# Patient Record
Sex: Female | Born: 1955
Health system: Southern US, Community
[De-identification: ages and names within clinical notes are randomized; demographics above are authoritative.]

## PROBLEM LIST (undated history)

## (undated) DIAGNOSIS — T7840XA Allergy, unspecified, initial encounter: Secondary | ICD-10-CM

## (undated) DIAGNOSIS — Z923 Personal history of irradiation: Secondary | ICD-10-CM

## (undated) DIAGNOSIS — K219 Gastro-esophageal reflux disease without esophagitis: Secondary | ICD-10-CM

## (undated) DIAGNOSIS — M1990 Unspecified osteoarthritis, unspecified site: Secondary | ICD-10-CM

## (undated) DIAGNOSIS — C50919 Malignant neoplasm of unspecified site of unspecified female breast: Secondary | ICD-10-CM

## (undated) DIAGNOSIS — C50911 Malignant neoplasm of unspecified site of right female breast: Secondary | ICD-10-CM

## (undated) DIAGNOSIS — Z8489 Family history of other specified conditions: Secondary | ICD-10-CM

## (undated) DIAGNOSIS — D649 Anemia, unspecified: Secondary | ICD-10-CM

## (undated) DIAGNOSIS — R21 Rash and other nonspecific skin eruption: Secondary | ICD-10-CM

## (undated) DIAGNOSIS — Z9221 Personal history of antineoplastic chemotherapy: Secondary | ICD-10-CM

## (undated) DIAGNOSIS — Z853 Personal history of malignant neoplasm of breast: Secondary | ICD-10-CM

## (undated) DIAGNOSIS — I429 Cardiomyopathy, unspecified: Secondary | ICD-10-CM

## (undated) DIAGNOSIS — R0989 Other specified symptoms and signs involving the circulatory and respiratory systems: Secondary | ICD-10-CM

## (undated) DIAGNOSIS — Z98811 Dental restoration status: Secondary | ICD-10-CM

## (undated) DIAGNOSIS — F419 Anxiety disorder, unspecified: Secondary | ICD-10-CM

## (undated) DIAGNOSIS — R011 Cardiac murmur, unspecified: Secondary | ICD-10-CM

## (undated) HISTORY — DX: Allergy, unspecified, initial encounter: T78.40XA

## (undated) HISTORY — PX: AUGMENTATION MAMMAPLASTY: SUR837

## (undated) HISTORY — PX: MASTECTOMY: SHX3

## (undated) HISTORY — PX: COLONOSCOPY: SHX174

## (undated) HISTORY — DX: Unspecified osteoarthritis, unspecified site: M19.90

## (undated) HISTORY — PX: REDUCTION MAMMAPLASTY: SUR839

---

## 1998-10-08 ENCOUNTER — Other Ambulatory Visit: Admission: RE | Admit: 1998-10-08 | Discharge: 1998-10-08 | Payer: Self-pay | Admitting: Obstetrics & Gynecology

## 2000-06-28 ENCOUNTER — Other Ambulatory Visit: Admission: RE | Admit: 2000-06-28 | Discharge: 2000-06-28 | Payer: Self-pay | Admitting: Obstetrics & Gynecology

## 2001-08-10 ENCOUNTER — Other Ambulatory Visit: Admission: RE | Admit: 2001-08-10 | Discharge: 2001-08-10 | Payer: Self-pay | Admitting: Obstetrics & Gynecology

## 2003-01-11 ENCOUNTER — Other Ambulatory Visit: Admission: RE | Admit: 2003-01-11 | Discharge: 2003-01-11 | Payer: Self-pay | Admitting: Obstetrics and Gynecology

## 2003-04-21 HISTORY — PX: BREAST ENHANCEMENT SURGERY: SHX7

## 2004-01-17 ENCOUNTER — Other Ambulatory Visit: Admission: RE | Admit: 2004-01-17 | Discharge: 2004-01-17 | Payer: Self-pay | Admitting: Obstetrics and Gynecology

## 2005-03-24 ENCOUNTER — Other Ambulatory Visit: Admission: RE | Admit: 2005-03-24 | Discharge: 2005-03-24 | Payer: Self-pay | Admitting: Obstetrics and Gynecology

## 2008-07-18 ENCOUNTER — Encounter: Admission: RE | Admit: 2008-07-18 | Discharge: 2008-07-18 | Payer: Self-pay | Admitting: Obstetrics and Gynecology

## 2010-05-11 ENCOUNTER — Encounter: Payer: Self-pay | Admitting: Obstetrics and Gynecology

## 2011-01-20 ENCOUNTER — Telehealth: Payer: Self-pay | Admitting: Internal Medicine

## 2011-01-20 NOTE — Telephone Encounter (Signed)
Spoke with patient and she states she has has bright, red blood with and without stools from rectum x 3 days. Denies constipation, diarrhea, pain, bloating, change in bowel habits. Offered patient an OV with Willette Cluster, NP tomorrow but patient cannot come. Scheduled on 01/22/11 at 8:30 AM with Willette Cluster, NP.Last colon- 08/24/03- diverticulosis, hemorrhoids.

## 2011-01-20 NOTE — Telephone Encounter (Signed)
I don't see anything in terms of procedure or [facility] records. OK to see paula.

## 2011-01-21 ENCOUNTER — Encounter: Payer: Self-pay | Admitting: Internal Medicine

## 2011-01-22 ENCOUNTER — Encounter: Payer: Self-pay | Admitting: Nurse Practitioner

## 2011-01-22 ENCOUNTER — Ambulatory Visit (INDEPENDENT_AMBULATORY_CARE_PROVIDER_SITE_OTHER): Payer: BC Managed Care – PPO | Admitting: Nurse Practitioner

## 2011-01-22 VITALS — BP 88/70 | HR 88 | Ht 63.0 in | Wt 132.0 lb

## 2011-01-22 DIAGNOSIS — K648 Other hemorrhoids: Secondary | ICD-10-CM

## 2011-01-22 DIAGNOSIS — R195 Other fecal abnormalities: Secondary | ICD-10-CM

## 2011-01-22 DIAGNOSIS — K625 Hemorrhage of anus and rectum: Secondary | ICD-10-CM

## 2011-01-22 MED ORDER — HYDROCORTISONE ACETATE 25 MG RE SUPP: 25.0000 mg | Freq: Every day | RECTAL | Status: AC

## 2011-01-22 NOTE — Progress Notes (Signed)
01/22/2011 Patricia Chandler [id number] Jan 02, 1956   HISTORY OF PRESENT ILLNESS: Patient is a 55 year old female who has been a colonoscopy 2005 with findings of diverticulosis and internal hemorrhoids only. Patient started Valtrex last week, subsequently developed loose stool followed by some rectal bleeding. No abdominal pain or rectal pain. Since she made this appointment patient actually hasn't had any further bleeding and her bowel movements seem to be normalizing.   Past Medical History  Diagnosis Date  . Genital herpes   . Diverticulosis   . Internal hemorrhoid    Past Surgical History  Procedure Date  . Breast lumpectomy     right    reports that she has never smoked. She has never used smokeless tobacco. She reports that she drinks about 7 ounces of alcohol per week. She reports that she does not use illicit drugs. family history includes Breast cancer in her maternal aunt and paternal grandmother; Cancer in her paternal grandmother; Hypertension in her father; and Stroke in her father. No Known Allergies    Outpatient Encounter Prescriptions as of 01/22/2011  Medication Sig Dispense Refill  . valACYclovir (VALTREX) 500 MG tablet Take 500 mg by mouth 2 (two) times daily.           REVIEW OF SYSTEMS  : All other systems reviewed and negative except where noted in the History of Present Illness.   PHYSICAL EXAM: General: Well developed white female in no acute distress Head: Normocephalic and atraumatic Eyes:  sclerae anicteric,conjunctive pink. Ears: Normal auditory acuity Mouth: No deformity or lesions Neck: Supple, no masses.  Lungs: Clear throughout to auscultation Heart: Regular rate and rhythm; no murmurs heard Abdomen: Soft, non distended, nontender. No masses or hepatomegaly noted. Normal Bowel sounds Rectal: No external lesions. Inflamed internal hemorrhoid of anoscopy Musculoskeletal: Symmetrical with no gross deformities  Skin: No lesions on visible  extremities Extremities: No edema or deformities noted Neurological: Alert oriented x 4, grossly nonfocal Cervical Nodes:  No significant cervical adenopathy Psychological:  Alert and cooperative. Normal mood and affect  ASSESSMENT AND PLAN;

## 2011-01-22 NOTE — Patient Instructions (Signed)
Your prescription for Anusol [facility] suppositories has been given to you. Take Imodium 1-2 tablets by mouth once daily as needed.  Call us back if you have any further bleeding or new abdominal symptoms.

## 2011-01-26 ENCOUNTER — Encounter: Payer: Self-pay | Admitting: Nurse Practitioner

## 2011-01-26 DIAGNOSIS — R195 Other fecal abnormalities: Secondary | ICD-10-CM | POA: Insufficient documentation

## 2011-01-26 DIAGNOSIS — K648 Other hemorrhoids: Secondary | ICD-10-CM | POA: Insufficient documentation

## 2011-01-26 NOTE — Assessment & Plan Note (Addendum)
Loose stools after beginning Valtrex. No recent antibiotics. Her stools seem to be normalizing now. If she has recurrent loose stools we will need to obtain stool studies.

## 2011-01-26 NOTE — Assessment & Plan Note (Addendum)
Suspect the loose stools (improving) aggravated internal hemorrhoids. Begin Anusol HC suppositories. She will let us know if there is a recurrence of loose stools.

## 2011-01-26 NOTE — Progress Notes (Signed)
Reviewed and agree with management. Seann Genther D. Karis Rilling, M.D., FACG  

## 2011-01-26 NOTE — Assessment & Plan Note (Addendum)
Most likely secondary to internal hemorrhoids seen on anoscopy.  Begin Anusol suppositories.  Will call patient next week to get an update.

## 2012-01-31 ENCOUNTER — Encounter (HOSPITAL_COMMUNITY): Payer: Self-pay | Admitting: Emergency Medicine

## 2012-01-31 ENCOUNTER — Emergency Department (HOSPITAL_COMMUNITY)
Admission: EM | Admit: 2012-01-31 | Discharge: 2012-01-31 | Disposition: A | Discharge status: Home / Self Care | Payer: Self-pay | Source: Home / Self Care

## 2012-01-31 DIAGNOSIS — J40 Bronchitis, not specified as acute or chronic: Secondary | ICD-10-CM

## 2012-01-31 DIAGNOSIS — R0982 Postnasal drip: Secondary | ICD-10-CM

## 2012-01-31 DIAGNOSIS — J45909 Unspecified asthma, uncomplicated: Secondary | ICD-10-CM

## 2012-01-31 DIAGNOSIS — J040 Acute laryngitis: Secondary | ICD-10-CM

## 2012-01-31 MED ORDER — IPRATROPIUM BROMIDE 0.02 % IN SOLN
0.5000 mg | Freq: Once | RESPIRATORY_TRACT | Status: AC
  Administered 2012-01-31: 0.5 mg via RESPIRATORY_TRACT

## 2012-01-31 MED ORDER — ALBUTEROL SULFATE (5 MG/ML) 0.5% IN NEBU
2.5000 mg | INHALATION_SOLUTION | Freq: Once | RESPIRATORY_TRACT | Status: AC
  Administered 2012-01-31: 2.5 mg via RESPIRATORY_TRACT

## 2012-01-31 MED ORDER — ALBUTEROL SULFATE (5 MG/ML) 0.5% IN NEBU
INHALATION_SOLUTION | RESPIRATORY_TRACT | Status: AC
  Filled 2012-01-31: qty 0.5

## 2012-01-31 MED ORDER — ALBUTEROL SULFATE HFA 108 (90 BASE) MCG/ACT IN AERS: 2.0000 | INHALATION_SPRAY | RESPIRATORY_TRACT | Status: DC | PRN

## 2012-01-31 MED ORDER — TRIAMCINOLONE ACETONIDE 40 MG/ML IJ SUSP
40.0000 mg | Freq: Once | INTRAMUSCULAR | Status: AC
  Administered 2012-01-31: 40 mg via INTRAMUSCULAR

## 2012-01-31 MED ORDER — METHYLPREDNISOLONE 4 MG PO KIT: PACK | ORAL | Status: DC

## 2012-01-31 MED ORDER — TRIAMCINOLONE ACETONIDE 40 MG/ML IJ SUSP
INTRAMUSCULAR | Status: AC
  Filled 2012-01-31: qty 5

## 2012-01-31 NOTE — ED Notes (Signed)
Pt c/o productive cough, fatigue, and wheezing x 1 wk. Pt did have diarrhea x 3 days but has cleared. Pt denies any other symptoms  Has tried mucinex with little relief. Pt states that she has a history of pneumonia.

## 2012-01-31 NOTE — ED Provider Notes (Signed)
History     CSN: [id number]  Arrival date & time 01/31/12  1730   None     Chief Complaint  Patient presents with  . URI    (Consider location/radiation/quality/duration/timing/severity/associated sxs/prior treatment) HPI Comments: Pleasant 56 year old female who presents with URI symptoms. She's had a cough and chest congestion for one week. She's lost her voice and she has some mild shortness of breath. She denies earache or sore throat. She states that she's had a low-grade fever but only because she's felt hot and cold. She has not actually measured her temperature. She does not smoke nor does she have a history of asthma or COPD.  Patient is a 56 y.o. female presenting with URI.  URI The primary symptoms include fever, cough and wheezing. Primary symptoms do not include fatigue, sore throat or rash.  Symptoms associated with the illness include chills, congestion and rhinorrhea. The illness is not associated with sinus pressure.    Past Medical History  Diagnosis Date  . Genital herpes   . Diverticulosis   . Internal hemorrhoid     Past Surgical History  Procedure Date  . Breast lumpectomy     right    Family History  Problem Relation Age of Onset  . Breast cancer Paternal Grandmother   . Breast cancer Maternal Aunt   . Cancer Paternal Grandmother     abdominal  . Stroke Father   . Hypertension Father     History  Substance Use Topics  . Smoking status: Never Smoker   . Smokeless tobacco: Never Used  . Alcohol Use: 7.0 oz/week    14 drink(s) per week     2 drinks per day    OB History    Grav Para Term Preterm Abortions TAB SAB Ect Mult Living                  Review of Systems  Constitutional: Positive for fever and chills. Negative for activity change, appetite change and fatigue.  HENT: Positive for congestion, rhinorrhea, voice change and postnasal drip. Negative for sore throat, facial swelling, neck pain, neck stiffness, sinus pressure and  ear discharge.   Eyes: Negative.   Respiratory: Positive for cough, shortness of breath and wheezing.   Cardiovascular: Negative.   Gastrointestinal: Negative.   Skin: Negative for pallor and rash.  Neurological: Negative.     Allergies  Review of patient's allergies indicates no known allergies.  Home Medications   Current Outpatient Rx  Name Route Sig Dispense Refill  . ALBUTEROL SULFATE HFA 108 (90 BASE) MCG/ACT IN AERS Inhalation Inhale 2 puffs into the lungs every 4 (four) hours as needed for wheezing. 1 Inhaler 0  . METHYLPREDNISOLONE 4 MG PO KIT  As directed 21 tablet 0  . VALACYCLOVIR HCL 500 MG PO TABS Oral Take 500 mg by mouth 2 (two) times daily.        BP 119/78  Pulse 82  Temp 98.1 F (36.7 C) (Oral)  Resp 20  SpO2 98%  Physical Exam  Constitutional: She is oriented to person, place, and time. She appears well-developed and well-nourished. No distress.  HENT:  Head: Normocephalic and atraumatic.  Right Ear: External ear normal.  Left Ear: External ear normal.  Mouth/Throat: No oropharyngeal exudate.       Oropharynx with patchy erythema and cobblestoning. No exudates.  Eyes: EOM are normal. Pupils are equal, round, and reactive to light. Right eye exhibits no discharge. Left eye exhibits no discharge.  Neck:  Normal range of motion. Neck supple.  Cardiovascular: Normal rate, regular rhythm and normal heart sounds.   Pulmonary/Chest: Effort normal. No respiratory distress. She has wheezes. She has no rales.       Bilateral wheezes with prolonged expiratory phase.  Musculoskeletal: Normal range of motion. She exhibits no edema.  Lymphadenopathy:    She has no cervical adenopathy.  Neurological: She is alert and oriented to person, place, and time.  Skin: Skin is warm and dry. No rash noted.  Psychiatric: She has a normal mood and affect.    ED Course  Procedures (including critical care time)  Labs Reviewed - No data to display No results found.   1.  Post-nasal drip   2. Laryngitis   3. Bronchitis   4. RAD (reactive airway disease)       MDM  Duo neb: Showed modest results from the Internet. Still with wheezing bilaterally. She states critical homes we'll discharge her with medications as instructed. He is to return for any worsening development of fever or trouble breathing or other symptoms. Medrol Dosepak Albuterol HFA 2 puffs every 4 hours when necessary cough and wheeze Mucinex DM each bedtime when necessary Out of work for the next 3 days. Kenalog 40mg  IM now Chlor-Trimeton/chlorpheniramine 2-4 mg every 4-6 hours when necessary drainage.        Hayden Rasmussen, NP 01/31/12 1930  Hayden Rasmussen, NP 01/31/12 1943

## 2012-02-01 NOTE — ED Provider Notes (Signed)
Medical screening examination/treatment/procedure(s) were performed by resident physician or non-physician practitioner and as supervising physician I was immediately available for consultation/collaboration.   Barkley Bruns MD.    Linna Hoff, MD 02/01/12 [phone]

## 2012-02-04 ENCOUNTER — Encounter (HOSPITAL_COMMUNITY): Payer: Self-pay | Admitting: Emergency Medicine

## 2012-02-04 ENCOUNTER — Emergency Department (HOSPITAL_COMMUNITY)
Admission: EM | Admit: 2012-02-04 | Discharge: 2012-02-04 | Disposition: A | Discharge status: Home / Self Care | Payer: PRIVATE HEALTH INSURANCE | Source: Home / Self Care | Attending: Emergency Medicine | Admitting: Emergency Medicine

## 2012-02-04 ENCOUNTER — Emergency Department (INDEPENDENT_AMBULATORY_CARE_PROVIDER_SITE_OTHER): Payer: PRIVATE HEALTH INSURANCE

## 2012-02-04 DIAGNOSIS — J189 Pneumonia, unspecified organism: Secondary | ICD-10-CM

## 2012-02-04 LAB — POCT I-STAT, CHEM 8
BUN: 20 mg/dL (ref 6–23)
Chloride: 104 mEq/L (ref 96–112)
Creatinine, Ser: 0.9 mg/dL (ref 0.50–1.10)
Sodium: 138 mEq/L (ref 135–145)

## 2012-02-04 MED ORDER — CEFTRIAXONE SODIUM 1 G IJ SOLR
INTRAMUSCULAR | Status: AC
  Filled 2012-02-04: qty 10

## 2012-02-04 MED ORDER — LIDOCAINE HCL (PF) 1 % IJ SOLN
INTRAMUSCULAR | Status: AC
  Filled 2012-02-04: qty 5

## 2012-02-04 MED ORDER — AZITHROMYCIN 250 MG PO TABS: ORAL_TABLET | ORAL | Status: DC

## 2012-02-04 MED ORDER — CEFTRIAXONE SODIUM 1 G IJ SOLR
1.0000 g | Freq: Once | INTRAMUSCULAR | Status: AC
  Administered 2012-02-04: 1 g via INTRAMUSCULAR

## 2012-02-04 NOTE — ED Notes (Signed)
Patient reported she was seen 10/13 and diagnosed with bronchitis.  Patient reports she does not feel she is getting any better.

## 2012-02-05 NOTE — ED Provider Notes (Signed)
Chief Complaint  Patient presents with  . Bronchitis    History of Present Illness:   Patricia Chandler is a 56 year old female who has had an 11 day history beginning with fatigue, anorexia, cough productive of clear sputum, wheezing, chest tightness, and chest congestion. She's had a low-grade fever, post nasal drip, hoarseness, headache, diarrhea, abdominal pain. She was seen here this past Sunday, 5 days ago and diagnosed as having bronchitis. She was placed in an albuterol inhaler and a prednisone taper. She feels like she's not getting any better. Her biggest problem has been the fatigue and tiredness. 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  Review of Systems:  Other than noted above, the patient denies any of the following symptoms. Systemic:  No fever, chills, sweats, fatigue, myalgias, headache, or anorexia. Eye:  No redness, pain or drainage. ENT:  No earache, ear congestion, nasal congestion, sneezing, rhinorrhea, sinus pressure, sinus pain, post nasal drip, or sore throat. Lungs:  No cough, sputum production, wheezing, shortness of breath, or chest pain. GI:  No abdominal pain, nausea, vomiting, or diarrhea.  PMFSH:  Past medical history, family history, social history, meds, and allergies were reviewed.  Physical Exam:   Vital signs:  BP 124/89  Pulse 104  Temp 98.5 F (36.9 C) (Oral)  Resp 16  SpO2 100% General:  Alert, in no distress. Eye:  No conjunctival injection or drainage. Lids were normal. ENT:  TMs and canals were normal, without erythema or inflammation.  Nasal mucosa was clear and uncongested, without drainage.  Mucous membranes were moist.  Pharynx was clear, without exudate or drainage.  There were no oral ulcerations or lesions. Neck:  Supple, no adenopathy, tenderness or mass. Lungs:  No respiratory distress.  She has scattered wheezes, rales, and rhonchi throughout her entire left lung  both anteriorly and posteriorly.  Heart:  Regular rhythm, without gallops, murmers or rubs. Skin:  Clear, warm, and dry, without rash or lesions.  Labs:   Results for orders placed during the hospital encounter of 02/04/12  POCT I-STAT, CHEM 8      Component Value Range   Sodium 138  135 - 145 mEq/L   Potassium 4.0  3.5 - 5.1 mEq/L   Chloride 104  96 - 112 mEq/L   BUN 20  6 - 23 mg/dL   Creatinine, Ser 1.61  0.50 - 1.10 mg/dL   Glucose, Bld 096 (*) 70 - 99 mg/dL   Calcium, Ion 0.45  4.09 - 1.23 mmol/L   TCO2 26  0 - 100 mmol/L   Hemoglobin 16.7 (*) 12.0 - 15.0 g/dL   HCT 81.1 (*) 91.4 - 78.2 %    Radiology:  Dg Chest 2 View  02/04/2012  *RADIOLOGY REPORT*  Clinical Data: Cough and fatigue.  CHEST - 2 VIEW  Comparison: None.  Findings: Infiltrate is seen in the medial retrocardiac left lower lobe, suspicious for bronchopneumonia.  The right lung is clear. No evidence of pleural effusion.  Heart size is normal.  No mass or lymphadenopathy identified.  IMPRESSION: Mild retrocardiac left lower lobe infiltrate, suspicious for bronchopneumonia.   Original Report Authenticated By: Danae Orleans, M.D.      I reviewed the images independently and personally and concur with the radiologist's findings.  Course in Urgent Care Center:   She was given Rocephin 1 g IM and tolerated this well without any immediate side  effects.  Assessment:  The encounter diagnosis was Community acquired pneumonia.  Plan:   1.  The following meds were prescribed:   New Prescriptions   AZITHROMYCIN (ZITHROMAX) 250 MG TABLET    Take 2 tabs on day 1, then 1 daily for 9 days.   2.  The patient was instructed in symptomatic care and handouts were given. 3.  The patient was told to return if becoming worse in any way, if no better in 3 or 4 days, and given some red flag symptoms that would indicate earlier return.  Follow up:  The patient was told to follow up here in 48 hours for recheck, I also suggested that she  get a repeat chest x-ray in one month.    Reuben Likes, MD 02/05/12 [phone]

## 2012-03-09 ENCOUNTER — Other Ambulatory Visit: Payer: Self-pay | Admitting: Obstetrics and Gynecology

## 2012-03-09 DIAGNOSIS — R928 Other abnormal and inconclusive findings on diagnostic imaging of breast: Secondary | ICD-10-CM

## 2012-03-16 ENCOUNTER — Other Ambulatory Visit: Payer: PRIVATE HEALTH INSURANCE

## 2012-03-22 ENCOUNTER — Other Ambulatory Visit: Payer: PRIVATE HEALTH INSURANCE

## 2012-03-30 ENCOUNTER — Ambulatory Visit
Admission: RE | Admit: 2012-03-30 | Discharge: 2012-03-30 | Disposition: A | Discharge status: Home / Self Care | Payer: PRIVATE HEALTH INSURANCE | Source: Ambulatory Visit | Attending: Obstetrics and Gynecology | Admitting: Obstetrics and Gynecology

## 2012-03-30 DIAGNOSIS — R928 Other abnormal and inconclusive findings on diagnostic imaging of breast: Secondary | ICD-10-CM

## 2012-06-04 ENCOUNTER — Other Ambulatory Visit: Payer: Self-pay

## 2012-08-17 ENCOUNTER — Other Ambulatory Visit: Payer: Self-pay | Admitting: Family Medicine

## 2012-08-17 DIAGNOSIS — R220 Localized swelling, mass and lump, head: Secondary | ICD-10-CM

## 2012-08-19 ENCOUNTER — Ambulatory Visit
Admission: RE | Admit: 2012-08-19 | Discharge: 2012-08-19 | Disposition: A | Discharge status: Home / Self Care | Payer: BC Managed Care – PPO | Source: Ambulatory Visit | Attending: Family Medicine | Admitting: Family Medicine

## 2012-08-19 DIAGNOSIS — R220 Localized swelling, mass and lump, head: Secondary | ICD-10-CM

## 2013-02-23 ENCOUNTER — Other Ambulatory Visit: Payer: Self-pay

## 2013-05-05 ENCOUNTER — Other Ambulatory Visit: Payer: Self-pay | Admitting: Obstetrics and Gynecology

## 2013-05-05 DIAGNOSIS — R928 Other abnormal and inconclusive findings on diagnostic imaging of breast: Secondary | ICD-10-CM

## 2013-05-17 ENCOUNTER — Other Ambulatory Visit: Payer: Self-pay | Admitting: Obstetrics and Gynecology

## 2013-05-17 ENCOUNTER — Ambulatory Visit
Admission: RE | Admit: 2013-05-17 | Discharge: 2013-05-17 | Disposition: A | Discharge status: Home / Self Care | Payer: BC Managed Care – PPO | Source: Ambulatory Visit | Attending: Obstetrics and Gynecology | Admitting: Obstetrics and Gynecology

## 2013-05-17 DIAGNOSIS — R921 Mammographic calcification found on diagnostic imaging of breast: Secondary | ICD-10-CM

## 2013-05-17 DIAGNOSIS — R928 Other abnormal and inconclusive findings on diagnostic imaging of breast: Secondary | ICD-10-CM

## 2013-05-25 ENCOUNTER — Ambulatory Visit
Admission: RE | Admit: 2013-05-25 | Discharge: 2013-05-25 | Disposition: A | Discharge status: Home / Self Care | Payer: BC Managed Care – PPO | Source: Ambulatory Visit | Attending: Obstetrics and Gynecology | Admitting: Obstetrics and Gynecology

## 2013-05-25 DIAGNOSIS — R921 Mammographic calcification found on diagnostic imaging of breast: Secondary | ICD-10-CM

## 2013-07-06 ENCOUNTER — Encounter: Payer: Self-pay | Admitting: Internal Medicine

## 2014-07-27 ENCOUNTER — Encounter: Payer: Self-pay | Admitting: Internal Medicine

## 2014-09-11 ENCOUNTER — Ambulatory Visit (AMBULATORY_SURGERY_CENTER): Payer: Self-pay

## 2014-09-11 VITALS — Ht 63.0 in | Wt 160.6 lb

## 2014-09-11 DIAGNOSIS — Z1211 Encounter for screening for malignant neoplasm of colon: Secondary | ICD-10-CM

## 2014-09-11 NOTE — Progress Notes (Signed)
Per pt, no allergies to soy or egg products.Pt not taking any weight loss meds or using  O2 at home. 

## 2014-09-12 ENCOUNTER — Encounter: Payer: Self-pay | Admitting: Internal Medicine

## 2014-10-02 ENCOUNTER — Encounter: Payer: Self-pay | Admitting: Internal Medicine

## 2014-10-02 ENCOUNTER — Ambulatory Visit (AMBULATORY_SURGERY_CENTER): Payer: BLUE CROSS/BLUE SHIELD | Admitting: Internal Medicine

## 2014-10-02 VITALS — BP 106/70 | HR 77 | Temp 97.3°F | Resp 16 | Ht 63.0 in | Wt 160.0 lb

## 2014-10-02 DIAGNOSIS — Z1211 Encounter for screening for malignant neoplasm of colon: Secondary | ICD-10-CM

## 2014-10-02 HISTORY — PX: COLONOSCOPY WITH PROPOFOL: SHX5780

## 2014-10-02 MED ORDER — SODIUM CHLORIDE 0.9 % IV SOLN: 500.0000 mL | INTRAVENOUS | Status: DC

## 2014-10-02 NOTE — Patient Instructions (Signed)
Diverticulosis seen today. Handouts given on diverticulosis and high fiber diet.  Repeat colonoscopy in 10 years. Call us with any questions or concerns. Thank you.   YOU HAD AN ENDOSCOPIC PROCEDURE TODAY AT [facility]:   Refer to the procedure report that was given to you for any specific questions about what was found during the examination.  If the procedure report does not answer your questions, please call your gastroenterologist to clarify.  If you requested that your care partner not be given the details of your procedure findings, then the procedure report has been included in a sealed envelope for you to review at your convenience later.  YOU SHOULD EXPECT: Some feelings of bloating in the abdomen. Passage of more gas than usual.  Walking can help get rid of the air that was put into your GI tract during the procedure and reduce the bloating. If you had a lower endoscopy (such as a colonoscopy or flexible sigmoidoscopy) you may notice spotting of blood in your stool or on the toilet paper. If you underwent a bowel prep for your procedure, you may not have a normal bowel movement for a few days.  Please Note:  You might notice some irritation and congestion in your nose or some drainage.  This is from the oxygen used during your procedure.  There is no need for concern and it should clear up in a day or so.  SYMPTOMS TO REPORT IMMEDIATELY:   Following lower endoscopy (colonoscopy or flexible sigmoidoscopy):  Excessive amounts of blood in the stool  Significant tenderness or worsening of abdominal pains  Swelling of the abdomen that is new, acute  Fever of 100F or higher   For urgent or emergent issues, a gastroenterologist can be reached at any hour by calling [phone].   DIET: Your first meal following the procedure should be a small meal and then it is ok to progress to your normal diet. Heavy or fried foods are harder to digest and may make you feel nauseous  or bloated.  Likewise, meals heavy in dairy and vegetables can increase bloating.  Drink plenty of fluids but you should avoid alcoholic beverages for 24 hours.  ACTIVITY:  You should plan to take it easy for the rest of today and you should NOT DRIVE or use heavy machinery until tomorrow (because of the sedation medicines used during the test).    FOLLOW UP: Our staff will call the number listed on your records the next business day following your procedure to check on you and address any questions or concerns that you may have regarding the information given to you following your procedure. If we do not reach you, we will leave a message.  However, if you are feeling well and you are not experiencing any problems, there is no need to return our call.  We will assume that you have returned to your regular daily activities without incident.  If any biopsies were taken you will be contacted by phone or by letter within the next 1-3 weeks.  Please call us at [phone] if you have not heard about the biopsies in 3 weeks.    SIGNATURES/CONFIDENTIALITY: You and/or your care partner have signed paperwork which will be entered into your electronic medical record.  These signatures attest to the fact that that the information above on your After Visit Summary has been reviewed and is understood.  Full responsibility of the confidentiality of this discharge information lies with you  and/or your care-partner. 

## 2014-10-02 NOTE — Progress Notes (Signed)
Report to PACU, RN, vss, BBS= Clear.  

## 2014-10-02 NOTE — Op Note (Signed)
[facility]  [address] [place], [id number]   COLONOSCOPY PROCEDURE REPORT  PATIENT: Patricia Chandler, Patricia Chandler  MR#: [id number] BIRTHDATE: 1955/09/07 , 59  yrs. old GENDER: female ENDOSCOPIST: Lafayette Dragon, MD REFERRED YI:[id number] Laurann Montana, M.D. PROCEDURE DATE:  10/02/2014 PROCEDURE:   Colonoscopy, screening First Screening Colonoscopy - Avg.  risk and is 50 yrs.  old or older - No.  Prior Negative Screening - Now for repeat screening. 10 or more years since last screening  History of Adenoma - Now for follow-up colonoscopy & has been > or = to 3 yrs.  N/A  Polyps removed today? No Recommend repeat exam, <10 yrs? No ASA CLASS:   Class II INDICATIONS:Screening for colonic neoplasia, Colorectal Neoplasm Risk Assessment for this procedure is average risk, and prior colonoscopy in May 2005.  Positive family history of colon polyps.  MEDICATIONS: Monitored anesthesia care and Propofol 300 mg IV  DESCRIPTION OF PROCEDURE:   After the risks benefits and alternatives of the procedure were thoroughly explained, informed consent was obtained.  The digital rectal exam revealed no abnormalities of the rectum.   The LB [id number]  endoscope was introduced through the anus and advanced to the cecum, which was identified by both the appendix and ileocecal valve. No adverse events experienced.   The quality of the prep was -------- Required for [facility]-------- excellent.  (MoviPrep was used)  The instrument was then slowly withdrawn as the colon was fully examined. Estimated blood loss is zero unless otherwise noted in this procedure report.      COLON FINDINGS: There was mild diverticulosis noted in the descending colon with associated angulation.  Retroflexed views revealed no abnormalities. The time to cecum = 6.11 Withdrawal time = 7.03   The scope was withdrawn and the procedure completed. COMPLICATIONS: There were no immediate complications.  ENDOSCOPIC  IMPRESSION: There was mild diverticulosis noted in the descending colon  RECOMMENDATIONS: High fiber diet Recall colonoscopy in 10 years  eSigned:  Lafayette Dragon, MD 10/02/2014 8:34 AM   cc:

## 2014-10-03 ENCOUNTER — Telehealth: Payer: Self-pay | Admitting: *Deleted

## 2014-10-03 NOTE — Telephone Encounter (Signed)
No answer. Left message to call if questions or concerns. 

## 2014-10-12 LAB — HM COLONOSCOPY

## 2015-04-21 DIAGNOSIS — Z853 Personal history of malignant neoplasm of breast: Secondary | ICD-10-CM

## 2015-04-21 HISTORY — DX: Personal history of malignant neoplasm of breast: Z85.3

## 2015-05-31 ENCOUNTER — Other Ambulatory Visit: Payer: Self-pay | Admitting: Obstetrics and Gynecology

## 2015-05-31 DIAGNOSIS — R928 Other abnormal and inconclusive findings on diagnostic imaging of breast: Secondary | ICD-10-CM

## 2015-06-06 ENCOUNTER — Ambulatory Visit
Admission: RE | Admit: 2015-06-06 | Discharge: 2015-06-06 | Disposition: A | Discharge status: Home / Self Care | Payer: BLUE CROSS/BLUE SHIELD | Source: Ambulatory Visit | Attending: Obstetrics and Gynecology | Admitting: Obstetrics and Gynecology

## 2015-06-06 DIAGNOSIS — R928 Other abnormal and inconclusive findings on diagnostic imaging of breast: Secondary | ICD-10-CM

## 2015-08-07 ENCOUNTER — Other Ambulatory Visit: Payer: Self-pay | Admitting: Obstetrics

## 2015-08-09 LAB — CYTOLOGY - PAP

## 2015-11-26 ENCOUNTER — Other Ambulatory Visit: Payer: Self-pay | Admitting: Obstetrics and Gynecology

## 2015-11-26 DIAGNOSIS — N6489 Other specified disorders of breast: Secondary | ICD-10-CM

## 2015-12-05 ENCOUNTER — Ambulatory Visit
Admission: RE | Admit: 2015-12-05 | Discharge: 2015-12-05 | Disposition: A | Discharge status: Home / Self Care | Payer: BLUE CROSS/BLUE SHIELD | Source: Ambulatory Visit | Attending: Obstetrics and Gynecology | Admitting: Obstetrics and Gynecology

## 2015-12-05 ENCOUNTER — Other Ambulatory Visit: Payer: Self-pay | Admitting: Obstetrics and Gynecology

## 2015-12-05 DIAGNOSIS — N6489 Other specified disorders of breast: Secondary | ICD-10-CM

## 2015-12-06 ENCOUNTER — Ambulatory Visit
Admission: RE | Admit: 2015-12-06 | Discharge: 2015-12-06 | Disposition: A | Discharge status: Home / Self Care | Payer: BLUE CROSS/BLUE SHIELD | Source: Ambulatory Visit | Attending: Obstetrics and Gynecology | Admitting: Obstetrics and Gynecology

## 2015-12-06 DIAGNOSIS — N6489 Other specified disorders of breast: Secondary | ICD-10-CM

## 2015-12-16 ENCOUNTER — Other Ambulatory Visit: Payer: Self-pay | Admitting: General Surgery

## 2015-12-16 DIAGNOSIS — D0591 Unspecified type of carcinoma in situ of right breast: Secondary | ICD-10-CM

## 2015-12-18 ENCOUNTER — Ambulatory Visit
Admission: RE | Admit: 2015-12-18 | Discharge: 2015-12-18 | Disposition: A | Discharge status: Home / Self Care | Payer: BLUE CROSS/BLUE SHIELD | Source: Ambulatory Visit | Attending: General Surgery | Admitting: General Surgery

## 2015-12-18 ENCOUNTER — Encounter: Payer: Self-pay | Admitting: Genetic Counselor

## 2015-12-18 ENCOUNTER — Ambulatory Visit (HOSPITAL_BASED_OUTPATIENT_CLINIC_OR_DEPARTMENT_OTHER): Payer: BLUE CROSS/BLUE SHIELD | Admitting: Genetic Counselor

## 2015-12-18 ENCOUNTER — Other Ambulatory Visit: Payer: BLUE CROSS/BLUE SHIELD

## 2015-12-18 DIAGNOSIS — C50919 Malignant neoplasm of unspecified site of unspecified female breast: Secondary | ICD-10-CM | POA: Diagnosis not present

## 2015-12-18 DIAGNOSIS — Z803 Family history of malignant neoplasm of breast: Secondary | ICD-10-CM | POA: Diagnosis not present

## 2015-12-18 DIAGNOSIS — Z315 Encounter for genetic counseling: Secondary | ICD-10-CM

## 2015-12-18 DIAGNOSIS — Z809 Family history of malignant neoplasm, unspecified: Secondary | ICD-10-CM | POA: Diagnosis not present

## 2015-12-18 DIAGNOSIS — D0591 Unspecified type of carcinoma in situ of right breast: Secondary | ICD-10-CM

## 2015-12-18 DIAGNOSIS — C50211 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 female breast: Secondary | ICD-10-CM | POA: Insufficient documentation

## 2015-12-18 MED ORDER — GADOBENATE DIMEGLUMINE 529 MG/ML IV SOLN
15.0000 mL | Freq: Once | INTRAVENOUS | Status: AC | PRN
Start: 2015-12-18 — End: 2015-12-18
  Administered 2015-12-18: 15 mL via INTRAVENOUS

## 2015-12-18 NOTE — Progress Notes (Signed)
Location of Breast Cancer: upper inner quadrant of right breast  Histology per Pathology Report:   12/06/15 Diagnosis 1. Breast, right, needle core biopsy, 2 o'clock 6 cm from nipple - INVASIVE MAMMARY CARCINOMA. - SEE COMMENT. 2. Breast, right, needle core biopsy, 2 o'clock 4 cm from nipple - INVASIVE DUCTAL CARCINOMA. - DUCTAL CARCINOMA IN SITU. - SEE COMMENT.  Receptor Status: sample 1: ER(90%), PR (2%), Her2-neu (positive), Ki-(80%), sample 2: ER(95%), PR (10%), Her2-neu (negative), Ki-(90%)  Did patient present with symptoms (if so, please note symptoms) or was this found on screening mammography?: screening mammogram - Patient reports calcifations have been monitored for about 2 years in her right breast.  Past/Anticipated interventions by surgeon, if any: biopsy on 12/06/15 - will meet with Dr. [name] next Thursday.  Past/Anticipated interventions by medical oncology, if any: apt on 12/20/15 with Dr. Lindi Adie  Lymphedema issues, if any:  no    Pain issues, if any:  no   OB Gyn history: 12 years with first menses, She has 2 children, she was 42 with birth of first child, she used bcp for 26 years, she had not used hormone replacement although she used an estrogen cream for a short time.  SAFETY ISSUES:  Prior radiation? no  Pacemaker/ICD? no  Possible current pregnancy?no  Is the patient on methotrexate? no  Current Complaints / other details:  Patient is here with her husband.  She has bilateral breast implants and also reports having osteopenia.  BP 126/82 (BP Location: Left Arm, Patient Position: Sitting)   Pulse 91   Temp 98.3 F (36.8 C) (Oral)   Ht _0  (1.6 m)   Wt 162 lb 6.4 oz (73.7 kg)   SpO2 100%   BMI 28.77 kg/m     Jacqulyn Liner, RN 12/18/2015,5:40 PM

## 2015-12-18 NOTE — Progress Notes (Signed)
REFERRING PROVIDER: Kelton Pillar, MD [address] [place], [place] [address]   Rolm Bookbinder, MD  Nicholas Lose, MD  PRIMARY PROVIDER:  Osborne Casco, MD  PRIMARY REASON FOR VISIT:  1. Family history of breast cancer   2. Malignant neoplasm of female breast, unspecified laterality, unspecified site of breast ([place])      HISTORY OF PRESENT ILLNESS:   Ms. Arcilla, a 60 y.o. female, was seen for a [place] cancer genetics consultation at the request of Dr. Laurann Montana due to a personal and family history of cancer.  Ms. Steidle presents to clinic today to discuss the possibility of a hereditary predisposition to cancer, genetic testing, and to further clarify her future cancer risks, as well as potential cancer risks for family members.   In August 2017, at the age of 37, Ms. Goar was diagnosed with cancer of the breast.  The tumor is triple positive. She was referred for genetic counseling to learn whether genetic testing is appropriate.     CANCER HISTORY:   No history exists.     HORMONAL RISK FACTORS:  Menarche was at age 73.  First live birth at age 72.  OCP use for approximately 26-28 years.  Ovaries intact: yes.  Hysterectomy: no.  Menopausal status: postmenopausal.  HRT use: 0 years. Colonoscopy: yes; normal. Mammogram within the last year: yes. Number of breast biopsies: 2. Up to date with pelvic exams:  yes. Any excessive radiation exposure in the past:  no  Past Medical History:  Diagnosis Date  . Diverticulosis   . Family history of breast cancer   . Genital herpes   . Internal hemorrhoid     Past Surgical History:  Procedure Laterality Date  . BREAST ENHANCEMENT SURGERY    . BREAST LUMPECTOMY     right/BENIGN    Social History   Social History  . Marital status: Married    Spouse name: N/A  . Number of children: 2  . Years of education: N/A   Occupational History  . medical receptionist    Social History Main  Topics  . Smoking status: Never Smoker  . Smokeless tobacco: Never Used  . Alcohol use 1.8 oz/week    3 Glasses of wine per week     Comment: 2 drinks per day  . Drug use: No  . Sexual activity: Not Asked   Other Topics Concern  . None   Social History Narrative  . None     FAMILY HISTORY:  We obtained a detailed, 4-generation family history.  Significant diagnoses are listed below: Family History  Problem Relation Age of Onset  . Breast cancer Maternal Aunt     dx in her 25s  . Stroke Father   . Hypertension Father   . Pulmonary fibrosis Mother   . Breast cancer Paternal Aunt 78  . Cancer Paternal Grandmother     abdominal; cervical vs uterine dx in her 31s  . Parkinson's disease Maternal Grandfather     The patient has two daughters and a granddaughter who are all cancer free.  She has two brothers, one who died in an accident at 68.  The other is cancer free. Her mother is alive at age 33, and her father died at 51 from a stroke.  Her mother had one sister who had breast cancer at age 73 and died at 26.  There is no other reported cancer on the maternal side of the family.  The patient's father had one  sister and one brother.  The sister was diagnosed with breast cancer at 78.  Her paternal grandmother was diagnosed with either cervical or uterine cancer in her 75's.  She had thought that she had breast cancer but her aunt confirmed that she did not have breast cancer but instead uterine or cervical.  Patient's maternal ancestors are of Caucasian descent, and paternal ancestors are of [place] and [place] descent. There is no reported Ashkenazi Jewish ancestry. There is no known consanguinity.  GENETIC COUNSELING ASSESSMENT: MAYOLA MCBAIN is a 60 y.o. female with a personal and family history of breast cancer which is somewhat suggestive of a familial pattern, but not hereditary pattern of cancer. We, therefore, discussed and recommended the following at today's visit.    DISCUSSION: We discussed that about 5-10% of breast cancer is hereditary with most cases due to BRCA mutations.  Looking at her family history I suspect that the risk for a BRCA mutation is low since all cancer is older ages, and there are relatively few individuals with cancer.  We discussed that if there is a hereditary mutation, it is more likely a moderate risk gene.  We discussed how management would be changed if she tested positive for either a BRCA mutation vs a moderate risk gene such as ATM, CHEK2 or PALB2.  Currently, Ms. Sprecher does not meet medical criteria for testing.  We did discuss out of pocket cost testing through [facility] and [facility].  [facility] will charge $250 and [facility] charges $259.95, for a multiple gene test.  Ms. Corbit indicated that she was willing to pay that for piece of mind for herself and her daughters.  She will go home and discuss this with her family and call if she decides to proceed with testing.  If she decides that she wants to undergo genetic testing we can set up a blood draw in the next couple days.  We reviewed the characteristics, features and inheritance patterns of hereditary cancer syndromes. We also discussed genetic testing, including the appropriate family members to test, the process of testing, insurance coverage and turn-around-time for results. We discussed the implications of a negative, positive and/or variant of uncertain significant result.   PLAN: Ms.Stephen's will call and let us know if she wants to pursue genetic testing.  We encouraged Ms. Cape to remain in contact with cancer genetics annually so that we can continuously update the family history and inform her of any changes in cancer genetics and testing that may be of benefit for this family.   Ms.  Salamone questions were answered to her satisfaction today. Our contact information was provided should additional questions or concerns arise. Thank you for the referral  and allowing [place] to share in the care of your patient.   Karen P. Florene Glen, [place], [facility] Certified Genetic Counselor [email or web address] phone: [phone]  The patient was seen for a total of 65 minutes in face-to-face genetic counseling.  This patient was discussed with Drs. Magrinat, Lindi Adie and/or Burr Medico who agrees with the above.    _______________________________________________________________________ For Office Staff:  Number of people involved in session: 1 Was an Intern/ student involved with case: no

## 2015-12-19 ENCOUNTER — Telehealth: Payer: Self-pay | Admitting: *Deleted

## 2015-12-19 ENCOUNTER — Encounter: Payer: Self-pay | Admitting: Radiation Oncology

## 2015-12-19 ENCOUNTER — Ambulatory Visit
Admission: RE | Admit: 2015-12-19 | Discharge: 2015-12-19 | Disposition: A | Discharge status: Home / Self Care | Payer: BLUE CROSS/BLUE SHIELD | Source: Ambulatory Visit | Attending: Radiation Oncology | Admitting: Radiation Oncology

## 2015-12-19 VITALS — BP 126/82 | HR 91 | Temp 98.3°F | Ht 63.0 in | Wt 162.4 lb

## 2015-12-19 DIAGNOSIS — C50211 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 female breast: Secondary | ICD-10-CM

## 2015-12-19 DIAGNOSIS — C50911 Malignant neoplasm of unspecified site of right female breast: Secondary | ICD-10-CM | POA: Insufficient documentation

## 2015-12-19 DIAGNOSIS — Z8249 Family history of ischemic heart disease and other diseases of the circulatory system: Secondary | ICD-10-CM | POA: Insufficient documentation

## 2015-12-19 DIAGNOSIS — Z803 Family history of malignant neoplasm of breast: Secondary | ICD-10-CM | POA: Insufficient documentation

## 2015-12-19 DIAGNOSIS — Z9889 Other specified postprocedural states: Secondary | ICD-10-CM | POA: Insufficient documentation

## 2015-12-19 DIAGNOSIS — Z79899 Other long term (current) drug therapy: Secondary | ICD-10-CM | POA: Insufficient documentation

## 2015-12-19 DIAGNOSIS — C50919 Malignant neoplasm of unspecified site of unspecified female breast: Secondary | ICD-10-CM

## 2015-12-19 DIAGNOSIS — Z5189 Encounter for other specified aftercare: Secondary | ICD-10-CM | POA: Diagnosis not present

## 2015-12-19 HISTORY — DX: Malignant neoplasm of unspecified site of unspecified female breast: C50.919

## 2015-12-19 NOTE — Progress Notes (Signed)
Radiation Oncology         [phone] ________________________________  Initial Outpatient Consultation  Name: Patricia Chandler MRN: [id number]  Date: 12/19/2015  DOB: 1955/09/24  OY:DXAJOIN,OMVEHM Theda Sers, MD  Rolm Bookbinder, MD   REFERRING PHYSICIAN: Rolm Bookbinder, MD  DIAGNOSIS: Clinical T1c,N0,M0 multifocal invasive ductal carcinoma of the right breast  HISTORY OF PRESENT ILLNESS::Patricia Chandler is a 60 y.o. female who had a screening mammogram on 05/28/15 showing a possible right breast mass. Diagnostic mammogram on 06/06/15 showed that this is probably benign post biopsy change/seroma within the right inner breast, at posterior depth, with probable associated fibrocystic change per the pathology report (stereotactic biopsy on 05/25/13). She was recommended to follow up with another diagnostic mammogram in 6 months.  Diagnostic mammogram and ultrasound of the right breast were performed on 12/05/15. Mammogram showed a 1.5 cm mass in the right breast 2:00 position approximately 4 cm from the nipple with a clip present, a 1.5 cm mass located posterior and slightly superior to this mass, and a stable intramammary lymph node located in the UOQ of the right breast. On physical exam, a palpable mass was noted in the right breast 2:00 position 4 cm from the nipple. Ultrasound noted a 1.7 X 1.5 x 1.3 cm hypoechoic mass in the right breast with increased vascularity in the 2:00 position 4 cm from the nipple, an adjacent satellite nodule measuring 0.7 cm located 0.7 cm superior to this mass, and a third hypoechoic mass in the right breast measuring 1.3 x 1.2 x 1.0 cm in the right breast 2:00 position 6 cm from the nipple. Ultrasound of the right axilla was negative for adenopathy and there was no evidence of internal mammary adenopathy.  The patient had two biopsies of the right breast on 12/06/15. Biopsy of the right breast (2 o'clock 6 cm from nipple) revealed grade 2-3 invasive ductal  carcinoma (ER 90% positive,PR 2% positive, HER2 positive, Ki67 80%). Biopsy of the right breast (2 o'clock 4 cm from nipple) revealed grade 3 invasive ductal carcinoma and DCIS (ER 95% positive, PR 10% positive, HER2 negative, Ki67 90%).  MRI of the bilateral breasts yesterday showed 3 adjacent enhancing masses in the right breast measuring 1.7 x 1.5 x 1.4 cm, 1.0 x 0.7 x 0.6 cm, and 1.4 x 1.3 x 1.3 cm. The masses span an area of 3.7 x 1.4 x 1.4 cm. The MRI also showed a 0.8 cm enhancing mass in the UOQ of the right breast corresponding to a stable intramammary lymph node. No masses were noted in the left breast.  The patient presented to genetic counseling yesterday due to a history of personal and family history of cancer. The counselor's assessment was that there is a somewhat suggestive familial pattern of cancer, but not a hereditary pattern of cancer. She currently does not meet the medical criteria for testing. However, she is interested in out of pocket cost testing and planned to discuss this with her family.  The patient presents today to discuss the role of radiation in the management of her disease. The patient has a history of bilateral breast implants and osteopenia. She reports the implants are subpectoral and have been in place for approximately 12 years.  PREVIOUS RADIATION THERAPY: No  PAST MEDICAL HISTORY:  has a past medical history of Breast cancer ([place]); Diverticulosis; Family history of breast cancer; Genital herpes; and Internal hemorrhoid.    PAST SURGICAL HISTORY: Past Surgical History:  Procedure Laterality Date  . BREAST ENHANCEMENT  SURGERY    . BREAST LUMPECTOMY     right/BENIGN    FAMILY HISTORY: family history includes Breast cancer in her maternal aunt; Breast cancer (age of onset: 37) in her paternal aunt; Cancer in her paternal grandmother; Hypertension in her father; Parkinson's disease in her maternal grandfather; Pulmonary fibrosis in her mother; Stroke in her  father.  SOCIAL HISTORY:  reports that she has never smoked. She has never used smokeless tobacco. She reports that she drinks about 1.8 oz of alcohol per week . She reports that she does not use drugs.  ALLERGIES: Review of patient's allergies indicates no known allergies.  MEDICATIONS:  Current Outpatient Prescriptions  Medication Sig Dispense Refill  . calcium carbonate (OS-CAL) 600 MG TABS tablet Take 600 mg by mouth daily.    . cholecalciferol (VITAMIN D) 1000 UNITS tablet Take 1,000 Units by mouth daily.    . magnesium 30 MG tablet Take 30 mg by mouth 2 (two) times daily.    . Turmeric POWD by Does not apply route.    . bisacodyl (DULCOLAX) 5 MG EC tablet Take 5 mg by mouth. Dulcolax 5 mg bowel prep #4-Take as directed    . polyethylene glycol powder (GLYCOLAX/MIRALAX) powder Take 1 Container by mouth once. Miralax bowel prep 238 gm-Take as directed     No current facility-administered medications for this encounter.     REVIEW OF SYSTEMS:  A 15 point review of systems is documented in the electronic medical record. This was obtained by the nursing staff. However, I reviewed this with the patient to discuss relevant findings and make appropriate changes.  Pertinent items are noted in HPI. No breast pain nipple discharge or bleeding prior to biopsy  The patient denies headaches or dizziness. Denies swelling of the right arm. Reports good range of motion of the right upper extremity.   PHYSICAL EXAM:  height is '5\' 3"'  (1.6 m) and weight is 162 lb 6.4 oz (73.7 kg). Her oral temperature is 98.3 F (36.8 C). Her blood pressure is 126/82 and her pulse is 91. Her oxygen saturation is 100%.   General: Alert and oriented, in no acute distress HEENT: Head is normocephalic. Extraocular movements are intact. Oropharynx is clear. Neck: Neck is supple, no palpable cervical or supraclavicular lymphadenopathy. Heart: Regular in rate and rhythm with no murmurs, rubs, or gallops. Chest: Clear to  auscultation bilaterally, with no rhonchi, wheezes, or rales. Abdomen: Soft, nontender, nondistended, with no rigidity or guarding. Extremities: No cyanosis or edema. Lymphatics: see Neck Exam Skin: No concerning lesions. Musculoskeletal: symmetric strength and muscle tone throughout. Neurologic: Cranial nerves II through XII are grossly intact. No obvious focalities. Speech is fluent. Coordination is intact. Psychiatric: Judgment and insight are intact. Affect is appropriate. Breast: Left breast no palpable mass or nipple discharge. Right breast bruising noted in the upper inner quadrant. Thickening noted in this area suspicious for nodules, but difficult to determine in light of the patient's bruising.  ECOG = 0  LABORATORY DATA:  Lab Results  Component Value Date   HGB 16.7 (H) 02/04/2012   HCT 49.0 (H) 02/04/2012   Lab Results  Component Value Date   NA 138 02/04/2012   K 4.0 02/04/2012   CL 104 02/04/2012   GLUCOSE 113 (H) 02/04/2012   CREATININE 0.90 02/04/2012      RADIOGRAPHY: Mr Breast Bilateral W Wo Contrast  Result Date: 12/18/2015 CLINICAL DATA:  Biopsy proven invasive mammary carcinoma in the right breast at 2 o'clock 6 cm  from the nipple and in the right breast at 2 o'clock 4 cm from the nipple. Diagnostic evaluation showed a third smaller mass located in between the the 2 biopsied masses. Patient had a stereotactic biopsy of the right breast on 05/25/2013 which showed fibrocystic changes with microcalcifications. LABS:  Creatinine was obtained on site at [facility] at [address] Results: Creatinine 0.7 mg/dL. EXAM: BILATERAL BREAST MRI WITH AND WITHOUT CONTRAST TECHNIQUE: Multiplanar, multisequence MR images of both breasts were obtained prior to and following the intravenous administration of 15 ml of MultiHance. THREE-DIMENSIONAL MR IMAGE RENDERING ON INDEPENDENT WORKSTATION: Three-dimensional MR images were rendered by post-processing of the original  MR data on an independent workstation. The three-dimensional MR images were interpreted, and findings are reported in the following complete MRI report for this study. Three dimensional images were evaluated at the independent DynaCad workstation COMPARISON:  Previous exam(s). Last bilateral diagnostic mammogram was performed 05/28/2015. FINDINGS: Breast composition: c. Heterogeneous fibroglandular tissue. Background parenchymal enhancement: Moderate. Right breast: In the upper-inner quadrant of the right breast are 3 adjacent enhancing masses measuring 1.7 x 1.5 x 1.4 cm, 1.0 x 0.7 x 0.6 cm and 1.4 x 1.3 x 1.3 cm. The masses span an area of 3.7 x 1.4 x 1.4 cm in anterior posterior, transverse and craniocaudal dimensions. Three signal void artifacts are seen in this area. There is a well-circumscribed 8 mm enhancing mass in the upper-outer quadrant of the right breast corresponding with an intramammary lymph node that has been stable since 2013. Left breast: No mass or abnormal enhancement. Lymph nodes: No abnormal appearing lymph nodes. Ancillary findings:  None. IMPRESSION: Three enhancing masses in the upper-inner quadrant of the right breast spanning an area of 3.7 cm corresponding with the biopsy proven invasive mammary carcinomas. RECOMMENDATION: Treatment planning of the right breast is recommended. Patient's left breast has not been imaged since February of 2017. I recommend diagnostic left mammogram prior to treatment planning. BI-RADS CATEGORY  6: Known biopsy-proven malignancy. Electronically Signed   By: Lillia Mountain M.D.   On: 12/18/2015 11:01   Mm Digital Diagnostic Unilat R  Result Date: 12/06/2015 CLINICAL DATA:  Post ultrasound-guided biopsy of 2 suspicious masses in the right breast at the 2 o'clock position. EXAM: DIAGNOSTIC RIGHT MAMMOGRAM POST ULTRASOUND BIOPSY COMPARISON:  Previous exam(s). FINDINGS: Mammographic images were obtained following ultrasound guided biopsy of 2 masses in the  upper inner right breast at the 2 o'clock position. A ribbon shaped biopsy marking clip is present in the mass at 2 o'clock 6 cm from the nipple in the upper inner right breast far posterior. A heart shaped biopsy marking clip is present in the mass at 2 o'clock 4 cm from nipple and adjacent to a coil shaped biopsy marking clip. IMPRESSION: 1. Ribbon shaped biopsy marking clip appropriately positioned in the mass at 2 o'clock 6 cm from nipple far posterior. 2. Heart shaped biopsy marking clip appropriately positioned in the mass at 2 o'clock 4 cm from nipple, adjacent to a coil shaped biopsy marking clip. Final Assessment: Post Procedure Mammograms for Marker Placement Electronically Signed   By: Everlean Alstrom M.D.   On: 12/06/2015 08:58   US Breast Ltd Uni Right Inc Axilla  Result Date: 12/05/2015 CLINICAL DATA:  Six month re-evaluation of right breast. The patient has a history of a benign right breast stereotactic biopsy on 05/25/2013. EXAM: 2D DIGITAL DIAGNOSTIC RIGHT MAMMOGRAM WITH CAD AND ADJUNCT TOMO ULTRASOUND RIGHT BREAST COMPARISON:  06/06/2015, 05/28/2015, 05/25/2013,  05/17/2013, 03/30/2012. ACR Breast Density Category c: The breast tissue is heterogeneously dense, which may obscure small masses. FINDINGS: There is an irregularly marginated 1.5 cm mass located within the right breast at the 2 o'clock position approximately 4 cm from the nipple with a central coil shaped clip present. In addition, there is a second more posteriorly located irregular mass 1.5 cm posterior and slightly superior to this mass. There is an intramammary lymph node located within the upper-outer quadrant of the right breast which appears stable when compared with the a right MLO view dated 03/30/2012. Mammographic images were processed with CAD. On physical exam, there is a small firm palpable mass located within the right breast at the 2 o'clock position 4 cm from the nipple. There is no palpable right axillary  adenopathy. Targeted ultrasound is performed, showing an irregularly marginated hypoechoic mass with increased vascularity located within the right breast at the 2 o'clock position 4 cm from the nipple measuring 1.7 x 1.5 x 1.3 cm in size. There is an adjacent smaller satellite nodule measuring 7 mm in size located 7 mm superior to this mass. In addition, there is a third irregularly marginated hypoechoic mass located within the right breast at the 2 o'clock position 6 cm from the nipple measuring 1.3 x 1.2 x 1.0 cm in size. This is located 1.2 cm superior to the larger mass. When measuring from the outer diameter of these 2 masses, the distance measures 3.9 cm. Tissue sampling via ultrasound-guided core biopsy of the 1.7 cm in size mass and the 1.3 cm mass is recommended. This is scheduled for 12/05/2015. Ultrasound of the right axilla demonstrates normal axillary contents and no evidence for adenopathy. Also, there is no ultrasound evidence for internal mammary adenopathy. IMPRESSION: 1. 1.7 cm suspicious mass located within the right breast at 2 o'clock position 4 cm from the nipple. Ultrasound-guided core biopsy is recommended. 2. 1.3 cm suspicious mass located within the right breast at 2 o'clock position 6 cm from the nipple. Ultrasound-guided core biopsy of this is also recommended. 3. 7 mm satellite nodule located between these 2 masses (7 mm superior to the larger mass). RECOMMENDATION: Right breast ultrasound-guided core biopsies as discussed above. I have discussed the findings and recommendations with the patient. Results were also provided in writing at the conclusion of the visit. If applicable, a reminder letter will be sent to the patient regarding the next appointment. BI-RADS CATEGORY  5: Highly suggestive of malignancy. Electronically Signed   By: Altamese Cabal M.D.   On: 12/05/2015 12:48   Mm Diag Breast W/implant Tomo Uni R  Result Date: 12/05/2015 CLINICAL DATA:  Six month re-evaluation  of right breast. The patient has a history of a benign right breast stereotactic biopsy on 05/25/2013. EXAM: 2D DIGITAL DIAGNOSTIC RIGHT MAMMOGRAM WITH CAD AND ADJUNCT TOMO ULTRASOUND RIGHT BREAST COMPARISON:  06/06/2015, 05/28/2015, 05/25/2013, 05/17/2013, 03/30/2012. ACR Breast Density Category c: The breast tissue is heterogeneously dense, which may obscure small masses. FINDINGS: There is an irregularly marginated 1.5 cm mass located within the right breast at the 2 o'clock position approximately 4 cm from the nipple with a central coil shaped clip present. In addition, there is a second more posteriorly located irregular mass 1.5 cm posterior and slightly superior to this mass. There is an intramammary lymph node located within the upper-outer quadrant of the right breast which appears stable when compared with the a right MLO view dated 03/30/2012. Mammographic images were processed with CAD. On physical  exam, there is a small firm palpable mass located within the right breast at the 2 o'clock position 4 cm from the nipple. There is no palpable right axillary adenopathy. Targeted ultrasound is performed, showing an irregularly marginated hypoechoic mass with increased vascularity located within the right breast at the 2 o'clock position 4 cm from the nipple measuring 1.7 x 1.5 x 1.3 cm in size. There is an adjacent smaller satellite nodule measuring 7 mm in size located 7 mm superior to this mass. In addition, there is a third irregularly marginated hypoechoic mass located within the right breast at the 2 o'clock position 6 cm from the nipple measuring 1.3 x 1.2 x 1.0 cm in size. This is located 1.2 cm superior to the larger mass. When measuring from the outer diameter of these 2 masses, the distance measures 3.9 cm. Tissue sampling via ultrasound-guided core biopsy of the 1.7 cm in size mass and the 1.3 cm mass is recommended. This is scheduled for 12/05/2015. Ultrasound of the right axilla demonstrates  normal axillary contents and no evidence for adenopathy. Also, there is no ultrasound evidence for internal mammary adenopathy. IMPRESSION: 1. 1.7 cm suspicious mass located within the right breast at 2 o'clock position 4 cm from the nipple. Ultrasound-guided core biopsy is recommended. 2. 1.3 cm suspicious mass located within the right breast at 2 o'clock position 6 cm from the nipple. Ultrasound-guided core biopsy of this is also recommended. 3. 7 mm satellite nodule located between these 2 masses (7 mm superior to the larger mass). RECOMMENDATION: Right breast ultrasound-guided core biopsies as discussed above. I have discussed the findings and recommendations with the patient. Results were also provided in writing at the conclusion of the visit. If applicable, a reminder letter will be sent to the patient regarding the next appointment. BI-RADS CATEGORY  5: Highly suggestive of malignancy. Electronically Signed   By: Altamese Cabal M.D.   On: 12/05/2015 12:48   [place] Rt Breast Bx W Loc Dev 1st Lesion Img Bx Spec US Guide  Addendum Date: 12/17/2015   ADDENDUM REPORT: 12/10/2015 07:13 ADDENDUM: Pathology revealed grade II to III invasive mammary carcinoma in the right breast at 2:00, 6 cm from the nipple and grade III invasive ductal carcinoma and ductal carcinoma in situ in the right breast at 2:00, 4 cm from the nipple. This was found to be concordant by Dr. Everlean Alstrom. Pathology results were discussed with the patient by telephone. The patient reported doing well after the biopsies with tenderness and bruising at the sites. Post biopsy instructions and care were reviewed and questions were answered. The patient was encouraged to call [facility] for any additional concerns. Surgical consultation has been arranged with Dr. Rolm Bookbinder at [facility] on December 16, 2015. The patient was encouraged to come to [facility]  for educational materials. Pathology results reported by Susa Raring RN, BSN on 12/10/2015. Electronically Signed   By: Everlean Alstrom M.D.   On: 12/10/2015 07:13   Result Date: 12/17/2015 CLINICAL DATA:  60 year old female with a suspicious mass in the right breast at 2 o'clock 6 cm from the nipple as well as a suspicious mass in the right breast at 2 o'clock 4 cm from the nipple. EXAM: ULTRASOUND GUIDED RIGHT BREAST CORE NEEDLE BIOPSY COMPARISON:  Previous exam(s). FINDINGS: I met with the patient and we discussed the procedure of ultrasound-guided biopsy, including benefits and alternatives. We discussed the high likelihood  of a successful procedure. We discussed the risks of the procedure, including infection, bleeding, tissue injury, clip migration, and inadequate sampling. Informed written consent was given. The usual time-out protocol was performed immediately prior to the procedure. SITE 1: RIGHT BREAST 2 O'CLOCK 6 CM FROM THE NIPPLE: Using sterile technique and 1% Lidocaine as local anesthetic, under direct ultrasound visualization, a 12 gauge spring-loaded device was used to perform biopsy of the mass in the right breast at 2 o'clock 6 cm from the nipple using a lateral to medial approach. At the conclusion of the procedure a ribbon shaped tissue marker clip was deployed into the biopsy cavity. SITE 2: RIGHT BREAST 2 O'CLOCK 4 CM FROM THE NIPPLE: Using sterile technique and 1% Lidocaine as local anesthetic, under direct ultrasound visualization, a 12 gauge spring-loaded device was used to perform biopsy of the mass in the right breast at 2 o'clock 4 cm from nipple using a lateral to medial approach. At the conclusion of the procedure a heart shaped tissue marker clip was deployed into the biopsy cavity. Follow up 2 view mammogram was performed and dictated separately. IMPRESSION: 1. Ultrasound-guided biopsy of the right breast mass at 2 o'clock 6 cm from nipple, at site of ribbon shaped biopsy marking  clip. 2. Ultrasound-guided biopsy of the right breast mass at 2 o'clock 4 cm from nipple, at site of heart shaped biopsy marking clip. Electronically Signed: By: Everlean Alstrom M.D. On: 12/06/2015 08:25   [place] Rt Breast Bx W Loc Dev Ea Add Lesion Img Bx Spec US Guide  Addendum Date: 12/17/2015   ADDENDUM REPORT: 12/10/2015 07:13 ADDENDUM: Pathology revealed grade II to III invasive mammary carcinoma in the right breast at 2:00, 6 cm from the nipple and grade III invasive ductal carcinoma and ductal carcinoma in situ in the right breast at 2:00, 4 cm from the nipple. This was found to be concordant by Dr. Everlean Alstrom. Pathology results were discussed with the patient by telephone. The patient reported doing well after the biopsies with tenderness and bruising at the sites. Post biopsy instructions and care were reviewed and questions were answered. The patient was encouraged to call [facility] for any additional concerns. Surgical consultation has been arranged with Dr. Rolm Bookbinder at [facility] on December 16, 2015. The patient was encouraged to come to [facility] for educational materials. Pathology results reported by Susa Raring RN, BSN on 12/10/2015. Electronically Signed   By: Everlean Alstrom M.D.   On: 12/10/2015 07:13   Result Date: 12/17/2015 CLINICAL DATA:  60 year old female with a suspicious mass in the right breast at 2 o'clock 6 cm from the nipple as well as a suspicious mass in the right breast at 2 o'clock 4 cm from the nipple. EXAM: ULTRASOUND GUIDED RIGHT BREAST CORE NEEDLE BIOPSY COMPARISON:  Previous exam(s). FINDINGS: I met with the patient and we discussed the procedure of ultrasound-guided biopsy, including benefits and alternatives. We discussed the high likelihood of a successful procedure. We discussed the risks of the procedure, including infection, bleeding, tissue injury, clip migration, and  inadequate sampling. Informed written consent was given. The usual time-out protocol was performed immediately prior to the procedure. SITE 1: RIGHT BREAST 2 O'CLOCK 6 CM FROM THE NIPPLE: Using sterile technique and 1% Lidocaine as local anesthetic, under direct ultrasound visualization, a 12 gauge spring-loaded device was used to perform biopsy of the mass in the right breast at 2 o'clock  6 cm from the nipple using a lateral to medial approach. At the conclusion of the procedure a ribbon shaped tissue marker clip was deployed into the biopsy cavity. SITE 2: RIGHT BREAST 2 O'CLOCK 4 CM FROM THE NIPPLE: Using sterile technique and 1% Lidocaine as local anesthetic, under direct ultrasound visualization, a 12 gauge spring-loaded device was used to perform biopsy of the mass in the right breast at 2 o'clock 4 cm from nipple using a lateral to medial approach. At the conclusion of the procedure a heart shaped tissue marker clip was deployed into the biopsy cavity. Follow up 2 view mammogram was performed and dictated separately. IMPRESSION: 1. Ultrasound-guided biopsy of the right breast mass at 2 o'clock 6 cm from nipple, at site of ribbon shaped biopsy marking clip. 2. Ultrasound-guided biopsy of the right breast mass at 2 o'clock 4 cm from nipple, at site of heart shaped biopsy marking clip. Electronically Signed: By: Everlean Alstrom M.D. On: 12/06/2015 08:25      IMPRESSION: Clinical T1c,N0,M0 multifocal invasive ductal carcinoma of the right breast  The patient has 3 discreet lesion in the UIQ of the right breast. Unsure at this point whether she would have a good cosmetic outcome if she proceeded with surgery at this time. I would agree with Dr. Cristal Generous recommendations for neoadjuvant chemotherapy to potentially shrink these lesions and potentially allow breast conservation therapy. The patient seems receptive to the approach if she is able to obtain a good cosmetic outcome. If the patient does undergo  mastectomy I'm unsure whether she will benefit from postmastectomy irradiation until we determine the final surgical margins as well as nodal status.  PLAN: I spoke to the patient today regarding her diagnosis and options for treatment. We discussed the equivalence in terms of survival and local failure between mastectomy and breast conservation. We discussed the role of radiation in decreasing local failures in patients who undergo lumpectomy. We discussed the process of CT simulation and the placement tattoos. We discussed 6 weeks of treatment as an outpatient. We discussed the possibility of asymptomatic lung damage. We discussed the low likelihood of secondary malignancies. We discussed the possible side effects including but not limited to skin redness, fatigue, permanent skin darkening, and breast swelling.  The patient is scheduled to consult with Dr. Lindi Adie tomorrow for input concerning neoadjuvant chemotherapy. The patient will also meet with Dr. Iran Planas next week for plastic surgery input. She met with the genetic counselor yesterday and the patient has decided to proceed with genetic testing and will pay with this out of pocket.     ------------------------------------------------  Blair Promise, PhD, MD  This document serves as a record of services personally performed by Gery Pray, MD. It was created on his behalf by Darcus Austin, a trained medical scribe. The creation of this record is based on the scribe's personal observations and the provider's statements to them. This document has been checked and approved by the attending provider.

## 2015-12-19 NOTE — Telephone Encounter (Signed)
Received appt date/time from Andrea.  Placed a note for an intake form to be given to the pt at time of check in. 

## 2015-12-20 ENCOUNTER — Ambulatory Visit (HOSPITAL_BASED_OUTPATIENT_CLINIC_OR_DEPARTMENT_OTHER): Payer: BLUE CROSS/BLUE SHIELD | Admitting: Hematology and Oncology

## 2015-12-20 ENCOUNTER — Encounter: Payer: Self-pay | Admitting: Hematology and Oncology

## 2015-12-20 ENCOUNTER — Encounter: Payer: Self-pay | Admitting: *Deleted

## 2015-12-20 ENCOUNTER — Other Ambulatory Visit: Payer: BLUE CROSS/BLUE SHIELD

## 2015-12-20 VITALS — BP 127/80 | HR 93 | Temp 98.7°F | Resp 18 | Ht 63.0 in | Wt 162.2 lb

## 2015-12-20 DIAGNOSIS — C50211 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 female breast: Secondary | ICD-10-CM

## 2015-12-20 DIAGNOSIS — Z170 Estrogen receptor positive status [ER+]: Secondary | ICD-10-CM | POA: Diagnosis not present

## 2015-12-20 MED ORDER — ONDANSETRON HCL 8 MG PO TABS: 8.0000 mg | ORAL_TABLET | Freq: Two times a day (BID) | ORAL | 1 refills | Status: DC | PRN

## 2015-12-20 MED ORDER — LIDOCAINE-PRILOCAINE 2.5-2.5 % EX CREA: TOPICAL_CREAM | CUTANEOUS | 3 refills | Status: DC

## 2015-12-20 MED ORDER — LORAZEPAM 0.5 MG PO TABS: 0.5000 mg | ORAL_TABLET | Freq: Four times a day (QID) | ORAL | 0 refills | Status: DC | PRN

## 2015-12-20 MED ORDER — DEXAMETHASONE 4 MG PO TABS: 4.0000 mg | ORAL_TABLET | Freq: Every day | ORAL | 0 refills | Status: DC

## 2015-12-20 MED ORDER — PROCHLORPERAZINE MALEATE 10 MG PO TABS: 10.0000 mg | ORAL_TABLET | Freq: Four times a day (QID) | ORAL | 1 refills | Status: DC | PRN

## 2015-12-20 NOTE — Progress Notes (Signed)
Patricia Chandler CONSULT NOTE  Patient Care Team: Kelton Pillar, MD as PCP - General (Family Medicine)  CHIEF COMPLAINTS/PURPOSE OF CONSULTATION:  Newly diagnosed breast cancer  HISTORY OF PRESENTING ILLNESS:  Patricia Chandler 60 y.o. female is here because of recent diagnosis of right breast cancer. She had a routine screening mammogram that detected abnormality in the right breast. There were 2 lesions identified. Both of these were biopsied. The tumor that is 4 cm from the nipple at 2:00 position was a grade 2-3 invasive ductal carcinoma that was ER 90% PR 2% positive and HER-2 is positive. The second tumor which measured 1.3 cm which is at 6 and enters from the nipple is ER/PR positive HER-2 negative with a Ki-67 of 80%. She was presented at the multidisciplinary tumor board and she is here today to discuss treatment plan. She underwent a breast MRI recently. She saw Dr. [name] with general surgery. She also underwent genetic testing.  I reviewed her records extensively and collaborated the history with the patient.  SUMMARY OF ONCOLOGIC HISTORY:   Breast cancer of upper-inner quadrant of right female breast ([place])   12/05/2015 Mammogram    Right breast mass 2:00 4 cm from nipple: 1.7 cm, T1c N0 stage IA; right breast 2:00 6 cm from nipple 1.3 cm mass, 7 mm satellite nodule between the 2 masses, T1 cN0 stage IA      12/06/2015 Initial Diagnosis    Right breast biopsy 2:00 6 cm from nipple: Grade 2-3 invasive ductal cancer, ER 90%, PR 2%, Ki-67 80%, HER-2 positive ratio 2.07; right biopsy 2:00 4 cm from nipple: IDC with DCIS, ER 95%, PR 10%, Ki-67 90%, HER-2 negative ratio 1.27      12/18/2015 Breast MRI    3 enhancing masses in the upper inner quadrant right breast spanning an area 3.7 cm (1.7 cm, 1 cm, 1.4 cm) no lymph node enlargement      MEDICAL HISTORY:  Past Medical History:  Diagnosis Date  . Breast cancer ([place])   . Diverticulosis   . Family history of breast  cancer   . Genital herpes   . Internal hemorrhoid     SURGICAL HISTORY: Past Surgical History:  Procedure Laterality Date  . BREAST ENHANCEMENT SURGERY    . BREAST LUMPECTOMY     right/BENIGN    SOCIAL HISTORY: Social History   Social History  . Marital status: Married    Spouse name: N/A  . Number of children: 2  . Years of education: N/A   Occupational History  . medical receptionist    Social History Main Topics  . Smoking status: Never Smoker  . Smokeless tobacco: Never Used  . Alcohol use 1.8 oz/week    3 Glasses of wine per week     Comment: 2 drinks per day  . Drug use: No  . Sexual activity: Not on file   Other Topics Concern  . Not on file   Social History Narrative  . No narrative on file    FAMILY HISTORY: Family History  Problem Relation Age of Onset  . Breast cancer Maternal Aunt     dx in her 32s  . Stroke Father   . Hypertension Father   . Pulmonary fibrosis Mother   . Breast cancer Paternal Aunt 78  . Cancer Paternal Grandmother     abdominal; cervical vs uterine dx in her 1s  . Parkinson's disease Maternal Grandfather     ALLERGIES:  has No Known Allergies.  MEDICATIONS:  Current Outpatient Prescriptions  Medication Sig Dispense Refill  . bisacodyl (DULCOLAX) 5 MG EC tablet Take 5 mg by mouth. Dulcolax 5 mg bowel prep #4-Take as directed    . calcium carbonate (OS-CAL) 600 MG TABS tablet Take 600 mg by mouth daily.    . cholecalciferol (VITAMIN D) 1000 UNITS tablet Take 1,000 Units by mouth daily.    . magnesium 30 MG tablet Take 30 mg by mouth 2 (two) times daily.    . polyethylene glycol powder (GLYCOLAX/MIRALAX) powder Take 1 Container by mouth once. Miralax bowel prep 238 gm-Take as directed    . Turmeric POWD by Does not apply route.     No current facility-administered medications for this visit.     REVIEW OF SYSTEMS:   Constitutional: Denies fevers, chills or abnormal night sweats Eyes: Denies blurriness of vision,  double vision or watery eyes Ears, nose, mouth, throat, and face: Denies mucositis or sore throat Respiratory: Denies cough, dyspnea or wheezes Cardiovascular: Denies palpitation, chest discomfort or lower extremity swelling Gastrointestinal:  Denies nausea, heartburn or change in bowel habits Skin: Denies abnormal skin rashes Lymphatics: Denies new lymphadenopathy or easy bruising Neurological:Denies numbness, tingling or new weaknesses Behavioral/Psych: Mood is stable, no new changes  Breast:  Denies any palpable lumps or discharge All other systems were reviewed with the patient and are negative.  PHYSICAL EXAMINATION: ECOG PERFORMANCE STATUS: 0 - Asymptomatic  Vitals:   12/20/15 0848  BP: 127/80  Pulse: 93  Resp: 18  Temp: 98.7 F (37.1 C)   Filed Weights   12/20/15 0848  Weight: 162 lb 3.2 oz (73.6 kg)    GENERAL:alert, no distress and comfortable SKIN: skin color, texture, turgor are normal, no rashes or significant lesions EYES: normal, conjunctiva are pink and non-injected, sclera clear OROPHARYNX:no exudate, no erythema and lips, buccal mucosa, and tongue normal  NECK: supple, thyroid normal size, non-tender, without nodularity LYMPH:  no palpable lymphadenopathy in the cervical, axillary or inguinal LUNGS: clear to auscultation and percussion with normal breathing effort HEART: regular rate & rhythm and no murmurs and no lower extremity edema ABDOMEN:abdomen soft, non-tender and normal bowel sounds Musculoskeletal:no cyanosis of digits and no clubbing  PSYCH: alert & oriented x 3 with fluent speech NEURO: no focal motor/sensory deficits BREAST: No palpable nodules in breast. No palpable axillary or supraclavicular lymphadenopathy (exam performed in the presence of a chaperone)   LABORATORY DATA:  I have reviewed the data as listed Lab Results  Component Value Date   HGB 16.7 (H) 02/04/2012   HCT 49.0 (H) 02/04/2012   Lab Results  Component Value Date   NA  138 02/04/2012   K 4.0 02/04/2012   CL 104 02/04/2012    RADIOGRAPHIC STUDIES: I have personally reviewed the radiological reports and agreed with the findings in the report.  ASSESSMENT AND PLAN:  Breast cancer of upper-inner quadrant of right female breast ([place]) Right breast biopsy 2:00 6 cm from nipple: Grade 2-3 invasive ductal cancer, ER 90%, PR 2%, Ki-67 80%, HER-2 positive ratio 2.07; right biopsy 2:00 4 cm from nipple: IDC with DCIS, ER 95%, PR 10%, Ki-67 90%, HER-2 negative ratio 1.27 Breast MRI 12/18/2015: 3 enhancing masses in the upper inner quadrant right breast spanning an area 3.7 cm (1.7 cm, 1 cm, 1.4 cm) no lymph node enlargement Clinical stage: T2N0 (stage 2A)  Pathology and radiology review: Discussed with the patient, the details of pathology including the type of breast cancer,the clinical staging, the significance  of ER, PR and HER-2/neu receptors and the implications for treatment. After reviewing the pathology in detail, we proceeded to discuss the different treatment options between surgery, radiation, chemotherapy, antiestrogen therapies.  Recommendation: 1. Genetic counseling 2. Neoadjuvant chemotherapy. ([place] Perjeta 6 cycles followed by Herceptin maintenance for one year) 3. Breast conserving surgery followed by 4. Adjuvant radiation therapy followed by 5. Adjuvant antiestrogen therapy  Chemotherapy Counseling: I discussed the risks and benefits of chemotherapy including the risks of nausea/ vomiting, risk of infection from low WBC count, fatigue due to chemo or anemia, bruising or bleeding due to low platelets, mouth sores, loss/ change in taste and decreased appetite. Liver and kidney function will be monitored through out chemotherapy as abnormalities in liver and kidney function may be a side effect of treatment. Cardiac dysfunction due to Herceptin and Perjeta were discussed in detail. Risk of permanent bone marrow dysfunction and leukemia due to chemo were  also discussed.  Plan: 1. Port placement during surgery 2. echocardiogram and cardiology consult 3. Chemotherapy class   Start chemotherapy on 12/31/2015. All questions were answered. The patient knows to call the clinic with any problems, questions or concerns.    Rulon Eisenmenger, MD 12/20/15

## 2015-12-20 NOTE — Addendum Note (Signed)
Encounter addended by: Jacqulyn Liner, RN on: 12/20/2015  8:33 AM<BR>    Actions taken: Visit diagnoses modified, Charge Capture section accepted

## 2015-12-20 NOTE — Assessment & Plan Note (Signed)
Right breast biopsy 2:00 6 cm from nipple: Grade 2-3 invasive ductal cancer, ER 90%, PR 2%, Ki-67 80%, HER-2 positive ratio 2.07; right biopsy 2:00 4 cm from nipple: IDC with DCIS, ER 95%, PR 10%, Ki-67 90%, HER-2 negative ratio 1.27 Breast MRI 12/18/2015: 3 enhancing masses in the upper inner quadrant right breast spanning an area 3.7 cm (1.7 cm, 1 cm, 1.4 cm) no lymph node enlargement Clinical stage: T1cN0 stage IA vs T2N0 (stage 2A)  Pathology and radiology review: Discussed with the patient, the details of pathology including the type of breast cancer,the clinical staging, the significance of ER, PR and HER-2/neu receptors and the implications for treatment. After reviewing the pathology in detail, we proceeded to discuss the different treatment options between surgery, radiation, chemotherapy, antiestrogen therapies.  Recommendation: 1. Genetic counseling 2. patient will need either neoadjuvant or adjuvant chemotherapy. ([place] Perjeta 6 cycles followed by Herceptin maintenance for one year) 3. Breast conserving surgery followed by 4. Adjuvant radiation therapy followed by 5. Adjuvant antiestrogen therapy  Chemotherapy Counseling: I discussed the risks and benefits of chemotherapy including the risks of nausea/ vomiting, risk of infection from low WBC count, fatigue due to chemo or anemia, bruising or bleeding due to low platelets, mouth sores, loss/ change in taste and decreased appetite. Liver and kidney function will be monitored through out chemotherapy as abnormalities in liver and kidney function may be a side effect of treatment. Cardiac dysfunction due to Herceptin and Perjeta were discussed in detail. Risk of permanent bone marrow dysfunction and leukemia due to chemo were also discussed.  Plan: 1. Start antiestrogen therapy with anastrozole 1 mg by mouth daily since it is going to take a few weeks for her to get the surgery 2. port placement during surgery 3. Adjuvant chemotherapy  following surgery  Return to clinic one week after surgery to finalize adjuvant treatment plan.

## 2015-12-24 ENCOUNTER — Encounter (HOSPITAL_BASED_OUTPATIENT_CLINIC_OR_DEPARTMENT_OTHER): Payer: Self-pay | Admitting: *Deleted

## 2015-12-24 ENCOUNTER — Other Ambulatory Visit: Payer: Self-pay | Admitting: *Deleted

## 2015-12-24 ENCOUNTER — Other Ambulatory Visit: Payer: Self-pay | Admitting: Hematology and Oncology

## 2015-12-24 DIAGNOSIS — C50211 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 female breast: Secondary | ICD-10-CM

## 2015-12-25 ENCOUNTER — Telehealth (HOSPITAL_COMMUNITY): Payer: Self-pay | Admitting: Vascular Surgery

## 2015-12-25 ENCOUNTER — Telehealth: Payer: Self-pay | Admitting: *Deleted

## 2015-12-25 NOTE — Telephone Encounter (Signed)
Spoke with patient and confirmed echo appt. For 9am on 9/7 at [facility] and then she will have chemo class at 10am.  Encouraged her to call with any questions or concerns.

## 2015-12-25 NOTE — Telephone Encounter (Signed)
Left pt message to make NP brst appt w/ [facility] 12/27/15 @ 10:30

## 2015-12-26 ENCOUNTER — Encounter: Payer: Self-pay | Admitting: *Deleted

## 2015-12-26 ENCOUNTER — Ambulatory Visit (HOSPITAL_BASED_OUTPATIENT_CLINIC_OR_DEPARTMENT_OTHER)
Admission: RE | Admit: 2015-12-26 | Discharge: 2015-12-26 | Disposition: A | Discharge status: Home / Self Care | Payer: BLUE CROSS/BLUE SHIELD | Source: Ambulatory Visit | Attending: Internal Medicine | Admitting: Internal Medicine

## 2015-12-26 ENCOUNTER — Other Ambulatory Visit: Payer: BLUE CROSS/BLUE SHIELD

## 2015-12-26 ENCOUNTER — Ambulatory Visit (HOSPITAL_COMMUNITY)
Admission: RE | Admit: 2015-12-26 | Discharge: 2015-12-26 | Disposition: A | Discharge status: Home / Self Care | Payer: BLUE CROSS/BLUE SHIELD | Source: Ambulatory Visit | Attending: Hematology and Oncology | Admitting: Hematology and Oncology

## 2015-12-26 VITALS — BP 114/74 | HR 73 | Wt 161.2 lb

## 2015-12-26 DIAGNOSIS — C50211 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 female breast: Secondary | ICD-10-CM

## 2015-12-26 DIAGNOSIS — Z09 Encounter for follow-up examination after completed treatment for conditions other than malignant neoplasm: Secondary | ICD-10-CM | POA: Diagnosis present

## 2015-12-26 NOTE — Progress Notes (Signed)
CARDIO-ONCOLOGY CONSULT NOTE  Patient Care Team: Kelton Pillar, MD as PCP - General (Family Medicine)  Referring oncologist: Lindi Adie   HISTORY OF PRESENTING ILLNESS:  Patricia Chandler is a 60 y/o woman with no cardiac history undergoing treatment for right breast cancer. Referred by Dr. Lindi Adie for enrollment into the [facility].   SUMMARY OF ONCOLOGIC HISTORY:   Breast cancer of upper-inner quadrant of right female breast ([place])   12/05/2015 Mammogram    Right breast mass 2:00 4 cm from nipple: 1.7 cm, T1c N0 stage IA; right breast 2:00 6 cm from nipple 1.3 cm mass, 7 mm satellite nodule between the 2 masses, T1 cN0 stage IA      12/06/2015 Initial Diagnosis    Right breast biopsy 2:00 6 cm from nipple: Grade 2-3 invasive ductal cancer, ER 90%, PR 2%, Ki-67 80%, HER-2 positive ratio 2.07; right biopsy 2:00 4 cm from nipple: IDC with DCIS, ER 95%, PR 10%, Ki-67 90%, HER-2 negative ratio 1.27      12/18/2015 Breast MRI    3 enhancing masses in the upper inner quadrant right breast spanning an area 3.7 cm (1.7 cm, 1 cm, 1.4 cm) no lymph node enlargement      HPI:   Feels well. Under a lot of stress with recent diagnosis. Denies any cardiac symptoms. Active at baseline.   Echo (12/26/15) EF 60-65% GLS -22.5% Lateral s' 11.4 cm/s. (reviewed personally)  MEDICAL HISTORY:  Past Medical History:  Diagnosis Date  . Breast cancer ([place])   . Diverticulosis   . Family history of breast cancer   . Genital herpes   . Heart murmur    had ECHO 12 yrs ago was neg  . Internal hemorrhoid     SURGICAL HISTORY: Past Surgical History:  Procedure Laterality Date  . BREAST ENHANCEMENT SURGERY    . BREAST LUMPECTOMY     right/BENIGN    SOCIAL HISTORY: Social History   Social History  . Marital status: Married    Spouse name: N/A  . Number of children: 2  . Years of education: N/A   Occupational History  . medical receptionist    Social History Main Topics  .  Smoking status: Never Smoker  . Smokeless tobacco: Never Used  . Alcohol use 1.8 oz/week    3 Glasses of wine per week     Comment: 2 drinks per day  . Drug use: No  . Sexual activity: Not on file   Other Topics Concern  . Not on file   Social History Narrative  . No narrative on file    FAMILY HISTORY: Family History  Problem Relation Age of Onset  . Breast cancer Maternal Aunt     dx in her 57s  . Stroke Father   . Hypertension Father   . Pulmonary fibrosis Mother   . Breast cancer Paternal Aunt 78  . Cancer Paternal Grandmother     abdominal; cervical vs uterine dx in her 55s  . Parkinson's disease Maternal Grandfather     ALLERGIES:  has No Known Allergies.  MEDICATIONS:  Current Outpatient Prescriptions  Medication Sig Dispense Refill  . calcium carbonate (OS-CAL) 600 MG TABS tablet Take 600 mg by mouth daily.    . cholecalciferol (VITAMIN D) 1000 UNITS tablet Take 1,000 Units by mouth daily.    Marland Kitchen dexamethasone (DECADRON) 4 MG tablet Take 1 tablet (4 mg total) by mouth daily. 1 tablet daily before chemotherapy and 1 tablet day after chemotherapy 15  tablet 0  . lidocaine-prilocaine (EMLA) cream Apply to affected area once 30 g 3  . LORazepam (ATIVAN) 0.5 MG tablet Take 1 tablet (0.5 mg total) by mouth every 6 (six) hours as needed (Nausea or vomiting). 30 tablet 0  . magnesium 30 MG tablet Take 30 mg by mouth 2 (two) times daily.    . ondansetron (ZOFRAN) 8 MG tablet Take 1 tablet (8 mg total) by mouth 2 (two) times daily as needed for refractory nausea / vomiting. Start on day 3 after chemo. 30 tablet 1  . prochlorperazine (COMPAZINE) 10 MG tablet Take 1 tablet (10 mg total) by mouth every 6 (six) hours as needed (Nausea or vomiting). 30 tablet 1   No current facility-administered medications for this encounter.     REVIEW OF SYSTEMS:   Constitutional: Denies fevers, chills or abnormal night sweats Eyes: Denies blurriness of vision, double vision or watery  eyes Ears, nose, mouth, throat, and face: Denies mucositis or sore throat Respiratory: Denies cough, dyspnea or wheezes Cardiovascular: Denies palpitation, chest discomfort or lower extremity swelling Gastrointestinal:  Denies nausea, heartburn or change in bowel habits Skin: Denies abnormal skin rashes Lymphatics: Denies new lymphadenopathy or easy bruising Neurological:Denies numbness, tingling or new weaknesses Behavioral/Psych: Mood is stable, no new changes  Breast:  Denies any palpable lumps or discharge All other systems were reviewed with the patient and are negative.    Vitals:   12/26/15 1238  BP: 114/74  Pulse: 73   Filed Weights   12/26/15 1238  Weight: 161 lb 3.2 oz (73.1 kg)    General:  Well appearing. No resp difficulty HEENT: normal Neck: supple. no JVD. Carotids 2+ bilat; no bruits. No lymphadenopathy or thryomegaly appreciated. Cor: PMI nondisplaced. Regular rate & rhythm. No rubs, gallops or murmurs. Lungs: clear Abdomen: soft, nontender, nondistended. No hepatosplenomegaly. No bruits or masses. Good bowel sounds. Extremities: no cyanosis, clubbing, rash, edema Neuro: alert & orientedx3, cranial nerves grossly intact. moves all 4 extremities w/o difficulty. Affect pleasant   LABORATORY DATA:  I have reviewed the data as listed Lab Results  Component Value Date   HGB 16.7 (H) 02/04/2012   HCT 49.0 (H) 02/04/2012   Lab Results  Component Value Date   NA 138 02/04/2012   K 4.0 02/04/2012   CL 104 02/04/2012     ASSESSMENT AND PLAN:   1. Breast cancer of upper-inner quadrant of right female breast ([place]) -- ER 90%, PR 2%, Ki-67 80%, HER-2 positive ratio 2.07;  -- Clinical stage: T2N0 (stage 2A) -- Plan for neoadjuvant chemotherapy. ([place] Perjeta 6 cycles followed by Herceptin maintenance for one year) -- Followed by breast conserving surgery followed by adjuvant XRT followed by adjuvant antiestrogen therapy -- Explained incidence of Herceptin  cardiotoxicity and role of Cardio-oncology clinic at length. Echo images reviewed personally. All parameters stable. Reviewed signs and symptoms of HF to look for. OK to start Herceptin. Follow-up with echo in 3 months.  Tolbert Matheson,MD 12:35 AM      Glori Bickers, MD 12/20/15

## 2015-12-26 NOTE — Patient Instructions (Signed)
We will contact you in 3 months to schedule your next appointment and echocardiogram  

## 2015-12-26 NOTE — Progress Notes (Signed)
  Echocardiogram 2D Echocardiogram has been performed.  Darlina Sicilian M 12/26/2015, 9:55 AM

## 2015-12-27 ENCOUNTER — Telehealth: Payer: Self-pay | Admitting: Hematology and Oncology

## 2015-12-27 ENCOUNTER — Encounter: Payer: Self-pay | Admitting: *Deleted

## 2015-12-27 ENCOUNTER — Ambulatory Visit (HOSPITAL_COMMUNITY): Payer: BLUE CROSS/BLUE SHIELD

## 2015-12-27 ENCOUNTER — Encounter: Payer: Self-pay | Admitting: Hematology and Oncology

## 2015-12-27 NOTE — Progress Notes (Signed)
Called pt on 12/26/15 to introduce myself as her FA and to talk about copay assistance.  We discussed the [employer], PAF and the [place] First Step program.  Pt gave me consent to apply in her behalf. She is approved for $25,000 for Herceptin &$25,000 for Perjeta from 12/27/15 to 12/25/16.  I also applied to the [employer] [place] which pt is approved for $5000 to cover drugs associated w/ her Dx for 12 months from 12/27/15 w/ a 6 month look back period. [id number] is guaranteed, $3350 is accessible on a first-come first-served basis as long as funding is available. I emailed copies of the approval letters &POE to [place] in billing & gave a copy to HIM to scan in pt's chart.  Informed pt of the [place] &went over what it will cover. She will bring her proof of income on her next visit.  Pt will also be enrolled in the [place] First Step program on her next visit.

## 2015-12-27 NOTE — Progress Notes (Signed)
[place] Psychosocial Distress Screening Clinical Social Work  Clinical Social Work was referred by distress screening protocol.  The patient scored a 5 on the Psychosocial Distress Thermometer which indicates moderate distress. Clinical Social Worker phoned pt to assess for distress and other psychosocial needs. CSW spoke to pt to introduce self, explain role of CSW/Support Team and assess needs. Pt shared she would really like to connect with Lorrin Jackson for additional counseling. She reports she met with her at chemo class and feels very comfortable talking with her. Pt was in a store shopping when CSW phoned and could not talk as confidential as she would like. Pt reports she has several questions re her chemo plan as well. She reports Dr Lindi Adie was going to make some changes and she was wondering if that had occurred. CSW explained CSW would not be aware of that, but would see if her RN Navigator could attempt to answer her questions. Pt agreed to referrals to chaplain and RN navigator. Pt appreciated call and referrals. She agrees to [place] team checking in on her as well.   ONCBCN DISTRESS SCREENING 12/19/2015  Screening Type Initial Screening  Distress experienced in past week (1-10) 5  Practical problem type Food  Emotional problem type Nervousness/Anxiety;Adjusting to illness  Information Concerns Type Lack of info about treatment    Clinical Social Worker follow up needed: Yes.    If yes, follow up plan:  See above Loren Racer, [place] Worker [place]  [facility] Phone: [phone] Fax: [phone]

## 2015-12-27 NOTE — Telephone Encounter (Signed)
FAXED PT RECORDS TO [facility] [id number]

## 2015-12-30 ENCOUNTER — Encounter (HOSPITAL_BASED_OUTPATIENT_CLINIC_OR_DEPARTMENT_OTHER)
Admission: RE | Disposition: A | Discharge status: Home / Self Care | Payer: Self-pay | Source: Ambulatory Visit | Attending: General Surgery

## 2015-12-30 ENCOUNTER — Ambulatory Visit (HOSPITAL_BASED_OUTPATIENT_CLINIC_OR_DEPARTMENT_OTHER): Payer: BLUE CROSS/BLUE SHIELD | Admitting: Anesthesiology

## 2015-12-30 ENCOUNTER — Ambulatory Visit (HOSPITAL_COMMUNITY): Payer: BLUE CROSS/BLUE SHIELD

## 2015-12-30 ENCOUNTER — Encounter (HOSPITAL_BASED_OUTPATIENT_CLINIC_OR_DEPARTMENT_OTHER): Payer: Self-pay | Admitting: Anesthesiology

## 2015-12-30 ENCOUNTER — Ambulatory Visit (HOSPITAL_BASED_OUTPATIENT_CLINIC_OR_DEPARTMENT_OTHER)
Admission: RE | Admit: 2015-12-30 | Discharge: 2015-12-30 | Disposition: A | Discharge status: Home / Self Care | Payer: BLUE CROSS/BLUE SHIELD | Source: Ambulatory Visit | Attending: General Surgery | Admitting: General Surgery

## 2015-12-30 ENCOUNTER — Other Ambulatory Visit: Payer: Self-pay | Admitting: General Surgery

## 2015-12-30 ENCOUNTER — Encounter: Payer: Self-pay | Admitting: General Practice

## 2015-12-30 DIAGNOSIS — Z803 Family history of malignant neoplasm of breast: Secondary | ICD-10-CM | POA: Diagnosis not present

## 2015-12-30 DIAGNOSIS — C50211 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 female breast: Secondary | ICD-10-CM | POA: Insufficient documentation

## 2015-12-30 DIAGNOSIS — Z452 Encounter for adjustment and management of vascular access device: Secondary | ICD-10-CM | POA: Diagnosis not present

## 2015-12-30 DIAGNOSIS — K219 Gastro-esophageal reflux disease without esophagitis: Secondary | ICD-10-CM | POA: Diagnosis not present

## 2015-12-30 DIAGNOSIS — Z95828 Presence of other vascular implants and grafts: Secondary | ICD-10-CM

## 2015-12-30 DIAGNOSIS — Z419 Encounter for procedure for purposes other than remedying health state, unspecified: Secondary | ICD-10-CM

## 2015-12-30 HISTORY — DX: Cardiac murmur, unspecified: R01.1

## 2015-12-30 HISTORY — PX: PORTACATH PLACEMENT: SHX2246

## 2015-12-30 SURGERY — INSERTION, TUNNELED CENTRAL VENOUS DEVICE, WITH PORT
Anesthesia: General | Site: Chest

## 2015-12-30 MED ORDER — LIDOCAINE 2% (20 MG/ML) 5 ML SYRINGE
INTRAMUSCULAR | Status: DC | PRN
  Administered 2015-12-30: 60 mg via INTRAVENOUS

## 2015-12-30 MED ORDER — PROPOFOL 10 MG/ML IV BOLUS
INTRAVENOUS | Status: DC | PRN
  Administered 2015-12-30: 150 mg via INTRAVENOUS
  Administered 2015-12-30: 50 mg via INTRAVENOUS

## 2015-12-30 MED ORDER — EPHEDRINE 5 MG/ML INJ
INTRAVENOUS | Status: AC
  Filled 2015-12-30: qty 10

## 2015-12-30 MED ORDER — FENTANYL CITRATE (PF) 100 MCG/2ML IJ SOLN
INTRAMUSCULAR | Status: AC
  Filled 2015-12-30: qty 2

## 2015-12-30 MED ORDER — ONDANSETRON HCL 4 MG/2ML IJ SOLN
INTRAMUSCULAR | Status: AC
  Filled 2015-12-30: qty 2

## 2015-12-30 MED ORDER — DEXAMETHASONE SODIUM PHOSPHATE 4 MG/ML IJ SOLN
INTRAMUSCULAR | Status: DC | PRN
  Administered 2015-12-30: 10 mg via INTRAVENOUS

## 2015-12-30 MED ORDER — ONDANSETRON HCL 4 MG/2ML IJ SOLN
INTRAMUSCULAR | Status: DC | PRN
Start: 2015-12-30 — End: 2015-12-30

## 2015-12-30 MED ORDER — BUPIVACAINE HCL (PF) 0.25 % IJ SOLN
INTRAMUSCULAR | Status: DC | PRN
  Administered 2015-12-30: 6 mL

## 2015-12-30 MED ORDER — CEFAZOLIN SODIUM-DEXTROSE 2-4 GM/100ML-% IV SOLN
INTRAVENOUS | Status: AC
  Filled 2015-12-30: qty 100

## 2015-12-30 MED ORDER — GLYCOPYRROLATE 0.2 MG/ML IJ SOLN: 0.2000 mg | Freq: Once | INTRAMUSCULAR | Status: DC | PRN

## 2015-12-30 MED ORDER — ONDANSETRON HCL 4 MG/2ML IJ SOLN: 4.0000 mg | Freq: Once | INTRAMUSCULAR | Status: DC | PRN

## 2015-12-30 MED ORDER — FENTANYL CITRATE (PF) 100 MCG/2ML IJ SOLN
25.0000 ug | INTRAMUSCULAR | Status: DC | PRN
  Administered 2015-12-30: 50 ug via INTRAVENOUS
  Administered 2015-12-30: 25 ug via INTRAVENOUS

## 2015-12-30 MED ORDER — HYDROCODONE-ACETAMINOPHEN 10-325 MG PO TABS: 1.0000 | ORAL_TABLET | Freq: Four times a day (QID) | ORAL | 0 refills | Status: DC | PRN

## 2015-12-30 MED ORDER — SUCCINYLCHOLINE CHLORIDE 20 MG/ML IJ SOLN
INTRAMUSCULAR | Status: DC | PRN
  Administered 2015-12-30: 80 mg via INTRAVENOUS

## 2015-12-30 MED ORDER — LACTATED RINGERS IV SOLN
INTRAVENOUS | Status: DC
  Administered 2015-12-30: 14:00:00 via INTRAVENOUS

## 2015-12-30 MED ORDER — MIDAZOLAM HCL 2 MG/2ML IJ SOLN
INTRAMUSCULAR | Status: AC
  Filled 2015-12-30: qty 2

## 2015-12-30 MED ORDER — CEFAZOLIN SODIUM-DEXTROSE 2-4 GM/100ML-% IV SOLN
2.0000 g | INTRAVENOUS | Status: AC
  Administered 2015-12-30: 2 g via INTRAVENOUS

## 2015-12-30 MED ORDER — FENTANYL CITRATE (PF) 100 MCG/2ML IJ SOLN
50.0000 ug | INTRAMUSCULAR | Status: DC | PRN
  Administered 2015-12-30: 100 ug via INTRAVENOUS

## 2015-12-30 MED ORDER — ONDANSETRON HCL 4 MG/2ML IJ SOLN
INTRAMUSCULAR | Status: DC | PRN
  Administered 2015-12-30: 4 mg via INTRAVENOUS

## 2015-12-30 MED ORDER — DEXAMETHASONE SODIUM PHOSPHATE 10 MG/ML IJ SOLN
INTRAMUSCULAR | Status: AC
  Filled 2015-12-30: qty 1

## 2015-12-30 MED ORDER — MIDAZOLAM HCL 2 MG/2ML IJ SOLN
1.0000 mg | INTRAMUSCULAR | Status: DC | PRN
  Administered 2015-12-30: 2 mg via INTRAVENOUS

## 2015-12-30 MED ORDER — PROPOFOL 10 MG/ML IV BOLUS
INTRAVENOUS | Status: AC
  Filled 2015-12-30: qty 20

## 2015-12-30 MED ORDER — SCOPOLAMINE 1 MG/3DAYS TD PT72: 1.0000 | MEDICATED_PATCH | Freq: Once | TRANSDERMAL | Status: DC | PRN

## 2015-12-30 MED ORDER — SUCCINYLCHOLINE CHLORIDE 200 MG/10ML IV SOSY
PREFILLED_SYRINGE | INTRAVENOUS | Status: DC | PRN
  Administered 2015-12-30: 80 mg via INTRAVENOUS

## 2015-12-30 SURGICAL SUPPLY — 52 items
APL SKNCLS STERI-STRIP NONHPOA (GAUZE/BANDAGES/DRESSINGS) ×1
BAG DECANTER FOR FLEXI CONT (MISCELLANEOUS) ×2 IMPLANT
BENZOIN TINCTURE PRP APPL 2/3 (GAUZE/BANDAGES/DRESSINGS) ×2 IMPLANT
BLADE SURG 11 STRL SS (BLADE) ×2 IMPLANT
BLADE SURG 15 STRL LF DISP TIS (BLADE) ×1 IMPLANT
BLADE SURG 15 STRL SS (BLADE) ×2
CANISTER SUCT 1200ML W/VALVE (MISCELLANEOUS) IMPLANT
CHLORAPREP W/TINT 26ML (MISCELLANEOUS) ×2 IMPLANT
COVER BACK TABLE 60X90IN (DRAPES) ×2 IMPLANT
COVER MAYO STAND STRL (DRAPES) ×2 IMPLANT
COVER PROBE 5X48 (MISCELLANEOUS) ×2
DECANTER SPIKE VIAL GLASS SM (MISCELLANEOUS) IMPLANT
DRAPE C-ARM 42X72 X-RAY (DRAPES) ×2 IMPLANT
DRAPE LAPAROSCOPIC ABDOMINAL (DRAPES) ×2 IMPLANT
DRAPE UTILITY XL STRL (DRAPES) ×2 IMPLANT
DRSG TEGADERM 4X4.75 (GAUZE/BANDAGES/DRESSINGS) ×2 IMPLANT
ELECT COATED BLADE 2.86 ST (ELECTRODE) ×2 IMPLANT
ELECT REM PT RETURN 9FT ADLT (ELECTROSURGICAL) ×2
ELECTRODE REM PT RTRN 9FT ADLT (ELECTROSURGICAL) ×1 IMPLANT
GLOVE BIO SURGEON STRL SZ7 (GLOVE) ×2 IMPLANT
GLOVE BIOGEL PI IND STRL 7.0 (GLOVE) IMPLANT
GLOVE BIOGEL PI IND STRL 7.5 (GLOVE) ×1 IMPLANT
GLOVE BIOGEL PI INDICATOR 7.0 (GLOVE) ×1
GLOVE BIOGEL PI INDICATOR 7.5 (GLOVE) ×1
GLOVE ECLIPSE 6.5 STRL STRAW (GLOVE) ×2 IMPLANT
GOWN STRL REUS W/ TWL LRG LVL3 (GOWN DISPOSABLE) ×2 IMPLANT
GOWN STRL REUS W/TWL LRG LVL3 (GOWN DISPOSABLE) ×4
IV KIT MINILOC 20X1 SAFETY (NEEDLE) IMPLANT
KIT CVR 48X5XPRB PLUP LF (MISCELLANEOUS) IMPLANT
KIT PORT POWER 8FR ISP CVUE (Catheter) ×1 IMPLANT
LIQUID BAND (GAUZE/BANDAGES/DRESSINGS) ×2 IMPLANT
NDL HYPO 25X1 1.5 SAFETY (NEEDLE) ×1 IMPLANT
NDL SAFETY ECLIPSE 18X1.5 (NEEDLE) IMPLANT
NEEDLE HYPO 18GX1.5 SHARP (NEEDLE)
NEEDLE HYPO 25X1 1.5 SAFETY (NEEDLE) ×2 IMPLANT
PACK BASIN DAY SURGERY FS (CUSTOM PROCEDURE TRAY) ×2 IMPLANT
PENCIL BUTTON HOLSTER BLD 10FT (ELECTRODE) ×2 IMPLANT
SLEEVE SCD COMPRESS KNEE MED (MISCELLANEOUS) ×2 IMPLANT
SPONGE GAUZE 4X4 12PLY STER LF (GAUZE/BANDAGES/DRESSINGS) ×2 IMPLANT
STRIP CLOSURE SKIN 1/2X4 (GAUZE/BANDAGES/DRESSINGS) ×2 IMPLANT
SUT MON AB 4-0 PC3 18 (SUTURE) ×2 IMPLANT
SUT PROLENE 2 0 SH DA (SUTURE) ×2 IMPLANT
SUT SILK 2 0 TIES 17X18 (SUTURE)
SUT SILK 2-0 18XBRD TIE BLK (SUTURE) IMPLANT
SUT VIC AB 3-0 SH 27 (SUTURE) ×2
SUT VIC AB 3-0 SH 27X BRD (SUTURE) ×1 IMPLANT
SYR 5ML LUER SLIP (SYRINGE) ×2 IMPLANT
SYR CONTROL 10ML LL (SYRINGE) ×2 IMPLANT
TOWEL OR 17X24 6PK STRL BLUE (TOWEL DISPOSABLE) ×2 IMPLANT
TOWEL OR NON WOVEN STRL DISP B (DISPOSABLE) ×2 IMPLANT
TUBE CONNECTING 20X1/4 (TUBING) IMPLANT
YANKAUER SUCT BULB TIP NO VENT (SUCTIONS) IMPLANT

## 2015-12-30 NOTE — Anesthesia Postprocedure Evaluation (Signed)
Anesthesia Post Note  Patient: EMSLEE COLLENS  Procedure(s) Performed: Procedure(s) (LRB): INSERTION PORT-A-CATH WITH [place] (N/A)  Patient location during evaluation: PACU Anesthesia Type: General Level of consciousness: awake and alert Pain management: pain level controlled Vital Signs Assessment: post-procedure vital signs reviewed and stable Respiratory status: spontaneous breathing, nonlabored ventilation, respiratory function stable and patient connected to nasal cannula oxygen Cardiovascular status: blood pressure returned to baseline and stable Postop Assessment: no signs of nausea or vomiting Anesthetic complications: no Comments: CXR looks perfectly clear post op with no opacification, infiltration or pneumothorax.. She does have a sore throat but has stable vital signs, eating a popsickle, conversing and satting 98-100% on Room Air with no shortness of breath.. Stable for discharge home.. Instructions given to return to ED if worsening shortness of breath, light headedness, syncope or worsening chest pain.. Ms. Handley has no further questions or concerns.     Last Vitals:  Vitals:   12/30/15 1630 12/30/15 1645  BP: (!) 128/93 139/84  Pulse: 94 99  Resp: 14 14  Temp:      Last Pain:  Vitals:   12/30/15 1640  TempSrc:   PainSc: 6                  Zenaida Deed

## 2015-12-30 NOTE — H&P (Signed)
. 60 yof with family history in two relatives with breast cancer and one with uterine cancer presents after undergoing abnl mm. she is seen in consult from Dr Everlean Alstrom she has no mass or dc present. she underwent a follow up mm that showed c density breasts. there is irregular 1.5 cm mass in right breast 2 oclock 4 cm from nac and second more posteriorly located irregular mass 1.5 cm posterior and slightly superior to the mass. there is im node in uoq that is present and stable since 2013. on [place] there is a 1.7x1.5x1.3 cm mass at 2 oclock position and there is adjacent smaller satellite nodule measuring 7 mm in size located 7 mm superior to the mass. there is third mass 6 cm from nipple that measures 1.3x1.2x1 cm mass that is 1.2 cm away from larger mass. the outer portion of these masses is 3.9 cm away. she underwent core biopsy of the two larger lesions. 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 she has seen med onc and rad onc as well as genetics. she has undergone an mri that shows three separate masses measuring 1.7x1.5x1.4 cm, 1x0.7x0.6 cm and 1.4x1.3x1.3 cm the masses span an area of 3.7x1.4x1.4 cm. the left breast and the nodes are negative. she is also her 2 positive with one lesion.  Other Problems Illene Regulus, CMA; 12/16/2015 10:17 AM) Breast Cancer Depression Diverticulosis Gastroesophageal Reflux Disease Heart murmur Hemorrhoids Lump In Breast  Past Surgical History Lars Mage Spillers, CMA; 12/16/2015 10:17 AM) Breast Augmentation Bilateral. Breast Biopsy Right.  Diagnostic Studies History Illene Regulus, CMA; 12/16/2015 10:17 AM) Colonoscopy within last year Mammogram within last year Pap Smear 1-5 years ago  Allergies Illene Regulus, CMA; 12/16/2015 10:18 AM) No Known Drug Allergies08/28/2017  Medication History (Alisha Spillers, CMA; 12/16/2015  10:18 AM) No Current Medications Medications Reconciled  Social History Illene Regulus, CMA; 12/16/2015 10:17 AM) Alcohol use Moderate alcohol use. Caffeine use Coffee, Tea. Illicit drug use Remotely quit drug use. Tobacco use Never smoker.  Family History Illene Regulus, [place]; 12/16/2015 10:17 AM) Breast Cancer Family Members In General. Cerebrovascular Accident Father. Colon Polyps Brother. Depression Daughter, Mother. Hypertension Father. Ovarian Cancer Family Members In General.  Pregnancy / Birth History Illene Regulus, [place]; 12/16/2015 10:17 AM) Age at menarche 85 years. Age of menopause 51-55 Contraceptive History Oral contraceptives. Gravida 2 Length (months) of breastfeeding 12-24 Maternal age 62-25 Para 2    Review of Systems Lars Mage Spillers CMA; 12/16/2015 10:17 AM) General Not Present- Appetite Loss, Chills, Fatigue, Fever, Night Sweats, Weight Gain and Weight Loss. Skin Present- Change in Wart/Mole. Not Present- Dryness, Hives, Jaundice, New Lesions, Non-Healing Wounds, Rash and Ulcer. HEENT Present- Wears glasses/contact lenses. Not Present- Earache, Hearing Loss, Hoarseness, Nose Bleed, Oral Ulcers, Ringing in the Ears, Seasonal Allergies, Sinus Pain, Sore Throat, Visual Disturbances and Yellow Eyes. Respiratory Present- Snoring. Not Present- Bloody sputum, Chronic Cough, Difficulty Breathing and Wheezing. Breast Present- Breast Mass. Not Present- Breast Pain, Nipple Discharge and Skin Changes. Cardiovascular Not Present- Chest Pain, Difficulty Breathing Lying Down, Leg Cramps, Palpitations, Rapid Heart Rate, Shortness of Breath and Swelling of Extremities. Gastrointestinal Not Present- Abdominal Pain, Bloating, Bloody Stool, Change in Bowel Habits, Chronic diarrhea, Constipation, Difficulty Swallowing, Excessive gas, Gets full quickly at meals, Hemorrhoids, Indigestion, Nausea, Rectal Pain and Vomiting. Female Genitourinary Not Present-  Frequency, Nocturia, Painful Urination, Pelvic Pain and Urgency. Musculoskeletal  Not Present- Back Pain, Joint Pain, Joint Stiffness, Muscle Pain, Muscle Weakness and Swelling of Extremities. Neurological Not Present- Decreased Memory, Fainting, Headaches, Numbness, Seizures, Tingling, Tremor, Trouble walking and Weakness. Psychiatric Not Present- Anxiety, Bipolar, Change in Sleep Pattern, Depression, Fearful and Frequent crying. Endocrine Not Present- Cold Intolerance, Excessive Hunger, Hair Changes, Heat Intolerance, Hot flashes and New Diabetes. Hematology Not Present- Blood Thinners, Easy Bruising, Excessive bleeding, Gland problems, HIV and Persistent Infections.  Vitals (Alisha Spillers CMA; 12/16/2015 10:18 AM) 12/16/2015 10:17 AM Weight: 162 lb Height: 63in Body Surface Area: 1.77 m Body Mass Index: 28.7 kg/m  Pulse: 104 (Regular)  BP: 118/74 (Sitting, Left Arm, Standard)       Physical Exam Rolm Bookbinder MD; 12/16/2015 1:45 PM) General Mental Status-Alert. Orientation-Oriented X3.  Eye Sclera/Conjunctiva - Bilateral-No scleral icterus.  Chest and Lung Exam Chest and lung exam reveals -on auscultation, normal breath sounds, no adventitious sounds and normal vocal resonance.  Breast Nipples-No Discharge.  Assessment & Plan Rolm Bookbinder MD; 12/27/2015 2:14 PM) BREAST CANCER OF UPPER-INNER QUADRANT OF RIGHT FEMALE BREAST (C50.211) Story: we discussed mri and pathology final report. I think she could undergo surgery now but she is indicated to receive antiher2 therpay it would also be reasonable and might increase options for surgery to do primary systemic therapy. I recommended this today. we discussed port placement with risks/recovery and will plan on that next week. I told her that I would then see her with an mri at end of chemo to decide final surgical plan

## 2015-12-30 NOTE — Anesthesia Procedure Notes (Signed)
Procedure Name: LMA Insertion Date/Time: 12/30/2015 3:11 PM Performed by: Lyndee Leo Pre-anesthesia Checklist: Patient identified, Emergency Drugs available, Suction available and Patient being monitored Patient Re-evaluated:Patient Re-evaluated prior to inductionOxygen Delivery Method: Circle system utilized Preoxygenation: Pre-oxygenation with 100% oxygen Intubation Type: IV induction Ventilation: Mask ventilation without difficulty LMA: LMA inserted LMA Size: 4.0 Number of attempts: 1 Airway Equipment and Method: Bite block Placement Confirmation: positive ETCO2 Tube secured with: Tape Dental Injury: Teeth and Oropharynx as per pre-operative assessment  Comments: Regurgitation of clear liquid through lma p 2 breaths

## 2015-12-30 NOTE — Discharge Instructions (Signed)

## 2015-12-30 NOTE — Interval H&P Note (Signed)
History and Physical Interval Note:  12/30/2015 2:46 PM  Patricia Chandler  has presented today for surgery, with the diagnosis of BREAST CANCER  The various methods of treatment have been discussed with the patient and family. After consideration of risks, benefits and other options for treatment, the patient has consented to  Procedure(s): INSERTION PORT-A-CATH WITH [place] (N/A) as a surgical intervention .  The patient's history has been reviewed, patient examined, no change in status, stable for surgery.  I have reviewed the patient's chart and labs.  Questions were answered to the patient's satisfaction.     Eliam Snapp

## 2015-12-30 NOTE — Op Note (Signed)
Preoperative diagnosis: breast cancer need for venous access Postoperative diagnosis: same as above Procedure: right ij US guided powerport insertion Surgeon: Dr Serita Grammes EBL: minimal Anes: general  Specimens none Complications none Drains none Sponge count correct Dispo to pacu stable  Indications: This is a 44 yof due to begin primary systemic therapy for breast cancer. We discussed port placement.   Procedure: After informed consent was obtained the patient was taken to the operating room. She was given antibiotics. Sequential compression devices were on her legs. She was then placed under general anesthesia with an ETT due to some clear fluid that was almost aspirated. Then she was prepped and draped in the standard sterile surgical fashion. Surgical timeout was then performed.  I used the ultrasound to identify the right internal jugular vein. I then accessed the vein using the ultrasound. This aspirated blood. I then placed the wire. This was confirmed by fluoroscopy and ultrasound to be in the correct position. I created a pocket on the right chest. I tunneled the line between the 2 sites. I then dilated the tract and placed the dilator assembly with the sheath. This was done under fluoroscopy. I then removed the sheath and dilator. The wire was also removed. The line was then pulled back to be in the venacava. I hooked this up to the port. I sutured this into place with 2-0 Prolene in 2 places. This aspirated blood and flushed easily. .This was confirmed with a final fluoroscopy. I then accessed the port.  I then closed this with 2-0 Vicryl and 4-0 Monocryl. Dermabond was placed on both the incisions.A dressing was placed. She tolerated this well and was transferred to the recovery room in stable condition.

## 2015-12-30 NOTE — Anesthesia Procedure Notes (Addendum)
Procedure Name: Intubation Date/Time: 12/30/2015 3:15 PM Performed by: Lyndee Leo Pre-anesthesia Checklist: Patient identified, Emergency Drugs available, Suction available and Patient being monitored Patient Re-evaluated:Patient Re-evaluated prior to inductionOxygen Delivery Method: Circle system utilized Preoxygenation: Pre-oxygenation with 100% oxygen Intubation Type: IV induction Ventilation: Mask ventilation without difficulty Laryngoscope Size: Mac and 3 Grade View: Grade II Tube type: Oral Tube size: 7.0 mm Number of attempts: 1 Airway Equipment and Method: Stylet and Oral airway Placement Confirmation: ETT inserted through vocal cords under direct vision,  positive ETCO2 and breath sounds checked- equal and bilateral Secured at: 21 cm Tube secured with: Tape Dental Injury: Teeth and Oropharynx as per pre-operative assessment

## 2015-12-30 NOTE — Anesthesia Preprocedure Evaluation (Signed)
Anesthesia Evaluation  Patient identified by MRN, date of birth, ID band Patient awake    Reviewed: Allergy & Precautions, H&P , NPO status , Patient's Chart, lab work & pertinent test results  History of Anesthesia Complications Negative for: history of anesthetic complications  Airway Mallampati: II  TM Distance: >3 FB Neck ROM: full    Dental no notable dental hx.    Pulmonary neg pulmonary ROS,    Pulmonary exam normal breath sounds clear to auscultation       Cardiovascular negative cardio ROS Normal cardiovascular exam Rhythm:regular Rate:Normal     Neuro/Psych negative neurological ROS     GI/Hepatic negative GI ROS, Neg liver ROS,   Endo/Other  negative endocrine ROS  Renal/GU negative Renal ROS     Musculoskeletal   Abdominal   Peds  Hematology negative hematology ROS (+)   Anesthesia Other Findings   Reproductive/Obstetrics negative OB ROS                             Anesthesia Physical Anesthesia Plan  ASA: II  Anesthesia Plan: General   Post-op Pain Management:    Induction: Intravenous  Airway Management Planned: LMA  Additional Equipment:   Intra-op Plan:   Post-operative Plan: Extubation in OR  Informed Consent: I have reviewed the patients History and Physical, chart, labs and discussed the procedure including the risks, benefits and alternatives for the proposed anesthesia with the patient or authorized representative who has indicated his/her understanding and acceptance.   Dental Advisory Given  Plan Discussed with: Anesthesiologist, CRNA and Surgeon  Anesthesia Plan Comments:         Anesthesia Quick Evaluation

## 2015-12-30 NOTE — Transfer of Care (Signed)
Immediate Anesthesia Transfer of Care Note  Patient: Patricia Chandler  Procedure(s) Performed: Procedure(s): INSERTION PORT-A-CATH WITH [place] (N/A)  Patient Location: PACU  Anesthesia Type:General  Level of Consciousness: awake, oriented and patient cooperative  Airway & Oxygen Therapy: Patient Spontanous Breathing and Patient connected to face mask oxygen  Post-op Assessment: Report given to RN and Post -op Vital signs reviewed and stable  Post vital signs: Reviewed and stable  Last Vitals:  Vitals:   12/30/15 1324 12/30/15 1558  BP: 134/78 (P) 132/90  Pulse: 78 (!) 104  Resp: 18 17  Temp: 36.8 C (P) 36.6 C    Last Pain:  Vitals:   12/30/15 1324  TempSrc: Oral         Complications: No apparent anesthesia complications

## 2015-12-30 NOTE — Progress Notes (Signed)
Patricia Chandler Spiritual Care Note  Received ms [name]' request for chaplain support from [employer].  Per [name], pt met me in chemo education class and would value opportunity for pastoral support.  Left VM of support, encouraging pt to use my direct dial number to set up a meeting time.  Also plan to visit pt in infusion tomorrow for care and support, but please page if needs arise/circumstances change.  Thank you.   [place], [place], [facility] Pager [phone] Voicemail [phone]

## 2015-12-31 ENCOUNTER — Other Ambulatory Visit (HOSPITAL_BASED_OUTPATIENT_CLINIC_OR_DEPARTMENT_OTHER): Payer: BLUE CROSS/BLUE SHIELD

## 2015-12-31 ENCOUNTER — Encounter: Payer: Self-pay | Admitting: *Deleted

## 2015-12-31 ENCOUNTER — Ambulatory Visit (HOSPITAL_BASED_OUTPATIENT_CLINIC_OR_DEPARTMENT_OTHER): Payer: BLUE CROSS/BLUE SHIELD | Admitting: Hematology and Oncology

## 2015-12-31 ENCOUNTER — Encounter: Payer: Self-pay | Admitting: General Practice

## 2015-12-31 ENCOUNTER — Encounter (HOSPITAL_BASED_OUTPATIENT_CLINIC_OR_DEPARTMENT_OTHER): Payer: Self-pay | Admitting: General Surgery

## 2015-12-31 ENCOUNTER — Encounter: Payer: Self-pay | Admitting: Hematology and Oncology

## 2015-12-31 ENCOUNTER — Other Ambulatory Visit: Payer: Self-pay | Admitting: Hematology and Oncology

## 2015-12-31 ENCOUNTER — Ambulatory Visit (HOSPITAL_BASED_OUTPATIENT_CLINIC_OR_DEPARTMENT_OTHER): Payer: BLUE CROSS/BLUE SHIELD

## 2015-12-31 VITALS — BP 114/76 | HR 84 | Temp 98.2°F | Resp 18

## 2015-12-31 DIAGNOSIS — C50211 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 female breast: Secondary | ICD-10-CM

## 2015-12-31 DIAGNOSIS — Z170 Estrogen receptor positive status [ER+]: Secondary | ICD-10-CM | POA: Diagnosis not present

## 2015-12-31 DIAGNOSIS — Z5112 Encounter for antineoplastic immunotherapy: Secondary | ICD-10-CM | POA: Diagnosis not present

## 2015-12-31 LAB — COMPREHENSIVE METABOLIC PANEL
ALT: 16 U/L (ref 0–55)
ANION GAP: 11 meq/L (ref 3–11)
AST: 16 U/L (ref 5–34)
Albumin: 3.8 g/dL (ref 3.5–5.0)
Alkaline Phosphatase: 61 U/L (ref 40–150)
BILIRUBIN TOTAL: 0.56 mg/dL (ref 0.20–1.20)
BUN: 15.8 mg/dL (ref 7.0–26.0)
CALCIUM: 9.2 mg/dL (ref 8.4–10.4)
CO2: 24 meq/L (ref 22–29)
CREATININE: 0.8 mg/dL (ref 0.6–1.1)
Chloride: 104 mEq/L (ref 98–109)
EGFR: 81 mL/min/{1.73_m2} — ABNORMAL LOW (ref >90)
Glucose: 95 mg/dl (ref 70–140)
Potassium: 3.4 mEq/L — ABNORMAL LOW (ref 3.5–5.1)
Sodium: 139 mEq/L (ref 136–145)
TOTAL PROTEIN: 6.7 g/dL (ref 6.4–8.3)

## 2015-12-31 LAB — CBC WITH DIFFERENTIAL/PLATELET
BASO%: 0.4 % (ref 0.0–2.0)
Basophils Absolute: 0.1 10*3/uL (ref 0.0–0.1)
EOS%: 0 % (ref 0.0–7.0)
Eosinophils Absolute: 0 10*3/uL (ref 0.0–0.5)
HEMATOCRIT: 40.1 % (ref 34.8–46.6)
HGB: 13.3 g/dL (ref 11.6–15.9)
LYMPH#: 2.2 10*3/uL (ref 0.9–3.3)
LYMPH%: 11.4 % — ABNORMAL LOW (ref 14.0–49.7)
MCH: 28.7 pg (ref 25.1–34.0)
MCHC: 33.2 g/dL (ref 31.5–36.0)
MCV: 86.5 fL (ref 79.5–101.0)
MONO#: 1 10*3/uL — AB (ref 0.1–0.9)
MONO%: 5.2 % (ref 0.0–14.0)
NEUT%: 83 % — ABNORMAL HIGH (ref 38.4–76.8)
NEUTROS ABS: 16.2 10*3/uL — AB (ref 1.5–6.5)
PLATELETS: 291 10*3/uL (ref 145–400)
RBC: 4.64 10*6/uL (ref 3.70–5.45)
RDW: 13.7 % (ref 11.2–14.5)
WBC: 19.5 10*3/uL — AB (ref 3.9–10.3)

## 2015-12-31 MED ORDER — SODIUM CHLORIDE 0.9% FLUSH
10.0000 mL | INTRAVENOUS | Status: DC | PRN
  Administered 2015-12-31: 10 mL
  Filled 2015-12-31: qty 10

## 2015-12-31 MED ORDER — SODIUM CHLORIDE 0.9 % IV SOLN
Freq: Once | INTRAVENOUS | Status: AC
  Administered 2015-12-31: 11:00:00 via INTRAVENOUS

## 2015-12-31 MED ORDER — DIPHENHYDRAMINE HCL 25 MG PO CAPS
ORAL_CAPSULE | ORAL | Status: AC
  Filled 2015-12-31: qty 2

## 2015-12-31 MED ORDER — DIPHENHYDRAMINE HCL 25 MG PO CAPS
50.0000 mg | ORAL_CAPSULE | Freq: Once | ORAL | Status: AC
  Administered 2015-12-31: 50 mg via ORAL

## 2015-12-31 MED ORDER — HEPARIN SOD (PORK) LOCK FLUSH 100 UNIT/ML IV SOLN
500.0000 [IU] | Freq: Once | INTRAVENOUS | Status: AC | PRN
  Administered 2015-12-31: 500 [IU]
  Filled 2015-12-31: qty 5

## 2015-12-31 MED ORDER — PALONOSETRON HCL INJECTION 0.25 MG/5ML
INTRAVENOUS | Status: AC
  Filled 2015-12-31: qty 5

## 2015-12-31 MED ORDER — TRASTUZUMAB CHEMO 150 MG IV SOLR
8.0000 mg/kg | Freq: Once | INTRAVENOUS | Status: AC
  Administered 2015-12-31: 588 mg via INTRAVENOUS
  Filled 2015-12-31: qty 28

## 2015-12-31 MED ORDER — ACETAMINOPHEN 325 MG PO TABS
650.0000 mg | ORAL_TABLET | Freq: Once | ORAL | Status: AC
  Administered 2015-12-31: 650 mg via ORAL

## 2015-12-31 MED ORDER — PERTUZUMAB CHEMO INJECTION 420 MG/14ML
840.0000 mg | Freq: Once | INTRAVENOUS | Status: AC
  Administered 2015-12-31: 840 mg via INTRAVENOUS
  Filled 2015-12-31: qty 28

## 2015-12-31 MED ORDER — ACETAMINOPHEN 325 MG PO TABS
ORAL_TABLET | ORAL | Status: AC
  Filled 2015-12-31: qty 2

## 2015-12-31 MED ORDER — DEXAMETHASONE SODIUM PHOSPHATE 100 MG/10ML IJ SOLN: 10.0000 mg | Freq: Once | INTRAMUSCULAR | Status: DC

## 2015-12-31 MED ORDER — SODIUM CHLORIDE 0.9 % IV SOLN
548.5000 mg | Freq: Once | INTRAVENOUS | Status: AC
  Administered 2015-12-31: 550 mg via INTRAVENOUS
  Filled 2015-12-31: qty 55

## 2015-12-31 MED ORDER — PALONOSETRON HCL INJECTION 0.25 MG/5ML
0.2500 mg | Freq: Once | INTRAVENOUS | Status: AC
  Administered 2015-12-31: 0.25 mg via INTRAVENOUS

## 2015-12-31 MED ORDER — SODIUM CHLORIDE 0.9 % IV SOLN
Freq: Once | INTRAVENOUS | Status: AC
  Administered 2015-12-31: 11:00:00 via INTRAVENOUS
  Filled 2015-12-31: qty 5

## 2015-12-31 MED ORDER — PEGFILGRASTIM 6 MG/0.6ML ~~LOC~~ PSKT
6.0000 mg | PREFILLED_SYRINGE | Freq: Once | SUBCUTANEOUS | Status: DC
  Filled 2015-12-31: qty 0.6

## 2015-12-31 MED ORDER — SODIUM CHLORIDE 0.9 % IV SOLN: Freq: Once | INTRAVENOUS | Status: DC

## 2015-12-31 MED ORDER — SODIUM CHLORIDE 0.9 % IV SOLN
75.0000 mg/m2 | Freq: Once | INTRAVENOUS | Status: AC
  Administered 2015-12-31: 140 mg via INTRAVENOUS
  Filled 2015-12-31: qty 14

## 2015-12-31 NOTE — Patient Instructions (Addendum)
[facility] Discharge Instructions for Patients Receiving Chemotherapy  Today you received the following chemotherapy agents: Herceptin, Perjeta, Taxotere, Carboplatin.  To help prevent nausea and vomiting after your treatment, we encourage you to take your nausea medication as prescribed.   If you develop nausea and vomiting that is not controlled by your nausea medication, call the clinic.   BELOW ARE SYMPTOMS THAT SHOULD BE REPORTED IMMEDIATELY:  *FEVER GREATER THAN 100.5 F  *CHILLS WITH OR WITHOUT FEVER  NAUSEA AND VOMITING THAT IS NOT CONTROLLED WITH YOUR NAUSEA MEDICATION  *UNUSUAL SHORTNESS OF BREATH  *UNUSUAL BRUISING OR BLEEDING  TENDERNESS IN MOUTH AND THROAT WITH OR WITHOUT PRESENCE OF ULCERS  *URINARY PROBLEMS  *BOWEL PROBLEMS  UNUSUAL RASH Items with * indicate a potential emergency and should be followed up as soon as possible.  Feel free to call the clinic you have any questions or concerns. The clinic phone number is [phone].  Please show the [facility] at check-in to the Emergency Department and triage nurse.  (Herceptin) Trastuzumab injection for infusion What is this medicine? TRASTUZUMAB (tras TOO zoo mab) is a monoclonal antibody. It is used to treat breast cancer and stomach cancer. This medicine may be used for other purposes; ask your health care provider or pharmacist if you have questions. What should I tell my health care provider before I take this medicine? They need to know if you have any of these conditions: -heart disease -heart failure -infection (especially a virus infection such as chickenpox, cold sores, or herpes) -lung or breathing disease, like asthma -recent or ongoing radiation therapy -an unusual or allergic reaction to trastuzumab, benzyl alcohol, or other medications, foods, dyes, or preservatives -pregnant or trying to get pregnant -breast-feeding How should I use this medicine? This drug is  given as an infusion into a vein. It is administered in a hospital or clinic by a specially trained health care professional. Talk to your pediatrician regarding the use of this medicine in children. This medicine is not approved for use in children. Overdosage: If you think you have taken too much of this medicine contact a poison control center or emergency room at once. NOTE: This medicine is only for you. Do not share this medicine with others. What if I miss a dose? It is important not to miss a dose. Call your doctor or health care professional if you are unable to keep an appointment. What may interact with this medicine? -doxorubicin -warfarin This list may not describe all possible interactions. Give your health care provider a list of all the medicines, herbs, non-prescription drugs, or dietary supplements you use. Also tell them if you smoke, drink alcohol, or use illegal drugs. Some items may interact with your medicine. What should I watch for while using this medicine? Visit your doctor for checks on your progress. Report any side effects. Continue your course of treatment even though you feel ill unless your doctor tells you to stop. Call your doctor or health care professional for advice if you get a fever, chills or sore throat, or other symptoms of a cold or flu. Do not treat yourself. Try to avoid being around people who are sick. You may experience fever, chills and shaking during your first infusion. These effects are usually mild and can be treated with other medicines. Report any side effects during the infusion to your health care professional. Fever and chills usually do not happen with later infusions. Do not become pregnant while taking  this medicine or for 7 months after stopping it. Women should inform their doctor if they wish to become pregnant or think they might be pregnant. Women of child-bearing potential will need to have a negative pregnancy test before starting  this medicine. There is a potential for serious side effects to an unborn child. Talk to your health care professional or pharmacist for more information. Do not breast-feed an infant while taking this medicine or for 7 months after stopping it. Women must use effective birth control with this medicine. What side effects may I notice from receiving this medicine? Side effects that you should report to your doctor or other health care professional as soon as possible: -breathing difficulties -chest pain or palpitations -cough -dizziness or fainting -fever or chills, sore throat -skin rash, itching or hives -swelling of the legs or ankles -unusually weak or tired Side effects that usually do not require medical attention (report to your doctor or other health care professional if they continue or are bothersome): -loss of appetite -headache -muscle aches -nausea This list may not describe all possible side effects. Call your doctor for medical advice about side effects. You may report side effects to FDA at 1-800-FDA-1088. Where should I keep my medicine? This drug is given in a hospital or clinic and will not be stored at home. NOTE: This sheet is a summary. It may not cover all possible information. If you have questions about this medicine, talk to your doctor, pharmacist, or health care provider.    2016, [employer] Standard. (2014-07-13 11:49:32)  (Perjeta) Pertuzumab injection What is this medicine? PERTUZUMAB (per TOOZ ue mab) is a monoclonal antibody. It is used to treat breast cancer. This medicine may be used for other purposes; ask your health care provider or pharmacist if you have questions. What should I tell my health care provider before I take this medicine? They need to know if you have any of these conditions: -heart disease -heart failure -high blood pressure -history of irregular heart beat -recent or ongoing radiation therapy -an unusual or allergic reaction  to pertuzumab, other medicines, foods, dyes, or preservatives -pregnant or trying to get pregnant -breast-feeding How should I use this medicine? This medicine is for infusion into a vein. It is given by a health care professional in a hospital or clinic setting. Talk to your pediatrician regarding the use of this medicine in children. Special care may be needed. Overdosage: If you think you have taken too much of this medicine contact a poison control center or emergency room at once. NOTE: This medicine is only for you. Do not share this medicine with others. What if I miss a dose? It is important not to miss your dose. Call your doctor or health care professional if you are unable to keep an appointment. What may interact with this medicine? Interactions are not expected. Give your health care provider a list of all the medicines, herbs, non-prescription drugs, or dietary supplements you use. Also tell them if you smoke, drink alcohol, or use illegal drugs. Some items may interact with your medicine. This list may not describe all possible interactions. Give your health care provider a list of all the medicines, herbs, non-prescription drugs, or dietary supplements you use. Also tell them if you smoke, drink alcohol, or use illegal drugs. Some items may interact with your medicine. What should I watch for while using this medicine? Your condition will be monitored carefully while you are receiving this medicine. Report any side effects.  Continue your course of treatment even though you feel ill unless your doctor tells you to stop. Do not become pregnant while taking this medicine or for 7 months after stopping it. Women should inform their doctor if they wish to become pregnant or think they might be pregnant. Women of child-bearing potential will need to have a negative pregnancy test before starting this medicine. There is a potential for serious side effects to an unborn child. Talk to your  health care professional or pharmacist for more information. Do not breast-feed an infant while taking this medicine or for 7 months after stopping it. Women must use effective birth control with this medicine. Call your doctor or health care professional for advice if you get a fever, chills or sore throat, or other symptoms of a cold or flu. Do not treat yourself. Try to avoid being around people who are sick. You may experience fever, chills, and headache during the infusion. Report any side effects during the infusion to your health care professional. What side effects may I notice from receiving this medicine? Side effects that you should report to your doctor or health care professional as soon as possible: -breathing problems -chest pain or palpitations -dizziness -feeling faint or lightheaded -fever or chills -skin rash, itching or hives -sore throat -swelling of the face, lips, or tongue -swelling of the legs or ankles -unusually weak or tired Side effects that usually do not require medical attention (Report these to your doctor or health care professional if they continue or are bothersome.): -diarrhea -hair loss -nausea, vomiting -tiredness This list may not describe all possible side effects. Call your doctor for medical advice about side effects. You may report side effects to FDA at 1-800-FDA-1088. Where should I keep my medicine? This drug is given in a hospital or clinic and will not be stored at home. NOTE: This sheet is a summary. It may not cover all possible information. If you have questions about this medicine, talk to your doctor, pharmacist, or health care provider.    2016, [employer] Standard. (2014-07-13 16:07:57)  (Taxotere) Docetaxel injection What is this medicine? DOCETAXEL (doe se TAX el) is a chemotherapy drug. It targets fast dividing cells, like cancer cells, and causes these cells to die. This medicine is used to treat many types of cancers like  breast cancer, certain stomach cancers, head and neck cancer, lung cancer, and prostate cancer. This medicine may be used for other purposes; ask your health care provider or pharmacist if you have questions. What should I tell my health care provider before I take this medicine? They need to know if you have any of these conditions: -infection (especially a virus infection such as chickenpox, cold sores, or herpes) -liver disease -low blood counts, like low white cell, platelet, or red cell counts -an unusual or allergic reaction to docetaxel, polysorbate 80, other chemotherapy agents, other medicines, foods, dyes, or preservatives -pregnant or trying to get pregnant -breast-feeding How should I use this medicine? This drug is given as an infusion into a vein. It is administered in a hospital or clinic by a specially trained health care professional. Talk to your pediatrician regarding the use of this medicine in children. Special care may be needed. Overdosage: If you think you have taken too much of this medicine contact a poison control center or emergency room at once. NOTE: This medicine is only for you. Do not share this medicine with others. What if I miss a dose? It is important  not to miss your dose. Call your doctor or health care professional if you are unable to keep an appointment. What may interact with this medicine? -cyclosporine -erythromycin -ketoconazole -medicines to increase blood counts like filgrastim, pegfilgrastim, sargramostim -vaccines Talk to your doctor or health care professional before taking any of these medicines: -acetaminophen -aspirin -ibuprofen -ketoprofen -naproxen This list may not describe all possible interactions. Give your health care provider a list of all the medicines, herbs, non-prescription drugs, or dietary supplements you use. Also tell them if you smoke, drink alcohol, or use illegal drugs. Some items may interact with your  medicine. What should I watch for while using this medicine? Your condition will be monitored carefully while you are receiving this medicine. You will need important blood work done while you are taking this medicine. This drug may make you feel generally unwell. This is not uncommon, as chemotherapy can affect healthy cells as well as cancer cells. Report any side effects. Continue your course of treatment even though you feel ill unless your doctor tells you to stop. In some cases, you may be given additional medicines to help with side effects. Follow all directions for their use. Call your doctor or health care professional for advice if you get a fever, chills or sore throat, or other symptoms of a cold or flu. Do not treat yourself. This drug decreases your body's ability to fight infections. Try to avoid being around people who are sick. This medicine may increase your risk to bruise or bleed. Call your doctor or health care professional if you notice any unusual bleeding. This medicine may contain alcohol in the product. You may get drowsy or dizzy. Do not drive, use machinery, or do anything that needs mental alertness until you know how this medicine affects you. Do not stand or sit up quickly, especially if you are an older patient. This reduces the risk of dizzy or fainting spells. Avoid alcoholic drinks. Do not become pregnant while taking this medicine. Women should inform their doctor if they wish to become pregnant or think they might be pregnant. There is a potential for serious side effects to an unborn child. Talk to your health care professional or pharmacist for more information. Do not breast-feed an infant while taking this medicine. What side effects may I notice from receiving this medicine? Side effects that you should report to your doctor or health care professional as soon as possible: -allergic reactions like skin rash, itching or hives, swelling of the face, lips, or  tongue -low blood counts - This drug may decrease the number of white blood cells, red blood cells and platelets. You may be at increased risk for infections and bleeding. -signs of infection - fever or chills, cough, sore throat, pain or difficulty passing urine -signs of decreased platelets or bleeding - bruising, pinpoint red spots on the skin, black, tarry stools, nosebleeds -signs of decreased red blood cells - unusually weak or tired, fainting spells, lightheadedness -breathing problems -fast or irregular heartbeat -low blood pressure -mouth sores -nausea and vomiting -pain, swelling, redness or irritation at the injection site -pain, tingling, numbness in the hands or feet -swelling of the ankle, feet, hands -weight gain Side effects that usually do not require medical attention (report to your prescriber or health care professional if they continue or are bothersome): -bone pain -complete hair loss including hair on your head, underarms, pubic hair, eyebrows, and eyelashes -diarrhea -excessive tearing -changes in the color of fingernails -loosening of the  fingernails -nausea -muscle pain -red flush to skin -sweating -weak or tired This list may not describe all possible side effects. Call your doctor for medical advice about side effects. You may report side effects to FDA at 1-800-FDA-1088. Where should I keep my medicine? This drug is given in a hospital or clinic and will not be stored at home. NOTE: This sheet is a summary. It may not cover all possible information. If you have questions about this medicine, talk to your doctor, pharmacist, or health care provider.    2016, [employer] Standard. (2014-04-23 16:04:57)  Carboplatin injection What is this medicine? CARBOPLATIN (KAR boe pla tin) is a chemotherapy drug. It targets fast dividing cells, like cancer cells, and causes these cells to die. This medicine is used to treat ovarian cancer and many other  cancers. This medicine may be used for other purposes; ask your health care provider or pharmacist if you have questions. What should I tell my health care provider before I take this medicine? They need to know if you have any of these conditions: -blood disorders -hearing problems -kidney disease -recent or ongoing radiation therapy -an unusual or allergic reaction to carboplatin, cisplatin, other chemotherapy, other medicines, foods, dyes, or preservatives -pregnant or trying to get pregnant -breast-feeding How should I use this medicine? This drug is usually given as an infusion into a vein. It is administered in a hospital or clinic by a specially trained health care professional. Talk to your pediatrician regarding the use of this medicine in children. Special care may be needed. Overdosage: If you think you have taken too much of this medicine contact a poison control center or emergency room at once. NOTE: This medicine is only for you. Do not share this medicine with others. What if I miss a dose? It is important not to miss a dose. Call your doctor or health care professional if you are unable to keep an appointment. What may interact with this medicine? -medicines for seizures -medicines to increase blood counts like filgrastim, pegfilgrastim, sargramostim -some antibiotics like amikacin, gentamicin, neomycin, streptomycin, tobramycin -vaccines Talk to your doctor or health care professional before taking any of these medicines: -acetaminophen -aspirin -ibuprofen -ketoprofen -naproxen This list may not describe all possible interactions. Give your health care provider a list of all the medicines, herbs, non-prescription drugs, or dietary supplements you use. Also tell them if you smoke, drink alcohol, or use illegal drugs. Some items may interact with your medicine. What should I watch for while using this medicine? Your condition will be monitored carefully while you are  receiving this medicine. You will need important blood work done while you are taking this medicine. This drug may make you feel generally unwell. This is not uncommon, as chemotherapy can affect healthy cells as well as cancer cells. Report any side effects. Continue your course of treatment even though you feel ill unless your doctor tells you to stop. In some cases, you may be given additional medicines to help with side effects. Follow all directions for their use. Call your doctor or health care professional for advice if you get a fever, chills or sore throat, or other symptoms of a cold or flu. Do not treat yourself. This drug decreases your body's ability to fight infections. Try to avoid being around people who are sick. This medicine may increase your risk to bruise or bleed. Call your doctor or health care professional if you notice any unusual bleeding. Be careful brushing and flossing your  teeth or using a toothpick because you may get an infection or bleed more easily. If you have any dental work done, tell your dentist you are receiving this medicine. Avoid taking products that contain aspirin, acetaminophen, ibuprofen, naproxen, or ketoprofen unless instructed by your doctor. These medicines may hide a fever. Do not become pregnant while taking this medicine. Women should inform their doctor if they wish to become pregnant or think they might be pregnant. There is a potential for serious side effects to an unborn child. Talk to your health care professional or pharmacist for more information. Do not breast-feed an infant while taking this medicine. What side effects may I notice from receiving this medicine? Side effects that you should report to your doctor or health care professional as soon as possible: -allergic reactions like skin rash, itching or hives, swelling of the face, lips, or tongue -signs of infection - fever or chills, cough, sore throat, pain or difficulty passing  urine -signs of decreased platelets or bleeding - bruising, pinpoint red spots on the skin, black, tarry stools, nosebleeds -signs of decreased red blood cells - unusually weak or tired, fainting spells, lightheadedness -breathing problems -changes in hearing -changes in vision -chest pain -high blood pressure -low blood counts - This drug may decrease the number of white blood cells, red blood cells and platelets. You may be at increased risk for infections and bleeding. -nausea and vomiting -pain, swelling, redness or irritation at the injection site -pain, tingling, numbness in the hands or feet -problems with balance, talking, walking -trouble passing urine or change in the amount of urine Side effects that usually do not require medical attention (report to your doctor or health care professional if they continue or are bothersome): -hair loss -loss of appetite -metallic taste in the mouth or changes in taste This list may not describe all possible side effects. Call your doctor for medical advice about side effects. You may report side effects to FDA at 1-800-FDA-1088. Where should I keep my medicine? This drug is given in a hospital or clinic and will not be stored at home. NOTE: This sheet is a summary. It may not cover all possible information. If you have questions about this medicine, talk to your doctor, pharmacist, or health care provider.    2016, [employer] Standard. (2007-07-12 14:38:05)

## 2015-12-31 NOTE — Progress Notes (Addendum)
Patient Care Team: Kelton Pillar, MD as PCP - General (Family Medicine)  DIAGNOSIS: No matching staging information was found for the patient.  SUMMARY OF ONCOLOGIC HISTORY:   Breast cancer of upper-inner quadrant of right female breast ([place])   12/05/2015 Mammogram    Right breast mass 2:00 4 cm from nipple: 1.7 cm, T1c N0 stage IA; right breast 2:00 6 cm from nipple 1.3 cm mass, 7 mm satellite nodule between the 2 masses, T1 cN0 stage IA      12/06/2015 Initial Diagnosis    Right breast biopsy 2:00 6 cm from nipple: Grade 2-3 invasive ductal cancer, ER 90%, PR 2%, Ki-67 80%, HER-2 positive ratio 2.07; right biopsy 2:00 4 cm from nipple: IDC with DCIS, ER 95%, PR 10%, Ki-67 90%, HER-2 negative ratio 1.27      12/18/2015 Breast MRI    3 enhancing masses in the upper inner quadrant right breast spanning an area 3.7 cm (1.7 cm, 1 cm, 1.4 cm) no lymph node enlargement      12/31/2015 -  Neo-Adjuvant Chemotherapy    TCH Perjeta 6 cycles followed by Herceptin maintenance for 1 year       CHIEF COMPLIANT: Cycle 1 TCH Perjeta  INTERVAL HISTORY: Patricia Chandler is a 60 year old with above-mentioned history of right breast masses currently starting neoadjuvant chemotherapy with [place] Perjeta. Port has been placed and it has been left accessed. She is quite uncomfortable on that side. She also complains of a discharge in the back of the throat and mildly greenish sputum. Most likely she aspirated during this procedure.  REVIEW OF SYSTEMS:   Constitutional: Denies fevers, chills or abnormal weight loss Eyes: Denies blurriness of vision Ears, nose, mouth, throat, and face: Denies mucositis or sore throat Respiratory: Cough with expectoration probably from aspiration denies any fevers Cardiovascular: Denies palpitation, chest discomfort Gastrointestinal:  Denies nausea, heartburn or change in bowel habits Skin: Denies abnormal skin rashes Lymphatics: Denies new lymphadenopathy or easy  bruising Neurological:Denies numbness, tingling or new weaknesses Behavioral/Psych: Anxious difficulty with sleeping Extremities: No lower extremity edema  All other systems were reviewed with the patient and are negative.  I have reviewed the past medical history, past surgical history, social history and family history with the patient and they are unchanged from previous note.  ALLERGIES:  has No Known Allergies.  MEDICATIONS:  Current Outpatient Prescriptions  Medication Sig Dispense Refill  . Ascorbic Acid (VITAMIN C PO) Take by mouth.    . calcium carbonate (OS-CAL) 600 MG TABS tablet Take 600 mg by mouth daily.    . Cholecalciferol (VITAMIN D3) 1000 units CAPS Take 4,000 Units by mouth daily.    Mar[place] Kitchen dexamethasone (DECADRON) 4 MG tablet Take 1 tablet (4 mg total) by mouth daily. 1 tablet daily before chemotherapy and 1 tablet day after chemotherapy 15 tablet 0  . HYDROcodone-acetaminophen (NORCO) 10-325 MG tablet Take 1 tablet by mouth every 6 (six) hours as needed. 10 tablet 0  . lidocaine-prilocaine (EMLA) cream Apply to affected area once 30 g 3  . LORazepam (ATIVAN) 0.5 MG tablet Take 1 tablet (0.5 mg total) by mouth every 6 (six) hours as needed (Nausea or vomiting). 30 tablet 0  . magnesium 30 MG tablet Take 30 mg by mouth 2 (two) times daily.    . Omega-3 Fatty Acids (FISH OIL PO) Take by mouth.    . ondansetron (ZOFRAN) 8 MG tablet Take 1 tablet (8 mg total) by mouth 2 (two) times daily as needed  for refractory nausea / vomiting. Start on day 3 after chemo. 30 tablet 1  . Probiotic Product (PROBIOTIC DAILY PO) Take by mouth.    . prochlorperazine (COMPAZINE) 10 MG tablet Take 1 tablet (10 mg total) by mouth every 6 (six) hours as needed (Nausea or vomiting). 30 tablet 1   No current facility-administered medications for this visit.     PHYSICAL EXAMINATION: ECOG PERFORMANCE STATUS: 1 - Symptomatic but completely ambulatory  Vitals:   12/31/15 0902  BP: 109/76  Pulse:  (!) 102  Resp: 18  Temp: 98.4 F (36.9 C)   Filed Weights   12/31/15 0902  Weight: 162 lb (73.5 kg)    GENERAL:alert, no distress and comfortable SKIN: skin color, texture, turgor are normal, no rashes or significant lesions EYES: normal, Conjunctiva are pink and non-injected, sclera clear OROPHARYNX:no exudate, no erythema and lips, buccal mucosa, and tongue normal  NECK: supple, thyroid normal size, non-tender, without nodularity LYMPH:  no palpable lymphadenopathy in the cervical, axillary or inguinal LUNGS: clear to auscultation and percussion with normal breathing effort HEART: regular rate & rhythm and no murmurs and no lower extremity edema ABDOMEN:abdomen soft, non-tender and normal bowel sounds MUSCULOSKELETAL:no cyanosis of digits and no clubbing  NEURO: alert & oriented x 3 with fluent speech, no focal motor/sensory deficits EXTREMITIES: No lower extremity edema  LABORATORY DATA:  I have reviewed the data as listed   Chemistry      Component Value Date/Time   NA 139 12/31/2015 0847   K 3.4 (L) 12/31/2015 0847   CL 104 02/04/2012 1648   CO2 24 12/31/2015 0847   BUN 15.8 12/31/2015 0847   CREATININE 0.8 12/31/2015 0847      Component Value Date/Time   CALCIUM 9.2 12/31/2015 0847   ALKPHOS 61 12/31/2015 0847   AST 16 12/31/2015 0847   ALT 16 12/31/2015 0847   BILITOT 0.56 12/31/2015 0847       Lab Results  Component Value Date   WBC 19.5 (H) 12/31/2015   HGB 13.3 12/31/2015   HCT 40.1 12/31/2015   MCV 86.5 12/31/2015   PLT 291 12/31/2015   NEUTROABS 16.2 (H) 12/31/2015     ASSESSMENT & PLAN:  Breast cancer of upper-inner quadrant of right female breast ([facility]) Right breast biopsy 2:00 6 cm from nipple: Grade 2-3 invasive ductal cancer, ER 90%, PR 2%, Ki-67 80%, HER-2 positive ratio 2.07; right biopsy 2:00 4 cm from nipple: IDC with DCIS, ER 95%, PR 10%, Ki-67 90%, HER-2 negative ratio 1.27 Breast MRI 12/18/2015: 3 enhancing masses in the upper inner  quadrant right breast spanning an area 3.7 cm (1.7 cm, 1 cm, 1.4 cm) no lymph node enlargement Clinical stage: T2N0 (stage 2A)  Treatment plan: 1. Genetic counseling 2. Neoadjuvant chemotherapy. ([place] Perjeta 6 cycles followed by Herceptin maintenance for one year) 3. Breast conserving surgery followed by 4. Adjuvant radiation therapy followed by 5. Adjuvant antiestrogen therapy -------------------------------------------------------------------------------------------------------------------------------------------------- Current treatment: Cycle 1 day 1 TCH Perjeta Echocardiogram 12/26/2015: EF 60-65% Antiemetics were reviewed Labs were reviewed Monitoring closely for chemotherapy toxicities  Return to clinic in 1 week for toxicity check and follow-up    No orders of the defined types were placed in this encounter.  The patient has a good understanding of the overall plan. she agrees with it. she will call with any problems that may develop before the next visit here.   Rulon Eisenmenger, MD 12/31/15   Addendum: Patient's insurance company [employer] refused to cover  Neulasta. I called and discussed with their oncologist Dr.Kumari, who returned out a list of comorbidities. Since our patient did not have any of those comorbidities, she coated that unless the patient comes down with neutropenic fever they will not be covering for Neulasta. It is frustrating that in spite of explaining the rationale, she was not willing to reconsider. It Is also strange that she would not give me her full name. We will inform the patient and will remove Neulasta from our treatment plan.

## 2015-12-31 NOTE — Progress Notes (Signed)
Enrolled pt in the Neulasta First Step program.  Faxed signed form and activated card today.  °

## 2015-12-31 NOTE — Progress Notes (Signed)
[facility] Spiritual Care Note  Met with Patricia Chandler and her husband Patricia Chandler in infusion.  They identified many emotions that that they are working to process, both individually and together as a couple.  Patricia Chandler would like to explore additional coping tools to help them make make meaning and enjoy life as much as possible in the midst of dx/tx.    Referring for [place] and to counseling intern Rosana Fret per pt request.  Patricia Chandler plans to phone me to set up [place] appt after she acclimates to first chemo tx.   [place], [place], [facility] Pager [phone] Voicemail [phone]

## 2015-12-31 NOTE — Assessment & Plan Note (Signed)
Right breast biopsy 2:00 6 cm from nipple: Grade 2-3 invasive ductal cancer, ER 90%, PR 2%, Ki-67 80%, HER-2 positive ratio 2.07; right biopsy 2:00 4 cm from nipple: IDC with DCIS, ER 95%, PR 10%, Ki-67 90%, HER-2 negative ratio 1.27 Breast MRI 12/18/2015: 3 enhancing masses in the upper inner quadrant right breast spanning an area 3.7 cm (1.7 cm, 1 cm, 1.4 cm) no lymph node enlargement Clinical stage: T2N0 (stage 2A)  Treatment plan: 1. Genetic counseling 2. Neoadjuvant chemotherapy. (TCH Perjeta 6 cycles followed by Herceptin maintenance for one year) 3. Breast conserving surgery followed by 4. Adjuvant radiation therapy followed by 5. Adjuvant antiestrogen therapy -------------------------------------------------------------------------------------------------------------------------------------------------- Current treatment: Cycle 1 day 1 TCH Perjeta Echocardiogram 12/26/2015: EF 60-65% Antiemetics were reviewed Labs were reviewed Monitoring closely for chemotherapy toxicities  Return to clinic in 1 week for toxicity check and follow-up  

## 2016-01-02 ENCOUNTER — Telehealth: Payer: Self-pay | Admitting: *Deleted

## 2016-01-02 ENCOUNTER — Encounter: Payer: Self-pay | Admitting: Hematology and Oncology

## 2016-01-02 NOTE — Progress Notes (Signed)
Pt is approved for the $1000 [employer] grant.  

## 2016-01-02 NOTE — Telephone Encounter (Signed)
"  I need to know what you all wouldl like me to do about my cough.  Immediately I started coughing after the port-a-cath was placed.  They did not seem worried as the CXR was normal.  Today is day four.  I'm hoarse.  I cough and initially green was it's color and now it's thick white.  I do not think I have a fever and have not tried anything OTC without the doctor's permission."  Offered to wait for her to check temperature but says she will call back if she has a temp.  Treating the symptom of cough advised by this nurse and I will message her doctor.  Has previously tried Mucinex and robitussin and will try these.  Asked that she call to keep [place] posted of any changes.  Scheduled F/U 01-07-2016.

## 2016-01-06 NOTE — Assessment & Plan Note (Signed)
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 Genetic counseling 2. Neoadjuvant chemotherapy. ([place] Perjeta 6 cycles followed by Herceptin maintenance for one year) 3. Breast conserving surgery followed by 4. Adjuvant radiation therapy followed by 5. Adjuvant antiestrogen therapy -------------------------------------------------------------------------------------------------------------------------------------------------- Current treatment: Cycle 1 day 8 [place] Perjeta Echocardiogram 12/26/2015: EF 60-65% Chemotherapy toxicities:  Monitoring closely for chemotherapy toxicities  Return to clinic in 2 week for cycle 2

## 2016-01-07 ENCOUNTER — Encounter: Payer: Self-pay | Admitting: Hematology and Oncology

## 2016-01-07 ENCOUNTER — Other Ambulatory Visit (HOSPITAL_BASED_OUTPATIENT_CLINIC_OR_DEPARTMENT_OTHER): Payer: BLUE CROSS/BLUE SHIELD

## 2016-01-07 ENCOUNTER — Encounter: Payer: Self-pay | Admitting: *Deleted

## 2016-01-07 ENCOUNTER — Ambulatory Visit (HOSPITAL_BASED_OUTPATIENT_CLINIC_OR_DEPARTMENT_OTHER): Payer: BLUE CROSS/BLUE SHIELD | Admitting: Hematology and Oncology

## 2016-01-07 ENCOUNTER — Encounter: Payer: Self-pay | Admitting: General Practice

## 2016-01-07 DIAGNOSIS — R509 Fever, unspecified: Secondary | ICD-10-CM | POA: Diagnosis not present

## 2016-01-07 DIAGNOSIS — C50211 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 female breast: Secondary | ICD-10-CM

## 2016-01-07 DIAGNOSIS — D709 Neutropenia, unspecified: Secondary | ICD-10-CM | POA: Diagnosis not present

## 2016-01-07 DIAGNOSIS — E860 Dehydration: Secondary | ICD-10-CM | POA: Diagnosis not present

## 2016-01-07 LAB — COMPREHENSIVE METABOLIC PANEL
ALBUMIN: 3.7 g/dL (ref 3.5–5.0)
ALK PHOS: 66 U/L (ref 40–150)
ALT: 52 U/L (ref 0–55)
AST: 35 U/L — AB (ref 5–34)
Anion Gap: 10 mEq/L (ref 3–11)
BILIRUBIN TOTAL: 0.76 mg/dL (ref 0.20–1.20)
BUN: 14.2 mg/dL (ref 7.0–26.0)
CALCIUM: 9.5 mg/dL (ref 8.4–10.4)
CHLORIDE: 101 meq/L (ref 98–109)
CO2: 25 mEq/L (ref 22–29)
CREATININE: 0.8 mg/dL (ref 0.6–1.1)
EGFR: 77 mL/min/{1.73_m2} — ABNORMAL LOW (ref >90)
Glucose: 94 mg/dl (ref 70–140)
Potassium: 4 mEq/L (ref 3.5–5.1)
Sodium: 136 mEq/L (ref 136–145)
TOTAL PROTEIN: 6.7 g/dL (ref 6.4–8.3)

## 2016-01-07 LAB — CBC WITH DIFFERENTIAL/PLATELET
BASO%: 3 % — AB (ref 0.0–2.0)
Basophils Absolute: 0 10*3/uL (ref 0.0–0.1)
EOS%: 1.6 % (ref 0.0–7.0)
Eosinophils Absolute: 0 10*3/uL (ref 0.0–0.5)
HEMATOCRIT: 38.4 % (ref 34.8–46.6)
HEMOGLOBIN: 13 g/dL (ref 11.6–15.9)
LYMPH#: 0.7 10*3/uL — AB (ref 0.9–3.3)
LYMPH%: 59.9 % — ABNORMAL HIGH (ref 14.0–49.7)
MCH: 29.2 pg (ref 25.1–34.0)
MCHC: 34 g/dL (ref 31.5–36.0)
MCV: 85.9 fL (ref 79.5–101.0)
MONO#: 0 10*3/uL — AB (ref 0.1–0.9)
MONO%: 3.9 % (ref 0.0–14.0)
NEUT#: 0.4 10*3/uL — CL (ref 1.5–6.5)
NEUT%: 31.6 % — ABNORMAL LOW (ref 38.4–76.8)
Platelets: 189 10*3/uL (ref 145–400)
RBC: 4.47 10*6/uL (ref 3.70–5.45)
RDW: 13.4 % (ref 11.2–14.5)
WBC: 1.2 10*3/uL — ABNORMAL LOW (ref 3.9–10.3)

## 2016-01-07 MED ORDER — ALPRAZOLAM 0.5 MG PO TABS: 0.5000 mg | ORAL_TABLET | Freq: Every evening | ORAL | 0 refills | Status: DC | PRN

## 2016-01-07 MED FILL — ALPRAZolam 0.5 MG TABS: 0.5 | 60 days supply | Qty: 60 | Fill #0

## 2016-01-07 NOTE — Progress Notes (Signed)
Patient Care Team: Kelton Pillar, MD as PCP - General (Family Medicine)  SUMMARY OF ONCOLOGIC HISTORY:   Breast cancer of upper-inner quadrant of right female breast ([place])   12/05/2015 Mammogram    Right breast mass 2:00 4 cm from nipple: 1.7 cm, T1c N0 stage IA; right breast 2:00 6 cm from nipple 1.3 cm mass, 7 mm satellite nodule between the 2 masses, T1 cN0 stage IA      12/06/2015 Initial Diagnosis    Right breast biopsy 2:00 6 cm from nipple: Grade 2-3 invasive ductal cancer, ER 90%, PR 2%, Ki-67 80%, HER-2 positive ratio 2.07; right biopsy 2:00 4 cm from nipple: IDC with DCIS, ER 95%, PR 10%, Ki-67 90%, HER-2 negative ratio 1.27      12/18/2015 Breast MRI    3 enhancing masses in the upper inner quadrant right breast spanning an area 3.7 cm (1.7 cm, 1 cm, 1.4 cm) no lymph node enlargement      12/31/2015 -  Neo-Adjuvant Chemotherapy    TCH Perjeta 6 cycles followed by Herceptin maintenance for 1 year       CHIEF COMPLIANT: Cycle 1 day 8 TCH Perjeta   INTERVAL HISTORY: Patricia Chandler is a 60 year old with above-mentioned history of right breast cancer currently on neoadjuvant chemotherapy with [place] Perjeta. Today is cycle 1 day 8. Patient continues to have vocal cord dysfunction and had seen ENT. She was noted to have acid reflux and was prescribed Protonix. She is extremely frustrated about lack of voice. She did quite well from chemotherapy standpoint with mild nausea but no vomiting. Fatigue that lasted 2-3 days. Intermittent diarrhea alternating with constipation especially when she has to take MiraLAX for the constipation she stands to get diarrhea. She has taken Imodium 1 or 2 days during the past week.  REVIEW OF SYSTEMS:   Constitutional: Denies fevers, chills or abnormal weight loss Eyes: Denies blurriness of vision Ears, nose, mouth, throat, and face: Sensitive mouth but no ulcers, mild vocal cord dysfunction Respiratory: Denies cough, dyspnea or  wheezes Cardiovascular: Denies palpitation, chest discomfort Gastrointestinal: Mild nausea, constipation alternating with diarrhea Skin: Denies abnormal skin rashes Lymphatics: Denies new lymphadenopathy or easy bruising Neurological:Denies numbness, tingling or new weaknesses Behavioral/Psych: Mood is stable, no new changes  Extremities: No lower extremity edema  All other systems were reviewed with the patient and are negative.  I have reviewed the past medical history, past surgical history, social history and family history with the patient and they are unchanged from previous note.  ALLERGIES:  has No Known Allergies.  MEDICATIONS:  Current Outpatient Prescriptions  Medication Sig Dispense Refill  . Ascorbic Acid (VITAMIN C PO) Take by mouth.    . calcium carbonate (OS-CAL) 600 MG TABS tablet Take 600 mg by mouth daily.    . Cholecalciferol (VITAMIN D3) 1000 units CAPS Take 4,000 Units by mouth daily.    Marland Kitchen dexamethasone (DECADRON) 4 MG tablet Take 1 tablet (4 mg total) by mouth daily. 1 tablet daily before chemotherapy and 1 tablet day after chemotherapy 15 tablet 0  . HYDROcodone-acetaminophen (NORCO) 10-325 MG tablet Take 1 tablet by mouth every 6 (six) hours as needed. 10 tablet 0  . lidocaine-prilocaine (EMLA) cream Apply to affected area once 30 g 3  . LORazepam (ATIVAN) 0.5 MG tablet Take 1 tablet (0.5 mg total) by mouth every 6 (six) hours as needed (Nausea or vomiting). 30 tablet 0  . magnesium 30 MG tablet Take 30 mg by mouth 2 (two)  times daily.    . Omega-3 Fatty Acids (FISH OIL PO) Take by mouth.    . ondansetron (ZOFRAN) 8 MG tablet Take 1 tablet (8 mg total) by mouth 2 (two) times daily as needed for refractory nausea / vomiting. Start on day 3 after chemo. 30 tablet 1  . Probiotic Product (PROBIOTIC DAILY PO) Take by mouth.    . prochlorperazine (COMPAZINE) 10 MG tablet Take 1 tablet (10 mg total) by mouth every 6 (six) hours as needed (Nausea or vomiting). 30 tablet  1   No current facility-administered medications for this visit.     PHYSICAL EXAMINATION: ECOG PERFORMANCE STATUS: 1 - Symptomatic but completely ambulatory  Vitals:   01/07/16 0847  BP: 111/77  Pulse: 91  Resp: 18  Temp: 98.3 F (36.8 C)   Filed Weights   01/07/16 0847  Weight: 157 lb (71.2 kg)    GENERAL:alert, no distress and comfortable SKIN: skin color, texture, turgor are normal, no rashes or significant lesions EYES: normal, Conjunctiva are pink and non-injected, sclera clear OROPHARYNX:no exudate, no erythema and lips, buccal mucosa, and tongue normal  NECK: supple, thyroid normal size, non-tender, without nodularity LYMPH:  no palpable lymphadenopathy in the cervical, axillary or inguinal LUNGS: clear to auscultation and percussion with normal breathing effort HEART: regular rate & rhythm and no murmurs and no lower extremity edema ABDOMEN:abdomen soft, non-tender and normal bowel sounds MUSCULOSKELETAL:no cyanosis of digits and no clubbing  NEURO: alert & oriented x 3 with fluent speech, no focal motor/sensory deficits EXTREMITIES: No lower extremity edema  LABORATORY DATA:  I have reviewed the data as listed   Chemistry      Component Value Date/Time   NA 139 12/31/2015 0847   K 3.4 (L) 12/31/2015 0847   CL 104 02/04/2012 1648   CO2 24 12/31/2015 0847   BUN 15.8 12/31/2015 0847   CREATININE 0.8 12/31/2015 0847      Component Value Date/Time   CALCIUM 9.2 12/31/2015 0847   ALKPHOS 61 12/31/2015 0847   AST 16 12/31/2015 0847   ALT 16 12/31/2015 0847   BILITOT 0.56 12/31/2015 0847       Lab Results  Component Value Date   WBC 1.2 (L) 01/07/2016   HGB 13.0 01/07/2016   HCT 38.4 01/07/2016   MCV 85.9 01/07/2016   PLT 189 01/07/2016   NEUTROABS 0.4 (LL) 01/07/2016     ASSESSMENT & PLAN:  Breast cancer of upper-inner quadrant of right female breast ([place]) Right breast biopsy 2:00 6 cm from nipple: Grade 2-3 invasive ductal cancer, ER 90%, PR  2%, Ki-67 80%, HER-2 positive ratio 2.07; right biopsy 2:00 4 cm from nipple: IDC with DCIS, ER 95%, PR 10%, Ki-67 90%, HER-2 negative ratio 1.27 Breast MRI08/30/2017: 3 enhancing masses in the upper inner quadrant right breast spanning an area 3.7 cm (1.7 cm, 1 cm, 1.4 cm) no lymph node enlargement Clinical stage: T2N0 (stage 2A)  Treatment plan: 1. Genetic counseling 2. Neoadjuvant chemotherapy. ([place] Perjeta 6 cycles followed by Herceptin maintenance for one year) 3. Breast conserving surgery followed by 4. Adjuvant radiation therapy followed by 5. Adjuvant antiestrogen therapy -------------------------------------------------------------------------------------------------------------------------------------------------- Current treatment: Cycle 1 day 8 [place] Perjeta Echocardiogram 12/26/2015: EF 60-65% Chemotherapy toxicities: 1. Nausea grade 1 2. severe acid reflux: I will discontinue oral dexamethasone with the chemotherapy. 3. Insomnia: I will change her from Ativan to Xanax. 4. Neutropenia: Given neutropenic precautions. Her insurance did not authorize Neulasta. If her blood counts do not adequate before the next  treatment we may have to give Neulasta with subsequent treatments.    Monitoring closely for chemotherapy toxicities  Return to clinic in 2 week for cycle 2   No orders of the defined types were placed in this encounter.  The patient has a good understanding of the overall plan. she agrees with it. she will call with any problems that may develop before the next visit here.   Rulon Eisenmenger, MD 01/07/16

## 2016-01-07 NOTE — Progress Notes (Signed)
[place] Spiritual Care Note  Learned of Patricia Chandler's extreme laryngitis and accompanying frustration/isolation from nurse navigator [facility]  Also consulted with Abigail Elmore/LCSW and Esmond Camper Johnson/counseling intern regarding ways to support pt during this time.  Used email as a means for spiritual care, sending Patricia Chandler a note of encouragement, including photos from the [facility] (source of beauty, cheer, and centering tool) and a writing worksheet entitled "Hope is the thing with feathers" to invite reflection on which of her hopes may be vibrant right now and which, like a fallen bird feather, may be lost in her current circumstances.  The activity emphasizes that a bird can fly even after losing feathers, which is part of life, thus normalizing and supporting grief while refocusing on the possibilities that remain in the new present situation.  If Patricia Chandler is interested, my POC is to continue to collaborate with the Support Team and to offer similar spiritual reflection tools by email until Patricia Chandler's voice has recovered and in-person meetings might be more fruitful.  Please also page if needs arise/circumstances change.  Thank you.   [place], [place], [facility] Pager [phone] Voicemail [phone]

## 2016-01-08 ENCOUNTER — Encounter: Payer: Self-pay | Admitting: General Practice

## 2016-01-08 ENCOUNTER — Other Ambulatory Visit: Payer: Self-pay

## 2016-01-08 ENCOUNTER — Ambulatory Visit: Payer: BLUE CROSS/BLUE SHIELD

## 2016-01-08 ENCOUNTER — Telehealth: Payer: Self-pay | Admitting: *Deleted

## 2016-01-08 ENCOUNTER — Other Ambulatory Visit: Payer: Self-pay | Admitting: Nurse Practitioner

## 2016-01-08 ENCOUNTER — Other Ambulatory Visit: Payer: Self-pay | Admitting: Hematology and Oncology

## 2016-01-08 ENCOUNTER — Encounter: Payer: Self-pay | Admitting: Nurse Practitioner

## 2016-01-08 ENCOUNTER — Ambulatory Visit (HOSPITAL_BASED_OUTPATIENT_CLINIC_OR_DEPARTMENT_OTHER): Payer: BLUE CROSS/BLUE SHIELD

## 2016-01-08 ENCOUNTER — Ambulatory Visit (HOSPITAL_BASED_OUTPATIENT_CLINIC_OR_DEPARTMENT_OTHER): Payer: BLUE CROSS/BLUE SHIELD | Admitting: Nurse Practitioner

## 2016-01-08 ENCOUNTER — Inpatient Hospital Stay (HOSPITAL_COMMUNITY)
Admission: AD | Admit: 2016-01-08 | Discharge: 2016-01-12 | DRG: 871 | Disposition: A | Discharge status: Home / Self Care | Payer: BLUE CROSS/BLUE SHIELD | Source: Ambulatory Visit | Attending: Internal Medicine | Admitting: Internal Medicine

## 2016-01-08 ENCOUNTER — Ambulatory Visit (HOSPITAL_COMMUNITY)
Admission: RE | Admit: 2016-01-08 | Discharge: 2016-01-08 | Disposition: A | Discharge status: Home / Self Care | Payer: BLUE CROSS/BLUE SHIELD | Source: Ambulatory Visit | Attending: Nurse Practitioner | Admitting: Nurse Practitioner

## 2016-01-08 VITALS — BP 112/53 | HR 112 | Temp 101.4°F | Resp 18 | Ht 63.0 in | Wt 156.7 lb

## 2016-01-08 DIAGNOSIS — R5081 Fever presenting with conditions classified elsewhere: Secondary | ICD-10-CM | POA: Diagnosis not present

## 2016-01-08 DIAGNOSIS — R197 Diarrhea, unspecified: Secondary | ICD-10-CM | POA: Diagnosis present

## 2016-01-08 DIAGNOSIS — Z8249 Family history of ischemic heart disease and other diseases of the circulatory system: Secondary | ICD-10-CM

## 2016-01-08 DIAGNOSIS — T451X5A Adverse effect of antineoplastic and immunosuppressive drugs, initial encounter: Secondary | ICD-10-CM

## 2016-01-08 DIAGNOSIS — C50211 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 female breast: Secondary | ICD-10-CM

## 2016-01-08 DIAGNOSIS — Z7952 Long term (current) use of systemic steroids: Secondary | ICD-10-CM

## 2016-01-08 DIAGNOSIS — Z820 Family history of epilepsy and other diseases of the nervous system: Secondary | ICD-10-CM

## 2016-01-08 DIAGNOSIS — E860 Dehydration: Secondary | ICD-10-CM | POA: Diagnosis not present

## 2016-01-08 DIAGNOSIS — D709 Neutropenia, unspecified: Secondary | ICD-10-CM

## 2016-01-08 DIAGNOSIS — Z853 Personal history of malignant neoplasm of breast: Secondary | ICD-10-CM

## 2016-01-08 DIAGNOSIS — J029 Acute pharyngitis, unspecified: Secondary | ICD-10-CM | POA: Diagnosis present

## 2016-01-08 DIAGNOSIS — R509 Fever, unspecified: Secondary | ICD-10-CM

## 2016-01-08 DIAGNOSIS — Z95828 Presence of other vascular implants and grafts: Secondary | ICD-10-CM

## 2016-01-08 DIAGNOSIS — Z803 Family history of malignant neoplasm of breast: Secondary | ICD-10-CM

## 2016-01-08 DIAGNOSIS — E43 Unspecified severe protein-calorie malnutrition: Secondary | ICD-10-CM | POA: Diagnosis present

## 2016-01-08 DIAGNOSIS — Z79899 Other long term (current) drug therapy: Secondary | ICD-10-CM

## 2016-01-08 DIAGNOSIS — Z823 Family history of stroke: Secondary | ICD-10-CM

## 2016-01-08 DIAGNOSIS — D61810 Antineoplastic chemotherapy induced pancytopenia: Secondary | ICD-10-CM | POA: Diagnosis present

## 2016-01-08 DIAGNOSIS — E876 Hypokalemia: Secondary | ICD-10-CM | POA: Diagnosis present

## 2016-01-08 DIAGNOSIS — Z8619 Personal history of other infectious and parasitic diseases: Secondary | ICD-10-CM | POA: Diagnosis present

## 2016-01-08 DIAGNOSIS — D701 Agranulocytosis secondary to cancer chemotherapy: Secondary | ICD-10-CM

## 2016-01-08 DIAGNOSIS — F419 Anxiety disorder, unspecified: Secondary | ICD-10-CM | POA: Diagnosis present

## 2016-01-08 DIAGNOSIS — K5790 Diverticulosis of intestine, part unspecified, without perforation or abscess without bleeding: Secondary | ICD-10-CM | POA: Diagnosis present

## 2016-01-08 DIAGNOSIS — K219 Gastro-esophageal reflux disease without esophagitis: Secondary | ICD-10-CM | POA: Diagnosis present

## 2016-01-08 DIAGNOSIS — K5900 Constipation, unspecified: Secondary | ICD-10-CM | POA: Diagnosis present

## 2016-01-08 DIAGNOSIS — R059 Cough, unspecified: Secondary | ICD-10-CM

## 2016-01-08 DIAGNOSIS — K1230 Oral mucositis (ulcerative), unspecified: Secondary | ICD-10-CM | POA: Diagnosis present

## 2016-01-08 DIAGNOSIS — A419 Sepsis, unspecified organism: Principal | ICD-10-CM | POA: Diagnosis present

## 2016-01-08 DIAGNOSIS — R05 Cough: Secondary | ICD-10-CM

## 2016-01-08 HISTORY — DX: Presence of other vascular implants and grafts: Z95.828

## 2016-01-08 LAB — PROTIME-INR
INR: 1.06
Prothrombin Time: 13.8 seconds (ref 11.4–15.2)

## 2016-01-08 LAB — APTT: APTT: 28 s (ref 24–36)

## 2016-01-08 LAB — CBC WITH DIFFERENTIAL/PLATELET
BASO%: 3.1 % — ABNORMAL HIGH (ref 0.0–2.0)
BASOS ABS: 0 10*3/uL (ref 0.0–0.1)
EOS ABS: 0 10*3/uL (ref 0.0–0.5)
EOS%: 0 % (ref 0.0–7.0)
HEMATOCRIT: 36.2 % (ref 34.8–46.6)
HEMOGLOBIN: 12.6 g/dL (ref 11.6–15.9)
LYMPH#: 0.4 10*3/uL — AB (ref 0.9–3.3)
LYMPH%: 60 % — ABNORMAL HIGH (ref 14.0–49.7)
MCH: 29.2 pg (ref 25.1–34.0)
MCHC: 34.8 g/dL (ref 31.5–36.0)
MCV: 83.8 fL (ref 79.5–101.0)
MONO#: 0.2 10*3/uL (ref 0.1–0.9)
MONO%: 32.3 % — ABNORMAL HIGH (ref 0.0–14.0)
NEUT#: 0 10*3/uL — CL (ref 1.5–6.5)
NEUT%: 4.6 % — AB (ref 38.4–76.8)
NRBC: 0 % (ref 0–0)
PLATELETS: 154 10*3/uL (ref 145–400)
RBC: 4.32 10*6/uL (ref 3.70–5.45)
RDW: 12.7 % (ref 11.2–14.5)
WBC: 0.7 10*3/uL — CL (ref 3.9–10.3)

## 2016-01-08 LAB — COMPREHENSIVE METABOLIC PANEL
ALBUMIN: 3.8 g/dL (ref 3.5–5.0)
ALK PHOS: 79 U/L (ref 40–150)
ALT: 58 U/L — ABNORMAL HIGH (ref 0–55)
ANION GAP: 11 meq/L (ref 3–11)
AST: 29 U/L (ref 5–34)
BILIRUBIN TOTAL: 0.81 mg/dL (ref 0.20–1.20)
BUN: 9.5 mg/dL (ref 7.0–26.0)
CALCIUM: 9.2 mg/dL (ref 8.4–10.4)
CO2: 25 mEq/L (ref 22–29)
Chloride: 99 mEq/L (ref 98–109)
Creatinine: 0.7 mg/dL (ref 0.6–1.1)
EGFR: 88 mL/min/{1.73_m2} — AB (ref >90)
GLUCOSE: 98 mg/dL (ref 70–140)
POTASSIUM: 3.7 meq/L (ref 3.5–5.1)
Sodium: 134 mEq/L — ABNORMAL LOW (ref 136–145)
TOTAL PROTEIN: 7.1 g/dL (ref 6.4–8.3)

## 2016-01-08 LAB — URINALYSIS, MICROSCOPIC - CHCC
BILIRUBIN (URINE): NEGATIVE
BLOOD: NEGATIVE
GLUCOSE UR CHCC: NEGATIVE mg/dL
Ketones: 15 mg/dL
LEUKOCYTE ESTERASE: NEGATIVE
NITRITE: NEGATIVE
Protein: NEGATIVE mg/dL
Specific Gravity, Urine: 1.01 (ref 1.003–1.035)
Urobilinogen, UR: 0.2 mg/dL (ref 0.2–1)
pH: 6.5 (ref 4.6–8.0)

## 2016-01-08 LAB — PROCALCITONIN: Procalcitonin: <0.1 ng/mL

## 2016-01-08 LAB — LACTIC ACID, PLASMA
Lactic Acid, Venous: 2 mmol/L (ref 0.5–1.9)
Lactic Acid, Venous: 1 mmol/L (ref 0.5–1.9)

## 2016-01-08 MED ORDER — HEPARIN SOD (PORK) LOCK FLUSH 100 UNIT/ML IV SOLN
500.0000 [IU] | Freq: Once | INTRAVENOUS | Status: AC | PRN
  Administered 2016-01-08: 500 [IU] via INTRAVENOUS
  Filled 2016-01-08: qty 5

## 2016-01-08 MED ORDER — DEXTROSE 5 % IV SOLN
2.0000 g | Freq: Three times a day (TID) | INTRAVENOUS | Status: DC
  Administered 2016-01-09 – 2016-01-12 (×10): 2 g via INTRAVENOUS
  Filled 2016-01-08 (×12): qty 2

## 2016-01-08 MED ORDER — SODIUM CHLORIDE 0.9% FLUSH
10.0000 mL | Freq: Once | INTRAVENOUS | Status: AC
  Administered 2016-01-08: 10 mL via INTRAVENOUS
  Filled 2016-01-08: qty 10

## 2016-01-08 MED ORDER — VITAMIN D3 25 MCG (1000 UNIT) PO TABS
4000.0000 [IU] | ORAL_TABLET | Freq: Every day | ORAL | Status: DC
  Administered 2016-01-09 – 2016-01-12 (×4): 4000 [IU] via ORAL
  Filled 2016-01-08 (×4): qty 4

## 2016-01-08 MED ORDER — LEVOFLOXACIN 500 MG PO TABS: 500.0000 mg | ORAL_TABLET | Freq: Every day | ORAL | 0 refills | Status: DC

## 2016-01-08 MED ORDER — LIDOCAINE-PRILOCAINE 2.5-2.5 % EX CREA
TOPICAL_CREAM | Freq: Every day | CUTANEOUS | Status: DC | PRN
  Filled 2016-01-08: qty 5

## 2016-01-08 MED ORDER — CALCIUM CARBONATE 1250 (500 CA) MG PO TABS
1.0000 | ORAL_TABLET | Freq: Every day | ORAL | Status: DC
  Administered 2016-01-09: 500 mg via ORAL
  Filled 2016-01-08 (×2): qty 1

## 2016-01-08 MED ORDER — FILGRASTIM 480 MCG/0.8ML IJ SOSY: 480.0000 ug | PREFILLED_SYRINGE | Freq: Once | INTRAMUSCULAR | Status: DC

## 2016-01-08 MED ORDER — CEFEPIME HCL 2 G IJ SOLR
2.0000 g | INTRAMUSCULAR | Status: AC
  Administered 2016-01-08: 2 g via INTRAVENOUS
  Filled 2016-01-08: qty 2

## 2016-01-08 MED ORDER — SODIUM CHLORIDE 0.9 % IJ SOLN
10.0000 mL | INTRAMUSCULAR | Status: DC | PRN
  Administered 2016-01-08: 10 mL via INTRAVENOUS
  Filled 2016-01-08: qty 10

## 2016-01-08 MED ORDER — HEPARIN SOD (PORK) LOCK FLUSH 100 UNIT/ML IV SOLN
500.0000 [IU] | Freq: Once | INTRAVENOUS | Status: AC
  Administered 2016-01-08: 500 [IU] via INTRAVENOUS
  Filled 2016-01-08: qty 5

## 2016-01-08 MED ORDER — IBUPROFEN 200 MG PO TABS
400.0000 mg | ORAL_TABLET | Freq: Four times a day (QID) | ORAL | Status: DC | PRN
  Administered 2016-01-08 – 2016-01-10 (×5): 400 mg via ORAL
  Administered 2016-01-10 (×2): 600 mg via ORAL
  Administered 2016-01-11 – 2016-01-12 (×2): 400 mg via ORAL
  Filled 2016-01-08: qty 3
  Filled 2016-01-08 (×6): qty 2
  Filled 2016-01-08 (×2): qty 3

## 2016-01-08 MED ORDER — VANCOMYCIN HCL 10 G IV SOLR
1500.0000 mg | INTRAVENOUS | Status: AC
  Administered 2016-01-08: 1500 mg via INTRAVENOUS
  Filled 2016-01-08: qty 1500

## 2016-01-08 MED ORDER — ALPRAZOLAM 0.5 MG PO TABS
0.5000 mg | ORAL_TABLET | Freq: Every evening | ORAL | Status: DC | PRN
  Administered 2016-01-08 – 2016-01-11 (×4): 0.5 mg via ORAL
  Filled 2016-01-08 (×4): qty 1

## 2016-01-08 MED ORDER — ACETAMINOPHEN 325 MG PO TABS
650.0000 mg | ORAL_TABLET | Freq: Once | ORAL | Status: AC
  Administered 2016-01-08: 650 mg via ORAL

## 2016-01-08 MED ORDER — PANTOPRAZOLE SODIUM 40 MG PO TBEC
40.0000 mg | DELAYED_RELEASE_TABLET | Freq: Two times a day (BID) | ORAL | Status: DC
  Administered 2016-01-08 – 2016-01-12 (×8): 40 mg via ORAL
  Filled 2016-01-08 (×8): qty 1

## 2016-01-08 MED ORDER — ONDANSETRON HCL 4 MG PO TABS
4.0000 mg | ORAL_TABLET | Freq: Four times a day (QID) | ORAL | Status: DC | PRN
  Administered 2016-01-11: 4 mg via ORAL
  Filled 2016-01-08 (×2): qty 1

## 2016-01-08 MED ORDER — ONDANSETRON HCL 4 MG/2ML IJ SOLN
4.0000 mg | Freq: Four times a day (QID) | INTRAMUSCULAR | Status: DC | PRN
  Administered 2016-01-11: 4 mg via INTRAVENOUS
  Filled 2016-01-08: qty 2

## 2016-01-08 MED ORDER — VITAMIN D3 25 MCG (1000 UT) PO CAPS: 4000.0000 [IU] | ORAL_CAPSULE | Freq: Every day | ORAL | Status: DC

## 2016-01-08 MED ORDER — SODIUM CHLORIDE 0.9 % IV BOLUS (SEPSIS)
2500.0000 mL | Freq: Once | INTRAVENOUS | Status: AC
  Administered 2016-01-08: 2500 mL via INTRAVENOUS

## 2016-01-08 MED ORDER — ACETAMINOPHEN 325 MG PO TABS
650.0000 mg | ORAL_TABLET | Freq: Four times a day (QID) | ORAL | Status: DC | PRN
  Administered 2016-01-08: 650 mg via ORAL
  Filled 2016-01-08: qty 2

## 2016-01-08 MED ORDER — ACETAMINOPHEN 650 MG RE SUPP: 650.0000 mg | Freq: Four times a day (QID) | RECTAL | Status: DC | PRN

## 2016-01-08 MED ORDER — LOPERAMIDE HCL 2 MG PO CAPS
2.0000 mg | ORAL_CAPSULE | ORAL | Status: DC | PRN
  Administered 2016-01-09: 2 mg via ORAL
  Filled 2016-01-08: qty 1

## 2016-01-08 MED ORDER — RISAQUAD PO CAPS
1.0000 | ORAL_CAPSULE | Freq: Every day | ORAL | Status: DC
  Administered 2016-01-09 – 2016-01-12 (×4): 1 via ORAL
  Filled 2016-01-08 (×4): qty 1

## 2016-01-08 MED ORDER — PROBIOTIC DAILY PO CAPS: 1.0000 | ORAL_CAPSULE | Freq: Every day | ORAL | Status: DC

## 2016-01-08 MED ORDER — DM-GUAIFENESIN ER 30-600 MG PO TB12
1.0000 | ORAL_TABLET | Freq: Two times a day (BID) | ORAL | Status: DC | PRN
Start: 2016-01-08 — End: 2016-01-12

## 2016-01-08 MED ORDER — SODIUM CHLORIDE 0.9 % IV SOLN
Freq: Once | INTRAVENOUS | Status: AC
  Administered 2016-01-08: 15:00:00 via INTRAVENOUS

## 2016-01-08 MED ORDER — TBO-FILGRASTIM 480 MCG/0.8ML ~~LOC~~ SOSY
480.0000 ug | PREFILLED_SYRINGE | Freq: Once | SUBCUTANEOUS | Status: AC
  Administered 2016-01-08: 480 ug via SUBCUTANEOUS
  Filled 2016-01-08: qty 0.8

## 2016-01-08 MED ORDER — ENOXAPARIN SODIUM 40 MG/0.4ML ~~LOC~~ SOLN
40.0000 mg | SUBCUTANEOUS | Status: DC
  Administered 2016-01-08 – 2016-01-11 (×4): 40 mg via SUBCUTANEOUS
  Filled 2016-01-08 (×4): qty 0.4

## 2016-01-08 MED ORDER — OMEGA-3-ACID ETHYL ESTERS 1 G PO CAPS
1.0000 g | ORAL_CAPSULE | Freq: Every day | ORAL | Status: DC
  Administered 2016-01-08 – 2016-01-12 (×5): 1 g via ORAL
  Filled 2016-01-08 (×5): qty 1

## 2016-01-08 MED ORDER — CALCIUM CARBONATE 600 MG PO TABS: 600.0000 mg | ORAL_TABLET | Freq: Every day | ORAL | Status: DC

## 2016-01-08 MED ORDER — MAGNESIUM 30 MG PO TABS: 30.0000 mg | ORAL_TABLET | Freq: Every day | ORAL | Status: DC

## 2016-01-08 MED ORDER — SODIUM CHLORIDE 0.9 % IV SOLN
INTRAVENOUS | Status: DC
  Administered 2016-01-08 – 2016-01-12 (×7): via INTRAVENOUS

## 2016-01-08 MED ORDER — MAGNESIUM OXIDE 400 (241.3 MG) MG PO TABS
200.0000 mg | ORAL_TABLET | Freq: Every day | ORAL | Status: DC
  Administered 2016-01-09 – 2016-01-12 (×4): 200 mg via ORAL
  Filled 2016-01-08 (×4): qty 1

## 2016-01-08 MED ORDER — HYDROCODONE-ACETAMINOPHEN 10-325 MG PO TABS: 1.0000 | ORAL_TABLET | Freq: Four times a day (QID) | ORAL | Status: DC | PRN

## 2016-01-08 MED ORDER — SODIUM CHLORIDE 0.9% FLUSH
3.0000 mL | Freq: Two times a day (BID) | INTRAVENOUS | Status: DC
  Administered 2016-01-09 (×2): 3 mL via INTRAVENOUS

## 2016-01-08 MED ORDER — ACETAMINOPHEN 325 MG PO TABS
ORAL_TABLET | ORAL | Status: AC
  Filled 2016-01-08: qty 2

## 2016-01-08 MED ORDER — VANCOMYCIN HCL IN DEXTROSE 750-5 MG/150ML-% IV SOLN
750.0000 mg | Freq: Two times a day (BID) | INTRAVENOUS | Status: DC
  Filled 2016-01-08: qty 150

## 2016-01-08 MED FILL — levoFLOXacin 500 MG TABS: 500 | 10 days supply | Qty: 10 | Fill #0

## 2016-01-08 NOTE — Telephone Encounter (Signed)
Received call from pt's husband stating that pt does not feel well today & temp is 100.5.  She is drinking plenty of fluids & he picked up some Pedialyte for her.  She is also having some diarrhea & she has taken something for this.  Denies any other symptoms.  He mentioned that her voice is gone post port placement but states that Dr Lindi Adie knows about this.   Message routed to Dr Rolla Plate RN & notified Eulas Post by phone.

## 2016-01-08 NOTE — Progress Notes (Signed)
Patient has neutropenic fever We are going to treat her with an outpatient regimen of daily Neupogen injections Blood cultures urine cultures chest x-ray been obtained Start antibiotic with levofloxacin 750 mg daily. She will need Neulasta injection with the next cycle of chemotherapy. We'll inform [place] to get prior authorization.

## 2016-01-08 NOTE — Assessment & Plan Note (Signed)
Patient received her first cycle of carboplatin/Taxotere/Herceptin/Perjeta chemotherapy on 12/31/2015.  She was unable to receive a Neulasta injection for growth for support due to insurance denial.  Patient presented to the [facility] today with complaint of less than a 24-hour history of temperature to maximum 100.4.  She states that she felt extremely tired; but had no other complaints.  She denied any URI symptoms or UTI.  Of note-patient states that she remains hoarse after intubation 2 for Port-A-Cath insertion.  On exam today.  Patient appears tired and very weak.  Patient's lung sounds were clear bilaterally with no cough or wheeze.  Labs obtained today revealed WBC 0.7, ANC 0.0, hemoglobin 12.6, and platelet count 154.  Also, obtained urine and urine culture; as well as blood cultures 2.  Urinalysis was essentially normal; but pending urine culture results.  Chest x-ray was obtained as well and was  normal.  Patient received 1 L IV fluid rehydration while at the [facility] today; as well as Tylenol.  Patient also receives Neupogen 480 g subcutaneously today.  The plan was for the patient did receive Neupogen 480 g for a total of 4 days.  Also, Dr. Lindi Adie advised that he would plan on giving patient Neulasta with the next cycle of chemotherapy.  She  was preparing to be discharged from the [facility]; and was noted to have a temperature that had increased to 101.4 despite Tylenol recently.  Reviewed all findings with Dr. Lindi Adie; and he recommended that patient be direct admitted per the hospitalist.  Patient will be direct admit per Dr. Doyle Askew hospitalist.  Brief history and report were given to Dr. Bjorn Loser; and this provider also, called report to the floor nurse as well.  CODE STATUS: Patient should be considered a full code; since there are no advanced directives in the patient's chart.

## 2016-01-08 NOTE — H&P (Addendum)
History and Physical    Patricia Chandler [id number] DOB: May 07, 1955 DOA: 01/08/2016  Referring MD/NP/PA:   PCP: Osborne Casco, MD   Patient coming from:  The patient is coming from home.  At baseline, pt is independent for most of ADL.      Chief Complaint: Generalized weakness, fever  HPI: Patricia Chandler is a 60 y.o. female with medical history significant of recently diagnosed right breast cancer ( received first dose chemotherapy on 12/31/15), diverticulosis, GERD, anxiety, who presents with generalized weakness and fever.  Patient states that she received her first dose of chemotherapy on 12/31/15 for breast cancer. She developed generalized weakness. She was seen by her oncologist, Dr. Lindi Adie yesterday, and was told that her weakness is likely related to chemotherapy. Today she developed fever of 100.5. She was started with Levaquin by Dr. Lindi Adie today. She had negative urinalysis and a chest x-ray. Pt states that after she had intubation for Port-A line placement, she developed sore throat and hoarseness. She was seen by ENT. She was noted to have acid reflux and was prescribed Protonix. She has mild cough with while mucus production. No chest pain or SOB. Patient was constipated, and started with MiraLAX, now has diarrhea. She has 1 or 2 bowel movements with loose stool each day. No nausea, vomiting or abdominal pain pain. Denies symptoms of UTI or unilateral weakness.  ED Course: pt was admitted directly from cancer center.  She was found to have WBC 6.7, temperature 101.4, tachycardia, no tachypnea, oxygen saturation 99% on room air, electrolytes and renal function okay. Soft blood pressure 99/62. Patient is placed on tele bed for observation.  Review of Systems:   General: has fevers, chills, no changes in body weight, has fatigue, diarrhea HEENT: no blurry vision, hearing changes. Has sore throat Respiratory: no dyspnea, has coughing, no wheezing CV: no chest  pain, no palpitations GI: no nausea, vomiting, abdominal pain, has diarrhea, no constipation GU: no dysuria, burning on urination, increased urinary frequency, hematuria  Ext: no leg edema Neuro: no unilateral weakness, numbness, or tingling, no vision change or hearing loss Skin: no rash, no skin tear. MSK: No muscle spasm, no deformity, no limitation of range of movement in spin Heme: No easy bruising.  Travel history: No recent long distant travel.  Allergy: No Known Allergies  Past Medical History:  Diagnosis Date  . Breast cancer ([place])   . Diverticulosis   . Family history of breast cancer   . Genital herpes   . Heart murmur    had ECHO 12 yrs ago was neg  . Internal hemorrhoid     Past Surgical History:  Procedure Laterality Date  . BREAST ENHANCEMENT SURGERY    . BREAST LUMPECTOMY     right/BENIGN  . PORTACATH PLACEMENT N/A 12/30/2015   Procedure: INSERTION PORT-A-CATH WITH [place];  Surgeon: Rolm Bookbinder, MD;  Location: [facility];  Service: General;  Laterality: N/A;    Social History:  reports that she has never smoked. She has never used smokeless tobacco. She reports that she drinks about 1.8 oz of alcohol per week . She reports that she does not use drugs.  Family History:  Family History  Problem Relation Age of Onset  . Breast cancer Maternal Aunt     dx in her 19s  . Stroke Father   . Hypertension Father   . Pulmonary fibrosis Mother   . Breast cancer Paternal Aunt 78  . Cancer Paternal Grandmother  abdominal; cervical vs uterine dx in her 33s  . Parkinson's disease Maternal Grandfather      Prior to Admission medications   Medication Sig Start Date End Date Taking? Authorizing Provider  ALPRAZolam Duanne Moron) 0.5 MG tablet Take 1 tablet (0.5 mg total) by mouth at bedtime as needed for anxiety. 01/07/16  Yes Nicholas Lose, MD  Ascorbic Acid (VITAMIN C PO) Take 1 tablet by mouth daily.    Yes Historical Provider, MD  calcium carbonate  (OS-CAL) 600 MG TABS tablet Take 600 mg by mouth daily.   Yes Historical Provider, MD  Cholecalciferol (VITAMIN D3) 1000 units CAPS Take 4,000 Units by mouth daily.   Yes Historical Provider, MD  dexamethasone (DECADRON) 4 MG tablet Take 1 tablet (4 mg total) by mouth daily. 1 tablet daily before chemotherapy and 1 tablet day after chemotherapy 12/20/15  Yes Nicholas Lose, MD  HYDROcodone-acetaminophen (NORCO) 10-325 MG tablet Take 1 tablet by mouth every 6 (six) hours as needed. 12/30/15  Yes Rolm Bookbinder, MD  ibuprofen (ADVIL,MOTRIN) 200 MG tablet Take 400-600 mg by mouth every 6 (six) hours as needed for moderate pain.   Yes Historical Provider, MD  lidocaine-prilocaine (EMLA) cream Apply to affected area once 12/20/15  Yes Nicholas Lose, MD  loperamide (IMODIUM) 2 MG capsule Take 2 mg by mouth as needed for diarrhea or loose stools.   Yes Historical Provider, MD  magnesium 30 MG tablet Take 30 mg by mouth daily.    Yes Historical Provider, MD  Omega-3 Fatty Acids (FISH OIL PO) Take 1 capsule by mouth daily.    Yes Historical Provider, MD  ondansetron (ZOFRAN) 8 MG tablet Take 1 tablet (8 mg total) by mouth 2 (two) times daily as needed for refractory nausea / vomiting. Start on day 3 after chemo. 12/20/15  Yes Nicholas Lose, MD  pantoprazole (PROTONIX) 40 MG tablet Take 40 mg by mouth 2 (two) times daily.  01/06/16  Yes Historical Provider, MD  polyethylene glycol (MIRALAX / GLYCOLAX) packet Take 8.5 g by mouth daily as needed for mild constipation.   Yes Historical Provider, MD  Probiotic Product (PROBIOTIC DAILY PO) Take 1 capsule by mouth daily.    Yes Historical Provider, MD  prochlorperazine (COMPAZINE) 10 MG tablet Take 1 tablet (10 mg total) by mouth every 6 (six) hours as needed (Nausea or vomiting). 12/20/15  Yes Nicholas Lose, MD  levofloxacin (LEVAQUIN) 500 MG tablet Take 1 tablet (500 mg total) by mouth daily. 01/08/16   Susanne Borders, NP    Physical Exam: Vitals:   01/08/16 1835  BP:  99/62  Pulse: (!) 124  Resp: 19  Temp: 99.3 F (37.4 C)  TempSrc: Oral  SpO2: 99%  Weight: 72.3 kg (159 lb 6.4 oz)  Height: 5\' 3"  (1.6 m)   General: Not in acute distress HEENT:       Eyes: PERRL, EOMI, no scleral icterus.       ENT: No discharge from the ears and nose, no tonsillar enlargement.        Neck: No JVD, no bruit, no mass felt. Heme: No neck lymph node enlargement. Cardiac: [id number], RRR, 1/6 systolic murmurs (not new), No gallops or rubs. Has port-A line over right upper chest with clearing surrounding. Respiratory:  No rales, wheezing, rhonchi or rubs. GI: Soft, nondistended, nontender, no rebound pain, no organomegaly, BS present. GU: No hematuria Ext: No pitting leg edema bilaterally. 2+DP/PT pulse bilaterally. Musculoskeletal: No joint deformities, No joint redness or warmth, no limitation of  ROM in spin. Skin: No rashes.  Neuro: Alert, oriented X3, cranial nerves II-XII grossly intact, moves all extremities normally.  Psych: Patient is not psychotic, no suicidal or hemocidal ideation.  Labs on Admission: I have personally reviewed following labs and imaging studies  CBC:  Recent Labs Lab 01/07/16 0830 01/08/16 1405  WBC 1.2* 0.7*  NEUTROABS 0.4* 0.0*  HGB 13.0 12.6  HCT 38.4 36.2  MCV 85.9 83.8  PLT 189 [id number]   Basic Metabolic Panel:  Recent Labs Lab 01/07/16 0830 01/08/16 1405  NA 136 134*  K 4.0 3.7  CO2 25 25  GLUCOSE 94 98  BUN 14.2 9.5  CREATININE 0.8 0.7  CALCIUM 9.5 9.2   GFR: Estimated Creatinine Clearance: 69.5 mL/min (by C-G formula based on SCr of 0.7 mg/dL). Liver Function Tests:  Recent Labs Lab 01/07/16 0830 01/08/16 1405  AST 35* 29  ALT 52 58*  ALKPHOS 66 79  BILITOT 0.76 0.81  PROT 6.7 7.1  ALBUMIN 3.7 3.8   No results for input(s): LIPASE, AMYLASE in the last 168 hours. No results for input(s): AMMONIA in the last 168 hours. Coagulation Profile: No results for input(s): INR, PROTIME in the last 168 hours. Cardiac  Enzymes: No results for input(s): CKTOTAL, CKMB, CKMBINDEX, TROPONINI in the last 168 hours. BNP (last 3 results) No results for input(s): PROBNP in the last 8760 hours. HbA1C: No results for input(s): HGBA1C in the last 72 hours. CBG: No results for input(s): GLUCAP in the last 168 hours. Lipid Profile: No results for input(s): CHOL, HDL, LDLCALC, TRIG, CHOLHDL, LDLDIRECT in the last 72 hours. Thyroid Function Tests: No results for input(s): TSH, T4TOTAL, FREET4, T3FREE, THYROIDAB in the last 72 hours. Anemia Panel: No results for input(s): VITAMINB12, FOLATE, FERRITIN, TIBC, IRON, RETICCTPCT in the last 72 hours. Urine analysis:    Component Value Date/Time   LABSPEC 1.010 01/08/2016 1401   PHURINE 6.5 01/08/2016 1401   GLUCOSEU Negative 01/08/2016 1401   HGBUR Negative 01/08/2016 1401   BILIRUBINUR Negative 01/08/2016 1401   KETONESUR 15 01/08/2016 1401   PROTEINUR Negative 01/08/2016 1401   UROBILINOGEN 0.2 01/08/2016 1401   NITRITE Negative 01/08/2016 1401   LEUKOCYTESUR Negative 01/08/2016 1401   Sepsis Labs: @LABRCNTIP (procalcitonin:4,lacticidven:4) )No results found for this or any previous visit (from the past 240 hour(s)).   Radiological Exams on Admission: Dg Chest 2 View  Result Date: 01/08/2016 CLINICAL DATA:  Breast cancer.  Fever EXAM: CHEST  2 VIEW COMPARISON:  12/30/2015 FINDINGS: Lungs remain clear without evidence of pneumonia. Negative for infiltrate effusion or mass. Port-A-Cath tip remains in the SVC. IMPRESSION: No active cardiopulmonary disease. Electronically Signed   By: Franchot Gallo M.D.   On: 01/08/2016 13:43     EKG: Not done yet, will get one.   Assessment/Plan Principal Problem:   Neutropenic fever (HCC) Active Problems:   Breast cancer of upper-inner quadrant of right female breast (HCC)   Sepsis ([place])   Diarrhea   GERD (gastroesophageal reflux disease)   Neutropenic fever and sepsis: Source of infection is not clear. Urinalysis and  chest x-ray are negative. Patient has sore throat, which is likely related to recent intubation. Patient is sepsis with neutropenia, fever, tachycardia. Currently blood pressure soft, but hemodynamically stable.  - will admit to tele bed for obs - started vancomycin and cefepime - blood culture x2 and urine culutre - check Rapid strep and respiratory virus panel - will get Procalcitonin and trend lactic acid level per sepsis protocol  -IVF:  2.5 L of NS bolus, followed by 75 cc/h  Breast cancer of upper-inner quadrant of right female breast [facility]): followed by Dr. Lindi Adie. Had first chemo on 12/31/15. Now has neutropenia fever. -f/u with Dr. Lindi Adie.  Diarrhea: likely due to MiraLAX use -Hold the MiraLAX now. If continue to have diarrhea, will check C. difficile PCR  GERD: -Protonix  DVT ppx: SQ Lovenox Code Status: Full code Family Communication: None at bed side. Disposition Plan:  Anticipate discharge back to previous home environment Consults called:  none Admission status: Obs / tele       Date of Service 01/08/2016    Ivor Costa [facility] Pager [phone]  If 7PM-7AM, please contact night-coverage [email or web address] Password [facility] 01/08/2016, 8:49 PM

## 2016-01-08 NOTE — Progress Notes (Signed)
[facility] Spiritual Care Note  Patti's husband Al came to [place] to request chaplain visit for pt while she was in [facility].  Provided pastoral presence, normalization of feelings, and encouragement. Patti specifically wanted to explore ways of praying during such a hard time.  Suggested reflection tools such as the Examen (reviewing the day's consolations and desolations) and "Brags, Gratitudes, and Desires" as means for deepening her conversation with God.  Encouraged communication tools such as [place] to reduce stress on pt while keeping family updated.  Prayed with Precious Bard and Al per request.  They were both very grateful.  Precious Bard noticed that she stopped shaking with anxiety during our visit.  Both noted that they could cry and feel some relief.  Following for support, but please also page as needed.  Thank you.   [place], [place], [facility] Pager [phone] Voicemail [phone]

## 2016-01-08 NOTE — Progress Notes (Signed)
Pharmacy Antibiotic Note  Patricia Chandler is a 60 y.o. female with breast cancer recently started on chemotherapy admitted on 01/08/2016 with febrile neutropenia.  Pharmacy has been consulted for Vancomycin and Cefepime dosing.   CrCl ~69 ml/min (N82 rounded SCr 0.8), ANC 0.0, Tm 101.4  Plan: Cefepime 2g IV q8h Vancomycin 1500mg  IV x1, then 750mg  IV q12h F/u renal function, VT at Css as warranted, cultures, clinical course  Height: 5\' 3"  (160 cm) Weight: 159 lb 6.4 oz (72.3 kg) IBW/kg (Calculated) : 52.4  Temp (24hrs), Avg:100.4 F (38 C), Min:99.3 F (37.4 C), Max:101.4 F (38.6 C)   Recent Labs Lab 01/07/16 0830 01/07/16 0830 01/08/16 1405 01/08/16 1405  WBC 1.2*  --  0.7*  --   CREATININE  --  0.8  --  0.7    Estimated Creatinine Clearance: 69.5 mL/min (by C-G formula based on SCr of 0.7 mg/dL).    No Known Allergies  Antimicrobials this admission: 9/20 Vancomycin >>  9/20 Cefepime >>   Dose adjustments this admission:   Microbiology results: 9/20 BCx: ordered 9/20 UCx: ordered  9/20 Rapid strep screen: ordered 9/20 Respiratory PCR panel: ordered  Thank you for allowing pharmacy to be a part of this patient's care.  Ralene Bathe, PharmD, BCPS 01/08/2016, 8:19 PM  Pager: [phone]

## 2016-01-08 NOTE — Progress Notes (Signed)
SYMPTOM MANAGEMENT CLINIC    Chief Complaint: Neutropenic fever  HPI:  Patricia Chandler 60 y.o. female diagnosed with breast cancer.  Currently undergoing carboplatin/Taxotere/Herceptin/Perjeta chemotherapy regimen.     Breast cancer of upper-inner quadrant of right female breast ([place])   12/05/2015 Mammogram    Right breast mass 2:00 4 cm from nipple: 1.7 cm, T1c N0 stage IA; right breast 2:00 6 cm from nipple 1.3 cm mass, 7 mm satellite nodule between the 2 masses, T1 cN0 stage IA      12/06/2015 Initial Diagnosis    Right breast biopsy 2:00 6 cm from nipple: Grade 2-3 invasive ductal cancer, ER 90%, PR 2%, Ki-67 80%, HER-2 positive ratio 2.07; right biopsy 2:00 4 cm from nipple: IDC with DCIS, ER 95%, PR 10%, Ki-67 90%, HER-2 negative ratio 1.27      12/18/2015 Breast MRI    3 enhancing masses in the upper inner quadrant right breast spanning an area 3.7 cm (1.7 cm, 1 cm, 1.4 cm) no lymph node enlargement      12/31/2015 -  Neo-Adjuvant Chemotherapy    TCH Perjeta 6 cycles followed by Herceptin maintenance for 1 year       Review of Systems  Constitutional: Positive for chills, fever and malaise/fatigue.  Neurological: Positive for weakness.  All other systems reviewed and are negative.   Past Medical History:  Diagnosis Date  . Breast cancer ([place])   . Diverticulosis   . Family history of breast cancer   . Genital herpes   . Heart murmur    had ECHO 12 yrs ago was neg  . Internal hemorrhoid     Past Surgical History:  Procedure Laterality Date  . BREAST ENHANCEMENT SURGERY    . BREAST LUMPECTOMY     right/BENIGN  . PORTACATH PLACEMENT N/A 12/30/2015   Procedure: INSERTION PORT-A-CATH WITH [place];  Surgeon: Rolm Bookbinder, MD;  Location: [facility];  Service: General;  Laterality: N/A;    has Rectal bleeding; Internal hemorrhoid; Loose stools; Family history of breast cancer; Breast cancer of upper-inner quadrant of right female breast ([place]);  Port catheter in place; Neutropenic fever ([place]); and Dehydration on her problem list.    has No Known Allergies.    Medication List       Accurate as of 01/08/16  6:33 PM. Always use your most recent med list.          ALPRAZolam 0.5 MG tablet Commonly known as:  XANAX Take 1 tablet (0.5 mg total) by mouth at bedtime as needed for anxiety.   calcium carbonate 600 MG Tabs tablet Commonly known as:  OS-CAL Take 600 mg by mouth daily.   dexamethasone 4 MG tablet Commonly known as:  DECADRON Take 1 tablet (4 mg total) by mouth daily. 1 tablet daily before chemotherapy and 1 tablet day after chemotherapy   FISH OIL PO Take by mouth.   HYDROcodone-acetaminophen 10-325 MG tablet Commonly known as:  NORCO Take 1 tablet by mouth every 6 (six) hours as needed.   levofloxacin 500 MG tablet Commonly known as:  LEVAQUIN Take 1 tablet (500 mg total) by mouth daily.   lidocaine-prilocaine cream Commonly known as:  EMLA Apply to affected area once   magnesium 30 MG tablet Take 30 mg by mouth 2 (two) times daily.   ondansetron 8 MG tablet Commonly known as:  ZOFRAN Take 1 tablet (8 mg total) by mouth 2 (two) times daily as needed for refractory nausea / vomiting. Start  on day 3 after chemo.   PROBIOTIC DAILY PO Take by mouth.   prochlorperazine 10 MG tablet Commonly known as:  COMPAZINE Take 1 tablet (10 mg total) by mouth every 6 (six) hours as needed (Nausea or vomiting).   VITAMIN C PO Take by mouth.   Vitamin D3 1000 units Caps Take 4,000 Units by mouth daily.        PHYSICAL EXAMINATION  Oncology Vitals 01/08/2016 01/08/2016  Height 160 cm -  Weight 72.303 kg -  Weight (lbs) 159 lbs 6 oz -  BMI (kg/m2) 28.24 kg/m2 -  Temp 99.3 101.4  Pulse 124 112  Resp 19 18  SpO2 99 97  BSA (m2) 1.79 m2 -   BP Readings from Last 2 Encounters:  01/08/16 99/62  01/08/16 (!) 112/53    Physical Exam  Constitutional: She is oriented to person, place, and time. She  appears dehydrated. She appears unhealthy.  HENT:  Head: Normocephalic and atraumatic.  Mouth/Throat: Oropharynx is clear and moist.  Eyes: Conjunctivae and EOM are normal. Pupils are equal, round, and reactive to light. Right eye exhibits no discharge. Left eye exhibits no discharge. No scleral icterus.  Neck: Normal range of motion. Neck supple. No JVD present. No tracheal deviation present. No thyromegaly present.  Cardiovascular: Normal rate, regular rhythm, normal heart sounds and intact distal pulses.   Pulmonary/Chest: Effort normal and breath sounds normal. No respiratory distress. She has no wheezes. She has no rales. She exhibits no tenderness.  Abdominal: Soft. Bowel sounds are normal. She exhibits no distension and no mass. There is no tenderness. There is no rebound and no guarding.  Musculoskeletal: Normal range of motion. She exhibits no edema or tenderness.  Lymphadenopathy:    She has no cervical adenopathy.  Neurological: She is alert and oriented to person, place, and time. Gait normal.  Skin: Skin is warm and dry. No rash noted. No erythema. No pallor.  Psychiatric: Affect normal.  Nursing note and vitals reviewed.   LABORATORY DATA:. Appointment on 01/08/2016  Component Date Value Ref Range Status  . WBC 01/08/2016 0.7* 3.9 - 10.3 10e3/uL Final  . NEUT# 01/08/2016 0.0* 1.5 - 6.5 10e3/uL Final  . HGB 01/08/2016 12.6  11.6 - 15.9 g/dL Final  . HCT 01/08/2016 36.2  34.8 - 46.6 % Final  . Platelets 01/08/2016 154  145 - 400 10e3/uL Final  . MCV 01/08/2016 83.8  79.5 - 101.0 fL Final  . MCH 01/08/2016 29.2  25.1 - 34.0 pg Final  . MCHC 01/08/2016 34.8  31.5 - 36.0 g/dL Final  . RBC 01/08/2016 4.32  3.70 - 5.45 10e6/uL Final  . RDW 01/08/2016 12.7  11.2 - 14.5 % Final  . lymph# 01/08/2016 0.4* 0.9 - 3.3 10e3/uL Final  . MONO# 01/08/2016 0.2  0.1 - 0.9 10e3/uL Final  . Eosinophils Absolute 01/08/2016 0.0  0.0 - 0.5 10e3/uL Final  . Basophils Absolute 01/08/2016 0.0   0.0 - 0.1 10e3/uL Final  . NEUT% 01/08/2016 4.6* 38.4 - 76.8 % Final  . LYMPH% 01/08/2016 60.0* 14.0 - 49.7 % Final  . MONO% 01/08/2016 32.3* 0.0 - 14.0 % Final  . EOS% 01/08/2016 0.0  0.0 - 7.0 % Final  . BASO% 01/08/2016 3.1* 0.0 - 2.0 % Final  . nRBC 01/08/2016 0  0 - 0 % Final  . Sodium 01/08/2016 134* 136 - 145 mEq/L Final  . Potassium 01/08/2016 3.7  3.5 - 5.1 mEq/L Final  . Chloride 01/08/2016 99  98 -  109 mEq/L Final  . CO2 01/08/2016 25  22 - 29 mEq/L Final  . Glucose 01/08/2016 98  70 - 140 mg/dl Final  . BUN 01/08/2016 9.5  7.0 - 26.0 mg/dL Final  . Creatinine 01/08/2016 0.7  0.6 - 1.1 mg/dL Final  . Total Bilirubin 01/08/2016 0.81  0.20 - 1.20 mg/dL Final  . Alkaline Phosphatase 01/08/2016 79  40 - 150 U/L Final  . AST 01/08/2016 29  5 - 34 U/L Final  . ALT 01/08/2016 58* 0 - 55 U/L Final  . Total Protein 01/08/2016 7.1  6.4 - 8.3 g/dL Final  . Albumin 01/08/2016 3.8  3.5 - 5.0 g/dL Final  . Calcium 01/08/2016 9.2  8.4 - 10.4 mg/dL Final  . Anion Gap 01/08/2016 11  3 - 11 mEq/L Final  . EGFR 01/08/2016 88* >90 ml/min/1.73 m2 Final  . Glucose 01/08/2016 Negative  Negative mg/dL Final  . Bilirubin (Urine) 01/08/2016 Negative  Negative Final  . Ketones 01/08/2016 15  Negative mg/dL Final  . Specific Gravity, Urine 01/08/2016 1.010  1.003 - 1.035 Final  . Blood 01/08/2016 Negative  Negative Final  . pH 01/08/2016 6.5  4.6 - 8.0 Final  . Protein 01/08/2016 Negative  Negative- <30 mg/dL Final  . Urobilinogen, UR 01/08/2016 0.2  0.2 - 1 mg/dL Final  . Nitrite 01/08/2016 Negative  Negative Final  . Leukocyte Esterase 01/08/2016 Negative  Negative Final  . RBC / HPF 01/08/2016 0-2  0 - 2 Final  . WBC, UA 01/08/2016 7-10  0 - 2 Final  . Bacteria, UA 01/08/2016 Few  Negative- Trace Final  . Epithelial Cells 01/08/2016 Few  Negative- Few Final  Appointment on 01/07/2016  Component Date Value Ref Range Status  . WBC 01/07/2016 1.2* 3.9 - 10.3 10e3/uL Final  . NEUT# 01/07/2016  0.4* 1.5 - 6.5 10e3/uL Final  . HGB 01/07/2016 13.0  11.6 - 15.9 g/dL Final  . HCT 01/07/2016 38.4  34.8 - 46.6 % Final  . Platelets 01/07/2016 189  145 - 400 10e3/uL Final  . MCV 01/07/2016 85.9  79.5 - 101.0 fL Final  . MCH 01/07/2016 29.2  25.1 - 34.0 pg Final  . MCHC 01/07/2016 34.0  31.5 - 36.0 g/dL Final  . RBC 01/07/2016 4.47  3.70 - 5.45 10e6/uL Final  . RDW 01/07/2016 13.4  11.2 - 14.5 % Final  . lymph# 01/07/2016 0.7* 0.9 - 3.3 10e3/uL Final  . MONO# 01/07/2016 0.0* 0.1 - 0.9 10e3/uL Final  . Eosinophils Absolute 01/07/2016 0.0  0.0 - 0.5 10e3/uL Final  . Basophils Absolute 01/07/2016 0.0  0.0 - 0.1 10e3/uL Final  . NEUT% 01/07/2016 31.6* 38.4 - 76.8 % Final  . LYMPH% 01/07/2016 59.9* 14.0 - 49.7 % Final  . MONO% 01/07/2016 3.9  0.0 - 14.0 % Final  . EOS% 01/07/2016 1.6  0.0 - 7.0 % Final  . BASO% 01/07/2016 3.0* 0.0 - 2.0 % Final  . Sodium 01/07/2016 136  136 - 145 mEq/L Final  . Potassium 01/07/2016 4.0  3.5 - 5.1 mEq/L Final  . Chloride 01/07/2016 101  98 - 109 mEq/L Final  . CO2 01/07/2016 25  22 - 29 mEq/L Final  . Glucose 01/07/2016 94  70 - 140 mg/dl Final  . BUN 01/07/2016 14.2  7.0 - 26.0 mg/dL Final  . Creatinine 01/07/2016 0.8  0.6 - 1.1 mg/dL Final  . Total Bilirubin 01/07/2016 0.76  0.20 - 1.20 mg/dL Final  . Alkaline Phosphatase 01/07/2016 66  40 -  150 U/L Final  . AST 01/07/2016 35* 5 - 34 U/L Final  . ALT 01/07/2016 52  0 - 55 U/L Final  . Total Protein 01/07/2016 6.7  6.4 - 8.3 g/dL Final  . Albumin 01/07/2016 3.7  3.5 - 5.0 g/dL Final  . Calcium 01/07/2016 9.5  8.4 - 10.4 mg/dL Final  . Anion Gap 01/07/2016 10  3 - 11 mEq/L Final  . EGFR 01/07/2016 77* >90 ml/min/1.73 m2 Final    RADIOGRAPHIC STUDIES: Dg Chest 2 View  Result Date: 01/08/2016 CLINICAL DATA:  Breast cancer.  Fever EXAM: CHEST  2 VIEW COMPARISON:  12/30/2015 FINDINGS: Lungs remain clear without evidence of pneumonia. Negative for infiltrate effusion or mass. Port-A-Cath tip remains in  the SVC. IMPRESSION: No active cardiopulmonary disease. Electronically Signed   By: Franchot Gallo M.D.   On: 01/08/2016 13:43    ASSESSMENT/PLAN:    Neutropenic fever ([place]) Patient received her first cycle of carboplatin/Taxotere/Herceptin/Perjeta chemotherapy on 12/31/2015.  She was unable to receive a Neulasta injection for growth for support due to insurance denial.  Patient presented to the [facility] today with complaint of less than a 24-hour history of temperature to maximum 100.4.  She states that she felt extremely tired; but had no other complaints.  She denied any URI symptoms or UTI.  Of note-patient states that she remains hoarse after intubation 2 for Port-A-Cath insertion.  On exam today.  Patient appears tired and very weak.  Patient's lung sounds were clear bilaterally with no cough or wheeze.  Labs obtained today revealed WBC 0.7, ANC 0.0, hemoglobin 12.6, and platelet count 154.  Also, obtained urine and urine culture; as well as blood cultures 2.  Urinalysis was essentially normal; but pending urine culture results.  Chest x-ray was obtained as well and was  normal.  Patient received 1 L IV fluid rehydration while at the [facility] today; as well as Tylenol.  Patient also receives Neupogen 480 g subcutaneously today.  The plan was for the patient did receive Neupogen 480 g for a total of 4 days.  Also, Dr. Lindi Adie advised that he would plan on giving patient Neulasta with the next cycle of chemotherapy.  She  was preparing to be discharged from the [facility]; and was noted to have a temperature that had increased to 101.4 despite Tylenol recently.  Reviewed all findings with Dr. Lindi Adie; and he recommended that patient be direct admitted per the hospitalist.  Patient will be direct admit per Dr. Doyle Askew hospitalist.  Brief history and report were given to Dr. Bjorn Loser; and this provider also, called report to the floor nurse as well.  CODE STATUS: Patient  should be considered a full code; since there are no advanced directives in the patient's chart.    Dehydration Patient appears dehydrated today and sodium was 134.  Patient received IV fluid rehydration while at the cancer Center today.  Breast cancer of upper-inner quadrant of right female breast [facility]) Patient received cycle one of her carboplatin/Taxotere/Herceptin/Perjeta chemotherapy on 12/31/2015.  See notes for details of today's visit.  The initial plan was for the patient to receive Neupogen both today, tomorrow, Friday, and Saturday.  However, decision was made to have patient directed admitted to the hospital once her temperature spiked to 101.4 today.  Dr. Lindi Adie also advised the patient return for labs again this coming Friday, 01/10/2016.  He would also like labs and a follow-up visit with him next week as well.   Patient stated understanding  of all instructions; and was in agreement with this plan of care. The patient knows to call the clinic with any problems, questions or concerns.   Total time spent with patient was 40 minutes;  with greater than 75 percent of that time spent in face to face counseling regarding patient's symptoms,  and coordination of care and follow up.  Disclaimer:This dictation was prepared with Dragon/digital dictation along with [employer]. Any transcriptional errors that result from this process are unintentional.  Drue Second, NP 01/08/2016  D/t temp spike pt to be admitted to hospital.r/o sepsis. Transported via w/c. Husband with patient. '@[id number]'

## 2016-01-08 NOTE — Assessment & Plan Note (Signed)
Patient received cycle one of her carboplatin/Taxotere/Herceptin/Perjeta chemotherapy on 12/31/2015.  See notes for details of today's visit.  The initial plan was for the patient to receive Neupogen both today, tomorrow, Friday, and Saturday.  However, decision was made to have patient directed admitted to the hospital once her temperature spiked to 101.4 today.  Dr. Lindi Adie also advised the patient return for labs again this coming Friday, 01/10/2016.  He would also like labs and a follow-up visit with him next week as well.

## 2016-01-08 NOTE — Assessment & Plan Note (Signed)
Patient appears dehydrated today and sodium was 134.  Patient received IV fluid rehydration while at the cancer Center today.

## 2016-01-08 NOTE — Progress Notes (Signed)
Received patient to [id number], direct admit from [facility], weight and VS obtained, oriented to unit, call light placed in reach

## 2016-01-08 NOTE — Patient Instructions (Signed)
Neutropenia Neutropenia is a condition that occurs when the level of a certain type of white blood cell (neutrophil) in your body becomes lower than normal. Neutrophils are made in the bone marrow and fight infections. These cells protect against bacteria and viruses. The fewer neutrophils you have, and the longer your body remains without them, the greater your risk of getting a severe infection becomes. CAUSES  The cause of neutropenia may be hard to determine. However, it is usually due to 3 main problems:   Decreased production of neutrophils. This may be due to:  Certain medicines such as chemotherapy.  Genetic problems.  Cancer.  Radiation treatments.  Vitamin deficiency.  Some pesticides.  Increased destruction of neutrophils. This may be due to:  Overwhelming infections.  Hemolytic anemia. This is when the body destroys its own blood cells.  Chemotherapy.  Neutrophils moving to areas of the body where they cannot fight infections. This may be due to:  Dialysis procedures.  Conditions where the spleen becomes enlarged. Neutrophils are held in the spleen and are not available to the rest of the body.  Overwhelming infections. The neutrophils are held in the area of the infection and are not available to the rest of the body. SYMPTOMS  There are no specific symptoms of neutropenia. The lack of neutrophils can result in an infection, and an infection can cause various problems. DIAGNOSIS  Diagnosis is made by a blood test. A complete blood count is performed. The normal level of neutrophils in human blood differs with age and race. Infants have lower counts than older children and adults. African Americans have lower counts than Caucasians or Asians. The average adult level is 1500 cells/mm3 of blood. Neutrophil counts are interpreted as follows:  Greater than 1000 cells/mm3 gives normal protection against infection.  500 to 1000 cells/mm3 gives an increased risk for  infection.  200 to 500 cells/mm3 is a greater risk for severe infection.  Lower than 200 cells/mm3 is a marked risk of infection. This may require hospitalization and treatment with antibiotic medicines. TREATMENT  Treatment depends on the underlying cause, severity, and presence of infections or symptoms. It also depends on your health. Your caregiver will discuss the treatment plan with you. Mild cases are often easily treated and have a good outcome. Preventative measures may also be started to limit your risk of infections. Treatment can include:  Taking antibiotics.  Stopping medicines that are known to cause neutropenia.  Correcting nutritional deficiencies by eating green vegetables to supply folic acid and taking vitamin B supplements.  Stopping exposure to pesticides if your neutropenia is related to pesticide exposure.  Taking a blood growth factor called sargramostim, pegfilgrastim, or filgrastim if you are undergoing chemotherapy for cancer. This stimulates white blood cell production.  Removal of the spleen if you have Felty's syndrome and have repeated infections. HOME CARE INSTRUCTIONS   Follow your caregiver's instructions about when you need to have blood work done.  Wash your hands often. Make sure others who come in contact with you also wash their hands.  Wash raw fruits and vegetables before eating them. They can carry bacteria and fungi.  Avoid people with colds or spreadable (contagious) diseases (chickenpox, herpes zoster, influenza).  Avoid large crowds.  Avoid construction areas. The dust can release fungus into the air.  Be cautious around children in daycare or school environments.  Take care of your respiratory system by coughing and deep breathing.  Bathe daily.  Protect your skin from cuts and   burns.  Do not work in the garden or with flowers and plants.  Care for the mouth before and after meals by brushing with a soft toothbrush. If you have  mucositis, do not use mouthwash. Mouthwash contains alcohol and can dry out the mouth even more.  Clean the area between the genitals and the anus (perineal area) after urination and bowel movements. Women need to wipe from front to back.  Use a water soluble lubricant during sexual intercourse and practice good hygiene after. Do not have intercourse if you are severely neutropenic. Check with your caregiver for guidelines.  Exercise daily as tolerated.  Avoid people who were vaccinated with a live vaccine in the past 30 days. You should not receive live vaccines (polio, typhoid).  Do not provide direct care for pets. Avoid animal droppings. Do not clean litter boxes and bird cages.  Do not share food utensils.  Do not use tampons, enemas, or rectal suppositories unless directed by your caregiver.  Use an electric razor to remove hair.  Wash your hands after handling magazines, letters, and newspapers. SEEK IMMEDIATE MEDICAL CARE IF:   You have a fever.  You have chills or start to shake.  You feel nauseous or vomit.  You develop mouth sores.  You develop aches and pains.  You have redness and swelling around open wounds.  Your skin is warm to the touch.  You have pus coming from your wounds.  You develop swollen lymph nodes.  You feel weak or fatigued.  You develop red streaks on the skin. MAKE SURE YOU:  Understand these instructions.  Will watch your condition.  Will get help right away if you are not doing well or get worse.   This information is not intended to replace advice given to you by your health care provider. Make sure you discuss any questions you have with your health care provider.   Document Released: 09/26/2001 Document Revised: 06/29/2011 Document Reviewed: 10/17/2014 Elsevier Interactive Patient Education 2016 Elsevier Inc.    Dehydration, Adult Dehydration is a condition in which you do not have enough fluid or water in your body. It  happens when you take in less fluid than you lose. Vital organs such as the kidneys, brain, and heart cannot function without a proper amount of fluids. Any loss of fluids from the body can cause dehydration.  Dehydration can range from mild to severe. This condition should be treated right away to help prevent it from becoming severe. CAUSES  This condition may be caused by:  Vomiting.  Diarrhea.  Excessive sweating, such as when exercising in hot or humid weather.  Not drinking enough fluid during strenuous exercise or during an illness.  Excessive urine output.  Fever.  Certain medicines. RISK FACTORS This condition is more likely to develop in:  People who are taking certain medicines that cause the body to lose excess fluid (diuretics).   People who have a chronic illness, such as diabetes, that may increase urination.  Older adults.   People who live at high altitudes.   People who participate in endurance sports.  SYMPTOMS  Mild Dehydration  Thirst.  Dry lips.  Slightly dry mouth.  Dry, warm skin. Moderate Dehydration  Very dry mouth.   Muscle cramps.   Dark urine and decreased urine production.   Decreased tear production.   Headache.   Light-headedness, especially when you stand up from a sitting position.  Severe Dehydration  Changes in skin.   Cold and clammy  skin.   Skin does not spring back quickly when lightly pinched and released.   Changes in body fluids.   Extreme thirst.   No tears.   Not able to sweat when body temperature is high, such as in hot weather.   Minimal urine production.   Changes in vital signs.   Rapid, weak pulse (more than 100 beats per minute when you are sitting still).   Rapid breathing.   Low blood pressure.   Other changes.   Sunken eyes.   Cold hands and feet.   Confusion.  Lethargy and difficulty being awakened.  Fainting (syncope).   Short-term weight loss.    Unconsciousness. DIAGNOSIS  This condition may be diagnosed based on your symptoms. You may also have tests to determine how severe your dehydration is. These tests may include:   Urine tests.   Blood tests.  TREATMENT  Treatment for this condition depends on the severity. Mild or moderate dehydration can often be treated at home. Treatment should be started right away. Do not wait until dehydration becomes severe. Severe dehydration needs to be treated at the hospital. Treatment for Mild Dehydration  Drinking plenty of water to replace the fluid you have lost.   Replacing minerals in your blood (electrolytes) that you may have lost.  Treatment for Moderate Dehydration  Consuming oral rehydration solution (ORS). Treatment for Severe Dehydration  Receiving fluid through an IV tube.   Receiving electrolyte solution through a feeding tube that is passed through your nose and into your stomach (nasogastric tube or NG tube).  Correcting any abnormalities in electrolytes. HOME CARE INSTRUCTIONS   Drink enough fluid to keep your urine clear or pale yellow.   Drink water or fluid slowly by taking small sips. You can also try sucking on ice cubes.  Have food or beverages that contain electrolytes. Examples include bananas and sports drinks.  Take over-the-counter and prescription medicines only as told by your health care provider.   Prepare ORS according to the manufacturer's instructions. Take sips of ORS every 5 minutes until your urine returns to normal.  If you have vomiting or diarrhea, continue to try to drink water, ORS, or both.   If you have diarrhea, avoid:   Beverages that contain caffeine.   Fruit juice.   Milk.   Carbonated soft drinks.  Do not take salt tablets. This can lead to the condition of having too much sodium in your body (hypernatremia).  SEEK MEDICAL CARE IF:  You cannot eat or drink without vomiting.  You have had moderate  diarrhea during a period of more than 24 hours.  You have a fever. SEEK IMMEDIATE MEDICAL CARE IF:   You have extreme thirst.  You have severe diarrhea.  You have not urinated in 6-8 hours, or you have urinated only a small amount of very dark urine.  You have shriveled skin.  You are dizzy, confused, or both.   This information is not intended to replace advice given to you by your health care provider. Make sure you discuss any questions you have with your health care provider.   Document Released: 04/06/2005 Document Revised: 12/26/2014 Document Reviewed: 08/22/2014 Elsevier Interactive Patient Education [employer].

## 2016-01-09 ENCOUNTER — Ambulatory Visit: Payer: BLUE CROSS/BLUE SHIELD

## 2016-01-09 ENCOUNTER — Encounter (HOSPITAL_COMMUNITY): Payer: Self-pay | Admitting: Emergency Medicine

## 2016-01-09 DIAGNOSIS — D709 Neutropenia, unspecified: Secondary | ICD-10-CM | POA: Diagnosis not present

## 2016-01-09 DIAGNOSIS — J029 Acute pharyngitis, unspecified: Secondary | ICD-10-CM | POA: Diagnosis not present

## 2016-01-09 DIAGNOSIS — R5081 Fever presenting with conditions classified elsewhere: Secondary | ICD-10-CM

## 2016-01-09 DIAGNOSIS — R531 Weakness: Secondary | ICD-10-CM | POA: Diagnosis present

## 2016-01-09 DIAGNOSIS — C50211 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 female breast: Secondary | ICD-10-CM | POA: Diagnosis not present

## 2016-01-09 DIAGNOSIS — Z8249 Family history of ischemic heart disease and other diseases of the circulatory system: Secondary | ICD-10-CM | POA: Diagnosis not present

## 2016-01-09 DIAGNOSIS — K5900 Constipation, unspecified: Secondary | ICD-10-CM | POA: Diagnosis present

## 2016-01-09 DIAGNOSIS — E876 Hypokalemia: Secondary | ICD-10-CM | POA: Diagnosis present

## 2016-01-09 DIAGNOSIS — Z820 Family history of epilepsy and other diseases of the nervous system: Secondary | ICD-10-CM | POA: Diagnosis not present

## 2016-01-09 DIAGNOSIS — Z803 Family history of malignant neoplasm of breast: Secondary | ICD-10-CM | POA: Diagnosis not present

## 2016-01-09 DIAGNOSIS — Z7952 Long term (current) use of systemic steroids: Secondary | ICD-10-CM | POA: Diagnosis not present

## 2016-01-09 DIAGNOSIS — F419 Anxiety disorder, unspecified: Secondary | ICD-10-CM | POA: Diagnosis not present

## 2016-01-09 DIAGNOSIS — A419 Sepsis, unspecified organism: Secondary | ICD-10-CM | POA: Diagnosis not present

## 2016-01-09 DIAGNOSIS — D61810 Antineoplastic chemotherapy induced pancytopenia: Secondary | ICD-10-CM | POA: Diagnosis not present

## 2016-01-09 DIAGNOSIS — T451X5A Adverse effect of antineoplastic and immunosuppressive drugs, initial encounter: Secondary | ICD-10-CM | POA: Diagnosis not present

## 2016-01-09 DIAGNOSIS — R197 Diarrhea, unspecified: Secondary | ICD-10-CM | POA: Diagnosis not present

## 2016-01-09 DIAGNOSIS — K219 Gastro-esophageal reflux disease without esophagitis: Secondary | ICD-10-CM | POA: Diagnosis not present

## 2016-01-09 DIAGNOSIS — K1230 Oral mucositis (ulcerative), unspecified: Secondary | ICD-10-CM | POA: Diagnosis present

## 2016-01-09 DIAGNOSIS — Z823 Family history of stroke: Secondary | ICD-10-CM | POA: Diagnosis not present

## 2016-01-09 DIAGNOSIS — K5790 Diverticulosis of intestine, part unspecified, without perforation or abscess without bleeding: Secondary | ICD-10-CM | POA: Diagnosis not present

## 2016-01-09 DIAGNOSIS — E43 Unspecified severe protein-calorie malnutrition: Secondary | ICD-10-CM | POA: Diagnosis not present

## 2016-01-09 DIAGNOSIS — Z853 Personal history of malignant neoplasm of breast: Secondary | ICD-10-CM | POA: Diagnosis not present

## 2016-01-09 DIAGNOSIS — Z79899 Other long term (current) drug therapy: Secondary | ICD-10-CM | POA: Diagnosis not present

## 2016-01-09 LAB — RESPIRATORY PANEL BY PCR
ADENOVIRUS-RVPPCR: NOT DETECTED
Bordetella pertussis: NOT DETECTED
CHLAMYDOPHILA PNEUMONIAE-RVPPCR: NOT DETECTED
CORONAVIRUS HKU1-RVPPCR: NOT DETECTED
CORONAVIRUS NL63-RVPPCR: NOT DETECTED
Coronavirus 229E: NOT DETECTED
Coronavirus OC43: NOT DETECTED
Influenza A: NOT DETECTED
Influenza B: NOT DETECTED
MYCOPLASMA PNEUMONIAE-RVPPCR: NOT DETECTED
Metapneumovirus: NOT DETECTED
PARAINFLUENZA VIRUS 1-RVPPCR: NOT DETECTED
PARAINFLUENZA VIRUS 3-RVPPCR: NOT DETECTED
Parainfluenza Virus 2: NOT DETECTED
Parainfluenza Virus 4: NOT DETECTED
RHINOVIRUS / ENTEROVIRUS - RVPPCR: NOT DETECTED
Respiratory Syncytial Virus: NOT DETECTED

## 2016-01-09 LAB — URINE CULTURE

## 2016-01-09 LAB — CBC
HEMATOCRIT: 27.3 % — AB (ref 36.0–46.0)
Hemoglobin: 9.2 g/dL — ABNORMAL LOW (ref 12.0–15.0)
MCH: 29.1 pg (ref 26.0–34.0)
MCHC: 33.7 g/dL (ref 30.0–36.0)
MCV: 86.4 fL (ref 78.0–100.0)
PLATELETS: 108 10*3/uL — AB (ref 150–400)
RBC: 3.16 MIL/uL — AB (ref 3.87–5.11)
RDW: 13 % (ref 11.5–15.5)
WBC: 0.7 10*3/uL — CL (ref 4.0–10.5)

## 2016-01-09 LAB — BASIC METABOLIC PANEL
Anion gap: 5 (ref 5–15)
BUN: 7 mg/dL (ref 6–20)
CO2: 20 mmol/L — ABNORMAL LOW (ref 22–32)
CREATININE: 0.63 mg/dL (ref 0.44–1.00)
Calcium: 7 mg/dL — ABNORMAL LOW (ref 8.9–10.3)
Chloride: 112 mmol/L — ABNORMAL HIGH (ref 101–111)
GFR calc non Af Amer: >60 mL/min (ref >60)
Glucose, Bld: 105 mg/dL — ABNORMAL HIGH (ref 65–99)
Potassium: 3.1 mmol/L — ABNORMAL LOW (ref 3.5–5.1)
SODIUM: 137 mmol/L (ref 135–145)

## 2016-01-09 LAB — C DIFFICILE QUICK SCREEN W PCR REFLEX
C Diff antigen: NEGATIVE
C Diff interpretation: NOT DETECTED
C Diff toxin: NEGATIVE

## 2016-01-09 MED ORDER — VANCOMYCIN HCL IN DEXTROSE 1-5 GM/200ML-% IV SOLN
1000.0000 mg | Freq: Two times a day (BID) | INTRAVENOUS | Status: DC
  Administered 2016-01-09 – 2016-01-12 (×7): 1000 mg via INTRAVENOUS
  Filled 2016-01-09 (×6): qty 200

## 2016-01-09 MED ORDER — LOPERAMIDE HCL 2 MG PO CAPS
4.0000 mg | ORAL_CAPSULE | ORAL | Status: DC | PRN
  Administered 2016-01-09 – 2016-01-11 (×8): 4 mg via ORAL
  Filled 2016-01-09 (×8): qty 2

## 2016-01-09 MED ORDER — POTASSIUM CHLORIDE CRYS ER 20 MEQ PO TBCR
40.0000 meq | EXTENDED_RELEASE_TABLET | Freq: Once | ORAL | Status: AC
  Administered 2016-01-09: 40 meq via ORAL
  Filled 2016-01-09: qty 2

## 2016-01-09 MED ORDER — SODIUM CHLORIDE 0.9 % IV BOLUS (SEPSIS)
500.0000 mL | Freq: Once | INTRAVENOUS | Status: AC
Start: 2016-01-09 — End: 2016-01-09
  Administered 2016-01-09: 500 mL via INTRAVENOUS

## 2016-01-09 MED ORDER — ENSURE ENLIVE PO LIQD
237.0000 mL | Freq: Two times a day (BID) | ORAL | Status: DC
  Administered 2016-01-10 – 2016-01-11 (×3): 237 mL via ORAL

## 2016-01-09 MED ORDER — TBO-FILGRASTIM 480 MCG/0.8ML ~~LOC~~ SOSY
480.0000 ug | PREFILLED_SYRINGE | Freq: Every day | SUBCUTANEOUS | Status: DC
  Administered 2016-01-09 – 2016-01-11 (×3): 480 ug via SUBCUTANEOUS
  Filled 2016-01-09 (×4): qty 0.8

## 2016-01-09 MED ORDER — BOOST / RESOURCE BREEZE PO LIQD
1.0000 | Freq: Three times a day (TID) | ORAL | Status: DC
  Administered 2016-01-09 – 2016-01-12 (×3): 1 via ORAL

## 2016-01-09 MED ORDER — ADULT MULTIVITAMIN W/MINERALS CH
1.0000 | ORAL_TABLET | Freq: Every day | ORAL | Status: DC
  Administered 2016-01-09 – 2016-01-12 (×4): 1 via ORAL
  Filled 2016-01-09 (×4): qty 1

## 2016-01-09 MED ORDER — SODIUM CHLORIDE 0.9 % IV BOLUS (SEPSIS)
500.0000 mL | Freq: Once | INTRAVENOUS | Status: AC
  Administered 2016-01-09: 500 mL via INTRAVENOUS

## 2016-01-09 NOTE — Progress Notes (Signed)
Pharmacy Antibiotic Note  Patricia Chandler is a 60 y.o. female with breast cancer recently started on chemotherapy admitted on 01/08/2016 with febrile neutropenia.  Pharmacy has been consulted for Vancomycin and Cefepime dosing.    Plan:  Continue Cefepime 2g IV q8h  Increase vancomycin to 1g IV q12h for weight 72kg and CrCl ~80 ml/min/1.13m2 (normalized) to target goal trough 15-20 mcg/ml  Height: 5\' 3"  (160 cm) Weight: 159 lb 6.4 oz (72.3 kg) IBW/kg (Calculated) : 52.4  Temp (24hrs), Avg:100.2 F (37.9 C), Min:99.3 F (37.4 C), Max:101.4 F (38.6 C)   Recent Labs Lab 01/07/16 0830 01/07/16 0830 01/08/16 1405 01/08/16 1405 01/08/16 2044 01/08/16 2302 01/09/16 0529  WBC 1.2*  --  0.7*  --   --   --  0.7*  CREATININE  --  0.8  --  0.7  --   --  0.63  LATICACIDVEN  --   --   --   --  2.0* 1.0  --     Estimated Creatinine Clearance: 69.5 mL/min (by C-G formula based on SCr of 0.63 mg/dL).    No Known Allergies  Antimicrobials this admission: 9/20 Vancomycin >>  9/20 Cefepime >>    Dose adjustments this admission: 9/21: Vancomycin 1500mg  IV given 9/20 at ~21:00, increase maintenance dose from 750mg  IV q12ht o 1g IV q12h for weight and renal function    Microbiology results: 9/20 BCx: sent 9/20 UCx: sent  9/20 Respiratory PCR panel: ordered   Thank you for allowing pharmacy to be a part of this patient's care.  Peggyann Juba, PharmD, BCPS Pager: [phone] 01/09/2016, 8:38 AM

## 2016-01-09 NOTE — Progress Notes (Signed)
Patient ID: Patricia Chandler, female   DOB: [birth year], 60 y.o.   MRN: [id number]    PROGRESS NOTE    SUMA ROCCAFORTE  [id number] DOB: 03/20/1956 DOA: 01/08/2016  PCP: Osborne Casco, MD   Brief Narrative:   60 y.o. female with recently diagnosed right breast cancer (received first dose chemotherapy on 12/31/15), diverticulosis, GERD, anxiety, who presented with generalized weakness and fever. She was admitted directly from cancer center for evaluation and treatment for neutropenic fever.   Assessment & Plan:  Neutropenic fever and sepsis  - Source of infection is not clear. Urinalysis and chest x-ray are negative. -  Patient has sore throat, which is likely related to recent intubation.  - currently on vanc and cefepime and will continue same regimen for now, day #2 - blood culture x2 and urine culture still pending  - check Rapid strep and respiratory virus panel - continue IVF and boluses as needed for BP support   Pancytopenia - chemotx related - monitor counts   Breast cancer of upper-inner quadrant of right female breast ([place]) - followed by Dr. Lindi Adie. Had first chemo on 01/01/16. Now has neutropenia fever. - f/u with Dr. Lindi Adie.  Diarrhea - Hold the MiraLAX  - may need to check C. Diff if not better in next 24 hours   GERD: - Protonix  Severe PCM - in the context of chronic illness - appreciate nutritionist recommendations  Hypokalemia - supplement, BMP in AM  DVT prophylaxis: Lovenox SQ Code Status: Full Family Communication: Patient at bedside  Disposition Plan: Home in few day   Consultants:   Nutritionist   Procedures:   None  Antimicrobials:   Vancomycin 9/20 -->  Cefepime 9/20 -->   Subjective: No events overnight.   Objective: Vitals:   01/08/16 1835 01/08/16 2055 01/09/16 0457 01/09/16 0713  BP: 99/62 94/66 (!) 89/45 (!) 88/55  Pulse: (!) 124 (!) 106 (!) 106 100  Resp: 19 18 18    Temp: 99.3 F (37.4 C) 100 F  (37.8 C) 99.6 F (37.6 C)   TempSrc: Oral Oral Oral   SpO2: 99% 99% 97%   Weight: 72.3 kg (159 lb 6.4 oz)     Height: 5\' 3"  (1.6 m)       Intake/Output Summary (Last 24 hours) at 01/09/16 1800 Last data filed at 01/09/16 0649  Gross per 24 hour  Intake           786.25 ml  Output                4 ml  Net           782.25 ml   Filed Weights   01/08/16 1835  Weight: 72.3 kg (159 lb 6.4 oz)    Examination:  General exam: Appears calm and comfortable  Respiratory system: Clear to auscultation. Respiratory effort normal. Cardiovascular system: S1 & S2 heard, RRR. No JVD, murmurs, rubs, gallops or clicks. No pedal edema. Gastrointestinal system: Abdomen is nondistended, soft and nontender. No organomegaly or masses felt. Normal bowel sounds heard. Central nervous system: Alert and oriented. No focal neurological deficits.  Data Reviewed: I have personally reviewed following labs and imaging studies  CBC:  Recent Labs Lab 01/07/16 0830 01/08/16 1405 01/09/16 0529  WBC 1.2* 0.7* 0.7*  NEUTROABS 0.4* 0.0*  --   HGB 13.0 12.6 9.2*  HCT 38.4 36.2 27.3*  MCV 85.9 83.8 86.4  PLT 189 154 [id number]*   Basic Metabolic Panel:  Recent Labs Lab 01/07/16  0830 01/08/16 1405 01/09/16 0529  NA 136 134* 137  K 4.0 3.7 3.1*  CL  --   --  112*  CO2 25 25 20*  GLUCOSE 94 98 105*  BUN 14.2 9.5 7  CREATININE 0.8 0.7 0.63  CALCIUM 9.5 9.2 7.0*   Liver Function Tests:  Recent Labs Lab 01/07/16 0830 01/08/16 1405  AST 35* 29  ALT 52 58*  ALKPHOS 66 79  BILITOT 0.76 0.81  PROT 6.7 7.1  ALBUMIN 3.7 3.8   Coagulation Profile:  Recent Labs Lab 01/08/16 2044  INR 1.06   Urine analysis:    Component Value Date/Time   LABSPEC 1.010 01/08/2016 1401   PHURINE 6.5 01/08/2016 1401   GLUCOSEU Negative 01/08/2016 1401   HGBUR Negative 01/08/2016 1401   BILIRUBINUR Negative 01/08/2016 1401   KETONESUR 15 01/08/2016 1401   PROTEINUR Negative 01/08/2016 1401   UROBILINOGEN 0.2  01/08/2016 1401   NITRITE Negative 01/08/2016 1401   LEUKOCYTESUR Negative 01/08/2016 1401   Recent Results (from the past 240 hour(s))  Culture, Blood     Status: None (Preliminary result)   Collection Time: 01/08/16  2:05 PM  Result Value Ref Range Status   BLOOD CULTURE, ROUTINE Preliminary report  Preliminary   RESULT 1 Comment  Preliminary    Comment: No growth detected at this time.  Culture, blood (single) w Reflex to ID Panel     Status: None (Preliminary result)   Collection Time: 01/08/16  2:27 PM  Result Value Ref Range Status   BLOOD CULTURE, ROUTINE Preliminary report  Preliminary   RESULT 1 Comment  Preliminary    Comment: No growth detected at this time.  Culture, blood (Routine X 2) w Reflex to ID Panel     Status: None (Preliminary result)   Collection Time: 01/08/16  8:35 PM  Result Value Ref Range Status   Specimen Description BLOOD LEFT ARM  Final   Special Requests BOTTLES DRAWN AEROBIC AND ANAEROBIC 5CC EACH  Final   Culture   Final    NO GROWTH < 24 HOURS Performed at [facility]    Report Status PENDING  Incomplete  Culture, blood (Routine X 2) w Reflex to ID Panel     Status: None (Preliminary result)   Collection Time: 01/08/16  8:35 PM  Result Value Ref Range Status   Specimen Description BLOOD LEFT ARM  Final   Special Requests BOTTLES DRAWN AEROBIC ONLY 5CC  Final   Culture   Final    NO GROWTH < 24 HOURS Performed at [facility]    Report Status PENDING  Incomplete  Respiratory Panel by PCR     Status: None   Collection Time: 01/08/16 11:40 PM  Result Value Ref Range Status   Adenovirus NOT DETECTED NOT DETECTED Final   Coronavirus 229E NOT DETECTED NOT DETECTED Final   Coronavirus HKU1 NOT DETECTED NOT DETECTED Final   Coronavirus NL63 NOT DETECTED NOT DETECTED Final   Coronavirus OC43 NOT DETECTED NOT DETECTED Final   Metapneumovirus NOT DETECTED NOT DETECTED Final   Rhinovirus / Enterovirus NOT DETECTED NOT DETECTED Final     Influenza A NOT DETECTED NOT DETECTED Final   Influenza B NOT DETECTED NOT DETECTED Final   Parainfluenza Virus 1 NOT DETECTED NOT DETECTED Final   Parainfluenza Virus 2 NOT DETECTED NOT DETECTED Final   Parainfluenza Virus 3 NOT DETECTED NOT DETECTED Final   Parainfluenza Virus 4 NOT DETECTED NOT DETECTED Final   Respiratory Syncytial Virus  NOT DETECTED NOT DETECTED Final   Bordetella pertussis NOT DETECTED NOT DETECTED Final   Chlamydophila pneumoniae NOT DETECTED NOT DETECTED Final   Mycoplasma pneumoniae NOT DETECTED NOT DETECTED Final    Comment: Performed at [facility]      Radiology Studies: Dg Chest 2 View  Result Date: 01/08/2016 CLINICAL DATA:  Breast cancer.  Fever EXAM: CHEST  2 VIEW COMPARISON:  12/30/2015 FINDINGS: Lungs remain clear without evidence of pneumonia. Negative for infiltrate effusion or mass. Port-A-Cath tip remains in the SVC. IMPRESSION: No active cardiopulmonary disease. Electronically Signed   By: Franchot Gallo M.D.   On: 01/08/2016 13:43      Scheduled Meds: . acidophilus  1 capsule Oral Daily  . calcium carbonate  1 tablet Oral Q breakfast  . ceFEPime (MAXIPIME) IV  2 g Intravenous Q8H  . cholecalciferol  4,000 Units Oral Daily  . enoxaparin (LOVENOX) injection  40 mg Subcutaneous Q24H  . feeding supplement  1 Container Oral TID BM  . [START ON 01/10/2016] feeding supplement (ENSURE ENLIVE)  237 mL Oral BID WC  . magnesium oxide  200 mg Oral Daily  . multivitamin with minerals  1 tablet Oral Daily  . omega-3 acid ethyl esters  1 g Oral Daily  . pantoprazole  40 mg Oral BID  . sodium chloride flush  3 mL Intravenous Q12H  . Tbo-filgastrim (GRANIX) SQ  480 mcg Subcutaneous Daily  . vancomycin  1,000 mg Intravenous Q12H   Continuous Infusions: . sodium chloride 75 mL/hr at 01/09/16 1330     LOS: 1 day   Time spent: 20 minutes   Faye Ramsay, MD [facility] Pager [phone]  If 7PM-7AM, please contact  night-coverage [email or web address] Password [email or web address] 01/09/2016, 6:00 PM

## 2016-01-09 NOTE — Progress Notes (Signed)
Initial Nutrition Assessment  DOCUMENTATION CODES:   Severe malnutrition in context of acute illness/injury  INTERVENTION:  Patient not able to eat significant amount of food right now. Will utilize oral nutrition supplements to meet needs.  Ensure Enlive po BID, each supplement provides 350 kcal and 20 grams of protein.  Boost Breeze po TID, each supplement provides 250 kcal and 9 grams of protein.  Magic cup TID with meals, each supplement provides 290 kcal and 9 grams of protein.  NUTRITION DIAGNOSIS:   Malnutrition (Severe) related to acute illness as evidenced by energy intake < or equal to 50% for > or equal to 5 days, 3 percent weight loss in 1 week.  GOAL:   Patient will meet greater than or equal to 90% of their needs  MONITOR:   PO intake, Supplement acceptance, Weight trends, I & O's, Labs  REASON FOR ASSESSMENT:   Malnutrition Screening Tool    ASSESSMENT:   60 y.o. female with medical history significant of recently diagnosed right breast cancer ( received first dose chemotherapy on 12/31/15), diverticulosis, GERD, anxiety, who presents with generalized weakness and fever.  Patient reports decreased appetite and intake since she initiated chemotherapy on 9/12. She has been eating <25% of usual intake, having only bites of food, or sips of soup/milk. She has been trying to eat 5 small meals per day but it is very hard. UBW 162. She reports she lost 6-8 lbs in one week. Per chart, pt lost 2.4 kg (3% body weight) in one week (from 9/12 to 9/20).   Medications reviewed and include: calcium carbonate 500 mg daily, vitamin D 400 units daily, magnesium oxide 200 mg daily, Lovaza 1 gram daily, pantoprazole, NS @ 75 ml/hr.   Labs reviewed: Potassium 3.1, Glucose 105.   Nutrition-Focused physical exam completed. Findings are no fat depletion, no muscle depletion, and no edema.   Patient wants to try Ensure [employer], [employer], and [employer] to meet protein and calorie  needs until appetite improves. She has more chemotherapy cycles and wants a plan to meet needs when unable to eat food. Encouraged intake of adequate protein and calories through food as soon as she is able to.   Diet Order:  Diet regular Room service appropriate? Yes; Fluid consistency: Thin  Skin:  Reviewed, no issues  Last BM:  01/09/2016  Height:   Ht Readings from Last 1 Encounters:  01/08/16 5\' 3"  (1.6 m)    Weight:   Wt Readings from Last 1 Encounters:  01/08/16 159 lb 6.4 oz (72.3 kg)    Ideal Body Weight:  52.27 kg  BMI:  Body mass index is 28.24 kg/m.  Estimated Nutritional Needs:   Kcal:  2200-2500  Protein:  90-110 grams  Fluid:  2.2-2.5 L/day  EDUCATION NEEDS:   Education needs addressed  Willey Blade, MS, RD, LDN Pager: [phone] After Hours Pager: [phone]

## 2016-01-09 NOTE — Plan of Care (Deleted)
Problem: Pain Managment: Goal: General experience of comfort will improve Outcome: Progressing Pain started out at 10/10 this morning. Scheduled 3mg  Dilaudid every 3 hours added. Pain down to 8/10. Continue with plan of care.

## 2016-01-10 ENCOUNTER — Other Ambulatory Visit: Payer: BLUE CROSS/BLUE SHIELD

## 2016-01-10 ENCOUNTER — Ambulatory Visit: Payer: Self-pay | Admitting: Genetic Counselor

## 2016-01-10 ENCOUNTER — Encounter (HOSPITAL_COMMUNITY): Payer: Self-pay

## 2016-01-10 ENCOUNTER — Telehealth: Payer: Self-pay | Admitting: Genetic Counselor

## 2016-01-10 ENCOUNTER — Encounter: Payer: Self-pay | Admitting: Genetic Counselor

## 2016-01-10 ENCOUNTER — Ambulatory Visit: Payer: BLUE CROSS/BLUE SHIELD

## 2016-01-10 DIAGNOSIS — Z1379 Encounter for other screening for genetic and chromosomal anomalies: Secondary | ICD-10-CM

## 2016-01-10 DIAGNOSIS — Z803 Family history of malignant neoplasm of breast: Secondary | ICD-10-CM

## 2016-01-10 DIAGNOSIS — C50211 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 female breast: Secondary | ICD-10-CM

## 2016-01-10 LAB — CBC WITH DIFFERENTIAL/PLATELET
BASOS PCT: 2 %
Basophils Absolute: 0 10*3/uL (ref 0.0–0.1)
EOS PCT: 0 %
Eosinophils Absolute: 0 10*3/uL (ref 0.0–0.7)
HCT: 25.7 % — ABNORMAL LOW (ref 36.0–46.0)
Hemoglobin: 8.9 g/dL — ABNORMAL LOW (ref 12.0–15.0)
LYMPHS ABS: 0.9 10*3/uL (ref 0.7–4.0)
Lymphocytes Relative: 40 %
MCH: 28.8 pg (ref 26.0–34.0)
MCHC: 34.6 g/dL (ref 30.0–36.0)
MCV: 83.2 fL (ref 78.0–100.0)
MONO ABS: 0.6 10*3/uL (ref 0.1–1.0)
Monocytes Relative: 25 %
NEUTROS ABS: 0.8 10*3/uL — AB (ref 1.7–7.7)
Neutrophils Relative %: 33 %
PLATELETS: 120 10*3/uL — AB (ref 150–400)
RBC: 3.09 MIL/uL — AB (ref 3.87–5.11)
RDW: 13 % (ref 11.5–15.5)
WBC: 2.3 10*3/uL — AB (ref 4.0–10.5)

## 2016-01-10 LAB — COMPREHENSIVE METABOLIC PANEL
ALT: 31 U/L (ref 14–54)
AST: 19 U/L (ref 15–41)
Albumin: 2.7 g/dL — ABNORMAL LOW (ref 3.5–5.0)
Alkaline Phosphatase: 53 U/L (ref 38–126)
Anion gap: 7 (ref 5–15)
BUN: 6 mg/dL (ref 6–20)
CHLORIDE: 110 mmol/L (ref 101–111)
CO2: 20 mmol/L — ABNORMAL LOW (ref 22–32)
Calcium: 7.2 mg/dL — ABNORMAL LOW (ref 8.9–10.3)
Creatinine, Ser: 0.71 mg/dL (ref 0.44–1.00)
Glucose, Bld: 87 mg/dL (ref 65–99)
POTASSIUM: 2.7 mmol/L — AB (ref 3.5–5.1)
Sodium: 137 mmol/L (ref 135–145)
Total Bilirubin: 0.4 mg/dL (ref 0.3–1.2)
Total Protein: 5 g/dL — ABNORMAL LOW (ref 6.5–8.1)

## 2016-01-10 MED ORDER — SACCHAROMYCES BOULARDII 250 MG PO CAPS
250.0000 mg | ORAL_CAPSULE | Freq: Two times a day (BID) | ORAL | Status: DC
  Administered 2016-01-10 – 2016-01-12 (×5): 250 mg via ORAL
  Filled 2016-01-10 (×5): qty 1

## 2016-01-10 MED ORDER — SODIUM CHLORIDE 0.9% FLUSH
10.0000 mL | INTRAVENOUS | Status: DC | PRN
  Administered 2016-01-11 – 2016-01-12 (×3): 10 mL
  Filled 2016-01-10 (×3): qty 40

## 2016-01-10 MED ORDER — POTASSIUM CHLORIDE CRYS ER 20 MEQ PO TBCR
40.0000 meq | EXTENDED_RELEASE_TABLET | Freq: Three times a day (TID) | ORAL | Status: AC
  Administered 2016-01-10 (×3): 40 meq via ORAL
  Filled 2016-01-10 (×3): qty 2

## 2016-01-10 MED ORDER — POTASSIUM CHLORIDE CRYS ER 20 MEQ PO TBCR
40.0000 meq | EXTENDED_RELEASE_TABLET | Freq: Two times a day (BID) | ORAL | Status: DC
  Administered 2016-01-10: 40 meq via ORAL
  Filled 2016-01-10 (×2): qty 2

## 2016-01-10 MED ORDER — CHOLESTYRAMINE 4 G PO PACK
4.0000 g | PACK | ORAL | Status: DC
  Administered 2016-01-10 – 2016-01-11 (×5): 4 g via ORAL
  Filled 2016-01-10 (×8): qty 1

## 2016-01-10 NOTE — Telephone Encounter (Signed)
REvelaed that genetic testing was negative.  We anticipated that it would be since she did not meet her insurance criteria for testing and paid for it OOP.  She felt that it was worth the peace of mind.

## 2016-01-10 NOTE — Progress Notes (Signed)
HPI: [name] was previously seen in the [facility] clinic due to a personal and family history of cancer and concerns regarding a hereditary predisposition to cancer. Please refer to our prior cancer genetics clinic note for more information regarding [name]'s medical, social and family histories, and our assessment and recommendations, at the time. [name] recent genetic test results were disclosed to her, as were recommendations warranted by these results. These results and recommendations are discussed in more detail below.  FAMILY HISTORY:  We obtained a detailed, 4-generation family history.  Significant diagnoses are listed below: Family History  Problem Relation Age of Onset  . Breast cancer Maternal Aunt     dx in her 3s  . Stroke Father   . Hypertension Father   . Pulmonary fibrosis Mother   . Breast cancer Paternal Aunt 78  . Cancer Paternal Grandmother     abdominal; cervical vs uterine dx in her 60s  . Parkinson's disease Maternal Grandfather     The patient has two daughters and a granddaughter who are all cancer free.  She has two brothers, one who died in an accident at 32.  The other is cancer free. Her mother is alive at age 16, and her father died at 55 from a stroke.  Her mother had one sister who had breast cancer at age 12 and died at 42.  There is no other reported cancer on the maternal side of the family.  The patient's father had one sister and one brother.  The sister was diagnosed with breast cancer at 87.  Her paternal grandmother was diagnosed with either cervical or uterine cancer in her 20's.  She had thought that she had breast cancer but her aunt confirmed that she did not have breast cancer but instead uterine or cervical.  Patient's maternal ancestors are of Caucasian descent, and paternal ancestors are of [place] and [place] descent. There is no reported Ashkenazi Jewish ancestry. There is no known consanguinity.  GENETIC  TEST RESULTS: At the time of [name]'s visit, we recommended she pursue genetic testing of the Hereditary common cancer gene panel. The Hereditary Gene Panel offered by [facility] includes sequencing and/or deletion duplication testing of the following 42 genes: APC, ATM, AXIN2, BARD1, BMPR1A, BRCA1, BRCA2, BRIP1, CDH1, CDKN2A, CHEK2, DICER1, EPCAM, GREM1, KIT, MEN1, MLH1, MSH2, MSH6, MUTYH, NBN, NF1, PALB2, PDGFRA, PMS2, POLD1, POLE, PTEN, RAD50, RAD51C, RAD51D, SDHA, SDHB, SDHC, SDHD, SMAD4, SMARCA4. STK11, TP53, TSC1, TSC2, and VHL.  The report date is January 02, 2016.  Genetic testing was normal, and did not reveal a deleterious mutation in these genes. The test report has been scanned into EPIC and is located under the Molecular Pathology section of the Results Review tab.   We discussed with [name] that since the current genetic testing is not perfect, it is possible there may be a gene mutation in one of these genes that current testing cannot detect, but that chance is small. We also discussed, that it is possible that another gene that has not yet been discovered, or that we have not yet tested, is responsible for the cancer diagnoses in the family, and it is, therefore, important to remain in touch with cancer genetics in the future so that we can continue to offer [name] the most up to date genetic testing.   CANCER SCREENING RECOMMENDATIONS: This result is reassuring and indicates that [name] likely does not have an increased risk for a future cancer  due to a mutation in one of these genes. This normal test also suggests that [name]'s cancer was most likely not due to an inherited predisposition associated with one of these genes.  Most cancers happen by chance and this negative test suggests that her cancer falls into this category.  We, therefore, recommended she continue to follow the cancer management and screening guidelines provided by her oncology and primary  healthcare provider.   RECOMMENDATIONS FOR FAMILY MEMBERS: Women in this family might be at some increased risk of developing cancer, over the general population risk, simply due to the family history of cancer. We recommended women in this family have a yearly mammogram beginning at age 60, or 74 years younger than the earliest onset of cancer, an an annual clinical breast exam, and perform monthly breast self-exams. Women in this family should also have a gynecological exam as recommended by their primary provider. All family members should have a colonoscopy by age 35.  FOLLOW-UP: Lastly, we discussed with [name] that cancer genetics is a rapidly advancing field and it is possible that new genetic tests will be appropriate for her and/or her family members in the future. We encouraged her to remain in contact with cancer genetics on an annual basis so we can update her personal and family histories and let her know of advances in cancer genetics that may benefit this family.   Our contact number was provided. [name] questions were answered to her satisfaction, and she knows she is welcome to call us at anytime with additional questions or concerns.   Patricia Kayser, MS, [facility] Certified Genetic Counselor [email or web address]

## 2016-01-10 NOTE — Telephone Encounter (Signed)
LM on VM with good news.  Asked that she CB.  Left CB instructions.

## 2016-01-10 NOTE — Progress Notes (Signed)
PT Cancellation Note  Patient Details Name: Patricia Chandler MRN: [id number] DOB: June 30, 1955   Cancelled Treatment:    Reason Eval/Treat Not Completed: PT screened, no needs identified, will sign off (observed pt ambulating independently in room, she stated she has no difficulty with mobility and is independent with transfers & ambulation. )   [name] 01/10/2016, 10:51 AM  [phone]

## 2016-01-10 NOTE — Progress Notes (Signed)
New order for potassium given by NP schorr

## 2016-01-10 NOTE — Progress Notes (Signed)
CRITICAL VALUE ALERT  Critical value received:  potasium 2.7  Date of notification:  01/10/16  Time of notification:  6.48  Critical value read back:Yes.    Nurse who received alert:  Ellen Henri  MD notified (1st page):  NP schorr  Time of first page:  6.53  MD notified (2nd page):  Time of second page:  Responding MD:  None  Time MD responded:  None

## 2016-01-10 NOTE — Progress Notes (Signed)
Patient ID: Patricia Chandler, female   DOB: 12-02-1955, 60 y.o.   MRN: [id number]    PROGRESS NOTE    Patricia Chandler  [id number] DOB: 01/31/1956 DOA: 01/08/2016  PCP: Osborne Casco, MD   Brief Narrative:   60 y.o. female with recently diagnosed right breast cancer (received first dose chemotherapy on 12/31/15), diverticulosis, GERD, anxiety, who presented with generalized weakness and fever. She was admitted directly from cancer center for evaluation and treatment for neutropenic fever.   Assessment & Plan:  Neutropenic fever and sepsis  - Source of infection is not clear. Urinalysis and chest x-ray are negative. - Patient has sore throat, which is likely related to recent intubation.  - currently on vanc and cefepime and will continue same regimen for now, day #3 - blood culture x2 and urine culture still pending  - WBC and ANC improving  - continue IVF and boluses as needed for BP support   Pancytopenia - chemotx related, blood counts improving  - continue Neuopgen day #3/4 - monitor counts   Breast cancer of upper-inner quadrant of right female breast ([place]) - followed by Dr. Lindi Adie. Had first chemo on 01/01/16. Now has neutropenia fever. - f/u with Dr. Lindi Adie.  Diarrhea - Hold the MiraLAX  - C. Diff negative, provide florastor and questran   GERD: - Protonix  Severe PCM - in the context of chronic illness - appreciate nutritionist recommendations  Hypokalemia - supplement, BMP in AM  DVT prophylaxis: Lovenox SQ Code Status: Full Family Communication: Patient at bedside  Disposition Plan: Home in few day once blood counts stable and pt off IV ABX  Consultants:   Nutritionist   Procedures:   None  Antimicrobials:   Vancomycin 9/20 -->  Cefepime 9/20 -->   Subjective: No events overnight.   Objective: Vitals:   01/09/16 0457 01/09/16 0713 01/09/16 2115 01/10/16 0447  BP: (!) 89/45 (!) 88/55 91/60 107/60  Pulse: (!) 106 100  93 97  Resp: 18  18 18   Temp: 99.6 F (37.6 C)  98.4 F (36.9 C) 98.9 F (37.2 C)  TempSrc: Oral  Oral Oral  SpO2: 97%  98% 98%  Weight:      Height:        Intake/Output Summary (Last 24 hours) at 01/10/16 1321 Last data filed at 01/10/16 0606  Gross per 24 hour  Intake          2336.25 ml  Output                1 ml  Net          2335.25 ml   Filed Weights   01/08/16 1835  Weight: 72.3 kg (159 lb 6.4 oz)    Examination:  General exam: Appears calm and comfortable  Respiratory system: Clear to auscultation. Respiratory effort normal. Cardiovascular system: S1 & S2 heard, RRR. No JVD, murmurs, rubs, gallops or clicks. No pedal edema. Gastrointestinal system: Abdomen is nondistended, soft and nontender. No organomegaly or masses felt. Normal bowel sounds heard. Central nervous system: Alert and oriented. No focal neurological deficits.  Data Reviewed: I have personally reviewed following labs and imaging studies  CBC:  Recent Labs Lab 01/07/16 0830 01/08/16 1405 01/09/16 0529 01/10/16 0535  WBC 1.2* 0.7* 0.7* 2.3*  NEUTROABS 0.4* 0.0*  --  0.8*  HGB 13.0 12.6 9.2* 8.9*  HCT 38.4 36.2 27.3* 25.7*  MCV 85.9 83.8 86.4 83.2  PLT 189 154 108* [id number]*   Basic Metabolic Panel:  Recent Labs Lab 01/07/16 0830 01/08/16 1405 01/09/16 0529 01/10/16 0535  NA 136 134* 137 137  K 4.0 3.7 3.1* 2.7*  CL  --   --  112* 110  CO2 25 25 20* 20*  GLUCOSE 94 98 105* 87  BUN 14.2 9.5 7 6   CREATININE 0.8 0.7 0.63 0.71  CALCIUM 9.5 9.2 7.0* 7.2*   Liver Function Tests:  Recent Labs Lab 01/07/16 0830 01/08/16 1405 01/10/16 0535  AST 35* 29 19  ALT 52 58* 31  ALKPHOS 66 79 53  BILITOT 0.76 0.81 0.4  PROT 6.7 7.1 5.0*  ALBUMIN 3.7 3.8 2.7*   Coagulation Profile:  Recent Labs Lab 01/08/16 2044  INR 1.06   Urine analysis:    Component Value Date/Time   LABSPEC 1.010 01/08/2016 1401   PHURINE 6.5 01/08/2016 1401   GLUCOSEU Negative 01/08/2016 1401   HGBUR  Negative 01/08/2016 1401   BILIRUBINUR Negative 01/08/2016 1401   KETONESUR 15 01/08/2016 1401   PROTEINUR Negative 01/08/2016 1401   UROBILINOGEN 0.2 01/08/2016 1401   NITRITE Negative 01/08/2016 1401   LEUKOCYTESUR Negative 01/08/2016 1401   Recent Results (from the past 240 hour(s))  Urine Culture     Status: None   Collection Time: 01/08/16  2:01 PM  Result Value Ref Range Status   Urine Culture, Routine Final report  Final   Urine Culture result 1 Comment  Final    Comment: Mixed urogenital flora Less than 10,000 colonies/mL   Culture, Blood     Status: None (Preliminary result)   Collection Time: 01/08/16  2:05 PM  Result Value Ref Range Status   BLOOD CULTURE, ROUTINE Preliminary report  Preliminary   RESULT 1 Comment  Preliminary    Comment: No growth in 36 - 48 hours.  Culture, blood (single) w Reflex to ID Panel     Status: None (Preliminary result)   Collection Time: 01/08/16  2:27 PM  Result Value Ref Range Status   BLOOD CULTURE, ROUTINE Preliminary report  Preliminary   RESULT 1 Comment  Preliminary    Comment: No growth in 36 - 48 hours.  Culture, blood (Routine X 2) w Reflex to ID Panel     Status: None (Preliminary result)   Collection Time: 01/08/16  8:35 PM  Result Value Ref Range Status   Specimen Description BLOOD LEFT ARM  Final   Special Requests BOTTLES DRAWN AEROBIC AND ANAEROBIC 5CC EACH  Final   Culture   Final    NO GROWTH < 24 HOURS Performed at [facility]    Report Status PENDING  Incomplete  Culture, blood (Routine X 2) w Reflex to ID Panel     Status: None (Preliminary result)   Collection Time: 01/08/16  8:35 PM  Result Value Ref Range Status   Specimen Description BLOOD LEFT ARM  Final   Special Requests BOTTLES DRAWN AEROBIC ONLY 5CC  Final   Culture   Final    NO GROWTH < 24 HOURS Performed at [facility]    Report Status PENDING  Incomplete  Respiratory Panel by PCR     Status: None   Collection Time: 01/08/16  11:40 PM  Result Value Ref Range Status   Adenovirus NOT DETECTED NOT DETECTED Final   Coronavirus 229E NOT DETECTED NOT DETECTED Final   Coronavirus HKU1 NOT DETECTED NOT DETECTED Final   Coronavirus NL63 NOT DETECTED NOT DETECTED Final   Coronavirus OC43 NOT DETECTED NOT DETECTED Final   Metapneumovirus NOT DETECTED  NOT DETECTED Final   Rhinovirus / Enterovirus NOT DETECTED NOT DETECTED Final   Influenza A NOT DETECTED NOT DETECTED Final   Influenza B NOT DETECTED NOT DETECTED Final   Parainfluenza Virus 1 NOT DETECTED NOT DETECTED Final   Parainfluenza Virus 2 NOT DETECTED NOT DETECTED Final   Parainfluenza Virus 3 NOT DETECTED NOT DETECTED Final   Parainfluenza Virus 4 NOT DETECTED NOT DETECTED Final   Respiratory Syncytial Virus NOT DETECTED NOT DETECTED Final   Bordetella pertussis NOT DETECTED NOT DETECTED Final   Chlamydophila pneumoniae NOT DETECTED NOT DETECTED Final   Mycoplasma pneumoniae NOT DETECTED NOT DETECTED Final    Comment: Performed at [facility]  C difficile quick scan w PCR reflex     Status: None   Collection Time: 01/09/16  7:48 PM  Result Value Ref Range Status   C Diff antigen NEGATIVE NEGATIVE Final   C Diff toxin NEGATIVE NEGATIVE Final   C Diff interpretation No C. difficile detected.  Final      Radiology Studies: Dg Chest 2 View  Result Date: 01/08/2016 CLINICAL DATA:  Breast cancer.  Fever EXAM: CHEST  2 VIEW COMPARISON:  12/30/2015 FINDINGS: Lungs remain clear without evidence of pneumonia. Negative for infiltrate effusion or mass. Port-A-Cath tip remains in the SVC. IMPRESSION: No active cardiopulmonary disease. Electronically Signed   By: Franchot Gallo M.D.   On: 01/08/2016 13:43      Scheduled Meds: . acidophilus  1 capsule Oral Daily  . calcium carbonate  1 tablet Oral Q breakfast  . ceFEPime (MAXIPIME) IV  2 g Intravenous Q8H  . cholecalciferol  4,000 Units Oral Daily  . cholestyramine  4 g Oral 3 times per day  . enoxaparin  (LOVENOX) injection  40 mg Subcutaneous Q24H  . feeding supplement  1 Container Oral TID BM  . feeding supplement (ENSURE ENLIVE)  237 mL Oral BID WC  . magnesium oxide  200 mg Oral Daily  . multivitamin with minerals  1 tablet Oral Daily  . omega-3 acid ethyl esters  1 g Oral Daily  . pantoprazole  40 mg Oral BID  . potassium chloride  40 mEq Oral TID  . potassium chloride  40 mEq Oral BID  . saccharomyces boulardii  250 mg Oral BID  . sodium chloride flush  3 mL Intravenous Q12H  . Tbo-filgastrim (GRANIX) SQ  480 mcg Subcutaneous Daily  . vancomycin  1,000 mg Intravenous Q12H   Continuous Infusions: . sodium chloride 75 mL/hr at 01/10/16 0351     LOS: 2 days   Time spent: 20 minutes   Faye Ramsay, MD [facility] Pager [phone]  If 7PM-7AM, please contact night-coverage [email or web address] Password [email or web address] 01/10/2016, 1:21 PM

## 2016-01-10 NOTE — Progress Notes (Signed)
Injection not given today. Pt is currently in hospital

## 2016-01-11 ENCOUNTER — Ambulatory Visit: Payer: BLUE CROSS/BLUE SHIELD

## 2016-01-11 ENCOUNTER — Inpatient Hospital Stay (HOSPITAL_COMMUNITY): Payer: BLUE CROSS/BLUE SHIELD

## 2016-01-11 DIAGNOSIS — R197 Diarrhea, unspecified: Secondary | ICD-10-CM

## 2016-01-11 DIAGNOSIS — D709 Neutropenia, unspecified: Secondary | ICD-10-CM

## 2016-01-11 DIAGNOSIS — R5081 Fever presenting with conditions classified elsewhere: Secondary | ICD-10-CM

## 2016-01-11 DIAGNOSIS — A419 Sepsis, unspecified organism: Principal | ICD-10-CM

## 2016-01-11 LAB — CBC WITH DIFFERENTIAL/PLATELET
BAND NEUTROPHILS: 33 %
BLASTS: 0 %
Basophils Absolute: 0 10*3/uL (ref 0.0–0.1)
Basophils Relative: 0 %
Eosinophils Absolute: 0 10*3/uL (ref 0.0–0.7)
Eosinophils Relative: 0 %
HEMATOCRIT: 25.4 % — AB (ref 36.0–46.0)
HEMOGLOBIN: 8.8 g/dL — AB (ref 12.0–15.0)
LYMPHS PCT: 18 %
Lymphs Abs: 2.6 10*3/uL (ref 0.7–4.0)
MCH: 28.7 pg (ref 26.0–34.0)
MCHC: 34.6 g/dL (ref 30.0–36.0)
MCV: 82.7 fL (ref 78.0–100.0)
MONOS PCT: 6 %
Metamyelocytes Relative: 2 %
Monocytes Absolute: 0.9 10*3/uL (ref 0.1–1.0)
Myelocytes: 8 %
NEUTROS ABS: 11.2 10*3/uL — AB (ref 1.7–7.7)
NEUTROS PCT: 33 %
NRBC: 1 /100{WBCs} — AB
OTHER: 0 %
PROMYELOCYTES ABS: 0 %
Platelets: 154 10*3/uL (ref 150–400)
RBC: 3.07 MIL/uL — ABNORMAL LOW (ref 3.87–5.11)
RDW: 13.1 % (ref 11.5–15.5)
WBC: 14.7 10*3/uL — ABNORMAL HIGH (ref 4.0–10.5)

## 2016-01-11 LAB — COMPREHENSIVE METABOLIC PANEL
ALK PHOS: 60 U/L (ref 38–126)
ALT: 25 U/L (ref 14–54)
ANION GAP: 6 (ref 5–15)
AST: 18 U/L (ref 15–41)
Albumin: 2.5 g/dL — ABNORMAL LOW (ref 3.5–5.0)
BILIRUBIN TOTAL: 0.5 mg/dL (ref 0.3–1.2)
BUN: <5 mg/dL — ABNORMAL LOW (ref 6–20)
CALCIUM: 7.4 mg/dL — AB (ref 8.9–10.3)
CO2: 21 mmol/L — AB (ref 22–32)
CREATININE: 0.66 mg/dL (ref 0.44–1.00)
Chloride: 110 mmol/L (ref 101–111)
Glucose, Bld: 85 mg/dL (ref 65–99)
Potassium: 2.8 mmol/L — ABNORMAL LOW (ref 3.5–5.1)
Sodium: 137 mmol/L (ref 135–145)
TOTAL PROTEIN: 5 g/dL — AB (ref 6.5–8.1)

## 2016-01-11 LAB — URINE CULTURE: CULTURE: NO GROWTH

## 2016-01-11 LAB — VANCOMYCIN, TROUGH: VANCOMYCIN TR: 15 ug/mL (ref 15–20)

## 2016-01-11 LAB — MAGNESIUM: MAGNESIUM: 1.5 mg/dL — AB (ref 1.7–2.4)

## 2016-01-11 LAB — POTASSIUM: Potassium: 3.4 mmol/L — ABNORMAL LOW (ref 3.5–5.1)

## 2016-01-11 MED ORDER — MAGNESIUM SULFATE 2 GM/50ML IV SOLN
2.0000 g | Freq: Once | INTRAVENOUS | Status: AC
  Administered 2016-01-11: 2 g via INTRAVENOUS
  Filled 2016-01-11: qty 50

## 2016-01-11 MED ORDER — POTASSIUM CHLORIDE 10 MEQ/100ML IV SOLN
10.0000 meq | INTRAVENOUS | Status: AC
Start: 2016-01-11 — End: 2016-01-11
  Administered 2016-01-11 (×6): 10 meq via INTRAVENOUS
  Filled 2016-01-11 (×4): qty 100

## 2016-01-11 NOTE — Progress Notes (Signed)
Pharmacy Antibiotic Note  Patricia Chandler is a 60 y.o. female with breast cancer recently started on chemotherapy admitted on 01/08/2016 with febrile neutropenia, unknown source.  Pharmacy has been consulted for Vancomycin and Cefepime dosing.   Vancomycin trough therapeutic today at 15 mcg/ml on 1g q12h dosing.  Plan:  No adjustment to Vancomycin dose (1g q12h).  Would consider discontinuing vancomycin since patient has been afebrile and cultures have been negative for >48 hours.  Continue Cefepime 2g IV q8h  Granix discontinued today per order instructions to stop once ANC>1000.  [place] [address].  Height: 5\' 3"  (160 cm) Weight: 159 lb 6.4 oz (72.3 kg) IBW/kg (Calculated) : 52.4  Temp (24hrs), Avg:99 F (37.2 C), Min:98.8 F (37.1 C), Max:99.4 F (37.4 C)   Recent Labs Lab 01/07/16 0830 01/07/16 0830 01/08/16 1405 01/08/16 1405 01/08/16 2044 01/08/16 2302 01/09/16 0529 01/10/16 0535 01/11/16 0430 01/11/16 0940  WBC 1.2*  --  0.7*  --   --   --  0.7* 2.3* 14.7*  --   CREATININE  --  0.8  --  0.7  --   --  0.63 0.71 0.66  --   LATICACIDVEN  --   --   --   --  2.0* 1.0  --   --   --   --   VANCOTROUGH  --   --   --   --   --   --   --   --   --  15    Estimated Creatinine Clearance: 69.5 mL/min (by C-G formula based on SCr of 0.66 mg/dL).    No Known Allergies  Antimicrobials this admission: 9/20 Vancomycin>> 9/20 Cefepime>>  Dose adjustments this admission: 9/21: increase vancomycin to 1g q12h per protocol 9/23 VT at 09:30 = 15 on 1g q12h  Microbiology results: 9/20BCx: ngtd 9/20UCx: <10K of mixed urogenital flora  9/20 Respiratory PCR panel: neg 9/21 C.diff: neg Ag/toxin 9/22 UCx: sent  Thank you for allowing pharmacy to be a part of this patient's care.  Hershal Coria, PharmD, BCPS Pager: [phone] 01/11/2016 10:34 AM

## 2016-01-11 NOTE — Progress Notes (Signed)
[facility] PROGRESS NOTE  Patricia Chandler [id number] DOB: 1955/07/02 DOA: 01/08/2016 PCP: Osborne Casco, MD   Brief Narrative:   60 y.o.femalewith recently diagnosed right breast cancer (received first dose chemotherapy on 12/31/15), diverticulosis, GERD, anxiety, who presented with generalized weakness and fever. She was admitted directly from cancer center for evaluation and treatment for neutropenic fever.   Assessment & Plan:  Neutropenic fever and sepsis. Source of infection is not clear. Urinalysis and chest x-ray are negative. But reports mild productive cough has sore throat, which is likely related to recentintubation. We will recheck CXR now with improved leukocyte count  - currently on vanc (has some mucositis) and cefepime and will continue same regimen for 24 hrs. 9/20>>.  - blood culture x2 and urine culture still pending. WBC is improving 14k.   Pancytopenia. Chemo tx related, blood counts improving. continue Neuopgen day #4/4  Breast cancer of upper-inner quadrant of right female breast ([place]) - followed by Dr. Lindi Adie. Had first chemo on 01/01/16. f/u with Dr. Lindi Adie.  Diarrhea. Hold the MiraLAX . C. Diff negative, provide florastor and questran   GERD: Protonix  Severe PCM. in the context of chronic illness. appreciate nutritionist recommendations  Hypokalemia.  Supplement with iv. Check Mg BMP in AM   Code Status: full Family Communication: d/w patient, RN (indicate person spoken with, relationship, and if by phone, the number) Disposition Plan: home 24-48 hrs   Consultants:   Nutritionist   Procedures:   None  Antimicrobials:   Vancomycin 9/20 -->  Cefepime 9/20 -->   HPI/Subjective: Alert, no distress. Reports diarrhea. Has mild productive cough. No acute SOB  Objective: Vitals:   01/10/16 1900 01/11/16 0459  BP: 105/61 95/64  Pulse: 94   Resp: 16 18  Temp: 98.8 F (37.1 C) 99.4 F (37.4 C)     Intake/Output Summary (Last 24 hours) at 01/11/16 1056 Last data filed at 01/11/16 0700  Gross per 24 hour  Intake           2547.5 ml  Output                0 ml  Net           2547.5 ml   Filed Weights   01/08/16 1835  Weight: 72.3 kg (159 lb 6.4 oz)    Exam:   General:  Comfortable   Cardiovascular: s1,s2 rrr  Respiratory: few crackles LL  Abdomen: soft, nt   Musculoskeletal: no leg edema    Data Reviewed: Basic Metabolic Panel:  Recent Labs Lab 01/07/16 0830 01/08/16 1405 01/09/16 0529 01/10/16 0535 01/11/16 0430  NA 136 134* 137 137 137  K 4.0 3.7 3.1* 2.7* 2.8*  CL  --   --  112* 110 110  CO2 25 25 20* 20* 21*  GLUCOSE 94 98 105* 87 85  BUN 14.2 9.5 7 6  <5*  CREATININE 0.8 0.7 0.63 0.71 0.66  CALCIUM 9.5 9.2 7.0* 7.2* 7.4*   Liver Function Tests:  Recent Labs Lab 01/07/16 0830 01/08/16 1405 01/10/16 0535 01/11/16 0430  AST 35* 29 19 18   ALT 52 58* 31 25  ALKPHOS 66 79 53 60  BILITOT 0.76 0.81 0.4 0.5  PROT 6.7 7.1 5.0* 5.0*  ALBUMIN 3.7 3.8 2.7* 2.5*   No results for input(s): LIPASE, AMYLASE in the last 168 hours. No results for input(s): AMMONIA in the last 168 hours. CBC:  Recent Labs Lab 01/07/16 0830 01/08/16 1405 01/09/16 0529 01/10/16 0535 01/11/16 0430  WBC 1.2* 0.7* 0.7* 2.3* 14.7*  NEUTROABS 0.4* 0.0*  --  0.8* 11.2*  HGB 13.0 12.6 9.2* 8.9* 8.8*  HCT 38.4 36.2 27.3* 25.7* 25.4*  MCV 85.9 83.8 86.4 83.2 82.7  PLT 189 154 108* 120* 154   Cardiac Enzymes: No results for input(s): CKTOTAL, CKMB, CKMBINDEX, TROPONINI in the last 168 hours. BNP (last 3 results) No results for input(s): BNP in the last 8760 hours.  ProBNP (last 3 results) No results for input(s): PROBNP in the last 8760 hours.  CBG: No results for input(s): GLUCAP in the last 168 hours.  Recent Results (from the past 240 hour(s))  Urine Culture     Status: None   Collection Time: 01/08/16  2:01 PM  Result Value Ref Range Status   Urine  Culture, Routine Final report  Final   Urine Culture result 1 Comment  Final    Comment: Mixed urogenital flora Less than 10,000 colonies/mL   Culture, Blood     Status: None (Preliminary result)   Collection Time: 01/08/16  2:05 PM  Result Value Ref Range Status   BLOOD CULTURE, ROUTINE Preliminary report  Preliminary   RESULT 1 Comment  Preliminary    Comment: No growth in 36 - 48 hours.  Culture, blood (single) w Reflex to ID Panel     Status: None (Preliminary result)   Collection Time: 01/08/16  2:27 PM  Result Value Ref Range Status   BLOOD CULTURE, ROUTINE Preliminary report  Preliminary   RESULT 1 Comment  Preliminary    Comment: No growth in 36 - 48 hours.  Culture, blood (Routine X 2) w Reflex to ID Panel     Status: None (Preliminary result)   Collection Time: 01/08/16  8:35 PM  Result Value Ref Range Status   Specimen Description BLOOD LEFT ARM  Final   Special Requests BOTTLES DRAWN AEROBIC AND ANAEROBIC 5CC EACH  Final   Culture   Final    NO GROWTH 2 DAYS Performed at [facility]    Report Status PENDING  Incomplete  Culture, blood (Routine X 2) w Reflex to ID Panel     Status: None (Preliminary result)   Collection Time: 01/08/16  8:35 PM  Result Value Ref Range Status   Specimen Description BLOOD LEFT ARM  Final   Special Requests BOTTLES DRAWN AEROBIC ONLY 5CC  Final   Culture   Final    NO GROWTH 2 DAYS Performed at [facility]    Report Status PENDING  Incomplete  Respiratory Panel by PCR     Status: None   Collection Time: 01/08/16 11:40 PM  Result Value Ref Range Status   Adenovirus NOT DETECTED NOT DETECTED Final   Coronavirus 229E NOT DETECTED NOT DETECTED Final   Coronavirus HKU1 NOT DETECTED NOT DETECTED Final   Coronavirus NL63 NOT DETECTED NOT DETECTED Final   Coronavirus OC43 NOT DETECTED NOT DETECTED Final   Metapneumovirus NOT DETECTED NOT DETECTED Final   Rhinovirus / Enterovirus NOT DETECTED NOT DETECTED Final   Influenza  A NOT DETECTED NOT DETECTED Final   Influenza B NOT DETECTED NOT DETECTED Final   Parainfluenza Virus 1 NOT DETECTED NOT DETECTED Final   Parainfluenza Virus 2 NOT DETECTED NOT DETECTED Final   Parainfluenza Virus 3 NOT DETECTED NOT DETECTED Final   Parainfluenza Virus 4 NOT DETECTED NOT DETECTED Final   Respiratory Syncytial Virus NOT DETECTED NOT DETECTED Final   Bordetella pertussis NOT DETECTED NOT DETECTED Final   Chlamydophila  pneumoniae NOT DETECTED NOT DETECTED Final   Mycoplasma pneumoniae NOT DETECTED NOT DETECTED Final    Comment: Performed at [facility]  C difficile quick scan w PCR reflex     Status: None   Collection Time: 01/09/16  7:48 PM  Result Value Ref Range Status   C Diff antigen NEGATIVE NEGATIVE Final   C Diff toxin NEGATIVE NEGATIVE Final   C Diff interpretation No C. difficile detected.  Final     Studies: No results found.  Scheduled Meds: . acidophilus  1 capsule Oral Daily  . ceFEPime (MAXIPIME) IV  2 g Intravenous Q8H  . cholecalciferol  4,000 Units Oral Daily  . cholestyramine  4 g Oral 3 times per day  . enoxaparin (LOVENOX) injection  40 mg Subcutaneous Q24H  . feeding supplement  1 Container Oral TID BM  . feeding supplement (ENSURE ENLIVE)  237 mL Oral BID WC  . magnesium oxide  200 mg Oral Daily  . multivitamin with minerals  1 tablet Oral Daily  . omega-3 acid ethyl esters  1 g Oral Daily  . pantoprazole  40 mg Oral BID  . potassium chloride  10 mEq Intravenous Q1 Hr x 6  . saccharomyces boulardii  250 mg Oral BID  . sodium chloride flush  3 mL Intravenous Q12H  . vancomycin  1,000 mg Intravenous Q12H   Continuous Infusions: . sodium chloride 75 mL/hr at 01/11/16 0908    Principal Problem:   Neutropenic fever (HCC) Active Problems:   Breast cancer of upper-inner quadrant of right female breast (HCC)   Sepsis ([place])   Diarrhea   GERD (gastroesophageal reflux disease)   Fever and neutropenia (HCC)    Time spent: >35  minutes     Kinnie Feil  [facility] Pager [phone]. If 7PM-7AM, please contact night-coverage at [email or web address], password [facility] 01/11/2016, 10:56 AM  LOS: 3 days

## 2016-01-12 LAB — CBC WITH DIFFERENTIAL/PLATELET
BASOS PCT: 0 %
Basophils Absolute: 0 10*3/uL (ref 0.0–0.1)
EOS ABS: 0 10*3/uL (ref 0.0–0.7)
Eosinophils Relative: 0 %
HCT: 25.7 % — ABNORMAL LOW (ref 36.0–46.0)
Hemoglobin: 8.8 g/dL — ABNORMAL LOW (ref 12.0–15.0)
LYMPHS ABS: 15.4 10*3/uL — AB (ref 0.7–4.0)
LYMPHS PCT: 40 %
MCH: 28.6 pg (ref 26.0–34.0)
MCHC: 34.2 g/dL (ref 30.0–36.0)
MCV: 83.4 fL (ref 78.0–100.0)
Monocytes Absolute: 3.8 10*3/uL — ABNORMAL HIGH (ref 0.1–1.0)
Monocytes Relative: 10 %
NEUTROS ABS: 19.2 10*3/uL — AB (ref 1.7–7.7)
Neutrophils Relative %: 50 %
PLATELETS: 164 10*3/uL (ref 150–400)
RBC: 3.08 MIL/uL — ABNORMAL LOW (ref 3.87–5.11)
RDW: 13.6 % (ref 11.5–15.5)
WBC: 38.4 10*3/uL — ABNORMAL HIGH (ref 4.0–10.5)

## 2016-01-12 LAB — COMPREHENSIVE METABOLIC PANEL
ALBUMIN: 2.6 g/dL — AB (ref 3.5–5.0)
ALT: 24 U/L (ref 14–54)
ANION GAP: 6 (ref 5–15)
AST: 22 U/L (ref 15–41)
Alkaline Phosphatase: 126 U/L (ref 38–126)
BILIRUBIN TOTAL: 0.5 mg/dL (ref 0.3–1.2)
CHLORIDE: 111 mmol/L (ref 101–111)
CO2: 22 mmol/L (ref 22–32)
Calcium: 7.8 mg/dL — ABNORMAL LOW (ref 8.9–10.3)
Creatinine, Ser: 0.69 mg/dL (ref 0.44–1.00)
GFR calc Af Amer: >60 mL/min (ref >60)
GFR calc non Af Amer: >60 mL/min (ref >60)
GLUCOSE: 72 mg/dL (ref 65–99)
POTASSIUM: 3.3 mmol/L — AB (ref 3.5–5.1)
Sodium: 139 mmol/L (ref 135–145)
TOTAL PROTEIN: 4.8 g/dL — AB (ref 6.5–8.1)

## 2016-01-12 LAB — MAGNESIUM: Magnesium: 1.9 mg/dL (ref 1.7–2.4)

## 2016-01-12 MED ORDER — POTASSIUM CHLORIDE ER 10 MEQ PO TBCR: 10.0000 meq | EXTENDED_RELEASE_TABLET | Freq: Every day | ORAL | 0 refills | Status: DC

## 2016-01-12 MED ORDER — SACCHAROMYCES BOULARDII 250 MG PO CAPS: 250.0000 mg | ORAL_CAPSULE | Freq: Two times a day (BID) | ORAL | Status: DC

## 2016-01-12 MED ORDER — HEPARIN SOD (PORK) LOCK FLUSH 100 UNIT/ML IV SOLN: 500.0000 [IU] | INTRAVENOUS | Status: DC | PRN

## 2016-01-12 MED ORDER — ADULT MULTIVITAMIN W/MINERALS CH: 1.0000 | ORAL_TABLET | Freq: Every day | ORAL | Status: DC

## 2016-01-12 NOTE — Discharge Summary (Signed)
Physician Discharge Summary  Patricia Chandler [id number] DOB: 1955-08-04 DOA: 01/08/2016  PCP: Osborne Casco, MD  Admit date: 01/08/2016 Discharge date: 01/12/2016  Time spent: >35 minutes  Recommendations for Outpatient Follow-up:  Hematology next week  Discharge Diagnoses:  Principal Problem:   Neutropenic fever ([place]) Active Problems:   Breast cancer of upper-inner quadrant of right female breast ([place])   Sepsis ([place])   Diarrhea   GERD (gastroesophageal reflux disease)   Fever and neutropenia ([place])   Discharge Condition:  Stable   Diet recommendation: regular   Filed Weights   01/08/16 1835  Weight: 72.3 kg (159 lb 6.4 oz)    History of present illness:  60 y.o.femalewith recently diagnosed right breast cancer (received first dose chemotherapy on 12/31/15), diverticulosis, GERD, anxiety, who presented with generalized weakness and fever. She was admitted directly from cancer center for evaluation and treatment for neutropenic fever.   Patient states that she received her first dose of chemotherapy on 12/31/15 for breast cancer. She developed generalized weakness. She was seen by her oncologist, Dr. Lindi Adie yesterday, and was told that her weakness is likely related to chemotherapy. Today she developed fever of 100.5. She was started with Levaquin by Dr. Lindi Adie today. She had negative urinalysis and a chest x-ray. Pt states that after she had intubation for Port-A line placement, she developed sore throat and hoarseness. She was seen by ENT. She was noted to have acid reflux and was prescribed Protonix. She has mild cough with while mucus production. No chest pain or SOB. Patient was constipated, and started with MiraLAX, now has diarrhea. She has 1 or 2 bowel movements with loose stool each day. No nausea, vomiting or abdominal pain pain. Denies symptoms of UTI or unilateral weakness.  ED Course: pt was admitted directly from cancer center.  She was found to have  WBC 6.7, temperature 101.4, tachycardia, no tachypnea, oxygen saturation 99% on room air, electrolytes and renal function okay. Soft blood pressure 99/62. Patient is placed on tele bed for observation.   Hospital Course:   Neutropenic fever with questionable sepsis on admission. No clear source of infection is identified. Urinalysis and chest x-ray are negative, blood cultures remained negative. Patient reported mild productive cough and sore throat, which is likely related to recentintubation.  -She received iv vanc and cefepime treatment for 5 days inpatient, remained hemodynamically stable and afebrile. Neutropenia  has resolved after Neupogen treatment. Recommended to f/u with hem.onc next week  Pancytopenia. Chemo tx related, blood counts improving. continue Neuopgen day #4/4  Breast cancer of upper-inner quadrant of right female breast ([place]) - followed by Dr. Lindi Adie. Had first chemo on 01/01/16. f/u with Dr. Lindi Adie. Plans to f/u next week  Diarrhea. Resolved. c diff negative  GERD: on Protonix Severe PCM. in the context of chronic illness. appreciate nutritionist recommendations Hypokalemia.  Supplemented with iv. And oral potassium at discharge   Procedures:  None  (i.e. Studies not automatically included, echos, thoracentesis, etc; not x-rays)  Consultations:  Hem/onc  Discharge Exam: Vitals:   01/11/16 2131 01/12/16 0637  BP: 103/69 (!) 92/57  Pulse: 87 80  Resp: 18 18  Temp: 97.5 F (36.4 C) 98.1 F (36.7 C)    General: alert, no distress  Cardiovascular: s1,s2 rrr Respiratory: CTA BL  Discharge Instructions  Discharge Instructions    Diet - low sodium heart healthy    Complete by:  As directed    Discharge instructions    Complete by:  As directed  Please follow up with hematology next week   Increase activity slowly    Complete by:  As directed        Medication List    STOP taking these medications   levofloxacin 500 MG tablet Commonly  known as:  LEVAQUIN   polyethylene glycol packet Commonly known as:  MIRALAX / GLYCOLAX     TAKE these medications   ALPRAZolam 0.5 MG tablet Commonly known as:  XANAX Take 1 tablet (0.5 mg total) by mouth at bedtime as needed for anxiety.   calcium carbonate 600 MG Tabs tablet Commonly known as:  OS-CAL Take 600 mg by mouth daily.   dexamethasone 4 MG tablet Commonly known as:  DECADRON Take 1 tablet (4 mg total) by mouth daily. 1 tablet daily before chemotherapy and 1 tablet day after chemotherapy   FISH OIL PO Take 1 capsule by mouth daily.   HYDROcodone-acetaminophen 10-325 MG tablet Commonly known as:  NORCO Take 1 tablet by mouth every 6 (six) hours as needed.   ibuprofen 200 MG tablet Commonly known as:  ADVIL,MOTRIN Take 400-600 mg by mouth every 6 (six) hours as needed for moderate pain.   lidocaine-prilocaine cream Commonly known as:  EMLA Apply to affected area once   loperamide 2 MG capsule Commonly known as:  IMODIUM Take 2 mg by mouth as needed for diarrhea or loose stools.   magnesium 30 MG tablet Take 30 mg by mouth daily.   multivitamin with minerals Tabs tablet Take 1 tablet by mouth daily. Start taking on:  01/13/2016   ondansetron 8 MG tablet Commonly known as:  ZOFRAN Take 1 tablet (8 mg total) by mouth 2 (two) times daily as needed for refractory nausea / vomiting. Start on day 3 after chemo.   pantoprazole 40 MG tablet Commonly known as:  PROTONIX Take 40 mg by mouth 2 (two) times daily.   potassium chloride 10 MEQ tablet Commonly known as:  K-DUR Take 1 tablet (10 mEq total) by mouth daily.   PROBIOTIC DAILY PO Take 1 capsule by mouth daily.   prochlorperazine 10 MG tablet Commonly known as:  COMPAZINE Take 1 tablet (10 mg total) by mouth every 6 (six) hours as needed (Nausea or vomiting).   saccharomyces boulardii 250 MG capsule Commonly known as:  FLORASTOR Take 1 capsule (250 mg total) by mouth 2 (two) times daily.    VITAMIN C PO Take 1 tablet by mouth daily.   Vitamin D3 1000 units Caps Take 4,000 Units by mouth daily.      No Known Allergies    The results of significant diagnostics from this hospitalization (including imaging, microbiology, ancillary and laboratory) are listed below for reference.    Significant Diagnostic Studies: Dg Chest 2 View  Result Date: 01/11/2016 CLINICAL DATA:  Pt c/o continuing, improving cough, sore throat, fever this am, admitted x 4 days ago for weakness, fever, cough, sepsis evaluation. Pt hx recent diagnosis of breast ca (RIGHT), with chemo, admitted post chemo Wednesday. EXAM: CHEST  2 VIEW COMPARISON:  01/08/2016 FINDINGS: Cardiac silhouette is normal in size and configuration. No mediastinal or hilar masses or evidence of adenopathy. There is opacity at the base of the left lower lobe partly silhouetting the hemidiaphragm, new from the prior exam. This may reflect atelectasis or infiltrate, atelectasis favored. Probable minimal left effusion. Remainder of the lungs is clear. No right pleural effusion. No pneumothorax. Stable right anterior chest wall Port-A-Cath. IMPRESSION: 1. Small area of opacity at the base of  the left lower lobe which may reflect pneumonia or atelectasis, with atelectasis favored. There is a probable associated minimal left pleural effusion. 2. Lungs otherwise clear.  No other change from the prior study. Electronically Signed   By: Lajean Manes M.D.   On: 01/11/2016 12:41   Dg Chest 2 View  Result Date: 01/08/2016 CLINICAL DATA:  Breast cancer.  Fever EXAM: CHEST  2 VIEW COMPARISON:  12/30/2015 FINDINGS: Lungs remain clear without evidence of pneumonia. Negative for infiltrate effusion or mass. Port-A-Cath tip remains in the SVC. IMPRESSION: No active cardiopulmonary disease. Electronically Signed   By: Franchot Gallo M.D.   On: 01/08/2016 13:43   Mr Breast Bilateral W Wo Contrast  Result Date: 12/18/2015 CLINICAL DATA:  Biopsy proven  invasive mammary carcinoma in the right breast at 2 o'clock 6 cm from the nipple and in the right breast at 2 o'clock 4 cm from the nipple. Diagnostic evaluation showed a third smaller mass located in between the the 2 biopsied masses. Patient had a stereotactic biopsy of the right breast on 05/25/2013 which showed fibrocystic changes with microcalcifications. LABS:  Creatinine was obtained on site at [facility] at [address] Results: Creatinine 0.7 mg/dL. EXAM: BILATERAL BREAST MRI WITH AND WITHOUT CONTRAST TECHNIQUE: Multiplanar, multisequence MR images of both breasts were obtained prior to and following the intravenous administration of 15 ml of MultiHance. THREE-DIMENSIONAL MR IMAGE RENDERING ON INDEPENDENT WORKSTATION: Three-dimensional MR images were rendered by post-processing of the original MR data on an independent workstation. The three-dimensional MR images were interpreted, and findings are reported in the following complete MRI report for this study. Three dimensional images were evaluated at the independent [employer] workstation COMPARISON:  Previous exam(s). Last bilateral diagnostic mammogram was performed 05/28/2015. FINDINGS: Breast composition: c. Heterogeneous fibroglandular tissue. Background parenchymal enhancement: Moderate. Right breast: In the upper-inner quadrant of the right breast are 3 adjacent enhancing masses measuring 1.7 x 1.5 x 1.4 cm, 1.0 x 0.7 x 0.6 cm and 1.4 x 1.3 x 1.3 cm. The masses span an area of 3.7 x 1.4 x 1.4 cm in anterior posterior, transverse and craniocaudal dimensions. Three signal void artifacts are seen in this area. There is a well-circumscribed 8 mm enhancing mass in the upper-outer quadrant of the right breast corresponding with an intramammary lymph node that has been stable since 2013. Left breast: No mass or abnormal enhancement. Lymph nodes: No abnormal appearing lymph nodes. Ancillary findings:  None. IMPRESSION: Three enhancing masses in  the upper-inner quadrant of the right breast spanning an area of 3.7 cm corresponding with the biopsy proven invasive mammary carcinomas. RECOMMENDATION: Treatment planning of the right breast is recommended. Patient's left breast has not been imaged since February of 2017. I recommend diagnostic left mammogram prior to treatment planning. BI-RADS CATEGORY  6: Known biopsy-proven malignancy. Electronically Signed   By: Lillia Mountain M.D.   On: 12/18/2015 11:01   Dg Chest Port 1 View  Result Date: 12/30/2015 CLINICAL DATA:  RIGHT-sided Port-A-Cath insertion, breast cancer EXAM: PORTABLE CHEST 1 VIEW COMPARISON:  Portable exam time-stamped as 1651 hours compared to 02/04/2012 FINDINGS: RIGHT jugular Port-A-Cath with tip projecting over SVC. Normal heart size, mediastinal contours and pulmonary vascularity. Atherosclerotic calcification aorta. Lungs clear. No pleural effusion or pneumothorax. Bones unremarkable. IMPRESSION: No pneumothorax following RIGHT jugular Port-A-Cath insertion. Aortic atherosclerosis. Electronically Signed   By: Lavonia Dana M.D.   On: 12/30/2015 16:37   Dg Fluoro Guide Cv Line-no Report  Result Date: 12/30/2015 CLINICAL  DATA:  FLOURO GUIDE CV LINE Fluoroscopy was utilized by the requesting physician.  No radiographic interpretation.    Microbiology: Recent Results (from the past 240 hour(s))  Urine Culture     Status: None   Collection Time: 01/08/16  2:01 PM  Result Value Ref Range Status   Urine Culture, Routine Final report  Final   Urine Culture result 1 Comment  Final    Comment: Mixed urogenital flora Less than 10,000 colonies/mL   Culture, Blood     Status: None (Preliminary result)   Collection Time: 01/08/16  2:05 PM  Result Value Ref Range Status   BLOOD CULTURE, ROUTINE Preliminary report  Preliminary   RESULT 1 Comment  Preliminary    Comment: No growth in 36 - 48 hours.  Culture, blood (single) w Reflex to ID Panel     Status: None (Preliminary result)    Collection Time: 01/08/16  2:27 PM  Result Value Ref Range Status   BLOOD CULTURE, ROUTINE Preliminary report  Preliminary   RESULT 1 Comment  Preliminary    Comment: No growth in 36 - 48 hours.  Culture, blood (Routine X 2) w Reflex to ID Panel     Status: None (Preliminary result)   Collection Time: 01/08/16  8:35 PM  Result Value Ref Range Status   Specimen Description BLOOD LEFT ARM  Final   Special Requests BOTTLES DRAWN AEROBIC AND ANAEROBIC 5CC EACH  Final   Culture NO GROWTH 3 DAYS  Final   Report Status PENDING  Incomplete  Culture, blood (Routine X 2) w Reflex to ID Panel     Status: None (Preliminary result)   Collection Time: 01/08/16  8:35 PM  Result Value Ref Range Status   Specimen Description BLOOD LEFT ARM  Final   Special Requests BOTTLES DRAWN AEROBIC ONLY 5CC  Final   Culture NO GROWTH 3 DAYS  Final   Report Status PENDING  Incomplete  Respiratory Panel by PCR     Status: None   Collection Time: 01/08/16 11:40 PM  Result Value Ref Range Status   Adenovirus NOT DETECTED NOT DETECTED Final   Coronavirus 229E NOT DETECTED NOT DETECTED Final   Coronavirus HKU1 NOT DETECTED NOT DETECTED Final   Coronavirus NL63 NOT DETECTED NOT DETECTED Final   Coronavirus OC43 NOT DETECTED NOT DETECTED Final   Metapneumovirus NOT DETECTED NOT DETECTED Final   Rhinovirus / Enterovirus NOT DETECTED NOT DETECTED Final   Influenza A NOT DETECTED NOT DETECTED Final   Influenza B NOT DETECTED NOT DETECTED Final   Parainfluenza Virus 1 NOT DETECTED NOT DETECTED Final   Parainfluenza Virus 2 NOT DETECTED NOT DETECTED Final   Parainfluenza Virus 3 NOT DETECTED NOT DETECTED Final   Parainfluenza Virus 4 NOT DETECTED NOT DETECTED Final   Respiratory Syncytial Virus NOT DETECTED NOT DETECTED Final   Bordetella pertussis NOT DETECTED NOT DETECTED Final   Chlamydophila pneumoniae NOT DETECTED NOT DETECTED Final   Mycoplasma pneumoniae NOT DETECTED NOT DETECTED Final    Comment: Performed at  [facility]  C difficile quick scan w PCR reflex     Status: None   Collection Time: 01/09/16  7:48 PM  Result Value Ref Range Status   C Diff antigen NEGATIVE NEGATIVE Final   C Diff toxin NEGATIVE NEGATIVE Final   C Diff interpretation No C. difficile detected.  Final  Urine culture     Status: None   Collection Time: 01/10/16  3:35 PM  Result Value Ref Range  Status   Specimen Description URINE, CLEAN CATCH  Final   Special Requests NONE  Final   Culture NO GROWTH Performed at [facility]   Final   Report Status 01/11/2016 FINAL  Final     Labs: Basic Metabolic Panel:  Recent Labs Lab 01/08/16 1405 01/09/16 0529 01/10/16 0535 01/11/16 0430 01/11/16 0940 01/11/16 2045 01/12/16 0520  NA 134* 137 137 137  --   --  139  K 3.7 3.1* 2.7* 2.8*  --  3.4* 3.3*  CL  --  112* 110 110  --   --  111  CO2 25 20* 20* 21*  --   --  22  GLUCOSE 98 105* 87 85  --   --  72  BUN 9.5 7 6  <5*  --   --  <5*  CREATININE 0.7 0.63 0.71 0.66  --   --  0.69  CALCIUM 9.2 7.0* 7.2* 7.4*  --   --  7.8*  MG  --   --   --   --  1.5*  --  1.9   Liver Function Tests:  Recent Labs Lab 01/07/16 0830 01/08/16 1405 01/10/16 0535 01/11/16 0430 01/12/16 0520  AST 35* 29 19 18 22   ALT 52 58* 31 25 24   ALKPHOS 66 79 53 60 126  BILITOT 0.76 0.81 0.4 0.5 0.5  PROT 6.7 7.1 5.0* 5.0* 4.8*  ALBUMIN 3.7 3.8 2.7* 2.5* 2.6*   No results for input(s): LIPASE, AMYLASE in the last 168 hours. No results for input(s): AMMONIA in the last 168 hours. CBC:  Recent Labs Lab 01/07/16 0830 01/08/16 1405 01/09/16 0529 01/10/16 0535 01/11/16 0430 01/12/16 0520  WBC 1.2* 0.7* 0.7* 2.3* 14.7* 38.4*  NEUTROABS 0.4* 0.0*  --  0.8* 11.2* 19.2*  HGB 13.0 12.6 9.2* 8.9* 8.8* 8.8*  HCT 38.4 36.2 27.3* 25.7* 25.4* 25.7*  MCV 85.9 83.8 86.4 83.2 82.7 83.4  PLT 189 154 108* 120* 154 164   Cardiac Enzymes: No results for input(s): CKTOTAL, CKMB, CKMBINDEX, TROPONINI in the last 168  hours. BNP: BNP (last 3 results) No results for input(s): BNP in the last 8760 hours.  ProBNP (last 3 results) No results for input(s): PROBNP in the last 8760 hours.  CBG: No results for input(s): GLUCAP in the last 168 hours.     SignedKinnie Feil  [facility] 01/12/2016, 11:18 AM

## 2016-01-12 NOTE — Progress Notes (Signed)
Pt discharged to home. DC instructions given with husband at bedside. Prescriptions also given for florastor. Pt to stop by her pharmacy in [place] and pick drug that was e-prescribed by MD,. Pt made aware. Right chest port a cath flushed and heparinized per protocol; then deaccessed. Needle intact. Gauze and paper tape applied to site. Pt left unit in wheelchair pushed by nurse Tech accompanied by husband. Left in good condition. No concerns voiced. VWilliams,rn.

## 2016-01-13 ENCOUNTER — Ambulatory Visit (HOSPITAL_BASED_OUTPATIENT_CLINIC_OR_DEPARTMENT_OTHER): Payer: BLUE CROSS/BLUE SHIELD | Admitting: Hematology and Oncology

## 2016-01-13 ENCOUNTER — Telehealth: Payer: Self-pay | Admitting: *Deleted

## 2016-01-13 ENCOUNTER — Encounter: Payer: Self-pay | Admitting: Hematology and Oncology

## 2016-01-13 ENCOUNTER — Telehealth: Payer: Self-pay | Admitting: Hematology and Oncology

## 2016-01-13 DIAGNOSIS — D6481 Anemia due to antineoplastic chemotherapy: Secondary | ICD-10-CM | POA: Diagnosis not present

## 2016-01-13 DIAGNOSIS — C50211 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 female breast: Secondary | ICD-10-CM | POA: Diagnosis not present

## 2016-01-13 DIAGNOSIS — R609 Edema, unspecified: Secondary | ICD-10-CM | POA: Diagnosis not present

## 2016-01-13 LAB — CULTURE, BLOOD (ROUTINE X 2)
CULTURE: NO GROWTH
Culture: NO GROWTH

## 2016-01-13 LAB — PATHOLOGIST SMEAR REVIEW

## 2016-01-13 MED ORDER — FUROSEMIDE 20 MG PO TABS: 20.0000 mg | ORAL_TABLET | Freq: Every day | ORAL | 0 refills | Status: DC

## 2016-01-13 MED FILL — FUROSEMIDE 20 MG TABLET: 20 | 30 days supply | Qty: 30 | Fill #0

## 2016-01-13 NOTE — Progress Notes (Signed)
Patient Care Team: Kelton Pillar, MD as PCP - General (Family Medicine)  SUMMARY OF ONCOLOGIC HISTORY:   Breast cancer of upper-inner quadrant of right female breast ([place])   12/05/2015 Mammogram    Right breast mass 2:00 4 cm from nipple: 1.7 cm, T1c N0 stage IA; right breast 2:00 6 cm from nipple 1.3 cm mass, 7 mm satellite nodule between the 2 masses, T1 cN0 stage IA      12/06/2015 Initial Diagnosis    Right breast biopsy 2:00 6 cm from nipple: Grade 2-3 invasive ductal cancer, ER 90%, PR 2%, Ki-67 80%, HER-2 positive ratio 2.07; right biopsy 2:00 4 cm from nipple: IDC with DCIS, ER 95%, PR 10%, Ki-67 90%, HER-2 negative ratio 1.27      12/18/2015 Breast MRI    3 enhancing masses in the upper inner quadrant right breast spanning an area 3.7 cm (1.7 cm, 1 cm, 1.4 cm) no lymph node enlargement      12/31/2015 -  Neo-Adjuvant Chemotherapy    TCH Perjeta 6 cycles followed by Herceptin maintenance for 1 year      01/08/2016 - 01/12/2016 Hospital Admission    Neutropenic fever hospitalization (because patient did not receive Neulasta with cycle 1)       CHIEF COMPLIANT: Follow-up after recent hospitalization, complaining of leg edema  INTERVAL HISTORY: Patricia Chandler is a 60 year old with above-mentioned history of right breast cancer currently on neoadjuvant chemotherapy today is a follow-up after recent hospitalization for neutropenic fever admission. Blood cultures urine cultures were negative. During hospitalization she had diarrhea and was found to have hypokalemia. She was given lots of fluids because of hypotension while she was in the hospital and she had gained about 8 pounds total weight. Today she is here for an urgent visit because her legs are swollen.  REVIEW OF SYSTEMS:   Constitutional: Denies fevers, chills or abnormal weight loss Eyes: Denies blurriness of vision Ears, nose, mouth, throat, and face: Denies mucositis or sore throat Respiratory: Denies cough,  dyspnea or wheezes Cardiovascular: Denies palpitation, chest discomfort Gastrointestinal:  Denies nausea, heartburn or change in bowel habits Skin: Denies abnormal skin rashes Lymphatics: Denies new lymphadenopathy or easy bruising Neurological:Denies numbness, tingling or new weaknesses Behavioral/Psych: Mood is stable, no new changes  Extremities: Significant bilateral lower extremity edema  All other systems were reviewed with the patient and are negative.  I have reviewed the past medical history, past surgical history, social history and family history with the patient and they are unchanged from previous note.  ALLERGIES:  has No Known Allergies.  MEDICATIONS:  Current Outpatient Prescriptions  Medication Sig Dispense Refill  . ALPRAZolam (XANAX) 0.5 MG tablet Take 1 tablet (0.5 mg total) by mouth at bedtime as needed for anxiety. 60 tablet 0  . Ascorbic Acid (VITAMIN C PO) Take 1 tablet by mouth daily.     . calcium carbonate (OS-CAL) 600 MG TABS tablet Take 600 mg by mouth daily.    . Cholecalciferol (VITAMIN D3) 1000 units CAPS Take 4,000 Units by mouth daily.    Marland Kitchen dexamethasone (DECADRON) 4 MG tablet Take 1 tablet (4 mg total) by mouth daily. 1 tablet daily before chemotherapy and 1 tablet day after chemotherapy 15 tablet 0  . furosemide (LASIX) 20 MG tablet Take 1 tablet (20 mg total) by mouth daily. 30 tablet 0  . HYDROcodone-acetaminophen (NORCO) 10-325 MG tablet Take 1 tablet by mouth every 6 (six) hours as needed. 10 tablet 0  . ibuprofen (ADVIL,MOTRIN)  200 MG tablet Take 400-600 mg by mouth every 6 (six) hours as needed for moderate pain.    Marland Kitchen lidocaine-prilocaine (EMLA) cream Apply to affected area once 30 g 3  . loperamide (IMODIUM) 2 MG capsule Take 2 mg by mouth as needed for diarrhea or loose stools.    . magnesium 30 MG tablet Take 30 mg by mouth daily.     . Multiple Vitamin (MULTIVITAMIN WITH MINERALS) TABS tablet Take 1 tablet by mouth daily.    . Omega-3 Fatty  Acids (FISH OIL PO) Take 1 capsule by mouth daily.     . ondansetron (ZOFRAN) 8 MG tablet Take 1 tablet (8 mg total) by mouth 2 (two) times daily as needed for refractory nausea / vomiting. Start on day 3 after chemo. 30 tablet 1  . pantoprazole (PROTONIX) 40 MG tablet Take 40 mg by mouth 2 (two) times daily.   0  . potassium chloride (K-DUR) 10 MEQ tablet Take 1 tablet (10 mEq total) by mouth daily. 3 tablet 0  . Probiotic Product (PROBIOTIC DAILY PO) Take 1 capsule by mouth daily.     . prochlorperazine (COMPAZINE) 10 MG tablet Take 1 tablet (10 mg total) by mouth every 6 (six) hours as needed (Nausea or vomiting). 30 tablet 1  . saccharomyces boulardii (FLORASTOR) 250 MG capsule Take 1 capsule (250 mg total) by mouth 2 (two) times daily.     No current facility-administered medications for this visit.     PHYSICAL EXAMINATION: ECOG PERFORMANCE STATUS: 1 - Symptomatic but completely ambulatory  Vitals:   01/13/16 0900  BP: 139/77  Pulse: 98  Resp: 18  Temp: 98.2 F (36.8 C)   Filed Weights   01/13/16 0900  Weight: 167 lb 6.4 oz (75.9 kg)    GENERAL:alert, no distress and comfortable SKIN: skin color, texture, turgor are normal, no rashes or significant lesions EYES: normal, Conjunctiva are pink and non-injected, sclera clear OROPHARYNX:no exudate, no erythema and lips, buccal mucosa, and tongue normal  NECK: supple, thyroid normal size, non-tender, without nodularity LYMPH:  no palpable lymphadenopathy in the cervical, axillary or inguinal LUNGS: clear to auscultation and percussion with normal breathing effort HEART: regular rate & rhythm and no murmurs and no lower extremity edema ABDOMEN:abdomen soft, non-tender and normal bowel sounds MUSCULOSKELETAL:no cyanosis of digits and no clubbing  NEURO: alert & oriented x 3 with fluent speech, no focal motor/sensory deficits EXTREMITIES: 2+ lower extremity edema  LABORATORY DATA:  I have reviewed the data as listed   Chemistry       Component Value Date/Time   NA 139 01/12/2016 0520   NA 134 (L) 01/08/2016 1405   K 3.3 (L) 01/12/2016 0520   K 3.7 01/08/2016 1405   CL 111 01/12/2016 0520   CO2 22 01/12/2016 0520   CO2 25 01/08/2016 1405   BUN <5 (L) 01/12/2016 0520   BUN 9.5 01/08/2016 1405   CREATININE 0.69 01/12/2016 0520   CREATININE 0.7 01/08/2016 1405      Component Value Date/Time   CALCIUM 7.8 (L) 01/12/2016 0520   CALCIUM 9.2 01/08/2016 1405   ALKPHOS 126 01/12/2016 0520   ALKPHOS 79 01/08/2016 1405   AST 22 01/12/2016 0520   AST 29 01/08/2016 1405   ALT 24 01/12/2016 0520   ALT 58 (H) 01/08/2016 1405   BILITOT 0.5 01/12/2016 0520   BILITOT 0.81 01/08/2016 1405       Lab Results  Component Value Date   WBC 38.4 (H) 01/12/2016  HGB 8.8 (L) 01/12/2016   HCT 25.7 (L) 01/12/2016   MCV 83.4 01/12/2016   PLT 164 01/12/2016   NEUTROABS 19.2 (H) 01/12/2016     ASSESSMENT & PLAN:  Breast cancer of upper-inner quadrant of right female breast ([place]) Right breast biopsy 2:00 6 cm from nipple: Grade 2-3 invasive ductal cancer, ER 90%, PR 2%, Ki-67 80%, HER-2 positive ratio 2.07; right biopsy 2:00 4 cm from nipple: IDC with DCIS, ER 95%, PR 10%, Ki-67 90%, HER-2 negative ratio 1.27 Breast MRI08/30/2017: 3 enhancing masses in the upper inner quadrant right breast spanning an area 3.7 cm (1.7 cm, 1 cm, 1.4 cm) no lymph node enlargement Clinical stage: T2N0 (stage 2A)  Treatment plan: 1. Genetic counseling 2. Neoadjuvant chemotherapy. ([place] Perjeta 6 cycles followed by Herceptin maintenance for one year) 3. Breast conserving surgery followed by 4. Adjuvant radiation therapy followed by 5. Adjuvant antiestrogen therapy -------------------------------------------------------------------------------------------------------------------------------------------------- Current treatment: [place] Perjeta Echocardiogram 12/26/2015: EF 60-65%  Chemotherapy toxicities: 1. Hospitalization: 01/08/2016 to  01/12/2016 for neutropenic fever (because her insurance company denied Neulasta for cycle 1): We will plan on giving Neulasta with cycle 2 and will get insurance authorization again. Cultures were negative. 2. leg edema: Sent a prescription for Lasix 20 mg daily 01/13/2016 3. Severe anemia: Hemoglobin 8.8: Baseline hemoglobin of 13 g. Related to chemotherapy and hemodilution.  4. Hypokalemia: During to hospitalization where she had diarrhea. Diarrhea has subsided.  Throat inflammation: Improving slowly with time.  Monitoring closely for chemotherapy toxicities  Return to clinic in 1 week for cycle 2   No orders of the defined types were placed in this encounter.  The patient has a good understanding of the overall plan. she agrees with it. she will call with any problems that may develop before the next visit here.   Rulon Eisenmenger, MD 01/13/16

## 2016-01-13 NOTE — Telephone Encounter (Signed)
09/27 Appointments canceled per 09/25 LOS.

## 2016-01-13 NOTE — Telephone Encounter (Signed)
Pt left voice mail stating she was discharged from hospital Sunday at 2:00pm. Called on call MD in middle of night due to severe edema. Was instructed to go to ED, but she declined as she had just been released after neutropenic fever. Is on her way to [facility].   Dr Lindi Adie will fit her in this morning

## 2016-01-13 NOTE — Telephone Encounter (Signed)
Seen for f/u  01-07-2016.

## 2016-01-13 NOTE — Assessment & Plan Note (Addendum)
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 Genetic counseling 2. Neoadjuvant chemotherapy. ([place] Perjeta 6 cycles followed by Herceptin maintenance for one year) 3. Breast conserving surgery followed by 4. Adjuvant radiation therapy followed by 5. Adjuvant antiestrogen therapy -------------------------------------------------------------------------------------------------------------------------------------------------- Current treatment: [place] Perjeta Echocardiogram 12/26/2015: EF 60-65%  Chemotherapy toxicities: 1. Hospitalization: 01/08/2016 to 01/12/2016 for neutropenic fever (because her insurance company denied Neulasta for cycle 1): We will plan on giving Neulasta with cycle 2 and will get insurance authorization again. Cultures were negative. 2. leg edema: Sent a prescription for Lasix 20 mg daily 01/13/2016 3. Severe anemia: Hemoglobin 8.8: Baseline hemoglobin of 13 g. Related to chemotherapy and hemodilution.  4. Hypokalemia: During to hospitalization where she had diarrhea. Diarrhea has subsided.  Throat inflammation: Improving slowly with time.  Monitoring closely for chemotherapy toxicities  Return to clinic in 1 week for cycle 2

## 2016-01-14 LAB — CULTURE, BLOOD (SINGLE)

## 2016-01-15 ENCOUNTER — Other Ambulatory Visit: Payer: BLUE CROSS/BLUE SHIELD

## 2016-01-15 ENCOUNTER — Ambulatory Visit: Payer: BLUE CROSS/BLUE SHIELD | Admitting: Hematology and Oncology

## 2016-01-20 NOTE — Assessment & Plan Note (Signed)
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 Genetic counseling 2. Neoadjuvant chemotherapy. (TCH Perjeta 6 cycles followed by Herceptin maintenance for one year) 3. Breast conserving surgery followed by 4. Adjuvant radiation therapy followed by 5. Adjuvant antiestrogen therapy -------------------------------------------------------------------------------------------------------------------------------------------------- Current treatment: TCH Perjeta Echocardiogram 12/26/2015: EF 60-65%  Chemotherapy toxicities: 1. Hospitalization: 01/08/2016 to 01/12/2016 for neutropenic fever (because her insurance company denied Neulasta for cycle 1): We will plan on giving Neulasta with cycle 2 and will get insurance authorization again. Cultures were negative. 2. leg edema: Sent a prescription for Lasix 20 mg daily 01/13/2016 3. Severe anemia: Hemoglobin 8.8: Baseline hemoglobin of 13 g. Related to chemotherapy and hemodilution.  4. Hypokalemia: During to hospitalization where she had diarrhea. Diarrhea has subsided.  Throat inflammation: Improving slowly with time.  Monitoring closely for chemotherapy toxicities  Return to clinic in 3 weeks for cycle 3  

## 2016-01-21 ENCOUNTER — Other Ambulatory Visit (HOSPITAL_BASED_OUTPATIENT_CLINIC_OR_DEPARTMENT_OTHER): Payer: BLUE CROSS/BLUE SHIELD

## 2016-01-21 ENCOUNTER — Ambulatory Visit (HOSPITAL_BASED_OUTPATIENT_CLINIC_OR_DEPARTMENT_OTHER): Payer: BLUE CROSS/BLUE SHIELD

## 2016-01-21 ENCOUNTER — Ambulatory Visit: Payer: BLUE CROSS/BLUE SHIELD

## 2016-01-21 ENCOUNTER — Encounter: Payer: Self-pay | Admitting: Hematology and Oncology

## 2016-01-21 ENCOUNTER — Ambulatory Visit (HOSPITAL_BASED_OUTPATIENT_CLINIC_OR_DEPARTMENT_OTHER): Payer: BLUE CROSS/BLUE SHIELD | Admitting: Hematology and Oncology

## 2016-01-21 ENCOUNTER — Encounter: Payer: Self-pay | Admitting: *Deleted

## 2016-01-21 VITALS — BP 118/79 | HR 82 | Temp 98.2°F

## 2016-01-21 DIAGNOSIS — Z5111 Encounter for antineoplastic chemotherapy: Secondary | ICD-10-CM

## 2016-01-21 DIAGNOSIS — J029 Acute pharyngitis, unspecified: Secondary | ICD-10-CM | POA: Diagnosis not present

## 2016-01-21 DIAGNOSIS — Z170 Estrogen receptor positive status [ER+]: Secondary | ICD-10-CM

## 2016-01-21 DIAGNOSIS — D6481 Anemia due to antineoplastic chemotherapy: Secondary | ICD-10-CM | POA: Diagnosis not present

## 2016-01-21 DIAGNOSIS — C50211 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 female breast: Secondary | ICD-10-CM | POA: Diagnosis not present

## 2016-01-21 DIAGNOSIS — Z5112 Encounter for antineoplastic immunotherapy: Secondary | ICD-10-CM | POA: Diagnosis not present

## 2016-01-21 LAB — CBC WITH DIFFERENTIAL/PLATELET
BASO%: 0.6 % (ref 0.0–2.0)
BASOS ABS: 0 10*3/uL (ref 0.0–0.1)
EOS ABS: 0 10*3/uL (ref 0.0–0.5)
EOS%: 0 % (ref 0.0–7.0)
HEMATOCRIT: 33.2 % — AB (ref 34.8–46.6)
HEMOGLOBIN: 11.3 g/dL — AB (ref 11.6–15.9)
LYMPH%: 30.2 % (ref 14.0–49.7)
MCH: 28.8 pg (ref 25.1–34.0)
MCHC: 34 g/dL (ref 31.5–36.0)
MCV: 84.7 fL (ref 79.5–101.0)
MONO#: 0.5 10*3/uL (ref 0.1–0.9)
MONO%: 10.3 % (ref 0.0–14.0)
NEUT%: 58.9 % (ref 38.4–76.8)
NEUTROS ABS: 2.9 10*3/uL (ref 1.5–6.5)
NRBC: 2 % — AB (ref 0–0)
PLATELETS: 236 10*3/uL (ref 145–400)
RBC: 3.92 10*6/uL (ref 3.70–5.45)
RDW: 14.1 % (ref 11.2–14.5)
WBC: 4.9 10*3/uL (ref 3.9–10.3)
lymph#: 1.5 10*3/uL (ref 0.9–3.3)

## 2016-01-21 LAB — COMPREHENSIVE METABOLIC PANEL
ALBUMIN: 3.7 g/dL (ref 3.5–5.0)
ALK PHOS: 83 U/L (ref 40–150)
ALT: 22 U/L (ref 0–55)
ANION GAP: 9 meq/L (ref 3–11)
AST: 20 U/L (ref 5–34)
BILIRUBIN TOTAL: 0.48 mg/dL (ref 0.20–1.20)
BUN: 17.9 mg/dL (ref 7.0–26.0)
CALCIUM: 9.5 mg/dL (ref 8.4–10.4)
CO2: 24 meq/L (ref 22–29)
CREATININE: 0.7 mg/dL (ref 0.6–1.1)
Chloride: 106 mEq/L (ref 98–109)
EGFR: 86 mL/min/{1.73_m2} — ABNORMAL LOW (ref >90)
Glucose: 99 mg/dl (ref 70–140)
Potassium: 4 mEq/L (ref 3.5–5.1)
Sodium: 138 mEq/L (ref 136–145)
TOTAL PROTEIN: 6.7 g/dL (ref 6.4–8.3)

## 2016-01-21 MED ORDER — SODIUM CHLORIDE 0.9 % IV SOLN: 10.0000 mg | Freq: Once | INTRAVENOUS | Status: DC

## 2016-01-21 MED ORDER — HEPARIN SOD (PORK) LOCK FLUSH 100 UNIT/ML IV SOLN
500.0000 [IU] | Freq: Once | INTRAVENOUS | Status: AC | PRN
  Administered 2016-01-21: 500 [IU]
  Filled 2016-01-21: qty 5

## 2016-01-21 MED ORDER — DIPHENHYDRAMINE HCL 25 MG PO CAPS
ORAL_CAPSULE | ORAL | Status: AC
  Filled 2016-01-21: qty 2

## 2016-01-21 MED ORDER — ACETAMINOPHEN 325 MG PO TABS
650.0000 mg | ORAL_TABLET | Freq: Once | ORAL | Status: AC
  Administered 2016-01-21: 650 mg via ORAL

## 2016-01-21 MED ORDER — PALONOSETRON HCL INJECTION 0.25 MG/5ML
INTRAVENOUS | Status: AC
  Filled 2016-01-21: qty 5

## 2016-01-21 MED ORDER — PEGFILGRASTIM 6 MG/0.6ML ~~LOC~~ PSKT
6.0000 mg | PREFILLED_SYRINGE | Freq: Once | SUBCUTANEOUS | Status: AC
  Administered 2016-01-21: 6 mg via SUBCUTANEOUS
  Filled 2016-01-21: qty 0.6

## 2016-01-21 MED ORDER — SODIUM CHLORIDE 0.9 % IV SOLN
Freq: Once | INTRAVENOUS | Status: AC
  Administered 2016-01-21: 09:00:00 via INTRAVENOUS

## 2016-01-21 MED ORDER — DIPHENHYDRAMINE HCL 25 MG PO CAPS
50.0000 mg | ORAL_CAPSULE | Freq: Once | ORAL | Status: AC
  Administered 2016-01-21: 50 mg via ORAL

## 2016-01-21 MED ORDER — PALONOSETRON HCL INJECTION 0.25 MG/5ML
0.2500 mg | Freq: Once | INTRAVENOUS | Status: AC
  Administered 2016-01-21: 0.25 mg via INTRAVENOUS

## 2016-01-21 MED ORDER — SODIUM CHLORIDE 0.9 % IV SOLN
Freq: Once | INTRAVENOUS | Status: DC
  Filled 2016-01-21: qty 5

## 2016-01-21 MED ORDER — TRASTUZUMAB CHEMO 150 MG IV SOLR
6.0000 mg/kg | Freq: Once | INTRAVENOUS | Status: AC
  Administered 2016-01-21: 441 mg via INTRAVENOUS
  Filled 2016-01-21: qty 21

## 2016-01-21 MED ORDER — SODIUM CHLORIDE 0.9 % IV SOLN
420.0000 mg | Freq: Once | INTRAVENOUS | Status: AC
  Administered 2016-01-21: 420 mg via INTRAVENOUS
  Filled 2016-01-21: qty 14

## 2016-01-21 MED ORDER — ACETAMINOPHEN 325 MG PO TABS
ORAL_TABLET | ORAL | Status: AC
  Filled 2016-01-21: qty 2

## 2016-01-21 MED ORDER — SODIUM CHLORIDE 0.9% FLUSH
10.0000 mL | INTRAVENOUS | Status: DC | PRN
  Administered 2016-01-21: 10 mL
  Filled 2016-01-21: qty 10

## 2016-01-21 MED ORDER — SODIUM CHLORIDE 0.9 % IV SOLN
548.5000 mg | Freq: Once | INTRAVENOUS | Status: AC
  Administered 2016-01-21: 550 mg via INTRAVENOUS
  Filled 2016-01-21: qty 55

## 2016-01-21 MED ORDER — DOCETAXEL CHEMO INJECTION 160 MG/16ML
75.0000 mg/m2 | Freq: Once | INTRAVENOUS | Status: AC
  Administered 2016-01-21: 140 mg via INTRAVENOUS
  Filled 2016-01-21: qty 8

## 2016-01-21 MED ORDER — SODIUM CHLORIDE 0.9 % IV SOLN
Freq: Once | INTRAVENOUS | Status: AC
  Administered 2016-01-21: 13:00:00 via INTRAVENOUS
  Filled 2016-01-21: qty 5

## 2016-01-21 NOTE — Progress Notes (Signed)
Patient Care Team: Kelton Pillar, MD as PCP - General (Family Medicine)  SUMMARY OF ONCOLOGIC HISTORY:   Breast cancer of upper-inner quadrant of right female breast ([place])   12/05/2015 Mammogram    Right breast mass 2:00 4 cm from nipple: 1.7 cm, T1c N0 stage IA; right breast 2:00 6 cm from nipple 1.3 cm mass, 7 mm satellite nodule between the 2 masses, T1 cN0 stage IA      12/06/2015 Initial Diagnosis    Right breast biopsy 2:00 6 cm from nipple: Grade 2-3 invasive ductal cancer, ER 90%, PR 2%, Ki-67 80%, HER-2 positive ratio 2.07; right biopsy 2:00 4 cm from nipple: IDC with DCIS, ER 95%, PR 10%, Ki-67 90%, HER-2 negative ratio 1.27      12/18/2015 Breast MRI    3 enhancing masses in the upper inner quadrant right breast spanning an area 3.7 cm (1.7 cm, 1 cm, 1.4 cm) no lymph node enlargement      12/31/2015 -  Neo-Adjuvant Chemotherapy    TCH Perjeta 6 cycles followed by Herceptin maintenance for 1 year      01/08/2016 - 01/12/2016 Hospital Admission    Neutropenic fever hospitalization (because patient did not receive Neulasta with cycle 1)       CHIEF COMPLIANT: Cycle 2 TCH Perjeta  INTERVAL HISTORY: Patricia Chandler is a 60 year old with above-mentioned history of right breast cancer currently on neoadjuvant chemotherapy and today is cycle 2 of [place]. After cycle 1 she was hospitalized with neutropenic fever and required broad-spectrum antibiotics. She had finally recovered from all of that. For the past week her energy levels have improved. Taste and appetite have also improved and she is eating better. Her sore throat has improved as well.  REVIEW OF SYSTEMS:   Constitutional: Denies fevers, chills or abnormal weight loss Eyes: Denies blurriness of vision Ears, nose, mouth, throat, and face: Denies mucositis or sore throat Respiratory: Denies cough, dyspnea or wheezes Cardiovascular: Denies palpitation, chest discomfort Gastrointestinal:  Denies nausea,  heartburn or change in bowel habits Skin: Denies abnormal skin rashes Lymphatics: Denies new lymphadenopathy or easy bruising Neurological:Denies numbness, tingling or new weaknesses Behavioral/Psych: Mood is stable, no new changes  Extremities: No lower extremity edema Breast: No denies any pain or lumps or nodules in either breasts All other systems were reviewed with the patient and are negative.  I have reviewed the past medical history, past surgical history, social history and family history with the patient and they are unchanged from previous note.  ALLERGIES:  has No Known Allergies.  MEDICATIONS:  Current Outpatient Prescriptions  Medication Sig Dispense Refill  . ALPRAZolam (XANAX) 0.5 MG tablet Take 1 tablet (0.5 mg total) by mouth at bedtime as needed for anxiety. 60 tablet 0  . Ascorbic Acid (VITAMIN C PO) Take 1 tablet by mouth daily.     . calcium carbonate (OS-CAL) 600 MG TABS tablet Take 600 mg by mouth daily.    . Cholecalciferol (VITAMIN D3) 1000 units CAPS Take 4,000 Units by mouth daily.    Marland Kitchen dexamethasone (DECADRON) 4 MG tablet Take 1 tablet (4 mg total) by mouth daily. 1 tablet daily before chemotherapy and 1 tablet day after chemotherapy 15 tablet 0  . furosemide (LASIX) 20 MG tablet Take 1 tablet (20 mg total) by mouth daily. 30 tablet 0  . HYDROcodone-acetaminophen (NORCO) 10-325 MG tablet Take 1 tablet by mouth every 6 (six) hours as needed. 10 tablet 0  . ibuprofen (ADVIL,MOTRIN) 200 MG tablet  Take 400-600 mg by mouth every 6 (six) hours as needed for moderate pain.    Marland Kitchen lidocaine-prilocaine (EMLA) cream Apply to affected area once 30 g 3  . loperamide (IMODIUM) 2 MG capsule Take 2 mg by mouth as needed for diarrhea or loose stools.    . magnesium 30 MG tablet Take 30 mg by mouth daily.     . Multiple Vitamin (MULTIVITAMIN WITH MINERALS) TABS tablet Take 1 tablet by mouth daily.    . Omega-3 Fatty Acids (FISH OIL PO) Take 1 capsule by mouth daily.     .  ondansetron (ZOFRAN) 8 MG tablet Take 1 tablet (8 mg total) by mouth 2 (two) times daily as needed for refractory nausea / vomiting. Start on day 3 after chemo. 30 tablet 1  . pantoprazole (PROTONIX) 40 MG tablet Take 40 mg by mouth 2 (two) times daily.   0  . potassium chloride (K-DUR) 10 MEQ tablet Take 1 tablet (10 mEq total) by mouth daily. 3 tablet 0  . Probiotic Product (PROBIOTIC DAILY PO) Take 1 capsule by mouth daily.     . prochlorperazine (COMPAZINE) 10 MG tablet Take 1 tablet (10 mg total) by mouth every 6 (six) hours as needed (Nausea or vomiting). 30 tablet 1  . saccharomyces boulardii (FLORASTOR) 250 MG capsule Take 1 capsule (250 mg total) by mouth 2 (two) times daily.     No current facility-administered medications for this visit.    Facility-Administered Medications Ordered in Other Visits  Medication Dose Route Frequency Provider Last Rate Last Dose  . 0.9 %  sodium chloride infusion   Intravenous Once Nicholas Lose, MD      . CARBOplatin (PARAPLATIN) 550 mg in sodium chloride 0.9 % 250 mL chemo infusion  550 mg Intravenous Once Nicholas Lose, MD      . DOCEtaxel (TAXOTERE) 140 mg in sodium chloride 0.9 % 250 mL chemo infusion  75 mg/m2 (Treatment Plan Recorded) Intravenous Once Nicholas Lose, MD      . fosaprepitant (EMEND) 150 mg, dexamethasone (DECADRON) 10 mg in sodium chloride 0.9 % 145 mL IVPB   Intravenous Once Nicholas Lose, MD      . heparin lock flush 100 unit/mL  500 Units Intracatheter Once PRN Nicholas Lose, MD      . palonosetron (ALOXI) injection 0.25 mg  0.25 mg Intravenous Once Nicholas Lose, MD      . pegfilgrastim (NEULASTA ONPRO KIT) injection 6 mg  6 mg Subcutaneous Once Nicholas Lose, MD      . pertuzumab (PERJETA) 420 mg in sodium chloride 0.9 % 250 mL chemo infusion  420 mg Intravenous Once Nicholas Lose, MD      . sodium chloride flush (NS) 0.9 % injection 10 mL  10 mL Intracatheter PRN Nicholas Lose, MD        PHYSICAL EXAMINATION: ECOG PERFORMANCE STATUS:  1 - Symptomatic but completely ambulatory  Vitals:   01/21/16 0935  BP: 125/74  Pulse: (!) 106  Temp: 98.1 F (36.7 C)   Filed Weights   01/21/16 0935  Weight: 155 lb 14.4 oz (70.7 kg)    GENERAL:alert, no distress and comfortable SKIN: skin color, texture, turgor are normal, no rashes or significant lesions EYES: normal, Conjunctiva are pink and non-injected, sclera clear OROPHARYNX:no exudate, no erythema and lips, buccal mucosa, and tongue normal  NECK: supple, thyroid normal size, non-tender, without nodularity LYMPH:  no palpable lymphadenopathy in the cervical, axillary or inguinal LUNGS: clear to auscultation and percussion with normal  breathing effort HEART: regular rate & rhythm and no murmurs and no lower extremity edema ABDOMEN:abdomen soft, non-tender and normal bowel sounds MUSCULOSKELETAL:no cyanosis of digits and no clubbing  NEURO: alert & oriented x 3 with fluent speech, no focal motor/sensory deficits EXTREMITIES: No lower extremity edema  LABORATORY DATA:  I have reviewed the data as listed   Chemistry      Component Value Date/Time   NA 138 01/21/2016 0904   K 4.0 01/21/2016 0904   CL 111 01/12/2016 0520   CO2 24 01/21/2016 0904   BUN 17.9 01/21/2016 0904   CREATININE 0.7 01/21/2016 0904      Component Value Date/Time   CALCIUM 9.5 01/21/2016 0904   ALKPHOS 83 01/21/2016 0904   AST 20 01/21/2016 0904   ALT 22 01/21/2016 0904   BILITOT 0.48 01/21/2016 0904       Lab Results  Component Value Date   WBC 4.9 01/21/2016   HGB 11.3 (L) 01/21/2016   HCT 33.2 (L) 01/21/2016   MCV 84.7 01/21/2016   PLT 236 01/21/2016   NEUTROABS 2.9 01/21/2016     ASSESSMENT & PLAN:  Breast cancer of upper-inner quadrant of right female breast ([place]) Right breast biopsy 2:00 6 cm from nipple: Grade 2-3 invasive ductal cancer, ER 90%, PR 2%, Ki-67 80%, HER-2 positive ratio 2.07; right biopsy 2:00 4 cm from nipple: IDC with DCIS, ER 95%, PR 10%, Ki-67 90%, HER-2  negative ratio 1.27 Breast MRI08/30/2017: 3 enhancing masses in the upper inner quadrant right breast spanning an area 3.7 cm (1.7 cm, 1 cm, 1.4 cm) no lymph node enlargement Clinical stage: T2N0 (stage 2A)  Treatment plan: 1. Genetic counseling 2. Neoadjuvant chemotherapy. ([place] Perjeta 6 cycles followed by Herceptin maintenance for one year) 3. Breast conserving surgery followed by 4. Adjuvant radiation therapy followed by 5. Adjuvant antiestrogen therapy -------------------------------------------------------------------------------------------------------------------------------------------------- Current treatment: TCH Perjeta, Today is cycle 2 Echocardiogram 12/26/2015: EF 60-65%  Chemotherapy toxicities: 1. Hospitalization: 01/08/2016 to 01/12/2016 for neutropenic fever (because her insurance company denied Neulasta for cycle 1): We will plan on giving Neulasta with cycle 2 and will get insurance authorization again. Cultures were negative. 2. leg edema: Sent a prescription for Lasix 20 mg daily 01/13/2016, resolved. Discontinue Lasix 3. Severe anemia: Hemoglobin 11.3: Baseline hemoglobin of 13 g. Related to chemotherapy.  4. Hypokalemia: During to hospitalization where she had diarrhea. Diarrhea has subsided. Potassium today is 4  Throat inflammation: Improving slowly with time. Monitoring closely for chemotherapy toxicities Patient is very upset that she had neutropenic fever and sepsis which would have been easily prevented had she received Neulasta injection.  Return to clinic in 1 weeks for toxicity check.  No orders of the defined types were placed in this encounter.  The patient has a good understanding of the overall plan. she agrees with it. she will call with any problems that may develop before the next visit here.   Rulon Eisenmenger, MD 01/21/16

## 2016-01-21 NOTE — Progress Notes (Signed)
Patient reported at 1550 feeling "tired and maybe warmer than before".  No other symptoms relating to adverse rxn - denies CP or SHOB and no rash or fever. VS obtained and charted. Cyndee B NP made aware and we are continuing with her Taxotere (halfway through when began) and pt agrees with family member at bedside and will tell us if anything worsens. She is alert and oriented and on her computer at this time.

## 2016-01-28 ENCOUNTER — Other Ambulatory Visit: Payer: Self-pay | Admitting: *Deleted

## 2016-01-28 ENCOUNTER — Ambulatory Visit: Payer: BLUE CROSS/BLUE SHIELD

## 2016-01-28 ENCOUNTER — Other Ambulatory Visit (HOSPITAL_BASED_OUTPATIENT_CLINIC_OR_DEPARTMENT_OTHER): Payer: BLUE CROSS/BLUE SHIELD

## 2016-01-28 ENCOUNTER — Ambulatory Visit (HOSPITAL_BASED_OUTPATIENT_CLINIC_OR_DEPARTMENT_OTHER): Payer: BLUE CROSS/BLUE SHIELD

## 2016-01-28 ENCOUNTER — Ambulatory Visit (HOSPITAL_BASED_OUTPATIENT_CLINIC_OR_DEPARTMENT_OTHER): Payer: BLUE CROSS/BLUE SHIELD | Admitting: Hematology and Oncology

## 2016-01-28 ENCOUNTER — Encounter: Payer: Self-pay | Admitting: Hematology and Oncology

## 2016-01-28 VITALS — BP 103/63 | HR 88 | Temp 98.4°F | Resp 18

## 2016-01-28 VITALS — BP 110/76 | HR 113 | Temp 98.1°F | Resp 18 | Ht 63.0 in | Wt 148.1 lb

## 2016-01-28 DIAGNOSIS — R7401 Elevation of levels of liver transaminase levels: Secondary | ICD-10-CM

## 2016-01-28 DIAGNOSIS — Z170 Estrogen receptor positive status [ER+]: Secondary | ICD-10-CM

## 2016-01-28 DIAGNOSIS — C50211 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 female breast: Secondary | ICD-10-CM | POA: Diagnosis not present

## 2016-01-28 DIAGNOSIS — R112 Nausea with vomiting, unspecified: Secondary | ICD-10-CM | POA: Insufficient documentation

## 2016-01-28 DIAGNOSIS — E860 Dehydration: Secondary | ICD-10-CM

## 2016-01-28 DIAGNOSIS — G4700 Insomnia, unspecified: Secondary | ICD-10-CM

## 2016-01-28 DIAGNOSIS — T451X5A Adverse effect of antineoplastic and immunosuppressive drugs, initial encounter: Secondary | ICD-10-CM | POA: Insufficient documentation

## 2016-01-28 DIAGNOSIS — K521 Toxic gastroenteritis and colitis: Secondary | ICD-10-CM | POA: Diagnosis not present

## 2016-01-28 DIAGNOSIS — R110 Nausea: Secondary | ICD-10-CM

## 2016-01-28 DIAGNOSIS — K2100 Gastro-esophageal reflux disease with esophagitis, without bleeding: Secondary | ICD-10-CM

## 2016-01-28 DIAGNOSIS — R109 Unspecified abdominal pain: Secondary | ICD-10-CM

## 2016-01-28 DIAGNOSIS — D6481 Anemia due to antineoplastic chemotherapy: Secondary | ICD-10-CM | POA: Diagnosis not present

## 2016-01-28 DIAGNOSIS — Z95828 Presence of other vascular implants and grafts: Secondary | ICD-10-CM

## 2016-01-28 DIAGNOSIS — R740 Nonspecific elevation of levels of transaminase and lactic acid dehydrogenase [LDH]: Secondary | ICD-10-CM

## 2016-01-28 LAB — COMPREHENSIVE METABOLIC PANEL
ALBUMIN: 3.9 g/dL (ref 3.5–5.0)
ALK PHOS: 126 U/L (ref 40–150)
ALT: 68 U/L — ABNORMAL HIGH (ref 0–55)
AST: 45 U/L — AB (ref 5–34)
Anion Gap: 12 mEq/L — ABNORMAL HIGH (ref 3–11)
BILIRUBIN TOTAL: 0.64 mg/dL (ref 0.20–1.20)
BUN: 21.7 mg/dL (ref 7.0–26.0)
CO2: 24 mEq/L (ref 22–29)
CREATININE: 0.8 mg/dL (ref 0.6–1.1)
Calcium: 9.5 mg/dL (ref 8.4–10.4)
Chloride: 98 mEq/L (ref 98–109)
EGFR: 74 mL/min/{1.73_m2} — AB (ref >90)
GLUCOSE: 99 mg/dL (ref 70–140)
Potassium: 3.4 mEq/L — ABNORMAL LOW (ref 3.5–5.1)
SODIUM: 134 meq/L — AB (ref 136–145)
TOTAL PROTEIN: 6.8 g/dL (ref 6.4–8.3)

## 2016-01-28 LAB — CBC WITH DIFFERENTIAL/PLATELET
BASO%: 0.5 % (ref 0.0–2.0)
Basophils Absolute: 0 10*3/uL (ref 0.0–0.1)
EOS ABS: 0 10*3/uL (ref 0.0–0.5)
EOS%: 0.2 % (ref 0.0–7.0)
HCT: 33.2 % — ABNORMAL LOW (ref 34.8–46.6)
HEMOGLOBIN: 11.3 g/dL — AB (ref 11.6–15.9)
LYMPH%: 16.3 % (ref 14.0–49.7)
MCH: 28.4 pg (ref 25.1–34.0)
MCHC: 33.9 g/dL (ref 31.5–36.0)
MCV: 83.7 fL (ref 79.5–101.0)
MONO#: 1.3 10*3/uL — AB (ref 0.1–0.9)
MONO%: 15.1 % — AB (ref 0.0–14.0)
NEUT%: 67.9 % (ref 38.4–76.8)
NEUTROS ABS: 5.9 10*3/uL (ref 1.5–6.5)
Platelets: 195 10*3/uL (ref 145–400)
RBC: 3.97 10*6/uL (ref 3.70–5.45)
RDW: 13.5 % (ref 11.2–14.5)
WBC: 8.7 10*3/uL (ref 3.9–10.3)
lymph#: 1.4 10*3/uL (ref 0.9–3.3)

## 2016-01-28 MED ORDER — SODIUM CHLORIDE 0.9 % IV SOLN
INTRAVENOUS | Status: DC
  Administered 2016-01-28: 16:00:00 via INTRAVENOUS
  Filled 2016-01-28: qty 1000

## 2016-01-28 MED ORDER — ZOLPIDEM TARTRATE 5 MG PO TABS: 5.0000 mg | ORAL_TABLET | Freq: Every evening | ORAL | 0 refills | Status: DC | PRN

## 2016-01-28 MED ORDER — SODIUM CHLORIDE 0.9 % IJ SOLN
10.0000 mL | INTRAMUSCULAR | Status: DC | PRN
  Administered 2016-01-28: 10 mL via INTRAVENOUS
  Filled 2016-01-28: qty 10

## 2016-01-28 MED ORDER — HEPARIN SOD (PORK) LOCK FLUSH 100 UNIT/ML IV SOLN
500.0000 [IU] | Freq: Once | INTRAVENOUS | Status: DC | PRN
  Administered 2016-01-28: 500 [IU] via INTRAVENOUS
  Filled 2016-01-28: qty 5

## 2016-01-28 MED ORDER — DIPHENOXYLATE-ATROPINE 2.5-0.025 MG PO TABS: 1.0000 | ORAL_TABLET | Freq: Four times a day (QID) | ORAL | 2 refills | Status: DC | PRN

## 2016-01-28 MED ORDER — SODIUM CHLORIDE 0.9 % IV SOLN
Freq: Once | INTRAVENOUS | Status: AC
  Administered 2016-01-28: 16:00:00 via INTRAVENOUS
  Filled 2016-01-28: qty 4

## 2016-01-28 MED ORDER — DICYCLOMINE HCL 10 MG PO CAPS: 10.0000 mg | ORAL_CAPSULE | Freq: Three times a day (TID) | ORAL | 1 refills | Status: DC

## 2016-01-28 MED ORDER — SODIUM CHLORIDE 0.9 % IV SOLN
Freq: Once | INTRAVENOUS | Status: AC
  Administered 2016-01-28: 16:00:00 via INTRAVENOUS

## 2016-01-28 NOTE — Patient Instructions (Signed)
Dehydration, Adult Dehydration is a condition in which you do not have enough fluid or water in your body. It happens when you take in less fluid than you lose. Vital organs such as the kidneys, brain, and heart cannot function without a proper amount of fluids. Any loss of fluids from the body can cause dehydration.  Dehydration can range from mild to severe. This condition should be treated right away to help prevent it from becoming severe. CAUSES  This condition may be caused by:  Vomiting.  Diarrhea.  Excessive sweating, such as when exercising in hot or humid weather.  Not drinking enough fluid during strenuous exercise or during an illness.  Excessive urine output.  Fever.  Certain medicines. RISK FACTORS This condition is more likely to develop in:  People who are taking certain medicines that cause the body to lose excess fluid (diuretics).   People who have a chronic illness, such as diabetes, that may increase urination.  Older adults.   People who live at high altitudes.   People who participate in endurance sports.  SYMPTOMS  Mild Dehydration  Thirst.  Dry lips.  Slightly dry mouth.  Dry, warm skin. Moderate Dehydration  Very dry mouth.   Muscle cramps.   Dark urine and decreased urine production.   Decreased tear production.   Headache.   Light-headedness, especially when you stand up from a sitting position.  Severe Dehydration  Changes in skin.   Cold and clammy skin.   Skin does not spring back quickly when lightly pinched and released.   Changes in body fluids.   Extreme thirst.   No tears.   Not able to sweat when body temperature is high, such as in hot weather.   Minimal urine production.   Changes in vital signs.   Rapid, weak pulse (more than 100 beats per minute when you are sitting still).   Rapid breathing.   Low blood pressure.   Other changes.   Sunken eyes.   Cold hands and feet.    Confusion.  Lethargy and difficulty being awakened.  Fainting (syncope).   Short-term weight loss.   Unconsciousness. DIAGNOSIS  This condition may be diagnosed based on your symptoms. You may also have tests to determine how severe your dehydration is. These tests may include:   Urine tests.   Blood tests.  TREATMENT  Treatment for this condition depends on the severity. Mild or moderate dehydration can often be treated at home. Treatment should be started right away. Do not wait until dehydration becomes severe. Severe dehydration needs to be treated at the hospital. Treatment for Mild Dehydration  Drinking plenty of water to replace the fluid you have lost.   Replacing minerals in your blood (electrolytes) that you may have lost.  Treatment for Moderate Dehydration  Consuming oral rehydration solution (ORS). Treatment for Severe Dehydration  Receiving fluid through an IV tube.   Receiving electrolyte solution through a feeding tube that is passed through your nose and into your stomach (nasogastric tube or NG tube).  Correcting any abnormalities in electrolytes. HOME CARE INSTRUCTIONS   Drink enough fluid to keep your urine clear or pale yellow.   Drink water or fluid slowly by taking small sips. You can also try sucking on ice cubes.  Have food or beverages that contain electrolytes. Examples include bananas and sports drinks.  Take over-the-counter and prescription medicines only as told by your health care provider.   Prepare ORS according to the manufacturer's instructions. Take sips   of ORS every 5 minutes until your urine returns to normal.  If you have vomiting or diarrhea, continue to try to drink water, ORS, or both.   If you have diarrhea, avoid:   Beverages that contain caffeine.   Fruit juice.   Milk.   Carbonated soft drinks.  Do not take salt tablets. This can lead to the condition of having too much sodium in your body  (hypernatremia).  SEEK MEDICAL CARE IF:  You cannot eat or drink without vomiting.  You have had moderate diarrhea during a period of more than 24 hours.  You have a fever. SEEK IMMEDIATE MEDICAL CARE IF:   You have extreme thirst.  You have severe diarrhea.  You have not urinated in 6-8 hours, or you have urinated only a small amount of very dark urine.  You have shriveled skin.  You are dizzy, confused, or both.   This information is not intended to replace advice given to you by your health care provider. Make sure you discuss any questions you have with your health care provider.   Document Released: 04/06/2005 Document Revised: 12/26/2014 Document Reviewed: 08/22/2014 Elsevier Interactive Patient Education 2016 Else[employer] Inc[employer]   Hypokalemia Hypokalemia means that the amount of potassium in the blood is lower than normal.Potassium is a chemical, called an electrolyte, that helps regulate the amount of fluid in the body. It also stimulates muscle contraction and helps nerves function properly.Most of the body's potassium is inside of cells, and only a very small amount is in the blood. Because the amount in the blood is so small, minor changes can be life-threatening. CAUSES  Antibiotics.  Diarrhea or vomiting.  Using laxatives too much, which can cause diarrhea.  Chronic kidney disease.  Water pills (diuretics).  Eating disorders (bulimia).  Low magnesium level.  Sweating a lot. SIGNS AND SYMPTOMS  Weakness.  Constipation.  Fatigue.  Muscle cramps.  Mental confusion.  Skipped heartbeats or irregular heartbeat (palpitations).  Tingling or numbness. DIAGNOSIS  Your health care provider can diagnose hypokalemia with blood tests. In addition to checking your potassium level, your health care provider may also check other lab tests. TREATMENT Hypokalemia can be treated with potassium supplements taken by mouth or adjustments in your current medicines.  If your potassium level is very low, you may need to get potassium through a vein (IV) and be monitored in the hospital. A diet high in potassium is also helpful. Foods high in potassium are:  Nuts, such as peanuts and pistachios.  Seeds, such as sunflower seeds and pumpkin seeds.  Peas, lentils, and lima beans.  Whole grain and bran cereals and breads.  Fresh fruit and vegetables, such as apricots, avocado, bananas, cantaloupe, kiwi, oranges, tomatoes, asparagus, and potatoes.  Orange and tomato juices.  Red meats.  Fruit yogurt. HOME CARE INSTRUCTIONS  Take all medicines as prescribed by your health care provider.  Maintain a healthy diet by including nutritious food, such as fruits, vegetables, nuts, whole grains, and lean meats.  If you are taking a laxative, be sure to follow the directions on the label. SEEK MEDICAL CARE IF:  Your weakness gets worse.  You feel your heart pounding or racing.  You are vomiting or having diarrhea.  You are diabetic and having trouble keeping your blood glucose in the normal range. SEEK IMMEDIATE MEDICAL CARE IF:  You have chest pain, shortness of breath, or dizziness.  You are vomiting or having diarrhea for more than 2 days.  You faint. MAKE   SURE YOU:   Understand these instructions.  Will watch your condition.  Will get help right away if you are not doing well or get worse.   This information is not intended to replace advice given to you by your health care provider. Make sure you discuss any questions you have with your health care provider.   Document Released: 04/06/2005 Document Revised: 04/27/2014 Document Reviewed: 10/07/2012 Elsevier Interactive Patient Education 2016 Elsevier Inc.  

## 2016-01-28 NOTE — Assessment & Plan Note (Signed)
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 Genetic counseling: Normal did not reveal any abnormal mutations 2. Neoadjuvant chemotherapy. ([place] Perjeta 6 cycles followed by Herceptin maintenance for one year) 3. Breast conserving surgery followed by 4. Adjuvant radiation therapy followed by 5. Adjuvant antiestrogen therapy -------------------------------------------------------------------------------------------------------------------------------------------------- Current treatment: TCH Perjeta, Today is cycle 2 day 8 Echocardiogram 12/26/2015: EF 60-65%  Chemotherapy toxicities: 1. Hospitalization: 01/08/2016 to 01/12/2016 for neutropenic fever (because her insurance company denied Neulasta for cycle 1): We will plan on giving Neulasta with cycle 2 and will get insurance authorization again. Cultures were negative. 2. leg edema: Sent a prescription for Lasix 20 mg daily 01/13/2016, resolved. Discontinue Lasix 3. Severe anemia: Hemoglobin 11.3: Baseline hemoglobin of 13 g. Related to chemotherapy.  4. Hypokalemia: During to hospitalization where she had diarrhea. Diarrhea has subsided. Potassium today is 4  Throat inflammation: Improving slowly with time. Monitoring closely for chemotherapy toxicities  Return to clinic in 2weeks for cycle 3.

## 2016-01-28 NOTE — Telephone Encounter (Signed)
Rx for Ambien 5mg  called into pt pharmacy

## 2016-01-28 NOTE — Progress Notes (Signed)
Patient Care Team: Kelton Pillar, MD as PCP - General (Family Medicine)  SUMMARY OF ONCOLOGIC HISTORY:   Breast cancer of upper-inner quadrant of right female breast ([place])   12/05/2015 Mammogram    Right breast mass 2:00 4 cm from nipple: 1.7 cm, T1c N0 stage IA; right breast 2:00 6 cm from nipple 1.3 cm mass, 7 mm satellite nodule between the 2 masses, T1 cN0 stage IA      12/06/2015 Initial Diagnosis    Right breast biopsy 2:00 6 cm from nipple: Grade 2-3 invasive ductal cancer, ER 90%, PR 2%, Ki-67 80%, HER-2 positive ratio 2.07; right biopsy 2:00 4 cm from nipple: IDC with DCIS, ER 95%, PR 10%, Ki-67 90%, HER-2 negative ratio 1.27      12/18/2015 Breast MRI    3 enhancing masses in the upper inner quadrant right breast spanning an area 3.7 cm (1.7 cm, 1 cm, 1.4 cm) no lymph node enlargement      12/31/2015 -  Neo-Adjuvant Chemotherapy    TCH Perjeta 6 cycles followed by Herceptin maintenance for 1 year      01/08/2016 - 01/12/2016 Hospital Admission    Neutropenic fever hospitalization (because patient did not receive Neulasta with cycle 1)      01/10/2016 Miscellaneous    Genetic testing was normal did not reveal any mutations       CHIEF COMPLIANT: cycle 2 day 8 TCH P  INTERVAL HISTORY: Patricia Chandler is a 60 year old with above-mentioned history of right breast cancer who is currently on neoadjuvant chemotherapy. Today is cycle 2 day 8. After cycle 1 patient admitted to the hospital with neutropenic fever. She received Neulasta with cycle 2. She is here for toxicity check. Patient has been experiencing profound diarrhea more than 6 loose stools per day in spite of taking 6-8 Imodium [place]. She is also experiencing nausea and vomiting that have been going on for the past 5 days. Because of this she had lost about 13 pounds in the past month. She has been using Zofran and Compazine alternatively but still her nausea appears to be substantial. During chemotherapy she had  received Aloxi Decadron and Emend. She also complains of abdominal cramps that accompany the diarrhea. Her nutrition has not been great. She is trying to keep up on the water intake. Her taste and appetite are really bad.  REVIEW OF SYSTEMS:   Constitutional: Denies fevers, chills or abnormal weight loss, profound fatigue present Eyes: Denies blurriness of vision Ears, nose, mouth, throat, and face: Denies mucositis or sore throat Respiratory: Denies cough, dyspnea or wheezes Cardiovascular: Denies palpitation, chest discomfort Gastrointestinal:  Diarrhea, nausea and vomiting, abdominal cramps Skin: Denies abnormal skin rashes Lymphatics: Denies new lymphadenopathy or easy bruising Neurological:Denies numbness, tingling or new weaknesses Behavioral/Psych: Mood is stable, no new changes  Extremities: No lower extremity edema  All other systems were reviewed with the patient and are negative.  I have reviewed the past medical history, past surgical history, social history and family history with the patient and they are unchanged from previous note.  ALLERGIES:  has No Known Allergies.  MEDICATIONS:  Current Outpatient Prescriptions  Medication Sig Dispense Refill  . ALPRAZolam (XANAX) 0.5 MG tablet Take 1 tablet (0.5 mg total) by mouth at bedtime as needed for anxiety. 60 tablet 0  . Ascorbic Acid (VITAMIN C PO) Take 1 tablet by mouth daily.     . calcium carbonate (OS-CAL) 600 MG TABS tablet Take 600 mg by mouth daily.    Marland Kitchen  Cholecalciferol (VITAMIN D3) 1000 units CAPS Take 4,000 Units by mouth daily.    Marland Kitchen dexamethasone (DECADRON) 4 MG tablet Take 1 tablet (4 mg total) by mouth daily. 1 tablet daily before chemotherapy and 1 tablet day after chemotherapy 15 tablet 0  . dicyclomine (BENTYL) 10 MG capsule Take 1 capsule (10 mg total) by mouth 4 (four) times daily -  before meals and at bedtime. 60 capsule 1  . diphenoxylate-atropine (LOMOTIL) 2.5-0.025 MG tablet Take 1 tablet by mouth 4  (four) times daily as needed for diarrhea or loose stools. 120 tablet 2  . furosemide (LASIX) 20 MG tablet Take 1 tablet (20 mg total) by mouth daily. 30 tablet 0  . HYDROcodone-acetaminophen (NORCO) 10-325 MG tablet Take 1 tablet by mouth every 6 (six) hours as needed. 10 tablet 0  . ibuprofen (ADVIL,MOTRIN) 200 MG tablet Take 400-600 mg by mouth every 6 (six) hours as needed for moderate pain.    Marland Kitchen lidocaine-prilocaine (EMLA) cream Apply to affected area once 30 g 3  . loperamide (IMODIUM) 2 MG capsule Take 2 mg by mouth as needed for diarrhea or loose stools.    . magnesium 30 MG tablet Take 30 mg by mouth daily.     . Multiple Vitamin (MULTIVITAMIN WITH MINERALS) TABS tablet Take 1 tablet by mouth daily.    . Omega-3 Fatty Acids (FISH OIL PO) Take 1 capsule by mouth daily.     . ondansetron (ZOFRAN) 8 MG tablet Take 1 tablet (8 mg total) by mouth 2 (two) times daily as needed for refractory nausea / vomiting. Start on day 3 after chemo. 30 tablet 1  . pantoprazole (PROTONIX) 40 MG tablet Take 40 mg by mouth 2 (two) times daily.   0  . potassium chloride (K-DUR) 10 MEQ tablet Take 1 tablet (10 mEq total) by mouth daily. 3 tablet 0  . Probiotic Product (PROBIOTIC DAILY PO) Take 1 capsule by mouth daily.     . prochlorperazine (COMPAZINE) 10 MG tablet Take 1 tablet (10 mg total) by mouth every 6 (six) hours as needed (Nausea or vomiting). 30 tablet 1  . saccharomyces boulardii (FLORASTOR) 250 MG capsule Take 1 capsule (250 mg total) by mouth 2 (two) times daily.     Current Facility-Administered Medications  Medication Dose Route Frequency Provider Last Rate Last Dose  . sodium chloride 0.9 % 1,000 mL with potassium chloride 20 mEq infusion   Intravenous Continuous Nicholas Lose, MD 500 mL/hr at 01/28/16 1612      PHYSICAL EXAMINATION: ECOG PERFORMANCE STATUS: 2 - Symptomatic, <50% confined to bed  Vitals:   01/28/16 1508  BP: 110/76  Pulse: (!) 113  Resp: 18  Temp: 98.1 F (36.7 C)    Filed Weights   01/28/16 1508  Weight: 148 lb 1.6 oz (67.2 kg)    GENERAL:alert, no distress and comfortable SKIN: skin color, texture, turgor are normal, no rashes or significant lesions EYES: normal, Conjunctiva are pink and non-injected, sclera clear OROPHARYNX:no exudate, no erythema and lips, buccal mucosa, and tongue normal  NECK: supple, thyroid normal size, non-tender, without nodularity LYMPH:  no palpable lymphadenopathy in the cervical, axillary or inguinal LUNGS: clear to auscultation and percussion with normal breathing effort HEART: regular rate & rhythm and no murmurs and no lower extremity edema ABDOMEN:abdomen soft, non-tender and normal bowel sounds MUSCULOSKELETAL:no cyanosis of digits and no clubbing  NEURO: alert & oriented x 3 with fluent speech, no focal motor/sensory deficits EXTREMITIES: No lower extremity edema  LABORATORY  DATA:  I have reviewed the data as listed   Chemistry      Component Value Date/Time   NA 134 (L) 01/28/2016 1428   K 3.4 (L) 01/28/2016 1428   CL 111 01/12/2016 0520   CO2 24 01/28/2016 1428   BUN 21.7 01/28/2016 1428   CREATININE 0.8 01/28/2016 1428      Component Value Date/Time   CALCIUM 9.5 01/28/2016 1428   ALKPHOS 126 01/28/2016 1428   AST 45 (H) 01/28/2016 1428   ALT 68 (H) 01/28/2016 1428   BILITOT 0.64 01/28/2016 1428       Lab Results  Component Value Date   WBC 8.7 01/28/2016   HGB 11.3 (L) 01/28/2016   HCT 33.2 (L) 01/28/2016   MCV 83.7 01/28/2016   PLT 195 01/28/2016   NEUTROABS 5.9 01/28/2016     ASSESSMENT & PLAN:  Breast cancer of upper-inner quadrant of right female breast ([place]) Right breast biopsy 2:00 6 cm from nipple: Grade 2-3 invasive ductal cancer, ER 90%, PR 2%, Ki-67 80%, HER-2 positive ratio 2.07; right biopsy 2:00 4 cm from nipple: IDC with DCIS, ER 95%, PR 10%, Ki-67 90%, HER-2 negative ratio 1.27 Breast MRI08/30/2017: 3 enhancing masses in the upper inner quadrant right breast  spanning an area 3.7 cm (1.7 cm, 1 cm, 1.4 cm) no lymph node enlargement Clinical stage: T2N0 (stage 2A)  Treatment plan: 1. Genetic counseling: Normal did not reveal any abnormal mutations 2. Neoadjuvant chemotherapy. ([place] Perjeta 6 cycles followed by Herceptin maintenance for one year) 3. Breast conserving surgery followed by 4. Adjuvant radiation therapy followed by 5. Adjuvant antiestrogen therapy -------------------------------------------------------------------------------------------------------------------------------------------------- Current treatment: TCH Perjeta, Today is cycle 2 day 8 Echocardiogram 12/26/2015: EF 60-65%  Chemotherapy toxicities: 1. Hospitalization: 01/08/2016 to 01/12/2016 for neutropenic fever (because her insurance company denied Neulasta for cycle 1): We will plan on giving Neulasta with cycle 2 and will get insurance authorization again. Cultures were negative. 2. leg edema: was on Lasix 20 mg daily 01/13/2016, resolved. Discontinued Lasix 3. Severe anemia: Hemoglobin 11.3: Baseline hemoglobin of 13 g. Related to chemotherapy.  4. Hypokalemia: due to persistent diarrhea.  Potassium today is 3.4 5. Chemotherapy-induced diarrhea: I prescribed Lomotil today in addition to Imodium. 6. Chemotherapy-induced abdominal cramps: Bentyl was prescribed. 7. Chemotherapy-induced nausea and vomiting: We will give her IV fluids today along with Zofran. 8. Insomnia: We will discontinue Ativan and prescribe her Ambien CR 5 mg at bedtime.  Monitoring closely for chemotherapy toxicities I instructed the patient to call us if she develops continued problems with dehydration or nausea vomiting so that we may have to give her IV additional IV fluids intermittently in between treatments.  Return to clinic in 2weeks for  cycle 3.   Orders Placed This Encounter  Procedures  . US Abdomen Complete    Standing Status:   Standing    Number of Occurrences:   1    Order  Specific Question:   Symptom/Reason for Exam    Answer:   Elevated alanine aminotransferase (ALT) level [[id number]]   The patient has a good understanding of the overall plan. she agrees with it. she will call with any problems that may develop before the next visit here.   Rulon Eisenmenger, MD 01/28/16

## 2016-01-28 NOTE — Progress Notes (Signed)
Pt discussed insomnia that is not being controlled by xanax at home. Communicated concern with Dr. Lindi Adie who will place an order for home ambien per pt request and need. Pt verbalized understanding. Dr. Geralyn Flash RN to provide further communication to pt.

## 2016-01-28 NOTE — Progress Notes (Signed)
Pt wished to remain accessed. She states she feels severely dehydrated again today and would like IVF. Message left for desk nurse and noted on schedule notes.

## 2016-01-29 ENCOUNTER — Encounter: Payer: Self-pay | Admitting: Pharmacist

## 2016-01-29 NOTE — Progress Notes (Unsigned)
Oral Chemotherapy Pharmacist Encounter  MD given samples of Varubi (rolapitant) for patient with complaints delayed N/V. Varubi (rolapitant) 90mg  tablets x2 dispensed   Johny Drilling, PharmD, BCPS 01/29/2016  3:49 PM Oral Chemotherapy Clinic [phone]

## 2016-02-04 ENCOUNTER — Encounter: Payer: Self-pay | Admitting: General Practice

## 2016-02-04 NOTE — Progress Notes (Signed)
[facility] Spiritual Care Note  Spoke with Patricia Chandler by phone for hourlong appt this morning. Per pt, she is hungry for spiritual growth through the cancer journey.  She used the opportunity well to process her challenges, gratitude, prayer life, and other learning as she begins to cope with dx/tx.  Provided spiritual direction, including empathic listening, pastoral reflection, Scriptural insights per request, and prayer.  We plan to f/u infusion next week, at which time Patricia Chandler desires to schedule another 1:1 appt.  In the meantime, please always page if immediate needs arise.  Thank you.   [place], [place], [facility] Pager [phone] Voicemail [phone]

## 2016-02-11 ENCOUNTER — Encounter: Payer: Self-pay | Admitting: *Deleted

## 2016-02-11 ENCOUNTER — Other Ambulatory Visit (HOSPITAL_BASED_OUTPATIENT_CLINIC_OR_DEPARTMENT_OTHER): Payer: BLUE CROSS/BLUE SHIELD

## 2016-02-11 ENCOUNTER — Ambulatory Visit (HOSPITAL_BASED_OUTPATIENT_CLINIC_OR_DEPARTMENT_OTHER): Payer: BLUE CROSS/BLUE SHIELD

## 2016-02-11 ENCOUNTER — Ambulatory Visit (HOSPITAL_BASED_OUTPATIENT_CLINIC_OR_DEPARTMENT_OTHER): Payer: BLUE CROSS/BLUE SHIELD | Admitting: Hematology and Oncology

## 2016-02-11 ENCOUNTER — Ambulatory Visit: Payer: BLUE CROSS/BLUE SHIELD

## 2016-02-11 ENCOUNTER — Encounter: Payer: Self-pay | Admitting: Hematology and Oncology

## 2016-02-11 DIAGNOSIS — C50211 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 female breast: Secondary | ICD-10-CM | POA: Diagnosis not present

## 2016-02-11 DIAGNOSIS — Z95828 Presence of other vascular implants and grafts: Secondary | ICD-10-CM

## 2016-02-11 DIAGNOSIS — Z5112 Encounter for antineoplastic immunotherapy: Secondary | ICD-10-CM

## 2016-02-11 DIAGNOSIS — Z5111 Encounter for antineoplastic chemotherapy: Secondary | ICD-10-CM

## 2016-02-11 DIAGNOSIS — G4700 Insomnia, unspecified: Secondary | ICD-10-CM | POA: Diagnosis not present

## 2016-02-11 DIAGNOSIS — D6481 Anemia due to antineoplastic chemotherapy: Secondary | ICD-10-CM

## 2016-02-11 DIAGNOSIS — Z170 Estrogen receptor positive status [ER+]: Secondary | ICD-10-CM

## 2016-02-11 LAB — COMPREHENSIVE METABOLIC PANEL
ALT: 28 U/L (ref 0–55)
AST: 25 U/L (ref 5–34)
Albumin: 3.4 g/dL — ABNORMAL LOW (ref 3.5–5.0)
Alkaline Phosphatase: 60 U/L (ref 40–150)
Anion Gap: 9 mEq/L (ref 3–11)
BILIRUBIN TOTAL: 0.52 mg/dL (ref 0.20–1.20)
BUN: 14.6 mg/dL (ref 7.0–26.0)
CALCIUM: 9.1 mg/dL (ref 8.4–10.4)
CHLORIDE: 108 meq/L (ref 98–109)
CO2: 25 meq/L (ref 22–29)
CREATININE: 0.7 mg/dL (ref 0.6–1.1)
EGFR: >90 mL/min/{1.73_m2} (ref >90)
Glucose: 79 mg/dl (ref 70–140)
Potassium: 3.5 mEq/L (ref 3.5–5.1)
Sodium: 142 mEq/L (ref 136–145)
TOTAL PROTEIN: 5.7 g/dL — AB (ref 6.4–8.3)

## 2016-02-11 LAB — CBC WITH DIFFERENTIAL/PLATELET
BASO%: 1 % (ref 0.0–2.0)
Basophils Absolute: 0 10*3/uL (ref 0.0–0.1)
EOS%: 0.4 % (ref 0.0–7.0)
Eosinophils Absolute: 0 10*3/uL (ref 0.0–0.5)
HEMATOCRIT: 27.6 % — AB (ref 34.8–46.6)
HGB: 9.2 g/dL — ABNORMAL LOW (ref 11.6–15.9)
LYMPH#: 1.3 10*3/uL (ref 0.9–3.3)
LYMPH%: 34.8 % (ref 14.0–49.7)
MCH: 29 pg (ref 25.1–34.0)
MCHC: 33.5 g/dL (ref 31.5–36.0)
MCV: 86.6 fL (ref 79.5–101.0)
MONO#: 0.5 10*3/uL (ref 0.1–0.9)
MONO%: 13.2 % (ref 0.0–14.0)
NEUT%: 50.6 % (ref 38.4–76.8)
NEUTROS ABS: 1.9 10*3/uL (ref 1.5–6.5)
Platelets: 204 10*3/uL (ref 145–400)
RBC: 3.18 10*6/uL — AB (ref 3.70–5.45)
RDW: 16.1 % — ABNORMAL HIGH (ref 11.2–14.5)
WBC: 3.8 10*3/uL — AB (ref 3.9–10.3)

## 2016-02-11 MED ORDER — ACETAMINOPHEN 325 MG PO TABS
650.0000 mg | ORAL_TABLET | Freq: Once | ORAL | Status: AC
  Administered 2016-02-11: 650 mg via ORAL

## 2016-02-11 MED ORDER — ACETAMINOPHEN 325 MG PO TABS
ORAL_TABLET | ORAL | Status: AC
  Filled 2016-02-11: qty 2

## 2016-02-11 MED ORDER — DIPHENHYDRAMINE HCL 25 MG PO CAPS
25.0000 mg | ORAL_CAPSULE | Freq: Once | ORAL | Status: AC
Start: 2016-02-11 — End: 2016-02-11
  Administered 2016-02-11: 25 mg via ORAL

## 2016-02-11 MED ORDER — SODIUM CHLORIDE 0.9 % IV SOLN
Freq: Once | INTRAVENOUS | Status: AC
  Administered 2016-02-11: 11:00:00 via INTRAVENOUS

## 2016-02-11 MED ORDER — PEGFILGRASTIM 6 MG/0.6ML ~~LOC~~ PSKT
6.0000 mg | PREFILLED_SYRINGE | Freq: Once | SUBCUTANEOUS | Status: AC
  Administered 2016-02-11: 6 mg via SUBCUTANEOUS
  Filled 2016-02-11: qty 0.6

## 2016-02-11 MED ORDER — HEPARIN SOD (PORK) LOCK FLUSH 100 UNIT/ML IV SOLN
500.0000 [IU] | Freq: Once | INTRAVENOUS | Status: AC | PRN
  Administered 2016-02-11: 500 [IU]
  Filled 2016-02-11: qty 5

## 2016-02-11 MED ORDER — SODIUM CHLORIDE 0.9 % IV SOLN
Freq: Once | INTRAVENOUS | Status: AC
  Administered 2016-02-11: 11:00:00 via INTRAVENOUS
  Filled 2016-02-11: qty 5

## 2016-02-11 MED ORDER — SODIUM CHLORIDE 0.9% FLUSH
10.0000 mL | INTRAVENOUS | Status: DC | PRN
  Administered 2016-02-11: 10 mL
  Filled 2016-02-11: qty 10

## 2016-02-11 MED ORDER — DIPHENHYDRAMINE HCL 25 MG PO CAPS
ORAL_CAPSULE | ORAL | Status: AC
  Filled 2016-02-11: qty 2

## 2016-02-11 MED ORDER — PALONOSETRON HCL INJECTION 0.25 MG/5ML
0.2500 mg | Freq: Once | INTRAVENOUS | Status: AC
  Administered 2016-02-11: 0.25 mg via INTRAVENOUS

## 2016-02-11 MED ORDER — SODIUM CHLORIDE 0.9 % IV SOLN: Freq: Once | INTRAVENOUS | Status: DC

## 2016-02-11 MED ORDER — SODIUM CHLORIDE 0.9% FLUSH
10.0000 mL | INTRAVENOUS | Status: DC | PRN
  Filled 2016-02-11: qty 10

## 2016-02-11 MED ORDER — SODIUM CHLORIDE 0.9 % IV SOLN
440.0000 mg | Freq: Once | INTRAVENOUS | Status: AC
  Administered 2016-02-11: 440 mg via INTRAVENOUS
  Filled 2016-02-11: qty 44

## 2016-02-11 MED ORDER — SODIUM CHLORIDE 0.9 % IJ SOLN
10.0000 mL | INTRAMUSCULAR | Status: DC | PRN
  Administered 2016-02-11: 10 mL via INTRAVENOUS
  Filled 2016-02-11: qty 10

## 2016-02-11 MED ORDER — SODIUM CHLORIDE 0.9 % IV SOLN
420.0000 mg | Freq: Once | INTRAVENOUS | Status: AC
  Administered 2016-02-11: 420 mg via INTRAVENOUS
  Filled 2016-02-11: qty 14

## 2016-02-11 MED ORDER — SODIUM CHLORIDE 0.9 % IV SOLN
65.0000 mg/m2 | Freq: Once | INTRAVENOUS | Status: AC
  Administered 2016-02-11: 120 mg via INTRAVENOUS
  Filled 2016-02-11: qty 12

## 2016-02-11 MED ORDER — TRASTUZUMAB CHEMO 150 MG IV SOLR
6.0000 mg/kg | Freq: Once | INTRAVENOUS | Status: AC
  Administered 2016-02-11: 441 mg via INTRAVENOUS
  Filled 2016-02-11: qty 21

## 2016-02-11 MED ORDER — PALONOSETRON HCL INJECTION 0.25 MG/5ML
INTRAVENOUS | Status: AC
  Filled 2016-02-11: qty 5

## 2016-02-11 NOTE — Patient Instructions (Signed)
[facility] Discharge Instructions for Patients Receiving Chemotherapy  Today you received the following chemotherapy agents :  Herceptin, Perjeta, Taxotere, Carboplatin.  To help prevent nausea and vomiting after your treatment, we encourage you to take your nausea medication as prescribed.   If you develop nausea and vomiting that is not controlled by your nausea medication, call the clinic.   BELOW ARE SYMPTOMS THAT SHOULD BE REPORTED IMMEDIATELY:  *FEVER GREATER THAN 100.5 F  *CHILLS WITH OR WITHOUT FEVER  NAUSEA AND VOMITING THAT IS NOT CONTROLLED WITH YOUR NAUSEA MEDICATION  *UNUSUAL SHORTNESS OF BREATH  *UNUSUAL BRUISING OR BLEEDING  TENDERNESS IN MOUTH AND THROAT WITH OR WITHOUT PRESENCE OF ULCERS  *URINARY PROBLEMS  *BOWEL PROBLEMS  UNUSUAL RASH Items with * indicate a potential emergency and should be followed up as soon as possible.  Feel free to call the clinic you have any questions or concerns. The clinic phone number is [phone].  Please show the CHEMO ALERT CARD at check-in to the Emergency Department and triage nurse.   

## 2016-02-11 NOTE — Assessment & Plan Note (Signed)
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 Genetic counseling: Normal did not reveal any abnormal mutations 2. Neoadjuvant chemotherapy. ([place] Perjeta 6 cycles followed by Herceptin maintenance for one year) 3. Breast conserving surgery followed by 4. Adjuvant radiation therapy followed by 5. Adjuvant antiestrogen therapy -------------------------------------------------------------------------------------------------------------------------------------------------- Current treatment: TCH Perjeta, Today is cycle 3 day 1 Echocardiogram 12/26/2015: EF 60-65%  Chemotherapy toxicities: 1. Hospitalization: 01/08/2016 to 01/12/2016 for neutropenic fever (because her insurance company denied Neulasta for cycle 1): We will plan on giving Neulasta with cycle 2 and will get insurance authorization again. Cultures were negative. 2. leg edema: was on Lasix 20 mg daily 01/13/2016, resolved. Discontinued Lasix 3. Severe anemia: Hemoglobin 11.3: Baseline hemoglobin of 13 g. Related to chemotherapy.  4. Hypokalemia: due to persistent diarrhea.  Potassium today is 3.4 5. Chemotherapy-induced diarrhea: I prescribed Lomotil today in addition to Imodium. 6. Chemotherapy-induced abdominal cramps: Bentyl was prescribed. 7. Chemotherapy-induced nausea and vomiting: We will give her IV fluids today along with Zofran. 8. Insomnia: We discontinued Ativan and prescribed her Ambien CR 5 mg at bedtime.  Monitoring closely for chemotherapy toxicities I instructed the patient to call us if she develops continued problems with  dehydration or nausea vomiting so that we may have to give her IV additional IV fluids intermittently in between treatments.  Return to clinic in 3weeksfor  cycle 4.

## 2016-02-11 NOTE — Progress Notes (Signed)
Patient Care Team: Kelton Pillar, MD as PCP - General (Family Medicine)  DIAGNOSIS:  Encounter Diagnosis  Name Primary?  . 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 breast in female, estrogen receptor positive ([place])     SUMMARY OF ONCOLOGIC HISTORY:   Breast cancer of upper-inner quadrant of right female breast ([place])   12/05/2015 Mammogram    Right breast mass 2:00 4 cm from nipple: 1.7 cm, T1c N0 stage IA; right breast 2:00 6 cm from nipple 1.3 cm mass, 7 mm satellite nodule between the 2 masses, T1 cN0 stage IA      12/06/2015 Initial Diagnosis    Right breast biopsy 2:00 6 cm from nipple: Grade 2-3 invasive ductal cancer, ER 90%, PR 2%, Ki-67 80%, HER-2 positive ratio 2.07; right biopsy 2:00 4 cm from nipple: IDC with DCIS, ER 95%, PR 10%, Ki-67 90%, HER-2 negative ratio 1.27      12/18/2015 Breast MRI    3 enhancing masses in the upper inner quadrant right breast spanning an area 3.7 cm (1.7 cm, 1 cm, 1.4 cm) no lymph node enlargement      12/31/2015 -  Neo-Adjuvant Chemotherapy    TCH Perjeta 6 cycles followed by Herceptin maintenance for 1 year      01/08/2016 - 01/12/2016 Hospital Admission    Neutropenic fever hospitalization (because patient did not receive Neulasta with cycle 1)      01/10/2016 Miscellaneous    Genetic testing was normal did not reveal any mutations       CHIEF COMPLIANT: cycle 3 TCH Perjeta  INTERVAL HISTORY: Patricia Chandler is a 60 year old with above-mentioned history of right breast cancer currently on neoadjuvant chemotherapy. Today is cycle 3 of [place] Perjeta. After the last cycle patient had profound dehydration and frequent IV fluids. Today she feels a lot better. She did not have any further problems with nausea or vomiting or dizziness. She no longer had any neutropenic fever issues.  REVIEW OF SYSTEMS:   Constitutional: Denies fevers, chills or abnormal weight loss Eyes: Denies blurriness of vision Ears, nose, mouth,  throat, and face: Denies mucositis or sore throat Respiratory: Denies cough, dyspnea or wheezes Cardiovascular: Denies palpitation, chest discomfort Gastrointestinal:  Denies nausea, heartburn or change in bowel habits Skin: Denies abnormal skin rashes Lymphatics: Denies new lymphadenopathy or easy bruising Neurological:Denies numbness, tingling or new weaknesses Behavioral/Psych: Mood is stable, no new changes  Extremities: No lower extremity edema All other systems were reviewed with the patient and are negative.  I have reviewed the past medical history, past surgical history, social history and family history with the patient and they are unchanged from previous note.  ALLERGIES:  has No Known Allergies.  MEDICATIONS:  Current Outpatient Prescriptions  Medication Sig Dispense Refill  . ALPRAZolam (XANAX) 0.5 MG tablet Take 1 tablet (0.5 mg total) by mouth at bedtime as needed for anxiety. 60 tablet 0  . Ascorbic Acid (VITAMIN C PO) Take 1 tablet by mouth daily.     . calcium carbonate (OS-CAL) 600 MG TABS tablet Take 600 mg by mouth daily.    . Cholecalciferol (VITAMIN D3) 1000 units CAPS Take 4,000 Units by mouth daily.    Marland Kitchen dexamethasone (DECADRON) 4 MG tablet Take 1 tablet (4 mg total) by mouth daily. 1 tablet daily before chemotherapy and 1 tablet day after chemotherapy 15 tablet 0  . dicyclomine (BENTYL) 10 MG capsule Take 1 capsule (10 mg total) by mouth 4 (four) times daily -  before meals  and at bedtime. 60 capsule 1  . diphenoxylate-atropine (LOMOTIL) 2.5-0.025 MG tablet Take 1 tablet by mouth 4 (four) times daily as needed for diarrhea or loose stools. 120 tablet 2  . furosemide (LASIX) 20 MG tablet Take 1 tablet (20 mg total) by mouth daily. 30 tablet 0  . HYDROcodone-acetaminophen (NORCO) 10-325 MG tablet Take 1 tablet by mouth every 6 (six) hours as needed. 10 tablet 0  . ibuprofen (ADVIL,MOTRIN) 200 MG tablet Take 400-600 mg by mouth every 6 (six) hours as needed for  moderate pain.    Marland Kitchen lidocaine-prilocaine (EMLA) cream Apply to affected area once 30 g 3  . loperamide (IMODIUM) 2 MG capsule Take 2 mg by mouth as needed for diarrhea or loose stools.    . magnesium 30 MG tablet Take 30 mg by mouth daily.     . Multiple Vitamin (MULTIVITAMIN WITH MINERALS) TABS tablet Take 1 tablet by mouth daily.    . Omega-3 Fatty Acids (FISH OIL PO) Take 1 capsule by mouth daily.     . ondansetron (ZOFRAN) 8 MG tablet Take 1 tablet (8 mg total) by mouth 2 (two) times daily as needed for refractory nausea / vomiting. Start on day 3 after chemo. 30 tablet 1  . pantoprazole (PROTONIX) 40 MG tablet Take 40 mg by mouth 2 (two) times daily.   0  . potassium chloride (K-DUR) 10 MEQ tablet Take 1 tablet (10 mEq total) by mouth daily. 3 tablet 0  . Probiotic Product (PROBIOTIC DAILY PO) Take 1 capsule by mouth daily.     . prochlorperazine (COMPAZINE) 10 MG tablet Take 1 tablet (10 mg total) by mouth every 6 (six) hours as needed (Nausea or vomiting). 30 tablet 1  . saccharomyces boulardii (FLORASTOR) 250 MG capsule Take 1 capsule (250 mg total) by mouth 2 (two) times daily.    Marland Kitchen zolpidem (AMBIEN) 5 MG tablet Take 1 tablet (5 mg total) by mouth at bedtime as needed for sleep. 30 tablet 0   No current facility-administered medications for this visit.     PHYSICAL EXAMINATION: ECOG PERFORMANCE STATUS: 1 - Symptomatic but completely ambulatory  Vitals:   02/11/16 0940  BP: (!) 142/76  Pulse: 88  Resp: 18  Temp: 98.3 F (36.8 C)   Filed Weights   02/11/16 0940  Weight: 151 lb 14.4 oz (68.9 kg)    GENERAL:alert, no distress and comfortable SKIN: skin color, texture, turgor are normal, no rashes or significant lesions EYES: normal, Conjunctiva are pink and non-injected, sclera clear OROPHARYNX:no exudate, no erythema and lips, buccal mucosa, and tongue normal  NECK: supple, thyroid normal size, non-tender, without nodularity LYMPH:  no palpable lymphadenopathy in the  cervical, axillary or inguinal LUNGS: clear to auscultation and percussion with normal breathing effort HEART: regular rate & rhythm and no murmurs and no lower extremity edema ABDOMEN:abdomen soft, non-tender and normal bowel sounds MUSCULOSKELETAL:no cyanosis of digits and no clubbing  NEURO: alert & oriented x 3 with fluent speech, no focal motor/sensory deficits EXTREMITIES: No lower extremity edema  LABORATORY DATA:  I have reviewed the data as listed   Chemistry      Component Value Date/Time   NA 142 02/11/2016 0910   K 3.5 02/11/2016 0910   CL 111 01/12/2016 0520   CO2 25 02/11/2016 0910   BUN 14.6 02/11/2016 0910   CREATININE 0.7 02/11/2016 0910      Component Value Date/Time   CALCIUM 9.1 02/11/2016 0910   ALKPHOS 60 02/11/2016 0910  AST 25 02/11/2016 0910   ALT 28 02/11/2016 0910   BILITOT 0.52 02/11/2016 0910       Lab Results  Component Value Date   WBC 3.8 (L) 02/11/2016   HGB 9.2 (L) 02/11/2016   HCT 27.6 (L) 02/11/2016   MCV 86.6 02/11/2016   PLT 204 02/11/2016   NEUTROABS 1.9 02/11/2016     ASSESSMENT & PLAN:  Breast cancer of upper-inner quadrant of right female breast (HCC) Right breast biopsy 2:00 6 cm from nipple: Grade 2-3 invasive ductal cancer, ER 90%, PR 2%, Ki-67 80%, HER-2 positive ratio 2.07; right biopsy 2:00 4 cm from nipple: IDC with DCIS, ER 95%, PR 10%, Ki-67 90%, HER-2 negative ratio 1.27 Breast MRI08/30/2017: 3 enhancing masses in the upper inner quadrant right breast spanning an area 3.7 cm (1.7 cm, 1 cm, 1.4 cm) no lymph node enlargement Clinical stage: T2N0 (stage 2A)  Treatment plan: 1. Genetic counseling: Normal did not reveal any abnormal mutations 2. Neoadjuvant chemotherapy. ([place] Perjeta 6 cycles followed by Herceptin maintenance for one year) 3. Breast conserving surgery followed by 4. Adjuvant radiation therapy followed by 5. Adjuvant antiestrogen  therapy -------------------------------------------------------------------------------------------------------------------------------------------------- Current treatment: TCH Perjeta, Today is cycle 3 day 1 Echocardiogram 12/26/2015: EF 60-65%  Chemotherapy toxicities: 1. Hospitalization: 01/08/2016 to 01/12/2016 for neutropenic fever (because her insurance company denied Neulasta for cycle 1): We will plan on giving Neulasta with cycle 2 and will get insurance authorization again. Cultures were negative. 2. leg edema: was on Lasix 20 mg daily 01/13/2016, resolved. Discontinued Lasix 3. Severe anemia: Hemoglobin 9.2: Baseline hemoglobin of 13 g. Related to chemotherapy.  4. Hypokalemia: due to persistent diarrhea.  Potassium today is 3.5 5. Chemotherapy-induced diarrhea: On Lomotil in addition to Imodium. 6. Chemotherapy-induced abdominal cramps: Bentyl has helped tremendously. 7. Chemotherapy-induced nausea and vomiting:  8. Insomnia: We discontinued Ativan and prescribed her Ambien CR 5 mg at bedtime.  Monitoring closely for chemotherapy toxicities I instructed the patient to call us if she develops continued problems with dehydration or nausea vomiting so that we may have to give her IV additional IV fluids intermittently in between treatments.  Return to clinic in 3weeksfor  cycle  4.   No orders of the defined types were placed in this encounter.  The patient has a good understanding of the overall plan. she agrees with it. she will call with any problems that may develop before the next visit here.   Rulon Eisenmenger, MD 02/11/16

## 2016-02-12 ENCOUNTER — Encounter: Payer: Self-pay | Admitting: General Practice

## 2016-02-12 NOTE — Progress Notes (Signed)
[facility] Spiritual Care Note  Followed up with Patricia Chandler and husband Patricia Chandler for ca 1 hour in infusion yesterday.  Patricia Chandler was in good spirits, hoping that this modified chemo tx would be gentler than they last two she had.  She continues to focus on her theme of spiritual discovery and gratitude (specifically, practicing "seeing through the eyes of a child").  She and Patricia Chandler processed how her outlook has changed (become more positive) since dx, and how that change influences their relationship.  Per request, brought a packet of spiritual reflection activities, including duplicate copies for Patricia Chandler so they might share their experiences and thoughts with one another.  Couple verbalized gratitude for creative support.  Patricia Chandler plans to call to schedule f/u appt when she feels well enough between treatments, but please also page if immediate needs arise.  Thank you.   [employer], [place], [facility] Pager [phone] Voicemail [phone]

## 2016-02-14 MED FILL — PANTOPRAZOLE SOD DR 40 MG T: 40 | 30 days supply | Qty: 60 | Fill #0

## 2016-02-18 ENCOUNTER — Encounter: Payer: Self-pay | Admitting: *Deleted

## 2016-02-18 ENCOUNTER — Other Ambulatory Visit (HOSPITAL_BASED_OUTPATIENT_CLINIC_OR_DEPARTMENT_OTHER): Payer: BLUE CROSS/BLUE SHIELD

## 2016-02-18 ENCOUNTER — Ambulatory Visit (HOSPITAL_BASED_OUTPATIENT_CLINIC_OR_DEPARTMENT_OTHER): Payer: BLUE CROSS/BLUE SHIELD | Admitting: Hematology and Oncology

## 2016-02-18 ENCOUNTER — Ambulatory Visit: Payer: BLUE CROSS/BLUE SHIELD

## 2016-02-18 ENCOUNTER — Encounter: Payer: Self-pay | Admitting: Hematology and Oncology

## 2016-02-18 VITALS — BP 126/89 | HR 111 | Temp 98.3°F | Resp 18 | Wt 144.9 lb

## 2016-02-18 DIAGNOSIS — Z170 Estrogen receptor positive status [ER+]: Secondary | ICD-10-CM

## 2016-02-18 DIAGNOSIS — D6481 Anemia due to antineoplastic chemotherapy: Secondary | ICD-10-CM

## 2016-02-18 DIAGNOSIS — Z95828 Presence of other vascular implants and grafts: Secondary | ICD-10-CM

## 2016-02-18 DIAGNOSIS — C50211 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 female breast: Secondary | ICD-10-CM

## 2016-02-18 DIAGNOSIS — R109 Unspecified abdominal pain: Secondary | ICD-10-CM | POA: Diagnosis not present

## 2016-02-18 DIAGNOSIS — Z23 Encounter for immunization: Secondary | ICD-10-CM | POA: Diagnosis not present

## 2016-02-18 DIAGNOSIS — G4700 Insomnia, unspecified: Secondary | ICD-10-CM

## 2016-02-18 DIAGNOSIS — E876 Hypokalemia: Secondary | ICD-10-CM

## 2016-02-18 LAB — COMPREHENSIVE METABOLIC PANEL
ALBUMIN: 3.9 g/dL (ref 3.5–5.0)
ALK PHOS: 126 U/L (ref 40–150)
ALT: 40 U/L (ref 0–55)
AST: 32 U/L (ref 5–34)
Anion Gap: 10 mEq/L (ref 3–11)
BILIRUBIN TOTAL: 0.35 mg/dL (ref 0.20–1.20)
BUN: 11.4 mg/dL (ref 7.0–26.0)
CALCIUM: 9.5 mg/dL (ref 8.4–10.4)
CO2: 26 mEq/L (ref 22–29)
Chloride: 101 mEq/L (ref 98–109)
Creatinine: 0.8 mg/dL (ref 0.6–1.1)
EGFR: 75 mL/min/{1.73_m2} — ABNORMAL LOW (ref >90)
GLUCOSE: 111 mg/dL (ref 70–140)
Potassium: 3.3 mEq/L — ABNORMAL LOW (ref 3.5–5.1)
SODIUM: 138 meq/L (ref 136–145)
TOTAL PROTEIN: 6.6 g/dL (ref 6.4–8.3)

## 2016-02-18 LAB — CBC WITH DIFFERENTIAL/PLATELET
BASO%: 0.4 % (ref 0.0–2.0)
Basophils Absolute: 0.1 10*3/uL (ref 0.0–0.1)
EOS ABS: 0 10*3/uL (ref 0.0–0.5)
EOS%: 0.2 % (ref 0.0–7.0)
HEMATOCRIT: 29.8 % — AB (ref 34.8–46.6)
HEMOGLOBIN: 10 g/dL — AB (ref 11.6–15.9)
LYMPH#: 2.4 10*3/uL (ref 0.9–3.3)
LYMPH%: 14.2 % (ref 14.0–49.7)
MCH: 29.2 pg (ref 25.1–34.0)
MCHC: 33.6 g/dL (ref 31.5–36.0)
MCV: 86.8 fL (ref 79.5–101.0)
MONO#: 1.6 10*3/uL — ABNORMAL HIGH (ref 0.1–0.9)
MONO%: 9.5 % (ref 0.0–14.0)
NEUT%: 75.7 % (ref 38.4–76.8)
NEUTROS ABS: 13 10*3/uL — AB (ref 1.5–6.5)
Platelets: 231 10*3/uL (ref 145–400)
RBC: 3.43 10*6/uL — ABNORMAL LOW (ref 3.70–5.45)
RDW: 16.8 % — AB (ref 11.2–14.5)
WBC: 17.2 10*3/uL — AB (ref 3.9–10.3)

## 2016-02-18 MED ORDER — INFLUENZA VAC SPLIT QUAD 0.5 ML IM SUSY
0.5000 mL | PREFILLED_SYRINGE | Freq: Once | INTRAMUSCULAR | Status: AC
  Administered 2016-02-18: 0.5 mL via INTRAMUSCULAR
  Filled 2016-02-18: qty 0.5

## 2016-02-18 MED ORDER — HEPARIN SOD (PORK) LOCK FLUSH 100 UNIT/ML IV SOLN
500.0000 [IU] | Freq: Once | INTRAVENOUS | Status: AC | PRN
  Administered 2016-02-18: 500 [IU] via INTRAVENOUS
  Filled 2016-02-18: qty 5

## 2016-02-18 MED ORDER — SODIUM CHLORIDE 0.9 % IJ SOLN
10.0000 mL | INTRAMUSCULAR | Status: DC | PRN
  Administered 2016-02-18: 10 mL via INTRAVENOUS
  Filled 2016-02-18: qty 10

## 2016-02-18 NOTE — Progress Notes (Signed)
Patient Care Team: Kelton Pillar, MD as PCP - General (Family Medicine)  DIAGNOSIS:  Encounter Diagnosis  Name Primary?  . 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 breast in female, estrogen receptor positive ([place])     SUMMARY OF ONCOLOGIC HISTORY:   Breast cancer of upper-inner quadrant of right female breast ([place])   12/05/2015 Mammogram    Right breast mass 2:00 4 cm from nipple: 1.7 cm, T1c N0 stage IA; right breast 2:00 6 cm from nipple 1.3 cm mass, 7 mm satellite nodule between the 2 masses, T1 cN0 stage IA      12/06/2015 Initial Diagnosis    Right breast biopsy 2:00 6 cm from nipple: Grade 2-3 invasive ductal cancer, ER 90%, PR 2%, Ki-67 80%, HER-2 positive ratio 2.07; right biopsy 2:00 4 cm from nipple: IDC with DCIS, ER 95%, PR 10%, Ki-67 90%, HER-2 negative ratio 1.27      12/18/2015 Breast MRI    3 enhancing masses in the upper inner quadrant right breast spanning an area 3.7 cm (1.7 cm, 1 cm, 1.4 cm) no lymph node enlargement      12/31/2015 -  Neo-Adjuvant Chemotherapy    TCH Perjeta 6 cycles followed by Herceptin maintenance for 1 year      01/08/2016 - 01/12/2016 Hospital Admission    Neutropenic fever hospitalization (because patient did not receive Neulasta with cycle 1)      01/10/2016 Miscellaneous    Genetic testing was normal did not reveal any mutations       CHIEF COMPLIANT: Cycle 3 day 8 chemotherapy with [facility] Perjeta  INTERVAL HISTORY: MAHATHI POKORNEY is a 60 year old with above-mentioned history of right breast cancer currently on neoadjuvant chemotherapy with [place] Perjeta. After cycle 1 she was hospitalized for neutropenic fever because her insurance did not authorize Neulasta. She is currently receiving Neulasta with cycle 2 onwards. Chemotherapy has been causing her diarrhea and dehydration abdominal cramps. After using the dosage of last chemotherapy, she no longer had any diarrhea. She still takes Bentyl for the abdominal cramps which  is helping her. She did not have any nausea with the last cycle of chemotherapy. Difficulty with sleeping is being managed with Ambien CR 5 mg at bedtime.   REVIEW OF SYSTEMS:   Constitutional: Denies fevers, chills or abnormal weight loss, fatigue Eyes: Denies blurriness of vision Ears, nose, mouth, throat, and face: Denies mucositis or sore throat Respiratory: Denies cough, dyspnea or wheezes Cardiovascular: Denies palpitation, chest discomfort Gastrointestinal:  Nausea and diarrhea Skin: Denies abnormal skin rashes Lymphatics: Denies new lymphadenopathy or easy bruising Neurological:Denies numbness, tingling or new weaknesses Behavioral/Psych: Mood is stable, no new changes  Extremities: No lower extremity edema  All other systems were reviewed with the patient and are negative.  I have reviewed the past medical history, past surgical history, social history and family history with the patient and they are unchanged from previous note.  ALLERGIES:  has No Known Allergies.  MEDICATIONS:  Current Outpatient Prescriptions  Medication Sig Dispense Refill  . ALPRAZolam (XANAX) 0.5 MG tablet Take 1 tablet (0.5 mg total) by mouth at bedtime as needed for anxiety. 60 tablet 0  . Ascorbic Acid (VITAMIN C PO) Take 1 tablet by mouth daily.     . calcium carbonate (OS-CAL) 600 MG TABS tablet Take 600 mg by mouth daily.    . Cholecalciferol (VITAMIN D3) 1000 units CAPS Take 4,000 Units by mouth daily.    Marland Kitchen dexamethasone (DECADRON) 4 MG tablet Take 1  tablet (4 mg total) by mouth daily. 1 tablet daily before chemotherapy and 1 tablet day after chemotherapy 15 tablet 0  . dicyclomine (BENTYL) 10 MG capsule Take 1 capsule (10 mg total) by mouth 4 (four) times daily -  before meals and at bedtime. 60 capsule 1  . diphenoxylate-atropine (LOMOTIL) 2.5-0.025 MG tablet Take 1 tablet by mouth 4 (four) times daily as needed for diarrhea or loose stools. 120 tablet 2  . furosemide (LASIX) 20 MG tablet Take  1 tablet (20 mg total) by mouth daily. 30 tablet 0  . HYDROcodone-acetaminophen (NORCO) 10-325 MG tablet Take 1 tablet by mouth every 6 (six) hours as needed. 10 tablet 0  . ibuprofen (ADVIL,MOTRIN) 200 MG tablet Take 400-600 mg by mouth every 6 (six) hours as needed for moderate pain.    . lidocaine-prilocaine (EMLA) cream Apply to affected area once 30 g 3  . loperamide (IMODIUM) 2 MG capsule Take 2 mg by mouth as needed for diarrhea or loose stools.    . magnesium 30 MG tablet Take 30 mg by mouth daily.     . Multiple Vitamin (MULTIVITAMIN WITH MINERALS) TABS tablet Take 1 tablet by mouth daily.    . Omega-3 Fatty Acids (FISH OIL PO) Take 1 capsule by mouth daily.     . ondansetron (ZOFRAN) 8 MG tablet Take 1 tablet (8 mg total) by mouth 2 (two) times daily as needed for refractory nausea / vomiting. Start on day 3 after chemo. 30 tablet 1  . pantoprazole (PROTONIX) 40 MG tablet Take 40 mg by mouth 2 (two) times daily.   0  . potassium chloride (K-DUR) 10 MEQ tablet Take 1 tablet (10 mEq total) by mouth daily. 3 tablet 0  . Probiotic Product (PROBIOTIC DAILY PO) Take 1 capsule by mouth daily.     . prochlorperazine (COMPAZINE) 10 MG tablet Take 1 tablet (10 mg total) by mouth every 6 (six) hours as needed (Nausea or vomiting). 30 tablet 1  . saccharomyces boulardii (FLORASTOR) 250 MG capsule Take 1 capsule (250 mg total) by mouth 2 (two) times daily.    . zolpidem (AMBIEN) 5 MG tablet Take 1 tablet (5 mg total) by mouth at bedtime as needed for sleep. 30 tablet 0   No current facility-administered medications for this visit.     PHYSICAL EXAMINATION: ECOG PERFORMANCE STATUS: 1 - Symptomatic but completely ambulatory  Vitals:   02/18/16 0930  BP: 126/89  Pulse: (!) 111  Resp: 18  Temp: 98.3 F (36.8 C)   Filed Weights   02/18/16 0930  Weight: 144 lb 14.4 oz (65.7 kg)    GENERAL:alert, no distress and comfortable SKIN: skin color, texture, turgor are normal, no rashes or  significant lesions EYES: normal, Conjunctiva are pink and non-injected, sclera clear OROPHARYNX:no exudate, no erythema and lips, buccal mucosa, and tongue normal  NECK: supple, thyroid normal size, non-tender, without nodularity LYMPH:  no palpable lymphadenopathy in the cervical, axillary or inguinal LUNGS: clear to auscultation and percussion with normal breathing effort HEART: Tachycardia ABDOMEN:abdomen soft, non-tender and normal bowel sounds MUSCULOSKELETAL:no cyanosis of digits and no clubbing  NEURO: alert & oriented x 3 with fluent speech, no focal motor/sensory deficits EXTREMITIES: No lower extremity edema  LABORATORY DATA:  I have reviewed the data as listed   Chemistry      Component Value Date/Time   NA 142 02/11/2016 0910   K 3.5 02/11/2016 0910   CL 111 01/12/2016 0520   CO2 25 02/11/2016 0910     BUN 14.6 02/11/2016 0910   CREATININE 0.7 02/11/2016 0910      Component Value Date/Time   CALCIUM 9.1 02/11/2016 0910   ALKPHOS 60 02/11/2016 0910   AST 25 02/11/2016 0910   ALT 28 02/11/2016 0910   BILITOT 0.52 02/11/2016 0910       Lab Results  Component Value Date   WBC 17.2 (H) 02/18/2016   HGB 10.0 (L) 02/18/2016   HCT 29.8 (L) 02/18/2016   MCV 86.8 02/18/2016   PLT 231 02/18/2016   NEUTROABS 13.0 (H) 02/18/2016     ASSESSMENT & PLAN:  Breast cancer of upper-inner quadrant of right female breast (HCC) Right breast biopsy 2:00 6 cm from nipple: Grade 2-3 invasive ductal cancer, ER 90%, PR 2%, Ki-67 80%, HER-2 positive ratio 2.07; right biopsy 2:00 4 cm from nipple: IDC with DCIS, ER 95%, PR 10%, Ki-67 90%, HER-2 negative ratio 1.27 Breast MRI08/30/2017: 3 enhancing masses in the upper inner quadrant right breast spanning an area 3.7 cm (1.7 cm, 1 cm, 1.4 cm) no lymph node enlargement Clinical stage: T2N0 (stage 2A)  Treatment plan: 1. Genetic counseling: Normal did not reveal any abnormal mutations 2. Neoadjuvant chemotherapy. (TCH Perjeta 6  cycles followed by Herceptin maintenance for one year) 3. Breast conserving surgery followed by 4. Adjuvant radiation therapy followed by 5. Adjuvant antiestrogen therapy -------------------------------------------------------------------------------------------------------------------------------------------------- Current treatment: TCH Perjeta, Today is cycle 3 day 8 Echocardiogram 12/26/2015: EF 60-65%  Chemotherapy toxicities: 1. Hospitalization: 01/08/2016 to 01/12/2016 for neutropenic fever (because her insurance company denied Neulasta for cycle 1): We will plan on giving Neulasta with cycle 2 and will get insurance authorization again. Cultures were negative. 2. leg edema: was onLasix 20 mg daily 01/13/2016, resolved. DiscontinuedLasix 3. Severe anemia: Hemoglobin 10 slight improvement than before: Baseline hemoglobin of 13 g. Related to chemotherapy.  4. Hypokalemia: due to persistentdiarrhea. Potassium today is 3.3, encouraged increase dietary potassium 5. Chemotherapy-induced diarrhea: Finally subsided 6. Chemotherapy-induced abdominal cramps: Bentyl has helped tremendously. 7. Chemotherapy-induced nausea and vomiting: Resolved with reduced dosage of chemotherapy 8. Insomnia: We discontinued Ativan and prescribed her Ambien CR 5 mg at bedtime.  Monitoring closely for chemotherapy toxicities I instructed the patient to call us if she develops continued problems with dehydration or nausea vomiting so that we may have to give her IV additional IV fluids intermittently in between treatments.  Return to clinic in 2weeksforcycle 4.   No orders of the defined types were placed in this encounter.  The patient has a good understanding of the overall plan. she agrees with it. she will call with any problems that may develop before the next visit here.   Gudena, Vinay K, MD 02/18/16    

## 2016-02-18 NOTE — Assessment & Plan Note (Signed)
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 Genetic counseling: Normal did not reveal any abnormal mutations 2. Neoadjuvant chemotherapy. ([place] Perjeta 6 cycles followed by Herceptin maintenance for one year) 3. Breast conserving surgery followed by 4. Adjuvant radiation therapy followed by 5. Adjuvant antiestrogen therapy -------------------------------------------------------------------------------------------------------------------------------------------------- Current treatment: TCH Perjeta, Today is cycle 4 day 1 Echocardiogram 12/26/2015: EF 60-65%  Chemotherapy toxicities: 1. Hospitalization: 01/08/2016 to 01/12/2016 for neutropenic fever (because her insurance company denied Neulasta for cycle 1): We will plan on giving Neulasta with cycle 2 and will get insurance authorization again. Cultures were negative. 2. leg edema: was onLasix 20 mg daily 01/13/2016, resolved. DiscontinuedLasix 3. Severe anemia: Hemoglobin 9.2: Baseline hemoglobin of 13 g. Related to chemotherapy.  4. Hypokalemia: due to persistentdiarrhea. Potassium today is 3.5 5. Chemotherapy-induced diarrhea: On Lomotil in addition to Imodium. 6. Chemotherapy-induced abdominal cramps: Bentyl has helped tremendously. 7. Chemotherapy-induced nausea and vomiting:  8. Insomnia: We discontinued Ativan and prescribed her Ambien CR 5 mg at bedtime.  Monitoring closely for chemotherapy toxicities I instructed the patient to call us if she develops continued problems with dehydration or nausea vomiting so that we may have to give  her IV additional IV fluids intermittently in between treatments.  Return to clinic in 3weeksfor cycle 5.

## 2016-02-19 ENCOUNTER — Other Ambulatory Visit: Payer: Self-pay

## 2016-02-19 ENCOUNTER — Ambulatory Visit (HOSPITAL_BASED_OUTPATIENT_CLINIC_OR_DEPARTMENT_OTHER): Payer: BLUE CROSS/BLUE SHIELD

## 2016-02-19 DIAGNOSIS — C50211 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 female breast: Secondary | ICD-10-CM

## 2016-02-19 DIAGNOSIS — E860 Dehydration: Secondary | ICD-10-CM | POA: Diagnosis not present

## 2016-02-19 DIAGNOSIS — Z170 Estrogen receptor positive status [ER+]: Secondary | ICD-10-CM

## 2016-02-19 MED ORDER — SODIUM CHLORIDE 0.9 % IV SOLN
Freq: Once | INTRAVENOUS | Status: AC
  Administered 2016-02-19: 14:00:00 via INTRAVENOUS

## 2016-02-19 MED FILL — ONDANSETRON HCL 8 MG TABLET: 8 | 15 days supply | Qty: 30 | Fill #0

## 2016-02-19 NOTE — Progress Notes (Signed)
Received call from pt stating she was offered IVF yesterday but felt they were not needed; however, pt feels today that she needs to receive IVF.  Called infusion room who said they could accommodate her at 1:45.  Orders entered and called to let the pt know of appt time.  Pt wishes to have IVF done peripherally as she is experiencing soreness from [facility] access done last week that led to 3 attempts for access.  This information given to infusion RN.

## 2016-02-25 NOTE — Progress Notes (Signed)
Nutrition Assessment   Reason for Assessment: Referral received for wt loss and taste alterations.  ASSESSMENT: Spoke with patient via phone. 60 year old female with right breast cancer undergoing chemotherapy. Pt reports 2 out of 3 weeks between treatment no taste for food, metallic taste only eating bites.  Past medical history of GERD, neutropenic fever with hospital admission 12/2015   Medications: Vit C, Vit D, decadron, MVI, zofran, probiotic, compazine, florastor  Labs: reviewed  Anthropometrics:   Height: 63 inches Weight: 144 lb 14.4 oz, Noted wt on 12/20/15 of 162 lb BMI: 25.67   Estimated Energy Needs  Kcals: 1625-1950 kcals/d Protein: 65-78 g/d Fluid: >/=1.9 L/d  NUTRITION DIAGNOSIS: Inadequate oral intake related to cancer and cancer related treatment as evidenced by no taste for food, poor po intake and 11% wt loss in the last month.   INTERVENTION:   Encouraged pt to think of nutrition and eating as part of treatment plan.  Small frequent meals discussed.   Discussed strategies to improve taste (ie mouth rinse solution, using plastic ware, sucking on tart foods) Discussed ways to increase kcals and protein during this acute phase of treatment.  Will mail handouts.  Pt verbalized understanding and questions addressed    MONITORING, EVALUATION, GOAL: Pt will use strategies to improve nutrition and minimize further weight loss during treatment.   NEXT VISIT: as needed  Aftin Lye B. Zenia Resides, [place], [place] (pager)

## 2016-02-28 ENCOUNTER — Encounter: Payer: Self-pay | Admitting: Nutrition

## 2016-02-28 NOTE — Progress Notes (Signed)
Contacted patient by phone for follow-up based on her request.  Provided additional information to patient regarding taste alterations and poor appetite. Suggestions on foods to try to enhance taste. Patient was appreciative. Encouraged her to contact me with further questions.

## 2016-03-03 ENCOUNTER — Ambulatory Visit (HOSPITAL_BASED_OUTPATIENT_CLINIC_OR_DEPARTMENT_OTHER): Payer: BLUE CROSS/BLUE SHIELD

## 2016-03-03 ENCOUNTER — Encounter: Payer: Self-pay | Admitting: *Deleted

## 2016-03-03 ENCOUNTER — Other Ambulatory Visit (HOSPITAL_BASED_OUTPATIENT_CLINIC_OR_DEPARTMENT_OTHER): Payer: BLUE CROSS/BLUE SHIELD

## 2016-03-03 ENCOUNTER — Ambulatory Visit: Payer: BLUE CROSS/BLUE SHIELD

## 2016-03-03 ENCOUNTER — Ambulatory Visit (HOSPITAL_BASED_OUTPATIENT_CLINIC_OR_DEPARTMENT_OTHER): Payer: BLUE CROSS/BLUE SHIELD | Admitting: Oncology

## 2016-03-03 VITALS — BP 123/75 | HR 80 | Temp 98.1°F | Resp 18 | Ht 63.0 in | Wt 146.4 lb

## 2016-03-03 DIAGNOSIS — Z170 Estrogen receptor positive status [ER+]: Secondary | ICD-10-CM

## 2016-03-03 DIAGNOSIS — C50211 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 female breast: Secondary | ICD-10-CM

## 2016-03-03 DIAGNOSIS — Z95828 Presence of other vascular implants and grafts: Secondary | ICD-10-CM

## 2016-03-03 DIAGNOSIS — Z5112 Encounter for antineoplastic immunotherapy: Secondary | ICD-10-CM

## 2016-03-03 DIAGNOSIS — D6481 Anemia due to antineoplastic chemotherapy: Secondary | ICD-10-CM

## 2016-03-03 DIAGNOSIS — Z5111 Encounter for antineoplastic chemotherapy: Secondary | ICD-10-CM

## 2016-03-03 DIAGNOSIS — G4700 Insomnia, unspecified: Secondary | ICD-10-CM

## 2016-03-03 LAB — CBC WITH DIFFERENTIAL/PLATELET
BASO%: 0.4 % (ref 0.0–2.0)
Basophils Absolute: 0 10*3/uL (ref 0.0–0.1)
EOS ABS: 0 10*3/uL (ref 0.0–0.5)
EOS%: 0.1 % (ref 0.0–7.0)
HCT: 27.7 % — ABNORMAL LOW (ref 34.8–46.6)
HGB: 9.1 g/dL — ABNORMAL LOW (ref 11.6–15.9)
LYMPH%: 26.4 % (ref 14.0–49.7)
MCH: 29.4 pg (ref 25.1–34.0)
MCHC: 33 g/dL (ref 31.5–36.0)
MCV: 89.2 fL (ref 79.5–101.0)
MONO#: 0.5 10*3/uL (ref 0.1–0.9)
MONO%: 13.2 % (ref 0.0–14.0)
NEUT%: 59.9 % (ref 38.4–76.8)
NEUTROS ABS: 2.4 10*3/uL (ref 1.5–6.5)
Platelets: 176 10*3/uL (ref 145–400)
RBC: 3.11 10*6/uL — AB (ref 3.70–5.45)
RDW: 19.4 % — ABNORMAL HIGH (ref 11.2–14.5)
WBC: 4 10*3/uL (ref 3.9–10.3)
lymph#: 1 10*3/uL (ref 0.9–3.3)

## 2016-03-03 LAB — COMPREHENSIVE METABOLIC PANEL
ALT: 21 U/L (ref 0–55)
AST: 21 U/L (ref 5–34)
Albumin: 3.5 g/dL (ref 3.5–5.0)
Alkaline Phosphatase: 64 U/L (ref 40–150)
Anion Gap: 9 mEq/L (ref 3–11)
BILIRUBIN TOTAL: 0.64 mg/dL (ref 0.20–1.20)
BUN: 15.4 mg/dL (ref 7.0–26.0)
CHLORIDE: 107 meq/L (ref 98–109)
CO2: 23 meq/L (ref 22–29)
Calcium: 9.2 mg/dL (ref 8.4–10.4)
Creatinine: 0.7 mg/dL (ref 0.6–1.1)
GLUCOSE: 96 mg/dL (ref 70–140)
Potassium: 3.8 mEq/L (ref 3.5–5.1)
SODIUM: 139 meq/L (ref 136–145)
TOTAL PROTEIN: 5.9 g/dL — AB (ref 6.4–8.3)

## 2016-03-03 MED ORDER — HEPARIN SOD (PORK) LOCK FLUSH 100 UNIT/ML IV SOLN
500.0000 [IU] | Freq: Once | INTRAVENOUS | Status: AC | PRN
  Administered 2016-03-03: 500 [IU]
  Filled 2016-03-03: qty 5

## 2016-03-03 MED ORDER — PALONOSETRON HCL INJECTION 0.25 MG/5ML
INTRAVENOUS | Status: AC
  Filled 2016-03-03: qty 5

## 2016-03-03 MED ORDER — SODIUM CHLORIDE 0.9 % IV SOLN
Freq: Once | INTRAVENOUS | Status: AC
  Administered 2016-03-03: 12:00:00 via INTRAVENOUS

## 2016-03-03 MED ORDER — SODIUM CHLORIDE 0.9 % IV SOLN
420.0000 mg | Freq: Once | INTRAVENOUS | Status: AC
  Administered 2016-03-03: 420 mg via INTRAVENOUS
  Filled 2016-03-03: qty 14

## 2016-03-03 MED ORDER — PEGFILGRASTIM 6 MG/0.6ML ~~LOC~~ PSKT
6.0000 mg | PREFILLED_SYRINGE | Freq: Once | SUBCUTANEOUS | Status: AC
  Administered 2016-03-03: 6 mg via SUBCUTANEOUS
  Filled 2016-03-03: qty 0.6

## 2016-03-03 MED ORDER — SODIUM CHLORIDE 0.9% FLUSH
10.0000 mL | INTRAVENOUS | Status: DC | PRN
  Administered 2016-03-03: 10 mL
  Filled 2016-03-03: qty 10

## 2016-03-03 MED ORDER — ACETAMINOPHEN 325 MG PO TABS
650.0000 mg | ORAL_TABLET | Freq: Once | ORAL | Status: AC
  Administered 2016-03-03: 650 mg via ORAL

## 2016-03-03 MED ORDER — DOCETAXEL CHEMO INJECTION 160 MG/16ML
65.0000 mg/m2 | Freq: Once | INTRAVENOUS | Status: AC
  Administered 2016-03-03: 120 mg via INTRAVENOUS
  Filled 2016-03-03: qty 12

## 2016-03-03 MED ORDER — SODIUM CHLORIDE 0.9 % IJ SOLN
10.0000 mL | INTRAMUSCULAR | Status: DC | PRN
  Administered 2016-03-03: 10 mL via INTRAVENOUS
  Filled 2016-03-03: qty 10

## 2016-03-03 MED ORDER — TRASTUZUMAB CHEMO 150 MG IV SOLR
6.0000 mg/kg | Freq: Once | INTRAVENOUS | Status: AC
  Administered 2016-03-03: 441 mg via INTRAVENOUS
  Filled 2016-03-03: qty 21

## 2016-03-03 MED ORDER — DIPHENHYDRAMINE HCL 25 MG PO CAPS
ORAL_CAPSULE | ORAL | Status: AC
  Filled 2016-03-03: qty 1

## 2016-03-03 MED ORDER — DIPHENHYDRAMINE HCL 25 MG PO CAPS
25.0000 mg | ORAL_CAPSULE | Freq: Once | ORAL | Status: AC
  Administered 2016-03-03: 25 mg via ORAL

## 2016-03-03 MED ORDER — SODIUM CHLORIDE 0.9 % IV SOLN
438.8000 mg | Freq: Once | INTRAVENOUS | Status: AC
  Administered 2016-03-03: 440 mg via INTRAVENOUS
  Filled 2016-03-03: qty 44

## 2016-03-03 MED ORDER — SODIUM CHLORIDE 0.9 % IV SOLN
Freq: Once | INTRAVENOUS | Status: AC
  Administered 2016-03-03: 12:00:00 via INTRAVENOUS
  Filled 2016-03-03: qty 5

## 2016-03-03 MED ORDER — PALONOSETRON HCL INJECTION 0.25 MG/5ML
0.2500 mg | Freq: Once | INTRAVENOUS | Status: AC
  Administered 2016-03-03: 0.25 mg via INTRAVENOUS

## 2016-03-03 MED ORDER — ACETAMINOPHEN 325 MG PO TABS
ORAL_TABLET | ORAL | Status: AC
  Filled 2016-03-03: qty 2

## 2016-03-03 NOTE — Patient Instructions (Signed)
[facility] Discharge Instructions for Patients Receiving Chemotherapy  Today you received the following chemotherapy agents Herceptin, Perjeta, Taxotere and Carboplatin.   To help prevent nausea and vomiting after your treatment, we encourage you to take your nausea medication.   If you develop nausea and vomiting that is not controlled by your nausea medication, call the clinic.   BELOW ARE SYMPTOMS THAT SHOULD BE REPORTED IMMEDIATELY:  *FEVER GREATER THAN 100.5 F  *CHILLS WITH OR WITHOUT FEVER  NAUSEA AND VOMITING THAT IS NOT CONTROLLED WITH YOUR NAUSEA MEDICATION  *UNUSUAL SHORTNESS OF BREATH  *UNUSUAL BRUISING OR BLEEDING  TENDERNESS IN MOUTH AND THROAT WITH OR WITHOUT PRESENCE OF ULCERS  *URINARY PROBLEMS  *BOWEL PROBLEMS  UNUSUAL RASH Items with * indicate a potential emergency and should be followed up as soon as possible.  Feel free to call the clinic you have any questions or concerns. The clinic phone number is [phone].  Please show the CHEMO ALERT CARD at check-in to the Emergency Department and triage nurse.   

## 2016-03-03 NOTE — Progress Notes (Signed)
Patient Care Team: Kelton Pillar, MD as PCP - General (Family Medicine)  DIAGNOSIS:  No diagnosis found.  SUMMARY OF ONCOLOGIC HISTORY:   Breast cancer of upper-inner quadrant of right female breast ([place])   12/05/2015 Mammogram    Right breast mass 2:00 4 cm from nipple: 1.7 cm, T1c N0 stage IA; right breast 2:00 6 cm from nipple 1.3 cm mass, 7 mm satellite nodule between the 2 masses, T1 cN0 stage IA      12/06/2015 Initial Diagnosis    Right breast biopsy 2:00 6 cm from nipple: Grade 2-3 invasive ductal cancer, ER 90%, PR 2%, Ki-67 80%, HER-2 positive ratio 2.07; right biopsy 2:00 4 cm from nipple: IDC with DCIS, ER 95%, PR 10%, Ki-67 90%, HER-2 negative ratio 1.27      12/18/2015 Breast MRI    3 enhancing masses in the upper inner quadrant right breast spanning an area 3.7 cm (1.7 cm, 1 cm, 1.4 cm) no lymph node enlargement      12/31/2015 -  Neo-Adjuvant Chemotherapy    TCH Perjeta 6 cycles followed by Herceptin maintenance for 1 year      01/08/2016 - 01/12/2016 Hospital Admission    Neutropenic fever hospitalization (because patient did not receive Neulasta with cycle 1)      01/10/2016 Miscellaneous    Genetic testing was normal did not reveal any mutations       CHIEF COMPLIANT: Cycle 3 day 8 chemotherapy with [place] Perjeta  INTERVAL HISTORY:  Patricia Chandler returns today for follow-up of her breast A accompanied by her husband Zenia Resides. Today is day 1 cycle 4 of 6 planned cycles of carboplatin/docetaxel/trastuzumab and pertuzumab.  REVIEW OF SYSTEMS:   She tells me that she had fever and low count problems with the first cycle, and significant nausea with the second cycle. With cycle 3 her doses were decreased, and she did better. However she had a little bit more nausea and fatigue on day 2. On day 4 she took some Zofran and that constipated her. She still feeling tired. Her appetite is down. Her sense of taste is altered. Overall she tells me she has lost about 20 pounds in  about the only thing she feels like eating as yogurt. She describes herself is severely fatigued. She has a runny nose, and some reflux symptoms. She gets diarrhea which can be severe at times. She has both Imodium and Lomotil on hand. She feels forgetful but not anxious or depressed. A detailed review of systems today was otherwise noncontributory  I have reviewed the past medical history, past surgical history, social history and family history with the patient and they are unchanged from previous note.  ALLERGIES:  has No Known Allergies.  MEDICATIONS:  Current Outpatient Prescriptions  Medication Sig Dispense Refill  . ALPRAZolam (XANAX) 0.5 MG tablet Take 1 tablet (0.5 mg total) by mouth at bedtime as needed for anxiety. 60 tablet 0  . Ascorbic Acid (VITAMIN C PO) Take 1 tablet by mouth daily.     . calcium carbonate (OS-CAL) 600 MG TABS tablet Take 600 mg by mouth daily.    . Cholecalciferol (VITAMIN D3) 1000 units CAPS Take 4,000 Units by mouth daily.    Marland Kitchen dexamethasone (DECADRON) 4 MG tablet Take 1 tablet (4 mg total) by mouth daily. 1 tablet daily before chemotherapy and 1 tablet day after chemotherapy 15 tablet 0  . dicyclomine (BENTYL) 10 MG capsule Take 1 capsule (10 mg total) by mouth 4 (four) times daily -  before meals and at bedtime. 60 capsule 1  . diphenoxylate-atropine (LOMOTIL) 2.5-0.025 MG tablet Take 1 tablet by mouth 4 (four) times daily as needed for diarrhea or loose stools. 120 tablet 2  . furosemide (LASIX) 20 MG tablet Take 1 tablet (20 mg total) by mouth daily. 30 tablet 0  . HYDROcodone-acetaminophen (NORCO) 10-325 MG tablet Take 1 tablet by mouth every 6 (six) hours as needed. 10 tablet 0  . ibuprofen (ADVIL,MOTRIN) 200 MG tablet Take 400-600 mg by mouth every 6 (six) hours as needed for moderate pain.    Marland Kitchen lidocaine-prilocaine (EMLA) cream Apply to affected area once 30 g 3  . loperamide (IMODIUM) 2 MG capsule Take 2 mg by mouth as needed for diarrhea or loose  stools.    . magnesium 30 MG tablet Take 30 mg by mouth daily.     . Multiple Vitamin (MULTIVITAMIN WITH MINERALS) TABS tablet Take 1 tablet by mouth daily.    . Omega-3 Fatty Acids (FISH OIL PO) Take 1 capsule by mouth daily.     . ondansetron (ZOFRAN) 8 MG tablet Take 1 tablet (8 mg total) by mouth 2 (two) times daily as needed for refractory nausea / vomiting. Start on day 3 after chemo. 30 tablet 1  . pantoprazole (PROTONIX) 40 MG tablet Take 40 mg by mouth 2 (two) times daily.   0  . potassium chloride (K-DUR) 10 MEQ tablet Take 1 tablet (10 mEq total) by mouth daily. 3 tablet 0  . Probiotic Product (PROBIOTIC DAILY PO) Take 1 capsule by mouth daily.     . prochlorperazine (COMPAZINE) 10 MG tablet Take 1 tablet (10 mg total) by mouth every 6 (six) hours as needed (Nausea or vomiting). 30 tablet 1  . saccharomyces boulardii (FLORASTOR) 250 MG capsule Take 1 capsule (250 mg total) by mouth 2 (two) times daily.    Marland Kitchen zolpidem (AMBIEN) 5 MG tablet Take 1 tablet (5 mg total) by mouth at bedtime as needed for sleep. 30 tablet 0   No current facility-administered medications for this visit.     PHYSICAL EXAMINATION: Middle-aged white woman who appears stated age  ECOG PERFORMANCE STATUS: 1 - Symptomatic but completely ambulatory  Vitals:   03/03/16 1034  BP: 123/75  Pulse: 80  Resp: 18  Temp: 98.1 F (36.7 C)   Filed Weights   03/03/16 1034  Weight: 146 lb 6.4 oz (66.4 kg)    Sclerae unicteric, pupils round and equal Oropharynx clear and moist-- no thrush or other lesions No cervical or supraclavicular adenopathy Lungs no rales or rhonchi Heart regular rate and rhythm Abd soft, nontender, positive bowel sounds MSK no focal spinal tenderness, no upper extremity lymphedema Neuro: nonfocal, well oriented, appropriate affect Breasts: Deferred   LABORATORY DATA:  I have reviewed the data as listed   Chemistry      Component Value Date/Time   NA 138 02/18/2016 0911   K 3.3 (L)  02/18/2016 0911   CL 111 01/12/2016 0520   CO2 26 02/18/2016 0911   BUN 11.4 02/18/2016 0911   CREATININE 0.8 02/18/2016 0911      Component Value Date/Time   CALCIUM 9.5 02/18/2016 0911   ALKPHOS 126 02/18/2016 0911   AST 32 02/18/2016 0911   ALT 40 02/18/2016 0911   BILITOT 0.35 02/18/2016 0911       Lab Results  Component Value Date   WBC 4.0 03/03/2016   HGB 9.1 (L) 03/03/2016   HCT 27.7 (L) 03/03/2016   MCV  89.2 03/03/2016   PLT 176 03/03/2016   NEUTROABS 2.4 03/03/2016     ASSESSMENT & PLAN:  Breast cancer of upper-inner quadrant of right female breast (HCC) Right breast biopsy 2:00 6 cm from nipple: Grade 2-3 invasive ductal cancer, ER 90%, PR 2%, Ki-67 80%, HER-2 positive ratio 2.07; right biopsy 2:00 4 cm from nipple: IDC with DCIS, ER 95%, PR 10%, Ki-67 90%, HER-2 negative ratio 1.27 Breast MRI08/30/2017: 3 enhancing masses in the upper inner quadrant right breast spanning an area 3.7 cm (1.7 cm, 1 cm, 1.4 cm) no lymph node enlargement Clinical stage: T2N0 (stage 2A)  Treatment plan: 1. Genetic counseling: Normal did not reveal any abnormal mutations 2. Neoadjuvant chemotherapy. ([place] Perjeta 6 cycles followed by Herceptin maintenance for one year) 3. Breast conserving surgery followed by 4. Adjuvant radiation therapy followed by 5. Adjuvant antiestrogen therapy -------------------------------------------------------------------------------------------------------------------------------------------------- Current treatment: TCH Perjeta, Today is cycle 3 day 8 Echocardiogram 12/26/2015: EF 60-65%  Chemotherapy toxicities: 1. Hospitalization: 01/08/2016 to 01/12/2016 for neutropenic fever (because her insurance company denied Neulasta for cycle 1): We will plan on giving Neulasta with cycle 2 and will get insurance authorization again. Cultures were negative. 2. leg edema: was onLasix 20 mg daily 01/13/2016, resolved. DiscontinuedLasix 3. Severe anemia:  Hemoglobin 10 slight improvement than before: Baseline hemoglobin of 13 g. Related to chemotherapy.  4. Hypokalemia: due to persistentdiarrhea. Potassium today is 3.3, encouraged increase dietary potassium 5. Chemotherapy-induced diarrhea: Finally subsided 6. Chemotherapy-induced abdominal cramps: Bentyl has helped tremendously. 7. Chemotherapy-induced nausea and vomiting: Resolved with reduced dosage of chemotherapy 8. Insomnia: We discontinued Ativan and prescribed her Ambien CR 5 mg at bedtime.  Monitoring closely for chemotherapy toxicities I instructed the patient to call us if she develops continued problems with dehydration or nausea vomiting so that we may have to give her IV additional IV fluids intermittently in between treatments.  Return to clinic in 2weeksforcycle 4.   No orders of the defined types were placed in this encounter.  PLAN: She did well with cycle 3, except for the days 2 and 3 lassitude, so I am making no changes in her treatment. She is proceeding to cycle 4 today.  She usually sees Dr. Sonny Dandy approximately a week after each cycle, which I think is prudent, and I have gone ahead and set her up for that  She had many questions regarding what kind of surgery she should have. She is interested in a mastectomy because she would like to avoid radiation. The main reason she would like to avoid radiation as damage to the lung. We discussed the fact that there was tumor along generally is minimal and has no long-term complications for the vast majority of patients. She is aware that there is no survival advantage to mastectomy versus lumpectomy  However her concern is complicated by the fact that she rated has an implant in place. She is worried that the radiation may cause the implant to become hardened and the whole breast to become unsightly. If she then wanted a mastectomy and reconstruction she would have to deal with irradiated skin, which might make that more  difficult.  These are real concerns and the solution is not obvious. I think the way to go would be for her to discuss with her surgeon what kind of mastectomy he thinks she would have if she had a mastectomy and with Dr. Iran Planas her plastic surgeon what is her opinion about her having reconstruction if she develops a capsule or other significant  problems if she has a lumpectomy and radiation.  She will follow-up on these questions with Dr. Sonny Dandy at their next visit.  Chauncey Cruel, MD 03/03/16

## 2016-03-08 NOTE — Assessment & Plan Note (Signed)
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 Genetic counseling: Normal did not reveal any abnormal mutations 2. Neoadjuvant chemotherapy. ([place] Perjeta 6 cycles followed by Herceptin maintenance for one year) 3. Breast conserving surgery followed by 4. Adjuvant radiation therapy followed by 5. Adjuvant antiestrogen therapy -------------------------------------------------------------------------------------------------------------------------------------------------- Current treatment: TCH Perjeta, Today is cycle 4day 1 Echocardiogram 12/26/2015: EF 60-65%  Chemotherapy toxicities: 1. Hospitalization: 01/08/2016 to 01/12/2016 for neutropenic fever (because her insurance company denied Neulasta for cycle 1): We will plan on giving Neulasta with cycle 2 and will get insurance authorization again. Cultures were negative. 2. leg edema: was onLasix 20 mg daily 01/13/2016, resolved. DiscontinuedLasix 3. Severe anemia: Hemoglobin 10 slight improvement than before: Baseline hemoglobin of 13 g. Related to chemotherapy.  4. Hypokalemia: due to persistentdiarrhea. Potassium today is 3.3, encouraged increase dietary potassium 5. Chemotherapy-induced diarrhea: Finally subsided 6. Chemotherapy-induced abdominal cramps: Bentyl has helped tremendously. 7. Chemotherapy-induced nausea and vomiting: Resolved with reduced dosage of chemotherapy 8. Insomnia: We discontinuedAtivan and prescribedher Ambien CR 5 mg at bedtime.  Monitoring closely for chemotherapy toxicities I instructed the patient to call us if  she develops continued problems with dehydration or nausea vomiting so that we may have to give her IV additional IV fluids intermittently in between treatments.  Return to clinic in 3weeksforcycle 5

## 2016-03-10 ENCOUNTER — Ambulatory Visit (HOSPITAL_BASED_OUTPATIENT_CLINIC_OR_DEPARTMENT_OTHER): Payer: BLUE CROSS/BLUE SHIELD | Admitting: Hematology and Oncology

## 2016-03-10 ENCOUNTER — Encounter: Payer: Self-pay | Admitting: Hematology and Oncology

## 2016-03-10 ENCOUNTER — Ambulatory Visit: Payer: BLUE CROSS/BLUE SHIELD

## 2016-03-10 ENCOUNTER — Other Ambulatory Visit (HOSPITAL_BASED_OUTPATIENT_CLINIC_OR_DEPARTMENT_OTHER): Payer: BLUE CROSS/BLUE SHIELD

## 2016-03-10 ENCOUNTER — Ambulatory Visit (HOSPITAL_BASED_OUTPATIENT_CLINIC_OR_DEPARTMENT_OTHER): Payer: BLUE CROSS/BLUE SHIELD | Admitting: Nurse Practitioner

## 2016-03-10 VITALS — BP 127/88 | HR 117 | Temp 98.1°F | Resp 18 | Wt 140.0 lb

## 2016-03-10 VITALS — BP 130/74 | HR 101 | Temp 98.7°F | Resp 18

## 2016-03-10 DIAGNOSIS — G4700 Insomnia, unspecified: Secondary | ICD-10-CM

## 2016-03-10 DIAGNOSIS — G620 Drug-induced polyneuropathy: Secondary | ICD-10-CM | POA: Diagnosis not present

## 2016-03-10 DIAGNOSIS — Z170 Estrogen receptor positive status [ER+]: Secondary | ICD-10-CM | POA: Diagnosis not present

## 2016-03-10 DIAGNOSIS — R630 Anorexia: Secondary | ICD-10-CM

## 2016-03-10 DIAGNOSIS — C50211 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 female breast: Secondary | ICD-10-CM | POA: Diagnosis not present

## 2016-03-10 DIAGNOSIS — D6481 Anemia due to antineoplastic chemotherapy: Secondary | ICD-10-CM | POA: Diagnosis not present

## 2016-03-10 DIAGNOSIS — Z95828 Presence of other vascular implants and grafts: Secondary | ICD-10-CM

## 2016-03-10 LAB — CBC WITH DIFFERENTIAL/PLATELET
BASO%: 0.4 % (ref 0.0–2.0)
BASOS ABS: 0 10*3/uL (ref 0.0–0.1)
EOS%: 0.1 % (ref 0.0–7.0)
Eosinophils Absolute: 0 10*3/uL (ref 0.0–0.5)
HEMATOCRIT: 28.6 % — AB (ref 34.8–46.6)
HEMOGLOBIN: 9.6 g/dL — AB (ref 11.6–15.9)
LYMPH#: 1.2 10*3/uL (ref 0.9–3.3)
LYMPH%: 24 % (ref 14.0–49.7)
MCH: 29.9 pg (ref 25.1–34.0)
MCHC: 33.5 g/dL (ref 31.5–36.0)
MCV: 89.3 fL (ref 79.5–101.0)
MONO#: 0.6 10*3/uL (ref 0.1–0.9)
MONO%: 12.2 % (ref 0.0–14.0)
NEUT#: 3.3 10*3/uL (ref 1.5–6.5)
NEUT%: 63.3 % (ref 38.4–76.8)
Platelets: 108 10*3/uL — ABNORMAL LOW (ref 145–400)
RBC: 3.21 10*6/uL — ABNORMAL LOW (ref 3.70–5.45)
RDW: 18.9 % — AB (ref 11.2–14.5)
WBC: 5.1 10*3/uL (ref 3.9–10.3)

## 2016-03-10 LAB — COMPREHENSIVE METABOLIC PANEL
ALBUMIN: 3.9 g/dL (ref 3.5–5.0)
ALK PHOS: 103 U/L (ref 40–150)
ALT: 38 U/L (ref 0–55)
AST: 27 U/L (ref 5–34)
Anion Gap: 10 mEq/L (ref 3–11)
BUN: 19.1 mg/dL (ref 7.0–26.0)
CALCIUM: 9.6 mg/dL (ref 8.4–10.4)
CO2: 27 mEq/L (ref 22–29)
CREATININE: 0.8 mg/dL (ref 0.6–1.1)
Chloride: 100 mEq/L (ref 98–109)
EGFR: 81 mL/min/{1.73_m2} — ABNORMAL LOW (ref >90)
Glucose: 98 mg/dl (ref 70–140)
Potassium: 3.4 mEq/L — ABNORMAL LOW (ref 3.5–5.1)
Sodium: 136 mEq/L (ref 136–145)
TOTAL PROTEIN: 6.6 g/dL (ref 6.4–8.3)
Total Bilirubin: 0.62 mg/dL (ref 0.20–1.20)

## 2016-03-10 MED ORDER — HEPARIN SOD (PORK) LOCK FLUSH 100 UNIT/ML IV SOLN
500.0000 [IU] | Freq: Once | INTRAVENOUS | Status: AC
  Administered 2016-03-10: 500 [IU] via INTRAVENOUS
  Filled 2016-03-10: qty 5

## 2016-03-10 MED ORDER — HEPARIN SOD (PORK) LOCK FLUSH 100 UNIT/ML IV SOLN
500.0000 [IU] | Freq: Once | INTRAVENOUS | Status: AC | PRN
  Administered 2016-03-10: 500 [IU] via INTRAVENOUS
  Filled 2016-03-10: qty 5

## 2016-03-10 MED ORDER — SODIUM CHLORIDE 0.9 % IV SOLN
Freq: Once | INTRAVENOUS | Status: AC
  Administered 2016-03-10: 12:00:00 via INTRAVENOUS
  Filled 2016-03-10: qty 1000

## 2016-03-10 MED ORDER — SODIUM CHLORIDE 0.9 % IJ SOLN
10.0000 mL | INTRAMUSCULAR | Status: DC | PRN
  Administered 2016-03-10: 10 mL via INTRAVENOUS
  Filled 2016-03-10: qty 10

## 2016-03-10 MED ORDER — SODIUM CHLORIDE 0.9% FLUSH
10.0000 mL | Freq: Once | INTRAVENOUS | Status: AC
  Administered 2016-03-10: 10 mL via INTRAVENOUS
  Filled 2016-03-10: qty 10

## 2016-03-10 NOTE — Progress Notes (Signed)
Patient Care Team: Kelton Pillar, MD as PCP - General (Family Medicine)  DIAGNOSIS:  Encounter Diagnoses  Name Primary?  . 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 breast in female, estrogen receptor positive ([place])   . Dehydration Yes    SUMMARY OF ONCOLOGIC HISTORY:   Breast cancer of upper-inner quadrant of right female breast ([place])   12/05/2015 Mammogram    Right breast mass 2:00 4 cm from nipple: 1.7 cm, T1c N0 stage IA; right breast 2:00 6 cm from nipple 1.3 cm mass, 7 mm satellite nodule between the 2 masses, T1 cN0 stage IA      12/06/2015 Initial Diagnosis    Right breast biopsy 2:00 6 cm from nipple: Grade 2-3 invasive ductal cancer, ER 90%, PR 2%, Ki-67 80%, HER-2 positive ratio 2.07; right biopsy 2:00 4 cm from nipple: IDC with DCIS, ER 95%, PR 10%, Ki-67 90%, HER-2 negative ratio 1.27      12/18/2015 Breast MRI    3 enhancing masses in the upper inner quadrant right breast spanning an area 3.7 cm (1.7 cm, 1 cm, 1.4 cm) no lymph node enlargement      12/31/2015 -  Neo-Adjuvant Chemotherapy    TCH Perjeta 6 cycles followed by Herceptin maintenance for 1 year      01/08/2016 - 01/12/2016 Hospital Admission    Neutropenic fever hospitalization (because patient did not receive Neulasta with cycle 1)      01/10/2016 Miscellaneous    Genetic testing was normal did not reveal any mutations       CHIEF COMPLIANT: Cycle 5 TCH Perjeta  INTERVAL HISTORY: Patricia Chandler is a 60 year old with above-mentioned history of right breast cancer currently on neoadjuvant chemotherapy and today's cycle 5 of [place]. She has had multiple problems with chemotherapy including abdominal cramps and diarrhea. The symptoms have been improved with Bentyl. She also takes intermittent Imodium. Denies any nausea vomiting. Very mild numbness of the tips of the fingers.  REVIEW OF SYSTEMS:   Constitutional: Denies fevers, chills or abnormal weight loss, complains of  fatigue Eyes: Denies blurriness of vision Ears, nose, mouth, throat, and face: Denies mucositis or sore throat Respiratory: Denies cough, dyspnea or wheezes Cardiovascular: Denies palpitation, chest discomfort Gastrointestinal:  Denies nausea, heartburn or change in bowel habits Skin: Denies abnormal skin rashes Lymphatics: Denies new lymphadenopathy or easy bruising Neurological: Mild numbness of the fingertips. Behavioral/Psych: Mood is stable, no new changes  Extremities: No lower extremity edema  All other systems were reviewed with the patient and are negative.  I have reviewed the past medical history, past surgical history, social history and family history with the patient and they are unchanged from previous note.  ALLERGIES:  has No Known Allergies.  MEDICATIONS:  Current Outpatient Prescriptions  Medication Sig Dispense Refill  . ALPRAZolam (XANAX) 0.5 MG tablet Take 1 tablet (0.5 mg total) by mouth at bedtime as needed for anxiety. 60 tablet 0  . Ascorbic Acid (VITAMIN C PO) Take 1 tablet by mouth daily.     . calcium carbonate (OS-CAL) 600 MG TABS tablet Take 600 mg by mouth daily.    . Cholecalciferol (VITAMIN D3) 1000 units CAPS Take 4,000 Units by mouth daily.    Marland Kitchen dexamethasone (DECADRON) 4 MG tablet Take 1 tablet (4 mg total) by mouth daily. 1 tablet daily before chemotherapy and 1 tablet day after chemotherapy 15 tablet 0  . dicyclomine (BENTYL) 10 MG capsule Take 1 capsule (10 mg total) by mouth 4 (four)  times daily -  before meals and at bedtime. 60 capsule 1  . diphenoxylate-atropine (LOMOTIL) 2.5-0.025 MG tablet Take 1 tablet by mouth 4 (four) times daily as needed for diarrhea or loose stools. 120 tablet 2  . furosemide (LASIX) 20 MG tablet Take 1 tablet (20 mg total) by mouth daily. 30 tablet 0  . HYDROcodone-acetaminophen (NORCO) 10-325 MG tablet Take 1 tablet by mouth every 6 (six) hours as needed. 10 tablet 0  . ibuprofen (ADVIL,MOTRIN) 200 MG tablet Take  400-600 mg by mouth every 6 (six) hours as needed for moderate pain.    Marland Kitchen lidocaine-prilocaine (EMLA) cream Apply to affected area once 30 g 3  . loperamide (IMODIUM) 2 MG capsule Take 2 mg by mouth as needed for diarrhea or loose stools.    . magnesium 30 MG tablet Take 30 mg by mouth daily.     . Multiple Vitamin (MULTIVITAMIN WITH MINERALS) TABS tablet Take 1 tablet by mouth daily.    . Omega-3 Fatty Acids (FISH OIL PO) Take 1 capsule by mouth daily.     . ondansetron (ZOFRAN) 8 MG tablet Take 1 tablet (8 mg total) by mouth 2 (two) times daily as needed for refractory nausea / vomiting. Start on day 3 after chemo. 30 tablet 1  . pantoprazole (PROTONIX) 40 MG tablet Take 40 mg by mouth 2 (two) times daily.   0  . potassium chloride (K-DUR) 10 MEQ tablet Take 1 tablet (10 mEq total) by mouth daily. 3 tablet 0  . Probiotic Product (PROBIOTIC DAILY PO) Take 1 capsule by mouth daily.     . prochlorperazine (COMPAZINE) 10 MG tablet Take 1 tablet (10 mg total) by mouth every 6 (six) hours as needed (Nausea or vomiting). 30 tablet 1  . saccharomyces boulardii (FLORASTOR) 250 MG capsule Take 1 capsule (250 mg total) by mouth 2 (two) times daily.    Marland Kitchen zolpidem (AMBIEN) 5 MG tablet Take 1 tablet (5 mg total) by mouth at bedtime as needed for sleep. 30 tablet 0   No current facility-administered medications for this visit.    Facility-Administered Medications Ordered in Other Visits  Medication Dose Route Frequency Provider Last Rate Last Dose  . heparin lock flush 100 unit/mL  500 Units Intravenous Once Nicholas Lose, MD      . sodium chloride flush (NS) 0.9 % injection 10 mL  10 mL Intravenous Once Nicholas Lose, MD        PHYSICAL EXAMINATION: ECOG PERFORMANCE STATUS: 1 - Symptomatic but completely ambulatory  Vitals:   03/10/16 0900  BP: 127/88  Pulse: (!) 117  Resp: 18  Temp: 98.1 F (36.7 C)   Filed Weights   03/10/16 0900  Weight: 140 lb (63.5 kg)    GENERAL:alert, no distress and  comfortable SKIN: skin color, texture, turgor are normal, no rashes or significant lesions EYES: normal, Conjunctiva are pink and non-injected, sclera clear OROPHARYNX:no exudate, no erythema and lips, buccal mucosa, and tongue normal  NECK: supple, thyroid normal size, non-tender, without nodularity LYMPH:  no palpable lymphadenopathy in the cervical, axillary or inguinal LUNGS: clear to auscultation and percussion with normal breathing effort HEART: regular rate & rhythm and no murmurs and no lower extremity edema ABDOMEN:abdomen soft, non-tender and normal bowel sounds MUSCULOSKELETAL:no cyanosis of digits and no clubbing  NEURO: alert & oriented x 3 with fluent speech, no focal motor/sensory deficits EXTREMITIES: No lower extremity edema  LABORATORY DATA:  I have reviewed the data as listed   Chemistry  Component Value Date/Time   NA 136 03/10/2016 0920   K 3.4 (L) 03/10/2016 0920   CL 111 01/12/2016 0520   CO2 27 03/10/2016 0920   BUN 19.1 03/10/2016 0920   CREATININE 0.8 03/10/2016 0920      Component Value Date/Time   CALCIUM 9.6 03/10/2016 0920   ALKPHOS 103 03/10/2016 0920   AST 27 03/10/2016 0920   ALT 38 03/10/2016 0920   BILITOT 0.62 03/10/2016 0920       Lab Results  Component Value Date   WBC 5.1 03/10/2016   HGB 9.6 (L) 03/10/2016   HCT 28.6 (L) 03/10/2016   MCV 89.3 03/10/2016   PLT 108 (L) 03/10/2016   NEUTROABS 3.3 03/10/2016     ASSESSMENT & PLAN:  Breast cancer of upper-inner quadrant of right female breast ([place]) Right breast biopsy 2:00 6 cm from nipple: Grade 2-3 invasive ductal cancer, ER 90%, PR 2%, Ki-67 80%, HER-2 positive ratio 2.07; right biopsy 2:00 4 cm from nipple: IDC with DCIS, ER 95%, PR 10%, Ki-67 90%, HER-2 negative ratio 1.27 Breast MRI08/30/2017: 3 enhancing masses in the upper inner quadrant right breast spanning an area 3.7 cm (1.7 cm, 1 cm, 1.4 cm) no lymph node enlargement Clinical stage: T2N0 (stage 2A)  Treatment  plan: 1. Genetic counseling: Normal did not reveal any abnormal mutations 2. Neoadjuvant chemotherapy. ([place] Perjeta 6 cycles followed by Herceptin maintenance for one year) 3. Breast conserving surgery followed by 4. Adjuvant radiation therapy followed by 5. Adjuvant antiestrogen therapy -------------------------------------------------------------------------------------------------------------------------------------------------- Current treatment: TCH Perjeta, Today is cycle 4day 8 Echocardiogram 12/26/2015: EF 60-65%  Chemotherapy toxicities: 1. Hospitalization: 01/08/2016 to 01/12/2016 for neutropenic fever (because her insurance company denied Neulasta for cycle 1): We will plan on giving Neulasta with cycle 2 and will get insurance authorization again. Cultures were negative. 2. leg edema: was onLasix 20 mg daily 01/13/2016, resolved. DiscontinuedLasix 3. Severe anemia: Hemoglobin 9.1. Baseline hemoglobin of 13 g. Related to chemotherapy.  4. Hypokalemia: due to persistentdiarrhea. Potassium today is 3.3, encouraged increase dietary potassium 5. Chemotherapy-induced diarrhea: Finally subsided 6. Chemotherapy-induced abdominal cramps: Bentyl has helped tremendously. 7. Chemotherapy-induced nausea and vomiting: Resolved with reduced dosage of chemotherapy 8. Insomnia: On Ambien CR 5 mg at bedtime. 9. Chemotherapy-induced peripheral neuropathy: Grade 1 10. Profound lack of taste and appetite: We talked about appetite stimulants and I elected not to pursue them at this time.  Monitoring closely for chemotherapy toxicities atient will get fluids today with potassium. We will set her up for breast MRI an appointment with the surgery after cycle 6. Return to clinic in 2weeksforcycle 5 Patient wishes to follow up with surgery to discuss  different treatment options.he has been in conversations with her daughter about bilateral mastectomies. I discussed with her that it would be  excessive therapy  But something that she could discuss with her surgeon. He is concerned that since she has implants,radiation to the breast may cause contractures.  I briefly discussed the role of  Herceptin and Perjeta maintenanceas well as the role of Neratinibin HER-2 positive breast cancer.  No orders of the defined types were placed in this encounter.  The patient has a good understanding of the overall plan. she agrees with it. she will call with any problems that may develop before the next visit here.   Patricia Eisenmenger, MD 03/10/16

## 2016-03-10 NOTE — Patient Instructions (Signed)
Dehydration, Adult Dehydration is a condition in which there is not enough fluid or water in the body. This happens when you lose more fluids than you take in. Important organs, such as the kidneys, brain, and heart, cannot function without a proper amount of fluids. Any loss of fluids from the body can lead to dehydration. Dehydration can range from mild to severe. This condition should be treated right away to prevent it from becoming severe. What are the causes? This condition may be caused by:  Vomiting.  Diarrhea.  Excessive sweating, such as from heat exposure or exercise.  Not drinking enough fluid, especially:  When ill.  While doing activity that requires a lot of energy.  Excessive urination.  Fever.  Infection.  Certain medicines, such as medicines that cause the body to lose excess fluid (diuretics).  Inability to access safe drinking water.  Reduced physical ability to get adequate water and food. What increases the risk? This condition is more likely to develop in people:  Who have a poorly controlled long-term (chronic) illness, such as diabetes, heart disease, or kidney disease.  Who are age 65 or older.  Who are disabled.  Who live in a place with high altitude.  Who play endurance sports. What are the signs or symptoms? Symptoms of mild dehydration may include:   Thirst.  Dry lips.  Slightly dry mouth.  Dry, warm skin.  Dizziness. Symptoms of moderate dehydration may include:   Very dry mouth.  Muscle cramps.  Dark urine. Urine may be the color of tea.  Decreased urine production.  Decreased tear production.  Heartbeat that is irregular or faster than normal (palpitations).  Headache.  Light-headedness, especially when you stand up from a sitting position.  Fainting (syncope). Symptoms of severe dehydration may include:   Changes in skin, such as:  Cold and clammy skin.  Blotchy (mottled) or pale skin.  Skin that does  not quickly return to normal after being lightly pinched and released (poor skin turgor).  Changes in body fluids, such as:  Extreme thirst.  No tear production.  Inability to sweat when body temperature is high, such as in hot weather.  Very little urine production.  Changes in vital signs, such as:  Weak pulse.  Pulse that is more than 100 beats a minute when sitting still.  Rapid breathing.  Low blood pressure.  Other changes, such as:  Sunken eyes.  Cold hands and feet.  Confusion.  Lack of energy (lethargy).  Difficulty waking up from sleep.  Short-term weight loss.  Unconsciousness. How is this diagnosed? This condition is diagnosed based on your symptoms and a physical exam. Blood and urine tests may be done to help confirm the diagnosis. How is this treated? Treatment for this condition depends on the severity. Mild or moderate dehydration can often be treated at home. Treatment should be started right away. Do not wait until dehydration becomes severe. Severe dehydration is an emergency and it needs to be treated in a hospital. Treatment for mild dehydration may include:   Drinking more fluids.  Replacing salts and minerals in your blood (electrolytes) that you may have lost. Treatment for moderate dehydration may include:   Drinking an oral rehydration solution (ORS). This is a drink that helps you replace fluids and electrolytes (rehydrate). It can be found at pharmacies and retail stores. Treatment for severe dehydration may include:   Receiving fluids through an IV tube.  Receiving an electrolyte solution through a feeding tube that is   passed through your nose and into your stomach (nasogastric tube, or NG tube).  Correcting any abnormalities in electrolytes.  Treating the underlying cause of dehydration. Follow these instructions at home:  If directed by your health care provider, drink an ORS:  Make an ORS by following instructions on the  package.  Start by drinking small amounts, about  cup (120 mL) every 5-10 minutes.  Slowly increase how much you drink until you have taken the amount recommended by your health care provider.  Drink enough clear fluid to keep your urine clear or pale yellow. If you were told to drink an ORS, finish the ORS first, then start slowly drinking other clear fluids. Drink fluids such as:  Water. Do not drink only water. Doing that can lead to having too little salt (sodium) in the body (hyponatremia).  Ice chips.  Fruit juice that you have added water to (diluted fruit juice).  Low-calorie sports drinks.  Avoid:  Alcohol.  Drinks that contain a lot of sugar. These include high-calorie sports drinks, fruit juice that is not diluted, and soda.  Caffeine.  Foods that are greasy or contain a lot of fat or sugar.  Take over-the-counter and prescription medicines only as told by your health care provider.  Do not take sodium tablets. This can lead to having too much sodium in the body (hypernatremia).  Eat foods that contain a healthy balance of electrolytes, such as bananas, oranges, potatoes, tomatoes, and spinach.  Keep all follow-up visits as told by your health care provider. This is important. Contact a health care provider if:  You have abdominal pain that:  Gets worse.  Stays in one area (localizes).  You have a rash.  You have a stiff neck.  You are more irritable than usual.  You are sleepier or more difficult to wake up than usual.  You feel weak or dizzy.  You feel very thirsty.  You have urinated only a small amount of very dark urine over 6-8 hours. Get help right away if:  You have symptoms of severe dehydration.  You cannot drink fluids without vomiting.  Your symptoms get worse with treatment.  You have a fever.  You have a severe headache.  You have vomiting or diarrhea that:  Gets worse.  Does not go away.  You have blood or green matter  (bile) in your vomit.  You have blood in your stool. This may cause stool to look black and tarry.  You have not urinated in 6-8 hours.  You faint.  Your heart rate while sitting still is over 100 beats a minute.  You have trouble breathing. This information is not intended to replace advice given to you by your health care provider. Make sure you discuss any questions you have with your health care provider. Document Released: 04/06/2005 Document Revised: 11/01/2015 Document Reviewed: 05/31/2015 Elsevier Interactive Patient Education  2017 [employer]  

## 2016-03-10 NOTE — Progress Notes (Signed)
Pt to receive 1L ivf's with61meq potassium today with symptom management per Dr. Lindi Adie. Urgent msg sent to scheduling. Beth, RN aware of pt IVF orders.

## 2016-03-16 ENCOUNTER — Other Ambulatory Visit: Payer: Self-pay | Admitting: *Deleted

## 2016-03-16 MED ORDER — ZOLPIDEM TARTRATE 5 MG PO TABS: 5.0000 mg | ORAL_TABLET | Freq: Every evening | ORAL | 1 refills | Status: DC | PRN

## 2016-03-23 NOTE — Assessment & Plan Note (Signed)
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 Genetic counseling: Normal did not reveal any abnormal mutations 2. Neoadjuvant chemotherapy. (Novinger Perjeta 6 cycles followed by Herceptin maintenance for one year) 3. Breast conserving surgery followed by 4. Adjuvant radiation therapy followed by 5. Adjuvant antiestrogen therapy -------------------------------------------------------------------------------------------------------------------------------------------------- Current treatment: TCH Perjeta, Today is cycle 5 Echocardiogram 12/26/2015: EF 60-65%  Chemotherapy toxicities: 1. Hospitalization: 01/08/2016 to 01/12/2016 for neutropenic fever (because her insurance company denied Neulasta for cycle 1): We will plan on giving Neulasta with cycle 2 and will get insurance authorization again. Cultures were negative. 2. leg edema: was onLasix 20 mg daily 01/13/2016, resolved. DiscontinuedLasix 3. Severe anemia: Hemoglobin 9.1. Baseline hemoglobin of 13 g. Related to chemotherapy.  4. Hypokalemia: due to persistentdiarrhea. Potassium today is 3.3, encouraged increase dietary potassium 5. Chemotherapy-induced diarrhea:Finally subsided 6. Chemotherapy-induced abdominal cramps: Bentyl has helped tremendously. 7. Chemotherapy-induced nausea and vomiting: Resolved with reduced dosage of chemotherapy 8. Insomnia: On Ambien CR 5 mg at bedtime. 9. Chemotherapy-induced peripheral neuropathy: Grade 1 10. Profound lack of taste and appetite: We talked about appetite stimulants and I elected not to pursue them  at this time.  Monitoring closely for chemotherapy toxicities atient will get fluids today with potassium. We will set her up for breast MRI an appointment with the surgery after cycle 6. Return to clinic in 3weeksforcycle 6 Patient wishes to follow up with surgery to discuss  different treatment options.he has been in conversations with her daughter about bilateral mastectomies. I discussed with her that it would be excessive therapy  But something that she could discuss with her surgeon. He is concerned that since she has implants,radiation to the breast may cause contractures.  I briefly discussed the role of  Herceptin and Perjeta maintenanceas well as the role of Neratinib in HER-2 positive breast cancer.

## 2016-03-24 ENCOUNTER — Ambulatory Visit (HOSPITAL_BASED_OUTPATIENT_CLINIC_OR_DEPARTMENT_OTHER): Payer: BLUE CROSS/BLUE SHIELD

## 2016-03-24 ENCOUNTER — Ambulatory Visit (HOSPITAL_BASED_OUTPATIENT_CLINIC_OR_DEPARTMENT_OTHER): Payer: BLUE CROSS/BLUE SHIELD | Admitting: Hematology and Oncology

## 2016-03-24 ENCOUNTER — Encounter: Payer: Self-pay | Admitting: Hematology and Oncology

## 2016-03-24 ENCOUNTER — Other Ambulatory Visit (HOSPITAL_BASED_OUTPATIENT_CLINIC_OR_DEPARTMENT_OTHER): Payer: BLUE CROSS/BLUE SHIELD

## 2016-03-24 ENCOUNTER — Encounter: Payer: Self-pay | Admitting: *Deleted

## 2016-03-24 ENCOUNTER — Ambulatory Visit: Payer: BLUE CROSS/BLUE SHIELD

## 2016-03-24 DIAGNOSIS — D6481 Anemia due to antineoplastic chemotherapy: Secondary | ICD-10-CM | POA: Diagnosis not present

## 2016-03-24 DIAGNOSIS — Z5111 Encounter for antineoplastic chemotherapy: Secondary | ICD-10-CM | POA: Diagnosis not present

## 2016-03-24 DIAGNOSIS — Z95828 Presence of other vascular implants and grafts: Secondary | ICD-10-CM

## 2016-03-24 DIAGNOSIS — G4700 Insomnia, unspecified: Secondary | ICD-10-CM

## 2016-03-24 DIAGNOSIS — Z170 Estrogen receptor positive status [ER+]: Secondary | ICD-10-CM

## 2016-03-24 DIAGNOSIS — G620 Drug-induced polyneuropathy: Secondary | ICD-10-CM | POA: Diagnosis not present

## 2016-03-24 DIAGNOSIS — C50211 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 female breast: Secondary | ICD-10-CM

## 2016-03-24 DIAGNOSIS — R630 Anorexia: Secondary | ICD-10-CM

## 2016-03-24 DIAGNOSIS — Z5112 Encounter for antineoplastic immunotherapy: Secondary | ICD-10-CM

## 2016-03-24 LAB — CBC WITH DIFFERENTIAL/PLATELET
BASO%: 0.7 % (ref 0.0–2.0)
BASOS ABS: 0 10*3/uL (ref 0.0–0.1)
EOS ABS: 0 10*3/uL (ref 0.0–0.5)
EOS%: 0 % (ref 0.0–7.0)
HEMATOCRIT: 27 % — AB (ref 34.8–46.6)
HGB: 9 g/dL — ABNORMAL LOW (ref 11.6–15.9)
LYMPH%: 32.4 % (ref 14.0–49.7)
MCH: 30.5 pg (ref 25.1–34.0)
MCHC: 33.3 g/dL (ref 31.5–36.0)
MCV: 91.5 fL (ref 79.5–101.0)
MONO#: 0.4 10*3/uL (ref 0.1–0.9)
MONO%: 12 % (ref 0.0–14.0)
NEUT%: 54.9 % (ref 38.4–76.8)
NEUTROS ABS: 1.6 10*3/uL (ref 1.5–6.5)
PLATELETS: 172 10*3/uL (ref 145–400)
RBC: 2.95 10*6/uL — AB (ref 3.70–5.45)
RDW: 18.5 % — ABNORMAL HIGH (ref 11.2–14.5)
WBC: 3 10*3/uL — AB (ref 3.9–10.3)
lymph#: 1 10*3/uL (ref 0.9–3.3)
nRBC: 0 % (ref 0–0)

## 2016-03-24 LAB — COMPREHENSIVE METABOLIC PANEL
ALT: 21 U/L (ref 0–55)
ANION GAP: 9 meq/L (ref 3–11)
AST: 25 U/L (ref 5–34)
Albumin: 3.6 g/dL (ref 3.5–5.0)
Alkaline Phosphatase: 65 U/L (ref 40–150)
BUN: 16.5 mg/dL (ref 7.0–26.0)
CALCIUM: 9 mg/dL (ref 8.4–10.4)
CHLORIDE: 108 meq/L (ref 98–109)
CO2: 24 meq/L (ref 22–29)
CREATININE: 0.7 mg/dL (ref 0.6–1.1)
Glucose: 91 mg/dl (ref 70–140)
Potassium: 3.3 mEq/L — ABNORMAL LOW (ref 3.5–5.1)
Sodium: 141 mEq/L (ref 136–145)
Total Bilirubin: 0.81 mg/dL (ref 0.20–1.20)
Total Protein: 5.9 g/dL — ABNORMAL LOW (ref 6.4–8.3)

## 2016-03-24 MED ORDER — ACETAMINOPHEN 325 MG PO TABS
ORAL_TABLET | ORAL | Status: AC
  Filled 2016-03-24: qty 2

## 2016-03-24 MED ORDER — DIPHENHYDRAMINE HCL 25 MG PO CAPS
ORAL_CAPSULE | ORAL | Status: AC
  Filled 2016-03-24: qty 1

## 2016-03-24 MED ORDER — SODIUM CHLORIDE 0.9 % IV SOLN
Freq: Once | INTRAVENOUS | Status: AC
  Administered 2016-03-24: 11:00:00 via INTRAVENOUS

## 2016-03-24 MED ORDER — PEGFILGRASTIM 6 MG/0.6ML ~~LOC~~ PSKT
6.0000 mg | PREFILLED_SYRINGE | Freq: Once | SUBCUTANEOUS | Status: AC
  Administered 2016-03-24: 6 mg via SUBCUTANEOUS
  Filled 2016-03-24: qty 0.6

## 2016-03-24 MED ORDER — ACETAMINOPHEN 325 MG PO TABS
650.0000 mg | ORAL_TABLET | Freq: Once | ORAL | Status: AC
  Administered 2016-03-24: 650 mg via ORAL

## 2016-03-24 MED ORDER — PALONOSETRON HCL INJECTION 0.25 MG/5ML
0.2500 mg | Freq: Once | INTRAVENOUS | Status: AC
  Administered 2016-03-24: 0.25 mg via INTRAVENOUS

## 2016-03-24 MED ORDER — SODIUM CHLORIDE 0.9 % IV SOLN
420.0000 mg | Freq: Once | INTRAVENOUS | Status: AC
  Administered 2016-03-24: 420 mg via INTRAVENOUS
  Filled 2016-03-24: qty 14

## 2016-03-24 MED ORDER — CARBOPLATIN CHEMO INJECTION 600 MG/60ML
438.8000 mg | Freq: Once | INTRAVENOUS | Status: AC
  Administered 2016-03-24: 440 mg via INTRAVENOUS
  Filled 2016-03-24: qty 44

## 2016-03-24 MED ORDER — TRASTUZUMAB CHEMO 150 MG IV SOLR
6.0000 mg/kg | Freq: Once | INTRAVENOUS | Status: AC
  Administered 2016-03-24: 378 mg via INTRAVENOUS
  Filled 2016-03-24: qty 18

## 2016-03-24 MED ORDER — SODIUM CHLORIDE 0.9% FLUSH
10.0000 mL | INTRAVENOUS | Status: DC | PRN
  Administered 2016-03-24: 10 mL
  Filled 2016-03-24: qty 10

## 2016-03-24 MED ORDER — TRASTUZUMAB CHEMO 150 MG IV SOLR: 6.0000 mg/kg | Freq: Once | INTRAVENOUS | Status: DC

## 2016-03-24 MED ORDER — PALONOSETRON HCL INJECTION 0.25 MG/5ML
INTRAVENOUS | Status: AC
  Filled 2016-03-24: qty 5

## 2016-03-24 MED ORDER — SODIUM CHLORIDE 0.9 % IV SOLN
Freq: Once | INTRAVENOUS | Status: AC
  Administered 2016-03-24: 11:00:00 via INTRAVENOUS
  Filled 2016-03-24: qty 5

## 2016-03-24 MED ORDER — SODIUM CHLORIDE 0.9 % IJ SOLN
10.0000 mL | INTRAMUSCULAR | Status: DC | PRN
  Filled 2016-03-24: qty 10

## 2016-03-24 MED ORDER — SODIUM CHLORIDE 0.9 % IV SOLN
65.0000 mg/m2 | Freq: Once | INTRAVENOUS | Status: AC
  Administered 2016-03-24: 120 mg via INTRAVENOUS
  Filled 2016-03-24: qty 12

## 2016-03-24 MED ORDER — HEPARIN SOD (PORK) LOCK FLUSH 100 UNIT/ML IV SOLN
500.0000 [IU] | Freq: Once | INTRAVENOUS | Status: AC | PRN
  Administered 2016-03-24: 500 [IU]
  Filled 2016-03-24: qty 5

## 2016-03-24 MED ORDER — DIPHENHYDRAMINE HCL 25 MG PO CAPS
25.0000 mg | ORAL_CAPSULE | Freq: Once | ORAL | Status: AC
  Administered 2016-03-24: 25 mg via ORAL

## 2016-03-24 NOTE — Progress Notes (Signed)
Patient Care Team: Kelton Pillar, MD as PCP - General (Family Medicine)  DIAGNOSIS:  Encounter Diagnosis  Name Primary?  . 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 breast in female, estrogen receptor positive ([place])     SUMMARY OF ONCOLOGIC HISTORY:   Breast cancer of upper-inner quadrant of right female breast ([place])   12/05/2015 Mammogram    Right breast mass 2:00 4 cm from nipple: 1.7 cm, T1c N0 stage IA; right breast 2:00 6 cm from nipple 1.3 cm mass, 7 mm satellite nodule between the 2 masses, T1 cN0 stage IA      12/06/2015 Initial Diagnosis    Right breast biopsy 2:00 6 cm from nipple: Grade 2-3 invasive ductal cancer, ER 90%, PR 2%, Ki-67 80%, HER-2 positive ratio 2.07; right biopsy 2:00 4 cm from nipple: IDC with DCIS, ER 95%, PR 10%, Ki-67 90%, HER-2 negative ratio 1.27      12/18/2015 Breast MRI    3 enhancing masses in the upper inner quadrant right breast spanning an area 3.7 cm (1.7 cm, 1 cm, 1.4 cm) no lymph node enlargement      12/31/2015 -  Neo-Adjuvant Chemotherapy    TCH Perjeta 6 cycles followed by Herceptin maintenance for 1 year      01/08/2016 - 01/12/2016 Hospital Admission    Neutropenic fever hospitalization (because patient did not receive Neulasta with cycle 1)      01/10/2016 Miscellaneous    Genetic testing was normal did not reveal any mutations       CHIEF COMPLIANT: Cycle 5 TCH Perjeta  INTERVAL HISTORY: Patricia Chandler is a 60 year old with above-mentioned history of right breast mass is currently on neoadjuvant chemotherapy with Flowella. Today is cycle 5 of treatment. She had nausea vomiting abdominal cramps and diarrhea related to treatments previously. She denies any fevers or chills. She also had mild peripheral neuropathy. Her taste and appetite have improved after the last cycle and she is very happy about that. She was able to enjoy her visit with her brother in [place].  REVIEW OF SYSTEMS:   Constitutional: Denies  fevers, chills or abnormal weight loss Eyes: Denies blurriness of vision Ears, nose, mouth, throat, and face: Denies mucositis or sore throat Respiratory: Denies cough, dyspnea or wheezes Cardiovascular: Denies palpitation, chest discomfort Gastrointestinal:  Nausea vomiting diarrhea and abdominal cramps Skin: Denies abnormal skin rashes Lymphatics: Denies new lymphadenopathy or easy bruising Neurological: Mild peripheral neuropathy Behavioral/Psych: Mood is stable, no new changes  Extremities: No lower extremity edema All other systems were reviewed with the patient and are negative.  I have reviewed the past medical history, past surgical history, social history and family history with the patient and they are unchanged from previous note.  ALLERGIES:  has No Known Allergies.  MEDICATIONS:  Current Outpatient Prescriptions  Medication Sig Dispense Refill  . ALPRAZolam (XANAX) 0.5 MG tablet Take 1 tablet (0.5 mg total) by mouth at bedtime as needed for anxiety. 60 tablet 0  . Ascorbic Acid (VITAMIN C PO) Take 1 tablet by mouth daily.     . calcium carbonate (OS-CAL) 600 MG TABS tablet Take 600 mg by mouth daily.    . Cholecalciferol (VITAMIN D3) 1000 units CAPS Take 4,000 Units by mouth daily.    Marland Kitchen dexamethasone (DECADRON) 4 MG tablet Take 1 tablet (4 mg total) by mouth daily. 1 tablet daily before chemotherapy and 1 tablet day after chemotherapy 15 tablet 0  . dicyclomine (BENTYL) 10 MG capsule Take 1 capsule (  10 mg total) by mouth 4 (four) times daily -  before meals and at bedtime. 60 capsule 1  . diphenoxylate-atropine (LOMOTIL) 2.5-0.025 MG tablet Take 1 tablet by mouth 4 (four) times daily as needed for diarrhea or loose stools. 120 tablet 2  . furosemide (LASIX) 20 MG tablet Take 1 tablet (20 mg total) by mouth daily. 30 tablet 0  . HYDROcodone-acetaminophen (NORCO) 10-325 MG tablet Take 1 tablet by mouth every 6 (six) hours as needed. 10 tablet 0  . ibuprofen (ADVIL,MOTRIN) 200  MG tablet Take 400-600 mg by mouth every 6 (six) hours as needed for moderate pain.    Marland Kitchen lidocaine-prilocaine (EMLA) cream Apply to affected area once 30 g 3  . loperamide (IMODIUM) 2 MG capsule Take 2 mg by mouth as needed for diarrhea or loose stools.    . magnesium 30 MG tablet Take 30 mg by mouth daily.     . Multiple Vitamin (MULTIVITAMIN WITH MINERALS) TABS tablet Take 1 tablet by mouth daily.    . Omega-3 Fatty Acids (FISH OIL PO) Take 1 capsule by mouth daily.     . ondansetron (ZOFRAN) 8 MG tablet Take 1 tablet (8 mg total) by mouth 2 (two) times daily as needed for refractory nausea / vomiting. Start on day 3 after chemo. 30 tablet 1  . pantoprazole (PROTONIX) 40 MG tablet Take 40 mg by mouth 2 (two) times daily.   0  . potassium chloride (K-DUR) 10 MEQ tablet Take 1 tablet (10 mEq total) by mouth daily. 3 tablet 0  . Probiotic Product (PROBIOTIC DAILY PO) Take 1 capsule by mouth daily.     . prochlorperazine (COMPAZINE) 10 MG tablet Take 1 tablet (10 mg total) by mouth every 6 (six) hours as needed (Nausea or vomiting). 30 tablet 1  . saccharomyces boulardii (FLORASTOR) 250 MG capsule Take 1 capsule (250 mg total) by mouth 2 (two) times daily.    Marland Kitchen zolpidem (AMBIEN) 5 MG tablet Take 1 tablet (5 mg total) by mouth at bedtime as needed for sleep. 30 tablet 1   No current facility-administered medications for this visit.     PHYSICAL EXAMINATION: ECOG PERFORMANCE STATUS: 1 - Symptomatic but completely ambulatory  Vitals:   03/24/16 0933  BP: 115/82  Pulse: 85  Resp: 18  Temp: 98.1 F (36.7 C)   Filed Weights   03/24/16 0933  Weight: 142 lb 12.8 oz (64.8 kg)    GENERAL:alert, no distress and comfortable SKIN: skin color, texture, turgor are normal, no rashes or significant lesions EYES: normal, Conjunctiva are pink and non-injected, sclera clear OROPHARYNX:no exudate, no erythema and lips, buccal mucosa, and tongue normal  NECK: supple, thyroid normal size, non-tender,  without nodularity LYMPH:  no palpable lymphadenopathy in the cervical, axillary or inguinal LUNGS: clear to auscultation and percussion with normal breathing effort HEART: regular rate & rhythm and no murmurs and no lower extremity edema ABDOMEN:abdomen soft, non-tender and normal bowel sounds MUSCULOSKELETAL:no cyanosis of digits and no clubbing  NEURO: alert & oriented x 3 with fluent speech, mild peripheral neuropathy EXTREMITIES: No lower extremity edema  LABORATORY DATA:  I have reviewed the data as listed   Chemistry      Component Value Date/Time   NA 141 03/24/2016 0850   K 3.3 (L) 03/24/2016 0850   CL 111 01/12/2016 0520   CO2 24 03/24/2016 0850   BUN 16.5 03/24/2016 0850   CREATININE 0.7 03/24/2016 0850      Component Value Date/Time  CALCIUM 9.0 03/24/2016 0850   ALKPHOS 65 03/24/2016 0850   AST 25 03/24/2016 0850   ALT 21 03/24/2016 0850   BILITOT 0.81 03/24/2016 0850       Lab Results  Component Value Date   WBC 3.0 (L) 03/24/2016   HGB 9.0 (L) 03/24/2016   HCT 27.0 (L) 03/24/2016   MCV 91.5 03/24/2016   PLT 172 03/24/2016   NEUTROABS 1.6 03/24/2016    ASSESSMENT & PLAN:  Breast cancer of upper-inner quadrant of right female breast ([facility]) Right breast biopsy 2:00 6 cm from nipple: Grade 2-3 invasive ductal cancer, ER 90%, PR 2%, Ki-67 80%, HER-2 positive ratio 2.07; right biopsy 2:00 4 cm from nipple: IDC with DCIS, ER 95%, PR 10%, Ki-67 90%, HER-2 negative ratio 1.27 Breast MRI08/30/2017: 3 enhancing masses in the upper inner quadrant right breast spanning an area 3.7 cm (1.7 cm, 1 cm, 1.4 cm) no lymph node enlargement Clinical stage: T2N0 (stage 2A)  Treatment plan: 1. Genetic counseling: Normal did not reveal any abnormal mutations 2. Neoadjuvant chemotherapy. ([place] Perjeta 6 cycles followed by Herceptin maintenance for one year) 3. Breast conserving surgery followed by 4. Adjuvant radiation therapy followed by 5. Adjuvant antiestrogen  therapy -------------------------------------------------------------------------------------------------------------------------------------------------- Current treatment: TCH Perjeta, Today is cycle 5 Echocardiogram 12/26/2015: EF 60-65%  Chemotherapy toxicities: 1. Hospitalization: 01/08/2016 to 01/12/2016 for neutropenic fever (because her insurance company denied Neulasta for cycle 1): We will plan on giving Neulasta with cycle 2 and will get insurance authorization again. Cultures were negative. 2. leg edema: was onLasix 20 mg daily 01/13/2016, resolved. DiscontinuedLasix 3. Severe anemia: Hemoglobin 9.1. Baseline hemoglobin of 13 g. Related to chemotherapy.  4. Hypokalemia: due to persistentdiarrhea. Potassium today is 3.3, encouraged increase dietary potassium 5. Chemotherapy-induced diarrhea:Finally subsided 6. Chemotherapy-induced abdominal cramps: Bentyl has helped tremendously. 7. Chemotherapy-induced nausea and vomiting: Resolved with reduced dosage of chemotherapy 8. Insomnia: On Ambien CR 5 mg at bedtime. 9. Chemotherapy-induced peripheral neuropathy: Grade 1 10. Profound lack of taste and appetite: Improved after cycle 4 of chemotherapy.  Monitoring closely for chemotherapy toxicities Patient will get fluids one week after chemotherapy  We will set her up for breast MRI an appointment with the surgery after cycle 6 on 04/15/2016. Return to clinic in 3weeksforcycle 6. I will see Dr. Burr Medico is able to see her  Patient wishes to follow up with surgery to discuss  different treatment options.he has been in conversations with her daughter about bilateral mastectomies. I discussed with her that it would be excessive therapy  But something that she could discuss with her surgeon. He is concerned that since she has implants,radiation to the breast may cause contractures.  I briefly discussed the role of  Herceptin and Perjeta maintenance as well as the role of Neratinib  in HER-2 positive breast cancer.  Our plan is to give her Herceptin and Perjeta maintenance  Orders Placed This Encounter  Procedures  . MR BREAST BILATERAL W WO CONTRAST    Standing Status:   Future    Standing Expiration Date:   05/24/2017    Order Specific Question:   If indicated for the ordered procedure, I authorize the administration of contrast media per Radiology protocol    Answer:   Yes    Order Specific Question:   Reason for Exam (SYMPTOM  OR DIAGNOSIS REQUIRED)    Answer:   Breast cancer post neoadj chemo    Order Specific Question:   Preferred imaging location?    Answer:   [id number] (table limit-550lbs)    Order Specific Question:   Does the patient have a pacemaker or implanted devices?    Answer:   No    Order Specific Question:   What is the patient's sedation requirement?    Answer:   No Sedation   The patient has a good understanding of the overall plan. she agrees with it. she will call with any problems that may develop before the next visit here.   Rulon Eisenmenger, MD 03/24/16

## 2016-03-24 NOTE — Patient Instructions (Signed)
[facility] Discharge Instructions for Patients Receiving Chemotherapy  Today you received the following chemotherapy agents Herceptin, Perjeta, Taxotere and Carboplatin.   To help prevent nausea and vomiting after your treatment, we encourage you to take your nausea medication.   If you develop nausea and vomiting that is not controlled by your nausea medication, call the clinic.   BELOW ARE SYMPTOMS THAT SHOULD BE REPORTED IMMEDIATELY:  *FEVER GREATER THAN 100.5 F  *CHILLS WITH OR WITHOUT FEVER  NAUSEA AND VOMITING THAT IS NOT CONTROLLED WITH YOUR NAUSEA MEDICATION  *UNUSUAL SHORTNESS OF BREATH  *UNUSUAL BRUISING OR BLEEDING  TENDERNESS IN MOUTH AND THROAT WITH OR WITHOUT PRESENCE OF ULCERS  *URINARY PROBLEMS  *BOWEL PROBLEMS  UNUSUAL RASH Items with * indicate a potential emergency and should be followed up as soon as possible.  Feel free to call the clinic you have any questions or concerns. The clinic phone number is [phone].  Please show the CHEMO ALERT CARD at check-in to the Emergency Department and triage nurse.   

## 2016-03-30 ENCOUNTER — Other Ambulatory Visit (HOSPITAL_BASED_OUTPATIENT_CLINIC_OR_DEPARTMENT_OTHER): Payer: BLUE CROSS/BLUE SHIELD

## 2016-03-30 ENCOUNTER — Encounter: Payer: Self-pay | Admitting: Nurse Practitioner

## 2016-03-30 ENCOUNTER — Ambulatory Visit: Payer: BLUE CROSS/BLUE SHIELD | Admitting: Nurse Practitioner

## 2016-03-30 ENCOUNTER — Ambulatory Visit: Payer: BLUE CROSS/BLUE SHIELD

## 2016-03-30 ENCOUNTER — Telehealth: Payer: Self-pay | Admitting: Nurse Practitioner

## 2016-03-30 ENCOUNTER — Ambulatory Visit (HOSPITAL_BASED_OUTPATIENT_CLINIC_OR_DEPARTMENT_OTHER): Payer: BLUE CROSS/BLUE SHIELD | Admitting: Nurse Practitioner

## 2016-03-30 ENCOUNTER — Telehealth: Payer: Self-pay | Admitting: *Deleted

## 2016-03-30 VITALS — BP 121/74 | HR 103 | Temp 98.3°F | Resp 17 | Ht 63.0 in | Wt 136.2 lb

## 2016-03-30 DIAGNOSIS — C50211 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 female breast: Secondary | ICD-10-CM | POA: Diagnosis not present

## 2016-03-30 DIAGNOSIS — R21 Rash and other nonspecific skin eruption: Secondary | ICD-10-CM

## 2016-03-30 DIAGNOSIS — E860 Dehydration: Secondary | ICD-10-CM | POA: Diagnosis not present

## 2016-03-30 DIAGNOSIS — D6959 Other secondary thrombocytopenia: Secondary | ICD-10-CM

## 2016-03-30 DIAGNOSIS — Z170 Estrogen receptor positive status [ER+]: Secondary | ICD-10-CM

## 2016-03-30 DIAGNOSIS — Z95828 Presence of other vascular implants and grafts: Secondary | ICD-10-CM

## 2016-03-30 DIAGNOSIS — T451X5A Adverse effect of antineoplastic and immunosuppressive drugs, initial encounter: Secondary | ICD-10-CM

## 2016-03-30 DIAGNOSIS — E876 Hypokalemia: Secondary | ICD-10-CM

## 2016-03-30 LAB — CBC WITH DIFFERENTIAL/PLATELET
BASO%: 0 % (ref 0.0–2.0)
Basophils Absolute: 0 10e3/uL (ref 0.0–0.1)
EOS%: 0.3 % (ref 0.0–7.0)
Eosinophils Absolute: 0 10e3/uL (ref 0.0–0.5)
HCT: 26.5 % — ABNORMAL LOW (ref 34.8–46.6)
HGB: 9 g/dL — ABNORMAL LOW (ref 11.6–15.9)
LYMPH%: 31.9 % (ref 14.0–49.7)
MCH: 31.3 pg (ref 25.1–34.0)
MCHC: 34 g/dL (ref 31.5–36.0)
MCV: 92 fL (ref 79.5–101.0)
MONO#: 0.3 10e3/uL (ref 0.1–0.9)
MONO%: 8.7 % (ref 0.0–14.0)
NEUT#: 2 10e3/uL (ref 1.5–6.5)
NEUT%: 59.1 % (ref 38.4–76.8)
Platelets: 78 10e3/uL — ABNORMAL LOW (ref 145–400)
RBC: 2.88 10e6/uL — ABNORMAL LOW (ref 3.70–5.45)
RDW: 16.7 % — ABNORMAL HIGH (ref 11.2–14.5)
WBC: 3.3 10e3/uL — ABNORMAL LOW (ref 3.9–10.3)
lymph#: 1.1 10e3/uL (ref 0.9–3.3)
nRBC: 0 % (ref 0–0)

## 2016-03-30 LAB — COMPREHENSIVE METABOLIC PANEL WITH GFR
ALT: 39 U/L (ref 0–55)
AST: 29 U/L (ref 5–34)
Albumin: 3.8 g/dL (ref 3.5–5.0)
Alkaline Phosphatase: 93 U/L (ref 40–150)
Anion Gap: 10 meq/L (ref 3–11)
BUN: 21.5 mg/dL (ref 7.0–26.0)
CO2: 25 meq/L (ref 22–29)
Calcium: 9.4 mg/dL (ref 8.4–10.4)
Chloride: 101 meq/L (ref 98–109)
Creatinine: 0.8 mg/dL (ref 0.6–1.1)
EGFR: 84 ml/min/1.73 m2 — ABNORMAL LOW
Glucose: 100 mg/dL (ref 70–140)
Potassium: 3.4 meq/L — ABNORMAL LOW (ref 3.5–5.1)
Sodium: 136 meq/L (ref 136–145)
Total Bilirubin: 0.93 mg/dL (ref 0.20–1.20)
Total Protein: 6.5 g/dL (ref 6.4–8.3)

## 2016-03-30 MED ORDER — HEPARIN SOD (PORK) LOCK FLUSH 100 UNIT/ML IV SOLN
500.0000 [IU] | Freq: Once | INTRAVENOUS | Status: DC | PRN
  Filled 2016-03-30: qty 5

## 2016-03-30 MED ORDER — HEPARIN SOD (PORK) LOCK FLUSH 100 UNIT/ML IV SOLN
500.0000 [IU] | Freq: Once | INTRAVENOUS | Status: AC | PRN
  Administered 2016-03-30: 500 [IU] via INTRAVENOUS
  Filled 2016-03-30: qty 5

## 2016-03-30 MED ORDER — SODIUM CHLORIDE 0.9 % IJ SOLN
10.0000 mL | INTRAMUSCULAR | Status: DC | PRN
  Administered 2016-03-30: 10 mL via INTRAVENOUS
  Filled 2016-03-30: qty 10

## 2016-03-30 MED ORDER — SODIUM CHLORIDE 0.9 % IV SOLN
Freq: Once | INTRAVENOUS | Status: AC
  Administered 2016-03-30: 13:00:00 via INTRAVENOUS

## 2016-03-30 NOTE — Assessment & Plan Note (Signed)
Patient received cycle 5 of her Taxotere/carboplatin/Herceptin/Perjeta chemotherapy on 03/24/2016.  Patient is scheduled for additional IV fluid rehydration tomorrow; but the cancer Center nurse will call to see if patient actually feels dehydrated.  If patient is feeling well.-We'll cancel IV fluid rehydration for tomorrow.  Patient is scheduled for labs, flush, and her last cycle of chemotherapy on Monday, 01/15/2016.  She will then be scheduled for restaging scan; and her future surgery.

## 2016-03-30 NOTE — Assessment & Plan Note (Signed)
Patient states she's had decreased appetite and decreased oral intake.  She feels slightly dehydrated today; will receive IV fluid rehydration.

## 2016-03-30 NOTE — Assessment & Plan Note (Signed)
Patient received cycle 5 of chemotherapy on 03/24/2016.  Blood counts obtained today reveal a WBC 3.3, ANC 2.0, hemoglobin 9.0, platelet count is down to 78.  Patient denies any worsening issues with either easy bleeding or bruising.  Will continue to monitor closely.

## 2016-03-30 NOTE — Telephone Encounter (Signed)
Patient received IV fluid rehydration today; but had previously had IV fluid rehydration scheduled for tomorrow 03/31/2016 as well.  Since patient received IV fluid rehydration today.-We will hold additional IV fluid rehydration tomorrow.  Left a detailed message with the patient's voicemail advising the patient to call first thing in the morning if she decides she wants additional IV fluid rehydration.

## 2016-03-30 NOTE — Assessment & Plan Note (Signed)
Potassium was 3.4 today.  Patient has a history of chronic mild hyperkalemia.  She was encouraged to push potassium in her diet is much as possible.  Will continue to monitor.

## 2016-03-30 NOTE — Assessment & Plan Note (Signed)
Patient states that she developed a rash to her mid back approximately 3 weeks ago.  She denies any pain to the rash; but states that the rash does itch.  She states that she took a screening loofah to her back just yesterday; and now it has scabbed.  She denies any other new symptoms.  She denies recent fevers or chills.  Exam today reveals multiple, scattered scabbed lesions to mid back region.  Differential diagnosis for the rash did include shingles; but there was no pain whatsoever to the rash at any time.  Also, the rash does cross midline.  The rash could also be some mild folliculitis as well.  Patient was advised to try Aquaphor to the site; and can also try hydrocortisone cream topically.  Patient is scheduled to meet with her dermatologist this coming Wednesday, 04/01/2016 for a mole check; and will have her rash re-evaluated at that point as well.

## 2016-03-30 NOTE — Telephone Encounter (Signed)
Pt with feelings if being "very weak, dizzy and muscle weakness as well as rash on back". Informed pt to come in to symptom management clinic to see Cyndee and for IVF. Called Cyndee with pt complaints as well as [place].

## 2016-03-30 NOTE — Progress Notes (Signed)
SYMPTOM MANAGEMENT CLINIC    Chief Complaint: Rash, dehydration  HPI:  Patricia Chandler 60 y.o. female diagnosed with breast cancer.  Currently undergoing Taxotere/carboplatin/Herceptin/Perjeta chemotherapy regimen.     Breast cancer of upper-inner quadrant of right female breast ([place])   12/05/2015 Mammogram    Right breast mass 2:00 4 cm from nipple: 1.7 cm, T1c N0 stage IA; right breast 2:00 6 cm from nipple 1.3 cm mass, 7 mm satellite nodule between the 2 masses, T1 cN0 stage IA      12/06/2015 Initial Diagnosis    Right breast biopsy 2:00 6 cm from nipple: Grade 2-3 invasive ductal cancer, ER 90%, PR 2%, Ki-67 80%, HER-2 positive ratio 2.07; right biopsy 2:00 4 cm from nipple: IDC with DCIS, ER 95%, PR 10%, Ki-67 90%, HER-2 negative ratio 1.27      12/18/2015 Breast MRI    3 enhancing masses in the upper inner quadrant right breast spanning an area 3.7 cm (1.7 cm, 1 cm, 1.4 cm) no lymph node enlargement      12/31/2015 -  Neo-Adjuvant Chemotherapy    TCH Perjeta 6 cycles followed by Herceptin maintenance for 1 year      01/08/2016 - 01/12/2016 Hospital Admission    Neutropenic fever hospitalization (because patient did not receive Neulasta with cycle 1)      01/10/2016 Miscellaneous    Genetic testing was normal did not reveal any mutations       Review of Systems  Constitutional: Positive for malaise/fatigue and weight loss.  Skin: Positive for itching and rash.  All other systems reviewed and are negative.   Past Medical History:  Diagnosis Date  . Breast cancer ([place])   . Diverticulosis   . Family history of breast cancer   . Genital herpes   . Heart murmur    had ECHO 12 yrs ago was neg  . Internal hemorrhoid     Past Surgical History:  Procedure Laterality Date  . BREAST ENHANCEMENT SURGERY    . BREAST LUMPECTOMY     right/BENIGN  . PORTACATH PLACEMENT N/A 12/30/2015   Procedure: INSERTION PORT-A-CATH WITH [place];  Surgeon: Rolm Bookbinder, MD;   Location: [facility];  Service: General;  Laterality: N/A;    has Rectal bleeding; Internal hemorrhoid; Loose stools; Family history of breast cancer; Breast cancer of upper-inner quadrant of right female breast ([place]); Port catheter in place; Dehydration; Sepsis ([place]); GERD (gastroesophageal reflux disease); Genetic testing; Rash; Hypokalemia; and Chemotherapy-induced thrombocytopenia on her problem list.    has No Known Allergies.    Medication List       Accurate as of 03/30/16  4:07 PM. Always use your most recent med list.          ALPRAZolam 0.5 MG tablet Commonly known as:  XANAX Take 1 tablet (0.5 mg total) by mouth at bedtime as needed for anxiety.   calcium carbonate 600 MG Tabs tablet Commonly known as:  OS-CAL Take 600 mg by mouth daily.   dexamethasone 4 MG tablet Commonly known as:  DECADRON Take 1 tablet (4 mg total) by mouth daily. 1 tablet daily before chemotherapy and 1 tablet day after chemotherapy   dicyclomine 10 MG capsule Commonly known as:  BENTYL Take 1 capsule (10 mg total) by mouth 4 (four) times daily -  before meals and at bedtime.   diphenoxylate-atropine 2.5-0.025 MG tablet Commonly known as:  LOMOTIL Take 1 tablet by mouth 4 (four) times daily as needed for diarrhea or  loose stools.   FISH OIL PO Take 1 capsule by mouth daily.   HYDROcodone-acetaminophen 10-325 MG tablet Commonly known as:  NORCO Take 1 tablet by mouth every 6 (six) hours as needed.   ibuprofen 200 MG tablet Commonly known as:  ADVIL,MOTRIN Take 400-600 mg by mouth every 6 (six) hours as needed for moderate pain.   lidocaine-prilocaine cream Commonly known as:  EMLA Apply to affected area once   loperamide 2 MG capsule Commonly known as:  IMODIUM Take 2 mg by mouth as needed for diarrhea or loose stools.   magnesium 30 MG tablet Take 30 mg by mouth daily.   multivitamin with minerals Tabs tablet Take 1 tablet by mouth daily.   ondansetron 8 MG  tablet Commonly known as:  ZOFRAN Take 1 tablet (8 mg total) by mouth 2 (two) times daily as needed for refractory nausea / vomiting. Start on day 3 after chemo.   pantoprazole 40 MG tablet Commonly known as:  PROTONIX Take 40 mg by mouth 2 (two) times daily.   PROBIOTIC DAILY PO Take 1 capsule by mouth daily.   prochlorperazine 10 MG tablet Commonly known as:  COMPAZINE Take 1 tablet (10 mg total) by mouth every 6 (six) hours as needed (Nausea or vomiting).   saccharomyces boulardii 250 MG capsule Commonly known as:  FLORASTOR Take 1 capsule (250 mg total) by mouth 2 (two) times daily.   VITAMIN C PO Take 1 tablet by mouth daily.   Vitamin D3 1000 units Caps Take 4,000 Units by mouth daily.   zolpidem 5 MG tablet Commonly known as:  AMBIEN Take 1 tablet (5 mg total) by mouth at bedtime as needed for sleep.        PHYSICAL EXAMINATION  Oncology Vitals 03/30/2016 03/24/2016  Height 160 cm -  Weight 61.78 kg 64.774 kg  Weight (lbs) 136 lbs 3 oz 142 lbs 13 oz  BMI (kg/m2) 24.13 kg/m2 25.3 kg/m2  Temp 98.3 98.1  Pulse 103 85  Resp 17 18  SpO2 100 100  BSA (m2) 1.66 m2 1.7 m2   BP Readings from Last 2 Encounters:  03/30/16 121/74  03/24/16 115/82    Physical Exam  Constitutional: She is oriented to person, place, and time and well-developed, well-nourished, and in no distress.  HENT:  Head: Normocephalic and atraumatic.  Mouth/Throat: Oropharynx is clear and moist.  Eyes: Conjunctivae and EOM are normal. Pupils are equal, round, and reactive to light. Right eye exhibits no discharge. Left eye exhibits no discharge. No scleral icterus.  Neck: Normal range of motion.  Pulmonary/Chest: Effort normal. No respiratory distress.  Musculoskeletal: Normal range of motion.  Neurological: She is alert and oriented to person, place, and time. Gait normal.  Skin: Skin is warm and dry. Rash noted. No erythema. No pallor.  Scattered, scabbed rash to central back.    Psychiatric: Affect normal.  Nursing note and vitals reviewed.   LABORATORY DATA:. Appointment on 03/30/2016  Component Date Value Ref Range Status  . WBC 03/30/2016 3.3* 3.9 - 10.3 10e3/uL Final  . NEUT# 03/30/2016 2.0  1.5 - 6.5 10e3/uL Final  . HGB 03/30/2016 9.0* 11.6 - 15.9 g/dL Final  . HCT 03/30/2016 26.5* 34.8 - 46.6 % Final  . Platelets 03/30/2016 78* 145 - 400 10e3/uL Final  . MCV 03/30/2016 92.0  79.5 - 101.0 fL Final  . MCH 03/30/2016 31.3  25.1 - 34.0 pg Final  . MCHC 03/30/2016 34.0  31.5 - 36.0 g/dL Final  .  RBC 03/30/2016 2.88* 3.70 - 5.45 10e6/uL Final  . RDW 03/30/2016 16.7* 11.2 - 14.5 % Final  . lymph# 03/30/2016 1.1  0.9 - 3.3 10e3/uL Final  . MONO# 03/30/2016 0.3  0.1 - 0.9 10e3/uL Final  . Eosinophils Absolute 03/30/2016 0.0  0.0 - 0.5 10e3/uL Final  . Basophils Absolute 03/30/2016 0.0  0.0 - 0.1 10e3/uL Final  . NEUT% 03/30/2016 59.1  38.4 - 76.8 % Final  . LYMPH% 03/30/2016 31.9  14.0 - 49.7 % Final  . MONO% 03/30/2016 8.7  0.0 - 14.0 % Final  . EOS% 03/30/2016 0.3  0.0 - 7.0 % Final  . BASO% 03/30/2016 0.0  0.0 - 2.0 % Final  . nRBC 03/30/2016 0  0 - 0 % Final  . Sodium 03/30/2016 136  136 - 145 mEq/L Final  . Potassium 03/30/2016 3.4* 3.5 - 5.1 mEq/L Final  . Chloride 03/30/2016 101  98 - 109 mEq/L Final  . CO2 03/30/2016 25  22 - 29 mEq/L Final  . Glucose 03/30/2016 100  70 - 140 mg/dl Final  . BUN 03/30/2016 21.5  7.0 - 26.0 mg/dL Final  . Creatinine 03/30/2016 0.8  0.6 - 1.1 mg/dL Final  . Total Bilirubin 03/30/2016 0.93  0.20 - 1.20 mg/dL Final  . Alkaline Phosphatase 03/30/2016 93  40 - 150 U/L Final  . AST 03/30/2016 29  5 - 34 U/L Final  . ALT 03/30/2016 39  0 - 55 U/L Final  . Total Protein 03/30/2016 6.5  6.4 - 8.3 g/dL Final  . Albumin 03/30/2016 3.8  3.5 - 5.0 g/dL Final  . Calcium 03/30/2016 9.4  8.4 - 10.4 mg/dL Final  . Anion Gap 03/30/2016 10  3 - 11 mEq/L Final  . EGFR 03/30/2016 84* >90 ml/min/1.73 m2 Final   Rash to back:      RADIOGRAPHIC STUDIES: No results found.  ASSESSMENT/PLAN:    Rash Patient states that she developed a rash to her mid back approximately 3 weeks ago.  She denies any pain to the rash; but states that the rash does itch.  She states that she took a screening loofah to her back just yesterday; and now it has scabbed.  She denies any other new symptoms.  She denies recent fevers or chills.  Exam today reveals multiple, scattered scabbed lesions to mid back region.  Differential diagnosis for the rash did include shingles; but there was no pain whatsoever to the rash at any time.  Also, the rash does cross midline.  The rash could also be some mild folliculitis as well.  Patient was advised to try Aquaphor to the site; and can also try hydrocortisone cream topically.  Patient is scheduled to meet with her dermatologist this coming Wednesday, 04/01/2016 for a mole check; and will have her rash re-evaluated at that point as well.  Hypokalemia Potassium was 3.4 today.  Patient has a history of chronic mild hyperkalemia.  She was encouraged to push potassium in her diet is much as possible.  Will continue to monitor.  Dehydration Patient states she's had decreased appetite and decreased oral intake.  She feels slightly dehydrated today; will receive IV fluid rehydration.  Chemotherapy-induced thrombocytopenia Patient received cycle 5 of chemotherapy on 03/24/2016.  Blood counts obtained today reveal a WBC 3.3, ANC 2.0, hemoglobin 9.0, platelet count is down to 78.  Patient denies any worsening issues with either easy bleeding or bruising.  Will continue to monitor closely.  Breast cancer of upper-inner quadrant of right  female breast [facility]) Patient received cycle 5 of her Taxotere/carboplatin/Herceptin/Perjeta chemotherapy on 03/24/2016.  Patient is scheduled for additional IV fluid rehydration tomorrow; but the cancer Center nurse will call to see if patient actually feels dehydrated.  If  patient is feeling well.-We'll cancel IV fluid rehydration for tomorrow.  Patient is scheduled for labs, flush, and her last cycle of chemotherapy on Monday, 01/15/2016.  She will then be scheduled for restaging scan; and her future surgery.   Patient stated understanding of all instructions; and was in agreement with this plan of care. The patient knows to call the clinic with any problems, questions or concerns.   Total time spent with patient was 25 minutes;  with greater than 75 percent of that time spent in face to face counseling regarding patient's symptoms,  and coordination of care and follow up.  Disclaimer:This dictation was prepared with Dragon/digital dictation along with [employer]. Any transcriptional errors that result from this process are unintentional.  Drue Second, NP 03/30/2016

## 2016-03-30 NOTE — Patient Instructions (Signed)
Dehydration, Adult Dehydration is a condition in which there is not enough fluid or water in the body. This happens when you lose more fluids than you take in. Important organs, such as the kidneys, brain, and heart, cannot function without a proper amount of fluids. Any loss of fluids from the body can lead to dehydration. Dehydration can range from mild to severe. This condition should be treated right away to prevent it from becoming severe. What are the causes? This condition may be caused by:  Vomiting.  Diarrhea.  Excessive sweating, such as from heat exposure or exercise.  Not drinking enough fluid, especially:  When ill.  While doing activity that requires a lot of energy.  Excessive urination.  Fever.  Infection.  Certain medicines, such as medicines that cause the body to lose excess fluid (diuretics).  Inability to access safe drinking water.  Reduced physical ability to get adequate water and food. What increases the risk? This condition is more likely to develop in people:  Who have a poorly controlled long-term (chronic) illness, such as diabetes, heart disease, or kidney disease.  Who are age 65 or older.  Who are disabled.  Who live in a place with high altitude.  Who play endurance sports. What are the signs or symptoms? Symptoms of mild dehydration may include:   Thirst.  Dry lips.  Slightly dry mouth.  Dry, warm skin.  Dizziness. Symptoms of moderate dehydration may include:   Very dry mouth.  Muscle cramps.  Dark urine. Urine may be the color of tea.  Decreased urine production.  Decreased tear production.  Heartbeat that is irregular or faster than normal (palpitations).  Headache.  Light-headedness, especially when you stand up from a sitting position.  Fainting (syncope). Symptoms of severe dehydration may include:   Changes in skin, such as:  Cold and clammy skin.  Blotchy (mottled) or pale skin.  Skin that does  not quickly return to normal after being lightly pinched and released (poor skin turgor).  Changes in body fluids, such as:  Extreme thirst.  No tear production.  Inability to sweat when body temperature is high, such as in hot weather.  Very little urine production.  Changes in vital signs, such as:  Weak pulse.  Pulse that is more than 100 beats a minute when sitting still.  Rapid breathing.  Low blood pressure.  Other changes, such as:  Sunken eyes.  Cold hands and feet.  Confusion.  Lack of energy (lethargy).  Difficulty waking up from sleep.  Short-term weight loss.  Unconsciousness. How is this diagnosed? This condition is diagnosed based on your symptoms and a physical exam. Blood and urine tests may be done to help confirm the diagnosis. How is this treated? Treatment for this condition depends on the severity. Mild or moderate dehydration can often be treated at home. Treatment should be started right away. Do not wait until dehydration becomes severe. Severe dehydration is an emergency and it needs to be treated in a hospital. Treatment for mild dehydration may include:   Drinking more fluids.  Replacing salts and minerals in your blood (electrolytes) that you may have lost. Treatment for moderate dehydration may include:   Drinking an oral rehydration solution (ORS). This is a drink that helps you replace fluids and electrolytes (rehydrate). It can be found at pharmacies and retail stores. Treatment for severe dehydration may include:   Receiving fluids through an IV tube.  Receiving an electrolyte solution through a feeding tube that is   passed through your nose and into your stomach (nasogastric tube, or NG tube).  Correcting any abnormalities in electrolytes.  Treating the underlying cause of dehydration. Follow these instructions at home:  If directed by your health care provider, drink an ORS:  Make an ORS by following instructions on the  package.  Start by drinking small amounts, about  cup (120 mL) every 5-10 minutes.  Slowly increase how much you drink until you have taken the amount recommended by your health care provider.  Drink enough clear fluid to keep your urine clear or pale yellow. If you were told to drink an ORS, finish the ORS first, then start slowly drinking other clear fluids. Drink fluids such as:  Water. Do not drink only water. Doing that can lead to having too little salt (sodium) in the body (hyponatremia).  Ice chips.  Fruit juice that you have added water to (diluted fruit juice).  Low-calorie sports drinks.  Avoid:  Alcohol.  Drinks that contain a lot of sugar. These include high-calorie sports drinks, fruit juice that is not diluted, and soda.  Caffeine.  Foods that are greasy or contain a lot of fat or sugar.  Take over-the-counter and prescription medicines only as told by your health care provider.  Do not take sodium tablets. This can lead to having too much sodium in the body (hypernatremia).  Eat foods that contain a healthy balance of electrolytes, such as bananas, oranges, potatoes, tomatoes, and spinach.  Keep all follow-up visits as told by your health care provider. This is important. Contact a health care provider if:  You have abdominal pain that:  Gets worse.  Stays in one area (localizes).  You have a rash.  You have a stiff neck.  You are more irritable than usual.  You are sleepier or more difficult to wake up than usual.  You feel weak or dizzy.  You feel very thirsty.  You have urinated only a small amount of very dark urine over 6-8 hours. Get help right away if:  You have symptoms of severe dehydration.  You cannot drink fluids without vomiting.  Your symptoms get worse with treatment.  You have a fever.  You have a severe headache.  You have vomiting or diarrhea that:  Gets worse.  Does not go away.  You have blood or green matter  (bile) in your vomit.  You have blood in your stool. This may cause stool to look black and tarry.  You have not urinated in 6-8 hours.  You faint.  Your heart rate while sitting still is over 100 beats a minute.  You have trouble breathing. This information is not intended to replace advice given to you by your health care provider. Make sure you discuss any questions you have with your health care provider. Document Released: 04/06/2005 Document Revised: 11/01/2015 Document Reviewed: 05/31/2015 Elsevier Interactive Patient Education  2017 [employer]  

## 2016-03-31 ENCOUNTER — Ambulatory Visit: Payer: BLUE CROSS/BLUE SHIELD

## 2016-04-01 ENCOUNTER — Encounter: Payer: Self-pay | Admitting: *Deleted

## 2016-04-02 ENCOUNTER — Ambulatory Visit (HOSPITAL_COMMUNITY)
Admission: RE | Admit: 2016-04-02 | Discharge: 2016-04-02 | Disposition: A | Discharge status: Home / Self Care | Payer: BLUE CROSS/BLUE SHIELD | Source: Ambulatory Visit | Attending: Internal Medicine | Admitting: Internal Medicine

## 2016-04-02 ENCOUNTER — Ambulatory Visit (HOSPITAL_BASED_OUTPATIENT_CLINIC_OR_DEPARTMENT_OTHER)
Admission: RE | Admit: 2016-04-02 | Discharge: 2016-04-02 | Disposition: A | Discharge status: Home / Self Care | Payer: BLUE CROSS/BLUE SHIELD | Source: Ambulatory Visit | Attending: Internal Medicine | Admitting: Internal Medicine

## 2016-04-02 VITALS — BP 102/68 | HR 102 | Wt 135.2 lb

## 2016-04-02 DIAGNOSIS — C50211 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 female breast: Secondary | ICD-10-CM

## 2016-04-02 NOTE — Patient Instructions (Signed)
Follow up 3 months with echocardiogram and appointment with Dr. Haroldine Laws.  Merry Christmas and Happy New Year!

## 2016-04-02 NOTE — Addendum Note (Signed)
Encounter addended by: Effie Berkshire, RN on: 04/02/2016 11:04 AM<BR>    Actions taken: Order list changed, Diagnosis association updated, Sign clinical note

## 2016-04-02 NOTE — Progress Notes (Signed)
  Echocardiogram 2D Echocardiogram has been performed.  Donata Clay 04/02/2016, 10:16 AM

## 2016-04-02 NOTE — Progress Notes (Signed)
CARDIO-ONCOLOGY CONSULT NOTE  Patient Care Team: Kelton Pillar, MD as PCP - General (Family Medicine)  Referring oncologist: Lindi Adie   HISTORY OF PRESENTING ILLNESS:  Patricia Chandler is a 60 y/o woman with no cardiac history undergoing treatment for right breast cancer. Referred by Dr. Lindi Adie for enrollment into the [facility].   SUMMARY OF ONCOLOGIC HISTORY:   Breast cancer of upper-inner quadrant of right female breast ([place])   12/05/2015 Mammogram    Right breast mass 2:00 4 cm from nipple: 1.7 cm, T1c N0 stage IA; right breast 2:00 6 cm from nipple 1.3 cm mass, 7 mm satellite nodule between the 2 masses, T1 cN0 stage IA      12/06/2015 Initial Diagnosis    Right breast biopsy 2:00 6 cm from nipple: Grade 2-3 invasive ductal cancer, ER 90%, PR 2%, Ki-67 80%, HER-2 positive ratio 2.07; right biopsy 2:00 4 cm from nipple: IDC with DCIS, ER 95%, PR 10%, Ki-67 90%, HER-2 negative ratio 1.27      12/18/2015 Breast MRI    3 enhancing masses in the upper inner quadrant right breast spanning an area 3.7 cm (1.7 cm, 1 cm, 1.4 cm) no lymph node enlargement      12/31/2015 -  Neo-Adjuvant Chemotherapy    TCH Perjeta 6 cycles followed by Herceptin maintenance for 1 year      01/08/2016 - 01/12/2016 Hospital Admission    Neutropenic fever hospitalization (because patient did not receive Neulasta with cycle 1)      01/10/2016 Miscellaneous    Genetic testing was normal did not reveal any mutations      Subjective   Has gotten 5/6 cycles of chemo. Feel "crappy". Says last round much harder than previous. Poor appetite. Losing weight. No HF symptoms.   Echo (12/26/15) EF 60-65% GLS -22.5% Lateral s' 11.4 cm/s.  Echo 04/02/16 EF 60-65% GLS - 21.3% Lateral s' 10.8 cm/s (reviewed personally)  MEDICAL HISTORY:  Past Medical History:  Diagnosis Date  . Breast cancer ([place])   . Diverticulosis   . Family history of breast cancer   . Genital herpes   . Heart murmur    had  ECHO 12 yrs ago was neg  . Internal hemorrhoid     SURGICAL HISTORY: Past Surgical History:  Procedure Laterality Date  . BREAST ENHANCEMENT SURGERY    . BREAST LUMPECTOMY     right/BENIGN  . PORTACATH PLACEMENT N/A 12/30/2015   Procedure: INSERTION PORT-A-CATH WITH [place];  Surgeon: Rolm Bookbinder, MD;  Location: [facility];  Service: General;  Laterality: N/A;    SOCIAL HISTORY: Social History   Social History  . Marital status: Married    Spouse name: N/A  . Number of children: 2  . Years of education: N/A   Occupational History  . medical receptionist    Social History Main Topics  . Smoking status: Never Smoker  . Smokeless tobacco: Never Used  . Alcohol use 1.8 oz/week    3 Glasses of wine per week     Comment: 2 drinks per day  . Drug use: No  . Sexual activity: Not on file   Other Topics Concern  . Not on file   Social History Narrative  . No narrative on file    FAMILY HISTORY: Family History  Problem Relation Age of Onset  . Breast cancer Maternal Aunt     dx in her 71s  . Stroke Father   . Hypertension Father   . Pulmonary  fibrosis Mother   . Breast cancer Paternal Aunt 78  . Cancer Paternal Grandmother     abdominal; cervical vs uterine dx in her 53s  . Parkinson's disease Maternal Grandfather     ALLERGIES:  has No Known Allergies.  MEDICATIONS:  Current Outpatient Prescriptions  Medication Sig Dispense Refill  . ALPRAZolam (XANAX) 0.5 MG tablet Take 1 tablet (0.5 mg total) by mouth at bedtime as needed for anxiety. 60 tablet 0  . Ascorbic Acid (VITAMIN C PO) Take 1 tablet by mouth daily.     . calcium carbonate (OS-CAL) 600 MG TABS tablet Take 600 mg by mouth daily.    . Cholecalciferol (VITAMIN D3) 1000 units CAPS Take 4,000 Units by mouth daily.    Marland Kitchen dexamethasone (DECADRON) 4 MG tablet Take 1 tablet (4 mg total) by mouth daily. 1 tablet daily before chemotherapy and 1 tablet day after chemotherapy 15 tablet 0  .  dicyclomine (BENTYL) 10 MG capsule Take 1 capsule (10 mg total) by mouth 4 (four) times daily -  before meals and at bedtime. 60 capsule 1  . diphenoxylate-atropine (LOMOTIL) 2.5-0.025 MG tablet Take 1 tablet by mouth 4 (four) times daily as needed for diarrhea or loose stools. 120 tablet 2  . HYDROcodone-acetaminophen (NORCO) 10-325 MG tablet Take 1 tablet by mouth every 6 (six) hours as needed. 10 tablet 0  . ibuprofen (ADVIL,MOTRIN) 200 MG tablet Take 400-600 mg by mouth every 6 (six) hours as needed for moderate pain.    Marland Kitchen lidocaine-prilocaine (EMLA) cream Apply to affected area once 30 g 3  . loperamide (IMODIUM) 2 MG capsule Take 2 mg by mouth as needed for diarrhea or loose stools.    . magnesium 30 MG tablet Take 30 mg by mouth daily.     . Multiple Vitamin (MULTIVITAMIN WITH MINERALS) TABS tablet Take 1 tablet by mouth daily.    . Omega-3 Fatty Acids (FISH OIL PO) Take 1 capsule by mouth daily.     . ondansetron (ZOFRAN) 8 MG tablet Take 1 tablet (8 mg total) by mouth 2 (two) times daily as needed for refractory nausea / vomiting. Start on day 3 after chemo. 30 tablet 1  . pantoprazole (PROTONIX) 40 MG tablet Take 40 mg by mouth 2 (two) times daily.   0  . Probiotic Product (PROBIOTIC DAILY PO) Take 1 capsule by mouth daily.     . prochlorperazine (COMPAZINE) 10 MG tablet Take 1 tablet (10 mg total) by mouth every 6 (six) hours as needed (Nausea or vomiting). 30 tablet 1  . saccharomyces boulardii (FLORASTOR) 250 MG capsule Take 1 capsule (250 mg total) by mouth 2 (two) times daily.    Marland Kitchen zolpidem (AMBIEN) 5 MG tablet Take 1 tablet (5 mg total) by mouth at bedtime as needed for sleep. 30 tablet 1   No current facility-administered medications for this encounter.     REVIEW OF SYSTEMS:   Constitutional: Denies fevers, chills or abnormal night sweats Eyes: Denies blurriness of vision, double vision or watery eyes Ears, nose, mouth, throat, and face: Denies mucositis or sore  throat Respiratory: Denies cough, dyspnea or wheezes Cardiovascular: Denies palpitation, chest discomfort or lower extremity swelling Gastrointestinal:  Denies nausea, heartburn or change in bowel habits Skin: Denies abnormal skin rashes Lymphatics: Denies new lymphadenopathy or easy bruising Neurological:Denies numbness, tingling or new weaknesses Behavioral/Psych: Mood is stable, no new changes  Breast:  Denies any palpable lumps or discharge All other systems were reviewed with the patient and are  negative.    Vitals:   04/02/16 1024  BP: 102/68  Pulse: (!) 102   Filed Weights   04/02/16 1024  Weight: 135 lb 4 oz (61.3 kg)    General:  Fatigued appearing. No resp difficulty HEENT: normal no alopecia Neck: supple. RIJ portno JVD. Carotids 2+ bilat; no bruits. No lymphadenopathy or thryomegaly appreciated. Cor: PMI nondisplaced. Regular rate & rhythm. No rubs, gallops or murmurs. Lungs: clear Abdomen: soft, nontender, nondistended. No hepatosplenomegaly. No bruits or masses. Good bowel sounds. Extremities: no cyanosis, clubbing, rash, edema Neuro: alert & orientedx3, cranial nerves grossly intact. moves all 4 extremities w/o difficulty. Affect pleasant   LABORATORY DATA:  I have reviewed the data as listed Lab Results  Component Value Date   WBC 3.3 (L) 03/30/2016   HGB 9.0 (L) 03/30/2016   HCT 26.5 (L) 03/30/2016   MCV 92.0 03/30/2016   PLT 78 (L) 03/30/2016   Lab Results  Component Value Date   NA 136 03/30/2016   K 3.4 (L) 03/30/2016   CL 111 01/12/2016   CO2 25 03/30/2016     ASSESSMENT AND PLAN:   1. Breast cancer of upper-inner quadrant of right female breast ([place]) -- ER 90%, PR 2%, Ki-67 80%, HER-2 positive ratio 2.07;  -- Clinical stage: T2N0 (stage 2A) -- Now s/p 5/6 cycles neoadjuvant chemotherapy. ([place] Perjeta 6 cycles followed by Herceptin maintenance for one year) -- Followed by breast conserving surgery followed by adjuvant XRT followed by  adjuvant antiestrogen therapy -- I reviewed echos personally. EF and Doppler parameters stable. No HF on exam. Continue Herceptin.    Bensimhon, Daniel,MD 10:51 AM

## 2016-04-03 ENCOUNTER — Other Ambulatory Visit (HOSPITAL_COMMUNITY): Payer: BLUE CROSS/BLUE SHIELD

## 2016-04-03 ENCOUNTER — Encounter (HOSPITAL_COMMUNITY): Payer: BLUE CROSS/BLUE SHIELD | Admitting: Internal Medicine

## 2016-04-03 ENCOUNTER — Encounter: Payer: Self-pay | Admitting: General Practice

## 2016-04-03 NOTE — Progress Notes (Signed)
[place] Spiritual Care Note  Received update email from [place], who reports being in good spirits overall and navigating the rhythm of fatigue and energy due to tx.  Left VM of encouragement and care, suggesting f/u by phone or in person next week if desired.  Encouraged pt to return call.   [place], [place], [facility] Pager [phone] Voicemail [phone]

## 2016-04-06 ENCOUNTER — Telehealth: Payer: Self-pay | Admitting: Nurse Practitioner

## 2016-04-06 NOTE — Telephone Encounter (Signed)
Patient to check in with her regarding the rash on her back.  There was no answer; and a voicemail was left for the patient.

## 2016-04-14 NOTE — Progress Notes (Signed)
Open in error

## 2016-04-15 ENCOUNTER — Ambulatory Visit: Payer: BLUE CROSS/BLUE SHIELD | Admitting: Nutrition

## 2016-04-15 ENCOUNTER — Other Ambulatory Visit (HOSPITAL_BASED_OUTPATIENT_CLINIC_OR_DEPARTMENT_OTHER): Payer: BLUE CROSS/BLUE SHIELD

## 2016-04-15 ENCOUNTER — Ambulatory Visit: Payer: BLUE CROSS/BLUE SHIELD

## 2016-04-15 ENCOUNTER — Ambulatory Visit (HOSPITAL_BASED_OUTPATIENT_CLINIC_OR_DEPARTMENT_OTHER): Payer: BLUE CROSS/BLUE SHIELD | Admitting: Hematology

## 2016-04-15 ENCOUNTER — Ambulatory Visit (HOSPITAL_BASED_OUTPATIENT_CLINIC_OR_DEPARTMENT_OTHER): Payer: BLUE CROSS/BLUE SHIELD

## 2016-04-15 ENCOUNTER — Ambulatory Visit: Payer: BLUE CROSS/BLUE SHIELD | Admitting: Hematology

## 2016-04-15 VITALS — BP 114/81 | HR 81 | Temp 97.1°F | Resp 18 | Wt 135.9 lb

## 2016-04-15 DIAGNOSIS — Z170 Estrogen receptor positive status [ER+]: Secondary | ICD-10-CM

## 2016-04-15 DIAGNOSIS — R630 Anorexia: Secondary | ICD-10-CM

## 2016-04-15 DIAGNOSIS — D6481 Anemia due to antineoplastic chemotherapy: Secondary | ICD-10-CM | POA: Diagnosis not present

## 2016-04-15 DIAGNOSIS — C50211 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 female breast: Secondary | ICD-10-CM

## 2016-04-15 DIAGNOSIS — R634 Abnormal weight loss: Secondary | ICD-10-CM

## 2016-04-15 DIAGNOSIS — R197 Diarrhea, unspecified: Secondary | ICD-10-CM

## 2016-04-15 DIAGNOSIS — Z5111 Encounter for antineoplastic chemotherapy: Secondary | ICD-10-CM | POA: Diagnosis not present

## 2016-04-15 DIAGNOSIS — Z95828 Presence of other vascular implants and grafts: Secondary | ICD-10-CM

## 2016-04-15 DIAGNOSIS — Z5112 Encounter for antineoplastic immunotherapy: Secondary | ICD-10-CM | POA: Diagnosis not present

## 2016-04-15 LAB — CBC WITH DIFFERENTIAL/PLATELET
BASO%: 0.8 % (ref 0.0–2.0)
Basophils Absolute: 0 10*3/uL (ref 0.0–0.1)
EOS ABS: 0 10*3/uL (ref 0.0–0.5)
EOS%: 0.2 % (ref 0.0–7.0)
HCT: 27.2 % — ABNORMAL LOW (ref 34.8–46.6)
HGB: 9.2 g/dL — ABNORMAL LOW (ref 11.6–15.9)
LYMPH%: 26.6 % (ref 14.0–49.7)
MCH: 32.5 pg (ref 25.1–34.0)
MCHC: 33.7 g/dL (ref 31.5–36.0)
MCV: 96.6 fL (ref 79.5–101.0)
MONO#: 0.4 10*3/uL (ref 0.1–0.9)
MONO%: 9.8 % (ref 0.0–14.0)
NEUT#: 2.7 10*3/uL (ref 1.5–6.5)
NEUT%: 62.6 % (ref 38.4–76.8)
PLATELETS: 198 10*3/uL (ref 145–400)
RBC: 2.81 10*6/uL — AB (ref 3.70–5.45)
RDW: 18.4 % — ABNORMAL HIGH (ref 11.2–14.5)
WBC: 4.3 10*3/uL (ref 3.9–10.3)
lymph#: 1.1 10*3/uL (ref 0.9–3.3)

## 2016-04-15 LAB — COMPREHENSIVE METABOLIC PANEL
ALT: 18 U/L (ref 0–55)
ANION GAP: 8 meq/L (ref 3–11)
AST: 21 U/L (ref 5–34)
Albumin: 3.5 g/dL (ref 3.5–5.0)
Alkaline Phosphatase: 56 U/L (ref 40–150)
BUN: 17.6 mg/dL (ref 7.0–26.0)
CHLORIDE: 108 meq/L (ref 98–109)
CO2: 27 meq/L (ref 22–29)
Calcium: 8.8 mg/dL (ref 8.4–10.4)
Creatinine: 0.7 mg/dL (ref 0.6–1.1)
Glucose: 84 mg/dl (ref 70–140)
POTASSIUM: 3.4 meq/L — AB (ref 3.5–5.1)
Sodium: 143 mEq/L (ref 136–145)
Total Bilirubin: 0.42 mg/dL (ref 0.20–1.20)
Total Protein: 5.5 g/dL — ABNORMAL LOW (ref 6.4–8.3)

## 2016-04-15 MED ORDER — PALONOSETRON HCL INJECTION 0.25 MG/5ML
INTRAVENOUS | Status: AC
  Filled 2016-04-15: qty 5

## 2016-04-15 MED ORDER — SODIUM CHLORIDE 0.9 % IV SOLN
Freq: Once | INTRAVENOUS | Status: AC
  Administered 2016-04-15: 12:00:00 via INTRAVENOUS
  Filled 2016-04-15: qty 5

## 2016-04-15 MED ORDER — PALONOSETRON HCL INJECTION 0.25 MG/5ML
0.2500 mg | Freq: Once | INTRAVENOUS | Status: AC
  Administered 2016-04-15: 0.25 mg via INTRAVENOUS

## 2016-04-15 MED ORDER — SODIUM CHLORIDE 0.9 % IJ SOLN
10.0000 mL | INTRAMUSCULAR | Status: DC | PRN
  Administered 2016-04-15: 10 mL via INTRAVENOUS
  Filled 2016-04-15: qty 10

## 2016-04-15 MED ORDER — DIPHENHYDRAMINE HCL 25 MG PO CAPS
ORAL_CAPSULE | ORAL | Status: AC
  Filled 2016-04-15: qty 2

## 2016-04-15 MED ORDER — TRASTUZUMAB CHEMO 150 MG IV SOLR
6.0000 mg/kg | Freq: Once | INTRAVENOUS | Status: AC
  Administered 2016-04-15: 378 mg via INTRAVENOUS
  Filled 2016-04-15: qty 18

## 2016-04-15 MED ORDER — ACETAMINOPHEN 325 MG PO TABS
650.0000 mg | ORAL_TABLET | Freq: Once | ORAL | Status: AC
  Administered 2016-04-15: 650 mg via ORAL

## 2016-04-15 MED ORDER — SODIUM CHLORIDE 0.9% FLUSH
10.0000 mL | INTRAVENOUS | Status: DC | PRN
  Administered 2016-04-15: 10 mL
  Filled 2016-04-15: qty 10

## 2016-04-15 MED ORDER — HEPARIN SOD (PORK) LOCK FLUSH 100 UNIT/ML IV SOLN
500.0000 [IU] | Freq: Once | INTRAVENOUS | Status: AC | PRN
  Administered 2016-04-15: 500 [IU]
  Filled 2016-04-15: qty 5

## 2016-04-15 MED ORDER — CARBOPLATIN CHEMO INJECTION 600 MG/60ML
438.8000 mg | Freq: Once | INTRAVENOUS | Status: AC
  Administered 2016-04-15: 440 mg via INTRAVENOUS
  Filled 2016-04-15: qty 44

## 2016-04-15 MED ORDER — SODIUM CHLORIDE 0.9 % IV SOLN
Freq: Once | INTRAVENOUS | Status: AC
  Administered 2016-04-15: 12:00:00 via INTRAVENOUS

## 2016-04-15 MED ORDER — ACETAMINOPHEN 325 MG PO TABS
ORAL_TABLET | ORAL | Status: AC
  Filled 2016-04-15: qty 2

## 2016-04-15 MED ORDER — DIPHENHYDRAMINE HCL 25 MG PO CAPS
25.0000 mg | ORAL_CAPSULE | Freq: Once | ORAL | Status: AC
  Administered 2016-04-15: 25 mg via ORAL

## 2016-04-15 MED ORDER — PEGFILGRASTIM 6 MG/0.6ML ~~LOC~~ PSKT
6.0000 mg | PREFILLED_SYRINGE | Freq: Once | SUBCUTANEOUS | Status: DC
  Filled 2016-04-15: qty 0.6

## 2016-04-15 MED ORDER — SODIUM CHLORIDE 0.9 % IV SOLN
420.0000 mg | Freq: Once | INTRAVENOUS | Status: AC
  Administered 2016-04-15: 420 mg via INTRAVENOUS
  Filled 2016-04-15: qty 14

## 2016-04-15 MED ORDER — DEXTROSE 5 % IV SOLN
65.0000 mg/m2 | Freq: Once | INTRAVENOUS | Status: AC
  Administered 2016-04-15: 120 mg via INTRAVENOUS
  Filled 2016-04-15: qty 12

## 2016-04-15 NOTE — Progress Notes (Signed)
[place]  [phone] [phone]  Clinic Follow up Note   Patient Care Team: Kelton Pillar, MD as PCP - General (Family Medicine) 04/18/2016  CHIEF COMPLAIN: follow up right breast cancer and cycle 6 neoadjuvant chemo   SUMMARY OF ONCOLOGIC HISTORY:   Breast cancer of upper-inner quadrant of right female breast ([place])   12/05/2015 Mammogram    Right breast mass 2:00 4 cm from nipple: 1.7 cm, T1c N0 stage IA; right breast 2:00 6 cm from nipple 1.3 cm mass, 7 mm satellite nodule between the 2 masses, T1 cN0 stage IA      12/06/2015 Initial Diagnosis    Right breast biopsy 2:00 6 cm from nipple: Grade 2-3 invasive ductal cancer, ER 90%, PR 2%, Ki-67 80%, HER-2 positive ratio 2.07; right biopsy 2:00 4 cm from nipple: IDC with DCIS, ER 95%, PR 10%, Ki-67 90%, HER-2 negative ratio 1.27      12/18/2015 Breast MRI    3 enhancing masses in the upper inner quadrant right breast spanning an area 3.7 cm (1.7 cm, 1 cm, 1.4 cm) no lymph node enlargement      12/31/2015 -  Neo-Adjuvant Chemotherapy    TCH Perjeta 6 cycles followed by Herceptin maintenance for 1 year      01/08/2016 - 01/12/2016 Hospital Admission    Neutropenic fever hospitalization (because patient did not receive Neulasta with cycle 1)      01/10/2016 Miscellaneous    Genetic testing was normal did not reveal any mutations       INTERVAL HISTORY: The patient returns for follow up and her last cycle of neoadjuvant chemo. She reports chemo is difficult. Reports fatigue, poor appetite and fluid intake, weight loss of about 5-6 lbs after every cycle of chemo, and poor taste. She is using nutritional supplements. She reports diarrhea 1-2 times a day managed with imodium. She denies neuropathy. The patient is scheduled for nutritional consult today.  REVIEW OF SYSTEMS:   Constitutional: Denies fevers, chills. (+) Weight loss. (+) Poor appetite and taste. (+) Fatigue. Eyes: Denies  blurriness of vision Ears, nose, mouth, throat, and face: Denies mucositis or sore throat Respiratory: Denies cough, dyspnea or wheezes Cardiovascular: Denies palpitation, chest discomfort or lower extremity swelling Gastrointestinal:  Denies nausea, heartburn or change in bowel habits Skin: Denies abnormal skin rashes Lymphatics: Denies new lymphadenopathy or easy bruising Neurological:Denies numbness, tingling or new weaknesses Behavioral/Psych: Mood is stable, no new changes  All other systems were reviewed with the patient and are negative.  MEDICAL HISTORY:  Past Medical History:  Diagnosis Date  . Breast cancer ([place])   . Diverticulosis   . Family history of breast cancer   . Genital herpes   . Heart murmur    had ECHO 12 yrs ago was neg  . Internal hemorrhoid     SURGICAL HISTORY: Past Surgical History:  Procedure Laterality Date  . BREAST ENHANCEMENT SURGERY    . BREAST LUMPECTOMY     right/BENIGN  . PORTACATH PLACEMENT N/A 12/30/2015   Procedure: INSERTION PORT-A-CATH WITH [place];  Surgeon: Rolm Bookbinder, MD;  Location: [place];  Service: General;  Laterality: N/A;    I have reviewed the social history and family history with the patient and they are unchanged from previous note.  ALLERGIES:  has No Known Allergies.  MEDICATIONS:  Current Outpatient Prescriptions  Medication Sig Dispense Refill  . ALPRAZolam (XANAX) 0.5 MG tablet Take 1 tablet (0.5 mg total) by mouth at bedtime  as needed for anxiety. 60 tablet 0  . Ascorbic Acid (VITAMIN C PO) Take 1 tablet by mouth daily.     . calcium carbonate (OS-CAL) 600 MG TABS tablet Take 600 mg by mouth daily.    . Cholecalciferol (VITAMIN D3) 1000 units CAPS Take 4,000 Units by mouth daily.    Marland Kitchen dexamethasone (DECADRON) 4 MG tablet Take 1 tablet (4 mg total) by mouth daily. 1 tablet daily before chemotherapy and 1 tablet day after chemotherapy 15 tablet 0  . dicyclomine (BENTYL) 10 MG capsule Take 1  capsule (10 mg total) by mouth 4 (four) times daily -  before meals and at bedtime. 60 capsule 1  . diphenoxylate-atropine (LOMOTIL) 2.5-0.025 MG tablet Take 1 tablet by mouth 4 (four) times daily as needed for diarrhea or loose stools. 120 tablet 2  . HYDROcodone-acetaminophen (NORCO) 10-325 MG tablet Take 1 tablet by mouth every 6 (six) hours as needed. 10 tablet 0  . ibuprofen (ADVIL,MOTRIN) 200 MG tablet Take 400-600 mg by mouth every 6 (six) hours as needed for moderate pain.    Marland Kitchen lidocaine-prilocaine (EMLA) cream Apply to affected area once 30 g 3  . loperamide (IMODIUM) 2 MG capsule Take 2 mg by mouth as needed for diarrhea or loose stools.    . magnesium 30 MG tablet Take 30 mg by mouth daily.     . Multiple Vitamin (MULTIVITAMIN WITH MINERALS) TABS tablet Take 1 tablet by mouth daily.    . Omega-3 Fatty Acids (FISH OIL PO) Take 1 capsule by mouth daily.     . ondansetron (ZOFRAN) 8 MG tablet Take 1 tablet (8 mg total) by mouth 2 (two) times daily as needed for refractory nausea / vomiting. Start on day 3 after chemo. 30 tablet 1  . pantoprazole (PROTONIX) 40 MG tablet Take 40 mg by mouth 2 (two) times daily.   0  . Probiotic Product (PROBIOTIC DAILY PO) Take 1 capsule by mouth daily.     . prochlorperazine (COMPAZINE) 10 MG tablet Take 1 tablet (10 mg total) by mouth every 6 (six) hours as needed (Nausea or vomiting). 30 tablet 1  . saccharomyces boulardii (FLORASTOR) 250 MG capsule Take 1 capsule (250 mg total) by mouth 2 (two) times daily.    Marland Kitchen zolpidem (AMBIEN) 5 MG tablet Take 1 tablet (5 mg total) by mouth at bedtime as needed for sleep. 30 tablet 1   No current facility-administered medications for this visit.     PHYSICAL EXAMINATION: ECOG PERFORMANCE STATUS: 1 - Symptomatic but completely ambulatory  Vitals:   04/15/16 1007  BP: 114/81  Pulse: 81  Resp: 18  Temp: 97.1 F (36.2 C)   Filed Weights   04/15/16 1007  Weight: 135 lb 14.4 oz (61.6 kg)    GENERAL:alert, no  distress and comfortable SKIN: skin color, texture, turgor are normal, no rashes or significant lesions EYES: normal, Conjunctiva are pink and non-injected, sclera clear OROPHARYNX:no exudate, no erythema and lips, buccal mucosa, and tongue normal  NECK: supple, thyroid normal size, non-tender, without nodularity LYMPH:  no palpable lymphadenopathy in the cervical, axillary or inguinal LUNGS: clear to auscultation and percussion with normal breathing effort HEART: regular rate & rhythm and no murmurs and no lower extremity edema ABDOMEN:abdomen soft, non-tender and normal bowel sounds Musculoskeletal:no cyanosis of digits and no clubbing  NEURO: alert & oriented x 3 with fluent speech, no focal motor/sensory deficits BREAST: Deferred during this encounter.  LABORATORY DATA:  I have reviewed the data as  listed CBC Latest Ref Rng & Units 04/15/2016 03/30/2016 03/24/2016  WBC 3.9 - 10.3 10e3/uL 4.3 3.3(L) 3.0(L)  Hemoglobin 11.6 - 15.9 g/dL 9.2(L) 9.0(L) 9.0(L)  Hematocrit 34.8 - 46.6 % 27.2(L) 26.5(L) 27.0(L)  Platelets 145 - 400 10e3/uL 198 78(L) 172     CMP Latest Ref Rng & Units 04/15/2016 03/30/2016 03/24/2016  Glucose 70 - 140 mg/dl 84 100 91  BUN 7.0 - 26.0 mg/dL 17.6 21.5 16.5  Creatinine 0.6 - 1.1 mg/dL 0.7 0.8 0.7  Sodium 136 - 145 mEq/L 143 136 141  Potassium 3.5 - 5.1 mEq/L 3.4(L) 3.4(L) 3.3(L)  Chloride 101 - 111 mmol/L - - -  CO2 22 - 29 mEq/L '27 25 24  ' Calcium 8.4 - 10.4 mg/dL 8.8 9.4 9.0  Total Protein 6.4 - 8.3 g/dL 5.5(L) 6.5 5.9(L)  Total Bilirubin 0.20 - 1.20 mg/dL 0.42 0.93 0.81  Alkaline Phos 40 - 150 U/L 56 93 65  AST 5 - 34 U/L '21 29 25  ' ALT 0 - 55 U/L 18 39 21      RADIOGRAPHIC STUDIES: I have personally reviewed the radiological images as listed and agreed with the findings in the report. No results found.   ASSESSMENT & PLAN: 60 y.o. postmenopausal female with screening detected multifocal, cT2N0M0, stage IIA, ER+/PR+/HER2+ and ER+/PR+/HER2- invasive  ductal carcinoma of the right breast  1. Breast cancer of upper inner quadrant of right female breast, multifocal, cT2N0M0, stage IIA, ER+/PR+/HER2+ and ER+/PR+/HER2- -She is here for cycle 6 of neoadjuvant chemo with TCHP. -she is doing moderately well overall, has fatigue, diarrhea, low appetite and weight loss etc, which has been ongoing since she started chemo, improved with dose reduction. It's overall manageable, and she seems to understand the management strategies well, although she appears to be anxious  -lab reviewed, adequate for treatment, will proceed her last cycle chemotherapy with same doses as previous cycle  -Bilateral breast MRI scheduled tomorrow.  2. Anemia secondary to chemo -Hb 9.2 today. -Potassium is low today (3.4). She has difficulty taking the Potassium pill and tries to get it through her diet. -Cr 0.7 today. -Platelets 198 today. -Labs apprpriate to proceed with chemo.  3. Dental issue  -The patient states she has a dental cleaning scheduled in mid-January. -We discussed pushing it out for one more week to decrease risk of infection and bleeding.  PLAN: -Proceed with last cycle of TCHP today. -Follow up with Dr. Lindi Adie on 03/22/2017. -Breast MRI tomorrow. -IVF this Friday or Saturday, the pt prefers IVF in [facility]. -Lab, flush, and chemo (herceptin and perjeta) in 3 weeks. -follow up wtih nutrition consult today.   All questions were answered. The patient knows to call the clinic with any problems, questions or concerns. No barriers to learning was detected. I spent 20 minutes counseling the patient face to face. The total time spent in the appointment was 25 minutes and more than 50% was on counseling and review of test results     Truitt Merle, MD 04/15/2016  This document serves as a record of services personally performed by Truitt Merle, MD. It was created on her behalf by Darcus Austin, a trained medical scribe. The creation of this record is based on the  scribe's personal observations and the provider's statements to them. This document has been checked and approved by the attending provider.

## 2016-04-15 NOTE — Patient Instructions (Signed)
[facility] Discharge Instructions for Patients Receiving Chemotherapy  Today you received the following chemotherapy agents Taxotere/Carboplatin/Herceptin/Perjeta  To help prevent nausea and vomiting after your treatment, we encourage you to take your nausea medication    If you develop nausea and vomiting that is not controlled by your nausea medication, call the clinic.   BELOW ARE SYMPTOMS THAT SHOULD BE REPORTED IMMEDIATELY:  *FEVER GREATER THAN 100.5 F  *CHILLS WITH OR WITHOUT FEVER  NAUSEA AND VOMITING THAT IS NOT CONTROLLED WITH YOUR NAUSEA MEDICATION  *UNUSUAL SHORTNESS OF BREATH  *UNUSUAL BRUISING OR BLEEDING  TENDERNESS IN MOUTH AND THROAT WITH OR WITHOUT PRESENCE OF ULCERS  *URINARY PROBLEMS  *BOWEL PROBLEMS  UNUSUAL RASH Items with * indicate a potential emergency and should be followed up as soon as possible.  Feel free to call the clinic you have any questions or concerns. The clinic phone number is [phone].  Please show the CHEMO ALERT CARD at check-in to the Emergency Department and triage nurse.   

## 2016-04-15 NOTE — Progress Notes (Signed)
Patient scheduled for MRI tomorrow afternoon; Neulasta Onpro held today; Patient to receive Neulasta tomorrow (04/16/16).  Patient aware and verifies understanding.

## 2016-04-15 NOTE — Progress Notes (Signed)
60 year old female diagnosed with breast cancer.  Past medical history includes diverticulitis.  Medications include Xanax, vitamin C, Os-Cal, vitamin D 3, Decadron, Lomotil, Imodium, magnesium, multivitamin, omega-3 fatty acid, Zofran, Protonix, Compazine, probiotics and florastor.  Labs were reviewed.  Height: 63 inches. Weight: 135.9 pounds December 27. Usual body weight: 160 pounds May 2016 and September 2017. BMI: 24.07.  Patient is completing cycle 6 of neoadjuvant chemotherapy. She reports she has decreased oral intake for 2 weeks after treatment. Reports everything taste bad and she has to force herself to eat. She admits she "doesn't force" herself too much. Usually by the third week, she is able to increase oral intake.  Nutrition diagnosis: Unintended weight loss related to inadequate oral intake as evidenced by 25 pound weight loss over 3 months.  Intervention: Educated patient to continue to try small amounts of food more often, focusing on protein foods that she tolerates. Educated patient on additional strategies for taste alterations. Provided additional fact sheets. Recommended patient try Carnation breakfast essentials. Questions were answered.  Teach back method used.  Monitoring, evaluation, goals: Patient will tolerate increased calories and protein to minimize further weight loss.  Next visit: No follow-up scheduled.  **Disclaimer: This note was dictated with voice recognition software. Similar sounding words can inadvertently be transcribed and this note may contain transcription errors which may not have been corrected upon publication of note.**

## 2016-04-16 ENCOUNTER — Ambulatory Visit (HOSPITAL_BASED_OUTPATIENT_CLINIC_OR_DEPARTMENT_OTHER): Payer: BLUE CROSS/BLUE SHIELD

## 2016-04-16 ENCOUNTER — Ambulatory Visit
Admission: RE | Admit: 2016-04-16 | Discharge: 2016-04-16 | Disposition: A | Discharge status: Home / Self Care | Payer: BLUE CROSS/BLUE SHIELD | Source: Ambulatory Visit | Attending: Hematology and Oncology | Admitting: Hematology and Oncology

## 2016-04-16 VITALS — BP 106/79 | HR 82 | Temp 98.2°F | Resp 18

## 2016-04-16 DIAGNOSIS — C50211 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 female breast: Secondary | ICD-10-CM

## 2016-04-16 DIAGNOSIS — Z170 Estrogen receptor positive status [ER+]: Secondary | ICD-10-CM

## 2016-04-16 DIAGNOSIS — Z5189 Encounter for other specified aftercare: Secondary | ICD-10-CM | POA: Diagnosis not present

## 2016-04-16 MED ORDER — PEGFILGRASTIM INJECTION 6 MG/0.6ML ~~LOC~~
6.0000 mg | PREFILLED_SYRINGE | Freq: Once | SUBCUTANEOUS | Status: AC
  Administered 2016-04-16: 6 mg via SUBCUTANEOUS
  Filled 2016-04-16: qty 0.6

## 2016-04-16 MED ORDER — GADOBENATE DIMEGLUMINE 529 MG/ML IV SOLN
12.0000 mL | Freq: Once | INTRAVENOUS | Status: AC | PRN
  Administered 2016-04-16: 12 mL via INTRAVENOUS

## 2016-04-16 NOTE — Patient Instructions (Signed)
Pegfilgrastim injection What is this medicine? PEGFILGRASTIM (PEG fil gra stim) is a long-acting granulocyte colony-stimulating factor that stimulates the growth of neutrophils, a type of white blood cell important in the body's fight against infection. It is used to reduce the incidence of fever and infection in patients with certain types of cancer who are receiving chemotherapy that affects the bone marrow, and to increase survival after being exposed to high doses of radiation. This medicine may be used for other purposes; ask your health care provider or pharmacist if you have questions. COMMON BRAND NAME(S): Neulasta What should I tell my health care provider before I take this medicine? They need to know if you have any of these conditions: -kidney disease -latex allergy -ongoing radiation therapy -sickle cell disease -skin reactions to acrylic adhesives (On-Body Injector only) -an unusual or allergic reaction to pegfilgrastim, filgrastim, other medicines, foods, dyes, or preservatives -pregnant or trying to get pregnant -breast-feeding How should I use this medicine? This medicine is for injection under the skin. If you get this medicine at home, you will be taught how to prepare and give the pre-filled syringe or how to use the On-body Injector. Refer to the patient Instructions for Use for detailed instructions. Use exactly as directed. Take your medicine at regular intervals. Do not take your medicine more often than directed. It is important that you put your used needles and syringes in a special sharps container. Do not put them in a trash can. If you do not have a sharps container, call your pharmacist or healthcare provider to get one. Talk to your pediatrician regarding the use of this medicine in children. While this drug may be prescribed for selected conditions, precautions do apply. Overdosage: If you think you have taken too much of this medicine contact a poison control  center or emergency room at once. NOTE: This medicine is only for you. Do not share this medicine with others. What if I miss a dose? It is important not to miss your dose. Call your doctor or health care professional if you miss your dose. If you miss a dose due to an On-body Injector failure or leakage, a new dose should be administered as soon as possible using a single prefilled syringe for manual use. What may interact with this medicine? Interactions have not been studied. Give your health care provider a list of all the medicines, herbs, non-prescription drugs, or dietary supplements you use. Also tell them if you smoke, drink alcohol, or use illegal drugs. Some items may interact with your medicine. This list may not describe all possible interactions. Give your health care provider a list of all the medicines, herbs, non-prescription drugs, or dietary supplements you use. Also tell them if you smoke, drink alcohol, or use illegal drugs. Some items may interact with your medicine. What should I watch for while using this medicine? You may need blood work done while you are taking this medicine. If you are going to need a MRI, CT scan, or other procedure, tell your doctor that you are using this medicine (On-Body Injector only). What side effects may I notice from receiving this medicine? Side effects that you should report to your doctor or health care professional as soon as possible: -allergic reactions like skin rash, itching or hives, swelling of the face, lips, or tongue -dizziness -fever -pain, redness, or irritation at site where injected -pinpoint red spots on the skin -red or dark-brown urine -shortness of breath or breathing problems -stomach or   side pain, or pain at the shoulder -swelling -tiredness -trouble passing urine or change in the amount of urine Side effects that usually do not require medical attention (report to your doctor or health care professional if they  continue or are bothersome): -bone pain -muscle pain This list may not describe all possible side effects. Call your doctor for medical advice about side effects. You may report side effects to FDA at 1-800-FDA-1088. Where should I keep my medicine? Keep out of the reach of children. Store pre-filled syringes in a refrigerator between 2 and 8 degrees C (36 and 46 degrees F). Do not freeze. Keep in carton to protect from light. Throw away this medicine if it is left out of the refrigerator for more than 48 hours. Throw away any unused medicine after the expiration date. NOTE: This sheet is a summary. It may not cover all possible information. If you have questions about this medicine, talk to your doctor, pharmacist, or health care provider.  2017 Elsevier/Gold Standard (2014-04-26 14:30:14)  

## 2016-04-17 ENCOUNTER — Ambulatory Visit (HOSPITAL_BASED_OUTPATIENT_CLINIC_OR_DEPARTMENT_OTHER): Payer: BLUE CROSS/BLUE SHIELD | Admitting: Nurse Practitioner

## 2016-04-17 ENCOUNTER — Other Ambulatory Visit: Payer: BLUE CROSS/BLUE SHIELD

## 2016-04-17 ENCOUNTER — Encounter: Payer: Self-pay | Admitting: Nurse Practitioner

## 2016-04-17 VITALS — BP 105/62 | HR 92 | Temp 98.8°F | Resp 16 | Ht 63.0 in | Wt 136.6 lb

## 2016-04-17 DIAGNOSIS — E860 Dehydration: Secondary | ICD-10-CM | POA: Diagnosis not present

## 2016-04-17 DIAGNOSIS — C50211 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 female breast: Secondary | ICD-10-CM

## 2016-04-17 MED ORDER — SODIUM CHLORIDE 0.9 % IV SOLN
Freq: Once | INTRAVENOUS | Status: AC
  Administered 2016-04-17: 10:00:00 via INTRAVENOUS

## 2016-04-17 NOTE — Progress Notes (Signed)
Nurse visit only

## 2016-04-17 NOTE — Patient Instructions (Signed)
Dehydration, Adult Dehydration is a condition in which there is not enough fluid or water in the body. This happens when you lose more fluids than you take in. Important organs, such as the kidneys, brain, and heart, cannot function without a proper amount of fluids. Any loss of fluids from the body can lead to dehydration. Dehydration can range from mild to severe. This condition should be treated right away to prevent it from becoming severe. What are the causes? This condition may be caused by:  Vomiting.  Diarrhea.  Excessive sweating, such as from heat exposure or exercise.  Not drinking enough fluid, especially:  When ill.  While doing activity that requires a lot of energy.  Excessive urination.  Fever.  Infection.  Certain medicines, such as medicines that cause the body to lose excess fluid (diuretics).  Inability to access safe drinking water.  Reduced physical ability to get adequate water and food. What increases the risk? This condition is more likely to develop in people:  Who have a poorly controlled long-term (chronic) illness, such as diabetes, heart disease, or kidney disease.  Who are age 65 or older.  Who are disabled.  Who live in a place with high altitude.  Who play endurance sports. What are the signs or symptoms? Symptoms of mild dehydration may include:   Thirst.  Dry lips.  Slightly dry mouth.  Dry, warm skin.  Dizziness. Symptoms of moderate dehydration may include:   Very dry mouth.  Muscle cramps.  Dark urine. Urine may be the color of tea.  Decreased urine production.  Decreased tear production.  Heartbeat that is irregular or faster than normal (palpitations).  Headache.  Light-headedness, especially when you stand up from a sitting position.  Fainting (syncope). Symptoms of severe dehydration may include:   Changes in skin, such as:  Cold and clammy skin.  Blotchy (mottled) or pale skin.  Skin that does  not quickly return to normal after being lightly pinched and released (poor skin turgor).  Changes in body fluids, such as:  Extreme thirst.  No tear production.  Inability to sweat when body temperature is high, such as in hot weather.  Very little urine production.  Changes in vital signs, such as:  Weak pulse.  Pulse that is more than 100 beats a minute when sitting still.  Rapid breathing.  Low blood pressure.  Other changes, such as:  Sunken eyes.  Cold hands and feet.  Confusion.  Lack of energy (lethargy).  Difficulty waking up from sleep.  Short-term weight loss.  Unconsciousness. How is this diagnosed? This condition is diagnosed based on your symptoms and a physical exam. Blood and urine tests may be done to help confirm the diagnosis. How is this treated? Treatment for this condition depends on the severity. Mild or moderate dehydration can often be treated at home. Treatment should be started right away. Do not wait until dehydration becomes severe. Severe dehydration is an emergency and it needs to be treated in a hospital. Treatment for mild dehydration may include:   Drinking more fluids.  Replacing salts and minerals in your blood (electrolytes) that you may have lost. Treatment for moderate dehydration may include:   Drinking an oral rehydration solution (ORS). This is a drink that helps you replace fluids and electrolytes (rehydrate). It can be found at pharmacies and retail stores. Treatment for severe dehydration may include:   Receiving fluids through an IV tube.  Receiving an electrolyte solution through a feeding tube that is   passed through your nose and into your stomach (nasogastric tube, or NG tube).  Correcting any abnormalities in electrolytes.  Treating the underlying cause of dehydration. Follow these instructions at home:  If directed by your health care provider, drink an ORS:  Make an ORS by following instructions on the  package.  Start by drinking small amounts, about  cup (120 mL) every 5-10 minutes.  Slowly increase how much you drink until you have taken the amount recommended by your health care provider.  Drink enough clear fluid to keep your urine clear or pale yellow. If you were told to drink an ORS, finish the ORS first, then start slowly drinking other clear fluids. Drink fluids such as:  Water. Do not drink only water. Doing that can lead to having too little salt (sodium) in the body (hyponatremia).  Ice chips.  Fruit juice that you have added water to (diluted fruit juice).  Low-calorie sports drinks.  Avoid:  Alcohol.  Drinks that contain a lot of sugar. These include high-calorie sports drinks, fruit juice that is not diluted, and soda.  Caffeine.  Foods that are greasy or contain a lot of fat or sugar.  Take over-the-counter and prescription medicines only as told by your health care provider.  Do not take sodium tablets. This can lead to having too much sodium in the body (hypernatremia).  Eat foods that contain a healthy balance of electrolytes, such as bananas, oranges, potatoes, tomatoes, and spinach.  Keep all follow-up visits as told by your health care provider. This is important. Contact a health care provider if:  You have abdominal pain that:  Gets worse.  Stays in one area (localizes).  You have a rash.  You have a stiff neck.  You are more irritable than usual.  You are sleepier or more difficult to wake up than usual.  You feel weak or dizzy.  You feel very thirsty.  You have urinated only a small amount of very dark urine over 6-8 hours. Get help right away if:  You have symptoms of severe dehydration.  You cannot drink fluids without vomiting.  Your symptoms get worse with treatment.  You have a fever.  You have a severe headache.  You have vomiting or diarrhea that:  Gets worse.  Does not go away.  You have blood or green matter  (bile) in your vomit.  You have blood in your stool. This may cause stool to look black and tarry.  You have not urinated in 6-8 hours.  You faint.  Your heart rate while sitting still is over 100 beats a minute.  You have trouble breathing. This information is not intended to replace advice given to you by your health care provider. Make sure you discuss any questions you have with your health care provider. Document Released: 04/06/2005 Document Revised: 11/01/2015 Document Reviewed: 05/31/2015 Elsevier Interactive Patient Education  2017 [employer]  

## 2016-04-18 ENCOUNTER — Encounter: Payer: Self-pay | Admitting: Hematology

## 2016-04-21 ENCOUNTER — Encounter: Payer: Self-pay | Admitting: *Deleted

## 2016-04-21 NOTE — Assessment & Plan Note (Signed)
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 Genetic counseling: Normal did not reveal any abnormal mutations 2. Neoadjuvant chemotherapy. ([place] Perjeta 6 cycles followed by Herceptin maintenance for one year) 3. Breast conserving surgery followed by 4. Adjuvant radiation therapy followed by 5. Adjuvant antiestrogen therapy -------------------------------------------------------------------------------------------------------------------------------------------------- Treatment Summary: [place] completed 6 cycles from 12/31/15 to 04/15/16 Echocardiogram 12/26/2015: EF 60-65%  Chemotherapy toxicities: 1. Hospitalization: 01/08/2016 to 01/12/2016 for neutropenic fever (because her insurance company denied Neulasta for cycle 1): We will plan on giving Neulasta with cycle 2 and will get insurance authorization again. Cultures were negative. 2. leg edema: was onLasix 20 mg daily 01/13/2016, resolved. DiscontinuedLasix 3. Severe anemia: Hemoglobin 9.1. Baseline hemoglobin of 13 g. Related to chemotherapy.  4. Hypokalemia: due to persistentdiarrhea. Potassium today is 3.3, encouraged increase dietary potassium 5. Chemotherapy-induced diarrhea:Finally subsided 6. Chemotherapy-induced abdominal cramps: Bentyl has helped tremendously. 7. Chemotherapy-induced nausea and vomiting: Resolved with reduced dosage of chemotherapy 8. Insomnia: On Ambien CR 5 mg at bedtime. 9. Chemotherapy-induced peripheral neuropathy: Grade 1 10. Profound lack of taste and appetite: We talked about appetite stimulants and I  elected not to pursue them at this time.  Patient may be contemplating on Bil Mastectomies She is concerned that since she has implants,radiation to the breast may cause contractures.  Breast MRI 04/16/16: Near CR to therapy. Previous 2 biopsied massses are no longer seen. Third satellite lesion is smaller from 10 mm to 5.3 mm, no abnormal LN.

## 2016-04-21 NOTE — Progress Notes (Signed)
Patient Care Team: Kelton Pillar, MD as PCP - General (Family Medicine)  DIAGNOSIS:  Encounter Diagnosis  Name Primary?  . 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 breast in female, estrogen receptor positive ([place])     SUMMARY OF ONCOLOGIC HISTORY:   Breast cancer of upper-inner quadrant of right female breast ([place])   12/05/2015 Mammogram    Right breast mass 2:00 4 cm from nipple: 1.7 cm, T1c N0 stage IA; right breast 2:00 6 cm from nipple 1.3 cm mass, 7 mm satellite nodule between the 2 masses, T1 cN0 stage IA      12/06/2015 Initial Diagnosis    Right breast biopsy 2:00 6 cm from nipple: Grade 2-3 invasive ductal cancer, ER 90%, PR 2%, Ki-67 80%, HER-2 positive ratio 2.07; right biopsy 2:00 4 cm from nipple: IDC with DCIS, ER 95%, PR 10%, Ki-67 90%, HER-2 negative ratio 1.27      12/18/2015 Breast MRI    3 enhancing masses in the upper inner quadrant right breast spanning an area 3.7 cm (1.7 cm, 1 cm, 1.4 cm) no lymph node enlargement      12/31/2015 - 04/15/2016 Neo-Adjuvant Chemotherapy    TCH Perjeta 6 cycles followed by Herceptin, Perjeta maintenance for 1 year      01/08/2016 - 01/12/2016 Hospital Admission    Neutropenic fever hospitalization (because patient did not receive Neulasta with cycle 1)      01/10/2016 Miscellaneous    Genetic testing was normal did not reveal any mutations      04/16/2016 Breast MRI    Near CR to therapy. Previous 2 biopsied massses are no longer seen. Third satellite lesion is smaller from 10 mm to 5.3 mm, no abnormal LN.       CHIEF COMPLIANT: F/U after Breast MRI  INTERVAL HISTORY: Patricia Chandler is a 61 yr old who completed 6 cycles of Neo adj chemo with TCH-P and is ere to discuss the results of the Breast MRI. She has severe fatigue and intermittant diarrhea. She also lost significant weight with chemo. Today she complains of sinus discharge and postnasal drip with a productive cough. No fevers.  REVIEW OF  SYSTEMS:   Constitutional: Denies fevers, chills. Profound weight loss Eyes: Denies blurriness of vision Ears, nose, mouth, throat, and face: Postnasal drip and sinus discharge Respiratory: Denies cough, dyspnea or wheezes Cardiovascular: Denies palpitation, chest discomfort Gastrointestinal:  Denies nausea, heartburn or change in bowel habits Skin: Denies abnormal skin rashes Lymphatics: Denies new lymphadenopathy or easy bruising Neurological:Denies numbness, tingling or new weaknesses Behavioral/Psych: Mood is stable, no new changes  Extremities: No lower extremity edema All other systems were reviewed with the patient and are negative.  I have reviewed the past medical history, past surgical history, social history and family history with the patient and they are unchanged from previous note.  ALLERGIES:  has No Known Allergies.  MEDICATIONS:  Current Outpatient Prescriptions  Medication Sig Dispense Refill  . ALPRAZolam (XANAX) 0.5 MG tablet Take 1 tablet (0.5 mg total) by mouth at bedtime as needed for anxiety. 60 tablet 0  . Ascorbic Acid (VITAMIN C PO) Take 1 tablet by mouth daily.     . calcium carbonate (OS-CAL) 600 MG TABS tablet Take 600 mg by mouth daily.    . Cholecalciferol (VITAMIN D3) 1000 units CAPS Take 4,000 Units by mouth daily.    Marland Kitchen dicyclomine (BENTYL) 10 MG capsule Take 1 capsule (10 mg total) by mouth 4 (four) times daily -  before meals and at bedtime. 60 capsule 1  . diphenoxylate-atropine (LOMOTIL) 2.5-0.025 MG tablet Take 1 tablet by mouth 4 (four) times daily as needed for diarrhea or loose stools. 120 tablet 2  . HYDROcodone-acetaminophen (NORCO) 10-325 MG tablet Take 1 tablet by mouth every 6 (six) hours as needed. 10 tablet 0  . ibuprofen (ADVIL,MOTRIN) 200 MG tablet Take 400-600 mg by mouth every 6 (six) hours as needed for moderate pain.    Marland Kitchen levofloxacin (LEVAQUIN) 500 MG tablet Take 1 tablet (500 mg total) by mouth daily. 7 tablet 0  . loperamide  (IMODIUM) 2 MG capsule Take 2 mg by mouth as needed for diarrhea or loose stools.    . magnesium 30 MG tablet Take 30 mg by mouth daily.     . Multiple Vitamin (MULTIVITAMIN WITH MINERALS) TABS tablet Take 1 tablet by mouth daily.    . Omega-3 Fatty Acids (FISH OIL PO) Take 1 capsule by mouth daily.     . pantoprazole (PROTONIX) 40 MG tablet Take 40 mg by mouth 2 (two) times daily.   0  . Probiotic Product (PROBIOTIC DAILY PO) Take 1 capsule by mouth daily.     Marland Kitchen saccharomyces boulardii (FLORASTOR) 250 MG capsule Take 1 capsule (250 mg total) by mouth 2 (two) times daily.    Marland Kitchen zolpidem (AMBIEN) 5 MG tablet Take 1 tablet (5 mg total) by mouth at bedtime as needed for sleep. 30 tablet 1   No current facility-administered medications for this visit.     PHYSICAL EXAMINATION: ECOG PERFORMANCE STATUS: 2  GENERAL:alert, no distress and comfortable SKIN: skin color, texture, turgor are normal, no rashes or significant lesions EYES: normal, Conjunctiva are pink and non-injected, sclera clear OROPHARYNX:no exudate, no erythema and lips, buccal mucosa, and tongue normal  NECK: supple, thyroid normal size, non-tender, without nodularity LYMPH:  no palpable lymphadenopathy in the cervical, axillary or inguinal LUNGS: clear to auscultation and percussion with normal breathing effort HEART: regular rate & rhythm and no murmurs and no lower extremity edema ABDOMEN:abdomen soft, non-tender and normal bowel sounds MUSCULOSKELETAL:no cyanosis of digits and no clubbing  NEURO: alert & oriented x 3 with fluent speech, no focal motor/sensory deficits EXTREMITIES: No lower extremity edema  LABORATORY DATA:  I have reviewed the data as listed   Chemistry      Component Value Date/Time   NA 143 04/15/2016 0927   K 3.4 (L) 04/15/2016 0927   CL 111 01/12/2016 0520   CO2 27 04/15/2016 0927   BUN 17.6 04/15/2016 0927   CREATININE 0.7 04/15/2016 0927      Component Value Date/Time   CALCIUM 8.8  04/15/2016 0927   ALKPHOS 56 04/15/2016 0927   AST 21 04/15/2016 0927   ALT 18 04/15/2016 0927   BILITOT 0.42 04/15/2016 0927       Lab Results  Component Value Date   WBC 4.3 04/15/2016   HGB 9.2 (L) 04/15/2016   HCT 27.2 (L) 04/15/2016   MCV 96.6 04/15/2016   PLT 198 04/15/2016   NEUTROABS 2.7 04/15/2016    ASSESSMENT & PLAN:  Breast cancer of upper-inner quadrant of right female breast ([place]) Right breast biopsy 2:00 6 cm from nipple: Grade 2-3 invasive ductal cancer, ER 90%, PR 2%, Ki-67 80%, HER-2 positive ratio 2.07; right biopsy 2:00 4 cm from nipple: IDC with DCIS, ER 95%, PR 10%, Ki-67 90%, HER-2 negative ratio 1.27 Breast MRI08/30/2017: 3 enhancing masses in the upper inner quadrant right breast spanning an area 3.7  cm (1.7 cm, 1 cm, 1.4 cm) no lymph node enlargement Clinical stage: T2N0 (stage 2A)  Treatment plan: 1. Genetic counseling: Normal did not reveal any abnormal mutations 2. Neoadjuvant chemotherapy. ([place] Perjeta 6 cycles followed by Herceptin maintenance for one year) 3. Breast conserving surgery followed by 4. Adjuvant radiation therapy followed by 5. Adjuvant antiestrogen therapy -------------------------------------------------------------------------------------------------------------------------------------------------- Treatment Summary: [place] completed 6 cycles from 12/31/15 to 04/15/16 Echocardiogram 12/26/2015: EF 60-65%  Chemotherapy toxicities: 1. Hospitalization: 01/08/2016 to 01/12/2016 for neutropenic fever (because her insurance company denied Neulasta for cycle 1): We will plan on giving Neulasta with cycle 2 and will get insurance authorization again. Cultures were negative. 2. leg edema: was onLasix 20 mg daily 01/13/2016, resolved. DiscontinuedLasix 3. Severe anemia: Hemoglobin 9.1. Baseline hemoglobin of 13 g. Related to chemotherapy.  4. Hypokalemia: due to persistentdiarrhea. Potassium today is 3.3, encouraged increase  dietary potassium 5. Chemotherapy-induced diarrhea:Finally subsided 6. Chemotherapy-induced abdominal cramps: Bentyl has helped tremendously. 7. Chemotherapy-induced nausea and vomiting: Resolved with reduced dosage of chemotherapy 8. Insomnia: On Ambien CR 5 mg at bedtime. 9. Chemotherapy-induced peripheral neuropathy: Grade 1 10. Profound lack of taste and appetite: We talked about appetite stimulants and I elected not to pursue them at this time.  Patient will discuss with Dr. [name] today. I discussed the case is monitoring the tumor board with Dr. [name]. Patient would like to consider lumpectomy with radiation. She is concerned that since she has implants,radiation to the breast may cause contractures.  Breast MRI 04/16/16: Near CR to therapy. Previous 2 biopsied massses are no longer seen. Third satellite lesion is smaller from 10 mm to 5.3 mm, no abnormal LN. Patient is very happy to hear the results of MRI.  I plan to do Herceptin and Perjeta maintenance.  No orders of the defined types were placed in this encounter.  The patient has a good understanding of the overall plan. she agrees with it. she will call with any problems that may develop before the next visit here.   Rulon Eisenmenger, MD 04/22/16

## 2016-04-22 ENCOUNTER — Ambulatory Visit: Payer: BLUE CROSS/BLUE SHIELD | Admitting: Hematology and Oncology

## 2016-04-22 ENCOUNTER — Ambulatory Visit: Payer: BLUE CROSS/BLUE SHIELD

## 2016-04-22 ENCOUNTER — Encounter: Payer: Self-pay | Admitting: Hematology and Oncology

## 2016-04-22 ENCOUNTER — Ambulatory Visit (HOSPITAL_BASED_OUTPATIENT_CLINIC_OR_DEPARTMENT_OTHER): Payer: BLUE CROSS/BLUE SHIELD | Admitting: Hematology and Oncology

## 2016-04-22 ENCOUNTER — Other Ambulatory Visit: Payer: Self-pay | Admitting: Hematology and Oncology

## 2016-04-22 ENCOUNTER — Other Ambulatory Visit (HOSPITAL_COMMUNITY)
Admission: RE | Admit: 2016-04-22 | Discharge: 2016-04-22 | Disposition: A | Discharge status: Home / Self Care | Payer: BLUE CROSS/BLUE SHIELD | Source: Ambulatory Visit | Attending: Hematology and Oncology | Admitting: Hematology and Oncology

## 2016-04-22 ENCOUNTER — Other Ambulatory Visit: Payer: Self-pay | Admitting: Emergency Medicine

## 2016-04-22 DIAGNOSIS — Z170 Estrogen receptor positive status [ER+]: Secondary | ICD-10-CM

## 2016-04-22 DIAGNOSIS — R6889 Other general symptoms and signs: Secondary | ICD-10-CM

## 2016-04-22 DIAGNOSIS — C50211 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 female breast: Secondary | ICD-10-CM

## 2016-04-22 DIAGNOSIS — R197 Diarrhea, unspecified: Secondary | ICD-10-CM

## 2016-04-22 DIAGNOSIS — E860 Dehydration: Secondary | ICD-10-CM | POA: Diagnosis present

## 2016-04-22 DIAGNOSIS — R5383 Other fatigue: Secondary | ICD-10-CM

## 2016-04-22 DIAGNOSIS — G4700 Insomnia, unspecified: Secondary | ICD-10-CM

## 2016-04-22 DIAGNOSIS — R630 Anorexia: Secondary | ICD-10-CM

## 2016-04-22 LAB — INFLUENZA PANEL BY PCR (TYPE A & B)
INFLAPCR: NEGATIVE
Influenza B By PCR: NEGATIVE

## 2016-04-22 MED ORDER — LEVOFLOXACIN 500 MG PO TABS: 500.0000 mg | ORAL_TABLET | Freq: Every day | ORAL | 0 refills | Status: DC

## 2016-04-22 MED ORDER — HYDROCODONE-HOMATROPINE 5-1.5 MG/5ML PO SYRP: 5.0000 mL | ORAL_SOLUTION | Freq: Four times a day (QID) | ORAL | 0 refills | Status: DC | PRN

## 2016-04-22 MED FILL — levoFLOXacin 500 MG TABS: 500 | 7 days supply | Qty: 7 | Fill #0

## 2016-04-22 MED FILL — HYDROCODONE-HOMATROPINE SYR: 5-1.5 | 6 days supply | Qty: 120 | Fill #0

## 2016-04-23 ENCOUNTER — Encounter: Payer: Self-pay | Admitting: Nurse Practitioner

## 2016-04-23 ENCOUNTER — Ambulatory Visit (HOSPITAL_BASED_OUTPATIENT_CLINIC_OR_DEPARTMENT_OTHER): Payer: BLUE CROSS/BLUE SHIELD | Admitting: Nurse Practitioner

## 2016-04-23 VITALS — BP 114/68 | HR 105 | Temp 98.8°F | Resp 18 | Ht 63.0 in | Wt 127.8 lb

## 2016-04-23 DIAGNOSIS — Z170 Estrogen receptor positive status [ER+]: Secondary | ICD-10-CM

## 2016-04-23 DIAGNOSIS — C50211 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 female breast: Secondary | ICD-10-CM | POA: Diagnosis not present

## 2016-04-23 DIAGNOSIS — E860 Dehydration: Secondary | ICD-10-CM

## 2016-04-23 DIAGNOSIS — Z95828 Presence of other vascular implants and grafts: Secondary | ICD-10-CM

## 2016-04-23 MED ORDER — SODIUM CHLORIDE 0.9 % IJ SOLN
10.0000 mL | INTRAMUSCULAR | Status: DC | PRN
  Administered 2016-04-23: 10 mL via INTRAVENOUS
  Filled 2016-04-23: qty 10

## 2016-04-23 MED ORDER — HEPARIN SOD (PORK) LOCK FLUSH 100 UNIT/ML IV SOLN
500.0000 [IU] | Freq: Once | INTRAVENOUS | Status: AC | PRN
  Administered 2016-04-23: 500 [IU] via INTRAVENOUS
  Filled 2016-04-23: qty 5

## 2016-04-23 MED ORDER — SODIUM CHLORIDE 0.9 % IV SOLN
Freq: Once | INTRAVENOUS | Status: AC
  Administered 2016-04-23: 10:00:00 via INTRAVENOUS

## 2016-04-23 NOTE — Patient Instructions (Signed)
Dehydration, Adult Dehydration is a condition in which there is not enough fluid or water in the body. This happens when you lose more fluids than you take in. Important organs, such as the kidneys, brain, and heart, cannot function without a proper amount of fluids. Any loss of fluids from the body can lead to dehydration. Dehydration can range from mild to severe. This condition should be treated right away to prevent it from becoming severe. What are the causes? This condition may be caused by:  Vomiting.  Diarrhea.  Excessive sweating, such as from heat exposure or exercise.  Not drinking enough fluid, especially:  When ill.  While doing activity that requires a lot of energy.  Excessive urination.  Fever.  Infection.  Certain medicines, such as medicines that cause the body to lose excess fluid (diuretics).  Inability to access safe drinking water.  Reduced physical ability to get adequate water and food. What increases the risk? This condition is more likely to develop in people:  Who have a poorly controlled long-term (chronic) illness, such as diabetes, heart disease, or kidney disease.  Who are age 65 or older.  Who are disabled.  Who live in a place with high altitude.  Who play endurance sports. What are the signs or symptoms? Symptoms of mild dehydration may include:   Thirst.  Dry lips.  Slightly dry mouth.  Dry, warm skin.  Dizziness. Symptoms of moderate dehydration may include:   Very dry mouth.  Muscle cramps.  Dark urine. Urine may be the color of tea.  Decreased urine production.  Decreased tear production.  Heartbeat that is irregular or faster than normal (palpitations).  Headache.  Light-headedness, especially when you stand up from a sitting position.  Fainting (syncope). Symptoms of severe dehydration may include:   Changes in skin, such as:  Cold and clammy skin.  Blotchy (mottled) or pale skin.  Skin that does  not quickly return to normal after being lightly pinched and released (poor skin turgor).  Changes in body fluids, such as:  Extreme thirst.  No tear production.  Inability to sweat when body temperature is high, such as in hot weather.  Very little urine production.  Changes in vital signs, such as:  Weak pulse.  Pulse that is more than 100 beats a minute when sitting still.  Rapid breathing.  Low blood pressure.  Other changes, such as:  Sunken eyes.  Cold hands and feet.  Confusion.  Lack of energy (lethargy).  Difficulty waking up from sleep.  Short-term weight loss.  Unconsciousness. How is this diagnosed? This condition is diagnosed based on your symptoms and a physical exam. Blood and urine tests may be done to help confirm the diagnosis. How is this treated? Treatment for this condition depends on the severity. Mild or moderate dehydration can often be treated at home. Treatment should be started right away. Do not wait until dehydration becomes severe. Severe dehydration is an emergency and it needs to be treated in a hospital. Treatment for mild dehydration may include:   Drinking more fluids.  Replacing salts and minerals in your blood (electrolytes) that you may have lost. Treatment for moderate dehydration may include:   Drinking an oral rehydration solution (ORS). This is a drink that helps you replace fluids and electrolytes (rehydrate). It can be found at pharmacies and retail stores. Treatment for severe dehydration may include:   Receiving fluids through an IV tube.  Receiving an electrolyte solution through a feeding tube that is   passed through your nose and into your stomach (nasogastric tube, or NG tube).  Correcting any abnormalities in electrolytes.  Treating the underlying cause of dehydration. Follow these instructions at home:  If directed by your health care provider, drink an ORS:  Make an ORS by following instructions on the  package.  Start by drinking small amounts, about  cup (120 mL) every 5-10 minutes.  Slowly increase how much you drink until you have taken the amount recommended by your health care provider.  Drink enough clear fluid to keep your urine clear or pale yellow. If you were told to drink an ORS, finish the ORS first, then start slowly drinking other clear fluids. Drink fluids such as:  Water. Do not drink only water. Doing that can lead to having too little salt (sodium) in the body (hyponatremia).  Ice chips.  Fruit juice that you have added water to (diluted fruit juice).  Low-calorie sports drinks.  Avoid:  Alcohol.  Drinks that contain a lot of sugar. These include high-calorie sports drinks, fruit juice that is not diluted, and soda.  Caffeine.  Foods that are greasy or contain a lot of fat or sugar.  Take over-the-counter and prescription medicines only as told by your health care provider.  Do not take sodium tablets. This can lead to having too much sodium in the body (hypernatremia).  Eat foods that contain a healthy balance of electrolytes, such as bananas, oranges, potatoes, tomatoes, and spinach.  Keep all follow-up visits as told by your health care provider. This is important. Contact a health care provider if:  You have abdominal pain that:  Gets worse.  Stays in one area (localizes).  You have a rash.  You have a stiff neck.  You are more irritable than usual.  You are sleepier or more difficult to wake up than usual.  You feel weak or dizzy.  You feel very thirsty.  You have urinated only a small amount of very dark urine over 6-8 hours. Get help right away if:  You have symptoms of severe dehydration.  You cannot drink fluids without vomiting.  Your symptoms get worse with treatment.  You have a fever.  You have a severe headache.  You have vomiting or diarrhea that:  Gets worse.  Does not go away.  You have blood or green matter  (bile) in your vomit.  You have blood in your stool. This may cause stool to look black and tarry.  You have not urinated in 6-8 hours.  You faint.  Your heart rate while sitting still is over 100 beats a minute.  You have trouble breathing. This information is not intended to replace advice given to you by your health care provider. Make sure you discuss any questions you have with your health care provider. Document Released: 04/06/2005 Document Revised: 11/01/2015 Document Reviewed: 05/31/2015 Elsevier Interactive Patient Education  2017 [employer]  

## 2016-04-23 NOTE — Progress Notes (Signed)
RN visit only. 

## 2016-04-28 ENCOUNTER — Other Ambulatory Visit: Payer: Self-pay | Admitting: General Surgery

## 2016-04-28 DIAGNOSIS — Z170 Estrogen receptor positive status [ER+]: Secondary | ICD-10-CM

## 2016-04-28 DIAGNOSIS — C50211 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 female breast: Secondary | ICD-10-CM

## 2016-04-30 ENCOUNTER — Telehealth: Payer: Self-pay | Admitting: *Deleted

## 2016-04-30 NOTE — Telephone Encounter (Signed)
Called pt to discuss wishes to speak to alight guide that has recent mastectomy with reconstruction. Informed pt that I would reach out to alight for her request. Pt informed she would give them a call. Further discussed her wanting to schedule sx in 4-6wks after sx. Informed pt that Dr. Lindi Adie was alright with that time line, but if it she wants to wait later we will start her on an AI. Pt relayed she plans to have sx around wk 5 after chemo.  Denies needs further questions or needs at this time.

## 2016-05-05 ENCOUNTER — Ambulatory Visit (HOSPITAL_BASED_OUTPATIENT_CLINIC_OR_DEPARTMENT_OTHER): Payer: BLUE CROSS/BLUE SHIELD

## 2016-05-05 ENCOUNTER — Other Ambulatory Visit (HOSPITAL_BASED_OUTPATIENT_CLINIC_OR_DEPARTMENT_OTHER): Payer: BLUE CROSS/BLUE SHIELD

## 2016-05-05 ENCOUNTER — Ambulatory Visit: Payer: BLUE CROSS/BLUE SHIELD

## 2016-05-05 VITALS — BP 108/72 | HR 79 | Temp 98.2°F | Resp 18

## 2016-05-05 DIAGNOSIS — C50211 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 female breast: Secondary | ICD-10-CM | POA: Diagnosis not present

## 2016-05-05 DIAGNOSIS — Z5112 Encounter for antineoplastic immunotherapy: Secondary | ICD-10-CM | POA: Diagnosis not present

## 2016-05-05 DIAGNOSIS — Z170 Estrogen receptor positive status [ER+]: Secondary | ICD-10-CM

## 2016-05-05 DIAGNOSIS — Z95828 Presence of other vascular implants and grafts: Secondary | ICD-10-CM

## 2016-05-05 DIAGNOSIS — R5081 Fever presenting with conditions classified elsewhere: Principal | ICD-10-CM

## 2016-05-05 DIAGNOSIS — D709 Neutropenia, unspecified: Secondary | ICD-10-CM

## 2016-05-05 LAB — COMPREHENSIVE METABOLIC PANEL
ALBUMIN: 3.9 g/dL (ref 3.5–5.0)
ALK PHOS: 61 U/L (ref 40–150)
ALT: 18 U/L (ref 0–55)
ANION GAP: 9 meq/L (ref 3–11)
AST: 21 U/L (ref 5–34)
BUN: 13.6 mg/dL (ref 7.0–26.0)
CALCIUM: 9.2 mg/dL (ref 8.4–10.4)
CO2: 27 meq/L (ref 22–29)
CREATININE: 0.7 mg/dL (ref 0.6–1.1)
Chloride: 105 mEq/L (ref 98–109)
EGFR: 88 mL/min/{1.73_m2} — ABNORMAL LOW (ref >90)
Glucose: 88 mg/dl (ref 70–140)
Potassium: 3.8 mEq/L (ref 3.5–5.1)
Sodium: 140 mEq/L (ref 136–145)
TOTAL PROTEIN: 6 g/dL — AB (ref 6.4–8.3)
Total Bilirubin: 0.42 mg/dL (ref 0.20–1.20)

## 2016-05-05 LAB — CBC WITH DIFFERENTIAL/PLATELET
BASO%: 0.3 % (ref 0.0–2.0)
Basophils Absolute: 0 10*3/uL (ref 0.0–0.1)
EOS%: 0 % (ref 0.0–7.0)
Eosinophils Absolute: 0 10*3/uL (ref 0.0–0.5)
HCT: 27 % — ABNORMAL LOW (ref 34.8–46.6)
HGB: 9.2 g/dL — ABNORMAL LOW (ref 11.6–15.9)
LYMPH%: 33.4 % (ref 14.0–49.7)
MCH: 32.9 pg (ref 25.1–34.0)
MCHC: 34.1 g/dL (ref 31.5–36.0)
MCV: 96.4 fL (ref 79.5–101.0)
MONO#: 0.4 10*3/uL (ref 0.1–0.9)
MONO%: 11.9 % (ref 0.0–14.0)
NEUT%: 54.4 % (ref 38.4–76.8)
NEUTROS ABS: 2 10*3/uL (ref 1.5–6.5)
NRBC: 0 % (ref 0–0)
PLATELETS: 127 10*3/uL — AB (ref 145–400)
RBC: 2.8 10*6/uL — AB (ref 3.70–5.45)
RDW: 16.2 % — AB (ref 11.2–14.5)
WBC: 3.6 10*3/uL — AB (ref 3.9–10.3)
lymph#: 1.2 10*3/uL (ref 0.9–3.3)

## 2016-05-05 MED ORDER — DIPHENHYDRAMINE HCL 25 MG PO CAPS
ORAL_CAPSULE | ORAL | Status: AC
  Filled 2016-05-05: qty 2

## 2016-05-05 MED ORDER — ACETAMINOPHEN 325 MG PO TABS
ORAL_TABLET | ORAL | Status: AC
  Filled 2016-05-05: qty 2

## 2016-05-05 MED ORDER — DIPHENHYDRAMINE HCL 25 MG PO CAPS
25.0000 mg | ORAL_CAPSULE | Freq: Once | ORAL | Status: AC
  Administered 2016-05-05: 25 mg via ORAL

## 2016-05-05 MED ORDER — HEPARIN SOD (PORK) LOCK FLUSH 100 UNIT/ML IV SOLN
500.0000 [IU] | Freq: Once | INTRAVENOUS | Status: AC | PRN
  Administered 2016-05-05: 500 [IU]
  Filled 2016-05-05: qty 5

## 2016-05-05 MED ORDER — SODIUM CHLORIDE 0.9 % IV SOLN
420.0000 mg | Freq: Once | INTRAVENOUS | Status: AC
  Administered 2016-05-05: 420 mg via INTRAVENOUS
  Filled 2016-05-05: qty 14

## 2016-05-05 MED ORDER — SODIUM CHLORIDE 0.9% FLUSH
10.0000 mL | INTRAVENOUS | Status: DC | PRN
  Administered 2016-05-05: 10 mL
  Filled 2016-05-05: qty 10

## 2016-05-05 MED ORDER — SODIUM CHLORIDE 0.9 % IV SOLN
Freq: Once | INTRAVENOUS | Status: AC
  Administered 2016-05-05: 12:00:00 via INTRAVENOUS

## 2016-05-05 MED ORDER — SODIUM CHLORIDE 0.9 % IJ SOLN
10.0000 mL | INTRAMUSCULAR | Status: DC | PRN
  Administered 2016-05-05: 10 mL via INTRAVENOUS
  Filled 2016-05-05: qty 10

## 2016-05-05 MED ORDER — ACETAMINOPHEN 325 MG PO TABS
650.0000 mg | ORAL_TABLET | Freq: Once | ORAL | Status: AC
  Administered 2016-05-05: 650 mg via ORAL

## 2016-05-05 MED ORDER — TRASTUZUMAB CHEMO 150 MG IV SOLR
6.0000 mg/kg | Freq: Once | INTRAVENOUS | Status: AC
  Administered 2016-05-05: 378 mg via INTRAVENOUS
  Filled 2016-05-05: qty 18

## 2016-05-05 NOTE — Patient Instructions (Signed)
[facility] Discharge Instructions for Patients Receiving Chemotherapy  Today you received the following chemotherapy agents Herceptin/Perjeta  To help prevent nausea and vomiting after your treatment, we encourage you to take your nausea medication    If you develop nausea and vomiting that is not controlled by your nausea medication, call the clinic.   BELOW ARE SYMPTOMS THAT SHOULD BE REPORTED IMMEDIATELY:  *FEVER GREATER THAN 100.5 F  *CHILLS WITH OR WITHOUT FEVER  NAUSEA AND VOMITING THAT IS NOT CONTROLLED WITH YOUR NAUSEA MEDICATION  *UNUSUAL SHORTNESS OF BREATH  *UNUSUAL BRUISING OR BLEEDING  TENDERNESS IN MOUTH AND THROAT WITH OR WITHOUT PRESENCE OF ULCERS  *URINARY PROBLEMS  *BOWEL PROBLEMS  UNUSUAL RASH Items with * indicate a potential emergency and should be followed up as soon as possible.  Feel free to call the clinic you have any questions or concerns. The clinic phone number is [phone].  Please show the CHEMO ALERT CARD at check-in to the Emergency Department and triage nurse.   

## 2016-05-06 ENCOUNTER — Other Ambulatory Visit: Payer: BLUE CROSS/BLUE SHIELD

## 2016-05-06 ENCOUNTER — Ambulatory Visit: Payer: BLUE CROSS/BLUE SHIELD

## 2016-05-12 ENCOUNTER — Encounter (HOSPITAL_BASED_OUTPATIENT_CLINIC_OR_DEPARTMENT_OTHER): Payer: Self-pay | Admitting: *Deleted

## 2016-05-12 NOTE — H&P (Signed)
Subjective:     Patient ID: Patricia Chandler is a 61 y.o. female.  HPI  Here for follow up discussion breast reconstruction. Presented following diagnostic MMG 6 month follow up following benign right breast biospy 2015. Two masses noted : a 1.7 cm mass located within the right breast at 2 o'clock position 4 cm from the nipple and a 1.3 cm mass located within the right breast at 2 o'clock position 6 cm from the nipple.  7 mm satellite nodule located between these 2 masses. [place] axilla negative. The tumor that is 4cm from the nipple at 2:00 position was a IDC, ER/PR +,  HER-2 +. The second tumor which measured 1.3 cm which is at 6 cm from the nipple is IDC ER/PR +HER-2 -.  MRI showed right UIQ 3 adjacent enhancing masses measuring 1.7 x 1.5 x 1.4 cm, 1.0 x 0.7 x 0.6 cm and 1.4 x 1.3 x 1.3 cm. The masses span an area of 3.7 x 1.4 x 1.4 cm. A well-circumscribed 8 mm enhancing mass in the upper-outer quadrant of the right breast corresponding with an intramammary lymph node that has been stable since 2013. Completed neoadjuvant chemotherapy. Final MRI with resolution of the 2 biopsied malignancies. There is a tiny mass which correlates with the third satellite lesion is smaller in the interval measuring 5.3 by 3.7 mm in AP and transverse dimension today versus 10 x 7 mm in AP and transverse dimension previously. She has met with Patricia Chandler and has elected for nipple sparing mastectomy.  Genetics negative  History significant for augmentation 2005 with Dr. Wendy Poet. Patient has no information regarding her implants, states they are saline. Prior to augmentation A cup, Current D. "Could go down a size." Since last visit weight down 30 lb.  Review of Systems     Objective:   Physical Exam  Cardiovascular: Normal rate, regular rhythm and normal heart sounds.   Pulmonary/Chest: Effort normal and breath sounds normal.  Lymphadenopathy:    She has no axillary adenopathy.    Pseudoptposis Right transverse scar below NAC and nipple scar superiorly, left nipple scar and periareolar scar >6 cm soft tissue pinch over implants/muscle Bilateral animation   SN to nipple R 22.5 L 23 cm BW R 16.5 L 16.5 Nipple to IMF R 8.5 L 10  Assessment:     Right breast ca multifocal UIQ History augmentation mammaplasty    Plan:     New pictures today.  Plan NSM, I recommend placement expander possible acellular dermis. Reviewed would be in same submuscular/dual plane location, post mastectomy animation deformity would be more noticeable. Also discussed that risks visible rippling. She reports saline implants and states she may want opposite breast implant replaced during future implant exchange as they are 63 years old and she feels risk rupture will reset if she exchanges. Counseled that this is not necessary but will revisit when we get to that point. We also discussed that the expander will be very firm, not feel like implant. Also discussed in future, things like massage of implants not recommended as we want implants to remain in position. She related the fact that she has not had capsular contracture to her massage implants. Counseled contracture is a risk life time with breast implants and is higher in reconstruction vs cosmetic surgery. She notes part of reason she elected for mastectomy was to avoid radiation to implants.  Reviewed SSM vs NSM, both will be asensate and not stimulate. She notes she has lost  most nipple sensation following nipple reduction surgery. Reviewed with both risks mastectomy flap necrosis requiring additional surgery, possible removal expander and delayed reconstruction. She asked if I believed the risk of mastectomy flap necrosis was 1%- counseled my of my own patients I suspect the incidence has been higher but believe < 10%. Reviewed drains, hospital stay, recovery, process of expansion, timing of future surgery for implant exchange and and  symmetry procedures. Reviewed use acellular dermis in breast reconstruction, cadaveric source, risk that in setting of infection can act as additional source infection until it has incorporated. Reviewed alternative using own muscle. She is ok with ADM if necessary.  She expressed significant concern that she is not ready for surgery as she is weak, states she felt she had to sit even here when checking in for appt. She is having difficulty balancing surgery timing with risk tumor regrowing. Discussed that she will improve with regards to strength each week, her labs this past week would be suitable for surgery, but will review with Patricia Chandler.  Irene Limbo, MD [facility] [phone], pin [phone]

## 2016-05-15 ENCOUNTER — Other Ambulatory Visit: Payer: Self-pay | Admitting: Hematology and Oncology

## 2016-05-15 MED FILL — oxyCODONE HCL 5 MG TABS: 5 | 4 days supply | Qty: 40 | Fill #0

## 2016-05-15 MED FILL — ALPRAZolam 0.5 MG TABS: 0.5 | 60 days supply | Qty: 60 | Fill #0

## 2016-05-15 NOTE — Progress Notes (Signed)
Pt took Boost with instructions to have taken by 0500 hrs DOS.  NPO otherwise. Pt verbalized understanding.

## 2016-05-18 ENCOUNTER — Other Ambulatory Visit: Payer: Self-pay | Admitting: Hematology and Oncology

## 2016-05-19 ENCOUNTER — Ambulatory Visit (HOSPITAL_BASED_OUTPATIENT_CLINIC_OR_DEPARTMENT_OTHER): Payer: BLUE CROSS/BLUE SHIELD | Admitting: Anesthesiology

## 2016-05-19 ENCOUNTER — Encounter (HOSPITAL_BASED_OUTPATIENT_CLINIC_OR_DEPARTMENT_OTHER)
Admission: RE | Disposition: A | Discharge status: Home / Self Care | Payer: Self-pay | Source: Ambulatory Visit | Attending: General Surgery

## 2016-05-19 ENCOUNTER — Ambulatory Visit (HOSPITAL_COMMUNITY)
Admission: RE | Admit: 2016-05-19 | Discharge: 2016-05-19 | Disposition: A | Discharge status: Home / Self Care | Payer: BLUE CROSS/BLUE SHIELD | Source: Ambulatory Visit | Attending: General Surgery | Admitting: General Surgery

## 2016-05-19 ENCOUNTER — Ambulatory Visit (HOSPITAL_BASED_OUTPATIENT_CLINIC_OR_DEPARTMENT_OTHER)
Admission: RE | Admit: 2016-05-19 | Discharge: 2016-05-20 | Disposition: A | Discharge status: Home / Self Care | Payer: BLUE CROSS/BLUE SHIELD | Source: Ambulatory Visit | Attending: General Surgery | Admitting: General Surgery

## 2016-05-19 ENCOUNTER — Encounter (HOSPITAL_BASED_OUTPATIENT_CLINIC_OR_DEPARTMENT_OTHER): Payer: Self-pay | Admitting: *Deleted

## 2016-05-19 DIAGNOSIS — Z803 Family history of malignant neoplasm of breast: Secondary | ICD-10-CM | POA: Insufficient documentation

## 2016-05-19 DIAGNOSIS — Z170 Estrogen receptor positive status [ER+]: Secondary | ICD-10-CM

## 2016-05-19 DIAGNOSIS — Z9221 Personal history of antineoplastic chemotherapy: Secondary | ICD-10-CM | POA: Insufficient documentation

## 2016-05-19 DIAGNOSIS — C50211 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 female breast: Secondary | ICD-10-CM | POA: Insufficient documentation

## 2016-05-19 DIAGNOSIS — F419 Anxiety disorder, unspecified: Secondary | ICD-10-CM | POA: Insufficient documentation

## 2016-05-19 DIAGNOSIS — Z8719 Personal history of other diseases of the digestive system: Secondary | ICD-10-CM | POA: Diagnosis not present

## 2016-05-19 DIAGNOSIS — K219 Gastro-esophageal reflux disease without esophagitis: Secondary | ICD-10-CM | POA: Insufficient documentation

## 2016-05-19 DIAGNOSIS — C50919 Malignant neoplasm of unspecified site of unspecified female breast: Secondary | ICD-10-CM | POA: Diagnosis present

## 2016-05-19 DIAGNOSIS — D630 Anemia in neoplastic disease: Secondary | ICD-10-CM | POA: Diagnosis not present

## 2016-05-19 DIAGNOSIS — R011 Cardiac murmur, unspecified: Secondary | ICD-10-CM | POA: Insufficient documentation

## 2016-05-19 HISTORY — PX: NIPPLE SPARING MASTECTOMY/SENTINAL LYMPH NODE BIOPSY/RECONSTRUCTION/PLACEMENT OF TISSUE EXPANDER: SHX6484

## 2016-05-19 HISTORY — DX: Gastro-esophageal reflux disease without esophagitis: K21.9

## 2016-05-19 HISTORY — DX: Anxiety disorder, unspecified: F41.9

## 2016-05-19 HISTORY — PX: BREAST RECONSTRUCTION WITH PLACEMENT OF TISSUE EXPANDER AND FLEX HD (ACELLULAR HYDRATED DERMIS): SHX6295

## 2016-05-19 LAB — CBC
HCT: 28.5 % — ABNORMAL LOW (ref 36.0–46.0)
Hemoglobin: 9.4 g/dL — ABNORMAL LOW (ref 12.0–15.0)
MCH: 32.3 pg (ref 26.0–34.0)
MCHC: 33 g/dL (ref 30.0–36.0)
MCV: 97.9 fL (ref 78.0–100.0)
PLATELETS: 178 10*3/uL (ref 150–400)
RBC: 2.91 MIL/uL — AB (ref 3.87–5.11)
RDW: 15.3 % (ref 11.5–15.5)
WBC: 3.2 10*3/uL — AB (ref 4.0–10.5)

## 2016-05-19 SURGERY — NIPPLE SPARING MASTECTOMY WITH SENTINAL LYMPH NODE BIOPSY AND  RECONSTRUCTION WITH PLACEMENT OF TISSUE EXPANDER
Anesthesia: Regional | Site: Chest | Laterality: Right

## 2016-05-19 MED ORDER — BUPIVACAINE-EPINEPHRINE (PF) 0.5% -1:200000 IJ SOLN
INTRAMUSCULAR | Status: DC | PRN
  Administered 2016-05-19: 25 mL

## 2016-05-19 MED ORDER — MIDAZOLAM HCL 2 MG/2ML IJ SOLN
1.0000 mg | INTRAMUSCULAR | Status: DC | PRN
  Administered 2016-05-19: 1 mg via INTRAVENOUS

## 2016-05-19 MED ORDER — IBUPROFEN 400 MG PO TABS: 400.0000 mg | ORAL_TABLET | Freq: Four times a day (QID) | ORAL | Status: DC | PRN

## 2016-05-19 MED ORDER — LIDOCAINE HCL (CARDIAC) 20 MG/ML IV SOLN
INTRAVENOUS | Status: DC | PRN
  Administered 2016-05-19: 50 mg via INTRAVENOUS

## 2016-05-19 MED ORDER — SUCCINYLCHOLINE CHLORIDE 20 MG/ML IJ SOLN
INTRAMUSCULAR | Status: DC | PRN
  Administered 2016-05-19: 100 mg via INTRAVENOUS

## 2016-05-19 MED ORDER — LACTATED RINGERS IV SOLN
INTRAVENOUS | Status: DC
  Administered 2016-05-19 (×2): via INTRAVENOUS

## 2016-05-19 MED ORDER — SODIUM CHLORIDE 0.9 % IV SOLN
INTRAVENOUS | Status: DC
Start: 2016-05-19 — End: 2016-05-20
  Administered 2016-05-19: 13:00:00 via INTRAVENOUS

## 2016-05-19 MED ORDER — ONDANSETRON 4 MG PO TBDP: 4.0000 mg | ORAL_TABLET | Freq: Four times a day (QID) | ORAL | Status: DC | PRN

## 2016-05-19 MED ORDER — ONDANSETRON HCL 4 MG/2ML IJ SOLN
INTRAMUSCULAR | Status: AC
  Filled 2016-05-19: qty 2

## 2016-05-19 MED ORDER — SODIUM CHLORIDE 0.9 % IJ SOLN
INTRAVENOUS | Status: DC | PRN
  Administered 2016-05-19: 5 mL via INTRAMUSCULAR

## 2016-05-19 MED ORDER — FENTANYL CITRATE (PF) 100 MCG/2ML IJ SOLN
INTRAMUSCULAR | Status: AC
  Filled 2016-05-19: qty 2

## 2016-05-19 MED ORDER — MIDAZOLAM HCL 2 MG/2ML IJ SOLN
INTRAMUSCULAR | Status: AC
  Filled 2016-05-19: qty 2

## 2016-05-19 MED ORDER — PANTOPRAZOLE SODIUM 40 MG PO TBEC
40.0000 mg | DELAYED_RELEASE_TABLET | Freq: Two times a day (BID) | ORAL | Status: DC
  Administered 2016-05-19: 40 mg via ORAL

## 2016-05-19 MED ORDER — SCOPOLAMINE 1 MG/3DAYS TD PT72: 1.0000 | MEDICATED_PATCH | Freq: Once | TRANSDERMAL | Status: DC | PRN

## 2016-05-19 MED ORDER — TECHNETIUM TC 99M SULFUR COLLOID FILTERED
1.0000 | Freq: Once | INTRAVENOUS | Status: AC | PRN
  Administered 2016-05-19: 1 via INTRADERMAL

## 2016-05-19 MED ORDER — SODIUM CHLORIDE 0.9 % IJ SOLN
INTRAMUSCULAR | Status: AC
  Filled 2016-05-19: qty 10

## 2016-05-19 MED ORDER — EPHEDRINE 5 MG/ML INJ
INTRAVENOUS | Status: AC
  Filled 2016-05-19: qty 10

## 2016-05-19 MED ORDER — GABAPENTIN 300 MG PO CAPS
ORAL_CAPSULE | ORAL | Status: AC
  Filled 2016-05-19: qty 1

## 2016-05-19 MED ORDER — SODIUM CHLORIDE 0.9 % IV SOLN
INTRAVENOUS | Status: DC | PRN
  Administered 2016-05-19: 1000 mL

## 2016-05-19 MED ORDER — CEFAZOLIN SODIUM-DEXTROSE 2-4 GM/100ML-% IV SOLN
2.0000 g | Freq: Three times a day (TID) | INTRAVENOUS | Status: AC
  Administered 2016-05-19 – 2016-05-20 (×2): 2 g via INTRAVENOUS
  Filled 2016-05-19 (×2): qty 100

## 2016-05-19 MED ORDER — METHYLENE BLUE 0.5 % INJ SOLN
INTRAVENOUS | Status: AC
  Filled 2016-05-19: qty 10

## 2016-05-19 MED ORDER — DEXAMETHASONE SODIUM PHOSPHATE 4 MG/ML IJ SOLN
INTRAMUSCULAR | Status: DC | PRN
  Administered 2016-05-19: 10 mg via INTRAVENOUS

## 2016-05-19 MED ORDER — LIDOCAINE 2% (20 MG/ML) 5 ML SYRINGE
INTRAMUSCULAR | Status: AC
  Filled 2016-05-19: qty 5

## 2016-05-19 MED ORDER — HYDROMORPHONE HCL 1 MG/ML IJ SOLN: 0.2500 mg | INTRAMUSCULAR | Status: DC | PRN

## 2016-05-19 MED ORDER — ALBUMIN HUMAN 5 % IV SOLN
INTRAVENOUS | Status: DC | PRN
  Administered 2016-05-19: 11:00:00 via INTRAVENOUS

## 2016-05-19 MED ORDER — DEXAMETHASONE SODIUM PHOSPHATE 10 MG/ML IJ SOLN
INTRAMUSCULAR | Status: AC
  Filled 2016-05-19: qty 1

## 2016-05-19 MED ORDER — OXYCODONE HCL 5 MG PO TABS
5.0000 mg | ORAL_TABLET | ORAL | Status: DC | PRN
  Administered 2016-05-19 – 2016-05-20 (×5): 10 mg via ORAL
  Filled 2016-05-19 (×4): qty 2

## 2016-05-19 MED ORDER — ACETAMINOPHEN 650 MG RE SUPP: 650.0000 mg | Freq: Four times a day (QID) | RECTAL | Status: DC | PRN

## 2016-05-19 MED ORDER — PROPOFOL 500 MG/50ML IV EMUL
INTRAVENOUS | Status: AC
  Filled 2016-05-19: qty 50

## 2016-05-19 MED ORDER — CEFAZOLIN SODIUM-DEXTROSE 2-4 GM/100ML-% IV SOLN
INTRAVENOUS | Status: AC
  Filled 2016-05-19: qty 100

## 2016-05-19 MED ORDER — ACETAMINOPHEN 500 MG PO TABS
1000.0000 mg | ORAL_TABLET | ORAL | Status: AC
  Administered 2016-05-19: 1000 mg via ORAL

## 2016-05-19 MED ORDER — PROPOFOL 10 MG/ML IV BOLUS
INTRAVENOUS | Status: DC | PRN
  Administered 2016-05-19: 150 mg via INTRAVENOUS

## 2016-05-19 MED ORDER — PROMETHAZINE HCL 25 MG/ML IJ SOLN: 6.2500 mg | INTRAMUSCULAR | Status: DC | PRN

## 2016-05-19 MED ORDER — EPHEDRINE SULFATE 50 MG/ML IJ SOLN
INTRAMUSCULAR | Status: DC | PRN
  Administered 2016-05-19: 5 mg via INTRAVENOUS

## 2016-05-19 MED ORDER — PHENYLEPHRINE HCL 10 MG/ML IJ SOLN
INTRAMUSCULAR | Status: DC | PRN
  Administered 2016-05-19: 80 ug via INTRAVENOUS
  Administered 2016-05-19 (×3): 40 ug via INTRAVENOUS
  Administered 2016-05-19: 80 ug via INTRAVENOUS

## 2016-05-19 MED ORDER — FENTANYL CITRATE (PF) 100 MCG/2ML IJ SOLN
50.0000 ug | INTRAMUSCULAR | Status: DC | PRN
  Administered 2016-05-19: 50 ug via INTRAVENOUS
  Administered 2016-05-19: 100 ug via INTRAVENOUS

## 2016-05-19 MED ORDER — ONDANSETRON HCL 4 MG/2ML IJ SOLN
INTRAMUSCULAR | Status: DC | PRN
  Administered 2016-05-19: 4 mg via INTRAVENOUS

## 2016-05-19 MED ORDER — MORPHINE SULFATE (PF) 2 MG/ML IV SOLN
2.0000 mg | INTRAVENOUS | Status: DC | PRN
  Administered 2016-05-20: 2 mg via INTRAVENOUS

## 2016-05-19 MED ORDER — ACETAMINOPHEN 325 MG PO TABS: 650.0000 mg | ORAL_TABLET | Freq: Four times a day (QID) | ORAL | Status: DC | PRN

## 2016-05-19 MED ORDER — ALBUMIN HUMAN 5 % IV SOLN
INTRAVENOUS | Status: AC
  Filled 2016-05-19: qty 250

## 2016-05-19 MED ORDER — CHLORHEXIDINE GLUCONATE CLOTH 2 % EX PADS: 6.0000 | MEDICATED_PAD | Freq: Once | CUTANEOUS | Status: DC

## 2016-05-19 MED ORDER — METHOCARBAMOL 500 MG PO TABS
500.0000 mg | ORAL_TABLET | Freq: Four times a day (QID) | ORAL | Status: DC | PRN
  Administered 2016-05-19 – 2016-05-20 (×3): 500 mg via ORAL
  Filled 2016-05-19 (×2): qty 1

## 2016-05-19 MED ORDER — ALPRAZOLAM 0.25 MG PO TABS
0.2500 mg | ORAL_TABLET | Freq: Every evening | ORAL | Status: DC | PRN
  Administered 2016-05-20: 0.25 mg via ORAL
  Filled 2016-05-19: qty 1

## 2016-05-19 MED ORDER — SIMETHICONE 80 MG PO CHEW: 40.0000 mg | CHEWABLE_TABLET | Freq: Four times a day (QID) | ORAL | Status: DC | PRN

## 2016-05-19 MED ORDER — CEFAZOLIN SODIUM-DEXTROSE 2-4 GM/100ML-% IV SOLN
2.0000 g | INTRAVENOUS | Status: AC
  Administered 2016-05-19: 2 g via INTRAVENOUS

## 2016-05-19 MED ORDER — GABAPENTIN 300 MG PO CAPS
300.0000 mg | ORAL_CAPSULE | ORAL | Status: AC
  Administered 2016-05-19: 300 mg via ORAL

## 2016-05-19 MED ORDER — PHENYLEPHRINE 40 MCG/ML (10ML) SYRINGE FOR IV PUSH (FOR BLOOD PRESSURE SUPPORT)
PREFILLED_SYRINGE | INTRAVENOUS | Status: AC
  Filled 2016-05-19: qty 10

## 2016-05-19 MED ORDER — ACETAMINOPHEN 500 MG PO TABS
ORAL_TABLET | ORAL | Status: AC
  Filled 2016-05-19: qty 2

## 2016-05-19 MED ORDER — ONDANSETRON HCL 4 MG/2ML IJ SOLN: 4.0000 mg | Freq: Four times a day (QID) | INTRAMUSCULAR | Status: DC | PRN

## 2016-05-19 SURGICAL SUPPLY — 110 items
ADH SKN CLS APL DERMABOND .7 (GAUZE/BANDAGES/DRESSINGS)
ALLODERM 8X16 READY TO USE (Tissue) ×1 IMPLANT
ALLODERM RTU 8X16 (Tissue) ×2 IMPLANT
APL SKNCLS STERI-STRIP NONHPOA (GAUZE/BANDAGES/DRESSINGS)
APPLIER CLIP 11 MED OPEN (CLIP) ×3
APR CLP MED 11 20 MLT OPN (CLIP) ×2
BAG DECANTER FOR FLEXI CONT (MISCELLANEOUS) ×5 IMPLANT
BENZOIN TINCTURE PRP APPL 2/3 (GAUZE/BANDAGES/DRESSINGS) IMPLANT
BINDER BREAST LRG (GAUZE/BANDAGES/DRESSINGS) ×4 IMPLANT
BINDER BREAST MEDIUM (GAUZE/BANDAGES/DRESSINGS) IMPLANT
BINDER BREAST XLRG (GAUZE/BANDAGES/DRESSINGS) IMPLANT
BINDER BREAST XXLRG (GAUZE/BANDAGES/DRESSINGS) IMPLANT
BIOPATCH RED 1 DISK 7.0 (GAUZE/BANDAGES/DRESSINGS) ×2 IMPLANT
BLADE CLIPPER SURG (BLADE) IMPLANT
BLADE HEX COATED 2.75 (ELECTRODE) ×3 IMPLANT
BLADE SURG 10 STRL SS (BLADE) ×5 IMPLANT
BLADE SURG 15 STRL LF DISP TIS (BLADE) ×2 IMPLANT
BLADE SURG 15 STRL SS (BLADE) ×3
BNDG GAUZE ELAST 4 BULKY (GAUZE/BANDAGES/DRESSINGS) ×6 IMPLANT
CANISTER SUCT 1200ML W/VALVE (MISCELLANEOUS) ×6 IMPLANT
CHLORAPREP W/TINT 26ML (MISCELLANEOUS) ×4 IMPLANT
CLIP APPLIE 11 MED OPEN (CLIP) ×2 IMPLANT
CLIP TI WIDE RED SMALL 6 (CLIP) IMPLANT
COVER BACK TABLE 60X90IN (DRAPES) ×5 IMPLANT
COVER MAYO STAND STRL (DRAPES) ×5 IMPLANT
COVER PROBE W GEL 5X96 (DRAPES) ×3 IMPLANT
DECANTER SPIKE VIAL GLASS SM (MISCELLANEOUS) IMPLANT
DERMABOND ADVANCED (GAUZE/BANDAGES/DRESSINGS)
DERMABOND ADVANCED .7 DNX12 (GAUZE/BANDAGES/DRESSINGS) ×4 IMPLANT
DEVICE DISSECT PLASMABLAD 3.0S (MISCELLANEOUS) IMPLANT
DRAIN CHANNEL 15F RND FF W/TCR (WOUND CARE) ×2 IMPLANT
DRAIN CHANNEL 19F RND (DRAIN) ×1 IMPLANT
DRAPE LAPAROSCOPIC ABDOMINAL (DRAPES) IMPLANT
DRAPE TOP ARMCOVERS (MISCELLANEOUS) ×3 IMPLANT
DRAPE U-SHAPE 76X120 STRL (DRAPES) ×3 IMPLANT
DRAPE UTILITY XL STRL (DRAPES) ×3 IMPLANT
DRSG PAD ABDOMINAL 8X10 ST (GAUZE/BANDAGES/DRESSINGS) ×7 IMPLANT
DRSG TEGADERM 2-3/8X2-3/4 SM (GAUZE/BANDAGES/DRESSINGS) ×2 IMPLANT
DRSG TEGADERM 4X10 (GAUZE/BANDAGES/DRESSINGS) ×1 IMPLANT
DRSG TEGADERM 4X4.75 (GAUZE/BANDAGES/DRESSINGS) ×1 IMPLANT
ELECT BLADE 4.0 EZ CLEAN MEGAD (MISCELLANEOUS) ×3
ELECT BLADE 6.5 .24CM SHAFT (ELECTRODE) IMPLANT
ELECT COATED BLADE 2.86 ST (ELECTRODE) ×1 IMPLANT
ELECT REM PT RETURN 9FT ADLT (ELECTROSURGICAL) ×3
ELECTRODE BLDE 4.0 EZ CLN MEGD (MISCELLANEOUS) ×2 IMPLANT
ELECTRODE REM PT RTRN 9FT ADLT (ELECTROSURGICAL) ×4 IMPLANT
EVACUATOR SILICONE 100CC (DRAIN) ×5 IMPLANT
EXPANDER TISSUE MX 500CC (Prosthesis & Implant Plastic) IMPLANT
GAUZE SPONGE 4X4 12PLY STRL (GAUZE/BANDAGES/DRESSINGS) ×3 IMPLANT
GLOVE BIO SURGEON STRL SZ 6 (GLOVE) ×10 IMPLANT
GLOVE BIO SURGEON STRL SZ 6.5 (GLOVE) ×3 IMPLANT
GLOVE BIO SURGEON STRL SZ7 (GLOVE) ×3 IMPLANT
GLOVE BIOGEL PI IND STRL 7.0 (GLOVE) ×1 IMPLANT
GLOVE BIOGEL PI IND STRL 7.5 (GLOVE) ×2 IMPLANT
GLOVE BIOGEL PI INDICATOR 7.0 (GLOVE) ×1
GLOVE BIOGEL PI INDICATOR 7.5 (GLOVE) ×1
GLOVE EXAM NITRILE EXT CUFF MD (GLOVE) ×1 IMPLANT
GOWN STRL REUS W/ TWL LRG LVL3 (GOWN DISPOSABLE) ×8 IMPLANT
GOWN STRL REUS W/TWL LRG LVL3 (GOWN DISPOSABLE) ×12
HEMOSTAT ARISTA ABSORB 3G PWDR (MISCELLANEOUS) IMPLANT
ILLUMINATOR WAVEGUIDE N/F (MISCELLANEOUS) IMPLANT
IV NS 500ML (IV SOLUTION) ×3
IV NS 500ML BAXH (IV SOLUTION) ×3 IMPLANT
KIT FILL SYSTEM UNIVERSAL (SET/KITS/TRAYS/PACK) ×5 IMPLANT
KIT MARKER MARGIN INK (KITS) IMPLANT
LIGHT WAVEGUIDE WIDE FLAT (MISCELLANEOUS) ×1 IMPLANT
LIQUID BAND (GAUZE/BANDAGES/DRESSINGS) ×6 IMPLANT
NDL HYPO 25X1 1.5 SAFETY (NEEDLE) IMPLANT
NDL SAFETY ECLIPSE 18X1.5 (NEEDLE) ×2 IMPLANT
NEEDLE HYPO 18GX1.5 SHARP (NEEDLE) ×3
NEEDLE HYPO 25X1 1.5 SAFETY (NEEDLE) ×3 IMPLANT
NS IRRIG 1000ML POUR BTL (IV SOLUTION) ×3 IMPLANT
PACK BASIN DAY SURGERY FS (CUSTOM PROCEDURE TRAY) ×4 IMPLANT
PACK UNIVERSAL I (CUSTOM PROCEDURE TRAY) IMPLANT
PENCIL BUTTON HOLSTER BLD 10FT (ELECTRODE) ×5 IMPLANT
PIN SAFETY STERILE (MISCELLANEOUS) ×3 IMPLANT
PLASMABLADE 3.0S (MISCELLANEOUS)
SHEET MEDIUM DRAPE 40X70 STRL (DRAPES) ×3 IMPLANT
SLEEVE SCD COMPRESS KNEE MED (MISCELLANEOUS) ×5 IMPLANT
SPONGE LAP 18X18 X RAY DECT (DISPOSABLE) ×12 IMPLANT
STAPLER VISISTAT 35W (STAPLE) ×2 IMPLANT
STRIP CLOSURE SKIN 1/2X4 (GAUZE/BANDAGES/DRESSINGS) IMPLANT
SUT ETHIBOND 2-0 V-5 NDL (SUTURE) IMPLANT
SUT ETHIBOND 2-0 V-5 NEEDLE (SUTURE) IMPLANT
SUT ETHILON 2 0 FS 18 (SUTURE) ×2 IMPLANT
SUT ETHILON 3 0 PS 1 (SUTURE) ×1 IMPLANT
SUT MNCRL AB 3-0 PS2 18 (SUTURE) IMPLANT
SUT MNCRL AB 4-0 PS2 18 (SUTURE) ×5 IMPLANT
SUT MON AB 5-0 PS2 18 (SUTURE) IMPLANT
SUT PDS AB 2-0 CT2 27 (SUTURE) ×1 IMPLANT
SUT SILK 2 0 SH (SUTURE) IMPLANT
SUT SILK 3 0 PS 1 (SUTURE) ×1 IMPLANT
SUT VIC AB 0 CT1 27 (SUTURE) ×3
SUT VIC AB 0 CT1 27XBRD ANBCTR (SUTURE) IMPLANT
SUT VIC AB 3-0 SH 27 (SUTURE) ×15
SUT VIC AB 3-0 SH 27X BRD (SUTURE) ×2 IMPLANT
SUT VICRYL 4-0 PS2 18IN ABS (SUTURE) ×3 IMPLANT
SUT VLOC 180 0 24IN GS25 (SUTURE) IMPLANT
SYR 50ML LL SCALE MARK (SYRINGE) IMPLANT
SYR BULB IRRIGATION 50ML (SYRINGE) ×7 IMPLANT
SYR CONTROL 10ML LL (SYRINGE) ×1 IMPLANT
TAPE MEASURE VINYL STERILE (MISCELLANEOUS) ×3 IMPLANT
TISSUE ALLDRM RTU 8X16 (Tissue) IMPLANT
TISSUE EXPANDER MX 500CC (Prosthesis & Implant Plastic) ×3 IMPLANT
TOWEL OR 17X24 6PK STRL BLUE (TOWEL DISPOSABLE) ×8 IMPLANT
TRAY FOLEY BAG SILVER LF 14FR (SET/KITS/TRAYS/PACK) IMPLANT
TRAY FOLEY BAG SILVER LF 16FR (SET/KITS/TRAYS/PACK) IMPLANT
TUBE CONNECTING 20X1/4 (TUBING) ×5 IMPLANT
UNDERPAD 30X30 (UNDERPADS AND DIAPERS) ×7 IMPLANT
YANKAUER SUCT BULB TIP NO VENT (SUCTIONS) ×4 IMPLANT

## 2016-05-19 NOTE — Progress Notes (Signed)
Assisted Dr. Massagee with right, ultrasound guided, pectoralis block. Side rails up, monitors on throughout procedure. See vital signs in flow sheet. Tolerated Procedure well. 

## 2016-05-19 NOTE — Discharge Instructions (Addendum)
About my Jackson-Pratt Bulb Drain  What is a Jackson-Pratt bulb? A Jackson-Pratt is a soft, round device used to collect drainage. It is connected to a long, thin drainage catheter, which is held in place by one or two small stiches near your surgical incision site. When the bulb is squeezed, it forms a vacuum, forcing the drainage to empty into the bulb.  Emptying the Jackson-Pratt bulb- To empty the bulb: 1. Release the plug on the top of the bulb. 2. Pour the bulb's contents into a measuring container which your nurse will provide. 3. Record the time emptied and amount of drainage. Empty the drain(s) as often as your     doctor or nurse recommends.  Date                  Time                    Amount (Drain 1)                 Amount (Drain 2)  _____________________________________________________________________  _____________________________________________________________________  _____________________________________________________________________  _____________________________________________________________________  _____________________________________________________________________  _____________________________________________________________________  _____________________________________________________________________  _____________________________________________________________________  Squeezing the Jackson-Pratt Bulb- To squeeze the bulb: 1. Make sure the plug at the top of the bulb is open. 2. Squeeze the bulb tightly in your fist. You will hear air squeezing from the bulb. 3. Replace the plug while the bulb is squeezed. 4. Use a safety pin to attach the bulb to your clothing. This will keep the catheter from     pulling at the bulb insertion site.  When to call your doctor- Call your doctor if:  Drain site becomes red, swollen or hot.  You have a fever greater than 101 degrees F.  There is oozing at the drain site.  Drain falls out (apply a guaze  bandage over the drain hole and secure it with tape).  Drainage increases daily not related to activity patterns. (You will usually have more drainage when you are active than when you are resting.)  Drainage has a bad odor.  [facility], [place] [phone]  MASTECTOMY: POST OP INSTRUCTIONS  Always review your discharge instruction sheet given to you by the facility where your surgery was performed. IF YOU HAVE DISABILITY OR FAMILY LEAVE FORMS, YOU MUST BRING THEM TO THE OFFICE FOR PROCESSING.   DO NOT GIVE THEM TO YOUR DOCTOR. A prescription for pain medication may be given to you upon discharge.  Take your pain medication as prescribed, if needed.  If narcotic pain medicine is not needed, then you may take acetaminophen (Tylenol), naprosyn (Alleve) or ibuprofen (Advil) as needed. 1. Take your usually prescribed medications unless otherwise directed. 2. If you need a refill on your pain medication, please contact your pharmacy.  They will contact our office to request authorization.  Prescriptions will not be filled after 5pm or on week-ends. 3. You should follow a light diet the first few days after arrival home, such as soup and crackers, etc.  Resume your normal diet the day after surgery. 4. Most patients will experience some swelling and bruising on the chest and underarm.  Ice packs will help.  Swelling and bruising can take several days to resolve. Wear the binder day and night until you return to the office.  5. It is common to experience some constipation if taking pain medication after surgery.  Increasing fluid intake and taking a stool softener (such as Colace) will usually help or prevent  this problem from occurring.  A mild laxative (Milk of Magnesia or Miralax) should be taken according to package instructions if there are no bowel movements after 48 hours. 6. Unless discharge instructions indicate otherwise, leave your bandage dry and in place until your next  appointment in 3-5 days.  You may take a limited sponge bath.  No tube baths or showers until the drains are removed.  You may have steri-strips (small skin tapes) in place directly over the incision.  These strips should be left on the skin for 7-10 days. If you have glue it will come off in next couple week.  Any sutures will be removed at an office visit 7. DRAINS:  If you have drains in place, it is important to keep a list of the amount of drainage produced each day in your drains.  Before leaving the hospital, you should be instructed on drain care.  Call our office if you have any questions about your drains. I will remove your drains when they put out less than 30 cc or ml for 2 consecutive days. 8. ACTIVITIES:  You may resume regular (light) daily activities beginning the next day--such as daily self-care, walking, climbing stairs--gradually increasing activities as tolerated.  You may have sexual intercourse when it is comfortable.  Refrain from any heavy lifting or straining until approved by your doctor. a. You may drive when you are no longer taking prescription pain medication, you can comfortably wear a seatbelt, and you can safely maneuver your car and apply brakes. b. RETURN TO WORK:  __________________________________________________________ 9. You should see your doctor in the office for a follow-up appointment approximately 3-5 days after your surgery.  Your doctors nurse will typically make your follow-up appointment when she calls you with your pathology report.  Expect your pathology report 3-4business days after surgery. 10. OTHER INSTRUCTIONS: ______________________________________________________________________________________________ ____________________________________________________________________________________________ WHEN TO CALL YOUR DR Hadli Vandemark: 1. Fever over 101.0 2. Nausea and/or vomiting 3. Extreme swelling or bruising 4. Continued bleeding from  incision. 5. Increased pain, redness, or drainage from the incision. The clinic staff is available to answer your questions during regular business hours.  Please dont hesitate to call and ask to speak to one of the nurses for clinical concerns.  If you have a medical emergency, go to the nearest emergency room or call 911.  A surgeon from [facility] is always on call at the hospital. [address], [place], [place], [place]  [address] ? [address], [place], [place]   [address] [phone] ? [phone] ? FAX [phone] Web site: [email or web address]

## 2016-05-19 NOTE — Anesthesia Procedure Notes (Addendum)
Anesthesia Regional Block:  Pectoralis block  Pre-Anesthetic Checklist: ,, timeout performed, Correct Patient, Correct Site, Correct Laterality, Correct Procedure, Correct Position, site marked, Risks and benefits discussed, Surgical consent,  Pre-op evaluation,  Post-op pain management  Laterality: Right and Upper  Prep: chloraprep       Needles:   Needle Type: Echogenic Stimulator Needle     Needle Length: 9cm 9 cm Needle Gauge: 21 and 21 G  Needle insertion depth: 5 cm   Additional Needles:  Procedures: ultrasound guided (picture in chart) Pectoralis block Narrative:  Start time: 05/19/2016 9:00 AM End time: 05/19/2016 9:17 AM Injection made incrementally with aspirations every 5 mL.  Performed by: Personally  Anesthesiologist: Margie Brink  Additional Notes: Tolerated well

## 2016-05-19 NOTE — Anesthesia Postprocedure Evaluation (Addendum)
Anesthesia Post Note  Patient: AZALEIA ARRELLANO  Procedure(s) Performed: Procedure(s) (LRB): RIGHT NIPPLE SPARING MASTECTOMY WITH SENTINAL LYMPH NODE BIOPSY WITH BLUE DYE INJECTION (Right) RIGHT BREAST RECONSTRUCTION WITH PLACEMENT OF TISSUE EXPANDER AND  ALLODERM. (Right)  Patient location during evaluation: PACU Anesthesia Type: Regional and General Level of consciousness: sedated and patient cooperative Pain management: pain level controlled Vital Signs Assessment: post-procedure vital signs reviewed and stable Respiratory status: spontaneous breathing Cardiovascular status: stable Anesthetic complications: no       Last Vitals:  Vitals:   05/19/16 1245 05/19/16 1300  BP: 108/76 103/74  Pulse: 95 86  Resp: 17 16  Temp:  36.1 C    Last Pain:  Vitals:   05/19/16 1315  TempSrc:   PainSc: [place]

## 2016-05-19 NOTE — Interval H&P Note (Signed)
History and Physical Interval Note:  05/19/2016 7:26 AM  Patricia Chandler  has presented today for surgery, with the diagnosis of RIGHT BREAST CANCER  The various methods of treatment have been discussed with the patient and family. After consideration of risks, benefits and other options for treatment, the patient has consented to  Procedure(s): RIGHT NIPPLE SPARING MASTECTOMY WITH SENTINAL LYMPH NODE BIOPSY INJECT BLUE DYE (Right) RIGHT BREAST RECONSTRUCTION WITH PLACEMENT OF TISSUE EXPANDER AND POSSIBLE ALLODERM. (Right) as a surgical intervention .  The patient's history has been reviewed, patient examined, no change in status, stable for surgery.  I have reviewed the patient's chart and labs.  Questions were answered to the patient's satisfaction.     Patricia Chandler

## 2016-05-19 NOTE — Anesthesia Preprocedure Evaluation (Addendum)
Anesthesia Evaluation  Patient identified by MRN, date of birth, ID band Patient awake    Reviewed: Allergy & Precautions, NPO status , Patient's Chart, lab work & pertinent test results  History of Anesthesia Complications (+) history of anesthetic complications  Airway Mallampati: II  TM Distance: <3 FB   Mouth opening: Limited Mouth Opening  Dental  (+) Teeth Intact   Pulmonary neg pulmonary ROS,    breath sounds clear to auscultation       Cardiovascular  Rhythm:Regular Rate:Normal     Neuro/Psych negative neurological ROS     GI/Hepatic GERD  ,  Endo/Other    Renal/GU      Musculoskeletal   Abdominal   Peds  Hematology  (+) anemia ,   Anesthesia Other Findings   Reproductive/Obstetrics                             Anesthesia Physical Anesthesia Plan  ASA: II  Anesthesia Plan: General and Regional   Post-op Pain Management: GA combined w/ Regional for post-op pain   Induction: Intravenous  Airway Management Planned: Oral ETT  Additional Equipment:   Intra-op Plan:   Post-operative Plan: Extubation in OR  Informed Consent: I have reviewed the patients History and Physical, chart, labs and discussed the procedure including the risks, benefits and alternatives for the proposed anesthesia with the patient or authorized representative who has indicated his/her understanding and acceptance.     Plan Discussed with:   Anesthesia Plan Comments:        Anesthesia Quick Evaluation

## 2016-05-19 NOTE — Op Note (Signed)
Operative Note   DATE OF OPERATION: 1.30.2018   LOCATION: [facility]-observation  SURGICAL DIVISION: Plastic Surgery  PREOPERATIVE DIAGNOSES:  1. Right breast cancer 2. Neoadjuvant chemotherapy 3. History augmentation mammaplasty  POSTOPERATIVE DIAGNOSES:  same  PROCEDURE:  1. Right breast reconstruction with tissue expander 2. Acellular dermis (Alloderm) for breast reconstruction 100 cm2  SURGEON: Irene Limbo MD MBA  ASSISTANT: none  ANESTHESIA:  General.   EBL: 100 ml for entire case  COMPLICATIONS: None immediate.   INDICATIONS FOR PROCEDURE:  The patient, Patricia Chandler, is a 61 y.o. female born on June 14, 1955, is here for right nipple sparing mastectomy. She has a history of prior dual plane augmentation and plan to remove current implant and place tissue expander.   FINDINGS: Removed intact smooth saline round implant with label "Mentor 300  [id number]." Approximately 330 ml drained from this implant. Natrelle 133MX-13-[id number] 500 cc tissue expander placed, initial fill volume 240 ml. SN [id number]  DESCRIPTION OF PROCEDURE:  The patient's operative site was marked with the patient in the preoperative area. The patient was taken to the operating room. SCDs were placed and IV antibiotics were given. The patient's operative site was prepped and draped in a sterile fashion. A time out was performed and all information was confirmed to be correct. Following completion of right mastectomy the cavity was irrigated with solution containing Ancef, gentamicin, and bacitracin. Hemostasis ensured. The inframammary fold was stabilized with interrupted 3-0 PDS to suture superficial fascia to posterior capsule. Acellular dermal matrix perforated and sewn to inferior border of pectoralis muscle. 19 Fr drain placed and secured with 2-0 nylon. Cavity irrigated with Betadine. The expander was prepared and secured to chest wall with 3-0 vicryl. The ADM was trimmed and sutured to chest wall and  serratus fascia with 3-0 vicryl. 0- vicryl suture was used to advance the posterior axillary line soft tissue anteriorly and secured to serratus and pectoralis muscle. The incision was closed with 3-0 vicryl in fascial layer and 4-0 vicryl in dermis. Skin closure completed with 4-0 monocryl subcuticular and tissue adhesive.  The port was accessed and filled to 200 ml. The patient was brought to upright position and the skin flaps were redraped so that NAC was symmetric from the midline. Transparent, adherent dressings applied.Dry dressing and breast binder applied.   The patient was allowed to wake from anesthesia, extubated and taken to the recovery room in satisfactory condition.   SPECIMENS: none  DRAINS: 19 Fr JP in right breast reconstruction  Irene Limbo, MD [facility] [phone], pin [phone]

## 2016-05-19 NOTE — Transfer of Care (Signed)
Immediate Anesthesia Transfer of Care Note  Patient: Patricia Chandler  Procedure(s) Performed: Procedure(s): RIGHT NIPPLE SPARING MASTECTOMY WITH SENTINAL LYMPH NODE BIOPSY INJECT BLUE DYE (Right) RIGHT BREAST RECONSTRUCTION WITH PLACEMENT OF TISSUE EXPANDER AND  ALLODERM. (Right)  Patient Location: PACU  Anesthesia Type:General  Level of Consciousness: awake and alert   Airway & Oxygen Therapy: Patient Spontanous Breathing  Post-op Assessment: Report given to RN and Post -op Vital signs reviewed and stable  Post vital signs: Reviewed and stable  Last Vitals:  Vitals:   05/19/16 0921 05/19/16 0922  BP:    Pulse: 98 96  Resp: 14 18  Temp:      Last Pain:  Vitals:   05/19/16 0730  TempSrc: Oral         Complications: No apparent anesthesia complications

## 2016-05-19 NOTE — H&P (Signed)
Patricia Chandler is an 61 y.o. female.   Chief Complaint: right breast cancer s/p primary chemo HPI:  54 yof who was initially seen earlier this year for a newly diagnosed right breast cancer. she has negative genetic testing. an mri that showed three separate masses measuring 1.7x1.5x1.4 cm, 1x0.7x0.6 cm and 1.4x1.3x1.3 cm the masses span an area of 3.7x1.4x1.4 cm. the left breast and the nodes are negative. both lesions were er/pr positive and one was her2 negative and one was her2 positive. Ki was over 80%. she has implants. she has undergone primary chemotherapy that has been difficult. she was admitted after the first cycle. she has completed chemotherapy. she has follow up mri that shows near complete response. she returns today to discuss options   Past Medical History:  Diagnosis Date  . Anxiety   . Breast cancer ([place])   . Complication of anesthesia 12/2015   gastric content to come in LMA, needed intubation, had severe laryngitis  . Diverticulosis   . Family history of breast cancer   . Genital herpes   . GERD (gastroesophageal reflux disease)   . Heart murmur    had ECHO 12 yrs ago was neg  . Internal hemorrhoid     Past Surgical History:  Procedure Laterality Date  . BREAST ENHANCEMENT SURGERY    . BREAST LUMPECTOMY     right/BENIGN  . PORTACATH PLACEMENT N/A 12/30/2015   Procedure: INSERTION PORT-A-CATH WITH [place];  Surgeon: Rolm Bookbinder, MD;  Location: [facility];  Service: General;  Laterality: N/A;    Family History  Problem Relation Age of Onset  . Breast cancer Maternal Aunt     dx in her 34s  . Stroke Father   . Hypertension Father   . Pulmonary fibrosis Mother   . Breast cancer Paternal Aunt 78  . Cancer Paternal Grandmother     abdominal; cervical vs uterine dx in her 36s  . Parkinson's disease Maternal Grandfather    Social History:  reports that she has never smoked. She has never used smokeless tobacco. She reports that she  drinks about 1.8 oz of alcohol per week . She reports that she does not use drugs.  Allergies: No Known Allergies  Medications Prior to Admission  Medication Sig Dispense Refill  . ALPRAZolam (XANAX) 0.5 MG tablet TAKE 1 TABLET BY MOUTH AT BEDTIME AS NEEDED FOR ANXIETY 60 tablet 0  . Ascorbic Acid (VITAMIN C PO) Take 1 tablet by mouth daily.     . calcium carbonate (OS-CAL) 600 MG TABS tablet Take 600 mg by mouth daily.    . Cholecalciferol (VITAMIN D3) 1000 units CAPS Take 4,000 Units by mouth daily.    Marland Kitchen loperamide (IMODIUM) 2 MG capsule Take 2 mg by mouth as needed for diarrhea or loose stools.    . magnesium 30 MG tablet Take 30 mg by mouth daily.     . Multiple Vitamin (MULTIVITAMIN WITH MINERALS) TABS tablet Take 1 tablet by mouth daily.    . pantoprazole (PROTONIX) 40 MG tablet Take 40 mg by mouth 2 (two) times daily.   0  . Probiotic Product (PROBIOTIC DAILY PO) Take 1 capsule by mouth daily.     Marland Kitchen zolpidem (AMBIEN) 5 MG tablet Take 1 tablet (5 mg total) by mouth at bedtime as needed for sleep. 30 tablet 1  . ibuprofen (ADVIL,MOTRIN) 200 MG tablet Take 400-600 mg by mouth every 6 (six) hours as needed for moderate pain.  Results for orders placed or performed during the hospital encounter of 05/19/16 (from the past 48 hour(s))  CBC     Status: Abnormal   Collection Time: 05/19/16  8:10 AM  Result Value Ref Range   WBC 3.2 (L) 4.0 - 10.5 K/uL   RBC 2.91 (L) 3.87 - 5.11 MIL/uL   Hemoglobin 9.4 (L) 12.0 - 15.0 g/dL   HCT 28.5 (L) 36.0 - 46.0 %   MCV 97.9 78.0 - 100.0 fL   MCH 32.3 26.0 - 34.0 pg   MCHC 33.0 30.0 - 36.0 g/dL   RDW 15.3 11.5 - 15.5 %   Platelets 178 150 - 400 K/uL   No results found.  ROS General Not Present- Appetite Loss, Chills, Fatigue, Fever, Night Sweats, Weight Gain and Weight Loss. Skin Present- Change in Wart/Mole. Not Present- Dryness, Hives, Jaundice, New Lesions, Non-Healing Wounds, Rash and Ulcer. HEENT Present- Wears glasses/contact lenses.  Not Present- Earache, Hearing Loss, Hoarseness, Nose Bleed, Oral Ulcers, Ringing in the Ears, Seasonal Allergies, Sinus Pain, Sore Throat, Visual Disturbances and Yellow Eyes. Respiratory Present- Snoring. Not Present- Bloody sputum, Chronic Cough, Difficulty Breathing and Wheezing. Breast Present- Breast Mass. Not Present- Breast Pain, Nipple Discharge and Skin Changes. Cardiovascular Not Present- Chest Pain, Difficulty Breathing Lying Down, Leg Cramps, Palpitations, Rapid Heart Rate, Shortness of Breath and Swelling of Extremities. Gastrointestinal Not Present- Abdominal Pain, Bloating, Bloody Stool, Change in Bowel Habits, Chronic diarrhea, Constipation, Difficulty Swallowing, Excessive gas, Gets full quickly at meals, Hemorrhoids, Indigestion, Nausea, Rectal Pain and Vomiting. Female Genitourinary Not Present- Frequency, Nocturia, Painful Urination, Pelvic Pain and Urgency. Musculoskeletal Not Present- Back Pain, Joint Pain, Joint Stiffness, Muscle Pain, Muscle Weakness and Swelling of Extremities. Neurological Not Present- Decreased Memory, Fainting, Headaches, Numbness, Seizures, Tingling, Tremor, Trouble walking and Weakness. Psychiatric Not Present- Anxiety, Bipolar, Change in Sleep Pattern, Depression, Fearful and Frequent crying. Endocrine Not Present- Cold Intolerance, Excessive Hunger, Hair Changes, Heat Intolerance, Hot flashes and New Diabetes. Hematology Not Present- Blood Thinners, Easy Bruising, Excessive bleeding, Gland problems, HIV and Persistent Infections. Blood pressure 114/84, pulse 93, temperature 98.1 F (36.7 C), temperature source Oral, resp. rate 19, height _0  (1.6 m), weight 58.5 kg (129 lb), SpO2 100 %. Physical Exam  Vitals Nance Pear CMA; 04/22/2016 1:56 PM) 04/22/2016 1:56 PM Physical Exam Rolm Bookbinder MD; 04/22/2016 8:43 PM) General Mental Status-Alert. Orientation-Oriented X3. Chest and Lung Exam Chest and lung exam reveals -on auscultation,  normal breath sounds, no adventitious sounds and normal vocal resonance. Breast Nipples-No Discharge. Breast Lump-No Palpable Breast Mass. Note: implants in place Cardiovascular Cardiovascular examination reveals -normal heart sounds, regular rate and rhythm with no murmurs. Lymphatic Head & Neck General Head & Neck Lymphatics: Bilateral - Description - Normal. Axillary General Axillary Region: Bilateral - Description - Normal. Note: no [place] adenopathy  Assessment/Plan BREAST CANCER OF UPPER-INNER QUADRANT OF RIGHT FEMALE BREAST (C50.211) Story: Right nipple sparing mastectomy, right axillary sentinel node biopsy We reviewed options again and I answered all questions. She does not want to proceed with radiotherapy if at all possible. I discussed right nsm with risks including but not limited to bleeding, infection, skin or nac loss (either complete or partial), loss of sensation, cancer in nipple specimen necessitating removal of nipple later. She needs to see Dr Iran Planas again and will plan on scheduling 3-4 weeks after completion of chemotherapy.  Rolm Bookbinder, MD 05/19/2016, 9:07 AM

## 2016-05-19 NOTE — Anesthesia Procedure Notes (Signed)
Procedure Name: Intubation Date/Time: 05/19/2016 9:35 AM Performed by: Lieutenant Diego Pre-anesthesia Checklist: Patient identified, Emergency Drugs available, Suction available and Patient being monitored Patient Re-evaluated:Patient Re-evaluated prior to inductionOxygen Delivery Method: Circle system utilized Preoxygenation: Pre-oxygenation with 100% oxygen Intubation Type: IV induction Ventilation: Mask ventilation without difficulty Laryngoscope Size: Glidescope Grade View: Grade I Tube type: Oral Tube size: 7.0 mm Number of attempts: 1 Airway Equipment and Method: Oral airway,  Video-laryngoscopy and Rigid stylet Placement Confirmation: ETT inserted through vocal cords under direct vision,  positive ETCO2 and breath sounds checked- equal and bilateral Secured at: 22 cm Tube secured with: Tape Dental Injury: Teeth and Oropharynx as per pre-operative assessment

## 2016-05-19 NOTE — Interval H&P Note (Signed)
History and Physical Interval Note:  05/19/2016 9:10 AM  Patricia Chandler  has presented today for surgery, with the diagnosis of RIGHT BREAST CANCER  The various methods of treatment have been discussed with the patient and family. After consideration of risks, benefits and other options for treatment, the patient has consented to  Procedure(s): RIGHT NIPPLE SPARING MASTECTOMY WITH SENTINAL LYMPH NODE BIOPSY INJECT BLUE DYE (Right) RIGHT BREAST RECONSTRUCTION WITH PLACEMENT OF TISSUE EXPANDER AND POSSIBLE ALLODERM. (Right) as a surgical intervention .  The patient's history has been reviewed, patient examined, no change in status, stable for surgery.  I have reviewed the patient's chart and labs.  Questions were answered to the patient's satisfaction.     Lyndia Bury

## 2016-05-19 NOTE — Progress Notes (Signed)
Emotional support during breast injections °

## 2016-05-19 NOTE — Op Note (Signed)
Preoperative diagnosis:right breast cancer s/p primary chemotherapy Postoperative diagnosis: same as above Procedure: 1.Rightnipple sparing mastectomy 2. Right axillary sentinel node biopsy 3. Injection blue dye Surgeon: Dr Serita Grammes Asst: Dr Irene Limbo Anesthesia: general with pectoral block EBL: 75 cc Drains per plastic surgery Specimens: right breast marked short superior long lateral and double mark nipple areolar margin, right nipple, right axillary sentinel nodes with highest count of 154 Complications none Sponge and needle count correct Dispo case turned over to plastics  Indications: This is a 1 yof who has right breast cancer that is her2 positive. She has undergone primary chemotherapy with near complete resolution of the cancer radiologically. We discussed options and she has elected to undergo nsm with sentinel node biopsy. She will undergo implant removal and expander reconstruction.   Procedure: After informed consent was obtained the patient was taken to the OR. She was given antibiotics and scds were in place. She had a pectoralblock.she was injected with technetium in standard sterile surgical fashion.  She was placed under general anesthesia without complication. She was prepped and draped in the standard sterile surgical fashion. A timeout was performed.  I also injected blue dye/saline in periareolar fashion and massaged it for a minute I made an 10cm inframammary incision about 10cm from sternum on the rightside initially. I first incised the implant capsule and removed the implant. I then created a posterior flap removing the fascia from the pectoralis muscle. This was done to the parasternal region, clavicle and latissimus laterally. I then created the anterior flap. The breast was then removed in its entirety. The skin and nac were all viable. The breast was marked as above. I removed the nipple margin as a separate specimen. Hemostasis  was obtained.  I then entered into the axillary fascia. I removed what might just be a single node.  Hemostasis was obtained. There was no background radioactivity. I then closed this with 2-0 vicryl, 3-0 vicryl and 4-0 monocryl.   Case was turned over to plastic surgery for completion.

## 2016-05-20 ENCOUNTER — Encounter (HOSPITAL_BASED_OUTPATIENT_CLINIC_OR_DEPARTMENT_OTHER): Payer: Self-pay | Admitting: General Surgery

## 2016-05-20 DIAGNOSIS — C50211 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 female breast: Secondary | ICD-10-CM | POA: Diagnosis not present

## 2016-05-20 MED ORDER — MORPHINE SULFATE (PF) 4 MG/ML IV SOLN
INTRAVENOUS | Status: AC
  Filled 2016-05-20: qty 1

## 2016-05-25 ENCOUNTER — Other Ambulatory Visit: Payer: Self-pay | Admitting: Emergency Medicine

## 2016-05-25 DIAGNOSIS — C50211 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 female breast: Secondary | ICD-10-CM

## 2016-05-25 DIAGNOSIS — Z170 Estrogen receptor positive status [ER+]: Secondary | ICD-10-CM

## 2016-05-26 ENCOUNTER — Encounter: Payer: Self-pay | Admitting: *Deleted

## 2016-05-26 ENCOUNTER — Ambulatory Visit (HOSPITAL_BASED_OUTPATIENT_CLINIC_OR_DEPARTMENT_OTHER): Payer: BLUE CROSS/BLUE SHIELD | Admitting: Hematology and Oncology

## 2016-05-26 ENCOUNTER — Ambulatory Visit (HOSPITAL_BASED_OUTPATIENT_CLINIC_OR_DEPARTMENT_OTHER): Payer: BLUE CROSS/BLUE SHIELD

## 2016-05-26 ENCOUNTER — Ambulatory Visit: Payer: BLUE CROSS/BLUE SHIELD

## 2016-05-26 ENCOUNTER — Other Ambulatory Visit (HOSPITAL_BASED_OUTPATIENT_CLINIC_OR_DEPARTMENT_OTHER): Payer: BLUE CROSS/BLUE SHIELD

## 2016-05-26 DIAGNOSIS — Z5112 Encounter for antineoplastic immunotherapy: Secondary | ICD-10-CM | POA: Diagnosis not present

## 2016-05-26 DIAGNOSIS — Z170 Estrogen receptor positive status [ER+]: Secondary | ICD-10-CM

## 2016-05-26 DIAGNOSIS — C50211 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 female breast: Secondary | ICD-10-CM

## 2016-05-26 DIAGNOSIS — Z95828 Presence of other vascular implants and grafts: Secondary | ICD-10-CM

## 2016-05-26 LAB — CBC WITH DIFFERENTIAL/PLATELET
BASO%: 0.2 % (ref 0.0–2.0)
BASOS ABS: 0 10*3/uL (ref 0.0–0.1)
EOS%: 1.5 % (ref 0.0–7.0)
Eosinophils Absolute: 0.1 10*3/uL (ref 0.0–0.5)
HCT: 26.5 % — ABNORMAL LOW (ref 34.8–46.6)
HGB: 8.5 g/dL — ABNORMAL LOW (ref 11.6–15.9)
LYMPH#: 1.3 10*3/uL (ref 0.9–3.3)
LYMPH%: 24.2 % (ref 14.0–49.7)
MCH: 32.4 pg (ref 25.1–34.0)
MCHC: 32.1 g/dL (ref 31.5–36.0)
MCV: 101.1 fL — ABNORMAL HIGH (ref 79.5–101.0)
MONO#: 0.5 10*3/uL (ref 0.1–0.9)
MONO%: 8.7 % (ref 0.0–14.0)
NEUT#: 3.6 10*3/uL (ref 1.5–6.5)
NEUT%: 65.4 % (ref 38.4–76.8)
PLATELETS: 215 10*3/uL (ref 145–400)
RBC: 2.62 10*6/uL — ABNORMAL LOW (ref 3.70–5.45)
RDW: 15.1 % — ABNORMAL HIGH (ref 11.2–14.5)
WBC: 5.5 10*3/uL (ref 3.9–10.3)

## 2016-05-26 LAB — COMPREHENSIVE METABOLIC PANEL
ALT: 10 U/L (ref 0–55)
ANION GAP: 9 meq/L (ref 3–11)
AST: 17 U/L (ref 5–34)
Albumin: 3.6 g/dL (ref 3.5–5.0)
Alkaline Phosphatase: 56 U/L (ref 40–150)
BUN: 21 mg/dL (ref 7.0–26.0)
CALCIUM: 9.4 mg/dL (ref 8.4–10.4)
CO2: 24 mEq/L (ref 22–29)
Chloride: 108 mEq/L (ref 98–109)
Creatinine: 0.9 mg/dL (ref 0.6–1.1)
EGFR: 71 mL/min/{1.73_m2} — ABNORMAL LOW (ref >90)
Glucose: 90 mg/dl (ref 70–140)
POTASSIUM: 3.9 meq/L (ref 3.5–5.1)
Sodium: 141 mEq/L (ref 136–145)
Total Bilirubin: 0.31 mg/dL (ref 0.20–1.20)
Total Protein: 5.8 g/dL — ABNORMAL LOW (ref 6.4–8.3)

## 2016-05-26 MED ORDER — ACETAMINOPHEN 325 MG PO TABS
650.0000 mg | ORAL_TABLET | Freq: Once | ORAL | Status: AC
  Administered 2016-05-26: 650 mg via ORAL

## 2016-05-26 MED ORDER — SODIUM CHLORIDE 0.9 % IV SOLN
Freq: Once | INTRAVENOUS | Status: AC
  Administered 2016-05-26: 12:00:00 via INTRAVENOUS

## 2016-05-26 MED ORDER — DIPHENHYDRAMINE HCL 25 MG PO CAPS
ORAL_CAPSULE | ORAL | Status: AC
  Filled 2016-05-26: qty 1

## 2016-05-26 MED ORDER — SODIUM CHLORIDE 0.9 % IJ SOLN
10.0000 mL | INTRAMUSCULAR | Status: DC | PRN
  Administered 2016-05-26: 10 mL via INTRAVENOUS
  Filled 2016-05-26: qty 10

## 2016-05-26 MED ORDER — HEPARIN SOD (PORK) LOCK FLUSH 100 UNIT/ML IV SOLN
500.0000 [IU] | Freq: Once | INTRAVENOUS | Status: AC | PRN
  Administered 2016-05-26: 500 [IU]
  Filled 2016-05-26: qty 5

## 2016-05-26 MED ORDER — ACETAMINOPHEN 325 MG PO TABS
ORAL_TABLET | ORAL | Status: AC
  Filled 2016-05-26: qty 2

## 2016-05-26 MED ORDER — SODIUM CHLORIDE 0.9 % IV SOLN
420.0000 mg | Freq: Once | INTRAVENOUS | Status: AC
  Administered 2016-05-26: 420 mg via INTRAVENOUS
  Filled 2016-05-26: qty 14

## 2016-05-26 MED ORDER — SODIUM CHLORIDE 0.9% FLUSH
10.0000 mL | INTRAVENOUS | Status: DC | PRN
  Administered 2016-05-26: 10 mL
  Filled 2016-05-26: qty 10

## 2016-05-26 MED ORDER — TRASTUZUMAB CHEMO 150 MG IV SOLR
6.0000 mg/kg | Freq: Once | INTRAVENOUS | Status: AC
  Administered 2016-05-26: 378 mg via INTRAVENOUS
  Filled 2016-05-26: qty 18

## 2016-05-26 MED ORDER — DIPHENHYDRAMINE HCL 25 MG PO CAPS
25.0000 mg | ORAL_CAPSULE | Freq: Once | ORAL | Status: AC
  Administered 2016-05-26: 25 mg via ORAL

## 2016-05-26 MED ORDER — LETROZOLE 2.5 MG PO TABS: 2.5000 mg | ORAL_TABLET | Freq: Every day | ORAL | 3 refills | Status: DC

## 2016-05-26 MED FILL — PANTOPRAZOLE SOD DR 40 MG T: 40 | 30 days supply | Qty: 60 | Fill #0

## 2016-05-26 MED FILL — LETROZOLE 2.5 MG TABLET: 2.5 | 90 days supply | Qty: 90 | Fill #0

## 2016-05-26 NOTE — Patient Instructions (Signed)
Cancer Center Discharge Instructions for Patients Receiving Chemotherapy  Today you received the following chemotherapy agents herceptin/perjeta  To help prevent nausea and vomiting after your treatment, we encourage you to take your nausea medication as directed   If you develop nausea and vomiting that is not controlled by your nausea medication, call the clinic.   BELOW ARE SYMPTOMS THAT SHOULD BE REPORTED IMMEDIATELY:  *FEVER GREATER THAN 100.5 F  *CHILLS WITH OR WITHOUT FEVER  NAUSEA AND VOMITING THAT IS NOT CONTROLLED WITH YOUR NAUSEA MEDICATION  *UNUSUAL SHORTNESS OF BREATH  *UNUSUAL BRUISING OR BLEEDING  TENDERNESS IN MOUTH AND THROAT WITH OR WITHOUT PRESENCE OF ULCERS  *URINARY PROBLEMS  *BOWEL PROBLEMS  UNUSUAL RASH Items with * indicate a potential emergency and should be followed up as soon as possible.  Feel free to call the clinic you have any questions or concerns. The clinic phone number is [phone].  

## 2016-05-26 NOTE — Assessment & Plan Note (Signed)
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 Genetic counseling: Normal did not reveal any abnormal mutations 2. Neoadjuvant chemotherapy. [facility] Perjeta 6 cycles followed by Herceptin maintenance for one year) from 12/31/15 to 04/15/16 3. Right mastectomy with reconstruction: 05/19/2016:Right mastectomy: IDC grade 3, 1.3 cm, low-grade DCIS, LVIDS present, margins negative, 0/1 lymph node negative, [id number] stage IA; repeat HER-2 negative ratio 1.41 4. Adjuvant antiestrogen therapy -------------------------------------------------------------------------------------------------------------------------------------------------- Pathology counseling: I discussed the final pathology report of the patient provided  a copy of this report. I discussed the margins as well as lymph node surgeries. We also discussed the final staging along with previously performed ER/PR and HER-2/neu testing.  Plan: 1. Continue maintenance Herceptin every 3 weeks 2. antiestrogen therapy 3. Role of Neratinib discussed

## 2016-05-26 NOTE — Progress Notes (Signed)
Patient Care Team: Kelton Pillar, MD as PCP - General (Family Medicine)  DIAGNOSIS:  Encounter Diagnosis  Name Primary?  . 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 breast in female, estrogen receptor positive ([place])     SUMMARY OF ONCOLOGIC HISTORY:   Breast cancer of upper-inner quadrant of right female breast ([place])   12/05/2015 Mammogram    Right breast mass 2:00 4 cm from nipple: 1.7 cm, T1c N0 stage IA; right breast 2:00 6 cm from nipple 1.3 cm mass, 7 mm satellite nodule between the 2 masses, T1 cN0 stage IA      12/06/2015 Initial Diagnosis    Right breast biopsy 2:00 6 cm from nipple: Grade 2-3 invasive ductal cancer, ER 90%, PR 2%, Ki-67 80%, HER-2 positive ratio 2.07; right biopsy 2:00 4 cm from nipple: IDC with DCIS, ER 95%, PR 10%, Ki-67 90%, HER-2 negative ratio 1.27      12/18/2015 Breast MRI    3 enhancing masses in the upper inner quadrant right breast spanning an area 3.7 cm (1.7 cm, 1 cm, 1.4 cm) no lymph node enlargement      12/31/2015 - 04/15/2016 Neo-Adjuvant Chemotherapy    [facility] Perjeta 6 cycles followed by Herceptin, Perjeta maintenance for 1 year      01/08/2016 - 01/12/2016 Hospital Admission    Neutropenic fever hospitalization (because patient did not receive Neulasta with cycle 1)      01/10/2016 Miscellaneous    Genetic testing was normal did not reveal any mutations      04/16/2016 Breast MRI    Near CR to therapy. Previous 2 biopsied massses are no longer seen. Third satellite lesion is smaller from 10 mm to 5.3 mm, no abnormal LN.      05/19/2016 Surgery    Right mastectomy: IDC grade 3, 1.3 cm, low-grade DCIS, LVIDS present, margins negative, 0/1 lymph node negative, ypT1CypN0 stage IA; repeat HER-2 negative ratio 1.41      05/19/2016 Surgery    Right breast reconstruction with tissue expander. Acellular dermis for breast reconstruction (Dr.Thimappa)        CHIEF COMPLIANT: Follow-up after recent mastectomy and reconstruction  surgeries  INTERVAL HISTORY: Patricia Chandler is a 61 year old with above-mentioned history of right breast cancer treated with neoadjuvant chemotherapy followed by mastectomy and reconstruction. She is here to discuss the pathology report. She is still very sore from recent surgery. She is anxious to discuss the pathology report today.  REVIEW OF SYSTEMS:   Constitutional: Denies fevers, chills or abnormal weight loss Eyes: Denies blurriness of vision Ears, nose, mouth, throat, and face: Denies mucositis or sore throat Respiratory: Denies cough, dyspnea or wheezes Cardiovascular: Denies palpitation, chest discomfort Gastrointestinal:  Denies nausea, heartburn or change in bowel habits Skin: Denies abnormal skin rashes Lymphatics: Denies new lymphadenopathy or easy bruising Neurological:Denies numbness, tingling or new weaknesses Behavioral/Psych: Mood is stable, no new changes  Extremities: No lower extremity edema Breast: Recent right mastectomy with reconstruction. All other systems were reviewed with the patient and are negative.  I have reviewed the past medical history, past surgical history, social history and family history with the patient and they are unchanged from previous note.  ALLERGIES:  has No Known Allergies.  MEDICATIONS:  Current Outpatient Prescriptions  Medication Sig Dispense Refill  . ALPRAZolam (XANAX) 0.5 MG tablet TAKE 1 TABLET BY MOUTH AT BEDTIME AS NEEDED FOR ANXIETY 60 tablet 0  . Ascorbic Acid (VITAMIN C PO) Take 1 tablet by mouth daily.     Marland Kitchen  calcium carbonate (OS-CAL) 600 MG TABS tablet Take 600 mg by mouth daily.    . Cholecalciferol (VITAMIN D3) 1000 units CAPS Take 4,000 Units by mouth daily.    Marland Kitchen ibuprofen (ADVIL,MOTRIN) 200 MG tablet Take 400-600 mg by mouth every 6 (six) hours as needed for moderate pain.    Marland Kitchen letrozole (FEMARA) 2.5 MG tablet Take 1 tablet (2.5 mg total) by mouth daily. 90 tablet 3  . loperamide (IMODIUM) 2 MG capsule Take 2  mg by mouth as needed for diarrhea or loose stools.    . magnesium 30 MG tablet Take 30 mg by mouth daily.     . Multiple Vitamin (MULTIVITAMIN WITH MINERALS) TABS tablet Take 1 tablet by mouth daily.    . pantoprazole (PROTONIX) 40 MG tablet Take 40 mg by mouth 2 (two) times daily.   0  . Probiotic Product (PROBIOTIC DAILY PO) Take 1 capsule by mouth daily.     Marland Kitchen zolpidem (AMBIEN) 5 MG tablet Take 1 tablet (5 mg total) by mouth at bedtime as needed for sleep. 30 tablet 1   No current facility-administered medications for this visit.    Facility-Administered Medications Ordered in Other Visits  Medication Dose Route Frequency Provider Last Rate Last Dose  . heparin lock flush 100 unit/mL  500 Units Intracatheter Once PRN Nicholas Lose, MD      . pertuzumab (PERJETA) 420 mg in sodium chloride 0.9 % 250 mL chemo infusion  420 mg Intravenous Once Nicholas Lose, MD 528 mL/hr at 05/26/16 1317 420 mg at 05/26/16 1317  . sodium chloride flush (NS) 0.9 % injection 10 mL  10 mL Intracatheter PRN Nicholas Lose, MD        PHYSICAL EXAMINATION: ECOG PERFORMANCE STATUS: 1 - Symptomatic but completely ambulatory  Vitals:   05/26/16 1046  BP: 116/79  Pulse: 81  Resp: (!) 8  Temp: 97.6 F (36.4 C)   Filed Weights   05/26/16 1046  Weight: 128 lb 11.2 oz (58.4 kg)    GENERAL:alert, no distress and comfortable SKIN: skin color, texture, turgor are normal, no rashes or significant lesions EYES: normal, Conjunctiva are pink and non-injected, sclera clear OROPHARYNX:no exudate, no erythema and lips, buccal mucosa, and tongue normal  NECK: supple, thyroid normal size, non-tender, without nodularity LYMPH:  no palpable lymphadenopathy in the cervical, axillary or inguinal LUNGS: clear to auscultation and percussion with normal breathing effort HEART: regular rate & rhythm and no murmurs and no lower extremity edema ABDOMEN:abdomen soft, non-tender and normal bowel sounds MUSCULOSKELETAL:no cyanosis of  digits and no clubbing  NEURO: alert & oriented x 3 with fluent speech, no focal motor/sensory deficits EXTREMITIES: No lower extremity edema  LABORATORY DATA:  I have reviewed the data as listed   Chemistry      Component Value Date/Time   NA 141 05/26/2016 1019   K 3.9 05/26/2016 1019   CL 111 01/12/2016 0520   CO2 24 05/26/2016 1019   BUN 21.0 05/26/2016 1019   CREATININE 0.9 05/26/2016 1019      Component Value Date/Time   CALCIUM 9.4 05/26/2016 1019   ALKPHOS 56 05/26/2016 1019   AST 17 05/26/2016 1019   ALT 10 05/26/2016 1019   BILITOT 0.31 05/26/2016 1019       Lab Results  Component Value Date   WBC 5.5 05/26/2016   HGB 8.5 (L) 05/26/2016   HCT 26.5 (L) 05/26/2016   MCV 101.1 (H) 05/26/2016   PLT 215 05/26/2016   NEUTROABS 3.6 05/26/2016  ASSESSMENT & PLAN:  Breast cancer of upper-inner quadrant of right female breast ([place]) Right breast biopsy 2:00 6 cm from nipple: Grade 2-3 invasive ductal cancer, ER 90%, PR 2%, Ki-67 80%, HER-2 positive ratio 2.07; right biopsy 2:00 4 cm from nipple: IDC with DCIS, ER 95%, PR 10%, Ki-67 90%, HER-2 negative ratio 1.27 Breast MRI08/30/2017: 3 enhancing masses in the upper inner quadrant right breast spanning an area 3.7 cm (1.7 cm, 1 cm, 1.4 cm) no lymph node enlargement Clinical stage: T2N0 (stage 2A)  Treatment plan: 1. Genetic counseling: Normal did not reveal any abnormal mutations 2. Neoadjuvant chemotherapy. [facility] Perjeta 6 cycles followed by Herceptin And Perjeta maintenance for one year) from 12/31/15 to 04/15/16 3. Right mastectomy with reconstruction: 05/19/2016:Right mastectomy: IDC grade 3, 1.3 cm, low-grade DCIS, LVIDS present, margins negative, 0/1 lymph node negative, ypT1CypN0 stage IA; repeat HER-2 negative ratio 1.41 4. Adjuvant antiestrogen therapy With letrozole 2.5 mg daily to be started March  2018 -------------------------------------------------------------------------------------------------------------------------------------------------- Pathology counseling: I discussed the final pathology report of the patient provided  a copy of this report. I discussed the margins as well as lymph node surgeries. We also discussed the final staging along with previously performed ER/PR and HER-2/neu testing.  Plan: 1. Continue maintenance Herceptin and Perjeta every 3 weeks 2. antiestrogen therapy with letrozole 2.5 mg daily started March 2018 3. Role of Neratinib discussed which could be considered after conclusion of one year of Herceptin. We had lengthy discussions regarding surveillance strategies. I discussed with her that there is no role of 14 scanning on any blood work. Tumor markers are not to be done in her situation because it's not start of care. She will undergo breast examinations periodically and annual mammograms on the right breast. Scans only if she has any symptoms.  Return to clinic every 3 weeks for Herceptin and Perjeta and every 6 weeks for M.D. follow-up. I spent 25 minutes talking to the patient of which more than half was spent in counseling and coordination of care.  No orders of the defined types were placed in this encounter.  The patient has a good understanding of the overall plan. she agrees with it. she will call with any problems that may develop before the next visit here.   Rulon Eisenmenger, MD 05/26/16

## 2016-06-04 NOTE — H&P (Signed)
Subjective:     Patient ID: Patricia Chandler is a 61 y.o. female.  HPI  2 weeks post op right NSM with immediate expander reconstruction. Seen by Dr. [name] yesterday, next Herceptin in 2 weeks. Drain 21/23/22/19  Presented following diagnostic MMG 6 month follow up following benign right breast biospy 2015. Two masses noted : a 1.7 cm mass located within the right breast at 2 o'clock position 4 cm from the nipple and a 1.3 cm mass located within the right breast at 2 o'clock position 6 cm from the nipple.  7 mm satellite nodule located between these 2 masses. [place] axilla negative. The tumor that is 4cm from the nipple at 2:00 position was a IDC, ER/PR +,  HER-2 +. The second tumor which measured 1.3 cm which is at 6 cm from the nipple is IDC ER/PR +HER-2 -.  MRI showed right UIQ 3 adjacent enhancing masses measuring 1.7 x 1.5 x 1.4 cm, 1.0 x 0.7 x 0.6 cm and 1.4 x 1.3 x 1.3 cm. The masses span an area of 3.7 x 1.4 x 1.4 cm. A well-circumscribed 8 mm enhancing mass in the upper-outer quadrant of the right breast corresponding with an intramammary lymph node that has been stable since 2013. Completed neoadjuvant chemotherapy. Final MRI with resolution of the 2 biopsied malignancies. Final pathology IDC, 1.3 cm, low-grade DCIS, LVI present, margins negative, 0/1 lymph node negative; repeat HER-2 negative. Seen by Dr. Lindi Adie yesterday and plan continue Herceptin, start letrozole 06/2016.  Genetics negative  History significant for augmentation 2005 with Dr. Wendy Poet. Patient has no information regarding her implants, states they are saline. Prior to augmentation A cup, prior to mastectomy D. "Could go down a size." Right mastectomy 177g Removed intact smooth saline round implant with label "Mentor [id number]." Approximately 330 ml drained from this implant  Review of Systems     Objective:   Physical Exam  Cardiovascular: Normal rate, regular rhythm and normal heart sounds.    Pulmonary/Chest: Effort normal and breath sounds normal.   Chest: eschar dry nipple and portion areola, 3 to 6 o clock of NAC appears viable,  Drain serous scant Soft no cellulitis  Assessment:     Right breast ca multifocal UIQ History augmentation mammaplasty S/p right NSM, SLN, TE/ADM (Alloderm) reconstruction    Plan:   Drain removed, reviewed signs fluid collection inc erythema, fever increased swelling and to let me know ASAP if these develop. Continue compression and light activities.  NAC appears at least to have necrosis nipple and appr 75% NAC . Recommend debridement in OR. Reviewed transverse scar and may leave some areolar skin if viable to aid with wound closure. Plan early next week.   Natrelle [id number]-[id number] 500 cc tissue expander placed,  initial fill volume 240 ml + 80 ml = 320 ml total fill volume

## 2016-06-05 ENCOUNTER — Encounter (HOSPITAL_COMMUNITY): Payer: Self-pay | Admitting: *Deleted

## 2016-06-06 DIAGNOSIS — R0989 Other specified symptoms and signs involving the circulatory and respiratory systems: Secondary | ICD-10-CM

## 2016-06-06 HISTORY — DX: Other specified symptoms and signs involving the circulatory and respiratory systems: R09.89

## 2016-06-07 NOTE — Anesthesia Preprocedure Evaluation (Addendum)
Anesthesia Evaluation  Patient identified by MRN, date of birth, ID band Patient awake    Reviewed: Allergy & Precautions, NPO status , Patient's Chart, lab work & pertinent test results  History of Anesthesia Complications (+) history of anesthetic complications  Airway Mallampati: II  TM Distance: <3 FB   Mouth opening: Limited Mouth Opening  Dental  (+) Teeth Intact   Pulmonary neg pulmonary ROS,    breath sounds clear to auscultation       Cardiovascular  Rhythm:Regular Rate:Normal  03/2016 Echo. Normal EF   Neuro/Psych negative neurological ROS     GI/Hepatic GERD  ,  Endo/Other    Renal/GU      Musculoskeletal   Abdominal   Peds  Hematology  (+) anemia ,   Anesthesia Other Findings   Reproductive/Obstetrics                            Lab Results  Component Value Date   WBC 5.5 05/26/2016   HGB 8.5 (L) 05/26/2016   HCT 26.5 (L) 05/26/2016   MCV 101.1 (H) 05/26/2016   PLT 215 05/26/2016   Lab Results  Component Value Date   CREATININE 0.9 05/26/2016   BUN 21.0 05/26/2016   NA 141 05/26/2016   K 3.9 05/26/2016   CL 111 01/12/2016   CO2 24 05/26/2016    Anesthesia Physical  Anesthesia Plan  ASA: II  Anesthesia Plan: General   Post-op Pain Management:    Induction: Intravenous  Airway Management Planned: Oral ETT  Additional Equipment:   Intra-op Plan:   Post-operative Plan: Extubation in OR  Informed Consent: I have reviewed the patients History and Physical, chart, labs and discussed the procedure including the risks, benefits and alternatives for the proposed anesthesia with the patient or authorized representative who has indicated his/her understanding and acceptance.   Dental advisory given  Plan Discussed with:   Anesthesia Plan Comments:        Anesthesia Quick Evaluation

## 2016-06-08 ENCOUNTER — Ambulatory Visit (HOSPITAL_COMMUNITY): Payer: BLUE CROSS/BLUE SHIELD | Admitting: Anesthesiology

## 2016-06-08 ENCOUNTER — Encounter (HOSPITAL_COMMUNITY): Payer: Self-pay | Admitting: Urology

## 2016-06-08 ENCOUNTER — Ambulatory Visit (HOSPITAL_COMMUNITY)
Admission: RE | Admit: 2016-06-08 | Discharge: 2016-06-08 | Disposition: A | Discharge status: Home / Self Care | Payer: BLUE CROSS/BLUE SHIELD | Source: Ambulatory Visit | Attending: Plastic Surgery | Admitting: Plastic Surgery

## 2016-06-08 ENCOUNTER — Encounter (HOSPITAL_COMMUNITY)
Admission: RE | Disposition: A | Discharge status: Home / Self Care | Payer: Self-pay | Source: Ambulatory Visit | Attending: Plastic Surgery

## 2016-06-08 DIAGNOSIS — Z9011 Acquired absence of right breast and nipple: Secondary | ICD-10-CM | POA: Diagnosis not present

## 2016-06-08 DIAGNOSIS — T86828 Other complications of skin graft (allograft) (autograft): Secondary | ICD-10-CM | POA: Diagnosis not present

## 2016-06-08 DIAGNOSIS — K219 Gastro-esophageal reflux disease without esophagitis: Secondary | ICD-10-CM | POA: Diagnosis not present

## 2016-06-08 DIAGNOSIS — Z79811 Long term (current) use of aromatase inhibitors: Secondary | ICD-10-CM | POA: Diagnosis not present

## 2016-06-08 DIAGNOSIS — Z853 Personal history of malignant neoplasm of breast: Secondary | ICD-10-CM | POA: Insufficient documentation

## 2016-06-08 DIAGNOSIS — Z79899 Other long term (current) drug therapy: Secondary | ICD-10-CM | POA: Insufficient documentation

## 2016-06-08 DIAGNOSIS — Z9221 Personal history of antineoplastic chemotherapy: Secondary | ICD-10-CM | POA: Diagnosis not present

## 2016-06-08 DIAGNOSIS — Y838 Other surgical procedures as the cause of abnormal reaction of the patient, or of later complication, without mention of misadventure at the time of the procedure: Secondary | ICD-10-CM | POA: Insufficient documentation

## 2016-06-08 HISTORY — DX: Family history of other specified conditions: Z84.89

## 2016-06-08 HISTORY — PX: DEBRIDEMENT AND CLOSURE WOUND: SHX5614

## 2016-06-08 LAB — CBC
HCT: 28.2 % — ABNORMAL LOW (ref 36.0–46.0)
HEMOGLOBIN: 9.3 g/dL — AB (ref 12.0–15.0)
MCH: 32.2 pg (ref 26.0–34.0)
MCHC: 33 g/dL (ref 30.0–36.0)
MCV: 97.6 fL (ref 78.0–100.0)
PLATELETS: 254 10*3/uL (ref 150–400)
RBC: 2.89 MIL/uL — AB (ref 3.87–5.11)
RDW: 13.6 % (ref 11.5–15.5)
WBC: 4.9 10*3/uL (ref 4.0–10.5)

## 2016-06-08 SURGERY — DEBRIDEMENT, WOUND, WITH CLOSURE
Anesthesia: General | Site: Breast | Laterality: Right

## 2016-06-08 MED ORDER — SUCCINYLCHOLINE CHLORIDE 200 MG/10ML IV SOSY
PREFILLED_SYRINGE | INTRAVENOUS | Status: DC | PRN
  Administered 2016-06-08: 100 mg via INTRAVENOUS

## 2016-06-08 MED ORDER — 0.9 % SODIUM CHLORIDE (POUR BTL) OPTIME
TOPICAL | Status: DC | PRN
  Administered 2016-06-08: 1000 mL

## 2016-06-08 MED ORDER — SODIUM CHLORIDE 0.9 % IR SOLN
Status: DC | PRN
  Administered 2016-06-08: 500 mL

## 2016-06-08 MED ORDER — DEXAMETHASONE SODIUM PHOSPHATE 10 MG/ML IJ SOLN
INTRAMUSCULAR | Status: DC | PRN
  Administered 2016-06-08: 10 mg via INTRAVENOUS

## 2016-06-08 MED ORDER — CEFAZOLIN SODIUM-DEXTROSE 2-4 GM/100ML-% IV SOLN
2.0000 g | INTRAVENOUS | Status: AC
  Administered 2016-06-08: 2 g via INTRAVENOUS

## 2016-06-08 MED ORDER — SULFAMETHOXAZOLE-TRIMETHOPRIM 800-160 MG PO TABS: 1.0000 | ORAL_TABLET | Freq: Two times a day (BID) | ORAL | 0 refills | Status: DC

## 2016-06-08 MED ORDER — LIDOCAINE 2% (20 MG/ML) 5 ML SYRINGE
INTRAMUSCULAR | Status: AC
  Filled 2016-06-08: qty 5

## 2016-06-08 MED ORDER — CEFAZOLIN SODIUM-DEXTROSE 2-4 GM/100ML-% IV SOLN
INTRAVENOUS | Status: AC
  Filled 2016-06-08: qty 100

## 2016-06-08 MED ORDER — MIDAZOLAM HCL 2 MG/2ML IJ SOLN
INTRAMUSCULAR | Status: AC
  Filled 2016-06-08: qty 2

## 2016-06-08 MED ORDER — LACTATED RINGERS IV SOLN
INTRAVENOUS | Status: DC | PRN
Start: 2016-06-08 — End: 2016-06-08
  Administered 2016-06-08: 07:00:00 via INTRAVENOUS

## 2016-06-08 MED ORDER — FENTANYL CITRATE (PF) 100 MCG/2ML IJ SOLN
INTRAMUSCULAR | Status: DC | PRN
Start: 2016-06-08 — End: 2016-06-08
  Administered 2016-06-08 (×2): 50 ug via INTRAVENOUS

## 2016-06-08 MED ORDER — PHENYLEPHRINE HCL 10 MG/ML IJ SOLN
INTRAVENOUS | Status: DC | PRN
  Administered 2016-06-08: 20 ug/min via INTRAVENOUS

## 2016-06-08 MED ORDER — LIDOCAINE 2% (20 MG/ML) 5 ML SYRINGE
INTRAMUSCULAR | Status: DC | PRN
  Administered 2016-06-08: 60 mg via INTRAVENOUS

## 2016-06-08 MED ORDER — PROPOFOL 10 MG/ML IV BOLUS
INTRAVENOUS | Status: DC | PRN
Start: 2016-06-08 — End: 2016-06-08
  Administered 2016-06-08: 110 mg via INTRAVENOUS

## 2016-06-08 MED ORDER — SUCCINYLCHOLINE CHLORIDE 200 MG/10ML IV SOSY
PREFILLED_SYRINGE | INTRAVENOUS | Status: AC
  Filled 2016-06-08: qty 10

## 2016-06-08 MED ORDER — ONDANSETRON HCL 4 MG/2ML IJ SOLN
INTRAMUSCULAR | Status: DC | PRN
  Administered 2016-06-08: 4 mg via INTRAVENOUS

## 2016-06-08 MED ORDER — DEXAMETHASONE SODIUM PHOSPHATE 10 MG/ML IJ SOLN
INTRAMUSCULAR | Status: AC
  Filled 2016-06-08: qty 1

## 2016-06-08 MED ORDER — PROMETHAZINE HCL 25 MG/ML IJ SOLN: 6.2500 mg | INTRAMUSCULAR | Status: DC | PRN

## 2016-06-08 MED ORDER — PROPOFOL 10 MG/ML IV BOLUS
INTRAVENOUS | Status: AC
  Filled 2016-06-08: qty 40

## 2016-06-08 MED ORDER — PHENYLEPHRINE 40 MCG/ML (10ML) SYRINGE FOR IV PUSH (FOR BLOOD PRESSURE SUPPORT)
PREFILLED_SYRINGE | INTRAVENOUS | Status: AC
  Filled 2016-06-08: qty 10

## 2016-06-08 MED ORDER — MIDAZOLAM HCL 5 MG/5ML IJ SOLN
INTRAMUSCULAR | Status: DC | PRN
  Administered 2016-06-08: 2 mg via INTRAVENOUS

## 2016-06-08 MED ORDER — ONDANSETRON HCL 4 MG/2ML IJ SOLN
INTRAMUSCULAR | Status: AC
  Filled 2016-06-08: qty 2

## 2016-06-08 MED ORDER — PHENYLEPHRINE 40 MCG/ML (10ML) SYRINGE FOR IV PUSH (FOR BLOOD PRESSURE SUPPORT)
PREFILLED_SYRINGE | INTRAVENOUS | Status: DC | PRN
  Administered 2016-06-08 (×4): 80 ug via INTRAVENOUS

## 2016-06-08 MED ORDER — HYDROMORPHONE HCL 1 MG/ML IJ SOLN: 0.2500 mg | INTRAMUSCULAR | Status: DC | PRN

## 2016-06-08 MED ORDER — FENTANYL CITRATE (PF) 100 MCG/2ML IJ SOLN
INTRAMUSCULAR | Status: AC
  Filled 2016-06-08: qty 2

## 2016-06-08 MED ORDER — CHLORHEXIDINE GLUCONATE CLOTH 2 % EX PADS: 6.0000 | MEDICATED_PAD | Freq: Once | CUTANEOUS | Status: DC

## 2016-06-08 MED ORDER — EPHEDRINE 5 MG/ML INJ
INTRAVENOUS | Status: AC
  Filled 2016-06-08: qty 10

## 2016-06-08 SURGICAL SUPPLY — 68 items
ADH SKN CLS APL DERMABOND .7 (GAUZE/BANDAGES/DRESSINGS) ×1
BAG DECANTER FOR FLEXI CONT (MISCELLANEOUS) ×2 IMPLANT
BINDER BREAST LRG (GAUZE/BANDAGES/DRESSINGS) ×1 IMPLANT
BINDER BREAST XLRG (GAUZE/BANDAGES/DRESSINGS) IMPLANT
CANISTER SUCTION 2500CC (MISCELLANEOUS) ×2 IMPLANT
CHLORAPREP W/TINT 26ML (MISCELLANEOUS) ×2 IMPLANT
CLSR STERI-STRIP ANTIMIC 1/2X4 (GAUZE/BANDAGES/DRESSINGS) ×2 IMPLANT
CONT SPEC 4OZ CLIKSEAL STRL BL (MISCELLANEOUS) ×1 IMPLANT
COVER SURGICAL LIGHT HANDLE (MISCELLANEOUS) ×2 IMPLANT
DERMABOND ADVANCED (GAUZE/BANDAGES/DRESSINGS) ×1
DERMABOND ADVANCED .7 DNX12 (GAUZE/BANDAGES/DRESSINGS) ×2 IMPLANT
DRAIN CHANNEL 15F RND FF W/TCR (WOUND CARE) IMPLANT
DRAPE INCISE IOBAN 66X45 STRL (DRAPES) IMPLANT
DRAPE ORTHO SPLIT 77X108 STRL (DRAPES) ×4
DRAPE PROXIMA HALF (DRAPES) ×4 IMPLANT
DRAPE SURG ORHT 6 SPLT 77X108 (DRAPES) ×2 IMPLANT
DRAPE WARM FLUID 44X44 (DRAPE) ×2 IMPLANT
DRSG PAD ABDOMINAL 8X10 ST (GAUZE/BANDAGES/DRESSINGS) ×4 IMPLANT
DRSG TEGADERM 2-3/8X2-3/4 SM (GAUZE/BANDAGES/DRESSINGS) ×2 IMPLANT
DRSG TEGADERM 4X4.75 (GAUZE/BANDAGES/DRESSINGS) ×4 IMPLANT
ELECT BLADE 4.0 EZ CLEAN MEGAD (MISCELLANEOUS) ×2
ELECT CAUTERY BLADE 6.4 (BLADE) ×2 IMPLANT
ELECT COATED BLADE 2.86 ST (ELECTRODE) ×3 IMPLANT
ELECT REM PT RETURN 9FT ADLT (ELECTROSURGICAL) ×2
ELECTRODE BLDE 4.0 EZ CLN MEGD (MISCELLANEOUS) ×1 IMPLANT
ELECTRODE REM PT RTRN 9FT ADLT (ELECTROSURGICAL) ×1 IMPLANT
EVACUATOR SILICONE 100CC (DRAIN) IMPLANT
GAUZE SPONGE 4X4 12PLY STRL (GAUZE/BANDAGES/DRESSINGS) ×2 IMPLANT
GLOVE BIO SURGEON STRL SZ 6 (GLOVE) ×6 IMPLANT
GLOVE BIO SURGEON STRL SZ8 (GLOVE) ×2 IMPLANT
GLOVE BIOGEL PI IND STRL 7.0 (GLOVE) IMPLANT
GLOVE BIOGEL PI IND STRL 8.5 (GLOVE) IMPLANT
GLOVE BIOGEL PI INDICATOR 7.0 (GLOVE) ×1
GLOVE BIOGEL PI INDICATOR 8.5 (GLOVE) ×1
GOWN STRL REUS W/ TWL LRG LVL3 (GOWN DISPOSABLE) ×2 IMPLANT
GOWN STRL REUS W/TWL LRG LVL3 (GOWN DISPOSABLE) ×4
KIT BASIN OR (CUSTOM PROCEDURE TRAY) ×2 IMPLANT
KIT ROOM TURNOVER OR (KITS) ×2 IMPLANT
MARKER SKIN DUAL TIP RULER LAB (MISCELLANEOUS) ×2 IMPLANT
NS IRRIG 1000ML POUR BTL (IV SOLUTION) ×4 IMPLANT
PACK GENERAL/GYN (CUSTOM PROCEDURE TRAY) ×2 IMPLANT
PAD ABD 8X10 STRL (GAUZE/BANDAGES/DRESSINGS) ×1 IMPLANT
PAD ARMBOARD 7.5X6 YLW CONV (MISCELLANEOUS) ×2 IMPLANT
PIN SAFETY STERILE (MISCELLANEOUS) ×2 IMPLANT
SET ASEPTIC TRANSFER (MISCELLANEOUS) ×2 IMPLANT
SOLUTION BETADINE 4OZ (MISCELLANEOUS) ×2 IMPLANT
STAPLER VISISTAT 35W (STAPLE) ×2 IMPLANT
STRIP CLOSURE SKIN 1/2X4 (GAUZE/BANDAGES/DRESSINGS) ×2 IMPLANT
SUT CHROMIC 4 0 PS 2 18 (SUTURE) IMPLANT
SUT ETHILON 2 0 FS 18 (SUTURE) ×4 IMPLANT
SUT ETHILON 4 0 PS 2 18 (SUTURE) ×2 IMPLANT
SUT MNCRL 3 0 RB1 (SUTURE) IMPLANT
SUT MNCRL AB 3-0 PS2 18 (SUTURE) ×2 IMPLANT
SUT MNCRL AB 4-0 PS2 18 (SUTURE) ×1 IMPLANT
SUT MONOCRYL 3 0 RB1 (SUTURE)
SUT VIC AB 0 CT1 27 (SUTURE)
SUT VIC AB 0 CT1 27XBRD ANBCTR (SUTURE) IMPLANT
SUT VIC AB 3-0 SH 27 (SUTURE)
SUT VIC AB 3-0 SH 27X BRD (SUTURE) IMPLANT
SUT VICRYL 3 0 (SUTURE) IMPLANT
SUT VICRYL 4-0 PS2 18IN ABS (SUTURE) IMPLANT
SUT VLOC 180 0 24IN GS25 (SUTURE) IMPLANT
SYR BULB IRRIGATION 50ML (SYRINGE) ×2 IMPLANT
TAPE STRIPS DRAPE STRL (GAUZE/BANDAGES/DRESSINGS) ×2 IMPLANT
TOWEL OR 17X24 6PK STRL BLUE (TOWEL DISPOSABLE) ×2 IMPLANT
TOWEL OR 17X26 10 PK STRL BLUE (TOWEL DISPOSABLE) ×2 IMPLANT
TRAY FOLEY CATH 16FRSI W/METER (SET/KITS/TRAYS/PACK) IMPLANT
TUBE CONNECTING 12X1/4 (SUCTIONS) ×2 IMPLANT

## 2016-06-08 NOTE — Op Note (Signed)
Operative Note   DATE OF OPERATION: 2.19.18  LOCATION: [facility]-outpatient  SURGICAL DIVISION: Plastic Surgery  PREOPERATIVE DIAGNOSES:  1. Right breast cancer 2. Skin flap necrosis  POSTOPERATIVE DIAGNOSES:  same  PROCEDURE:  Excisional debridement right mastectomy flap skin subcutaneous tissue 2 x 4 cm  SURGEON: Irene Limbo MD MBA  ASSISTANT: none  ANESTHESIA:  General.   EBL: 10 ml  COMPLICATIONS: None immediate.   INDICATIONS FOR PROCEDURE:  The patient, Patricia Chandler, is a 61 y.o. female born on [birth year], is here for debridement right mastectomy flap. She is three weeks post operative from nipple sparing mastectomy and immediate tissue expander, acellular dermis reconstruction.   FINDINGS: Full thickness dry eschar for right NAC. Additional 60 ml saline infiltrated in expander.  DESCRIPTION OF PROCEDURE:  The patient's operative site was marked with the patient in the preoperative area. The patient was taken to the operating room. SCDs were placed and IV antibiotics were given. The patient's operative site was prepped and draped in a sterile fashion. A time out was performed and all information was confirmed to be correct. Sharp excision of eschar and surrounding ischemic skin completed with knife, area 2 x 4 cm. Underlying subcutaneous fat also noted to be ischemic and excised with scissors. Following this the acellular dermis was exposed. Cavity irrigated with polymyxin-bacitracin solution. Skin flaps sewn to acellular dermis surrounding wound with 3-0 monocryl. Wound closed in layers with 3-0 in superficial fascia and 4-0 monocryl in dermis. Skin closure with 4-0 nylon. Port accessed and additional 60 ml saline infiltrated in expander. Dermabond applied followed by steri strips. Dry dressing and breast binder applied.   The patient was allowed to wake from anesthesia, extubated and taken to the recovery room in satisfactory condition.   SPECIMENS: right mastectomy  flap (nipple, areola)  DRAINS: none  Irene Limbo, MD [facility] Plastic & Reconstructive Surgery [phone], pin [phone]

## 2016-06-08 NOTE — Transfer of Care (Signed)
Immediate Anesthesia Transfer of Care Note  Patient: Patricia Chandler  Procedure(s) Performed: Procedure(s): DEBRIDEMENT RIGHT MASTECTOMY FLAP, TISSUE EXPANSION OF RIGHT CHEST (Right)  Patient Location: PACU  Anesthesia Type:General  Level of Consciousness: awake, alert , oriented and patient cooperative  Airway & Oxygen Therapy: Patient Spontanous Breathing and Patient connected to nasal cannula oxygen  Post-op Assessment: Report given to RN, Post -op Vital signs reviewed and stable and Patient moving all extremities  Post vital signs: Reviewed and stable  Last Vitals:  Vitals:   06/08/16 0811 06/08/16 0815  BP:  107/79  Pulse: 83 83  Resp:  10  Temp: 36.3 C     Last Pain:  Vitals:   06/08/16 0602  TempSrc:   PainSc: 2       Patients Stated Pain Goal: 4 ([id number])  Complications: No apparent anesthesia complications

## 2016-06-08 NOTE — Anesthesia Postprocedure Evaluation (Signed)
Anesthesia Post Note  Patient: Patricia Chandler  Procedure(s) Performed: Procedure(s) (LRB): DEBRIDEMENT RIGHT MASTECTOMY FLAP, TISSUE EXPANSION OF RIGHT CHEST (Right)  Patient location during evaluation: PACU Anesthesia Type: General Level of consciousness: awake and alert Pain management: pain level controlled Vital Signs Assessment: post-procedure vital signs reviewed and stable Respiratory status: spontaneous breathing, nonlabored ventilation, respiratory function stable and patient connected to nasal cannula oxygen Cardiovascular status: blood pressure returned to baseline and stable Postop Assessment: no signs of nausea or vomiting Anesthetic complications: no       Last Vitals:  Vitals:   06/08/16 0830 06/08/16 0845  BP: 110/81 115/77  Pulse: 82 84  Resp: 12 17  Temp:  36.4 C    Last Pain:  Vitals:   06/08/16 0845  TempSrc:   PainSc: 0-No pain                 Tiajuana Amass

## 2016-06-08 NOTE — Interval H&P Note (Signed)
History and Physical Interval Note:  06/08/2016 6:52 AM  Patricia Chandler  has presented today for surgery, with the diagnosis of RIGHT BREAST CANCER  The various methods of treatment have been discussed with the patient and family. After consideration of risks, benefits and other options for treatment, the patient has consented to  Procedure(s): DEBRIDEMENT RIGHT MASTECTOMY FLAP, TISSUE EXPANSION OF RIGHT CHEST (Right) as a surgical intervention .  The patient's history has been reviewed, patient examined, no change in status, stable for surgery.  I have reviewed the patient's chart and labs.  Questions were answered to the patient's satisfaction.     Aribelle Mccosh

## 2016-06-08 NOTE — OR Nursing (Signed)
Dr. Iran Planas filled right tissue expander with 60 mL of normal saline

## 2016-06-08 NOTE — Anesthesia Procedure Notes (Signed)
Procedure Name: Intubation Date/Time: 06/08/2016 7:23 AM Performed by: Willeen Cass P Pre-anesthesia Checklist: Patient identified, Emergency Drugs available, Suction available and Patient being monitored Patient Re-evaluated:Patient Re-evaluated prior to inductionOxygen Delivery Method: Circle System Utilized Preoxygenation: Pre-oxygenation with 100% oxygen Intubation Type: IV induction Ventilation: Mask ventilation without difficulty Laryngoscope Size: Mac and 3 Grade View: Grade I Tube type: Oral Tube size: 6.5 mm Number of attempts: 1 Airway Equipment and Method: Stylet Placement Confirmation: ETT inserted through vocal cords under direct vision,  positive ETCO2 and breath sounds checked- equal and bilateral Secured at: 22 cm Tube secured with: Tape Dental Injury: Teeth and Oropharynx as per pre-operative assessment

## 2016-06-09 ENCOUNTER — Other Ambulatory Visit: Payer: Self-pay | Admitting: *Deleted

## 2016-06-09 ENCOUNTER — Encounter (HOSPITAL_COMMUNITY): Payer: Self-pay | Admitting: Plastic Surgery

## 2016-06-09 MED ORDER — ZOLPIDEM TARTRATE 5 MG PO TABS: 5.0000 mg | ORAL_TABLET | Freq: Every evening | ORAL | 1 refills | Status: DC | PRN

## 2016-06-09 MED FILL — ZOLPIDEM TARTRATE 5 MG TAB: 5 | 30 days supply | Qty: 30 | Fill #0

## 2016-06-15 ENCOUNTER — Other Ambulatory Visit: Payer: Self-pay

## 2016-06-15 ENCOUNTER — Encounter: Payer: Self-pay | Admitting: Family Medicine

## 2016-06-15 ENCOUNTER — Ambulatory Visit (INDEPENDENT_AMBULATORY_CARE_PROVIDER_SITE_OTHER): Payer: BLUE CROSS/BLUE SHIELD | Admitting: Family Medicine

## 2016-06-15 VITALS — BP 98/66 | HR 96 | Temp 98.1°F | Ht 63.0 in | Wt 128.6 lb

## 2016-06-15 DIAGNOSIS — T451X5A Adverse effect of antineoplastic and immunosuppressive drugs, initial encounter: Secondary | ICD-10-CM

## 2016-06-15 DIAGNOSIS — D6959 Other secondary thrombocytopenia: Secondary | ICD-10-CM | POA: Diagnosis not present

## 2016-06-15 DIAGNOSIS — C50211 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 female breast: Secondary | ICD-10-CM | POA: Diagnosis not present

## 2016-06-15 DIAGNOSIS — Z95828 Presence of other vascular implants and grafts: Secondary | ICD-10-CM | POA: Diagnosis not present

## 2016-06-15 DIAGNOSIS — Z8619 Personal history of other infectious and parasitic diseases: Secondary | ICD-10-CM | POA: Diagnosis not present

## 2016-06-15 DIAGNOSIS — K2100 Gastro-esophageal reflux disease with esophagitis, without bleeding: Secondary | ICD-10-CM

## 2016-06-15 DIAGNOSIS — Z170 Estrogen receptor positive status [ER+]: Secondary | ICD-10-CM | POA: Diagnosis not present

## 2016-06-15 NOTE — Assessment & Plan Note (Signed)
After first chemo session- hospitalized 5 days

## 2016-06-15 NOTE — Patient Instructions (Addendum)
See if in 1 month you can go down to once a day protonix.

## 2016-06-15 NOTE — Assessment & Plan Note (Signed)
S: noted on problem list from chemotherayp. Appears resolved on last labs A/P: we agreed to update this with labs in about 3 months for CPE(or follow labs from oncology). Also had dealt with some anemia which hopefully is improving (chemo and surgery likely contributing)

## 2016-06-15 NOTE — Assessment & Plan Note (Signed)
S: Chemo sept 2017-->Right mastectomy 05/19/16, nipple necrosis surgery 2 weeks later 06/08/16. Letrozole 5 years planned (femara) A/P: Patient really seems to be doing well though she has her ups and downs. We spent an extended time discussing the stress she has been under as a result of chemo/surgeries/decision making.

## 2016-06-15 NOTE — Assessment & Plan Note (Signed)
S: protonix twice a day during chemo. Her main symptom had been large amounts of discharge/clearance in throat in AMs. Had seen ENT in the past.  A/P: we opted over the next month if she is doing well still- to trial back down to once a day protonix.

## 2016-06-15 NOTE — Progress Notes (Signed)
Pre visit review using our clinic review tool, if applicable. No additional management support is needed unless otherwise documented below in the visit note. 

## 2016-06-15 NOTE — Progress Notes (Signed)
Phone: [phone]  Subjective:  Patient presents today to establish care.  Prior patient of Kelton Pillar with [facility]. Chief complaint-noted.   See problem oriented charting  The following were reviewed and entered/updated in epic: Past Medical History:  Diagnosis Date  . Anxiety   . Breast cancer ([place])   . Chronic kidney disease   . Complication of anesthesia 12/2015   gastric content to come in LMA, needed intubation, had severe laryngitis- lasted loner than 1 month  . Diverticulosis   . Family history of adverse reaction to anesthesia    daughter - nausea  . Family history of breast cancer   . Genital herpes   . GERD (gastroesophageal reflux disease)   . Heart murmur    had ECHO 12 yrs ago was neg  . Internal hemorrhoid   . Pneumonia    3 times- viral last time approx 2014   Patient Active Problem List   Diagnosis Date Noted  . Port catheter in place 01/08/2016    Priority: High  . Breast cancer of upper-inner quadrant of right female breast ([place]) 12/18/2015    Priority: High  . Chemotherapy-induced thrombocytopenia 03/30/2016    Priority: Medium  . GERD (gastroesophageal reflux disease) 01/08/2016    Priority: Medium  . Genetic testing 01/10/2016    Priority: Low  . History of sepsis 01/08/2016    Priority: Low  . Family history of breast cancer     Priority: Low  . Internal hemorrhoid 01/26/2011    Priority: Low   Past Surgical History:  Procedure Laterality Date  . BREAST ENHANCEMENT SURGERY  2005  . BREAST LUMPECTOMY  1973   right/BENIGN  . BREAST RECONSTRUCTION WITH PLACEMENT OF TISSUE EXPANDER AND FLEX HD (ACELLULAR HYDRATED DERMIS) Right 05/19/2016   Procedure: RIGHT BREAST RECONSTRUCTION WITH PLACEMENT OF TISSUE EXPANDER AND  ALLODERM.;  Surgeon: Irene Limbo, MD;  Location: [facility];  Service: Plastics;  Laterality: Right;  . COLONOSCOPY    . DEBRIDEMENT AND CLOSURE WOUND Right 06/08/2016   Procedure: DEBRIDEMENT  RIGHT MASTECTOMY FLAP, TISSUE EXPANSION OF RIGHT CHEST;  Surgeon: Irene Limbo, MD;  Location: [facility];  Service: Plastics;  Laterality: Right;  . NIPPLE SPARING MASTECTOMY/SENTINAL LYMPH NODE BIOPSY/RECONSTRUCTION/PLACEMENT OF TISSUE EXPANDER Right 05/19/2016   Procedure: RIGHT NIPPLE SPARING MASTECTOMY WITH SENTINAL LYMPH NODE BIOPSY WITH BLUE DYE INJECTION;  Surgeon: Rolm Bookbinder, MD;  Location: [facility];  Service: General;  Laterality: Right;  . PORTACATH PLACEMENT N/A 12/30/2015   Procedure: INSERTION PORT-A-CATH WITH [place];  Surgeon: Rolm Bookbinder, MD;  Location: [facility];  Service: General;  Laterality: N/A;    Family History  Problem Relation Age of Onset  . Breast cancer Maternal Aunt     dx in her 21s  . Stroke Father     late 82s  . Hypertension Father   . Pulmonary fibrosis Mother   . Healthy Brother   . Breast cancer Paternal Aunt 78  . Cancer Paternal Grandmother     abdominal; cervical vs uterine dx in her 73s  . Parkinson's disease Maternal Grandfather   . Other Brother     accident    Medications- reviewed and updated Current Outpatient Prescriptions  Medication Sig Dispense Refill  . acetaminophen (TYLENOL) 500 MG tablet Take 500 mg by mouth every 6 (six) hours as needed.    . ALPRAZolam (XANAX) 0.5 MG tablet TAKE 1 TABLET BY MOUTH AT BEDTIME AS NEEDED FOR ANXIETY 60 tablet 0  .  Ascorbic Acid (VITAMIN C) 1000 MG tablet Take 1,000 mg by mouth daily.    . calcium carbonate (OS-CAL) 600 MG TABS tablet Take 600 mg by mouth daily.    . Cholecalciferol (VITAMIN D3) 1000 units CAPS Take 4,000 Units by mouth daily.    Marland Kitchen ibuprofen (ADVIL,MOTRIN) 200 MG tablet Take 400-600 mg by mouth every 6 (six) hours as needed for moderate pain.    Marland Kitchen letrozole (FEMARA) 2.5 MG tablet Take 1 tablet (2.5 mg total) by mouth daily. 90 tablet 3  . lidocaine-prilocaine (EMLA) cream Apply 1 application topically daily as needed (port access).     Marland Kitchen  loperamide (IMODIUM) 2 MG capsule Take 2 mg by mouth as needed for diarrhea or loose stools.    . magnesium 30 MG tablet Take 60 mg by mouth daily.     . Multiple Vitamin (MULTIVITAMIN WITH MINERALS) TABS tablet Take 1 tablet by mouth daily.    . pantoprazole (PROTONIX) 40 MG tablet Take 40 mg by mouth 2 (two) times daily.   0  . Probiotic Product (PROBIOTIC DAILY PO) Take 1-2 capsules by mouth daily.     Marland Kitchen sulfamethoxazole-trimethoprim (BACTRIM DS,SEPTRA DS) 800-160 MG tablet Take 1 tablet by mouth 2 (two) times daily. 14 tablet 0  . zolpidem (AMBIEN) 5 MG tablet Take 1 tablet (5 mg total) by mouth at bedtime as needed for sleep. 30 tablet 1   No current facility-administered medications for this visit.     Allergies-reviewed and updated Allergies  Allergen Reactions  . No Known Allergies     Social History   Social History  . Marital status: Married    Spouse name: N/A  . Number of children: 2  . Years of education: N/A   Occupational History  . medical receptionist    Social History Main Topics  . Smoking status: Never Smoker  . Smokeless tobacco: Never Used  . Alcohol use Yes     Comment: social  . Drug use: No  . Sexual activity: Not Asked   Other Topics Concern  . None   Social History Narrative   Married 1978 (Al). 2 girls 33 and 36 in 2018. [name] lives in [place], [place] with 91 yo son (single right now). and [name] in [place], [place]--> now [place] with husband and 58 year old daughter (born with heart defect with missing valve and initially not closed VSD--still weighs heavily on family- likely will have to be repaired).       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    Graduated from [employer]. [employer] prior.       Hobbies: gardening, decorating, sewing, enjoys creating    ROS--Full ROS was completed Review of Systems  Constitutional: Positive for malaise/fatigue. Negative for  chills and fever.  HENT: Negative for hearing loss and tinnitus.   Eyes: Negative for blurred vision and double vision.  Respiratory: Negative for cough and shortness of breath.   Cardiovascular: Negative for chest pain and palpitations.  Gastrointestinal: Negative for heartburn and nausea.  Genitourinary: Negative for dysuria and urgency.  Musculoskeletal: Negative for myalgias and neck pain.  Neurological: Positive for weakness. Negative for dizziness and headaches.  Endo/Heme/Allergies: Negative for polydipsia. Does not bruise/bleed easily.  Psychiatric/Behavioral: Negative for hallucinations and substance abuse.   Objective: BP 98/66 (BP Location: Left Arm, Patient Position: Sitting, Cuff Size: Normal)   Pulse 96   Temp 98.1 F (36.7  C) (Oral)   Ht 5\' 3"  (1.6 m)   Wt 128 lb 9.6 oz (58.3 kg)   SpO2 98%   BMI 22.78 kg/m  Gen: NAD, bald, wears wrap for head HEENT: Mucous membranes are moist. Oropharynx normal. TM normal. Eyes: sclera and lids normal, PERRLA Neck: no thyromegaly, no cervical lymphadenopathy CV: RRR no murmurs rubs or gallops Lungs: CTAB no crackles, wheeze, rhonchi Abdomen: soft/nontender/nondistended/normal bowel sounds. No rebound or guarding.  Ext: no edema, 2+ pulses PT Skin: warm, dry Neuro: 5/5 strength in upper and lower extremities, normal gait, normal reflexes  Assessment/Plan:  History of sepsis After first chemo session- hospitalized 5 days  Breast cancer of upper-inner quadrant of right female breast ([place]) S: Chemo sept 2017-->Right mastectomy 05/19/16, nipple necrosis surgery 2 weeks later 06/08/16. Letrozole 5 years planned (femara) A/P: Patient really seems to be doing well though she has her ups and downs. We spent an extended time discussing the stress she has been under as a result of chemo/surgeries/decision making.   GERD (gastroesophageal reflux disease) S: protonix twice a day during chemo. Her main symptom had been large amounts of  discharge/clearance in throat in AMs. Had seen ENT in the past.  A/P: we opted over the next month if she is doing well still- to trial back down to once a day protonix.    Chemotherapy-induced thrombocytopenia S: noted on problem list from chemotherayp. Appears resolved on last labs A/P: we agreed to update this with labs in about 3 months for CPE(or follow labs from oncology). Also had dealt with some anemia which hopefully is improving (chemo and surgery likely contributing)   Return in about 3 months (around 09/12/2016) for physical. come fasting and we will update labs that day. .   The duration of face-to-face time during this visit was greater than 30 minutes. Greater than 50% of this time was spent in counseling, explanation of diagnosis, planning of further management, and/or coordination of care including primarily discussion of stress from her recent history with breast cancer and treatment.    Return precautions advised. Garret Reddish, MD

## 2016-06-16 ENCOUNTER — Ambulatory Visit: Payer: BLUE CROSS/BLUE SHIELD

## 2016-06-16 ENCOUNTER — Ambulatory Visit: Payer: BLUE CROSS/BLUE SHIELD | Admitting: Hematology and Oncology

## 2016-06-16 ENCOUNTER — Other Ambulatory Visit (HOSPITAL_BASED_OUTPATIENT_CLINIC_OR_DEPARTMENT_OTHER): Payer: BLUE CROSS/BLUE SHIELD

## 2016-06-16 ENCOUNTER — Ambulatory Visit (HOSPITAL_BASED_OUTPATIENT_CLINIC_OR_DEPARTMENT_OTHER): Payer: BLUE CROSS/BLUE SHIELD

## 2016-06-16 VITALS — BP 92/59 | HR 79 | Temp 97.7°F | Resp 17

## 2016-06-16 DIAGNOSIS — Z95828 Presence of other vascular implants and grafts: Secondary | ICD-10-CM

## 2016-06-16 DIAGNOSIS — C50211 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 female breast: Secondary | ICD-10-CM

## 2016-06-16 DIAGNOSIS — Z170 Estrogen receptor positive status [ER+]: Secondary | ICD-10-CM

## 2016-06-16 DIAGNOSIS — Z5112 Encounter for antineoplastic immunotherapy: Secondary | ICD-10-CM

## 2016-06-16 LAB — COMPREHENSIVE METABOLIC PANEL
ALT: 20 U/L (ref 0–55)
AST: 26 U/L (ref 5–34)
Albumin: 3.8 g/dL (ref 3.5–5.0)
Alkaline Phosphatase: 66 U/L (ref 40–150)
Anion Gap: 7 mEq/L (ref 3–11)
BUN: 21 mg/dL (ref 7.0–26.0)
CO2: 23 meq/L (ref 22–29)
Calcium: 9.3 mg/dL (ref 8.4–10.4)
Chloride: 107 mEq/L (ref 98–109)
Creatinine: 0.9 mg/dL (ref 0.6–1.1)
EGFR: 68 mL/min/{1.73_m2} — AB (ref >90)
GLUCOSE: 85 mg/dL (ref 70–140)
POTASSIUM: 4.2 meq/L (ref 3.5–5.1)
SODIUM: 136 meq/L (ref 136–145)
TOTAL PROTEIN: 6.2 g/dL — AB (ref 6.4–8.3)
Total Bilirubin: 0.23 mg/dL (ref 0.20–1.20)

## 2016-06-16 LAB — CBC WITH DIFFERENTIAL/PLATELET
BASO%: 0.8 % (ref 0.0–2.0)
Basophils Absolute: 0 10*3/uL (ref 0.0–0.1)
EOS ABS: 0.1 10*3/uL (ref 0.0–0.5)
EOS%: 3.2 % (ref 0.0–7.0)
HCT: 27.3 % — ABNORMAL LOW (ref 34.8–46.6)
HGB: 9.1 g/dL — ABNORMAL LOW (ref 11.6–15.9)
LYMPH%: 18.7 % (ref 14.0–49.7)
MCH: 32 pg (ref 25.1–34.0)
MCHC: 33.3 g/dL (ref 31.5–36.0)
MCV: 96.1 fL (ref 79.5–101.0)
MONO#: 0.6 10*3/uL (ref 0.1–0.9)
MONO%: 15.8 % — AB (ref 0.0–14.0)
NEUT%: 61.5 % (ref 38.4–76.8)
NEUTROS ABS: 2.3 10*3/uL (ref 1.5–6.5)
Platelets: 223 10*3/uL (ref 145–400)
RBC: 2.84 10*6/uL — AB (ref 3.70–5.45)
RDW: 13.7 % (ref 11.2–14.5)
WBC: 3.8 10*3/uL — AB (ref 3.9–10.3)
lymph#: 0.7 10*3/uL — ABNORMAL LOW (ref 0.9–3.3)

## 2016-06-16 MED ORDER — TRASTUZUMAB CHEMO 150 MG IV SOLR
6.0000 mg/kg | Freq: Once | INTRAVENOUS | Status: AC
  Administered 2016-06-16: 378 mg via INTRAVENOUS
  Filled 2016-06-16: qty 18

## 2016-06-16 MED ORDER — SODIUM CHLORIDE 0.9 % IJ SOLN
10.0000 mL | INTRAMUSCULAR | Status: DC | PRN
  Administered 2016-06-16: 10 mL via INTRAVENOUS
  Filled 2016-06-16: qty 10

## 2016-06-16 MED ORDER — SODIUM CHLORIDE 0.9 % IV SOLN
420.0000 mg | Freq: Once | INTRAVENOUS | Status: AC
  Administered 2016-06-16: 420 mg via INTRAVENOUS
  Filled 2016-06-16: qty 14

## 2016-06-16 MED ORDER — SODIUM CHLORIDE 0.9 % IV SOLN
Freq: Once | INTRAVENOUS | Status: AC
  Administered 2016-06-16: 11:00:00 via INTRAVENOUS

## 2016-06-16 MED ORDER — DIPHENHYDRAMINE HCL 25 MG PO CAPS
ORAL_CAPSULE | ORAL | Status: AC
  Filled 2016-06-16: qty 1

## 2016-06-16 MED ORDER — DIPHENHYDRAMINE HCL 25 MG PO CAPS
25.0000 mg | ORAL_CAPSULE | Freq: Once | ORAL | Status: AC
  Administered 2016-06-16: 25 mg via ORAL

## 2016-06-16 MED ORDER — HEPARIN SOD (PORK) LOCK FLUSH 100 UNIT/ML IV SOLN
500.0000 [IU] | Freq: Once | INTRAVENOUS | Status: AC | PRN
  Administered 2016-06-16: 500 [IU]
  Filled 2016-06-16: qty 5

## 2016-06-16 MED ORDER — SODIUM CHLORIDE 0.9% FLUSH
10.0000 mL | INTRAVENOUS | Status: DC | PRN
  Administered 2016-06-16: 10 mL
  Filled 2016-06-16: qty 10

## 2016-06-16 MED ORDER — ACETAMINOPHEN 325 MG PO TABS
ORAL_TABLET | ORAL | Status: AC
  Filled 2016-06-16: qty 2

## 2016-06-16 MED ORDER — ACETAMINOPHEN 325 MG PO TABS
650.0000 mg | ORAL_TABLET | Freq: Once | ORAL | Status: AC
  Administered 2016-06-16: 650 mg via ORAL

## 2016-06-16 NOTE — Patient Instructions (Signed)
[facility] Discharge Instructions for Patients Receiving Chemotherapy  Today you received the following chemotherapy agents Herceptin/Perjeta To help prevent nausea and vomiting after your treatment, we encourage you to take your nausea medication as prescribed.   If you develop nausea and vomiting that is not controlled by your nausea medication, call the clinic.   BELOW ARE SYMPTOMS THAT SHOULD BE REPORTED IMMEDIATELY:  *FEVER GREATER THAN 100.5 F  *CHILLS WITH OR WITHOUT FEVER  NAUSEA AND VOMITING THAT IS NOT CONTROLLED WITH YOUR NAUSEA MEDICATION  *UNUSUAL SHORTNESS OF BREATH  *UNUSUAL BRUISING OR BLEEDING  TENDERNESS IN MOUTH AND THROAT WITH OR WITHOUT PRESENCE OF ULCERS  *URINARY PROBLEMS  *BOWEL PROBLEMS  UNUSUAL RASH Items with * indicate a potential emergency and should be followed up as soon as possible.  Feel free to call the clinic you have any questions or concerns. The clinic phone number is [phone].  Please show the CHEMO ALERT CARD at check-in to the Emergency Department and triage nurse.  

## 2016-06-16 NOTE — Progress Notes (Signed)
Pt refused 30 minute post observation. Pt tolerated infusion well. Pt and VS stable at discharge.  

## 2016-06-24 ENCOUNTER — Ambulatory Visit: Payer: BLUE CROSS/BLUE SHIELD | Attending: Plastic Surgery | Admitting: Physical Therapy

## 2016-06-24 DIAGNOSIS — M25611 Stiffness of right shoulder, not elsewhere classified: Secondary | ICD-10-CM | POA: Insufficient documentation

## 2016-06-24 DIAGNOSIS — M79601 Pain in right arm: Secondary | ICD-10-CM | POA: Diagnosis present

## 2016-06-24 DIAGNOSIS — R293 Abnormal posture: Secondary | ICD-10-CM | POA: Diagnosis present

## 2016-06-24 DIAGNOSIS — M25511 Pain in right shoulder: Secondary | ICD-10-CM

## 2016-06-24 NOTE — Therapy (Signed)
[place], [place], [address] Phone: [phone]   Fax:  [phone]  Physical Therapy Evaluation  Patient Details  Name: Patricia Chandler MRN: [id number] Date of Birth: [birth year] Referring Provider: Dr. Irene Limbo  Encounter Date: 06/24/2016      PT End of Session - 06/24/16 1134    Visit Number 1   Number of Visits 12   Date for PT Re-Evaluation 07/22/16   PT Start Time 0941   PT Stop Time 1022   PT Time Calculation (min) 41 min   Activity Tolerance Patient tolerated treatment well   Behavior During Therapy [facility] for tasks assessed/performed      Past Medical History:  Diagnosis Date  . Anxiety   . Breast cancer ([place])   . Chronic kidney disease   . Complication of anesthesia 12/2015   gastric content to come in LMA, needed intubation, had severe laryngitis- lasted loner than 1 month  . Diverticulosis   . Family history of adverse reaction to anesthesia    daughter - nausea  . Family history of breast cancer   . Genital herpes   . GERD (gastroesophageal reflux disease)   . Heart murmur    had ECHO 12 yrs ago was neg  . Internal hemorrhoid   . Pneumonia    3 times- viral last time approx 2014    Past Surgical History:  Procedure Laterality Date  . BREAST ENHANCEMENT SURGERY  2005  . BREAST LUMPECTOMY  1973   right/BENIGN  . BREAST RECONSTRUCTION WITH PLACEMENT OF TISSUE EXPANDER AND FLEX HD (ACELLULAR HYDRATED DERMIS) Right 05/19/2016   Procedure: RIGHT BREAST RECONSTRUCTION WITH PLACEMENT OF TISSUE EXPANDER AND  ALLODERM.;  Surgeon: Irene Limbo, MD;  Location: [facility];  Service: Plastics;  Laterality: Right;  . COLONOSCOPY    . DEBRIDEMENT AND CLOSURE WOUND Right 06/08/2016   Procedure: DEBRIDEMENT RIGHT MASTECTOMY FLAP, TISSUE EXPANSION OF RIGHT CHEST;  Surgeon: Irene Limbo, MD;  Location: [facility];  Service: Plastics;  Laterality: Right;  . NIPPLE  SPARING MASTECTOMY/SENTINAL LYMPH NODE BIOPSY/RECONSTRUCTION/PLACEMENT OF TISSUE EXPANDER Right 05/19/2016   Procedure: RIGHT NIPPLE SPARING MASTECTOMY WITH SENTINAL LYMPH NODE BIOPSY WITH BLUE DYE INJECTION;  Surgeon: Rolm Bookbinder, MD;  Location: [facility];  Service: General;  Laterality: Right;  . PORTACATH PLACEMENT N/A 12/30/2015   Procedure: INSERTION PORT-A-CATH WITH [place];  Surgeon: Rolm Bookbinder, MD;  Location: [facility];  Service: General;  Laterality: N/A;    There were no vitals filed for this visit.       Subjective Assessment - 06/24/16 0944    Subjective Right mastectomy with sentinel node biopsy and expander placed 05/19/16. She has 2 more fills done before she gets her permanent implant.    Pertinent History Right mastectomy with sentinel node biopsy and expander placed on 05/19/16 followed by debridement of necrotic tissue around her nipple 2 weeks after that. She had neoadjuvant chemotherapy until 04/15/17 but is currently getting Herceptin. Her breast cancer was estrogen, progesterone and HER2 positive. Her axillary nodes were negative and she does not require radiation.   Patient Stated Goals Improve shoulder ROM and reduce pain   Currently in Pain? Yes   Pain Score 5    Pain Location Arm   Pain Orientation Right   Pain Descriptors / Indicators Constant;Sore   Pain Type Surgical pain   Pain Onset More than a month ago   Pain Frequency Constant   Aggravating Factors  reaching overhead   Pain Relieving Factors rest   Multiple Pain Sites No            [facility] PT Assessment - 06/24/16 0001      Assessment   Medical Diagnosis s/p right mastectomy with reconstruction   Referring Provider Dr. Irene Limbo   Onset Date/Surgical Date 05/19/16   Hand Dominance Right   Next MD Visit 06/24/16   Prior Therapy n     Precautions   Precautions Other (comment)   Precaution Comments right arm lymphedema risk     Restrictions   Weight  Bearing Restrictions No     Balance Screen   Has the patient fallen in the past 6 months No   Has the patient had a decrease in activity level because of a fear of falling?  No   Is the patient reluctant to leave their home because of a fear of falling?  No     Home Ecologist residence   Living Arrangements Spouse/significant other   Available Help at Discharge Family     Prior Function   Level of [place] Part time employment  [place] Requirements Works remotely    Leisure She has not been exercising since surgery but previously did yoga, walking     Cognition   Overall Cognitive Status Within Functional Limits for tasks assessed     Posture/Postural Control   Posture/Postural Control Postural limitations   Postural Limitations Rounded Shoulders;Forward head     ROM / Strength   AROM / PROM / Strength AROM;Strength     AROM   AROM Assessment Site Shoulder;Cervical   Right/Left Shoulder Right;Left   Right Shoulder Extension 41 Degrees   Right Shoulder Flexion 120 Degrees   Right Shoulder ABduction 115 Degrees   Right Shoulder Internal Rotation 60 Degrees   Right Shoulder External Rotation 80 Degrees   Left Shoulder Extension 65 Degrees   Left Shoulder Flexion 160 Degrees   Left Shoulder ABduction 170 Degrees   Left Shoulder Internal Rotation 85 Degrees   Left Shoulder External Rotation 90 Degrees   Cervical Flexion WNL   Cervical Extension WNL   Cervical - Right Side Bend WNL   Cervical - Left Side Bend WNL   Cervical - Right Rotation WNL   Cervical - Left Rotation WNL     Strength   Overall Strength Unable to assess;Due to pain     Palpation   Palpation comment Incision appears to be healing well around nipple area that was debrided           LYMPHEDEMA/ONCOLOGY QUESTIONNAIRE - 06/24/16 1011      Type   Cancer Type Right breast     Surgeries   Mastectomy Date  05/19/16   Saline Implant Reconstruction Date 05/19/16  Expander placed   Sentinel Lymph Node Biopsy Date 05/19/16   Number Lymph Nodes Removed 1     Treatment   Active Chemotherapy Treatment Yes   Date --  Currently etting Herceptin; neoadjuvant chemo ended 04/15/16   Past Chemotherapy Treatment Yes   Date 04/15/17   Active Radiation Treatment No   Past Radiation Treatment No   Current Hormone Treatment No   Past Hormone Therapy No     What other symptoms do you have   Are you Having Heaviness or Tightness Yes   Are you having Pain Yes   Are you having pitting edema No  Is it Hard or Difficult finding clothes that fit No   Do you have infections No   Is there Decreased scar mobility Yes   [place] Sign No     Lymphedema Assessments   Lymphedema Assessments Upper extremities     Right Upper Extremity Lymphedema   At Axilla  27.7 cm   15 cm Proximal to Olecranon Process 27.1 cm   10 cm Proximal to Olecranon Process 26.5 cm   Olecranon Process 22.7 cm   15 cm Proximal to Ulnar Styloid Process 23.6 cm   10 cm Proximal to Ulnar Styloid Process 22 cm   Just Proximal to Ulnar Styloid Process 15 cm   Across Hand at [employer] 17.6 cm   At [place] of 2nd Digit 6 cm     Left Upper Extremity Lymphedema   At Axilla  27.1 cm   15 cm Proximal to Olecranon Process 26 cm   10 cm Proximal to Olecranon Process 26 cm   Olecranon Process 22.4 cm   15 cm Proximal to Ulnar Styloid Process 22.8 cm   10 cm Proximal to Ulnar Styloid Process 21.2 cm   Just Proximal to Ulnar Styloid Process 14.6 cm   Across Hand at [employer] 16.4 cm   At [place] of 2nd Digit 5.7 cm           Quick Dash - 06/24/16 0001    Open a tight or new jar Severe difficulty   Do heavy household chores (wash walls, wash floors) Severe difficulty   Carry a shopping bag or briefcase Moderate difficulty   Wash your back Severe difficulty   Use a knife to cut food Mild difficulty   Recreational activities in  which you take some force or impact through your arm, shoulder, or hand (golf, hammering, tennis) Unable   During the past week, to what extent has your arm, shoulder or hand problem interfered with your normal social activities with family, friends, neighbors, or groups? Modererately   During the past week, to what extent has your arm, shoulder or hand problem limited your work or other regular daily activities Quite a bit   Arm, shoulder, or hand pain. Moderate   Tingling (pins and needles) in your arm, shoulder, or hand Moderate   Difficulty Sleeping Severe difficulty   DASH Score 63.64 %                     PT Education - 06/24/16 1128    Education provided Yes   Education Details Shoulder ROM HEP   Person(s) Educated Patient   Methods Explanation;Demonstration;Handout   Comprehension Returned demonstration;Verbalized understanding                [place] - 06/24/16 1139      [place] Term Goal  #1   Title Patient will be independent in home exercise program to tolerate daily tasks.   Time 4   Period Weeks   Status New     CC Long Term Goal  #2   Title Increase right shoulder active flexion to >/= 140 degrees for increased ease reaching overhead.   Time 4   Period Weeks   Status New     CC Long Term Goal  #3   Title Increase right shoulder active abduction to >/= 140 degrees for increased ease reaching overhead.   Time 4   Period Weeks   Status New     CC Long  Term Goal  #4   Title Report >/= 50% reduction in right chest and arm pain to return to household tasks.   Time 4   Period Weeks   Status New     CC Long Term Goal  #5   Title Report she is independent with >/= 80% of normal daily tasks including house cleaning and returning to yoga.   Time 4   Period Weeks   Status New            Plan - 06/24/16 1134    Clinical Impression Statement Patient is a pleasant 61 y.o. woman s/p a right mastectomy and sentinel node biopsy  which required nipple debridement for necrotic tissue 2 weeks after her mastectomy. She reports she has not used her right arm and has kept it by her side since surgery but is eager to regain ROM. She reports some of her limitation is due to fear. She will benefit from PT to regain shoulder ROM and strength as she lost > 30 pounds during chemotherapy and became weak. She will also benefit from education on lymphedema risk reduction although she is at very low risk. Due to her lack of comorbidities, her eval is of low complexity.   Rehab Potential Excellent   Clinical Impairments Affecting Rehab Potential None   PT Frequency 3x / week   PT Duration 4 weeks   PT Treatment/Interventions ADLs/Self Care Home Management;Therapeutic activities;Therapeutic exercise;Patient/family education;Passive range of motion;Scar mobilization;Manual techniques   PT Next Visit Plan PROM and shoulder ROM exercises; postural exercises; needs reassurance about where she is in her healing process as she is on track but feels frustrated with her current functional state. ABC class on 07/06/16; already signed up.   PT Home Exercise Plan Shoulder ROM post op exercises   Consulted and Agree with Plan of Care Patient      Patient will benefit from skilled therapeutic intervention in order to improve the following deficits and impairments:  Postural dysfunction, Decreased strength, Impaired flexibility, Decreased knowledge of precautions, Decreased activity tolerance, Decreased skin integrity, Pain, Impaired UE functional use, Decreased range of motion, Increased fascial restricitons  Visit Diagnosis: Stiffness of right shoulder, not elsewhere classified - Plan: PT plan of care cert/re-cert  Pain in right arm - Plan: PT plan of care cert/re-cert  Acute pain of right shoulder - Plan: PT plan of care cert/re-cert  Abnormal posture - Plan: PT plan of care cert/re-cert     Problem List Patient Active Problem List    Diagnosis Date Noted  . Chemotherapy-induced thrombocytopenia 03/30/2016  . Genetic testing 01/10/2016  . Port catheter in place 01/08/2016  . History of sepsis 01/08/2016  . GERD (gastroesophageal reflux disease) 01/08/2016  . Breast cancer of upper-inner quadrant of right female breast ([place]) 12/18/2015  . Family history of breast cancer   . Internal hemorrhoid 01/26/2011   Annia Friendly, PT 06/24/16 11:43 AM  [facility] [place], [place], [address] Phone: [phone]   Fax:  [phone]  Name: MESSINA KOSINSKI MRN: [id number] Date of Birth: [birth year]

## 2016-06-24 NOTE — Patient Instructions (Signed)
Patient was instructed today in a home exercise program today for post op shoulder range of motion. These included active assist shoulder flexion in sitting, scapular retraction, wall walking with shoulder abduction, and hands behind head external rotation.  She was encouraged to do these twice a day, holding 3 seconds and repeating 5 times.    

## 2016-06-29 ENCOUNTER — Ambulatory Visit: Payer: BLUE CROSS/BLUE SHIELD | Admitting: Physical Therapy

## 2016-07-01 ENCOUNTER — Ambulatory Visit (HOSPITAL_BASED_OUTPATIENT_CLINIC_OR_DEPARTMENT_OTHER)
Admission: RE | Admit: 2016-07-01 | Discharge: 2016-07-01 | Disposition: A | Discharge status: Home / Self Care | Payer: BLUE CROSS/BLUE SHIELD | Source: Ambulatory Visit | Attending: Internal Medicine | Admitting: Internal Medicine

## 2016-07-01 ENCOUNTER — Telehealth: Payer: Self-pay | Admitting: Emergency Medicine

## 2016-07-01 ENCOUNTER — Ambulatory Visit: Payer: BLUE CROSS/BLUE SHIELD

## 2016-07-01 ENCOUNTER — Ambulatory Visit (HOSPITAL_COMMUNITY)
Admission: RE | Admit: 2016-07-01 | Discharge: 2016-07-01 | Disposition: A | Discharge status: Home / Self Care | Payer: BLUE CROSS/BLUE SHIELD | Source: Ambulatory Visit | Attending: Family Medicine | Admitting: Family Medicine

## 2016-07-01 ENCOUNTER — Encounter (HOSPITAL_COMMUNITY): Payer: Self-pay | Admitting: Internal Medicine

## 2016-07-01 VITALS — BP 112/70 | HR 67 | Wt 129.0 lb

## 2016-07-01 DIAGNOSIS — M25511 Pain in right shoulder: Secondary | ICD-10-CM

## 2016-07-01 DIAGNOSIS — Z9221 Personal history of antineoplastic chemotherapy: Secondary | ICD-10-CM | POA: Insufficient documentation

## 2016-07-01 DIAGNOSIS — Z9011 Acquired absence of right breast and nipple: Secondary | ICD-10-CM | POA: Diagnosis not present

## 2016-07-01 DIAGNOSIS — Z79899 Other long term (current) drug therapy: Secondary | ICD-10-CM | POA: Diagnosis not present

## 2016-07-01 DIAGNOSIS — Z823 Family history of stroke: Secondary | ICD-10-CM | POA: Diagnosis not present

## 2016-07-01 DIAGNOSIS — T451X5A Adverse effect of antineoplastic and immunosuppressive drugs, initial encounter: Secondary | ICD-10-CM

## 2016-07-01 DIAGNOSIS — I427 Cardiomyopathy due to drug and external agent: Secondary | ICD-10-CM | POA: Diagnosis not present

## 2016-07-01 DIAGNOSIS — Z8249 Family history of ischemic heart disease and other diseases of the circulatory system: Secondary | ICD-10-CM | POA: Diagnosis not present

## 2016-07-01 DIAGNOSIS — Z803 Family history of malignant neoplasm of breast: Secondary | ICD-10-CM | POA: Insufficient documentation

## 2016-07-01 DIAGNOSIS — Z170 Estrogen receptor positive status [ER+]: Secondary | ICD-10-CM | POA: Insufficient documentation

## 2016-07-01 DIAGNOSIS — D6959 Other secondary thrombocytopenia: Secondary | ICD-10-CM

## 2016-07-01 DIAGNOSIS — I340 Nonrheumatic mitral (valve) insufficiency: Secondary | ICD-10-CM | POA: Diagnosis not present

## 2016-07-01 DIAGNOSIS — M25611 Stiffness of right shoulder, not elsewhere classified: Secondary | ICD-10-CM

## 2016-07-01 DIAGNOSIS — I429 Cardiomyopathy, unspecified: Secondary | ICD-10-CM | POA: Diagnosis not present

## 2016-07-01 DIAGNOSIS — M79601 Pain in right arm: Secondary | ICD-10-CM

## 2016-07-01 DIAGNOSIS — C50211 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 female breast: Secondary | ICD-10-CM | POA: Diagnosis present

## 2016-07-01 DIAGNOSIS — N189 Chronic kidney disease, unspecified: Secondary | ICD-10-CM | POA: Insufficient documentation

## 2016-07-01 DIAGNOSIS — R293 Abnormal posture: Secondary | ICD-10-CM

## 2016-07-01 MED ORDER — LOSARTAN POTASSIUM 25 MG PO TABS: 12.5000 mg | ORAL_TABLET | Freq: Every day | ORAL | 3 refills | Status: DC

## 2016-07-01 MED FILL — LOSARTAN POTASSIUM 25 MG TA: 25 | 30 days supply | Qty: 15 | Fill #0

## 2016-07-01 NOTE — Progress Notes (Signed)
CARDIO-ONCOLOGY CLINIC NOTE  Patient Care Team: [name], MD as PCP - General (Family Medicine)  Referring oncologist: Lindi Adie   HISTORY OF PRESENTING ILLNESS:  Patricia Chandler is a 61 y/o woman with no cardiac history undergoing treatment for right breast cancer. Referred by Dr. Lindi Adie for enrollment into the [facility].   SUMMARY OF ONCOLOGIC HISTORY:   Breast cancer of upper-inner quadrant of right female breast ([facility])   12/05/2015 Mammogram    Right breast mass 2:00 4 cm from nipple: 1.7 cm, T1c N0 stage IA; right breast 2:00 6 cm from nipple 1.3 cm mass, 7 mm satellite nodule between the 2 masses, T1 cN0 stage IA      12/06/2015 Initial Diagnosis    Right breast biopsy 2:00 6 cm from nipple: Grade 2-3 invasive ductal cancer, ER 90%, PR 2%, Ki-67 80%, HER-2 positive ratio 2.07; right biopsy 2:00 4 cm from nipple: IDC with DCIS, ER 95%, PR 10%, Ki-67 90%, HER-2 negative ratio 1.27      12/18/2015 Breast MRI    3 enhancing masses in the upper inner quadrant right breast spanning an area 3.7 cm (1.7 cm, 1 cm, 1.4 cm) no lymph node enlargement      12/31/2015 - 04/15/2016 Neo-Adjuvant Chemotherapy    TCH Perjeta 6 cycles followed by Herceptin, Perjeta maintenance for 1 year      01/08/2016 - 01/12/2016 Hospital Admission    Neutropenic fever hospitalization (because patient did not receive Neulasta with cycle 1)      01/10/2016 Miscellaneous    Genetic testing was normal did not reveal any mutations      04/16/2016 Breast MRI    Near CR to therapy. Previous 2 biopsied massses are no longer seen. Third satellite lesion is smaller from 10 mm to 5.3 mm, no abnormal LN.      05/19/2016 Surgery    Right mastectomy: IDC grade 3, 1.3 cm, low-grade DCIS, LVIDS present, margins negative, 0/1 lymph node negative, ypT1CypN0 stage IA; repeat HER-2 negative ratio 1.41      05/19/2016 Surgery    Right breast reconstruction with tissue expander. Acellular dermis for  breast reconstruction (Dr.Thimappa)       Subjective   Finished chemo on 04/15/16. Has been getting herceptin and perjeta. S/p mastectomy with reconstruction in 1/18. Tolerating well. Starting to return to exercise. No SOB or edema.   Echo (12/26/15) EF 60-65% GLS -22.5% Lateral s' 11.4 cm/s.  Echo 04/02/16 EF 60-65% GLS - 21.3% Lateral s' 10.8 cm/s (reviewed personally) Echo 07/01/16 EF 50-55% LS 9.4 cm/s GLS -19.2%  MEDICAL HISTORY:  Past Medical History:  Diagnosis Date  . Anxiety   . Breast cancer ([place])   . Chronic kidney disease   . Complication of anesthesia 12/2015   gastric content to come in LMA, needed intubation, had severe laryngitis- lasted loner than 1 month  . Diverticulosis   . Family history of adverse reaction to anesthesia    daughter - nausea  . Family history of breast cancer   . Genital herpes   . GERD (gastroesophageal reflux disease)   . Heart murmur    had ECHO 12 yrs ago was neg  . Internal hemorrhoid   . Pneumonia    3 times- viral last time approx 2014    SURGICAL HISTORY: Past Surgical History:  Procedure Laterality Date  . BREAST ENHANCEMENT SURGERY  2005  . BREAST LUMPECTOMY  1973   right/BENIGN  . BREAST RECONSTRUCTION WITH PLACEMENT OF TISSUE  EXPANDER AND FLEX HD (ACELLULAR HYDRATED DERMIS) Right 05/19/2016   Procedure: RIGHT BREAST RECONSTRUCTION WITH PLACEMENT OF TISSUE EXPANDER AND  ALLODERM.;  Surgeon: Irene Limbo, MD;  Location: [facility];  Service: Plastics;  Laterality: Right;  . COLONOSCOPY    . DEBRIDEMENT AND CLOSURE WOUND Right 06/08/2016   Procedure: DEBRIDEMENT RIGHT MASTECTOMY FLAP, TISSUE EXPANSION OF RIGHT CHEST;  Surgeon: Irene Limbo, MD;  Location: [facility];  Service: Plastics;  Laterality: Right;  . NIPPLE SPARING MASTECTOMY/SENTINAL LYMPH NODE BIOPSY/RECONSTRUCTION/PLACEMENT OF TISSUE EXPANDER Right 05/19/2016   Procedure: RIGHT NIPPLE SPARING MASTECTOMY WITH SENTINAL LYMPH NODE BIOPSY WITH BLUE DYE  INJECTION;  Surgeon: Rolm Bookbinder, MD;  Location: [facility];  Service: General;  Laterality: Right;  . PORTACATH PLACEMENT N/A 12/30/2015   Procedure: INSERTION PORT-A-CATH WITH [place];  Surgeon: Rolm Bookbinder, MD;  Location: [facility];  Service: General;  Laterality: N/A;    SOCIAL HISTORY: Social History   Social History  . Marital status: Married    Spouse name: N/A  . Number of children: 2  . Years of education: N/A   Occupational History  . medical receptionist    Social History Main Topics  . Smoking status: Never Smoker  . Smokeless tobacco: Never Used  . Alcohol use Yes     Comment: social  . Drug use: No  . Sexual activity: Not on file   Other Topics Concern  . Not on file   Social History Narrative   Married 1978 (Al). 2 girls 33 and 36 in 2018. [name] lives in [place], [place] with 11 yo son (single right now). and [name] in [place], [place]--> now [place] with husband and 74 year old daughter (born with heart defect with missing valve and initially not closed VSD--still weighs heavily on family- likely will have to be repaired).       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    Graduated from [employer]. [employer] prior.       Hobbies: gardening, decorating, sewing, enjoys creating    FAMILY HISTORY: Family History  Problem Relation Age of Onset  . Breast cancer Maternal Aunt     dx in her 50s  . Stroke Father     late 45s  . Hypertension Father   . Pulmonary fibrosis Mother   . Healthy Brother   . Breast cancer Paternal Aunt 78  . Cancer Paternal Grandmother     abdominal; cervical vs uterine dx in her 63s  . Parkinson's disease Maternal Grandfather   . Other Brother     accident    ALLERGIES:  is allergic to no known allergies.  MEDICATIONS:  Current Outpatient Prescriptions  Medication Sig Dispense Refill  . acetaminophen (TYLENOL)  500 MG tablet Take 500 mg by mouth every 6 (six) hours as needed.    . ALPRAZolam (XANAX) 0.5 MG tablet TAKE 1 TABLET BY MOUTH AT BEDTIME AS NEEDED FOR ANXIETY 60 tablet 0  . Ascorbic Acid (VITAMIN C) 1000 MG tablet Take 1,000 mg by mouth daily.    . calcium carbonate (OS-CAL) 600 MG TABS tablet Take 600 mg by mouth daily.    . Cholecalciferol (VITAMIN D3) 1000 units CAPS Take 4,000 Units by mouth daily.    Marland Kitchen ibuprofen (ADVIL,MOTRIN) 200 MG tablet Take 400-600 mg by mouth every 6 (six) hours as needed for moderate pain.    Marland Kitchen lidocaine-prilocaine (EMLA) cream Apply  1 application topically daily as needed (port access).     Marland Kitchen loperamide (IMODIUM) 2 MG capsule Take 2 mg by mouth as needed for diarrhea or loose stools.    . magnesium 30 MG tablet Take 60 mg by mouth daily.     . Multiple Vitamin (MULTIVITAMIN WITH MINERALS) TABS tablet Take 1 tablet by mouth daily.    . pantoprazole (PROTONIX) 40 MG tablet Take 40 mg by mouth 2 (two) times daily.   0  . Probiotic Product (PROBIOTIC DAILY PO) Take 1-2 capsules by mouth daily.     Marland Kitchen zolpidem (AMBIEN) 5 MG tablet Take 1 tablet (5 mg total) by mouth at bedtime as needed for sleep. 30 tablet 1  . letrozole (FEMARA) 2.5 MG tablet Take 1 tablet (2.5 mg total) by mouth daily. (Patient not taking: Reported on 06/24/2016) 90 tablet 3   No current facility-administered medications for this encounter.     REVIEW OF SYSTEMS:   Constitutional: Denies fevers, chills or abnormal night sweats Eyes: Denies blurriness of vision, double vision or watery eyes Ears, nose, mouth, throat, and face: Denies mucositis or sore throat Respiratory: Denies cough, dyspnea or wheezes Cardiovascular: Denies palpitation, chest discomfort or lower extremity swelling Gastrointestinal:  Denies nausea, heartburn or change in bowel habits Skin: Denies abnormal skin rashes Lymphatics: Denies new lymphadenopathy or easy bruising Neurological:Denies numbness, tingling or new  weaknesses Behavioral/Psych: Mood is stable, no new changes  Breast:  Denies any palpable lumps or discharge All other systems were reviewed with the patient and are negative.    Vitals:   07/01/16 1415  BP: 112/70  Pulse: 67   Filed Weights   07/01/16 1415  Weight: 129 lb (58.5 kg)    General:  Looks good. HEENT: normal + alopecia Neck: supple. RIJ portno JVD. Carotids 2+ bilat; no bruits. No lymphadenopathy or thryomegaly appreciated. Cor: PMI nondisplaced. Regular rate & rhythm. No rubs, gallops or murmurs. Lungs: clear Abdomen: soft, nontender, nondistended. No hepatosplenomegaly. No bruits or masses. Good bowel sounds. Extremities: no cyanosis, clubbing, rash, edema Neuro: alert & orientedx3, cranial nerves grossly intact. moves all 4 extremities w/o difficulty. Affect pleasant   LABORATORY DATA:  I have reviewed the data as listed Lab Results  Component Value Date   WBC 3.8 (L) 06/16/2016   HGB 9.1 (L) 06/16/2016   HCT 27.3 (L) 06/16/2016   MCV 96.1 06/16/2016   PLT 223 06/16/2016   Lab Results  Component Value Date   NA 136 06/16/2016   K 4.2 06/16/2016   CL 111 01/12/2016   CO2 23 06/16/2016     ASSESSMENT AND PLAN:   1. Breast cancer of upper-inner quadrant of right female breast ([facility]) -- ER 90%, PR 2%, Ki-67 80%, HER-2 positive ratio 2.07;  -- Clinical stage: T2N0 (stage 2A) -- Now s/p neoadjuvant chemotherapy and mastectomy with reconstruction.  -- Getting Joyce Perjeta -- I reviewed echos personally. There is mild cardiomyopathy likely due to HErceptin with EF 50-55%. Will hold Heceptin for 6 weeks. I will see back with echo in 2 months. If EF normal at that time will restart. Start losartan 12.5 qhs   Bensimhon, Daniel,MD 2:23 PM

## 2016-07-01 NOTE — Telephone Encounter (Signed)
Per Dr Lindi Adie; canceled patient's appointments for 3/20; advised patient that we will reschedule once her Echo in May is completed and she has been cleared per cardiology to restart Herceptin.

## 2016-07-01 NOTE — Therapy (Signed)
[place], [place], [address] Phone: [phone]   Fax:  [phone]  Physical Therapy Treatment  Patient Details  Name: Patricia Chandler MRN: [id number] Date of Birth: [birth year] Referring Provider: Dr. Irene Limbo  Encounter Date: 07/01/2016      PT End of Session - 07/01/16 1236    Visit Number 2   Number of Visits 12   Date for PT Re-Evaluation 07/22/16   PT Start Time 1146   PT Stop Time 1231   PT Time Calculation (min) 45 min   Activity Tolerance Patient tolerated treatment well   Behavior During Therapy [facility] for tasks assessed/performed      Past Medical History:  Diagnosis Date  . Anxiety   . Breast cancer ([facility])   . Chronic kidney disease   . Complication of anesthesia 12/2015   gastric content to come in LMA, needed intubation, had severe laryngitis- lasted loner than 1 month  . Diverticulosis   . Family history of adverse reaction to anesthesia    daughter - nausea  . Family history of breast cancer   . Genital herpes   . GERD (gastroesophageal reflux disease)   . Heart murmur    had ECHO 12 yrs ago was neg  . Internal hemorrhoid   . Pneumonia    3 times- viral last time approx 2014    Past Surgical History:  Procedure Laterality Date  . BREAST ENHANCEMENT SURGERY  2005  . BREAST LUMPECTOMY  1973   right/BENIGN  . BREAST RECONSTRUCTION WITH PLACEMENT OF TISSUE EXPANDER AND FLEX HD (ACELLULAR HYDRATED DERMIS) Right 05/19/2016   Procedure: RIGHT BREAST RECONSTRUCTION WITH PLACEMENT OF TISSUE EXPANDER AND  ALLODERM.;  Surgeon: Irene Limbo, MD;  Location: [facility];  Service: Plastics;  Laterality: Right;  . COLONOSCOPY    . DEBRIDEMENT AND CLOSURE WOUND Right 06/08/2016   Procedure: DEBRIDEMENT RIGHT MASTECTOMY FLAP, TISSUE EXPANSION OF RIGHT CHEST;  Surgeon: Irene Limbo, MD;  Location: [facility];  Service: Plastics;  Laterality: Right;  . NIPPLE  SPARING MASTECTOMY/SENTINAL LYMPH NODE BIOPSY/RECONSTRUCTION/PLACEMENT OF TISSUE EXPANDER Right 05/19/2016   Procedure: RIGHT NIPPLE SPARING MASTECTOMY WITH SENTINAL LYMPH NODE BIOPSY WITH BLUE DYE INJECTION;  Surgeon: Rolm Bookbinder, MD;  Location: [facility];  Service: General;  Laterality: Right;  . PORTACATH PLACEMENT N/A 12/30/2015   Procedure: INSERTION PORT-A-CATH WITH [place];  Surgeon: Rolm Bookbinder, MD;  Location: [facility];  Service: General;  Laterality: N/A;    There were no vitals filed for this visit.      Subjective Assessment - 07/01/16 1149    Subjective Doing okay, just keep having soreness at my Rt chest and in the axilla. But Dr.Wakefield assures me this is normal. I have been doing the exercises from my first visit here and I think the tightness has gotten better in my shoulder. I have been using my arm more thanks to talking to [facility] last week. I needed to hear her encouraging me that I wasn't going to mess up anything from the surgery if I start using my arm again.    Pertinent History Right mastectomy with sentinel node biopsy and expander placed on 05/19/16 followed by debridement of necrotic tissue around her nipple 2 weeks after that. She had neoadjuvant chemotherapy until 04/15/17 but is currently getting Herceptin. Her breast cancer was estrogen, progesterone and HER2 positive. Her axillary nodes were negative and she does not require radiation.   Patient Stated Goals  Improve shoulder ROM and reduce pain   Currently in Pain? No/denies            [facility] PT Assessment - 07/01/16 0001      AROM   Right Shoulder Flexion 137 Degrees   Right Shoulder ABduction 149 Degrees                     [facility] Adult PT Treatment/Exercise - 07/01/16 0001      Shoulder Exercises: Pulleys   Flexion 2 minutes   Flexion Limitations Pt returned correct therapist demonstration   ABduction 2 minutes   ABduction Limitations Pt returned  correct therapist demonstration but required VC to keep arm out to side     Shoulder Exercises: Therapy Ball   Flexion 10 reps  With forward lean into top of stretch     Shoulder Exercises: ROM/Strengthening   Other ROM/Strengthening Exercises Finger Ladder to Rt UE into abduction 5 times     Manual Therapy   Passive ROM In Supine to Rt shoulder into flexion, abduction, er, and D2 all slowly and to pts tolerance.                 PT Education - 07/01/16 1240    Education provided Yes   Education Details Continued to encourage pt througout session that she is healing well overall and, even though she is still having some pain, it's important to remember this pain has lessened over the weeks as she conts to heal. Also encouraged her to cont using her UE with ADLs letting pain be her guide.    Person(s) Educated Patient   Methods Explanation   Comprehension Verbalized understanding                [facility] Goals - 06/24/16 1139      CC Long Term Goal  #1   Title Patient will be independent in home exercise program to tolerate daily tasks.   Time 4   Period Weeks   Status New     CC Long Term Goal  #2   Title Increase right shoulder active flexion to >/= 140 degrees for increased ease reaching overhead.   Time 4   Period Weeks   Status New     CC Long Term Goal  #3   Title Increase right shoulder active abduction to >/= 140 degrees for increased ease reaching overhead.   Time 4   Period Weeks   Status New     CC Long Term Goal  #4   Title Report >/= 50% reduction in right chest and arm pain to return to household tasks.   Time 4   Period Weeks   Status New     CC Long Term Goal  #5   Title Report she is independent with >/= 80% of normal daily tasks including house cleaning and returning to yoga.   Time 4   Period Weeks   Status New            Plan - 07/01/16 1237    Clinical Impression Statement Pt has made excellent gains already with  being consistent with her HEP and just getting comfortable with using her Rt UE more with ADLs but still being conscious of pain. She tolerated all AA/ROM exercises well today reporting no pain with these. Also tolerated P/ROM well, though was initially nervous about being stretched, she relaxed well throughout session and reported feeling good by end of session.  Rehab Potential Excellent   Clinical Impairments Affecting Rehab Potential None   PT Frequency 3x / week   PT Duration 4 weeks   PT Treatment/Interventions ADLs/Self Care Home Management;Therapeutic activities;Therapeutic exercise;Patient/family education;Passive range of motion;Scar mobilization;Manual techniques   PT Next Visit Plan Cont PROM and shoulder ROM exercises; postural exercises-may be ready for supine scapular series with yellow theraband next session; needs reassurance about where she is in her healing process though she reports she is slowly getting/feeling better with this. ABC class on 07/06/16; already signed up.   PT Home Exercise Plan Shoulder ROM post op exercises   Consulted and Agree with Plan of Care Patient      Patient will benefit from skilled therapeutic intervention in order to improve the following deficits and impairments:  Postural dysfunction, Decreased strength, Impaired flexibility, Decreased knowledge of precautions, Decreased activity tolerance, Decreased skin integrity, Pain, Impaired UE functional use, Decreased range of motion, Increased fascial restricitons  Visit Diagnosis: Stiffness of right shoulder, not elsewhere classified  Pain in right arm  Acute pain of right shoulder  Abnormal posture     Problem List Patient Active Problem List   Diagnosis Date Noted  . Chemotherapy-induced thrombocytopenia 03/30/2016  . Genetic testing 01/10/2016  . Port catheter in place 01/08/2016  . History of sepsis 01/08/2016  . GERD (gastroesophageal reflux disease) 01/08/2016  . Breast cancer of  upper-inner quadrant of right female breast ([facility]) 12/18/2015  . Family history of breast cancer   . Internal hemorrhoid 01/26/2011    Otelia Limes, PTA 07/01/2016, 12:45 PM  [place], [place], [address] Phone: [phone]   Fax:  [phone]  Name: Patricia Chandler MRN: [id number] Date of Birth: 1956-01-01

## 2016-07-01 NOTE — Progress Notes (Signed)
Echocardiogram 2D Echocardiogram has been performed.  Patricia Chandler 07/01/2016, 1:49 PM

## 2016-07-01 NOTE — Addendum Note (Signed)
Encounter addended by: Kennieth Rad, RN on: 07/01/2016  3:05 PM<BR>    Actions taken: Visit diagnoses modified, Order list changed, Diagnosis association updated, Sign clinical note

## 2016-07-01 NOTE — Patient Instructions (Signed)
START taking Losartan 12.5 mg (0.5 Tablet) Once Daily  Echo and follow up in 6-8 weeks

## 2016-07-03 ENCOUNTER — Ambulatory Visit: Payer: BLUE CROSS/BLUE SHIELD | Admitting: Physical Therapy

## 2016-07-03 ENCOUNTER — Encounter: Payer: Self-pay | Admitting: Physical Therapy

## 2016-07-03 DIAGNOSIS — M25611 Stiffness of right shoulder, not elsewhere classified: Secondary | ICD-10-CM

## 2016-07-03 DIAGNOSIS — M79601 Pain in right arm: Secondary | ICD-10-CM

## 2016-07-03 NOTE — Therapy (Signed)
[place], [place], [address] Phone: [phone]   Fax:  [phone]  Physical Therapy Treatment  Patient Details  Name: Patricia Chandler MRN: [id number] Date of Birth: [birth year] Referring Provider: Dr. Irene Limbo  Encounter Date: 07/03/2016      PT End of Session - 07/03/16 1149    Visit Number 3   Number of Visits 12   Date for PT Re-Evaluation 07/22/16   PT Start Time 1103   PT Stop Time 1145   PT Time Calculation (min) 42 min   Activity Tolerance Patient tolerated treatment well   Behavior During Therapy [facility] for tasks assessed/performed      Past Medical History:  Diagnosis Date  . Anxiety   . Breast cancer ([place])   . Chronic kidney disease   . Complication of anesthesia 12/2015   gastric content to come in LMA, needed intubation, had severe laryngitis- lasted loner than 1 month  . Diverticulosis   . Family history of adverse reaction to anesthesia    daughter - nausea  . Family history of breast cancer   . Genital herpes   . GERD (gastroesophageal reflux disease)   . Heart murmur    had ECHO 12 yrs ago was neg  . Internal hemorrhoid   . Pneumonia    3 times- viral last time approx 2014    Past Surgical History:  Procedure Laterality Date  . BREAST ENHANCEMENT SURGERY  2005  . BREAST LUMPECTOMY  1973   right/BENIGN  . BREAST RECONSTRUCTION WITH PLACEMENT OF TISSUE EXPANDER AND FLEX HD (ACELLULAR HYDRATED DERMIS) Right 05/19/2016   Procedure: RIGHT BREAST RECONSTRUCTION WITH PLACEMENT OF TISSUE EXPANDER AND  ALLODERM.;  Surgeon: Irene Limbo, MD;  Location: [facility];  Service: Plastics;  Laterality: Right;  . COLONOSCOPY    . DEBRIDEMENT AND CLOSURE WOUND Right 06/08/2016   Procedure: DEBRIDEMENT RIGHT MASTECTOMY FLAP, TISSUE EXPANSION OF RIGHT CHEST;  Surgeon: Irene Limbo, MD;  Location: [facility];  Service: Plastics;  Laterality: Right;  . NIPPLE  SPARING MASTECTOMY/SENTINAL LYMPH NODE BIOPSY/RECONSTRUCTION/PLACEMENT OF TISSUE EXPANDER Right 05/19/2016   Procedure: RIGHT NIPPLE SPARING MASTECTOMY WITH SENTINAL LYMPH NODE BIOPSY WITH BLUE DYE INJECTION;  Surgeon: Rolm Bookbinder, MD;  Location: [facility];  Service: General;  Laterality: Right;  . PORTACATH PLACEMENT N/A 12/30/2015   Procedure: INSERTION PORT-A-CATH WITH [place];  Surgeon: Rolm Bookbinder, MD;  Location: [facility];  Service: General;  Laterality: N/A;    There were no vitals filed for this visit.      Subjective Assessment - 07/03/16 1106    Subjective I started Losartan because the herceptin is affecting my heart. I felt weird this morning like a little light headed. I have to start my anti estrogen soon too.    Pertinent History Right mastectomy with sentinel node biopsy and expander placed on 05/19/16 followed by debridement of necrotic tissue around her nipple 2 weeks after that. She had neoadjuvant chemotherapy until 04/15/17 but is currently getting Herceptin. Her breast cancer was estrogen, progesterone and HER2 positive. Her axillary nodes were negative and she does not require radiation.   Patient Stated Goals Improve shoulder ROM and reduce pain   Currently in Pain? No/denies   Pain Score 0-No pain                         [facility] Adult PT Treatment/Exercise - 07/03/16 0001  Shoulder Exercises: Pulleys   Flexion 2 minutes   ABduction 2 minutes     Shoulder Exercises: Therapy Ball   Flexion 10 reps  With forward lean into top of stretch   ABduction 10 reps  on right     Shoulder Exercises: ROM/Strengthening   Other ROM/Strengthening Exercises Finger Ladder to Rt UE into abduction 5 times     Manual Therapy   Passive ROM In Supine to Rt shoulder into flexion, abduction, er, and D2 all slowly and to pts tolerance.                         [facility] Goals - 06/24/16 1139      [place] Term Goal  #1   Title Patient will be independent in home exercise program to tolerate daily tasks.   Time 4   Period Weeks   Status New     CC Long Term Goal  #2   Title Increase right shoulder active flexion to >/= 140 degrees for increased ease reaching overhead.   Time 4   Period Weeks   Status New     CC Long Term Goal  #3   Title Increase right shoulder active abduction to >/= 140 degrees for increased ease reaching overhead.   Time 4   Period Weeks   Status New     CC Long Term Goal  #4   Title Report >/= 50% reduction in right chest and arm pain to return to household tasks.   Time 4   Period Weeks   Status New     CC Long Term Goal  #5   Title Report she is independent with >/= 80% of normal daily tasks including house cleaning and returning to yoga.   Time 4   Period Weeks   Status New            Plan - 07/03/16 1149    Clinical Impression Statement Patient did very well with PROM today and her PROM improved greatly by end of session. Pt was able to stay relaxed with no verbal cues. Continued with AAROM prior to PROM.    Rehab Potential Excellent   Clinical Impairments Affecting Rehab Potential None   PT Frequency 3x / week   PT Duration 4 weeks   PT Treatment/Interventions ADLs/Self Care Home Management;Therapeutic activities;Therapeutic exercise;Patient/family education;Passive range of motion;Scar mobilization;Manual techniques   PT Next Visit Plan Assess goals and measure ROM, Cont PROM and shoulder ROM exercises; begin supine scapular series with yellow theraband next session; ABC class on 07/06/16; already signed up.   PT Home Exercise Plan Shoulder ROM post op exercises   Consulted and Agree with Plan of Care Patient      Patient will benefit from skilled therapeutic intervention in order to improve the following deficits and impairments:  Postural dysfunction, Decreased strength, Impaired flexibility, Decreased knowledge of precautions, Decreased  activity tolerance, Decreased skin integrity, Pain, Impaired UE functional use, Decreased range of motion, Increased fascial restricitons  Visit Diagnosis: Stiffness of right shoulder, not elsewhere classified  Pain in right arm     Problem List Patient Active Problem List   Diagnosis Date Noted  . Chemotherapy-induced thrombocytopenia 03/30/2016  . Genetic testing 01/10/2016  . Port catheter in place 01/08/2016  . History of sepsis 01/08/2016  . GERD (gastroesophageal reflux disease) 01/08/2016  . Breast cancer of upper-inner quadrant of right female breast ([place]) 12/18/2015  . Family history of  breast cancer   . Internal hemorrhoid 01/26/2011    Allyson Sabal [facility] 07/03/2016, 11:51 AM  [place], [place], [address] Phone: [phone]   Fax:  [phone]  Name: Patricia Chandler MRN: [id number] Date of Birth: 1955/09/26  Manus Gunning, PT 07/03/16 11:58 AM

## 2016-07-06 ENCOUNTER — Ambulatory Visit: Payer: BLUE CROSS/BLUE SHIELD | Admitting: Physical Therapy

## 2016-07-06 DIAGNOSIS — M79601 Pain in right arm: Secondary | ICD-10-CM

## 2016-07-06 DIAGNOSIS — R293 Abnormal posture: Secondary | ICD-10-CM

## 2016-07-06 DIAGNOSIS — M25611 Stiffness of right shoulder, not elsewhere classified: Secondary | ICD-10-CM | POA: Diagnosis not present

## 2016-07-06 DIAGNOSIS — M25511 Pain in right shoulder: Secondary | ICD-10-CM

## 2016-07-06 NOTE — Therapy (Signed)
[place], [place], [address] Phone: [phone]   Fax:  [phone]  Physical Therapy Treatment  Patient Details  Name: Patricia Chandler MRN: [id number] Date of Birth: 06-Feb-1956 Referring Provider: Dr. Irene Limbo  Encounter Date: 07/06/2016      PT End of Session - 07/06/16 1526    Visit Number 4   Number of Visits 12   Date for PT Re-Evaluation 07/22/16   PT Start Time 1594   PT Stop Time 1521   PT Time Calculation (min) 43 min   Activity Tolerance Patient tolerated treatment well   Behavior During Therapy [facility] for tasks assessed/performed      Past Medical History:  Diagnosis Date  . Anxiety   . Breast cancer ([place])   . Chronic kidney disease   . Complication of anesthesia 12/2015   gastric content to come in LMA, needed intubation, had severe laryngitis- lasted loner than 1 month  . Diverticulosis   . Family history of adverse reaction to anesthesia    daughter - nausea  . Family history of breast cancer   . Genital herpes   . GERD (gastroesophageal reflux disease)   . Heart murmur    had ECHO 12 yrs ago was neg  . Internal hemorrhoid   . Pneumonia    3 times- viral last time approx 2014    Past Surgical History:  Procedure Laterality Date  . BREAST ENHANCEMENT SURGERY  2005  . BREAST LUMPECTOMY  1973   right/BENIGN  . BREAST RECONSTRUCTION WITH PLACEMENT OF TISSUE EXPANDER AND FLEX HD (ACELLULAR HYDRATED DERMIS) Right 05/19/2016   Procedure: RIGHT BREAST RECONSTRUCTION WITH PLACEMENT OF TISSUE EXPANDER AND  ALLODERM.;  Surgeon: Irene Limbo, MD;  Location: [facility];  Service: Plastics;  Laterality: Right;  . COLONOSCOPY    . DEBRIDEMENT AND CLOSURE WOUND Right 06/08/2016   Procedure: DEBRIDEMENT RIGHT MASTECTOMY FLAP, TISSUE EXPANSION OF RIGHT CHEST;  Surgeon: Irene Limbo, MD;  Location: [facility];  Service: Plastics;  Laterality: Right;  . NIPPLE  SPARING MASTECTOMY/SENTINAL LYMPH NODE BIOPSY/RECONSTRUCTION/PLACEMENT OF TISSUE EXPANDER Right 05/19/2016   Procedure: RIGHT NIPPLE SPARING MASTECTOMY WITH SENTINAL LYMPH NODE BIOPSY WITH BLUE DYE INJECTION;  Surgeon: Rolm Bookbinder, MD;  Location: [facility];  Service: General;  Laterality: Right;  . PORTACATH PLACEMENT N/A 12/30/2015   Procedure: INSERTION PORT-A-CATH WITH [place];  Surgeon: Rolm Bookbinder, MD;  Location: [facility];  Service: General;  Laterality: N/A;    There were no vitals filed for this visit.      Subjective Assessment - 07/06/16 1440    Subjective Started the femara also.   Currently in Pain? No/denies            [facility] PT Assessment - 07/06/16 0001      AROM   Right Shoulder Flexion 136 Degrees   Right Shoulder ABduction 150 Degrees                     [facility] Adult PT Treatment/Exercise - 07/06/16 0001      Shoulder Exercises: Supine   Horizontal ABduction Strengthening;Both;5 reps;Theraband   Theraband Level (Shoulder Horizontal ABduction) Level 1 (Yellow)   External Rotation Strengthening;Both;10 reps;Theraband   Theraband Level (Shoulder External Rotation) Level 1 (Yellow)   Flexion Strengthening;Both;5 reps;AROM  vs. yellow Theraband, wide and narrow grips   Other Supine Exercises active D2 vs. yellow Theraband x 10 each side, right and left  Manual Therapy   Manual Therapy Myofascial release;Scapular mobilization   Myofascial Release right UE myofascial pulling with movement into abduction   Scapular Mobilization in left sidelying, right scapula movement into protraction at various angles, and into depression, with gentle rocking motions   Passive ROM In Supine to Rt shoulder into flexion, abduction, and er, all slowly and to pts tolerance.                 PT Education - 07/06/16 1459    Education provided Yes   Education Details supine scapular series vs. yellow Theraband   Person(s)  Educated Patient   Methods Explanation;Demonstration;Verbal cues;Handout   Comprehension Verbalized understanding;Returned demonstration                [facility] Goals - 07/06/16 1440      CC Long Term Goal  #1   Title Patient will be independent in home exercise program to tolerate daily tasks.   Status Partially Met     CC Long Term Goal  #2   Title Increase right shoulder active flexion to >/= 140 degrees for increased ease reaching overhead.   Baseline 136 as of 07/06/16.   Status Partially Met     CC Long Term Goal  #3   Title Increase right shoulder active abduction to >/= 140 degrees for increased ease reaching overhead.   Status Achieved     CC Long Term Goal  #4   Title Report >/= 50% reduction in right chest and arm pain to return to household tasks.   Baseline 85% improved as of 07/06/16   Status Achieved     CC Long Term Goal  #5   Title Report she is independent with >/= 80% of normal daily tasks including house cleaning and returning to yoga.   Baseline As of 07/06/16, is doing 100% of household tasks, but hasn't gone back to yoga yet.   Status Partially Met            Plan - 07/06/16 1526    Clinical Impression Statement Patient's right shoulder active flexion and abduction measurements were about the same today as when measured five days ago.  She attended ABC class today so got additional stretches there; she was instructed in supine scapular series strengthening and yellow Theraband was issued.  She has met or partially met some goals but not all.   Rehab Potential Excellent   Clinical Impairments Affecting Rehab Potential None   PT Frequency 3x / week   PT Duration 4 weeks   PT Treatment/Interventions ADLs/Self Care Home Management;Therapeutic activities;Therapeutic exercise;Patient/family education;Passive range of motion;Scar mobilization;Manual techniques   PT Next Visit Plan Cont PROM and shoulder ROM exercises; review supine scapular  series with yellow theraband prn next session;   PT Home Exercise Plan Shoulder ROM post op exercises, supine scapular strengthening   Consulted and Agree with Plan of Care Patient      Patient will benefit from skilled therapeutic intervention in order to improve the following deficits and impairments:  Postural dysfunction, Decreased strength, Impaired flexibility, Decreased knowledge of precautions, Decreased activity tolerance, Decreased skin integrity, Pain, Impaired UE functional use, Decreased range of motion, Increased fascial restricitons  Visit Diagnosis: Stiffness of right shoulder, not elsewhere classified  Pain in right arm  Acute pain of right shoulder  Abnormal posture     Problem List Patient Active Problem List   Diagnosis Date Noted  . Chemotherapy-induced thrombocytopenia 03/30/2016  . Genetic testing 01/10/2016  .  Port catheter in place 01/08/2016  . History of sepsis 01/08/2016  . GERD (gastroesophageal reflux disease) 01/08/2016  . Breast cancer of upper-inner quadrant of right female breast ([place]) 12/18/2015  . Family history of breast cancer   . Internal hemorrhoid 01/26/2011    Kimberle Stanfill 07/06/2016, 3:30 PM  [place], [place], [address] Phone: [phone]   Fax:  [phone]  Name: GEORGIANNA BAND MRN: [id number] Date of Birth: 05/09/1955  Serafina Royals, PT 07/06/16 3:30 PM

## 2016-07-06 NOTE — Patient Instructions (Signed)

## 2016-07-07 ENCOUNTER — Other Ambulatory Visit: Payer: BLUE CROSS/BLUE SHIELD

## 2016-07-07 ENCOUNTER — Ambulatory Visit: Payer: BLUE CROSS/BLUE SHIELD

## 2016-07-07 ENCOUNTER — Ambulatory Visit: Payer: BLUE CROSS/BLUE SHIELD | Admitting: Hematology and Oncology

## 2016-07-09 ENCOUNTER — Ambulatory Visit: Payer: BLUE CROSS/BLUE SHIELD

## 2016-07-09 DIAGNOSIS — M25611 Stiffness of right shoulder, not elsewhere classified: Secondary | ICD-10-CM | POA: Diagnosis not present

## 2016-07-09 DIAGNOSIS — M79601 Pain in right arm: Secondary | ICD-10-CM

## 2016-07-09 DIAGNOSIS — M25511 Pain in right shoulder: Secondary | ICD-10-CM

## 2016-07-09 DIAGNOSIS — R293 Abnormal posture: Secondary | ICD-10-CM

## 2016-07-09 NOTE — Therapy (Signed)
[place], [place], [address] Phone: [phone]   Fax:  [phone]  Physical Therapy Treatment  Patient Details  Name: Patricia Chandler MRN: [id number] Date of Birth: April 07, 1956 Referring Provider: Dr. Irene Limbo  Encounter Date: 07/09/2016      PT End of Session - 07/09/16 1154    Visit Number 5   Number of Visits 12   Date for PT Re-Evaluation 07/22/16   PT Start Time 1101   PT Stop Time 1147   PT Time Calculation (min) 46 min   Activity Tolerance Patient tolerated treatment well   Behavior During Therapy [facility] for tasks assessed/performed      Past Medical History:  Diagnosis Date  . Anxiety   . Breast cancer ([place])   . Chronic kidney disease   . Complication of anesthesia 12/2015   gastric content to come in LMA, needed intubation, had severe laryngitis- lasted loner than 1 month  . Diverticulosis   . Family history of adverse reaction to anesthesia    daughter - nausea  . Family history of breast cancer   . Genital herpes   . GERD (gastroesophageal reflux disease)   . Heart murmur    had ECHO 12 yrs ago was neg  . Internal hemorrhoid   . Pneumonia    3 times- viral last time approx 2014    Past Surgical History:  Procedure Laterality Date  . BREAST ENHANCEMENT SURGERY  2005  . BREAST LUMPECTOMY  1973   right/BENIGN  . BREAST RECONSTRUCTION WITH PLACEMENT OF TISSUE EXPANDER AND FLEX HD (ACELLULAR HYDRATED DERMIS) Right 05/19/2016   Procedure: RIGHT BREAST RECONSTRUCTION WITH PLACEMENT OF TISSUE EXPANDER AND  ALLODERM.;  Surgeon: Irene Limbo, MD;  Location: [facility];  Service: Plastics;  Laterality: Right;  . COLONOSCOPY    . DEBRIDEMENT AND CLOSURE WOUND Right 06/08/2016   Procedure: DEBRIDEMENT RIGHT MASTECTOMY FLAP, TISSUE EXPANSION OF RIGHT CHEST;  Surgeon: Irene Limbo, MD;  Location: [facility];  Service: Plastics;  Laterality: Right;  . NIPPLE  SPARING MASTECTOMY/SENTINAL LYMPH NODE BIOPSY/RECONSTRUCTION/PLACEMENT OF TISSUE EXPANDER Right 05/19/2016   Procedure: RIGHT NIPPLE SPARING MASTECTOMY WITH SENTINAL LYMPH NODE BIOPSY WITH BLUE DYE INJECTION;  Surgeon: Rolm Bookbinder, MD;  Location: [facility];  Service: General;  Laterality: Right;  . PORTACATH PLACEMENT N/A 12/30/2015   Procedure: INSERTION PORT-A-CATH WITH [place];  Surgeon: Rolm Bookbinder, MD;  Location: [place];  Service: General;  Laterality: N/A;    There were no vitals filed for this visit.      Subjective Assessment - 07/09/16 1111    Subjective I'm doing good overall!    Pertinent History Right mastectomy with sentinel node biopsy and expander placed on 05/19/16 followed by debridement of necrotic tissue around her nipple 2 weeks after that. She had neoadjuvant chemotherapy until 04/15/17 but is currently getting Herceptin. Her breast cancer was estrogen, progesterone and HER2 positive. Her axillary nodes were negative and she does not require radiation.   Patient Stated Goals Improve shoulder ROM and reduce pain   Currently in Pain? No/denies                         [facility] Adult PT Treatment/Exercise - 07/09/16 0001      Shoulder Exercises: Pulleys   Flexion 3 minutes   ABduction 3 minutes     Shoulder Exercises: Therapy Ball   Flexion 10 reps  With forward lean  into end of stretch   ABduction 10 reps  Rt UE with side lean into end of stretch     Shoulder Exercises: Stretch   Corner Stretch 5 reps;10 seconds     Manual Therapy   Manual Therapy Myofascial release;Scapular mobilization   Myofascial Release right UE myofascial pulling with movement into abduction   Scapular Mobilization in left sidelying, right scapula movement into protraction at various angles, and into depression, with gentle rocking motions   Passive ROM In Supine to Rt shoulder into flexion, abduction, and er, all slowly and to pts  tolerance.                         [facility] Goals - 07/06/16 1440      CC Long Term Goal  #1   Title Patient will be independent in home exercise program to tolerate daily tasks.   Status Partially Met     CC Long Term Goal  #2   Title Increase right shoulder active flexion to >/= 140 degrees for increased ease reaching overhead.   Baseline 136 as of 07/06/16.   Status Partially Met     CC Long Term Goal  #3   Title Increase right shoulder active abduction to >/= 140 degrees for increased ease reaching overhead.   Status Achieved     CC Long Term Goal  #4   Title Report >/= 50% reduction in right chest and arm pain to return to household tasks.   Baseline 85% improved as of 07/06/16   Status Achieved     CC Long Term Goal  #5   Title Report she is independent with >/= 80% of normal daily tasks including house cleaning and returning to yoga.   Baseline As of 07/06/16, is doing 100% of household tasks, but hasn't gone back to yoga yet.   Status Partially Met            Plan - 07/09/16 1155    Clinical Impression Statement Pt continues to show progress, though slowly. She is overall pleased with her proress and is doing well with compliance of HEP. Her end ROM with P/ROM is still limited as is her scapular movement. Pt will benefit from continued therapy to help her further her progress with helping hr achieve optimal ROM and postural strength.    Rehab Potential Excellent   Clinical Impairments Affecting Rehab Potential None   PT Frequency 3x / week   PT Duration 4 weeks   PT Treatment/Interventions ADLs/Self Care Home Management;Therapeutic activities;Therapeutic exercise;Patient/family education;Passive range of motion;Scar mobilization;Manual techniques   PT Next Visit Plan Cont PROM and shoulder ROM exercises; review supine scapular series with yellow theraband prn next session;   Consulted and Agree with Plan of Care Patient      Patient will  benefit from skilled therapeutic intervention in order to improve the following deficits and impairments:  Postural dysfunction, Decreased strength, Impaired flexibility, Decreased knowledge of precautions, Decreased activity tolerance, Decreased skin integrity, Pain, Impaired UE functional use, Decreased range of motion, Increased fascial restricitons  Visit Diagnosis: Stiffness of right shoulder, not elsewhere classified  Pain in right arm  Acute pain of right shoulder  Abnormal posture     Problem List Patient Active Problem List   Diagnosis Date Noted  . Chemotherapy-induced thrombocytopenia 03/30/2016  . Genetic testing 01/10/2016  . Port catheter in place 01/08/2016  . History of sepsis 01/08/2016  . GERD (gastroesophageal reflux disease) 01/08/2016  .  Breast cancer of upper-inner quadrant of right female breast ([place]) 12/18/2015  . Family history of breast cancer   . Internal hemorrhoid 01/26/2011    Otelia Limes, PTA 07/09/2016, 12:02 PM  [place], [place], [address] Phone: [phone]   Fax:  [phone]  Name: ELIANNAH HINDE MRN: [id number] Date of Birth: 28-Mar-1956

## 2016-07-10 ENCOUNTER — Other Ambulatory Visit (HOSPITAL_COMMUNITY): Payer: Self-pay

## 2016-07-10 MED ORDER — LOSARTAN POTASSIUM 25 MG PO TABS: 12.5000 mg | ORAL_TABLET | Freq: Every day | ORAL | 3 refills | Status: DC

## 2016-07-13 ENCOUNTER — Ambulatory Visit: Payer: BLUE CROSS/BLUE SHIELD

## 2016-07-13 DIAGNOSIS — M79601 Pain in right arm: Secondary | ICD-10-CM

## 2016-07-13 DIAGNOSIS — M25611 Stiffness of right shoulder, not elsewhere classified: Secondary | ICD-10-CM

## 2016-07-13 DIAGNOSIS — R293 Abnormal posture: Secondary | ICD-10-CM

## 2016-07-13 DIAGNOSIS — M25511 Pain in right shoulder: Secondary | ICD-10-CM

## 2016-07-13 NOTE — Therapy (Addendum)
[place], [place], [address] Phone: [phone]   Fax:  [phone]  Physical Therapy Treatment  Patient Details  Name: Patricia Chandler MRN: [id number] Date of Birth: [birth year] Referring Provider: Dr. Irene Limbo  Encounter Date: 07/13/2016      PT End of Session - 07/13/16 1225    Visit Number 6   Number of Visits 12   Date for PT Re-Evaluation 07/22/16   PT Start Time 0848   PT Stop Time 0935   PT Time Calculation (min) 47 min   Activity Tolerance Patient tolerated treatment well   Behavior During Therapy [facility] for tasks assessed/performed      Past Medical History:  Diagnosis Date  . Anxiety   . Breast cancer ([place])   . Chronic kidney disease   . Complication of anesthesia 12/2015   gastric content to come in LMA, needed intubation, had severe laryngitis- lasted loner than 1 month  . Diverticulosis   . Family history of adverse reaction to anesthesia    daughter - nausea  . Family history of breast cancer   . Genital herpes   . GERD (gastroesophageal reflux disease)   . Heart murmur    had ECHO 12 yrs ago was neg  . Internal hemorrhoid   . Pneumonia    3 times- viral last time approx 2014    Past Surgical History:  Procedure Laterality Date  . BREAST ENHANCEMENT SURGERY  2005  . BREAST LUMPECTOMY  1973   right/BENIGN  . BREAST RECONSTRUCTION WITH PLACEMENT OF TISSUE EXPANDER AND FLEX HD (ACELLULAR HYDRATED DERMIS) Right 05/19/2016   Procedure: RIGHT BREAST RECONSTRUCTION WITH PLACEMENT OF TISSUE EXPANDER AND  ALLODERM.;  Surgeon: Irene Limbo, MD;  Location: [place];  Service: Plastics;  Laterality: Right;  . COLONOSCOPY    . DEBRIDEMENT AND CLOSURE WOUND Right 06/08/2016   Procedure: DEBRIDEMENT RIGHT MASTECTOMY FLAP, TISSUE EXPANSION OF RIGHT CHEST;  Surgeon: Irene Limbo, MD;  Location: [place];  Service: Plastics;  Laterality: Right;  . NIPPLE  SPARING MASTECTOMY/SENTINAL LYMPH NODE BIOPSY/RECONSTRUCTION/PLACEMENT OF TISSUE EXPANDER Right 05/19/2016   Procedure: RIGHT NIPPLE SPARING MASTECTOMY WITH SENTINAL LYMPH NODE BIOPSY WITH BLUE DYE INJECTION;  Surgeon: Rolm Bookbinder, MD;  Location: [place];  Service: General;  Laterality: Right;  . PORTACATH PLACEMENT N/A 12/30/2015   Procedure: INSERTION PORT-A-CATH WITH [place];  Surgeon: Rolm Bookbinder, MD;  Location: [place];  Service: General;  Laterality: N/A;    There were no vitals filed for this visit.      Subjective Assessment - 07/13/16 0859    Subjective Nothing new, doing good.    Pertinent History Right mastectomy with sentinel node biopsy and expander placed on 05/19/16 followed by debridement of necrotic tissue around her nipple 2 weeks after that. She had neoadjuvant chemotherapy until 04/15/17 but is currently getting Herceptin. Her breast cancer was estrogen, progesterone and HER2 positive. Her axillary nodes were negative and she does not require radiation.   Patient Stated Goals Improve shoulder ROM and reduce pain   Currently in Pain? No/denies            [facility] PT Assessment - 07/13/16 0001      AROM   Right Shoulder Flexion 153 Degrees   Right Shoulder ABduction 165 Degrees                     [facility] Adult PT Treatment/Exercise - 07/13/16 0001  Manual Therapy   Manual Therapy Myofascial release;Scapular mobilization   Myofascial Release right UE myofascial pulling with movement into abduction   Scapular Mobilization in left sidelying, right scapula movement into protraction at various angles, and into depression, with gentle rocking motions   Passive ROM In Supine to Rt shoulder into flexion, abduction, and er, all slowly and to pts tolerance.                 PT Education - 07/13/16 1235    Education provided Yes   Education Details Issued handout to pt for 3 way raises after brief demonstration  but did not have time to have pt perform today.   Person(s) Educated Patient   Methods Explanation;Demonstration;Handout   Comprehension Verbalized understanding;Need further instruction                [facility] Goals - 07/13/16 [phone]      CC Long Term Goal  #1   Title Patient will be independent in home exercise program to tolerate daily tasks.   Status Partially Met     CC Long Term Goal  #2   Title Increase right shoulder active flexion to >/= 140 degrees for increased ease reaching overhead.   Baseline 136 as of 07/06/16; 153 degrees-07/13/16   Status Achieved     CC Long Term Goal  #3   Title Increase right shoulder active abduction to >/= 140 degrees for increased ease reaching overhead.   Baseline 160 degrees-07/13/16   Status Achieved     CC Long Term Goal  #4   Title Report >/= 50% reduction in right chest and arm pain to return to household tasks.   Baseline 85% improved as of 07/06/16; 100%-07/13/16   Status Achieved     CC Long Term Goal  #5   Title Report she is independent with >/= 80% of normal daily tasks including house cleaning and returning to yoga.   Baseline As of 07/06/16, is doing 100% of household tasks, but hasn't gone back to yoga yet.; 95% improvement at this time (hasnb't returned to yoga yet but thinks she could)-07/13/16   Status Achieved            Plan - 07/13/16 1228    Clinical Impression Statement Pt has made great progress this week exceeding her ROM goals and has met all but one goal (this one partially met only because she hasn't tried yoga yet which she plans to get back into soon). Pt feels she is doing well and would like to D/C next visit.    Rehab Potential Excellent   Clinical Impairments Affecting Rehab Potential None   PT Frequency 3x / week   PT Duration 4 weeks   PT Treatment/Interventions ADLs/Self Care Home Management;Therapeutic activities;Therapeutic exercise;Patient/family education;Passive range of motion;Scar  mobilization;Manual techniques   PT Next Visit Plan Review 3 way raises with pt and assess HEP. Plan to D/C next visit.    Consulted and Agree with Plan of Care Patient      Patient will benefit from skilled therapeutic intervention in order to improve the following deficits and impairments:  Postural dysfunction, Decreased strength, Impaired flexibility, Decreased knowledge of precautions, Decreased activity tolerance, Decreased skin integrity, Pain, Impaired UE functional use, Decreased range of motion, Increased fascial restricitons  Visit Diagnosis: Stiffness of right shoulder, not elsewhere classified  Pain in right arm  Acute pain of right shoulder  Abnormal posture     Problem List Patient Active Problem List  Diagnosis Date Noted  . Chemotherapy-induced thrombocytopenia 03/30/2016  . Genetic testing 01/10/2016  . Port catheter in place 01/08/2016  . History of sepsis 01/08/2016  . GERD (gastroesophageal reflux disease) 01/08/2016  . Breast cancer of upper-inner quadrant of right female breast ([place]) 12/18/2015  . Family history of breast cancer   . Internal hemorrhoid 01/26/2011    Otelia Limes, PTA 07/13/2016, 12:36 PM  [place], [place], [address] Phone: [phone]   Fax:  [phone]  Name: Patricia Chandler MRN: [id number] Date of Birth: [birth year]  PHYSICAL THERAPY DISCHARGE SUMMARY  Visits from Start of Care: 6  Current functional level related to goals / functional outcomes: Goals met except for the fact that patient hadn't tried yoga yet at the time of her last visit.   Remaining deficits: Virtually none. She had made great improvements.  She was going to return for one final visit to wrap up home program instruction, but did not do that (so this discharge was delayed).   Education / Equipment: Home exercise program.   Plan: Patient agrees to  discharge.  Patient goals were met. Patient is being discharged due to meeting the stated rehab goals.  ?????    Serafina Royals, PT 06/04/17 9:19 AM

## 2016-07-13 NOTE — Patient Instructions (Signed)

## 2016-07-15 ENCOUNTER — Encounter: Payer: BLUE CROSS/BLUE SHIELD | Admitting: Physical Therapy

## 2016-07-16 ENCOUNTER — Ambulatory Visit: Payer: BLUE CROSS/BLUE SHIELD | Admitting: Physical Therapy

## 2016-07-24 MED FILL — LOSARTAN POTASSIUM 25 MG TA: 25 | 30 days supply | Qty: 15 | Fill #0

## 2016-08-04 ENCOUNTER — Encounter (HOSPITAL_BASED_OUTPATIENT_CLINIC_OR_DEPARTMENT_OTHER): Payer: Self-pay | Admitting: *Deleted

## 2016-08-04 DIAGNOSIS — R21 Rash and other nonspecific skin eruption: Secondary | ICD-10-CM

## 2016-08-04 HISTORY — DX: Rash and other nonspecific skin eruption: R21

## 2016-08-04 NOTE — Pre-Procedure Instructions (Signed)
To come pick up Boost Breeze 8 oz. - to drink by 0415 DOS.

## 2016-08-04 NOTE — H&P (Signed)
Subjective:     Patient ID: Patricia Chandler is a 61 y.o. female.  HPI  Nearly 3 months post op right NSM with expander based reconstrution. Course complicated by necrosis NAC and post debridement.   Presented following diagnostic MMG 6 month follow up following benign right breast biospy 2015. Two masses noted : a 1.7 cm mass located within the right breast at 2 o'clock position 4 cm from the nipple and a 1.3 cm mass located within the right breast at 2 o'clock position 6 cm from the nipple.  7 mm satellite nodule located between these 2 masses. [place] axilla negative. The tumor that is 4cm from the nipple at 2:00 position was a IDC, ER/PR +,  HER-2 +. The second tumor which measured 1.3 cm which is at 6 cm from the nipple is IDC ER/PR +HER-2 -.  MRI showed right UIQ 3 adjacent enhancing masses measuring 1.7 x 1.5 x 1.4 cm, 1.0 x 0.7 x 0.6 cm and 1.4 x 1.3 x 1.3 cm. The masses span an area of 3.7 x 1.4 x 1.4 cm. A well-circumscribed 8 mm enhancing mass in the upper-outer quadrant of the right breast corresponding with an intramammary lymph node that has been stable since 2013. Completed neoadjuvant chemotherapy. Final MRI with resolution of the 2 biopsied malignancies. Final pathology IDC, 1.3 cm, low-grade DCIS, LVI present, margins negative, 0/1 lymph node negative; repeat HER-2 negative. Plan continue Herceptin, start letrozole 06/2016.  Genetics negative  History significant for augmentation 2005 with Dr. Wendy Poet. Patient has no information regarding her implants, states they are saline. Prior to augmentation A cup, prior to mastectomy D. "Could go down a size." Right mastectomy 177g Removed intact smooth saline round implant with label "[employer] [phone]." Approximately 330 ml drained from this implant     Objective:   Physical Exam  Cardiovascular: Normal rate, regular rhythm and normal heart sounds.   Pulmonary/Chest: Effort normal and breath sounds normal.  Abdominal:  Soft.   Chest: scar maturing, with two few mm scabs over NAC scar, soft SN to nipple L 23 cm, left areolar scar BW R 12 L 14 cm Nipple to IMF L 10 Right IMF appr 1 cm superior to left 5 cm soft tissue pinch over left  Assessment:     Right breast ca multifocal UIQ History augmentation mammaplasty S/p right NSM, SLN, TE/ADM (Alloderm) reconstruction S/p debridement right mastectomy flap, resection NAC    Plan:    Plan second stage surgery for removal expander and placement implant. Reviewed she has had saline implants and been happy, the benefit in my mind with silicone would be capacity and cohesive options that may offer less visible rippling. She has elected for silicone, declines textured. Plan smooth round.  With regards to left breast, she desires more symmetry and plan for mastopexy. Discussed lollipop scar possible anchor scar. She expresses desire to be somewhat smaller and counseled the anticipated mastopexy resection volume will be small, likely not have effect on overall volume. Reviewed with aging her native breast tissue will descend over implant and will develop asymmetry again with aging. We discussed if implant cavity is not encountered during mastopexy, will leave saline implant in place. If the implant is encountered, plan exchange to similar dimensions, possible smaller silicone implant. Reviewed MRI surveillance and FDA recommendations regarding this.  Reviewed fat grafting to aid with contour, add soft tissue to flaps. Reviewed pain donor site, need for compression donor site, variable take graft. No plan for fat  grafting at this time, discussed can do in future if needed. Patient asked if ADM can be used on opposite breast to lift,stabilize. Counseled that this is used in augmentation setting, but would reserve this until she has a problem with bottoming out or displacement.  Plan OP surgery, likely no drains.  Natrelle 133MX-13-T 500 cc tissue expander placed,   490 ml total fill volume   Irene Limbo, MD [facility] [phone], pin [phone]

## 2016-08-05 ENCOUNTER — Ambulatory Visit (INDEPENDENT_AMBULATORY_CARE_PROVIDER_SITE_OTHER): Payer: BLUE CROSS/BLUE SHIELD | Admitting: Adult Health

## 2016-08-05 ENCOUNTER — Encounter: Payer: Self-pay | Admitting: Adult Health

## 2016-08-05 VITALS — BP 102/64 | Temp 98.2°F | Ht 63.0 in | Wt 128.9 lb

## 2016-08-05 DIAGNOSIS — R21 Rash and other nonspecific skin eruption: Secondary | ICD-10-CM | POA: Diagnosis not present

## 2016-08-05 DIAGNOSIS — W57XXXA Bitten or stung by nonvenomous insect and other nonvenomous arthropods, initial encounter: Secondary | ICD-10-CM

## 2016-08-05 MED ORDER — DOXYCYCLINE HYCLATE 100 MG PO CAPS: 200.0000 mg | ORAL_CAPSULE | Freq: Once | ORAL | 0 refills | Status: AC

## 2016-08-05 MED FILL — DOXYCYCLINE HYCLATE 100 MG: 100 | 1 days supply | Qty: 2 | Fill #0

## 2016-08-05 NOTE — Progress Notes (Signed)
Subjective:    Patient ID: Patricia Chandler, female    DOB: 1955-09-02, 61 y.o.   MRN: [id number]  HPI  61 year old female who  has a past medical history of Anemia; Anxiety; Cardiomyopathy ([place]); Dental crowns present; Family history of adverse reaction to anesthesia; GERD (gastroesophageal reflux disease); History of breast cancer (2017); History of chemotherapy; Rash (08/04/2016); and Runny nose (06/06/2016).   She presents to the office today for a tick bite that she found on Sunday. She is unsure of how long the tick was attached. This tick bite was next to the right breast and she is having operated on in a week. She has noticed a red rash around the tick bite.   She also found a tick on her upper thigh yesterday. She has been able to remove tick completely.   She denies bullseye rash, fever, joint or muscle pain  She [place] and is exposed to tick bites    Review of Systems See HPI   Past Medical History:  Diagnosis Date  . Anemia    due to chemo  . Anxiety   . Cardiomyopathy ([place])    mild, per cardiology note; due to Herceptin  . Dental crowns present   . Family history of adverse reaction to anesthesia    pt's daughter has hx. of post-op nausea  . GERD (gastroesophageal reflux disease)   . History of breast cancer 2017   right  . History of chemotherapy    finished chemo 04/15/2016  . Rash 08/04/2016   under right arm - states recent tick bite; to see MD 08/05/2016  . Runny nose 06/06/2016   clear drainage, per pt.    Social History   Social History  . Marital status: Married    Spouse name: N/A  . Number of children: 2  . Years of education: N/A   Occupational History  . medical receptionist    Social History Main Topics  . Smoking status: Never Smoker  . Smokeless tobacco: Never Used  . Alcohol use Yes     Comment: occasionally  . Drug use: No  . Sexual activity: Not on file   Other Topics Concern  . Not on file    Social History Narrative   Married 1978 (Al). 2 girls 33 and 36 in 2018. [name] lives in [place], [place] with 41 yo son (single right now). and [name] in [place], [place]--> now [place] with husband and 26 year old daughter (born with heart defect with missing valve and initially not closed VSD--still weighs heavily on family- likely will have to be repaired).       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    Graduated from [employer]. [employer] prior.       Hobbies: gardening, decorating, sewing, enjoys creating    Past Surgical History:  Procedure Laterality Date  . BREAST ENHANCEMENT SURGERY Bilateral 2005  . BREAST RECONSTRUCTION WITH PLACEMENT OF TISSUE EXPANDER AND FLEX HD (ACELLULAR HYDRATED DERMIS) Right 05/19/2016   Procedure: RIGHT BREAST RECONSTRUCTION WITH PLACEMENT OF TISSUE EXPANDER AND  ALLODERM.;  Surgeon: Irene Limbo, MD;  Location: [place];  Service: Plastics;  Laterality: Right;  . COLONOSCOPY    . COLONOSCOPY WITH PROPOFOL  10/02/2014  . DEBRIDEMENT AND CLOSURE WOUND Right 06/08/2016   Procedure: DEBRIDEMENT RIGHT MASTECTOMY FLAP, TISSUE EXPANSION OF RIGHT CHEST;  Surgeon: Arnoldo Hooker  Iran Planas, MD;  Location: [place];  Service: Plastics;  Laterality: Right;  . NIPPLE SPARING MASTECTOMY/SENTINAL LYMPH NODE BIOPSY/RECONSTRUCTION/PLACEMENT OF TISSUE EXPANDER Right 05/19/2016   Procedure: RIGHT NIPPLE SPARING MASTECTOMY WITH SENTINAL LYMPH NODE BIOPSY WITH BLUE DYE INJECTION;  Surgeon: Rolm Bookbinder, MD;  Location: [place];  Service: General;  Laterality: Right;  . PORTACATH PLACEMENT N/A 12/30/2015   Procedure: INSERTION PORT-A-CATH WITH [place];  Surgeon: Rolm Bookbinder, MD;  Location: [place];  Service: General;  Laterality: N/A;    Family History  Problem Relation Age of Onset  . Breast cancer Maternal Aunt     dx in her 49s  . Stroke  Father     late 28s  . Hypertension Father   . Pulmonary fibrosis Mother   . Breast cancer Paternal Aunt 78  . Cancer Paternal Grandmother     abdominal; cervical vs uterine dx in her 40s  . Parkinson's disease Maternal Grandfather   . Anesthesia problems Daughter     post-op nausea    No Known Allergies  Current Outpatient Prescriptions on File Prior to Visit  Medication Sig Dispense Refill  . ALPRAZolam (XANAX) 0.5 MG tablet Take 0.5 mg by mouth at bedtime as needed for anxiety. Takes 1/3 of a tablet    . letrozole (FEMARA) 2.5 MG tablet Take 2.5 mg by mouth daily.    Marland Kitchen losartan (COZAAR) 25 MG tablet Take 0.5 tablets (12.5 mg total) by mouth at bedtime. 15 tablet 3  . pantoprazole (PROTONIX) 40 MG tablet Take 40 mg by mouth daily.   0  . [DISCONTINUED] prochlorperazine (COMPAZINE) 10 MG tablet Take 1 tablet (10 mg total) by mouth every 6 (six) hours as needed (Nausea or vomiting). 30 tablet 1   No current facility-administered medications on file prior to visit.     BP 102/64 (BP Location: Left Arm, Patient Position: Sitting, Cuff Size: Normal)   Temp 98.2 F (36.8 C) (Oral)   Ht 5\' 3"  (1.6 m)   Wt 128 lb 14.4 oz (58.5 kg)   BMI 22.83 kg/m       Objective:   Physical Exam  Constitutional: She is oriented to person, place, and time. She appears well-developed and well-nourished. No distress.  Musculoskeletal: Normal range of motion. She exhibits no edema, tenderness or deformity.  Neurological: She is alert and oriented to person, place, and time.  Skin: Skin is warm and dry. Rash noted. She is not diaphoretic. No erythema. No pallor.  Small red localized rash in axilla of right arm. No bulls eye rash noted.   No rash noted in groin   Psychiatric: She has a normal mood and affect. Her behavior is normal. Judgment and thought content normal.  Nursing note and vitals reviewed.     Assessment & Plan:  1. Tick bite, initial encounter - Will treat with prophylaxis dose  of doxycycline  - doxycycline (VIBRAMYCIN) 100 MG capsule; Take 2 capsules (200 mg total) by mouth once.  Dispense: 2 capsule; Refill: 0 - Return precautions reviewed with patient.  - Follow up as needed  Dorothyann Peng, NP

## 2016-08-07 ENCOUNTER — Telehealth: Payer: Self-pay | Admitting: Emergency Medicine

## 2016-08-07 NOTE — Telephone Encounter (Signed)
Patient called requesting appointment on 5/2 for herceptin; according to plan she is to have echo on 5/24 prior to restarting herceptin; will confirm with Dr Lindi Adie. Left message on patient's cell phone explaining plan.

## 2016-08-10 ENCOUNTER — Telehealth (HOSPITAL_COMMUNITY): Payer: Self-pay | Admitting: *Deleted

## 2016-08-10 ENCOUNTER — Telehealth: Payer: Self-pay

## 2016-08-10 NOTE — Telephone Encounter (Signed)
Returned pt call to let her know that she will need to be cleared by cardiology prior to restarting herceptin infusions. Pt is scheduled for echo on 5/24. Per Dr.Bensimhon's note 07/01/16, pt is to hold herceptin until updated echo results.Dr.Gudena notified and is aware.

## 2016-08-10 NOTE — Anesthesia Preprocedure Evaluation (Addendum)
Anesthesia Evaluation  Patient identified by MRN, date of birth, ID band Patient awake    Reviewed: Allergy & Precautions, NPO status , Patient's Chart, lab work & pertinent test results  History of Anesthesia Complications (+) history of anesthetic complications  Airway Mallampati: II  TM Distance: <3 FB   Mouth opening: Limited Mouth Opening  Dental  (+) Teeth Intact   Pulmonary neg pulmonary ROS,    breath sounds clear to auscultation       Cardiovascular  Rhythm:Regular Rate:Normal  03/2016 Echo. Normal EF   Neuro/Psych negative neurological ROS     GI/Hepatic GERD  ,  Endo/Other    Renal/GU      Musculoskeletal   Abdominal   Peds  Hematology  (+) anemia ,   Anesthesia Other Findings   Reproductive/Obstetrics                             Lab Results  Component Value Date   WBC 3.8 (L) 06/16/2016   HGB 9.1 (L) 06/16/2016   HCT 27.3 (L) 06/16/2016   MCV 96.1 06/16/2016   PLT 223 06/16/2016   Lab Results  Component Value Date   CREATININE 0.9 06/16/2016   BUN 21.0 06/16/2016   NA 136 06/16/2016   K 4.2 06/16/2016   CL 111 01/12/2016   CO2 23 06/16/2016    Anesthesia Physical  Anesthesia Plan  ASA: II  Anesthesia Plan: General   Post-op Pain Management:    Induction: Intravenous  Airway Management Planned: Oral ETT and Video Laryngoscope Planned  Additional Equipment:   Intra-op Plan:   Post-operative Plan: Extubation in OR  Informed Consent: I have reviewed the patients History and Physical, chart, labs and discussed the procedure including the risks, benefits and alternatives for the proposed anesthesia with the patient or authorized representative who has indicated his/her understanding and acceptance.   Dental advisory given  Plan Discussed with:   Anesthesia Plan Comments: (Previous airway difficulty noted per pt Hx and chart We'll use glidescope and  smaller ETT)       Anesthesia Quick Evaluation                                   Anesthesia Evaluation  Patient identified by MRN, date of birth, ID band Patient awake    Reviewed: Allergy & Precautions, NPO status , Patient's Chart, lab work & pertinent test results  History of Anesthesia Complications (+) history of anesthetic complications  Airway Mallampati: II  TM Distance: <3 FB   Mouth opening: Limited Mouth Opening  Dental  (+) Teeth Intact   Pulmonary neg pulmonary ROS,    breath sounds clear to auscultation       Cardiovascular  Rhythm:Regular Rate:Normal  03/2016 Echo. Normal EF   Neuro/Psych negative neurological ROS     GI/Hepatic GERD  ,  Endo/Other    Renal/GU      Musculoskeletal   Abdominal   Peds  Hematology  (+) anemia ,   Anesthesia Other Findings   Reproductive/Obstetrics                            Lab Results  Component Value Date   WBC 3.8 (L) 06/16/2016   HGB 9.1 (L) 06/16/2016   HCT 27.3 (L) 06/16/2016   MCV 96.1 06/16/2016   PLT 223  06/16/2016   Lab Results  Component Value Date   CREATININE 0.9 06/16/2016   BUN 21.0 06/16/2016   NA 136 06/16/2016   K 4.2 06/16/2016   CL 111 01/12/2016   CO2 23 06/16/2016    Anesthesia Physical  Anesthesia Plan  ASA: II  Anesthesia Plan: General   Post-op Pain Management:    Induction: Intravenous  Airway Management Planned: Oral ETT  Additional Equipment:   Intra-op Plan:   Post-operative Plan: Extubation in OR  Informed Consent: I have reviewed the patients History and Physical, chart, labs and discussed the procedure including the risks, benefits and alternatives for the proposed anesthesia with the patient or authorized representative who has indicated his/her understanding and acceptance.   Dental advisory given  Plan Discussed with:   Anesthesia Plan Comments:        Anesthesia Quick Evaluation

## 2016-08-10 NOTE — Telephone Encounter (Signed)
Pt called wondering why she couldn't schedule her next herceptin treatment.  Looking through Anthony last note she was to hold herceptin for 6 weeks (2 doses) and then have an echo in 2 months.  If the echo was ok she could restart treatment.  Patient voiced understanding. Pt scheduled for echo 5/24

## 2016-08-11 ENCOUNTER — Encounter (HOSPITAL_BASED_OUTPATIENT_CLINIC_OR_DEPARTMENT_OTHER): Payer: Self-pay | Admitting: *Deleted

## 2016-08-11 ENCOUNTER — Ambulatory Visit (HOSPITAL_BASED_OUTPATIENT_CLINIC_OR_DEPARTMENT_OTHER)
Admission: RE | Admit: 2016-08-11 | Discharge: 2016-08-11 | Disposition: A | Discharge status: Home / Self Care | Payer: BLUE CROSS/BLUE SHIELD | Source: Ambulatory Visit | Attending: Plastic Surgery | Admitting: Plastic Surgery

## 2016-08-11 ENCOUNTER — Ambulatory Visit (HOSPITAL_BASED_OUTPATIENT_CLINIC_OR_DEPARTMENT_OTHER): Payer: BLUE CROSS/BLUE SHIELD | Admitting: Anesthesiology

## 2016-08-11 ENCOUNTER — Encounter (HOSPITAL_BASED_OUTPATIENT_CLINIC_OR_DEPARTMENT_OTHER)
Admission: RE | Disposition: A | Discharge status: Home / Self Care | Payer: Self-pay | Source: Ambulatory Visit | Attending: Plastic Surgery

## 2016-08-11 DIAGNOSIS — Z79899 Other long term (current) drug therapy: Secondary | ICD-10-CM | POA: Diagnosis not present

## 2016-08-11 DIAGNOSIS — Z853 Personal history of malignant neoplasm of breast: Secondary | ICD-10-CM | POA: Insufficient documentation

## 2016-08-11 DIAGNOSIS — Z421 Encounter for breast reconstruction following mastectomy: Secondary | ICD-10-CM | POA: Insufficient documentation

## 2016-08-11 DIAGNOSIS — K219 Gastro-esophageal reflux disease without esophagitis: Secondary | ICD-10-CM | POA: Insufficient documentation

## 2016-08-11 HISTORY — DX: Dental restoration status: Z98.811

## 2016-08-11 HISTORY — DX: Personal history of antineoplastic chemotherapy: Z92.21

## 2016-08-11 HISTORY — PX: MASTOPEXY: SHX5358

## 2016-08-11 HISTORY — PX: REMOVAL OF TISSUE EXPANDER AND PLACEMENT OF IMPLANT: SHX6457

## 2016-08-11 HISTORY — DX: Cardiomyopathy, unspecified: I42.9

## 2016-08-11 HISTORY — DX: Anemia, unspecified: D64.9

## 2016-08-11 HISTORY — DX: Personal history of malignant neoplasm of breast: Z85.3

## 2016-08-11 HISTORY — DX: Rash and other nonspecific skin eruption: R21

## 2016-08-11 HISTORY — DX: Other specified symptoms and signs involving the circulatory and respiratory systems: R09.89

## 2016-08-11 SURGERY — REMOVAL, TISSUE EXPANDER, BREAST, WITH IMPLANT INSERTION
Anesthesia: General | Site: Breast | Laterality: Right

## 2016-08-11 MED ORDER — SCOPOLAMINE 1 MG/3DAYS TD PT72: 1.0000 | MEDICATED_PATCH | Freq: Once | TRANSDERMAL | Status: DC | PRN

## 2016-08-11 MED ORDER — ONDANSETRON HCL 4 MG/2ML IJ SOLN
INTRAMUSCULAR | Status: DC | PRN
  Administered 2016-08-11: 4 mg via INTRAVENOUS

## 2016-08-11 MED ORDER — DEXAMETHASONE SODIUM PHOSPHATE 4 MG/ML IJ SOLN
INTRAMUSCULAR | Status: DC | PRN
  Administered 2016-08-11: 10 mg via INTRAVENOUS

## 2016-08-11 MED ORDER — ONDANSETRON HCL 4 MG/2ML IJ SOLN
INTRAMUSCULAR | Status: AC
  Filled 2016-08-11: qty 2

## 2016-08-11 MED ORDER — ACETAMINOPHEN 500 MG PO TABS
1000.0000 mg | ORAL_TABLET | ORAL | Status: AC
  Administered 2016-08-11: 1000 mg via ORAL

## 2016-08-11 MED ORDER — POVIDONE-IODINE 10 % EX SOLN
CUTANEOUS | Status: DC | PRN
  Administered 2016-08-11: 1 via TOPICAL

## 2016-08-11 MED ORDER — EPHEDRINE 5 MG/ML INJ
INTRAVENOUS | Status: AC
  Filled 2016-08-11: qty 10

## 2016-08-11 MED ORDER — DEXAMETHASONE SODIUM PHOSPHATE 10 MG/ML IJ SOLN
INTRAMUSCULAR | Status: AC
  Filled 2016-08-11: qty 1

## 2016-08-11 MED ORDER — SULFAMETHOXAZOLE-TRIMETHOPRIM 800-160 MG PO TABS: 1.0000 | ORAL_TABLET | Freq: Two times a day (BID) | ORAL | 0 refills | Status: DC

## 2016-08-11 MED ORDER — SUFENTANIL CITRATE 50 MCG/ML IV SOLN
INTRAVENOUS | Status: DC | PRN
  Administered 2016-08-11 (×3): 5 ug via INTRAVENOUS

## 2016-08-11 MED ORDER — DEXTROSE 5 % IV SOLN
INTRAVENOUS | Status: DC | PRN
  Administered 2016-08-11: 20 ug/min via INTRAVENOUS

## 2016-08-11 MED ORDER — FENTANYL CITRATE (PF) 100 MCG/2ML IJ SOLN
25.0000 ug | INTRAMUSCULAR | Status: DC | PRN
  Administered 2016-08-11: 50 ug via INTRAVENOUS

## 2016-08-11 MED ORDER — PHENYLEPHRINE 40 MCG/ML (10ML) SYRINGE FOR IV PUSH (FOR BLOOD PRESSURE SUPPORT)
PREFILLED_SYRINGE | INTRAVENOUS | Status: AC
  Filled 2016-08-11: qty 10

## 2016-08-11 MED ORDER — FENTANYL CITRATE (PF) 100 MCG/2ML IJ SOLN
INTRAMUSCULAR | Status: AC
  Filled 2016-08-11: qty 2

## 2016-08-11 MED ORDER — MIDAZOLAM HCL 2 MG/2ML IJ SOLN
1.0000 mg | INTRAMUSCULAR | Status: DC | PRN
  Administered 2016-08-11: 2 mg via INTRAVENOUS

## 2016-08-11 MED ORDER — EPHEDRINE SULFATE 50 MG/ML IJ SOLN
INTRAMUSCULAR | Status: DC | PRN
  Administered 2016-08-11: 10 mg via INTRAVENOUS

## 2016-08-11 MED ORDER — LIDOCAINE HCL (CARDIAC) 20 MG/ML IV SOLN
INTRAVENOUS | Status: DC | PRN
  Administered 2016-08-11: 100 mg via INTRAVENOUS

## 2016-08-11 MED ORDER — SODIUM CHLORIDE 0.9 % IV SOLN
INTRAVENOUS | Status: DC | PRN
  Administered 2016-08-11: 1000 mL

## 2016-08-11 MED ORDER — PHENYLEPHRINE HCL 10 MG/ML IJ SOLN
INTRAMUSCULAR | Status: DC | PRN
  Administered 2016-08-11 (×2): 40 ug via INTRAVENOUS

## 2016-08-11 MED ORDER — SUCCINYLCHOLINE CHLORIDE 200 MG/10ML IV SOSY
PREFILLED_SYRINGE | INTRAVENOUS | Status: AC
  Filled 2016-08-11: qty 10

## 2016-08-11 MED ORDER — 0.9 % SODIUM CHLORIDE (POUR BTL) OPTIME
TOPICAL | Status: DC | PRN
  Administered 2016-08-11: 1000 mL

## 2016-08-11 MED ORDER — CHLORHEXIDINE GLUCONATE CLOTH 2 % EX PADS: 6.0000 | MEDICATED_PAD | Freq: Once | CUTANEOUS | Status: DC

## 2016-08-11 MED ORDER — MIDAZOLAM HCL 2 MG/2ML IJ SOLN
INTRAMUSCULAR | Status: AC
  Filled 2016-08-11: qty 2

## 2016-08-11 MED ORDER — SUFENTANIL CITRATE 50 MCG/ML IV SOLN
INTRAVENOUS | Status: AC
  Filled 2016-08-11: qty 1

## 2016-08-11 MED ORDER — CELECOXIB 400 MG PO CAPS
400.0000 mg | ORAL_CAPSULE | ORAL | Status: AC
  Administered 2016-08-11: 400 mg via ORAL

## 2016-08-11 MED ORDER — GABAPENTIN 300 MG PO CAPS
ORAL_CAPSULE | ORAL | Status: AC
  Filled 2016-08-11: qty 1

## 2016-08-11 MED ORDER — PHENYLEPHRINE HCL 10 MG/ML IJ SOLN
INTRAMUSCULAR | Status: AC
  Filled 2016-08-11: qty 1

## 2016-08-11 MED ORDER — GABAPENTIN 300 MG PO CAPS
300.0000 mg | ORAL_CAPSULE | ORAL | Status: AC
  Administered 2016-08-11: 300 mg via ORAL

## 2016-08-11 MED ORDER — ACETAMINOPHEN 500 MG PO TABS
ORAL_TABLET | ORAL | Status: AC
  Filled 2016-08-11: qty 2

## 2016-08-11 MED ORDER — CEFAZOLIN SODIUM-DEXTROSE 2-4 GM/100ML-% IV SOLN
2.0000 g | INTRAVENOUS | Status: AC
Start: 2016-08-11 — End: 2016-08-11
  Administered 2016-08-11: 2 g via INTRAVENOUS

## 2016-08-11 MED ORDER — CEFAZOLIN SODIUM-DEXTROSE 2-4 GM/100ML-% IV SOLN
INTRAVENOUS | Status: AC
  Filled 2016-08-11: qty 100

## 2016-08-11 MED ORDER — PROPOFOL 10 MG/ML IV BOLUS
INTRAVENOUS | Status: DC | PRN
  Administered 2016-08-11: 150 mg via INTRAVENOUS

## 2016-08-11 MED ORDER — ROCURONIUM BROMIDE 100 MG/10ML IV SOLN
INTRAVENOUS | Status: DC | PRN
  Administered 2016-08-11: 40 mg via INTRAVENOUS
  Administered 2016-08-11 (×2): 10 mg via INTRAVENOUS

## 2016-08-11 MED ORDER — CELECOXIB 200 MG PO CAPS
ORAL_CAPSULE | ORAL | Status: AC
  Filled 2016-08-11: qty 2

## 2016-08-11 MED ORDER — LACTATED RINGERS IV SOLN
INTRAVENOUS | Status: DC
  Administered 2016-08-11 (×2): via INTRAVENOUS

## 2016-08-11 MED ORDER — LIDOCAINE 2% (20 MG/ML) 5 ML SYRINGE
INTRAMUSCULAR | Status: AC
  Filled 2016-08-11: qty 5

## 2016-08-11 MED ORDER — SCOPOLAMINE 1 MG/3DAYS TD PT72: 1.0000 | MEDICATED_PATCH | Freq: Once | TRANSDERMAL | Status: DC

## 2016-08-11 MED ORDER — SUGAMMADEX SODIUM 200 MG/2ML IV SOLN
INTRAVENOUS | Status: DC | PRN
  Administered 2016-08-11: 125 mg via INTRAVENOUS

## 2016-08-11 MED ORDER — FENTANYL CITRATE (PF) 100 MCG/2ML IJ SOLN: 50.0000 ug | INTRAMUSCULAR | Status: DC | PRN

## 2016-08-11 MED ORDER — SUGAMMADEX SODIUM 200 MG/2ML IV SOLN
INTRAVENOUS | Status: AC
  Filled 2016-08-11: qty 2

## 2016-08-11 MED ORDER — ROCURONIUM BROMIDE 10 MG/ML (PF) SYRINGE
PREFILLED_SYRINGE | INTRAVENOUS | Status: AC
  Filled 2016-08-11: qty 5

## 2016-08-11 SURGICAL SUPPLY — 50 items
BAG DECANTER FOR FLEXI CONT (MISCELLANEOUS) ×4 IMPLANT
BANDAGE ACE 6X5 VEL STRL LF (GAUZE/BANDAGES/DRESSINGS) IMPLANT
BINDER BREAST MEDIUM (GAUZE/BANDAGES/DRESSINGS) ×2 IMPLANT
BLADE SURG 10 STRL SS (BLADE) ×4 IMPLANT
BLADE SURG 15 STRL LF DISP TIS (BLADE) ×3 IMPLANT
BLADE SURG 15 STRL SS (BLADE) ×4
BNDG GAUZE ELAST 4 BULKY (GAUZE/BANDAGES/DRESSINGS) ×8 IMPLANT
CANISTER SUCT 1200ML W/VALVE (MISCELLANEOUS) ×4 IMPLANT
CHLORAPREP W/TINT 26ML (MISCELLANEOUS) ×6 IMPLANT
COVER BACK TABLE 60X90IN (DRAPES) ×4 IMPLANT
COVER MAYO STAND STRL (DRAPES) ×4 IMPLANT
DRAPE TOP ARMCOVERS (MISCELLANEOUS) ×4 IMPLANT
DRAPE U-SHAPE 76X120 STRL (DRAPES) ×4 IMPLANT
DRSG PAD ABDOMINAL 8X10 ST (GAUZE/BANDAGES/DRESSINGS) ×8 IMPLANT
ELECT BLADE 4.0 EZ CLEAN MEGAD (MISCELLANEOUS) ×4
ELECT BLADE 6.5 .24CM SHAFT (ELECTRODE) IMPLANT
ELECT COATED BLADE 2.86 ST (ELECTRODE) ×4 IMPLANT
ELECT REM PT RETURN 9FT ADLT (ELECTROSURGICAL) ×4
ELECTRODE BLDE 4.0 EZ CLN MEGD (MISCELLANEOUS) ×3 IMPLANT
ELECTRODE REM PT RTRN 9FT ADLT (ELECTROSURGICAL) ×3 IMPLANT
GLOVE BIO SURGEON STRL SZ 6 (GLOVE) ×8 IMPLANT
GOWN STRL REUS W/ TWL LRG LVL3 (GOWN DISPOSABLE) ×6 IMPLANT
GOWN STRL REUS W/TWL LRG LVL3 (GOWN DISPOSABLE) ×8
IMPL BREAST P6.3XRND MDRT 560 (Breast) ×1 IMPLANT
IMPL BRST P6.3XRND MDRT 560CC (Breast) ×3 IMPLANT
IMPLANT BREAST GEL 560CC (Breast) ×4 IMPLANT
LIQUID BAND (GAUZE/BANDAGES/DRESSINGS) ×6 IMPLANT
NS IRRIG 1000ML POUR BTL (IV SOLUTION) ×4 IMPLANT
PACK BASIN DAY SURGERY FS (CUSTOM PROCEDURE TRAY) ×4 IMPLANT
PENCIL BUTTON HOLSTER BLD 10FT (ELECTRODE) ×4 IMPLANT
SHEET MEDIUM DRAPE 40X70 STRL (DRAPES) ×8 IMPLANT
SIZER BREAST REUSE GEL 560CC (SIZER) ×4
SIZER BREAST REUSE GEL 580CC (SIZER) ×4
SIZER BRST REUSE GEL 560CC (SIZER) ×2 IMPLANT
SIZER BRST REUSE GEL 580CC (SIZER) ×1 IMPLANT
SLEEVE SCD COMPRESS KNEE MED (MISCELLANEOUS) ×4 IMPLANT
SPONGE LAP 18X18 X RAY DECT (DISPOSABLE) ×8 IMPLANT
STAPLER VISISTAT 35W (STAPLE) ×6 IMPLANT
SUT MNCRL AB 4-0 PS2 18 (SUTURE) ×6 IMPLANT
SUT PDS AB 2-0 CT2 27 (SUTURE) ×2 IMPLANT
SUT VIC AB 3-0 PS1 18 (SUTURE) ×8
SUT VIC AB 3-0 PS1 18XBRD (SUTURE) ×4 IMPLANT
SUT VIC AB 3-0 SH 27 (SUTURE) ×8
SUT VIC AB 3-0 SH 27X BRD (SUTURE) ×5 IMPLANT
SUT VICRYL 4-0 PS2 18IN ABS (SUTURE) ×8 IMPLANT
SYR BULB IRRIGATION 50ML (SYRINGE) ×8 IMPLANT
TOWEL OR 17X24 6PK STRL BLUE (TOWEL DISPOSABLE) ×8 IMPLANT
TUBE CONNECTING 20X1/4 (TUBING) ×10 IMPLANT
UNDERPAD 30X30 (UNDERPADS AND DIAPERS) ×8 IMPLANT
YANKAUER SUCT BULB TIP NO VENT (SUCTIONS) ×4 IMPLANT

## 2016-08-11 NOTE — Anesthesia Postprocedure Evaluation (Signed)
Anesthesia Post Note  Patient: Patricia Chandler  Procedure(s) Performed: Procedure(s) (LRB): REMOVAL OF RIGHT  TISSUE EXPANDER AND PLACEMENT OF IMPLANT (Right) LEFT BREAST MASTOPEXY (Left)  Patient location during evaluation: PACU Anesthesia Type: General Level of consciousness: awake and alert Pain management: pain level controlled Vital Signs Assessment: post-procedure vital signs reviewed and stable Respiratory status: spontaneous breathing, nonlabored ventilation, respiratory function stable and patient connected to nasal cannula oxygen Cardiovascular status: blood pressure returned to baseline and stable Postop Assessment: no signs of nausea or vomiting Anesthetic complications: no       Last Vitals:  Vitals:   08/11/16 1048 08/11/16 1122  BP:  127/81  Pulse: (!) 104 99  Resp: 20 18  Temp:  36.4 C    Last Pain:  Vitals:   08/11/16 1122  TempSrc:   PainSc: 2                  Edric Fetterman EDWARD

## 2016-08-11 NOTE — Transfer of Care (Signed)
Immediate Anesthesia Transfer of Care Note  Patient: Patricia Chandler  Procedure(s) Performed: Procedure(s): REMOVAL OF RIGHT  TISSUE EXPANDER AND PLACEMENT OF IMPLANT (Right) LEFT BREAST MASTOPEXY (Left)  Patient Location: PACU  Anesthesia Type:General  Level of Consciousness: awake, alert  and oriented  Airway & Oxygen Therapy: Patient Spontanous Breathing and Patient connected to face mask oxygen  Post-op Assessment: Report given to RN and Post -op Vital signs reviewed and stable  Post vital signs: Reviewed and stable  Last Vitals:  Vitals:   08/11/16 1007 08/11/16 1008  BP:    Pulse: (!) 117 (!) 113  Resp: (P) 20 12  Temp: (P) 36.7 C     Last Pain:  Vitals:   08/11/16 0640  TempSrc: Oral         Complications: No apparent anesthesia complications

## 2016-08-11 NOTE — Op Note (Signed)
Operative Note   DATE OF OPERATION: 4.24.18  LOCATION: [facility]-outpatient  SURGICAL DIVISION: Plastic Surgery  PREOPERATIVE DIAGNOSES:  1. History breast cancer 2. Acquired absence right breast 3. Asymmetry native and reconstructed breast 4. History augmentation mammaplasty  POSTOPERATIVE DIAGNOSES:  same  PROCEDURE:  1. Removal right tissue expander and placement silicone implant 2. Left mastopexy  SURGEON: Irene Limbo MD MBA  ASSISTANT: none  ANESTHESIA:  General.   EBL: 50 ml  COMPLICATIONS: None immediate.   INDICATIONS FOR PROCEDURE:  The patient, Patricia Chandler, is a 61 y.o. female born on [birth year], is here for second stage breast reconstruction following nipple sparing mastectomy right and immediate expander, acellular dermis reconstruction. Course complicated by necrosis right NAC with excision. She has history prior dual plane saline augmentation and has asymmetry of breasts. Plan mastopexy left breast.   FINDINGS: Left breast implant cavity not entered, mastopexy alone performed. Natrelle Inspira Smooth Round Extra Projection 560 ml implant placed, REF [id number]  DESCRIPTION OF PROCEDURE:  The patient's operative site was marked with the patient in the preoperative area including chest midline, sternal notch, anterior axillary lines, and inframammary folds. The patient was taken to the operating room. SCDs were placed and IV antibiotics were given. The patient's operative site was prepped and draped in a sterile fashion. A time out was performed and all information was confirmed to be correct. Incision made in prior right inframammary fold scar. Incision carried through subcutaneous fascia to capsule and capsule entered. Expander removed. Full incorporation of acellular dermis noted. Capsulotomies performed medially and over lower inner quadrant. Sizer placed.   I then directed attention to left breast. Patient brought to upright sitting position  and tailor tacked for mastopexy. Patient demonstrated asymmetry IMF with left lower than right. There was also lateral displacement implant in supine position; this was not addressed during this surgery as implant capsule not entered. Vertical limbs of mastopexy marked at 8 cm from nipple. Final inverted T mastopexy designed. The patient was returned to supine position and area marked by tailor tacking de epithelialized. Superior pedicle maintained. The NAC was marked at 40 mm diameter. An inferiorly based dermoglandular flap was developed, and this was sutured to chest wall with interrupted 2-0 PDS in order to set and stabilize IMF symmetric with right chest. The patient was examined again in upright position for symmetry. An [place] Extra Projection Smooth Round 560 ml implant selected for right chest.  In supine position, left breast irrigated and hemostasis obtained. Closure completed with interrupted 3-0 vicryl in dermis along vertical and horizontal limbs. NAC inset with interrupted 4-0 vicryl in dermis. Skin closure completed with 4-0 monocryl subcuticular throughout. Over right chest, cavity irrigated with solution containing Ancef, gentamicin, and bacitracin. Hemostasis ensured and cavity irrigated with Betadine. Implant placed and closure completed in layers with 3-0 vicryl for approximation of capsule and superficial fascia, 4-0 vicryl in dermis and 4-0 monocryl subcuticular skin closure. Tissue adhesive applied, followed by dry dressing and breast binder.  The patient was allowed to wake from anesthesia, extubated and taken to the recovery room in satisfactory condition.   SPECIMENS: left mastopexy  DRAINS: none  Irene Limbo, MD [facility] [phone], pin [phone]

## 2016-08-11 NOTE — Anesthesia Procedure Notes (Signed)
Procedure Name: Intubation Date/Time: 08/11/2016 7:36 AM Performed by: Melynda Ripple D Pre-anesthesia Checklist: Patient identified, Emergency Drugs available, Suction available and Patient being monitored Patient Re-evaluated:Patient Re-evaluated prior to inductionOxygen Delivery Method: Circle system utilized Preoxygenation: Pre-oxygenation with 100% oxygen Intubation Type: IV induction Ventilation: Mask ventilation without difficulty Laryngoscope Size: Glidescope and 3 Tube type: Oral Tube size: 6.5 mm Number of attempts: 1 Airway Equipment and Method: Stylet,  Oral airway and Video-laryngoscopy Placement Confirmation: ETT inserted through vocal cords under direct vision,  positive ETCO2 and breath sounds checked- equal and bilateral Secured at: 22 cm Tube secured with: Tape Dental Injury: Teeth and Oropharynx as per pre-operative assessment

## 2016-08-11 NOTE — Interval H&P Note (Signed)
History and Physical Interval Note:  08/11/2016 7:03 AM  Patricia Chandler  has presented today for surgery, with the diagnosis of HISTORY OF BREAST CANCER  The various methods of treatment have been discussed with the patient and family. After consideration of risks, benefits and other options for treatment, the patient has consented to  Procedure(s): REMOVAL OF RIGHT  TISSUE EXPANDER AND PLACEMENT OF IMPLANT (Right) POSSIBLE LEFT BREAST MASTOPEXY (Left) POSSIBLE LEFT BREAST AUGMENTATION REVISION (Left) as a surgical intervention .  The patient's history has been reviewed, patient examined, no change in status, stable for surgery.  I have reviewed the patient's chart and labs.  Questions were answered to the patient's satisfaction.     Ebonie Westerlund

## 2016-08-11 NOTE — Discharge Instructions (Signed)

## 2016-08-12 ENCOUNTER — Encounter (HOSPITAL_BASED_OUTPATIENT_CLINIC_OR_DEPARTMENT_OTHER): Payer: Self-pay | Admitting: Plastic Surgery

## 2016-08-13 ENCOUNTER — Other Ambulatory Visit (HOSPITAL_COMMUNITY): Payer: BLUE CROSS/BLUE SHIELD

## 2016-08-13 ENCOUNTER — Encounter (HOSPITAL_COMMUNITY): Payer: BLUE CROSS/BLUE SHIELD | Admitting: Internal Medicine

## 2016-08-24 MED FILL — LOSARTAN POTASSIUM 25 MG TA: 25 | 30 days supply | Qty: 15 | Fill #1

## 2016-08-25 ENCOUNTER — Telehealth: Payer: Self-pay | Admitting: *Deleted

## 2016-08-25 ENCOUNTER — Other Ambulatory Visit: Payer: Self-pay | Admitting: *Deleted

## 2016-08-25 MED ORDER — ZOLPIDEM TARTRATE 5 MG PO TABS: 5.0000 mg | ORAL_TABLET | Freq: Every evening | ORAL | 1 refills | Status: DC | PRN

## 2016-08-25 MED FILL — ZOLPIDEM TARTRATE 5 MG TAB: 5 | 30 days supply | Qty: 30 | Fill #0

## 2016-08-25 NOTE — Telephone Encounter (Signed)
Scheduled and confirmed appt with dr. Lindi Adie and lab on 5/25 to discuss echo and next step in treatment with hopes of continuing herceptin.

## 2016-09-09 ENCOUNTER — Other Ambulatory Visit: Payer: Self-pay

## 2016-09-09 DIAGNOSIS — C50211 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 female breast: Secondary | ICD-10-CM

## 2016-09-10 ENCOUNTER — Ambulatory Visit (HOSPITAL_BASED_OUTPATIENT_CLINIC_OR_DEPARTMENT_OTHER)
Admission: RE | Admit: 2016-09-10 | Discharge: 2016-09-10 | Disposition: A | Discharge status: Home / Self Care | Payer: BLUE CROSS/BLUE SHIELD | Source: Ambulatory Visit | Attending: Internal Medicine | Admitting: Internal Medicine

## 2016-09-10 ENCOUNTER — Ambulatory Visit (HOSPITAL_COMMUNITY)
Admission: RE | Admit: 2016-09-10 | Discharge: 2016-09-10 | Disposition: A | Discharge status: Home / Self Care | Payer: BLUE CROSS/BLUE SHIELD | Source: Ambulatory Visit | Attending: Family Medicine | Admitting: Family Medicine

## 2016-09-10 ENCOUNTER — Encounter (HOSPITAL_COMMUNITY): Payer: Self-pay | Admitting: Internal Medicine

## 2016-09-10 VITALS — BP 112/78 | HR 72 | Wt 129.5 lb

## 2016-09-10 DIAGNOSIS — D6959 Other secondary thrombocytopenia: Secondary | ICD-10-CM | POA: Diagnosis not present

## 2016-09-10 DIAGNOSIS — Z803 Family history of malignant neoplasm of breast: Secondary | ICD-10-CM | POA: Diagnosis not present

## 2016-09-10 DIAGNOSIS — K219 Gastro-esophageal reflux disease without esophagitis: Secondary | ICD-10-CM | POA: Diagnosis not present

## 2016-09-10 DIAGNOSIS — Z9889 Other specified postprocedural states: Secondary | ICD-10-CM | POA: Diagnosis not present

## 2016-09-10 DIAGNOSIS — F419 Anxiety disorder, unspecified: Secondary | ICD-10-CM | POA: Diagnosis not present

## 2016-09-10 DIAGNOSIS — C50211 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 female breast: Secondary | ICD-10-CM | POA: Diagnosis present

## 2016-09-10 DIAGNOSIS — Z8249 Family history of ischemic heart disease and other diseases of the circulatory system: Secondary | ICD-10-CM | POA: Insufficient documentation

## 2016-09-10 DIAGNOSIS — Z823 Family history of stroke: Secondary | ICD-10-CM | POA: Diagnosis not present

## 2016-09-10 DIAGNOSIS — T451X5A Adverse effect of antineoplastic and immunosuppressive drugs, initial encounter: Secondary | ICD-10-CM | POA: Diagnosis not present

## 2016-09-10 DIAGNOSIS — Z79899 Other long term (current) drug therapy: Secondary | ICD-10-CM | POA: Diagnosis not present

## 2016-09-10 DIAGNOSIS — Z9221 Personal history of antineoplastic chemotherapy: Secondary | ICD-10-CM | POA: Insufficient documentation

## 2016-09-10 MED ORDER — LOSARTAN POTASSIUM 25 MG PO TABS: 12.5000 mg | ORAL_TABLET | Freq: Two times a day (BID) | ORAL | 3 refills | Status: DC

## 2016-09-10 MED FILL — LOSARTAN POTASSIUM 25 MG TA: 25 | 90 days supply | Qty: 90 | Fill #0

## 2016-09-10 NOTE — Patient Instructions (Signed)
INCREASE Losartan to 12.5 mg (1/2 tab) twice daily.  Follow up 6-8 weeks with echocardiogram and Dr. Haroldine Laws appointment.

## 2016-09-10 NOTE — Progress Notes (Signed)
  Echocardiogram 2D Echocardiogram has been performed.  Matilde Bash 09/10/2016, 1:51 PM

## 2016-09-10 NOTE — Progress Notes (Signed)
CARDIO-ONCOLOGY CLINIC NOTE  Patient Care Team: [name], MD as PCP - General (Family Medicine)  Referring oncologist: Lindi Adie   HISTORY OF PRESENTING ILLNESS:  Patricia Chandler is a 61 y/o woman with no cardiac history undergoing treatment for right breast cancer.   SUMMARY OF ONCOLOGIC HISTORY:   Breast cancer of upper-inner quadrant of right female breast ([place])   12/05/2015 Mammogram    Right breast mass 2:00 4 cm from nipple: 1.7 cm, T1c N0 stage IA; right breast 2:00 6 cm from nipple 1.3 cm mass, 7 mm satellite nodule between the 2 masses, T1 cN0 stage IA      12/06/2015 Initial Diagnosis    Right breast biopsy 2:00 6 cm from nipple: Grade 2-3 invasive ductal cancer, ER 90%, PR 2%, Ki-67 80%, HER-2 positive ratio 2.07; right biopsy 2:00 4 cm from nipple: IDC with DCIS, ER 95%, PR 10%, Ki-67 90%, HER-2 negative ratio 1.27      12/18/2015 Breast MRI    3 enhancing masses in the upper inner quadrant right breast spanning an area 3.7 cm (1.7 cm, 1 cm, 1.4 cm) no lymph node enlargement      12/31/2015 - 04/15/2016 Neo-Adjuvant Chemotherapy    TCH Perjeta 6 cycles followed by Herceptin, Perjeta maintenance for 1 year      01/08/2016 - 01/12/2016 Hospital Admission    Neutropenic fever hospitalization (because patient did not receive Neulasta with cycle 1)      01/10/2016 Miscellaneous    Genetic testing was normal did not reveal any mutations      04/16/2016 Breast MRI    Near CR to therapy. Previous 2 biopsied massses are no longer seen. Third satellite lesion is smaller from 10 mm to 5.3 mm, no abnormal LN.      05/19/2016 Surgery    Right mastectomy: IDC grade 3, 1.3 cm, low-grade DCIS, LVIDS present, margins negative, 0/1 lymph node negative, ypT1CypN0 stage IA; repeat HER-2 negative ratio 1.41      05/19/2016 Surgery    Right breast reconstruction with tissue expander. Acellular dermis for breast reconstruction (Dr.Thimappa)       Subjective   Finished  chemo on 04/15/16. Has been getting herceptin and perjeta. S/p mastectomy with reconstruction in 1/18.   She returns for cardio-onc follow up. Last visit herceptin was held due to reduced EF. Started on losartan at that time. Initially tried losartan 25 mg daily but got very lightheaded. Now on losartan 12.5 qhs. Tolerating ok. Overall feeling ok. Denies SOB/PND/Orthopnea.   Echo (12/26/15) EF 60-65% GLS -22.5% Lateral s' 11.4 cm/s.  Echo 04/02/16 EF 60-65% GLS - 21.3% Lateral s' 10.8 cm/s  Echo 07/01/16 EF 50-55% LS 9.4 cm/s GLS -19.2% ECHO 09/10/2016: EF 60-65% LS 10.4 GLS -21. (reviewed personally)  MEDICAL HISTORY:  Past Medical History:  Diagnosis Date  . Anemia    due to chemo  . Anxiety   . Cardiomyopathy ([place])    mild, per cardiology note; due to Herceptin  . Dental crowns present   . Family history of adverse reaction to anesthesia    pt's daughter has hx. of post-op nausea  . GERD (gastroesophageal reflux disease)   . History of breast cancer 2017   right  . History of chemotherapy    finished chemo 04/15/2016  . Rash 08/04/2016   under right arm - states recent tick bite; to see MD 08/05/2016  . Runny nose 06/06/2016   clear drainage, per pt.    SURGICAL HISTORY:  Past Surgical History:  Procedure Laterality Date  . BREAST ENHANCEMENT SURGERY Bilateral 2005  . BREAST RECONSTRUCTION WITH PLACEMENT OF TISSUE EXPANDER AND FLEX HD (ACELLULAR HYDRATED DERMIS) Right 05/19/2016   Procedure: RIGHT BREAST RECONSTRUCTION WITH PLACEMENT OF TISSUE EXPANDER AND  ALLODERM.;  Surgeon: Irene Limbo, MD;  Location: [place];  Service: Plastics;  Laterality: Right;  . COLONOSCOPY    . COLONOSCOPY WITH PROPOFOL  10/02/2014  . DEBRIDEMENT AND CLOSURE WOUND Right 06/08/2016   Procedure: DEBRIDEMENT RIGHT MASTECTOMY FLAP, TISSUE EXPANSION OF RIGHT CHEST;  Surgeon: Irene Limbo, MD;  Location: [place];  Service: Plastics;  Laterality: Right;  . MASTOPEXY Left 08/11/2016    Procedure: LEFT BREAST MASTOPEXY;  Surgeon: Irene Limbo, MD;  Location: [place];  Service: Plastics;  Laterality: Left;  . NIPPLE SPARING MASTECTOMY/SENTINAL LYMPH NODE BIOPSY/RECONSTRUCTION/PLACEMENT OF TISSUE EXPANDER Right 05/19/2016   Procedure: RIGHT NIPPLE SPARING MASTECTOMY WITH SENTINAL LYMPH NODE BIOPSY WITH BLUE DYE INJECTION;  Surgeon: Rolm Bookbinder, MD;  Location: [place];  Service: General;  Laterality: Right;  . PORTACATH PLACEMENT N/A 12/30/2015   Procedure: INSERTION PORT-A-CATH WITH [place];  Surgeon: Rolm Bookbinder, MD;  Location: [place];  Service: General;  Laterality: N/A;  . REMOVAL OF TISSUE EXPANDER AND PLACEMENT OF IMPLANT Right 08/11/2016   Procedure: REMOVAL OF RIGHT  TISSUE EXPANDER AND PLACEMENT OF IMPLANT;  Surgeon: Irene Limbo, MD;  Location: [place];  Service: Plastics;  Laterality: Right;    SOCIAL HISTORY: Social History   Social History  . Marital status: Married    Spouse name: N/A  . Number of children: 2  . Years of education: N/A   Occupational History  . medical receptionist    Social History Main Topics  . Smoking status: Never Smoker  . Smokeless tobacco: Never Used  . Alcohol use Yes     Comment: occasionally  . Drug use: No  . Sexual activity: Not on file   Other Topics Concern  . Not on file   Social History Narrative   Married 1978 (Al). 2 girls 33 and 36 in 2018. [name] lives in [place], [place] with 6 yo son (single right now). and [name] in [place], [place]--> now [place] with husband and 50 year old daughter (born with heart defect with missing valve and initially not closed VSD--still weighs heavily on family- likely will have to be repaired).       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    Graduated from [employer]. [employer] prior.       Hobbies: gardening, decorating,  sewing, enjoys creating    FAMILY HISTORY: Family History  Problem Relation Age of Onset  . Breast cancer Maternal Aunt        dx in her 55s  . Stroke Father        late 31s  . Hypertension Father   . Pulmonary fibrosis Mother   . Breast cancer Paternal Aunt 78  . Cancer Paternal Grandmother        abdominal; cervical vs uterine dx in her 57s  . Parkinson's disease Maternal Grandfather   . Anesthesia problems Daughter        post-op nausea    ALLERGIES:  has No Known Allergies.  MEDICATIONS:  Current Outpatient Prescriptions  Medication Sig Dispense Refill  . ALPRAZolam (XANAX) 0.5 MG tablet Take 0.5 mg by mouth at bedtime  as needed for anxiety. Takes 1/3 of a tablet    . letrozole (FEMARA) 2.5 MG tablet Take 2.5 mg by mouth daily.    Marland Kitchen losartan (COZAAR) 25 MG tablet Take 0.5 tablets (12.5 mg total) by mouth at bedtime. 15 tablet 3  . pantoprazole (PROTONIX) 40 MG tablet Take 40 mg by mouth daily.   0  . zolpidem (AMBIEN) 5 MG tablet Take 1 tablet (5 mg total) by mouth at bedtime as needed for sleep. 30 tablet 1   No current facility-administered medications for this encounter.     Vitals:   09/10/16 1405  BP: 112/78  Pulse: 72   Filed Weights   09/10/16 1405  Weight: 129 lb 8 oz (58.7 kg)   General:  Well appearing. No resp difficulty HEENT: normal Neck: supple. no JVD. R porta cath. Carotids 2+ bilat; no bruits. No lymphadenopathy or thryomegaly appreciated. Cor: PMI nondisplaced. Regular rate & rhythm. No rubs, gallops or murmurs. Lungs: clear Abdomen: soft, nontender, nondistended. No hepatosplenomegaly. No bruits or masses. Good bowel sounds. Extremities: no cyanosis, clubbing, rash, edema Neuro: alert & orientedx3, cranial nerves grossly intact. moves all 4 extremities w/o difficulty. Affect pleasant   LABORATORY DATA:  I have reviewed the data as listed Lab Results  Component Value Date   WBC 3.8 (L) 06/16/2016   HGB 9.1 (L) 06/16/2016   HCT 27.3 (L)  06/16/2016   MCV 96.1 06/16/2016   PLT 223 06/16/2016   Lab Results  Component Value Date   NA 136 06/16/2016   K 4.2 06/16/2016   CL 111 01/12/2016   CO2 23 06/16/2016     ASSESSMENT AND PLAN:   1. Breast cancer of upper-inner quadrant of right female breast ([place]) -- ER 90%, PR 2%, Ki-67 80%, HER-2 positive ratio 2.07;  -- Clinical stage: T2N0 (stage 2A) --Todays ECHO was reviewed by Dr Vaughan Browner. EF back to normal ok to resume herceptin.   Follow up in 6 weeks with an ECHO.  Amy Clegg NP-C  2:17 PM  Patient seen and examined with Darrick Grinder, NP. We discussed all aspects of the encounter. I agree with the assessment and plan as stated above.   Echo reviewed personally. All parameters completely back to baseline. Will restart hereptin. Increase losartan to 12.5 bid. Repeat echo after 3 more doses of herceptin.   Glori Bickers, MD  2:45 PM

## 2016-09-11 ENCOUNTER — Ambulatory Visit (HOSPITAL_BASED_OUTPATIENT_CLINIC_OR_DEPARTMENT_OTHER): Payer: BLUE CROSS/BLUE SHIELD

## 2016-09-11 ENCOUNTER — Other Ambulatory Visit (HOSPITAL_BASED_OUTPATIENT_CLINIC_OR_DEPARTMENT_OTHER): Payer: BLUE CROSS/BLUE SHIELD

## 2016-09-11 ENCOUNTER — Ambulatory Visit (HOSPITAL_BASED_OUTPATIENT_CLINIC_OR_DEPARTMENT_OTHER): Payer: BLUE CROSS/BLUE SHIELD | Admitting: Hematology and Oncology

## 2016-09-11 ENCOUNTER — Encounter: Payer: Self-pay | Admitting: Hematology and Oncology

## 2016-09-11 ENCOUNTER — Telehealth: Payer: Self-pay | Admitting: Hematology and Oncology

## 2016-09-11 ENCOUNTER — Ambulatory Visit: Payer: BLUE CROSS/BLUE SHIELD

## 2016-09-11 DIAGNOSIS — C50211 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 female breast: Secondary | ICD-10-CM

## 2016-09-11 DIAGNOSIS — Z5112 Encounter for antineoplastic immunotherapy: Secondary | ICD-10-CM

## 2016-09-11 DIAGNOSIS — Z170 Estrogen receptor positive status [ER+]: Secondary | ICD-10-CM | POA: Diagnosis not present

## 2016-09-11 DIAGNOSIS — Z95828 Presence of other vascular implants and grafts: Secondary | ICD-10-CM

## 2016-09-11 LAB — COMPREHENSIVE METABOLIC PANEL
ALT: 14 U/L (ref 0–55)
ANION GAP: 8 meq/L (ref 3–11)
AST: 18 U/L (ref 5–34)
Albumin: 3.9 g/dL (ref 3.5–5.0)
Alkaline Phosphatase: 60 U/L (ref 40–150)
BILIRUBIN TOTAL: 0.52 mg/dL (ref 0.20–1.20)
BUN: 25.9 mg/dL (ref 7.0–26.0)
CHLORIDE: 105 meq/L (ref 98–109)
CO2: 25 meq/L (ref 22–29)
Calcium: 9.3 mg/dL (ref 8.4–10.4)
Creatinine: 0.8 mg/dL (ref 0.6–1.1)
EGFR: 76 mL/min/{1.73_m2} — AB (ref >90)
Glucose: 78 mg/dl (ref 70–140)
Potassium: 4.1 mEq/L (ref 3.5–5.1)
Sodium: 138 mEq/L (ref 136–145)
Total Protein: 6.2 g/dL — ABNORMAL LOW (ref 6.4–8.3)

## 2016-09-11 LAB — CBC WITH DIFFERENTIAL/PLATELET
BASO%: 0.7 % (ref 0.0–2.0)
Basophils Absolute: 0 10*3/uL (ref 0.0–0.1)
EOS%: 1.9 % (ref 0.0–7.0)
Eosinophils Absolute: 0.1 10*3/uL (ref 0.0–0.5)
HCT: 34.5 % — ABNORMAL LOW (ref 34.8–46.6)
HGB: 11.8 g/dL (ref 11.6–15.9)
LYMPH%: 34.6 % (ref 14.0–49.7)
MCH: 30.7 pg (ref 25.1–34.0)
MCHC: 34.3 g/dL (ref 31.5–36.0)
MCV: 89.6 fL (ref 79.5–101.0)
MONO#: 0.5 10*3/uL (ref 0.1–0.9)
MONO%: 7.9 % (ref 0.0–14.0)
NEUT#: 3.2 10*3/uL (ref 1.5–6.5)
NEUT%: 54.9 % (ref 38.4–76.8)
PLATELETS: 223 10*3/uL (ref 145–400)
RBC: 3.85 10*6/uL (ref 3.70–5.45)
RDW: 14.3 % (ref 11.2–14.5)
WBC: 5.8 10*3/uL (ref 3.9–10.3)
lymph#: 2 10*3/uL (ref 0.9–3.3)

## 2016-09-11 MED ORDER — ACETAMINOPHEN 325 MG PO TABS
ORAL_TABLET | ORAL | Status: AC
  Filled 2016-09-11: qty 2

## 2016-09-11 MED ORDER — DIPHENHYDRAMINE HCL 25 MG PO CAPS
25.0000 mg | ORAL_CAPSULE | Freq: Once | ORAL | Status: AC
  Administered 2016-09-11: 25 mg via ORAL

## 2016-09-11 MED ORDER — SODIUM CHLORIDE 0.9% FLUSH
10.0000 mL | INTRAVENOUS | Status: DC | PRN
  Administered 2016-09-11: 10 mL
  Filled 2016-09-11: qty 10

## 2016-09-11 MED ORDER — HEPARIN SOD (PORK) LOCK FLUSH 100 UNIT/ML IV SOLN
500.0000 [IU] | Freq: Once | INTRAVENOUS | Status: AC | PRN
  Administered 2016-09-11: 500 [IU]
  Filled 2016-09-11: qty 5

## 2016-09-11 MED ORDER — DIPHENHYDRAMINE HCL 25 MG PO CAPS
ORAL_CAPSULE | ORAL | Status: AC
  Filled 2016-09-11: qty 1

## 2016-09-11 MED ORDER — ACETAMINOPHEN 325 MG PO TABS
650.0000 mg | ORAL_TABLET | Freq: Once | ORAL | Status: AC
  Administered 2016-09-11: 650 mg via ORAL

## 2016-09-11 MED ORDER — SODIUM CHLORIDE 0.9 % IV SOLN
Freq: Once | INTRAVENOUS | Status: AC
  Administered 2016-09-11: 09:00:00 via INTRAVENOUS

## 2016-09-11 MED ORDER — PERTUZUMAB CHEMO INJECTION 420 MG/14ML
420.0000 mg | Freq: Once | INTRAVENOUS | Status: AC
  Administered 2016-09-11: 420 mg via INTRAVENOUS
  Filled 2016-09-11: qty 14

## 2016-09-11 MED ORDER — TRASTUZUMAB CHEMO 150 MG IV SOLR
6.0000 mg/kg | Freq: Once | INTRAVENOUS | Status: AC
  Administered 2016-09-11: 378 mg via INTRAVENOUS
  Filled 2016-09-11: qty 18

## 2016-09-11 MED ORDER — SODIUM CHLORIDE 0.9 % IJ SOLN
10.0000 mL | INTRAMUSCULAR | Status: DC | PRN
  Administered 2016-09-11: 10 mL via INTRAVENOUS
  Filled 2016-09-11: qty 10

## 2016-09-11 NOTE — Progress Notes (Signed)
Patient Care Team: [name], MD as PCP - General (Family Medicine)  DIAGNOSIS:  Encounter Diagnosis  Name Primary?  . 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 breast in female, estrogen receptor positive ([place])     SUMMARY OF ONCOLOGIC HISTORY:   Breast cancer of upper-inner quadrant of right female breast ([place])   12/05/2015 Mammogram    Right breast mass 2:00 4 cm from nipple: 1.7 cm, T1c N0 stage IA; right breast 2:00 6 cm from nipple 1.3 cm mass, 7 mm satellite nodule between the 2 masses, T1 cN0 stage IA      12/06/2015 Initial Diagnosis    Right breast biopsy 2:00 6 cm from nipple: Grade 2-3 invasive ductal cancer, ER 90%, PR 2%, Ki-67 80%, HER-2 positive ratio 2.07; right biopsy 2:00 4 cm from nipple: IDC with DCIS, ER 95%, PR 10%, Ki-67 90%, HER-2 negative ratio 1.27      12/18/2015 Breast MRI    3 enhancing masses in the upper inner quadrant right breast spanning an area 3.7 cm (1.7 cm, 1 cm, 1.4 cm) no lymph node enlargement      12/31/2015 - 04/15/2016 Neo-Adjuvant Chemotherapy    TCH Perjeta 6 cycles followed by Herceptin, Perjeta maintenance for 1 year      01/08/2016 - 01/12/2016 Hospital Admission    Neutropenic fever hospitalization (because patient did not receive Neulasta with cycle 1)      01/10/2016 Miscellaneous    Genetic testing was normal did not reveal any mutations      04/16/2016 Breast MRI    Near CR to therapy. Previous 2 biopsied massses are no longer seen. Third satellite lesion is smaller from 10 mm to 5.3 mm, no abnormal LN.      05/19/2016 Surgery    Right mastectomy: IDC grade 3, 1.3 cm, low-grade DCIS, LVIDS present, margins negative, 0/1 lymph node negative, ypT1CypN0 stage IA; repeat HER-2 negative ratio 1.41      05/19/2016 Surgery    Right breast reconstruction with tissue expander. Acellular dermis for breast reconstruction (Dr.Thimappa)        CHIEF COMPLIANT: Resuming maintenance Herceptin and  Perjeta  INTERVAL HISTORY: Patricia Chandler is a 61 year old with above-mentioned history of from right breast cancer who was treated with neoadjuvant chemotherapy with [place] and had right mastectomy followed by breast reconstruction. She was on Herceptin Perjeta maintenance but the maintenance therapy had to be stopped because of cardiac decline in ejection fraction. Most recent cardiac function evaluation by cardiology showed normal function and he she is here today to resume Herceptin and Perjeta. Her breast reconstruction had been completed in April. She denies any new symptoms or concerns.  REVIEW OF SYSTEMS:   Constitutional: Denies fevers, chills or abnormal weight loss Eyes: Denies blurriness of vision Ears, nose, mouth, throat, and face: Denies mucositis or sore throat Respiratory: Denies cough, dyspnea or wheezes Cardiovascular: Denies palpitation, chest discomfort Gastrointestinal:  Denies nausea, heartburn or change in bowel habits Skin: Denies abnormal skin rashes Lymphatics: Denies new lymphadenopathy or easy bruising Neurological:Denies numbness, tingling or new weaknesses Behavioral/Psych: Mood is stable, no new changes  Extremities: No lower extremity edema Breast: Right breast reconstruction has been completed All other systems were reviewed with the patient and are negative.  I have reviewed the past medical history, past surgical history, social history and family history with the patient and they are unchanged from previous note.  ALLERGIES:  has No Known Allergies.  MEDICATIONS:  Current Outpatient Prescriptions  Medication Sig Dispense Refill  . ALPRAZolam (XANAX) 0.5 MG tablet Take 0.5 mg by mouth at bedtime as needed for anxiety. Takes 1/3 of a tablet    . letrozole (FEMARA) 2.5 MG tablet Take 2.5 mg by mouth daily.    Marland Kitchen losartan (COZAAR) 25 MG tablet Take 0.5 tablets (12.5 mg total) by mouth 2 (two) times daily. 90 tablet 3  . pantoprazole (PROTONIX) 40  MG tablet Take 40 mg by mouth daily.   0  . zolpidem (AMBIEN) 5 MG tablet Take 1 tablet (5 mg total) by mouth at bedtime as needed for sleep. 30 tablet 1   No current facility-administered medications for this visit.     PHYSICAL EXAMINATION: ECOG PERFORMANCE STATUS: 1 - Symptomatic but completely ambulatory  Vitals:   09/11/16 0812  BP: 99/74  Pulse: 73  Resp: 18  Temp: 97.8 F (36.6 C)   Filed Weights   09/11/16 0812  Weight: 129 lb 1.6 oz (58.6 kg)    GENERAL:alert, no distress and comfortable SKIN: skin color, texture, turgor are normal, no rashes or significant lesions EYES: normal, Conjunctiva are pink and non-injected, sclera clear OROPHARYNX:no exudate, no erythema and lips, buccal mucosa, and tongue normal  NECK: supple, thyroid normal size, non-tender, without nodularity LYMPH:  no palpable lymphadenopathy in the cervical, axillary or inguinal LUNGS: clear to auscultation and percussion with normal breathing effort HEART: regular rate & rhythm and no murmurs and no lower extremity edema ABDOMEN:abdomen soft, non-tender and normal bowel sounds MUSCULOSKELETAL:no cyanosis of digits and no clubbing  NEURO: alert & oriented x 3 with fluent speech, no focal motor/sensory deficits EXTREMITIES: No lower extremity edema  LABORATORY DATA:  I have reviewed the data as listed   Chemistry      Component Value Date/Time   NA 138 09/11/2016 0746   K 4.1 09/11/2016 0746   CL 111 01/12/2016 0520   CO2 25 09/11/2016 0746   BUN 25.9 09/11/2016 0746   CREATININE 0.8 09/11/2016 0746      Component Value Date/Time   CALCIUM 9.3 09/11/2016 0746   ALKPHOS 60 09/11/2016 0746   AST 18 09/11/2016 0746   ALT 14 09/11/2016 0746   BILITOT 0.52 09/11/2016 0746       Lab Results  Component Value Date   WBC 5.8 09/11/2016   HGB 11.8 09/11/2016   HCT 34.5 (L) 09/11/2016   MCV 89.6 09/11/2016   PLT 223 09/11/2016   NEUTROABS 3.2 09/11/2016    ASSESSMENT & PLAN:  Breast  cancer of upper-inner quadrant of right female breast ([facility]) Right breast biopsy 2:00 6 cm from nipple: Grade 2-3 invasive ductal cancer, ER 90%, PR 2%, Ki-67 80%, HER-2 positive ratio 2.07; right biopsy 2:00 4 cm from nipple: IDC with DCIS, ER 95%, PR 10%, Ki-67 90%, HER-2 negative ratio 1.27 Breast MRI08/30/2017: 3 enhancing masses in the upper inner quadrant right breast spanning an area 3.7 cm (1.7 cm, 1 cm, 1.4 cm) no lymph node enlargement Clinical stage: T2N0 (stage 2A)  Treatment plan: 1. Genetic counseling: Normal did not reveal any abnormal mutations 2. Neoadjuvant chemotherapy. [facility] Perjeta 6 cycles followed by Herceptin And Perjeta maintenance for one year) from 12/31/15 to 04/15/16 3. Right mastectomy with reconstruction: 05/19/2016:Right mastectomy: IDC grade 3, 1.3 cm, low-grade DCIS, LVIDS present, margins negative, 0/1 lymph node negative, ypT1CypN0 stage IA; repeat HER-2 negative ratio 1.41 4. Adjuvant antiestrogen therapy With letrozole 2.5 mg daily to be started March 2018 -------------------------------------------------------------------------------------------------------------------------------------------------- Current treatment: 1. Resume maintenance Herceptin  and Perjeta because of improvement in cardiac ejection fraction 2. antiestrogen therapy with letrozole 2.5 mg daily started March 2018 3. Consideration for Neratinib after conclusion of Herceptin and Perjeta   Cardiologist monitoring closely with echocardiograms for ejection fraction.  Patient is very anxious to get a scan after the conclusion of Herceptin and Perjeta. We will obtain a CT chest abdomen pelvis after completion of treatment.  I spent 25 minutes talking to the patient of which more than half was spent in counseling and coordination of care.  No orders of the defined types were placed in this encounter.  The patient has a good understanding of the overall plan. she agrees with it. she will call  with any problems that may develop before the next visit here.   Rulon Eisenmenger, MD 09/11/16

## 2016-09-11 NOTE — Assessment & Plan Note (Signed)
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 Genetic counseling: Normal did not reveal any abnormal mutations 2. Neoadjuvant chemotherapy. [facility] Perjeta 6 cycles followed by Herceptin And Perjeta maintenance for one year) from 12/31/15 to 04/15/16 3. Right mastectomy with reconstruction: 05/19/2016:Right mastectomy: IDC grade 3, 1.3 cm, low-grade DCIS, LVIDS present, margins negative, 0/1 lymph node negative, ypT1CypN0 stage IA; repeat HER-2 negative ratio 1.41 4. Adjuvant antiestrogen therapy With letrozole 2.5 mg daily to be started March 2018 -------------------------------------------------------------------------------------------------------------------------------------------------- Current treatment: 1. Resume maintenance Herceptin and Perjeta because of improvement in cardiac ejection fraction 2. antiestrogen therapy with letrozole 2.5 mg daily started March 2018 3. Consideration for Neratinib after conclusion of Herceptin and Perjeta   Cardiologist monitoring closely with echocardiograms for ejection fraction.

## 2016-09-11 NOTE — Telephone Encounter (Signed)
Appointments complete per 5/25 los. Patient will get print out in infusion. Appointments scheduled according to dates per patient and q3w. Per 5/25 los ok to adjust dates.

## 2016-09-25 MED FILL — LETROZOLE 2.5 MG TABLET: 2.5 | 90 days supply | Qty: 90 | Fill #1

## 2016-09-29 ENCOUNTER — Ambulatory Visit (HOSPITAL_BASED_OUTPATIENT_CLINIC_OR_DEPARTMENT_OTHER): Payer: BLUE CROSS/BLUE SHIELD

## 2016-09-29 ENCOUNTER — Other Ambulatory Visit (HOSPITAL_BASED_OUTPATIENT_CLINIC_OR_DEPARTMENT_OTHER): Payer: BLUE CROSS/BLUE SHIELD

## 2016-09-29 VITALS — BP 96/65 | HR 74 | Temp 98.1°F | Resp 18

## 2016-09-29 DIAGNOSIS — Z5112 Encounter for antineoplastic immunotherapy: Secondary | ICD-10-CM

## 2016-09-29 DIAGNOSIS — C50211 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 female breast: Secondary | ICD-10-CM | POA: Diagnosis not present

## 2016-09-29 DIAGNOSIS — Z170 Estrogen receptor positive status [ER+]: Secondary | ICD-10-CM

## 2016-09-29 LAB — CBC WITH DIFFERENTIAL/PLATELET
BASO%: 1.1 % (ref 0.0–2.0)
Basophils Absolute: 0.1 10*3/uL (ref 0.0–0.1)
EOS%: 2.6 % (ref 0.0–7.0)
Eosinophils Absolute: 0.1 10*3/uL (ref 0.0–0.5)
HEMATOCRIT: 34.3 % — AB (ref 34.8–46.6)
HGB: 11.5 g/dL — ABNORMAL LOW (ref 11.6–15.9)
LYMPH#: 2 10*3/uL (ref 0.9–3.3)
LYMPH%: 38.6 % (ref 14.0–49.7)
MCH: 29.9 pg (ref 25.1–34.0)
MCHC: 33.5 g/dL (ref 31.5–36.0)
MCV: 89.2 fL (ref 79.5–101.0)
MONO#: 0.4 10*3/uL (ref 0.1–0.9)
MONO%: 7.4 % (ref 0.0–14.0)
NEUT%: 50.3 % (ref 38.4–76.8)
NEUTROS ABS: 2.6 10*3/uL (ref 1.5–6.5)
PLATELETS: 196 10*3/uL (ref 145–400)
RBC: 3.84 10*6/uL (ref 3.70–5.45)
RDW: 14.5 % (ref 11.2–14.5)
WBC: 5.2 10*3/uL (ref 3.9–10.3)

## 2016-09-29 LAB — COMPREHENSIVE METABOLIC PANEL
ALT: 18 U/L (ref 0–55)
ANION GAP: 9 meq/L (ref 3–11)
AST: 21 U/L (ref 5–34)
Albumin: 3.6 g/dL (ref 3.5–5.0)
Alkaline Phosphatase: 56 U/L (ref 40–150)
BUN: 19.7 mg/dL (ref 7.0–26.0)
CALCIUM: 9.3 mg/dL (ref 8.4–10.4)
CHLORIDE: 106 meq/L (ref 98–109)
CO2: 24 meq/L (ref 22–29)
CREATININE: 0.8 mg/dL (ref 0.6–1.1)
EGFR: 76 mL/min/{1.73_m2} — ABNORMAL LOW (ref >90)
Glucose: 86 mg/dl (ref 70–140)
Potassium: 4.1 mEq/L (ref 3.5–5.1)
Sodium: 139 mEq/L (ref 136–145)
TOTAL PROTEIN: 6 g/dL — AB (ref 6.4–8.3)
Total Bilirubin: 0.47 mg/dL (ref 0.20–1.20)

## 2016-09-29 MED ORDER — SODIUM CHLORIDE 0.9 % IV SOLN
Freq: Once | INTRAVENOUS | Status: AC
  Administered 2016-09-29: 08:00:00 via INTRAVENOUS

## 2016-09-29 MED ORDER — SODIUM CHLORIDE 0.9 % IV SOLN
6.0000 mg/kg | Freq: Once | INTRAVENOUS | Status: AC
  Administered 2016-09-29: 378 mg via INTRAVENOUS
  Filled 2016-09-29: qty 18

## 2016-09-29 MED ORDER — HEPARIN SOD (PORK) LOCK FLUSH 100 UNIT/ML IV SOLN
500.0000 [IU] | Freq: Once | INTRAVENOUS | Status: AC | PRN
  Administered 2016-09-29: 500 [IU]
  Filled 2016-09-29: qty 5

## 2016-09-29 MED ORDER — ACETAMINOPHEN 325 MG PO TABS
ORAL_TABLET | ORAL | Status: AC
  Filled 2016-09-29: qty 2

## 2016-09-29 MED ORDER — SODIUM CHLORIDE 0.9% FLUSH
10.0000 mL | INTRAVENOUS | Status: DC | PRN
  Administered 2016-09-29: 10 mL
  Filled 2016-09-29: qty 10

## 2016-09-29 MED ORDER — DIPHENHYDRAMINE HCL 25 MG PO CAPS
25.0000 mg | ORAL_CAPSULE | Freq: Once | ORAL | Status: AC
  Administered 2016-09-29: 25 mg via ORAL

## 2016-09-29 MED ORDER — SODIUM CHLORIDE 0.9 % IV SOLN
420.0000 mg | Freq: Once | INTRAVENOUS | Status: AC
  Administered 2016-09-29: 420 mg via INTRAVENOUS
  Filled 2016-09-29: qty 14

## 2016-09-29 MED ORDER — DIPHENHYDRAMINE HCL 25 MG PO CAPS
ORAL_CAPSULE | ORAL | Status: AC
  Filled 2016-09-29: qty 1

## 2016-09-29 MED ORDER — ACETAMINOPHEN 325 MG PO TABS
650.0000 mg | ORAL_TABLET | Freq: Once | ORAL | Status: AC
  Administered 2016-09-29: 650 mg via ORAL

## 2016-09-29 NOTE — Patient Instructions (Signed)
Cancer Center Discharge Instructions for Patients Receiving Chemotherapy  Today you received the following chemotherapy agents Herceptin/Perjeta To help prevent nausea and vomiting after your treatment, we encourage you to take your nausea medication as prescribed.   If you develop nausea and vomiting that is not controlled by your nausea medication, call the clinic.   BELOW ARE SYMPTOMS THAT SHOULD BE REPORTED IMMEDIATELY:  *FEVER GREATER THAN 100.5 F  *CHILLS WITH OR WITHOUT FEVER  NAUSEA AND VOMITING THAT IS NOT CONTROLLED WITH YOUR NAUSEA MEDICATION  *UNUSUAL SHORTNESS OF BREATH  *UNUSUAL BRUISING OR BLEEDING  TENDERNESS IN MOUTH AND THROAT WITH OR WITHOUT PRESENCE OF ULCERS  *URINARY PROBLEMS  *BOWEL PROBLEMS  UNUSUAL RASH Items with * indicate a potential emergency and should be followed up as soon as possible.  Feel free to call the clinic you have any questions or concerns. The clinic phone number is [phone].  Please show the CHEMO ALERT CARD at check-in to the Emergency Department and triage nurse.  

## 2016-09-30 ENCOUNTER — Encounter (HOSPITAL_COMMUNITY): Payer: Self-pay

## 2016-10-06 ENCOUNTER — Other Ambulatory Visit: Payer: Self-pay | Admitting: Hematology and Oncology

## 2016-10-07 MED FILL — ZOLPIDEM TARTRATE 5 MG TAB: 5 | 30 days supply | Qty: 30 | Fill #1

## 2016-10-08 ENCOUNTER — Ambulatory Visit (INDEPENDENT_AMBULATORY_CARE_PROVIDER_SITE_OTHER): Payer: BLUE CROSS/BLUE SHIELD | Admitting: Family Medicine

## 2016-10-08 ENCOUNTER — Other Ambulatory Visit: Payer: Self-pay

## 2016-10-08 ENCOUNTER — Encounter: Payer: Self-pay | Admitting: Family Medicine

## 2016-10-08 DIAGNOSIS — M999 Biomechanical lesion, unspecified: Secondary | ICD-10-CM | POA: Diagnosis not present

## 2016-10-08 DIAGNOSIS — M5030 Other cervical disc degeneration, unspecified cervical region: Secondary | ICD-10-CM | POA: Diagnosis not present

## 2016-10-08 MED ORDER — VITAMIN D (ERGOCALCIFEROL) 1.25 MG (50000 UNIT) PO CAPS: 50000.0000 [IU] | ORAL_CAPSULE | ORAL | 0 refills | Status: DC

## 2016-10-08 MED FILL — VIT D2 1.25 MG (50,000 UNIT: 1.25 MG | 84 days supply | Qty: 12 | Fill #0

## 2016-10-08 NOTE — Assessment & Plan Note (Signed)
Stable but likely some of this.  Muscle imbalances.  Patient learn home exercises today. We discussed posture and ergonomics. Discussed over-the-counter medications. Once with the vitamin D for strength and endurance. Follow-up again in 4 weeks. Responded well to osteopathic manipulation.

## 2016-10-08 NOTE — Patient Instructions (Addendum)
Good to see you.  Ice 20 minutes 2 times daily. Usually after activity and before bed. Exercises 3 times a week.  On wall with heels, butt shoulder and head touching for a goal of 5 minutes daily  Once weekly vitamin D for 12 weeks.  You should do well  See me again in 4 weeks

## 2016-10-08 NOTE — Assessment & Plan Note (Addendum)
Decision today to treat with OMT was based on Physical Exam  After verbal consent patient was treated with  ME, FPR techniques in cervical, thoracic, lumbar and sacral areas  Patient tolerated the procedure well with improvement in symptoms  Patient given exercises, stretches and lifestyle modifications  See medications in patient instructions if given  Patient will follow up in 4 weeks 

## 2016-10-08 NOTE — Progress Notes (Signed)
[facility] [address] [place], [place] [address] Phone: [phone] Subjective:    CC: jaw pain   BTD:[id number]  Patricia Chandler is a 61 y.o. female coming in with complaint of Jaw pain. Patient has seen in tenderness and did not find any signs that this was dental. Unfortunate seems to come from her neck. States that it can radiate up to the right side of her job. Denies any headache within but states that it can be unrelenting. Some more pain at night from time to time. Patient states that she has been wearing a mouth guard with some mild improvement. Patient has had a history of chemotherapy recently secondary to breast cancer. Patient also had a mastectomy as well as breast augmentation. Patient states since all these different things of encouraging does not know exactly what the triggers arm. Rates the severity of pain a 6 out of 10. Not responding over-the-counter medications. No radiation down the arm. No numbness or weakness. No visual disturbances.    Past Medical History:  Diagnosis Date  . Anemia    due to chemo  . Anxiety   . Cardiomyopathy ([place])    mild, per cardiology note; due to Herceptin  . Dental crowns present   . Family history of adverse reaction to anesthesia    pt's daughter has hx. of post-op nausea  . GERD (gastroesophageal reflux disease)   . History of breast cancer 2017   right  . History of chemotherapy    finished chemo 04/15/2016  . Rash 08/04/2016   under right arm - states recent tick bite; to see MD 08/05/2016  . Runny nose 06/06/2016   clear drainage, per pt.   Past Surgical History:  Procedure Laterality Date  . BREAST ENHANCEMENT SURGERY Bilateral 2005  . BREAST RECONSTRUCTION WITH PLACEMENT OF TISSUE EXPANDER AND FLEX HD (ACELLULAR HYDRATED DERMIS) Right 05/19/2016   Procedure: RIGHT BREAST RECONSTRUCTION WITH PLACEMENT OF TISSUE EXPANDER AND  ALLODERM.;  Surgeon: Irene Limbo, MD;  Location: [facility];  Service: Plastics;  Laterality: Right;  . COLONOSCOPY    . COLONOSCOPY WITH PROPOFOL  10/02/2014  . DEBRIDEMENT AND CLOSURE WOUND Right 06/08/2016   Procedure: DEBRIDEMENT RIGHT MASTECTOMY FLAP, TISSUE EXPANSION OF RIGHT CHEST;  Surgeon: Irene Limbo, MD;  Location: [facility];  Service: Plastics;  Laterality: Right;  . MASTOPEXY Left 08/11/2016   Procedure: LEFT BREAST MASTOPEXY;  Surgeon: Irene Limbo, MD;  Location: [facility];  Service: Plastics;  Laterality: Left;  . NIPPLE SPARING MASTECTOMY/SENTINAL LYMPH NODE BIOPSY/RECONSTRUCTION/PLACEMENT OF TISSUE EXPANDER Right 05/19/2016   Procedure: RIGHT NIPPLE SPARING MASTECTOMY WITH SENTINAL LYMPH NODE BIOPSY WITH BLUE DYE INJECTION;  Surgeon: Rolm Bookbinder, MD;  Location: [facility];  Service: General;  Laterality: Right;  . PORTACATH PLACEMENT N/A 12/30/2015   Procedure: INSERTION PORT-A-CATH WITH [place];  Surgeon: Rolm Bookbinder, MD;  Location: [facility];  Service: General;  Laterality: N/A;  . REMOVAL OF TISSUE EXPANDER AND PLACEMENT OF IMPLANT Right 08/11/2016   Procedure: REMOVAL OF RIGHT  TISSUE EXPANDER AND PLACEMENT OF IMPLANT;  Surgeon: Irene Limbo, MD;  Location: [facility];  Service: Plastics;  Laterality: Right;   Social History   Social History  . Marital status: Married    Spouse name: N/A  . Number of children: 2  . Years of education: N/A   Occupational History  . medical receptionist    Social History Main Topics  .  Smoking status: Never Smoker  . Smokeless tobacco: Never Used  . Alcohol use Yes     Comment: occasionally  . Drug use: No  . Sexual activity: Not Asked   Other Topics Concern  . None   Social History Narrative   Married 1978 (Al). 2 girls 33 and 36 in 2018. Patricia Chandler (Patricia Chandler) lives in [place], [place] with 57 yo son (single right now). and Patricia Chandler in [place], [place]--> now [place] with husband and 39 year old daughter  (born with heart defect with missing valve and initially not closed VSD--still weighs heavily on family- likely will have to be repaired).       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    Graduated from [employer]. [employer] prior.       Hobbies: gardening, decorating, sewing, enjoys creating   No Known Allergies Family History  Problem Relation Age of Onset  . Breast cancer Maternal Aunt        dx in her 41s  . Stroke Father        late 13s  . Hypertension Father   . Pulmonary fibrosis Mother   . Breast cancer Paternal Aunt 78  . Cancer Paternal Grandmother        abdominal; cervical vs uterine dx in her 44s  . Parkinson's disease Maternal Grandfather   . Anesthesia problems Daughter        post-op nausea    Past medical history, social, surgical and family history all reviewed in electronic medical record.  No pertanent information unless stated regarding to the chief complaint.   Review of Systems:Review of systems updated and as accurate as of 10/08/16  No headache, visual changes, nausea, vomiting, diarrhea, constipation, dizziness, abdominal pain, skin rash, fevers, chills, night sweats, weight loss, swollen lymph nodes, body aches, joint swelling, muscle aches, chest pain, shortness of breath, mood changes.   Objective  Blood pressure 100/78, pulse 68, height 5\' 3"  (1.6 m), weight 137 lb (62.1 kg), SpO2 97 %. Systems examined below as of 10/08/16   General: No apparent distress alert and oriented x3 mood and affect normal, dressed appropriately.  HEENT: Pupils equal, extraocular movements intact  Respiratory: Patient's speak in full sentences and does not appear short of breath  Cardiovascular: No lower extremity edema, non tender, no erythema  Skin: Warm dry intact with no signs of infection or rash on extremities or on axial skeleton.  Abdomen: Soft nontender  Neuro: Cranial nerves II through XII are  intact, neurovascularly intact in all extremities with 2+ DTRs and 2+ pulses.  Lymph: No lymphadenopathy of posterior or anterior cervical chain or axillae bilaterally.  Gait normal with good balance and coordination.  MSK:  Non tender with full range of motion and good stability and symmetric strength and tone of shoulders, elbows, wrist, hip, knee and ankles bilaterally.  Neck: Inspection unremarkable. No palpable stepoffs. Negative Spurling's maneuver. Mild limitation in range of motion lacking the last 5 of extension as well as the right side side bending Grip strength and sensation normal in bilateral hands Strength good C4 to T1 distribution No sensory change to C4 to T1 Negative Hoffman sign bilaterally Reflexes normal Patient's job with opening closing does not see any lateral deviation  Osteopathic findings C2 flexed rotated and side bent right C4 flexed rotated and side bent left C6 flexed rotated and side bent left T3 extended rotated and side bent right inhaled  third rib T9 extended rotated and side bent left     Impression and Recommendations:     This case required medical decision making of moderate complexity.      Note: This dictation was prepared with Dragon dictation along with smaller phrase technology. Any transcriptional errors that result from this process are unintentional.

## 2016-10-19 ENCOUNTER — Encounter: Payer: Self-pay | Admitting: Family Medicine

## 2016-10-20 ENCOUNTER — Ambulatory Visit (HOSPITAL_BASED_OUTPATIENT_CLINIC_OR_DEPARTMENT_OTHER): Payer: BLUE CROSS/BLUE SHIELD | Admitting: Hematology and Oncology

## 2016-10-20 ENCOUNTER — Other Ambulatory Visit (HOSPITAL_BASED_OUTPATIENT_CLINIC_OR_DEPARTMENT_OTHER): Payer: BLUE CROSS/BLUE SHIELD

## 2016-10-20 ENCOUNTER — Ambulatory Visit (HOSPITAL_BASED_OUTPATIENT_CLINIC_OR_DEPARTMENT_OTHER): Payer: BLUE CROSS/BLUE SHIELD

## 2016-10-20 ENCOUNTER — Encounter: Payer: Self-pay | Admitting: Hematology and Oncology

## 2016-10-20 ENCOUNTER — Ambulatory Visit: Payer: BLUE CROSS/BLUE SHIELD

## 2016-10-20 DIAGNOSIS — Z170 Estrogen receptor positive status [ER+]: Secondary | ICD-10-CM

## 2016-10-20 DIAGNOSIS — C50211 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 female breast: Secondary | ICD-10-CM

## 2016-10-20 DIAGNOSIS — Z5112 Encounter for antineoplastic immunotherapy: Secondary | ICD-10-CM | POA: Diagnosis not present

## 2016-10-20 DIAGNOSIS — Z95828 Presence of other vascular implants and grafts: Secondary | ICD-10-CM

## 2016-10-20 LAB — CBC WITH DIFFERENTIAL/PLATELET
BASO%: 0.7 % (ref 0.0–2.0)
BASOS ABS: 0 10*3/uL (ref 0.0–0.1)
EOS ABS: 0.1 10*3/uL (ref 0.0–0.5)
EOS%: 1.8 % (ref 0.0–7.0)
HEMATOCRIT: 32.9 % — AB (ref 34.8–46.6)
HEMOGLOBIN: 11.3 g/dL — AB (ref 11.6–15.9)
LYMPH#: 2 10*3/uL (ref 0.9–3.3)
LYMPH%: 36.1 % (ref 14.0–49.7)
MCH: 30.6 pg (ref 25.1–34.0)
MCHC: 34.3 g/dL (ref 31.5–36.0)
MCV: 89.2 fL (ref 79.5–101.0)
MONO#: 0.4 10*3/uL (ref 0.1–0.9)
MONO%: 6.7 % (ref 0.0–14.0)
NEUT#: 3 10*3/uL (ref 1.5–6.5)
NEUT%: 54.7 % (ref 38.4–76.8)
PLATELETS: 186 10*3/uL (ref 145–400)
RBC: 3.69 10*6/uL — ABNORMAL LOW (ref 3.70–5.45)
RDW: 14.2 % (ref 11.2–14.5)
WBC: 5.5 10*3/uL (ref 3.9–10.3)

## 2016-10-20 LAB — COMPREHENSIVE METABOLIC PANEL
ALBUMIN: 3.6 g/dL (ref 3.5–5.0)
ALK PHOS: 54 U/L (ref 40–150)
ALT: 16 U/L (ref 0–55)
ANION GAP: 9 meq/L (ref 3–11)
AST: 16 U/L (ref 5–34)
BUN: 22.7 mg/dL (ref 7.0–26.0)
CALCIUM: 9.3 mg/dL (ref 8.4–10.4)
CO2: 24 mEq/L (ref 22–29)
CREATININE: 0.8 mg/dL (ref 0.6–1.1)
Chloride: 107 mEq/L (ref 98–109)
EGFR: 77 mL/min/{1.73_m2} — ABNORMAL LOW (ref >90)
Glucose: 111 mg/dl (ref 70–140)
POTASSIUM: 3.9 meq/L (ref 3.5–5.1)
Sodium: 140 mEq/L (ref 136–145)
Total Bilirubin: 0.39 mg/dL (ref 0.20–1.20)
Total Protein: 5.9 g/dL — ABNORMAL LOW (ref 6.4–8.3)

## 2016-10-20 MED ORDER — ACETAMINOPHEN 325 MG PO TABS
650.0000 mg | ORAL_TABLET | Freq: Once | ORAL | Status: AC
Start: 2016-10-20 — End: 2016-10-20
  Administered 2016-10-20: 650 mg via ORAL

## 2016-10-20 MED ORDER — DIPHENHYDRAMINE HCL 25 MG PO CAPS
25.0000 mg | ORAL_CAPSULE | Freq: Once | ORAL | Status: AC
  Administered 2016-10-20: 25 mg via ORAL

## 2016-10-20 MED ORDER — SODIUM CHLORIDE 0.9 % IJ SOLN
10.0000 mL | INTRAMUSCULAR | Status: DC | PRN
  Administered 2016-10-20: 10 mL via INTRAVENOUS
  Filled 2016-10-20: qty 10

## 2016-10-20 MED ORDER — SODIUM CHLORIDE 0.9% FLUSH
10.0000 mL | INTRAVENOUS | Status: DC | PRN
  Administered 2016-10-20: 10 mL
  Filled 2016-10-20: qty 10

## 2016-10-20 MED ORDER — SODIUM CHLORIDE 0.9 % IV SOLN
Freq: Once | INTRAVENOUS | Status: AC
  Administered 2016-10-20: 09:00:00 via INTRAVENOUS

## 2016-10-20 MED ORDER — PERTUZUMAB CHEMO INJECTION 420 MG/14ML
420.0000 mg | Freq: Once | INTRAVENOUS | Status: AC
  Administered 2016-10-20: 420 mg via INTRAVENOUS
  Filled 2016-10-20: qty 14

## 2016-10-20 MED ORDER — DIPHENHYDRAMINE HCL 25 MG PO CAPS
ORAL_CAPSULE | ORAL | Status: AC
  Filled 2016-10-20: qty 1

## 2016-10-20 MED ORDER — HEPARIN SOD (PORK) LOCK FLUSH 100 UNIT/ML IV SOLN
500.0000 [IU] | Freq: Once | INTRAVENOUS | Status: AC | PRN
  Administered 2016-10-20: 500 [IU]
  Filled 2016-10-20: qty 5

## 2016-10-20 MED ORDER — TRASTUZUMAB CHEMO 150 MG IV SOLR
6.0000 mg/kg | Freq: Once | INTRAVENOUS | Status: AC
  Administered 2016-10-20: 378 mg via INTRAVENOUS
  Filled 2016-10-20: qty 18

## 2016-10-20 MED ORDER — ACETAMINOPHEN 325 MG PO TABS
ORAL_TABLET | ORAL | Status: AC
  Filled 2016-10-20: qty 2

## 2016-10-20 NOTE — Assessment & Plan Note (Signed)
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 Genetic counseling: Normal did not reveal any abnormal mutations 2. Neoadjuvant chemotherapy. ([facility] Perjeta 6 cycles followed by Herceptin And Perjetamaintenance for one year) from 12/31/15 to 04/15/16 3. Right mastectomy with reconstruction: 05/19/2016:Right mastectomy: IDC grade 3, 1.3 cm, low-grade DCIS, LVIDS present, margins negative, 0/1 lymph node negative, ypT1CypN0 stage IA; repeat HER-2 negative ratio 1.41 4. Adjuvant antiestrogen therapy With letrozole 2.5 mg daily to be started March 2018 -------------------------------------------------------------------------------------------------------------------------------------------------- Current treatment: 1. Resumed maintenance Herceptin and Perjeta because of improvement in cardiac ejection fraction 2. antiestrogen therapy with letrozole 2.5 mg daily started March 2018 3. Consideration for Neratinib after conclusion of Herceptin and Perjeta   Cardiologist monitoring closely with echocardiograms for ejection fraction.  Patient is very anxious to get a scan after the conclusion of Herceptin and Perjeta.  Discussed the results of PERSEPHONE clinical trial 6 months versus 12 months of trastuzumab. This she would receive 6 months was non-inferior to 12 months. The study was not conducted with Herceptin and Perjeta. We went back and forth and determined that she does have at least 2-3% additional PFS benefit but continuing it for 1 year. 

## 2016-10-20 NOTE — Progress Notes (Signed)
Patient Care Team: [name], MD as PCP - General (Family Medicine)  DIAGNOSIS:  Encounter Diagnosis  Name Primary?  . 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 breast in female, estrogen receptor positive ([place])     SUMMARY OF ONCOLOGIC HISTORY:   Breast cancer of upper-inner quadrant of right female breast ([place])   12/05/2015 Mammogram    Right breast mass 2:00 4 cm from nipple: 1.7 cm, T1c N0 stage IA; right breast 2:00 6 cm from nipple 1.3 cm mass, 7 mm satellite nodule between the 2 masses, T1 cN0 stage IA      12/06/2015 Initial Diagnosis    Right breast biopsy 2:00 6 cm from nipple: Grade 2-3 invasive ductal cancer, ER 90%, PR 2%, Ki-67 80%, HER-2 positive ratio 2.07; right biopsy 2:00 4 cm from nipple: IDC with DCIS, ER 95%, PR 10%, Ki-67 90%, HER-2 negative ratio 1.27      12/18/2015 Breast MRI    3 enhancing masses in the upper inner quadrant right breast spanning an area 3.7 cm (1.7 cm, 1 cm, 1.4 cm) no lymph node enlargement      12/31/2015 - 04/15/2016 Neo-Adjuvant Chemotherapy    [facility] Perjeta 6 cycles followed by Herceptin, Perjeta maintenance for 1 year      01/08/2016 - 01/12/2016 Hospital Admission    Neutropenic fever hospitalization (because patient did not receive Neulasta with cycle 1)      01/10/2016 Miscellaneous    Genetic testing was normal did not reveal any mutations      04/16/2016 Breast MRI    Near CR to therapy. Previous 2 biopsied massses are no longer seen. Third satellite lesion is smaller from 10 mm to 5.3 mm, no abnormal LN.      05/19/2016 Surgery    Right mastectomy: IDC grade 3, 1.3 cm, low-grade DCIS, LVIDS present, margins negative, 0/1 lymph node negative, ypT1CypN0 stage IA; repeat HER-2 negative ratio 1.41      05/19/2016 Surgery    Right breast reconstruction with tissue expander. Acellular dermis for breast reconstruction (Dr.Thimappa)        CHIEF COMPLIANT: Follow-up on Herceptin and Perjeta maintenance,    on letrozole  INTERVAL HISTORY: Patricia [facility] is a 61 year old with above-mentioned history of right breast cancer HER-2 positive and ER positive disease was currently on Herceptin and Perjeta maintenance. She does have diarrhea related to the treatment. Otherwise she is tolerating it well. She is tolerating letrozole extremely well without any hot flashes or myalgias.  REVIEW OF SYSTEMS:   Constitutional: Denies fevers, chills or abnormal weight loss Eyes: Denies blurriness of vision Ears, nose, mouth, throat, and face: Denies mucositis or sore throat Respiratory: Denies cough, dyspnea or wheezes Cardiovascular: Denies palpitation, chest discomfort Gastrointestinal:  Intermittent diarrhea from Perjeta Skin: Denies abnormal skin rashes Lymphatics: Denies new lymphadenopathy or easy bruising Neurological:Denies numbness, tingling or new weaknesses Behavioral/Psych: Mood is stable, no new changes  Extremities: No lower extremity edema Breast:  denies any pain or lumps or nodules in either breasts All other systems were reviewed with the patient and are negative.  I have reviewed the past medical history, past surgical history, social history and family history with the patient and they are unchanged from previous note.  ALLERGIES:  has No Known Allergies.  MEDICATIONS:  Current Outpatient Prescriptions  Medication Sig Dispense Refill  . ALPRAZolam (XANAX) 0.5 MG tablet Take 0.5 mg by mouth at bedtime as needed for anxiety. Takes 1/3 of a tablet    .  letrozole (FEMARA) 2.5 MG tablet Take 2.5 mg by mouth daily.    Marland Kitchen losartan (COZAAR) 25 MG tablet Take 0.5 tablets (12.5 mg total) by mouth 2 (two) times daily. 90 tablet 3  . pantoprazole (PROTONIX) 40 MG tablet Take 40 mg by mouth daily.   0  . Vitamin D, Ergocalciferol, (DRISDOL) 50000 units CAPS capsule Take 1 capsule (50,000 Units total) by mouth every 7 (seven) days. 12 capsule 0  . zolpidem (AMBIEN) 5 MG tablet TAKE 1 TABLET BY  MOUTH ONCE DAILY AT BEDTIME AS NEEDED FOR SLEEP 30 tablet 0   No current facility-administered medications for this visit.     PHYSICAL EXAMINATION: ECOG PERFORMANCE STATUS: 1 - Symptomatic but completely ambulatory  Vitals:   10/20/16 0817  BP: 108/78  Pulse: 71  Resp: 18  Temp: 97.7 F (36.5 C)   Filed Weights   10/20/16 0817  Weight: 132 lb 11.2 oz (60.2 kg)    GENERAL:alert, no distress and comfortable SKIN: skin color, texture, turgor are normal, no rashes or significant lesions EYES: normal, Conjunctiva are pink and non-injected, sclera clear OROPHARYNX:no exudate, no erythema and lips, buccal mucosa, and tongue normal  NECK: supple, thyroid normal size, non-tender, without nodularity LYMPH:  no palpable lymphadenopathy in the cervical, axillary or inguinal LUNGS: clear to auscultation and percussion with normal breathing effort HEART: regular rate & rhythm and no murmurs and no lower extremity edema ABDOMEN:abdomen soft, non-tender and normal bowel sounds MUSCULOSKELETAL:no cyanosis of digits and no clubbing  NEURO: alert & oriented x 3 with fluent speech, no focal motor/sensory deficits EXTREMITIES: No lower extremity edema   LABORATORY DATA:  I have reviewed the data as listed   Chemistry      Component Value Date/Time   NA 140 10/20/2016 0757   K 3.9 10/20/2016 0757   CL 111 01/12/2016 0520   CO2 24 10/20/2016 0757   BUN 22.7 10/20/2016 0757   CREATININE 0.8 10/20/2016 0757      Component Value Date/Time   CALCIUM 9.3 10/20/2016 0757   ALKPHOS 54 10/20/2016 0757   AST 16 10/20/2016 0757   ALT 16 10/20/2016 0757   BILITOT 0.39 10/20/2016 0757       Lab Results  Component Value Date   WBC 5.5 10/20/2016   HGB 11.3 (L) 10/20/2016   HCT 32.9 (L) 10/20/2016   MCV 89.2 10/20/2016   PLT 186 10/20/2016   NEUTROABS 3.0 10/20/2016    ASSESSMENT & PLAN:  Breast cancer of upper-inner quadrant of right female breast ([facility]) Right breast biopsy 2:00 6 cm  from nipple: Grade 2-3 invasive ductal cancer, ER 90%, PR 2%, Ki-67 80%, HER-2 positive ratio 2.07; right biopsy 2:00 4 cm from nipple: IDC with DCIS, ER 95%, PR 10%, Ki-67 90%, HER-2 negative ratio 1.27 Breast MRI08/30/2017: 3 enhancing masses in the upper inner quadrant right breast spanning an area 3.7 cm (1.7 cm, 1 cm, 1.4 cm) no lymph node enlargement Clinical stage: T2N0 (stage 2A)  Treatment plan: 1. Genetic counseling: Normal did not reveal any abnormal mutations 2. Neoadjuvant chemotherapy. ([facility] Perjeta 6 cycles followed by Herceptin And Perjetamaintenance for one year) from 12/31/15 to 04/15/16 3. Right mastectomy with reconstruction: 05/19/2016:Right mastectomy: IDC grade 3, 1.3 cm, low-grade DCIS, LVIDS present, margins negative, 0/1 lymph node negative, ypT1CypN0 stage IA; repeat HER-2 negative ratio 1.41 4. Adjuvant antiestrogen therapy With letrozole 2.5 mg daily to be started March 2018 -------------------------------------------------------------------------------------------------------------------------------------------------- Current treatment: 1. Resumed maintenance Herceptin and Perjeta because of improvement in  cardiac ejection fraction 2. antiestrogen therapy with letrozole 2.5 mg daily started March 2018 3. Consideration for Neratinib after conclusion of Herceptin and Perjeta   Cardiologist monitoring closely with echocardiograms for ejection fraction.  Patient is very anxious to get a scan after the conclusion of Herceptin and Perjeta.  Discussed the results of PERSEPHONE clinical trial 6 months versus 12 months of trastuzumab. This she would receive 6 months was non-inferior to 12 months. The study was not conducted with Herceptin and Perjeta. We went back and forth and determined that she does have at least 2-3% additional PFS benefit but continuing it for 1 year. I also discussed the role of Neratinib. I provided her with literature to take home and read. If  patient decides to stop Herceptin and Perjeta, I would consider it is reasonable approach.  Return to clinic in 6 weeks for follow-up. I spent 25 minutes talking to the patient of which more than half was spent in counseling and coordination of care.  No orders of the defined types were placed in this encounter.  The patient has a good understanding of the overall plan. she agrees with it. she will call with any problems that may develop before the next visit here.   Rulon Eisenmenger, MD 10/20/16

## 2016-10-20 NOTE — Patient Instructions (Signed)
[facility] Discharge Instructions for Patients Receiving Chemotherapy  Today you received the following chemotherapy agents herceptin/perjeta  To help prevent nausea and vomiting after your treatment, we encourage you to take your nausea medication as directed   If you develop nausea and vomiting that is not controlled by your nausea medication, call the clinic.   BELOW ARE SYMPTOMS THAT SHOULD BE REPORTED IMMEDIATELY:  *FEVER GREATER THAN 100.5 F  *CHILLS WITH OR WITHOUT FEVER  NAUSEA AND VOMITING THAT IS NOT CONTROLLED WITH YOUR NAUSEA MEDICATION  *UNUSUAL SHORTNESS OF BREATH  *UNUSUAL BRUISING OR BLEEDING  TENDERNESS IN MOUTH AND THROAT WITH OR WITHOUT PRESENCE OF ULCERS  *URINARY PROBLEMS  *BOWEL PROBLEMS  UNUSUAL RASH Items with * indicate a potential emergency and should be followed up as soon as possible.  Feel free to call the clinic you have any questions or concerns. The clinic phone number is [phone].  

## 2016-10-21 ENCOUNTER — Encounter: Payer: Self-pay | Admitting: Hematology and Oncology

## 2016-10-23 ENCOUNTER — Telehealth: Payer: Self-pay

## 2016-10-23 NOTE — Telephone Encounter (Signed)
Called pt and lvm to let her know that we have cancelled her future infusions per request. Dr.Gudena is aware of pt decision not to continue with her infusions. Pt to follow up with Dr.Gudena in 24months. Scheduled pt for December 2018. Also will need to discuss port removal with pt per MD. Call back number provided.

## 2016-10-26 ENCOUNTER — Other Ambulatory Visit: Payer: Self-pay | Admitting: *Deleted

## 2016-10-26 DIAGNOSIS — Z170 Estrogen receptor positive status [ER+]: Secondary | ICD-10-CM

## 2016-10-26 DIAGNOSIS — C50211 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 female breast: Secondary | ICD-10-CM

## 2016-10-29 ENCOUNTER — Encounter (HOSPITAL_COMMUNITY): Payer: Self-pay | Admitting: Internal Medicine

## 2016-10-29 ENCOUNTER — Ambulatory Visit (HOSPITAL_COMMUNITY)
Admission: RE | Admit: 2016-10-29 | Discharge: 2016-10-29 | Disposition: A | Discharge status: Home / Self Care | Payer: BLUE CROSS/BLUE SHIELD | Source: Ambulatory Visit | Attending: Family Medicine | Admitting: Family Medicine

## 2016-10-29 ENCOUNTER — Ambulatory Visit (HOSPITAL_BASED_OUTPATIENT_CLINIC_OR_DEPARTMENT_OTHER)
Admission: RE | Admit: 2016-10-29 | Discharge: 2016-10-29 | Disposition: A | Discharge status: Home / Self Care | Payer: BLUE CROSS/BLUE SHIELD | Source: Ambulatory Visit | Attending: Internal Medicine | Admitting: Internal Medicine

## 2016-10-29 VITALS — BP 110/72 | HR 63 | Wt 131.8 lb

## 2016-10-29 DIAGNOSIS — C50211 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 female breast: Secondary | ICD-10-CM | POA: Diagnosis not present

## 2016-10-29 DIAGNOSIS — F419 Anxiety disorder, unspecified: Secondary | ICD-10-CM | POA: Insufficient documentation

## 2016-10-29 DIAGNOSIS — K219 Gastro-esophageal reflux disease without esophagitis: Secondary | ICD-10-CM | POA: Insufficient documentation

## 2016-10-29 DIAGNOSIS — D6959 Other secondary thrombocytopenia: Secondary | ICD-10-CM | POA: Diagnosis not present

## 2016-10-29 DIAGNOSIS — Z09 Encounter for follow-up examination after completed treatment for conditions other than malignant neoplasm: Secondary | ICD-10-CM | POA: Diagnosis present

## 2016-10-29 DIAGNOSIS — T451X5A Adverse effect of antineoplastic and immunosuppressive drugs, initial encounter: Secondary | ICD-10-CM | POA: Diagnosis not present

## 2016-10-29 DIAGNOSIS — Z79899 Other long term (current) drug therapy: Secondary | ICD-10-CM | POA: Insufficient documentation

## 2016-10-29 DIAGNOSIS — I429 Cardiomyopathy, unspecified: Secondary | ICD-10-CM | POA: Insufficient documentation

## 2016-10-29 NOTE — Progress Notes (Signed)
CARDIO-ONCOLOGY CLINIC NOTE  Patient Care Team: Patricia Olp, MD as PCP - General (Family Medicine)  Referring oncologist: Patricia Chandler   HISTORY OF PRESENTING ILLNESS:  Patricia Chandler is a 61 y/o woman with no cardiac history undergoing treatment for right breast cancer.   SUMMARY OF ONCOLOGIC HISTORY:   Breast cancer of upper-inner quadrant of right female breast ([facility])   12/05/2015 Mammogram    Right breast mass 2:00 4 cm from nipple: 1.7 cm, T1c N0 stage IA; right breast 2:00 6 cm from nipple 1.3 cm mass, 7 mm satellite nodule between the 2 masses, T1 cN0 stage IA      12/06/2015 Initial Diagnosis    Right breast biopsy 2:00 6 cm from nipple: Grade 2-3 invasive ductal cancer, ER 90%, PR 2%, Ki-67 80%, HER-2 positive ratio 2.07; right biopsy 2:00 4 cm from nipple: IDC with DCIS, ER 95%, PR 10%, Ki-67 90%, HER-2 negative ratio 1.27      12/18/2015 Breast MRI    3 enhancing masses in the upper inner quadrant right breast spanning an area 3.7 cm (1.7 cm, 1 cm, 1.4 cm) no lymph node enlargement      12/31/2015 - 04/15/2016 Neo-Adjuvant Chemotherapy    TCH Perjeta 6 cycles followed by Herceptin, Perjeta maintenance for 1 year      01/08/2016 - 01/12/2016 Hospital Admission    Neutropenic fever hospitalization (because patient did not receive Neulasta with cycle 1)      01/10/2016 Miscellaneous    Genetic testing was normal did not reveal any mutations      04/16/2016 Breast MRI    Near CR to therapy. Previous 2 biopsied massses are no longer seen. Third satellite lesion is smaller from 10 mm to 5.3 mm, no abnormal LN.      05/19/2016 Surgery    Right mastectomy: IDC grade 3, 1.3 cm, low-grade DCIS, LVIDS present, margins negative, 0/1 lymph node negative, [id number] stage IA; repeat HER-2 negative ratio 1.41      05/19/2016 Surgery    Right breast reconstruction with tissue expander. Acellular dermis for breast reconstruction (Dr.Thimappa)       Subjective   Finished  chemo on 04/15/16. Has been getting herceptin and perjeta. S/p mastectomy with reconstruction in 1/18.   She returns for cardio-onc follow up. In 3/18  herceptin was held due to reduced EF. Has been on losartan. Echo in 5/18 with normalization of LV function. Herceptin restarted. Has tolerated well. Has fatigue but denies edema, orthopnea or PND.   Echo (12/26/15) EF 60-65% GLS -22.5% Lateral s' 11.4 cm/s.  Echo 04/02/16 EF 60-65% GLS - 21.3% Lateral s' 10.8 cm/s  Echo 07/01/16 EF 50-55% LS 9.4 cm/s GLS -19.2% ECHO 09/10/2016: EF 60-65% LS 10.4 GLS -21. ECHO 09/10/2016: EF 55-60% LS 10.6 GLS -20%. (reviewed personally)  MEDICAL HISTORY:  Past Medical History:  Diagnosis Date  . Anemia    due to chemo  . Anxiety   . Cardiomyopathy ([place])    mild, per cardiology note; due to Herceptin  . Dental crowns present   . Family history of adverse reaction to anesthesia    pt's daughter has hx. of post-op nausea  . GERD (gastroesophageal reflux disease)   . History of breast cancer 2017   right  . History of chemotherapy    finished chemo 04/15/2016  . Rash 08/04/2016   under right arm - states recent tick bite; to see MD 08/05/2016  . Runny nose 06/06/2016   clear drainage, per  pt.    SURGICAL HISTORY: Past Surgical History:  Procedure Laterality Date  . BREAST ENHANCEMENT SURGERY Bilateral 2005  . BREAST RECONSTRUCTION WITH PLACEMENT OF TISSUE EXPANDER AND FLEX HD (ACELLULAR HYDRATED DERMIS) Right 05/19/2016   Procedure: RIGHT BREAST RECONSTRUCTION WITH PLACEMENT OF TISSUE EXPANDER AND  ALLODERM.;  Surgeon: Patricia Limbo, MD;  Location: [facility];  Service: Plastics;  Laterality: Right;  . COLONOSCOPY    . COLONOSCOPY WITH PROPOFOL  10/02/2014  . DEBRIDEMENT AND CLOSURE WOUND Right 06/08/2016   Procedure: DEBRIDEMENT RIGHT MASTECTOMY FLAP, TISSUE EXPANSION OF RIGHT CHEST;  Surgeon: Patricia Limbo, MD;  Location: [facility];  Service: Plastics;  Laterality: Right;  . MASTOPEXY  Left 08/11/2016   Procedure: LEFT BREAST MASTOPEXY;  Surgeon: Patricia Limbo, MD;  Location: [facility];  Service: Plastics;  Laterality: Left;  . NIPPLE SPARING MASTECTOMY/SENTINAL LYMPH NODE BIOPSY/RECONSTRUCTION/PLACEMENT OF TISSUE EXPANDER Right 05/19/2016   Procedure: RIGHT NIPPLE SPARING MASTECTOMY WITH SENTINAL LYMPH NODE BIOPSY WITH BLUE DYE INJECTION;  Surgeon: Patricia Bookbinder, MD;  Location: [facility];  Service: General;  Laterality: Right;  . PORTACATH PLACEMENT N/A 12/30/2015   Procedure: INSERTION PORT-A-CATH WITH [place];  Surgeon: Patricia Bookbinder, MD;  Location: [facility];  Service: General;  Laterality: N/A;  . REMOVAL OF TISSUE EXPANDER AND PLACEMENT OF IMPLANT Right 08/11/2016   Procedure: REMOVAL OF RIGHT  TISSUE EXPANDER AND PLACEMENT OF IMPLANT;  Surgeon: Patricia Limbo, MD;  Location: [facility];  Service: Plastics;  Laterality: Right;    SOCIAL HISTORY: Social History   Social History  . Marital status: Married    Spouse name: N/A  . Number of children: 2  . Years of education: N/A   Occupational History  . medical receptionist    Social History Main Topics  . Smoking status: Never Smoker  . Smokeless tobacco: Never Used  . Alcohol use Yes     Comment: occasionally  . Drug use: No  . Sexual activity: Not on file   Other Topics Concern  . Not on file   Social History Narrative   Married 1978 (Patricia Chandler). 2 girls 33 and 36 in 2018. Patricia Mola (liz) lives in [place], [place] with 26 yo son (single right now). and Patricia Chandler in [place], [place]--> now [place] with husband and 29 year old daughter (born with heart defect with missing valve and initially not closed VSD--still weighs heavily on family- likely will have to be repaired).       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 Had been doing event design on hold.    Graduated from [place]. [employer] prior.       Hobbies:  gardening, decorating, sewing, enjoys creating    FAMILY HISTORY: Family History  Problem Relation Age of Onset  . Breast cancer Maternal Aunt        dx in her 39s  . Stroke Father        late 44s  . Hypertension Father   . Pulmonary fibrosis Mother   . Breast cancer Paternal Aunt 78  . Cancer Paternal Grandmother        abdominal; cervical vs uterine dx in her 101s  . Parkinson's disease Maternal Grandfather   . Anesthesia problems Daughter        post-op nausea    ALLERGIES:  has No Known Allergies.  MEDICATIONS:  Current Outpatient Prescriptions  Medication Sig Dispense Refill  . ALPRAZolam (XANAX) 0.5 MG tablet Take  0.5 mg by mouth at bedtime as needed for anxiety. Takes 1/3 of a tablet    . letrozole (FEMARA) 2.5 MG tablet Take 2.5 mg by mouth daily.    Marland Kitchen losartan (COZAAR) 25 MG tablet Take 0.5 tablets (12.5 mg total) by mouth 2 (two) times daily. 90 tablet 3  . Vitamin D, Ergocalciferol, (DRISDOL) 50000 units CAPS capsule Take 1 capsule (50,000 Units total) by mouth every 7 (seven) days. 12 capsule 0  . zolpidem (AMBIEN) 5 MG tablet TAKE 1 TABLET BY MOUTH ONCE DAILY AT BEDTIME AS NEEDED FOR SLEEP 30 tablet 0   No current facility-administered medications for this encounter.     Vitals:   10/29/16 1041  BP: 110/72  Pulse: 63   Filed Weights   10/29/16 1041  Weight: 131 lb 12.8 oz (59.8 kg)   General:  Well appearing. No resp difficulty HEENT: normal Neck: supple. no JVD. Carotids 2+ bilat; no bruits. No lymphadenopathy or thryomegaly appreciated. Cor: PMI nondisplaced. Regular rate & rhythm. No rubs, gallops or murmurs. R port-a-cath Lungs: clear Abdomen: soft, nontender, nondistended. No hepatosplenomegaly. No bruits or masses. Good bowel sounds. Extremities: no cyanosis, clubbing, rash, edema Neuro: alert & orientedx3, cranial nerves grossly intact. moves all 4 extremities w/o difficulty. Affect pleasant  nerves grossly intact. moves all 4 extremities w/o  difficulty. Affect pleasant   LABORATORY DATA:  I have reviewed the data as listed Lab Results  Component Value Date   WBC 5.5 10/20/2016   HGB 11.3 (L) 10/20/2016   HCT 32.9 (L) 10/20/2016   MCV 89.2 10/20/2016   PLT 186 10/20/2016   Lab Results  Component Value Date   NA 140 10/20/2016   K 3.9 10/20/2016   CL 111 01/12/2016   CO2 24 10/20/2016     ASSESSMENT AND PLAN:   1. Breast cancer of upper-inner quadrant of right female breast ([facility]) -- ER 90%, PR 2%, Ki-67 80%, HER-2 positive ratio 2.07;  -- Clinical stage: T2N0 (stage 2A)  2. Chemotherapy related cardiomyopathy - EF recovered in 5/18. Has been back on herceptin since - I reviewed echos personally. EF and Doppler parameters stable. No HF on exam. Based on [place] trial she will stop Herceptin now after 6 months.  Can stop losartan. Can f/u PRN.    Glori Bickers, MD  10:51 AM

## 2016-10-29 NOTE — Addendum Note (Signed)
Encounter addended by: Scarlette Calico, RN on: 10/29/2016 11:36 AM<BR>    Actions taken: Order list changed, Medication long-term status modified, Sign clinical note

## 2016-10-29 NOTE — Patient Instructions (Signed)
Stop Losartan  Follow up as needed

## 2016-10-29 NOTE — Progress Notes (Signed)
  Echocardiogram 2D Echocardiogram has been performed.  Patricia Chandler 10/29/2016, 10:44 AM

## 2016-10-30 NOTE — Anesthesia Postprocedure Evaluation (Signed)
Anesthesia Post Note  Patient: Patricia Chandler  Procedure(s) Performed: Procedure(s) (LRB): REMOVAL OF RIGHT  TISSUE EXPANDER AND PLACEMENT OF IMPLANT (Right) LEFT BREAST MASTOPEXY (Left)     Anesthesia Post Evaluation  Last Vitals:  Vitals:   08/11/16 1048 08/11/16 1122  BP:  127/81  Pulse: (!) 104 99  Resp: 20 18  Temp:  36.4 C    Last Pain:  Vitals:   08/11/16 1122  TempSrc:   PainSc: 2                  Ebert Forrester EDWARD

## 2016-10-30 NOTE — Addendum Note (Signed)
Addendum  created 10/30/16 1137 by Lyndle Herrlich, MD   Sign clinical note

## 2016-11-08 NOTE — Progress Notes (Signed)
[facility] [address], [place] [address] Phone: [phone] Subjective:    CC: jaw pain f/u   [id number]  Patricia Chandler is a 61 y.o. female coming in with complaint of Jaw pain. We saw patient previously. Was diagnosed with more posture, as well as stress that seems to be related to it. We discussed with patient also some of her chemotherapy medications. Causing some tightness of her job. Given different home exercises and we discussed over-the-counter medications. Patient states Minimal improvement at this time. Patient continues to have significant tightness. Patient states none and does seem to be worse after chemotherapy medication.    Past Medical History:  Diagnosis Date  . Anemia    due to chemo  . Anxiety   . Cardiomyopathy ([facility])    mild, per cardiology note; due to Herceptin  . Dental crowns present   . Family history of adverse reaction to anesthesia    pt's daughter has hx. of post-op nausea  . GERD (gastroesophageal reflux disease)   . History of breast cancer 2017   right  . History of chemotherapy    finished chemo 04/15/2016  . Rash 08/04/2016   under right arm - states recent tick bite; to see MD 08/05/2016  . Runny nose 06/06/2016   clear drainage, per pt.   Past Surgical History:  Procedure Laterality Date  . BREAST ENHANCEMENT SURGERY Bilateral 2005  . BREAST RECONSTRUCTION WITH PLACEMENT OF TISSUE EXPANDER AND FLEX HD (ACELLULAR HYDRATED DERMIS) Right 05/19/2016   Procedure: RIGHT BREAST RECONSTRUCTION WITH PLACEMENT OF TISSUE EXPANDER AND  ALLODERM.;  Surgeon: Irene Limbo, MD;  Location: [facility];  Service: Plastics;  Laterality: Right;  . COLONOSCOPY    . COLONOSCOPY WITH PROPOFOL  10/02/2014  . DEBRIDEMENT AND CLOSURE WOUND Right 06/08/2016   Procedure: DEBRIDEMENT RIGHT MASTECTOMY FLAP, TISSUE EXPANSION OF RIGHT CHEST;  Surgeon: Irene Limbo, MD;  Location: [facility];  Service:  Plastics;  Laterality: Right;  . MASTOPEXY Left 08/11/2016   Procedure: LEFT BREAST MASTOPEXY;  Surgeon: Irene Limbo, MD;  Location: [facility];  Service: Plastics;  Laterality: Left;  . NIPPLE SPARING MASTECTOMY/SENTINAL LYMPH NODE BIOPSY/RECONSTRUCTION/PLACEMENT OF TISSUE EXPANDER Right 05/19/2016   Procedure: RIGHT NIPPLE SPARING MASTECTOMY WITH SENTINAL LYMPH NODE BIOPSY WITH BLUE DYE INJECTION;  Surgeon: Rolm Bookbinder, MD;  Location: [facility];  Service: General;  Laterality: Right;  . PORTACATH PLACEMENT N/A 12/30/2015   Procedure: INSERTION PORT-A-CATH WITH [place];  Surgeon: Rolm Bookbinder, MD;  Location: [facility];  Service: General;  Laterality: N/A;  . REMOVAL OF TISSUE EXPANDER AND PLACEMENT OF IMPLANT Right 08/11/2016   Procedure: REMOVAL OF RIGHT  TISSUE EXPANDER AND PLACEMENT OF IMPLANT;  Surgeon: Irene Limbo, MD;  Location: [facility];  Service: Plastics;  Laterality: Right;   Social History   Social History  . Marital status: Married    Spouse name: N/A  . Number of children: 2  . Years of education: N/A   Occupational History  . medical receptionist    Social History Main Topics  . Smoking status: Never Smoker  . Smokeless tobacco: Never Used  . Alcohol use Yes     Comment: occasionally  . Drug use: No  . Sexual activity: Not Asked   Other Topics Concern  . None   Social History Narrative   Married 1978 (Patricia Chandler). 2 girls 33 and 36 in 2018. Patricia Chandler Industrial/product designer) lives in [place],  [place] with 23 yo son (single right now). and Patricia Chandler in [place], [place]--> now [place] with husband and 70 year old daughter (born with heart defect with missing valve and initially not closed VSD--still weighs heavily on family- likely will have to be repaired).       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 Had been doing event design on hold.    Graduated from [employer]. [employer] prior.        Hobbies: gardening, decorating, sewing, enjoys creating   No Known Allergies Family History  Problem Relation Age of Onset  . Breast cancer Maternal Aunt        dx in her 33s  . Stroke Father        late 6s  . Hypertension Father   . Pulmonary fibrosis Mother   . Breast cancer Paternal Aunt 78  . Cancer Paternal Grandmother        abdominal; cervical vs uterine dx in her 2s  . Parkinson's disease Maternal Grandfather   . Anesthesia problems Daughter        post-op nausea    Past medical history, social, surgical and family history all reviewed in electronic medical record.  No pertanent information unless stated regarding to the chief complaint.   Review of Systems: No headache, visual changes, nausea, vomiting, diarrhea, constipation, dizziness, abdominal pain, skin rash, fevers, chills, night sweats, weight loss, swollen lymph nodes, body aches, joint swelling,  chest pain, shortness of breath, mood changes.  Positive muscle aches  Objective  Blood pressure 104/80, pulse 77, height 5\' 3"  (1.6 m), weight 137 lb (62.1 kg), SpO2 98 %.   Systems examined below as of 11/09/16 General: NAD A&O x3 mood, affect normal  HEENT: Pupils equal, extraocular movements intact no nystagmus Respiratory: not short of breath at rest or with speaking Cardiovascular: No lower extremity edema, non tender Skin: Warm dry intact with no signs of infection or rash on extremities or on axial skeleton. Abdomen: Soft nontender, no masses Neuro: Cranial nerves  intact, neurovascularly intact in all extremities with 2+ DTRs and 2+ pulses. Lymph: No lymphadenopathy appreciated today  Gait normal with good balance and coordination.  MSK: Non tender with full range of motion and good stability and symmetric strength and tone of shoulders, elbows, wrist,  knee hips and ankles bilaterally.   Neck: Inspection unremarkable. No palpable stepoffs. Negative Spurling's maneuver. Continues lacks 5 and side  bending and extension of the neck Grip strength and sensation normal in bilateral hands Strength good C4 to T1 distribution No sensory change to C4 to T1 Negative Hoffman sign bilaterally Reflexes normal When difficulty still over that John as well as going up the sternocleidomastoid bilaterally.  Osteopathic findings C2 flexed rotated and side bent right C4 flexed rotated and side bent left T3 extended rotated and side bent right inhaled third rib T7 extended rotated and side bent left       Impression and Recommendations:     This case required medical decision making of moderate complexity.      Note: This dictation was prepared with Dragon dictation along with smaller phrase technology. Any transcriptional errors that result from this process are unintentional.

## 2016-11-09 ENCOUNTER — Ambulatory Visit (INDEPENDENT_AMBULATORY_CARE_PROVIDER_SITE_OTHER): Payer: BLUE CROSS/BLUE SHIELD | Admitting: Family Medicine

## 2016-11-09 ENCOUNTER — Encounter: Payer: Self-pay | Admitting: Family Medicine

## 2016-11-09 VITALS — BP 104/80 | HR 77 | Ht 63.0 in | Wt 137.0 lb

## 2016-11-09 DIAGNOSIS — M999 Biomechanical lesion, unspecified: Secondary | ICD-10-CM | POA: Diagnosis not present

## 2016-11-09 DIAGNOSIS — M5030 Other cervical disc degeneration, unspecified cervical region: Secondary | ICD-10-CM | POA: Diagnosis not present

## 2016-11-09 NOTE — Assessment & Plan Note (Signed)
Patient does have arthritis noted. Patient has been on chemotherapy and this could be potential drug reaction. Continue the vitamin D. Attempted osteopathic manipulation. No significant changes in her treatment options. Continue to give time. Follow-up again in 4-6 weeks

## 2016-11-09 NOTE — Patient Instructions (Signed)
Good to see you  You are doing better  I hope this helps Continue the vitamins Keep working on the posture OK to do Yoga 2 times a week for next 2 weeks then 3 times a week thereafter Maybe a little weight training when you feel up to it See me again in 5 weeks

## 2016-11-09 NOTE — Assessment & Plan Note (Signed)
Decision today to treat with OMT was based on Physical Exam  After verbal consent patient was treated with HVLA, ME, FPR techniques in cervical, thoracic, rib areas  Patient tolerated the procedure well with improvement in symptoms  Patient given exercises, stretches and lifestyle modifications  See medications in patient instructions if given  Patient will follow up in 4 weeks 

## 2016-11-10 ENCOUNTER — Other Ambulatory Visit: Payer: BLUE CROSS/BLUE SHIELD

## 2016-11-10 ENCOUNTER — Ambulatory Visit: Payer: BLUE CROSS/BLUE SHIELD

## 2016-11-23 ENCOUNTER — Encounter: Payer: Self-pay | Admitting: *Deleted

## 2016-11-30 MED FILL — ZOLPIDEM TARTRATE 5 MG TAB: 5 | 30 days supply | Qty: 30 | Fill #0

## 2016-12-02 ENCOUNTER — Ambulatory Visit: Payer: BLUE CROSS/BLUE SHIELD

## 2016-12-02 ENCOUNTER — Ambulatory Visit: Payer: BLUE CROSS/BLUE SHIELD | Admitting: Hematology and Oncology

## 2016-12-02 ENCOUNTER — Other Ambulatory Visit: Payer: BLUE CROSS/BLUE SHIELD

## 2016-12-22 MED FILL — LETROZOLE 2.5 MG TABLET: 2.5 | 90 days supply | Qty: 90 | Fill #2

## 2017-01-04 ENCOUNTER — Ambulatory Visit
Admission: RE | Admit: 2017-01-04 | Discharge: 2017-01-04 | Disposition: A | Discharge status: Home / Self Care | Payer: BLUE CROSS/BLUE SHIELD | Source: Ambulatory Visit | Attending: Hematology and Oncology | Admitting: Hematology and Oncology

## 2017-01-04 DIAGNOSIS — C50211 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 female breast: Secondary | ICD-10-CM

## 2017-01-04 DIAGNOSIS — Z170 Estrogen receptor positive status [ER+]: Secondary | ICD-10-CM

## 2017-01-11 MED FILL — ZOLPIDEM TARTRATE 5 MG TAB: 5 | 30 days supply | Qty: 30 | Fill #1

## 2017-02-01 ENCOUNTER — Encounter: Payer: Self-pay | Admitting: Family Medicine

## 2017-02-01 ENCOUNTER — Ambulatory Visit (INDEPENDENT_AMBULATORY_CARE_PROVIDER_SITE_OTHER): Payer: BLUE CROSS/BLUE SHIELD | Admitting: Family Medicine

## 2017-02-01 ENCOUNTER — Other Ambulatory Visit (INDEPENDENT_AMBULATORY_CARE_PROVIDER_SITE_OTHER): Payer: BLUE CROSS/BLUE SHIELD

## 2017-02-01 VITALS — BP 110/76 | HR 90 | Ht 63.0 in | Wt 136.0 lb

## 2017-02-01 DIAGNOSIS — M2550 Pain in unspecified joint: Secondary | ICD-10-CM

## 2017-02-01 DIAGNOSIS — M5030 Other cervical disc degeneration, unspecified cervical region: Secondary | ICD-10-CM | POA: Diagnosis not present

## 2017-02-01 DIAGNOSIS — M999 Biomechanical lesion, unspecified: Secondary | ICD-10-CM

## 2017-02-01 DIAGNOSIS — Z23 Encounter for immunization: Secondary | ICD-10-CM

## 2017-02-01 LAB — IBC PANEL
Iron: 102 ug/dL (ref 42–145)
Saturation Ratios: 25.7 % (ref 20.0–50.0)
Transferrin: 284 mg/dL (ref 212.0–360.0)

## 2017-02-01 LAB — VITAMIN D 25 HYDROXY (VIT D DEFICIENCY, FRACTURES): VITD: 43.96 ng/mL (ref 30.00–100.00)

## 2017-02-01 LAB — SEDIMENTATION RATE: SED RATE: 4 mm/h (ref 0–30)

## 2017-02-01 MED ORDER — VITAMIN D (ERGOCALCIFEROL) 1.25 MG (50000 UNIT) PO CAPS: 50000.0000 [IU] | ORAL_CAPSULE | ORAL | 0 refills | Status: DC

## 2017-02-01 NOTE — Patient Instructions (Addendum)
Good to see you  Patricia Chandler is your friend.  Once weekly vitamin D for 12 weeks.  We will get labs downstairs  Keep up being active.  Could read about Prolia and see what you think.  Do not think you need it yet but may need to consider.  See me again in 4-6 weeks.

## 2017-02-01 NOTE — Progress Notes (Signed)
[name] [place], [place] [address] Phone: [phone] Subjective:    I'm seeing this patient by the request  of:    CC: Neck pain follow-up  [id number]  Patricia Chandler is a 61 y.o. female coming in for follow up for cervical spine pain. She has been traveling so her neck has been sore. Patient is an traveling recently. Feels that that may being cause some more increase in discomfort. Has not been doing the exercises regularly. Has responded fairly well to osteopathic manipulation previously.  Patient also recently diagnosed with osteoporosis. Was not given any treatment plan at the moment. Patient is wondering what she should or should not be doing. Trying to start a new workout routine on a more regular basis.      Past Medical History:  Diagnosis Date  . Anemia    due to chemo  . Anxiety   . Cardiomyopathy ([facility])    mild, per cardiology note; due to Herceptin  . Dental crowns present   . Family history of adverse reaction to anesthesia    pt's daughter has hx. of post-op nausea  . GERD (gastroesophageal reflux disease)   . History of breast cancer 2017   right  . History of chemotherapy    finished chemo 04/15/2016  . Rash 08/04/2016   under right arm - states recent tick bite; to see MD 08/05/2016  . Runny nose 06/06/2016   clear drainage, per pt.   Past Surgical History:  Procedure Laterality Date  . BREAST ENHANCEMENT SURGERY Bilateral 2005  . BREAST RECONSTRUCTION WITH PLACEMENT OF TISSUE EXPANDER AND FLEX HD (ACELLULAR HYDRATED DERMIS) Right 05/19/2016   Procedure: RIGHT BREAST RECONSTRUCTION WITH PLACEMENT OF TISSUE EXPANDER AND  ALLODERM.;  Surgeon: Irene Limbo, MD;  Location: [facility];  Service: Plastics;  Laterality: Right;  . COLONOSCOPY    . COLONOSCOPY WITH PROPOFOL  10/02/2014  . DEBRIDEMENT AND CLOSURE WOUND Right 06/08/2016   Procedure: DEBRIDEMENT RIGHT MASTECTOMY FLAP, TISSUE  EXPANSION OF RIGHT CHEST;  Surgeon: Irene Limbo, MD;  Location: [facility];  Service: Plastics;  Laterality: Right;  . MASTOPEXY Left 08/11/2016   Procedure: LEFT BREAST MASTOPEXY;  Surgeon: Irene Limbo, MD;  Location: [facility];  Service: Plastics;  Laterality: Left;  . NIPPLE SPARING MASTECTOMY/SENTINAL LYMPH NODE BIOPSY/RECONSTRUCTION/PLACEMENT OF TISSUE EXPANDER Right 05/19/2016   Procedure: RIGHT NIPPLE SPARING MASTECTOMY WITH SENTINAL LYMPH NODE BIOPSY WITH BLUE DYE INJECTION;  Surgeon: Rolm Bookbinder, MD;  Location: [facility];  Service: General;  Laterality: Right;  . PORTACATH PLACEMENT N/A 12/30/2015   Procedure: INSERTION PORT-A-CATH WITH [place];  Surgeon: Rolm Bookbinder, MD;  Location: [facility];  Service: General;  Laterality: N/A;  . REMOVAL OF TISSUE EXPANDER AND PLACEMENT OF IMPLANT Right 08/11/2016   Procedure: REMOVAL OF RIGHT  TISSUE EXPANDER AND PLACEMENT OF IMPLANT;  Surgeon: Irene Limbo, MD;  Location: [facility];  Service: Plastics;  Laterality: Right;   Social History   Social History  . Marital status: Married    Spouse name: N/A  . Number of children: 2  . Years of education: N/A   Occupational History  . medical receptionist    Social History Main Topics  . Smoking status: Never Smoker  . Smokeless tobacco: Never Used  . Alcohol use Yes     Comment: occasionally  . Drug use: No  . Sexual activity: Not Asked   Other Topics Concern  .  None   Social History Narrative   Married 1978 (Al). 2 girls 33 and 36 in 2018. [name] lives in [place], [place] with 4 yo son (single right now). and [name] in [place], [place]--> now [place] with husband and 41 year old daughter (born with heart defect with missing valve and initially not closed VSD--still weighs heavily on family- likely will have to be repaired).       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    Graduated from [place]. [employer] prior.       Hobbies: gardening, decorating, sewing, enjoys creating   No Known Allergies Family History  Problem Relation Age of Onset  . Breast cancer Maternal Aunt        dx in her 55s  . Stroke Father        late 68s  . Hypertension Father   . Pulmonary fibrosis Mother   . Breast cancer Paternal Aunt 78  . Cancer Paternal Grandmother        abdominal; cervical vs uterine dx in her 7s  . Parkinson's disease Maternal Grandfather   . Anesthesia problems Daughter        post-op nausea     Past medical history, social, surgical and family history all reviewed in electronic medical record.  No pertanent information unless stated regarding to the chief complaint.   Review of Systems:Review of systems updated and as accurate as of 02/01/17  No headache, visual changes, nausea, vomiting, diarrhea, constipation, dizziness, abdominal pain, skin rash, fevers, chills, night sweats, weight loss, swollen lymph nodes, body aches, joint swelling, chest pain, shortness of breath, mood changes. Positive muscle aches, mild headaches  Objective  Blood pressure 110/76, pulse 90, height 5\' 3"  (1.6 m), weight 136 lb (61.7 kg), SpO2 97 %. Systems examined below as of 02/01/17   General: No apparent distress alert and oriented x3 mood and affect normal, dressed appropriately.  HEENT: Pupils equal, extraocular movements intact  Respiratory: Patient's speak in full sentences and does not appear short of breath  Cardiovascular: No lower extremity edema, non tender, no erythema  Skin: Warm dry intact with no signs of infection or rash on extremities or on axial skeleton.  Abdomen: Soft nontender  Neuro: Cranial nerves II through XII are intact, neurovascularly intact in all extremities with 2+ DTRs and 2+ pulses.  Lymph: No lymphadenopathy of posterior or anterior cervical chain or axillae bilaterally.  Gait normal with good  balance and coordination.  MSK:  Non tender with full range of motion and good stability and symmetric strength and tone of shoulders, elbows, wrist, hip, knee and ankles bilaterally.  Neck: Inspection mild loss of lordosis. No palpable stepoffs. Negative Spurling's maneuver. Full neck range of motion Grip strength and sensation normal in bilateral hands Strength good C4 to T1 distribution No sensory change to C4 to T1 Negative Hoffman sign bilaterally Reflexes normal   Osteopathic findings C2 flexed rotated and side bent right C4 flexed rotated and side bent left C7 flexed rotated and side bent left T3 extended rotated and side bent right inhaled third rib T11 extended rotated and side bent left L2 flexed rotated and side bent right Sacrum right on right    Impression and Recommendations:     This case required medical decision making of moderate complexity.      Note: This dictation was prepared with Dragon dictation along with smaller phrase technology. Any  transcriptional errors that result from this process are unintentional.

## 2017-02-01 NOTE — Assessment & Plan Note (Signed)
Decision today to treat with OMT was based on Physical Exam  After verbal consent patient was treated with HVLA, ME, FPR techniques in cervical, thoracic, lumbar and sacral areas  Patient tolerated the procedure well with improvement in symptoms  Patient given exercises, stretches and lifestyle modifications  See medications in patient instructions if given  Patient will follow up in 6-8 weeks 

## 2017-02-01 NOTE — Assessment & Plan Note (Signed)
Known arthritic changes. Discussed with patient at great length, we discussed home exercises and icing regimen, we discussed which activities doing which ones to avoid. Discussed the importance of posture topics. Patient follow-up with me again in 6-8 weeks.

## 2017-02-02 ENCOUNTER — Encounter: Payer: Self-pay | Admitting: Family Medicine

## 2017-02-02 LAB — PTH, INTACT AND CALCIUM
Calcium: 9.6 mg/dL (ref 8.6–10.4)
PTH: 69 pg/mL — ABNORMAL HIGH (ref 14–64)

## 2017-02-02 LAB — CALCIUM, IONIZED: Calcium, Ion: 5.2 mg/dL (ref 4.8–5.6)

## 2017-02-22 ENCOUNTER — Other Ambulatory Visit: Payer: Self-pay | Admitting: Hematology and Oncology

## 2017-02-22 DIAGNOSIS — Z1231 Encounter for screening mammogram for malignant neoplasm of breast: Secondary | ICD-10-CM

## 2017-03-06 NOTE — Progress Notes (Signed)
[name] [place], [place] [address] Phone: [phone] Subjective:     CC: Neck and back pain follow-up  [id number]  Patricia Chandler is a 61 y.o. female coming in with complaint of neck and back pain.  Has been seen and responding somewhat to osteopathic manipulation.  Patient is tried to increase her activity after her treatment for breast cancer.  She did have workup with laboratory recently that was unremarkable.  Patient states she has been experiencing right shoulder pain. She's been experiencing pain for about 2 months. She had a lymph node removed from her right side. She experiences her pain at night. It keeps her up at night. She says that sometimes it moves into her shoulder. Her pain is in the lateral shoulder.        Past Medical History:  Diagnosis Date  . Anemia    due to chemo  . Anxiety   . Cardiomyopathy ([facility])    mild, per cardiology note; due to Herceptin  . Dental crowns present   . Family history of adverse reaction to anesthesia    pt's daughter has hx. of post-op nausea  . GERD (gastroesophageal reflux disease)   . History of breast cancer 2017   right  . History of chemotherapy    finished chemo 04/15/2016  . Rash 08/04/2016   under right arm - states recent tick bite; to see MD 08/05/2016  . Runny nose 06/06/2016   clear drainage, per pt.   Past Surgical History:  Procedure Laterality Date  . BREAST ENHANCEMENT SURGERY Bilateral 2005  . COLONOSCOPY    . COLONOSCOPY WITH PROPOFOL  10/02/2014  . DEBRIDEMENT RIGHT MASTECTOMY FLAP, TISSUE EXPANSION OF RIGHT CHEST Right 06/08/2016   Performed by Irene Limbo, MD at [facility]  . INSERTION PORT-A-CATH WITH [place] N/A 12/30/2015   Performed by Rolm [facility]binder, [facility] at [facility]  . LEFT BREAST MASTOPEXY Left 08/11/2016   Performed by [facility]bo, MD at [facility]  . REMOVAL OF RIGHT  TISSUE EXPANDER AND PLACEMENT OF  IMPLANT Right 08/11/2016   Performed by Irene [facility]bo, MD at [facility]  . RIGHT BREAST RECONSTRUCTION WITH PLACEMENT OF TISSUE EXPANDER AND  ALLODERM. Right 05/19/2016   Performed by Irene Limbo, MD at [facility]  . RIGHT NIPPLE SPARING MASTECTOMY WITH SENTINAL LYMPH NODE BIOPSY WITH BLUE DYE INJECTION Right 05/19/2016   Performed by Rolm Bookbinder, MD at [facility] History   Socioeconomic History  . Marital status: Married    Spouse name: Not on file  . Number of children: 2  . Years of education: Not on file  . Highest education level: Not on file  Social Needs  . Financial resource strain: Not on file  . Food insecurity - worry: Not on file  . Food insecurity - inability: Not on file  . Transportation needs - medical: Not on file  . Transportation needs - non-medical: Not on file  Occupational History  . Occupation: medical receptionist  Tobacco Use  . Smoking status: Never Smoker  . Smokeless tobacco: Never Used  Substance and Sexual Activity  . Alcohol use: Yes    Comment: occasionally  . Drug use: No  . Sexual activity: Not on file  Other Topics Concern  . Not on file  Social History Narrative   Married 1978 (Al). 2 girls 33 and 36 in 2018. [name] Kathlee Nations)  lives in [place], [place] with 34 yo son (single right now). and [name] in [place], [place]--> now [place] with husband and 60 year old daughter (born with heart defect with missing valve and initially not closed VSD--still weighs heavily on family- likely will have to be repaired).       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    Graduated from [employer]. [employer] prior.       Hobbies: gardening, decorating, sewing, enjoys creating   No Known Allergies Family History  Problem Relation Age of Onset  . Breast cancer Maternal Aunt        dx in her 62s  . Stroke Father        late  47s  . Hypertension Father   . Pulmonary fibrosis Mother   . Breast cancer Paternal Aunt 78  . Cancer Paternal Grandmother        abdominal; cervical vs uterine dx in her 37s  . Parkinson's disease Maternal Grandfather   . Anesthesia problems Daughter        post-op nausea     Past medical history, social, surgical and family history all reviewed in electronic medical record.  No pertanent information unless stated regarding to the chief complaint.   Review of Systems:Review of systems updated and as accurate as of 03/06/17  No , visual changes, nausea, vomiting, diarrhea, constipation, dizziness, abdominal pain, skin rash, fevers, chills, night sweats, weight loss, swollen lymph nodes, body aches, joint swelling,, chest pain, shortness of breath, mood changes.  Positive muscle aches and headaches  Objective  There were no vitals taken for this visit. Systems examined below as of 03/06/17   General: No apparent distress alert and oriented x3 mood and affect normal, dressed appropriately.  HEENT: Pupils equal, extraocular movements intact  Respiratory: Patient's speak in full sentences and does not appear short of breath  Cardiovascular: No lower extremity edema, non tender, no erythema  Skin: Warm dry intact with no signs of infection or rash on extremities or on axial skeleton.  Abdomen: Soft nontender  Neuro: Cranial nerves II through XII are intact, neurovascularly intact in all extremities with 2+ DTRs and 2+ pulses.  Lymph: No lymphadenopathy of posterior or anterior cervical chain or axillae bilaterally.  Gait normal with good balance and coordination.  MSK:  Non tender with full range of motion and good stability and symmetric strength and tone of  elbows, wrist, hip, knee and ankles bilaterally.  Shoulder: left Inspection reveals no abnormalities, atrophy or asymmetry. Palpation is normal with no tenderness over AC joint or bicipital groove. ROM is full in all planes  passively. Rotator cuff strength normal throughout. signs of impingement with positive Neer and Hawkin's tests, but negative empty can sign. Speeds and Yergason's tests normal. No labral pathology noted with negative Obrien's, negative clunk and good stability. Normal scapular function observed. No painful arc and no drop arm sign. No apprehension sign  MSK US performed of: left This study was ordered, performed, and interpreted by Charlann Boxer D.O.  Shoulder:   Supraspinatus:  Appears normal on long and transverse views, Bursal bulge seen with shoulder abduction on impingement view. Infraspinatus:  Appears normal on long and transverse views. Significant increase in Doppler flow Subscapularis:  Appears normal on long and transverse views. Positive bursa Teres Minor:  Appears normal on long and transverse views. AC joint:  Capsule undistended, no geyser sign. Glenohumeral Joint:  Appears normal without effusion. Glenoid Labrum:  Intact without visualized tears. Biceps Tendon:  Appears normal on long and transverse views, no fraying of tendon, tendon located in intertubercular groove, no subluxation with shoulder internal or external rotation.  Impression: Subacromial bursitis  Neck: Inspection unremarkable. No palpable stepoffs. Negative Spurling's maneuver. Mild limitation in rotation bilaterally Grip strength and sensation normal in bilateral hands Strength good C4 to T1 distribution No sensory change to C4 to T1 Negative Hoffman sign bilaterally Reflexes normal  Osteopathic findings C2 flexed rotated and side bent right C4 flexed rotated and side bent left C7 flexed rotated and side bent left T3 extended rotated and side bent right inhaled third rib T9 extended rotated and side bent left L2 flexed rotated and side bent right Sacrum right on right    Impression and Recommendations:     This case required medical decision making of moderate complexity.      Note:  This dictation was prepared with Dragon dictation along with smaller phrase technology. Any transcriptional errors that result from this process are unintentional.

## 2017-03-08 ENCOUNTER — Ambulatory Visit (INDEPENDENT_AMBULATORY_CARE_PROVIDER_SITE_OTHER): Payer: BLUE CROSS/BLUE SHIELD | Admitting: Family Medicine

## 2017-03-08 ENCOUNTER — Ambulatory Visit
Admission: RE | Admit: 2017-03-08 | Discharge: 2017-03-08 | Disposition: A | Discharge status: Home / Self Care | Payer: BLUE CROSS/BLUE SHIELD | Source: Ambulatory Visit | Attending: Hematology and Oncology | Admitting: Hematology and Oncology

## 2017-03-08 ENCOUNTER — Encounter: Payer: Self-pay | Admitting: Family Medicine

## 2017-03-08 ENCOUNTER — Ambulatory Visit: Payer: Self-pay

## 2017-03-08 VITALS — BP 114/80 | HR 74 | Ht 63.0 in | Wt 141.0 lb

## 2017-03-08 DIAGNOSIS — G8929 Other chronic pain: Secondary | ICD-10-CM

## 2017-03-08 DIAGNOSIS — M25511 Pain in right shoulder: Principal | ICD-10-CM

## 2017-03-08 DIAGNOSIS — M5030 Other cervical disc degeneration, unspecified cervical region: Secondary | ICD-10-CM

## 2017-03-08 DIAGNOSIS — M999 Biomechanical lesion, unspecified: Secondary | ICD-10-CM

## 2017-03-08 DIAGNOSIS — Z1231 Encounter for screening mammogram for malignant neoplasm of breast: Secondary | ICD-10-CM

## 2017-03-08 DIAGNOSIS — M7552 Bursitis of left shoulder: Secondary | ICD-10-CM | POA: Diagnosis not present

## 2017-03-08 HISTORY — DX: Personal history of antineoplastic chemotherapy: Z92.21

## 2017-03-08 MED ORDER — GABAPENTIN 100 MG PO CAPS: 200.0000 mg | ORAL_CAPSULE | Freq: Every day | ORAL | 3 refills | Status: DC

## 2017-03-08 MED ORDER — MELOXICAM 7.5 MG PO TABS: 7.5000 mg | ORAL_TABLET | Freq: Every day | ORAL | 0 refills | Status: DC

## 2017-03-08 MED FILL — MELOXICAM 7.5 MG TABLET: 7.5 | 30 days supply | Qty: 30 | Fill #0

## 2017-03-08 MED FILL — GABAPENTIN 100 MG CAPS: 100 | 30 days supply | Qty: 60 | Fill #0

## 2017-03-08 NOTE — Assessment & Plan Note (Signed)
Patient does have shoulder bursitis.  Given home exercise.  Discussed proper ergonomics.  Patient will follow-up again 4 weeks

## 2017-03-08 NOTE — Patient Instructions (Addendum)
Good to see you  When in a lot of pain consider meloxicam daily for 3 days then as needed.  Also consider gabapentin 200mg  at night to help if this is nerve pain around the jaw.  You will do great  For mattress, I know Al will kill me, kingdown, foster and son or beauty rest black  Start the shoulder exercises again  Keep hands within peripheral vision See me again in 4 weeks

## 2017-03-08 NOTE — Assessment & Plan Note (Signed)
Decision today to treat with OMT was based on Physical Exam  After verbal consent patient was treated with HVLA, ME, FPR techniques in cervical, thoracic, lumbar and sacral areas  Patient tolerated the procedure well with improvement in symptoms  Patient given exercises, stretches and lifestyle modifications  See medications in patient instructions if given  Patient will follow up in 4-6 weeks 

## 2017-03-08 NOTE — Assessment & Plan Note (Signed)
Degenerative disc disease.  We discussed that this could be contributing to some of her TMJ symptoms.  And started on low-dose gabapentin as well as meloxicam.  Follow-up again in 4-6 weeks.

## 2017-03-24 ENCOUNTER — Ambulatory Visit (HOSPITAL_BASED_OUTPATIENT_CLINIC_OR_DEPARTMENT_OTHER): Payer: BLUE CROSS/BLUE SHIELD | Admitting: Hematology and Oncology

## 2017-03-24 ENCOUNTER — Other Ambulatory Visit: Payer: Self-pay | Admitting: Hematology and Oncology

## 2017-03-24 DIAGNOSIS — Z170 Estrogen receptor positive status [ER+]: Secondary | ICD-10-CM

## 2017-03-24 DIAGNOSIS — M810 Age-related osteoporosis without current pathological fracture: Secondary | ICD-10-CM | POA: Diagnosis not present

## 2017-03-24 DIAGNOSIS — C50211 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 female breast: Secondary | ICD-10-CM | POA: Diagnosis not present

## 2017-03-24 MED ORDER — ALENDRONATE SODIUM 70 MG PO TABS: 70.0000 mg | ORAL_TABLET | ORAL | 3 refills | Status: DC

## 2017-03-24 MED FILL — LETROZOLE 2.5 MG TABLET: 2.5 | 90 days supply | Qty: 90 | Fill #3

## 2017-03-24 NOTE — Progress Notes (Signed)
Patient Care Team: [name], MD as PCP - General (Family Medicine)  DIAGNOSIS:  Encounter Diagnosis  Name Primary?  . 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 breast in female, estrogen receptor positive ([facility])     SUMMARY OF ONCOLOGIC HISTORY:   Breast cancer of upper-inner quadrant of right female breast ([place])   12/05/2015 Mammogram    Right breast mass 2:00 4 cm from nipple: 1.7 cm, T1c N0 stage IA; right breast 2:00 6 cm from nipple 1.3 cm mass, 7 mm satellite nodule between the 2 masses, T1 cN0 stage IA      12/06/2015 Initial Diagnosis    Right breast biopsy 2:00 6 cm from nipple: Grade 2-3 invasive ductal cancer, ER 90%, PR 2%, Ki-67 80%, HER-2 positive ratio 2.07; right biopsy 2:00 4 cm from nipple: IDC with DCIS, ER 95%, PR 10%, Ki-67 90%, HER-2 negative ratio 1.27      12/18/2015 Breast MRI    3 enhancing masses in the upper inner quadrant right breast spanning an area 3.7 cm (1.7 cm, 1 cm, 1.4 cm) no lymph node enlargement      12/31/2015 - 04/15/2016 Neo-Adjuvant Chemotherapy    [facility] Perjeta 6 cycles followed by Herceptin, Perjeta maintenance for 1 year completed 10/20/2016      01/08/2016 - 01/12/2016 Hospital Admission    Neutropenic fever hospitalization (because patient did not receive Neulasta with cycle 1)      01/10/2016 Miscellaneous    Genetic testing was normal did not reveal any mutations      04/16/2016 Breast MRI    Near CR to therapy. Previous 2 biopsied massses are no longer seen. Third satellite lesion is smaller from 10 mm to 5.3 mm, no abnormal LN.      05/19/2016 Surgery    Right mastectomy: IDC grade 3, 1.3 cm, low-grade DCIS, LVIDS present, margins negative, 0/1 lymph node negative, ypT1CypN0 stage IA; repeat HER-2 negative ratio 1.41      05/19/2016 Surgery    Right breast reconstruction with tissue expander. Acellular dermis for breast reconstruction (Dr.Thimappa)       06/22/2016 -  Anti-estrogen oral therapy    Letrozole  2.5 mg daily       CHIEF COMPLIANT: Follow-up on letrozole therapy  INTERVAL HISTORY: Patricia Chandler is a 61 year old with above-mentioned history of right breast cancer treated with neoadjuvant chemotherapy followed by mastectomy and reconstruction.  She is currently on oral antiestrogen therapy with letrozole.  She is tolerating letrozole fairly well without any major problems or concerns.  Denies any hot flashes or myalgias.  She complains of change in her jaw structure.  She feels that the right side is too tight on the left side does not close properly so she has difficulty with chewing foods.  She has seen multiple dental professionals and they cannot seem to figure out what to do.  REVIEW OF SYSTEMS:   Constitutional: Denies fevers, chills or abnormal weight loss Eyes: Denies blurriness of vision Ears, nose, mouth, throat, and face: Denies mucositis or sore throat Respiratory: Denies cough, dyspnea or wheezes Cardiovascular: Denies palpitation, chest discomfort Gastrointestinal:  Denies nausea, heartburn or change in bowel habits Skin: Denies abnormal skin rashes Lymphatics: Denies new lymphadenopathy or easy bruising Neurological:Denies numbness, tingling or new weaknesses Behavioral/Psych: Mood is stable, no new changes  Extremities: No lower extremity edema Breast:  denies any pain or lumps or nodules in either breasts All other systems were reviewed with the patient and are negative.  I have reviewed the past medical history, past surgical history, social history and family history with the patient and they are unchanged from previous note.  ALLERGIES:  has No Known Allergies.  MEDICATIONS:  Current Outpatient Medications  Medication Sig Dispense Refill  . gabapentin (NEURONTIN) 100 MG capsule Take 2 capsules (200 mg total) at bedtime by mouth. 60 capsule 3  . letrozole (FEMARA) 2.5 MG tablet Take 2.5 mg by mouth daily.    . meloxicam (MOBIC) 7.5 MG tablet Take 1  tablet (7.5 mg total) daily by mouth. 30 tablet 0  . Vitamin D, Ergocalciferol, (DRISDOL) 50000 units CAPS capsule Take 1 capsule (50,000 Units total) by mouth every 7 (seven) days. 12 capsule 0  . zolpidem (AMBIEN) 5 MG tablet TAKE 1 TABLET BY MOUTH ONCE DAILY AT BEDTIME AS NEEDED FOR SLEEP 30 tablet 0   No current facility-administered medications for this visit.     PHYSICAL EXAMINATION: ECOG PERFORMANCE STATUS: 1 - Symptomatic but completely ambulatory  Vitals:   03/24/17 1437  BP: 132/76  Pulse: 96  Resp: 18  Temp: 97.9 F (36.6 C)  SpO2: 99%   Filed Weights   03/24/17 1437  Weight: 140 lb 4.8 oz (63.6 kg)    GENERAL:alert, no distress and comfortable SKIN: skin color, texture, turgor are normal, no rashes or significant lesions EYES: normal, Conjunctiva are pink and non-injected, sclera clear OROPHARYNX:no exudate, no erythema and lips, buccal mucosa, and tongue normal  NECK: supple, thyroid normal size, non-tender, without nodularity LYMPH:  no palpable lymphadenopathy in the cervical, axillary or inguinal LUNGS: clear to auscultation and percussion with normal breathing effort HEART: regular rate & rhythm and no murmurs and no lower extremity edema ABDOMEN:abdomen soft, non-tender and normal bowel sounds MUSCULOSKELETAL:no cyanosis of digits and no clubbing  NEURO: alert & oriented x 3 with fluent speech, no focal motor/sensory deficits EXTREMITIES: No lower extremity edema BREAST: No palpable masses or nodules in either right or left breasts. No palpable axillary supraclavicular or infraclavicular adenopathy no breast tenderness or nipple discharge. (exam performed in the presence of a chaperone)  LABORATORY DATA:  I have reviewed the data as listed   Chemistry      Component Value Date/Time   NA 140 10/20/2016 0757   K 3.9 10/20/2016 0757   CL 111 01/12/2016 0520   CO2 24 10/20/2016 0757   BUN 22.7 10/20/2016 0757   CREATININE 0.8 10/20/2016 0757        Component Value Date/Time   CALCIUM 9.6 02/01/2017 1124   CALCIUM 9.3 10/20/2016 0757   ALKPHOS 54 10/20/2016 0757   AST 16 10/20/2016 0757   ALT 16 10/20/2016 0757   BILITOT 0.39 10/20/2016 0757       Lab Results  Component Value Date   WBC 5.5 10/20/2016   HGB 11.3 (L) 10/20/2016   HCT 32.9 (L) 10/20/2016   MCV 89.2 10/20/2016   PLT 186 10/20/2016   NEUTROABS 3.0 10/20/2016    ASSESSMENT & PLAN:  Breast cancer of upper-inner quadrant of right female breast ([place]) Right breast biopsy 2:00 6 cm from nipple: Grade 2-3 invasive ductal cancer, ER 90%, PR 2%, Ki-67 80%, HER-2 positive ratio 2.07; right biopsy 2:00 4 cm from nipple: IDC with DCIS, ER 95%, PR 10%, Ki-67 90%, HER-2 negative ratio 1.27 Breast MRI08/30/2017: 3 enhancing masses in the upper inner quadrant right breast spanning an area 3.7 cm (1.7 cm, 1 cm, 1.4 cm) no lymph node enlargement Clinical stage: T2N0 (stage 2A)  Treatment plan: 1. Genetic counseling: Normal did not reveal any abnormal mutations 2. Neoadjuvant chemotherapy. [facility] Perjeta 6 cycles followed by Herceptin And Perjetamaintenance completed 10/20/2016) from 12/31/15 to 04/15/16 3. Right mastectomy with reconstruction: 05/19/2016:Right mastectomy: IDC grade 3, 1.3 cm, low-grade DCIS, LVIDS present, margins negative, 0/1 lymph node negative, ypT1CypN0 stage IA; repeat HER-2 negative ratio 1.41 4. Adjuvant antiestrogen therapy With letrozole 2.5 mg daily to be started March 2018 -------------------------------------------------------------------------------------------------------------------------------------------------- Current treatment:  Antiestrogen therapy with letrozole 2.5 mg daily started March 2018 Patient does not have any side effects to letrozole therapy.  Bone density 01/04/2017: T score -2.7 osteoporosis Osteoporosis: Patient will need bisphosphonate therapy.  She will start Fosamax once a week.  We had lengthy discussions regarding the  need for annual breast MRI.  I discussed with her that routine breast MRI is for someone who does not have a high familial/genetic risk of breast cancer recurrence is probably not necessary.  Her breasts are not terribly dense either.  I encouraged her that we can follow her with physical examinations and mammograms once a year.  She will call me if she wishes to undergo the breast MRI.  If she decides to do it we will plan to do it in June 2019.  Return to clinic in 1 year   I spent 25 minutes talking to the patient of which more than half was spent in counseling and coordination of care.  No orders of the defined types were placed in this encounter.  The patient has a good understanding of the overall plan. she agrees with it. she will call with any problems that may develop before the next visit here.   Rulon Eisenmenger, MD 03/24/17

## 2017-03-24 NOTE — Assessment & Plan Note (Addendum)
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 Genetic counseling: Normal did not reveal any abnormal mutations 2. Neoadjuvant chemotherapy. [facility] Perjeta 6 cycles followed by Herceptin And Perjetamaintenance completed 10/20/2016) from 12/31/15 to 04/15/16 3. Right mastectomy with reconstruction: 05/19/2016:Right mastectomy: IDC grade 3, 1.3 cm, low-grade DCIS, LVIDS present, margins negative, 0/1 lymph node negative, ypT1CypN0 stage IA; repeat HER-2 negative ratio 1.41 4. Adjuvant antiestrogen therapy With letrozole 2.5 mg daily to be started March 2018 -------------------------------------------------------------------------------------------------------------------------------------------------- Current treatment: 1. Antiestrogen therapy with letrozole 2.5 mg daily started March 2018 2. Consideration for Neratinib   Bone density 01/04/2017: T score -2.7 osteoporosis Osteoporosis: Patient will need bisphosphonate therapy.  Return to clinic in 1 year

## 2017-03-25 MED FILL — ZOLPIDEM TARTRATE 5 MG TABL: 5 | 30 days supply | Qty: 30 | Fill #0

## 2017-03-26 ENCOUNTER — Telehealth: Payer: Self-pay | Admitting: Hematology and Oncology

## 2017-03-26 NOTE — Telephone Encounter (Signed)
Spoke to patient regarding upcoming June appointments  °

## 2017-04-03 NOTE — Progress Notes (Signed)
[facility] [address] [place], [place] [address] Phone: [phone] Subjective:      CC: Neck and upper back pain follow-up  [id number]  Patricia Chandler is a 61 y.o. female coming in with complaint of neck and upper back pain.  Has been doing fairly well with conservative therapy.  Been taking gabapentin at night.  Patient is also doing once weekly vitamin D.  Responded well to osteopathic manipulation and home exercises.  Patient states that she has been doing good. She is trying to be more mindful of times during the day where she is putting herself in positions that cause poor posture. She continues to work on the exercises for posture as well. She is not using gabapentin as she does not like the side effects. She reports feeling tired and slow to start in the morning when she uses it.       Past Medical History:  Diagnosis Date  . Anemia    due to chemo  . Anxiety   . Breast cancer ([place])   . Cardiomyopathy ([place])    mild, per cardiology note; due to Herceptin  . Dental crowns present   . Family history of adverse reaction to anesthesia    pt's daughter has hx. of post-op nausea  . GERD (gastroesophageal reflux disease)   . History of breast cancer 2017   right  . History of chemotherapy    finished chemo 04/15/2016  . Personal history of chemotherapy   . Rash 08/04/2016   under right arm - states recent tick bite; to see MD 08/05/2016  . Runny nose 06/06/2016   clear drainage, per pt.   Past Surgical History:  Procedure Laterality Date  . AUGMENTATION MAMMAPLASTY    . BREAST ENHANCEMENT SURGERY Bilateral 2005  . BREAST RECONSTRUCTION WITH PLACEMENT OF TISSUE EXPANDER AND FLEX HD (ACELLULAR HYDRATED DERMIS) Right 05/19/2016   Procedure: RIGHT BREAST RECONSTRUCTION WITH PLACEMENT OF TISSUE EXPANDER AND  ALLODERM.;  Surgeon: Irene Limbo, MD;  Location: [facility];  Service: Plastics;  Laterality: Right;  .  COLONOSCOPY    . COLONOSCOPY WITH PROPOFOL  10/02/2014  . DEBRIDEMENT AND CLOSURE WOUND Right 06/08/2016   Procedure: DEBRIDEMENT RIGHT MASTECTOMY FLAP, TISSUE EXPANSION OF RIGHT CHEST;  Surgeon: Irene Limbo, MD;  Location: [facility];  Service: Plastics;  Laterality: Right;  . MASTECTOMY    . MASTOPEXY Left 08/11/2016   Procedure: LEFT BREAST MASTOPEXY;  Surgeon: Irene Limbo, MD;  Location: [facility];  Service: Plastics;  Laterality: Left;  . NIPPLE SPARING MASTECTOMY/SENTINAL LYMPH NODE BIOPSY/RECONSTRUCTION/PLACEMENT OF TISSUE EXPANDER Right 05/19/2016   Procedure: RIGHT NIPPLE SPARING MASTECTOMY WITH SENTINAL LYMPH NODE BIOPSY WITH BLUE DYE INJECTION;  Surgeon: Rolm Bookbinder, MD;  Location: [facility];  Service: General;  Laterality: Right;  . PORTACATH PLACEMENT N/A 12/30/2015   Procedure: INSERTION PORT-A-CATH WITH [place];  Surgeon: Rolm Bookbinder, MD;  Location: [facility];  Service: General;  Laterality: N/A;  . REDUCTION MAMMAPLASTY    . REMOVAL OF TISSUE EXPANDER AND PLACEMENT OF IMPLANT Right 08/11/2016   Procedure: REMOVAL OF RIGHT  TISSUE EXPANDER AND PLACEMENT OF IMPLANT;  Surgeon: Irene Limbo, MD;  Location: [facility];  Service: Plastics;  Laterality: Right;   Social History   Socioeconomic History  . Marital status: Married    Spouse name: Not on file  . Number of children: 2  . Years of education: Not on file  .  Highest education level: Not on file  Social Needs  . Financial resource strain: Not on file  . Food insecurity - worry: Not on file  . Food insecurity - inability: Not on file  . Transportation needs - medical: Not on file  . Transportation needs - non-medical: Not on file  Occupational History  . Occupation: medical receptionist  Tobacco Use  . Smoking status: Never Smoker  . Smokeless tobacco: Never Used  Substance and Sexual Activity  . Alcohol use: Yes    Comment: occasionally  .  Drug use: No  . Sexual activity: Not on file  Other Topics Concern  . Not on file  Social History Narrative   Married 1978 (Al). 2 girls 33 and 36 in 2018. [name] lives in [place], [place] with 40 yo son (single right now). and [name] in [place], [place]--> now [place] with husband and 1 year old daughter (born with heart defect with missing valve and initially not closed VSD--still weighs heavily on family- likely will have to be repaired).       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    Graduated from [employer]. [employer] prior.       Hobbies: gardening, decorating, sewing, enjoys creating   No Known Allergies Family History  Problem Relation Age of Onset  . Breast cancer Maternal Aunt        dx in her 17s  . Stroke Father        late 75s  . Hypertension Father   . Pulmonary fibrosis Mother   . Breast cancer Paternal Aunt 78  . Cancer Paternal Grandmother        abdominal; cervical vs uterine dx in her 27s  . Parkinson's disease Maternal Grandfather   . Anesthesia problems Daughter        post-op nausea     Past medical history, social, surgical and family history all reviewed in electronic medical record.  No pertanent information unless stated regarding to the chief complaint.   Review of Systems:Review of systems updated and as accurate as of 04/03/17  No headache, visual changes, nausea, vomiting, diarrhea, constipation, dizziness, abdominal pain, skin rash, fevers, chills, night sweats, weight loss, swollen lymph nodes, body aches, joint swelling, muscle aches, chest pain, shortness of breath, mood changes.   Objective  There were no vitals taken for this visit. Systems examined below as of 04/03/17   General: No apparent distress alert and oriented x3 mood and affect normal, dressed appropriately.  HEENT: Pupils equal, extraocular movements intact  Respiratory: Patient's speak in full  sentences and does not appear short of breath  Cardiovascular: No lower extremity edema, non tender, no erythema  Skin: Warm dry intact with no signs of infection or rash on extremities or on axial skeleton.  Abdomen: Soft nontender  Neuro: Cranial nerves II through XII are intact, neurovascularly intact in all extremities with 2+ DTRs and 2+ pulses.  Lymph: No lymphadenopathy of posterior or anterior cervical chain or axillae bilaterally.  Gait normal with good balance and coordination.  MSK:  Non tender with full range of motion and good stability and symmetric strength and tone of shoulders, elbows, wrist, hip, knee and ankles bilaterally.  Neck: Inspection mild loss of lordosis. No palpable stepoffs. Negative Spurling's maneuver. Mild limitation lacking last 5 degrees of side bending bilaterally and extension Grip strength and sensation normal in bilateral hands Strength good C4 to T1  distribution No sensory change to C4 to T1 Negative Hoffman sign bilaterally Reflexes normal Tightness in the right trapezius  Osteopathic findings C2 flexed rotated and side bent right C4 flexed rotated and side bent left C7 flexed rotated and side bent left T3 extended rotated and side bent right inhaled third rib T7 extended rotated and side bent left L4 flexed rotated and side bent right Sacrum right on right    Impression and Recommendations:     This case required medical decision making of moderate complexity.      Note: This dictation was prepared with Dragon dictation along with smaller phrase technology. Any transcriptional errors that result from this process are unintentional.

## 2017-04-05 ENCOUNTER — Encounter: Payer: Self-pay | Admitting: Family Medicine

## 2017-04-05 ENCOUNTER — Telehealth: Payer: Self-pay

## 2017-04-05 ENCOUNTER — Ambulatory Visit (INDEPENDENT_AMBULATORY_CARE_PROVIDER_SITE_OTHER): Payer: BLUE CROSS/BLUE SHIELD | Admitting: Family Medicine

## 2017-04-05 VITALS — BP 110/80 | HR 87 | Ht 63.0 in | Wt 144.0 lb

## 2017-04-05 DIAGNOSIS — M999 Biomechanical lesion, unspecified: Secondary | ICD-10-CM | POA: Diagnosis not present

## 2017-04-05 DIAGNOSIS — M5030 Other cervical disc degeneration, unspecified cervical region: Secondary | ICD-10-CM

## 2017-04-05 NOTE — Assessment & Plan Note (Signed)
Stable overall.  We discussed icing regimen and home exercises.  We discussed gabapentin.  We discussed once weekly vitamin D.  We discussed avoiding certain activities.  Follow-up with me again in 4-6 weeks.

## 2017-04-05 NOTE — Assessment & Plan Note (Signed)
Decision today to treat with OMT was based on Physical Exam  After verbal consent patient was treated with HVLA, ME, FPR techniques in cervical, thoracic, lumbar and sacral areas  Patient tolerated the procedure well with improvement in symptoms  Patient given exercises, stretches and lifestyle modifications  See medications in patient instructions if given  Patient will follow up in 4 weeks 

## 2017-04-05 NOTE — Patient Instructions (Signed)
4 weeks

## 2017-04-05 NOTE — Telephone Encounter (Signed)
Patients insurance will be verified for prolia, I will call patient to discuss benefits

## 2017-04-05 NOTE — Telephone Encounter (Signed)
-----   Message from Alphonsus Sias, [place] sent at 04/05/2017 10:35 AM EST ----- Regarding: Newman Nip, Dr. Tamala Julian wants to get this pt started on Prolia.  Thank you!!

## 2017-05-03 NOTE — Progress Notes (Signed)
[facility] [place], [place] [address] Phone: [phone] Subjective:    CC: Neck pain follow-up  IWP:Pat[facility]  [name] is a 62 y.o. female coming in with complaint of neck pain.  Have seen patient previously.  [name] is the gabapentin intermittently.  Nothing severe.  We discussed icing regimen and home exercises.  Patient states that she seems to be doing relatively well with the conservative therapy.     Past Medical History:  Diagnosis Date  . Anemia    due to chemo  . Anxiety   . Breast cancer ([place])   . Cardiomyopathy ([place])    mild, per cardiology note; due to Herceptin  . Dental crowns present   . Family history of adverse reaction to anesthesia    pt's daughter has hx. of post-op nausea  . GERD (gastroesophageal reflux disease)   . History of breast cancer 2017   right  . History of chemotherapy    finished chemo 04/15/2016  . Personal history of chemotherapy   . Rash 08/04/2016   under right arm - states recent tick bite; to see MD 08/05/2016  . Runny nose 06/06/2016   clear drainage, per pt.   Past Surgical History:  Procedure Laterality Date  . AUGMENTATION MAMMAPLASTY    . BREAST ENHANCEMENT SURGERY Bilateral 2005  . BREAST RECONSTRUCTION WITH PLACEMENT OF TISSUE EXPANDER AND FLEX HD (ACELLULAR HYDRATED DERMIS) Right 05/19/2016   Procedure: RIGHT BREAST RECONSTRUCTION WITH PLACEMENT OF TISSUE EXPANDER AND  ALLODERM.;  Surgeon: Irene Limbo, MD;  Location: [facility];  Service: Plastics;  Laterality: Right;  . COLONOSCOPY    . COLONOSCOPY WITH PROPOFOL  10/02/2014  . DEBRIDEMENT AND CLOSURE WOUND Right 06/08/2016   Procedure: DEBRIDEMENT RIGHT MASTECTOMY FLAP, TISSUE EXPANSION OF RIGHT CHEST;  Surgeon: Irene Limbo, MD;  Location: [facility];  Service: Plastics;  Laterality: Right;  . MASTECTOMY    . MASTOPEXY Left 08/11/2016   Procedure: LEFT BREAST MASTOPEXY;  Surgeon:  Irene Limbo, MD;  Location: [facility];  Service: Plastics;  Laterality: Left;  . NIPPLE SPARING MASTECTOMY/SENTINAL LYMPH NODE BIOPSY/RECONSTRUCTION/PLACEMENT OF TISSUE EXPANDER Right 05/19/2016   Procedure: RIGHT NIPPLE SPARING MASTECTOMY WITH SENTINAL LYMPH NODE BIOPSY WITH BLUE DYE INJECTION;  Surgeon: Rolm Bookbinder, MD;  Location: [facility];  Service: General;  Laterality: Right;  . PORTACATH PLACEMENT N/A 12/30/2015   Procedure: INSERTION PORT-A-CATH WITH [place];  Surgeon: Rolm Bookbinder, MD;  Location: [facility];  Service: General;  Laterality: N/A;  . REDUCTION MAMMAPLASTY    . REMOVAL OF TISSUE EXPANDER AND PLACEMENT OF IMPLANT Right 08/11/2016   Procedure: REMOVAL OF RIGHT  TISSUE EXPANDER AND PLACEMENT OF IMPLANT;  Surgeon: Irene Limbo, MD;  Location: [facility];  Service: Plastics;  Laterality: Right;   Social History   Socioeconomic History  . Marital status: Married    Spouse name: None  . Number of children: 2  . Years of education: None  . Highest education level: None  Social Needs  . Financial resource strain: None  . Food insecurity - worry: None  . Food insecurity - inability: None  . Transportation needs - medical: None  . Transportation needs - non-medical: None  Occupational History  . Occupation: medical receptionist  Tobacco Use  . Smoking status: Never Smoker  . Smokeless tobacco: Never Used  Substance and Sexual Activity  . Alcohol use: Yes  Comment: occasionally  . Drug use: No  . Sexual activity: None  Other Topics Concern  . None  Social History Narrative   Married 1978 (Al). 2 girls 33 and 36 in 2018. [name] lives in [place], [place] with 71 yo son (single right now). and [name] in [place], [place]--> now [place] with husband and 66 year old daughter (born with heart defect with missing valve and initially not closed VSD--still weighs heavily on family- likely will have  to be repaired).       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    Graduated from [place]. [employer] prior.       Hobbies: gardening, decorating, sewing, enjoys creating   No Known Allergies Family History  Problem Relation Age of Onset  . Breast cancer Maternal Aunt        dx in her 21s  . Stroke Father        late 21s  . Hypertension Father   . Pulmonary fibrosis Mother   . Breast cancer Paternal Aunt 78  . Cancer Paternal Grandmother        abdominal; cervical vs uterine dx in her 68s  . Parkinson's disease Maternal Grandfather   . Anesthesia problems Daughter        post-op nausea     Past medical history, social, surgical and family history all reviewed in electronic medical record.  No pertanent information unless stated regarding to the chief complaint.   Review of Systems:Review of systems updated and as accurate as of 05/04/17  No visual changes, nausea, vomiting, diarrhea, constipation, dizziness, abdominal pain, skin rash, fevers, chills, night sweats, weight loss, swollen lymph nodes, body aches, joint swelling, chest pain, shortness of breath, mood changes.  Positive headaches and muscle aches  Objective  Blood pressure 110/70, pulse 85, height 5\' 3"  (1.6 m), weight 148 lb (67.1 kg), SpO2 98 %. Systems examined below as of 05/04/17   General: No apparent distress alert and oriented x3 mood and affect normal, dressed appropriately.  HEENT: Pupils equal, extraocular movements intact  Respiratory: Patient's speak in full sentences and does not appear short of breath  Cardiovascular: No lower extremity edema, non tender, no erythema  Skin: Warm dry intact with no signs of infection or rash on extremities or on axial skeleton.  Abdomen: Soft nontender  Neuro: Cranial nerves II through XII are intact, neurovascularly intact in all extremities with 2+ DTRs and 2+ pulses.  Lymph: No lymphadenopathy of  posterior or anterior cervical chain or axillae bilaterally.  Gait normal with good balance and coordination.  MSK:  Non tender with full range of motion and good stability and symmetric strength and tone of shoulders, elbows, wrist, hip, knee and ankles bilaterally.  Neck: Inspection mild loss of lordosis mild increase in kyphosis of the upper back. No palpable stepoffs. Negative Spurling's maneuver. Mild limitation to right-sided side bending. Grip strength and sensation normal in bilateral hands Strength good C4 to T1 distribution No sensory change to C4 to T1 Negative Hoffman sign bilaterally Reflexes normal Significant tightness of the trapezius muscles bilaterally Mild discomfort over the left greater trochanteric area with mild positive [employer] findings C2 flexed rotated and side bent right C7 flexed rotated and side bent left T3 extended rotated and side bent right inhaled third rib T5 extended rotated and side bent left L1 flexed rotated and side bent right Sacrum right on right  Impression and Recommendations:     This case required medical decision making of moderate complexity.      Note: This dictation was prepared with Dragon dictation along with smaller phrase technology. Any transcriptional errors that result from this process are unintentional.

## 2017-05-04 ENCOUNTER — Ambulatory Visit: Payer: BLUE CROSS/BLUE SHIELD | Admitting: Family Medicine

## 2017-05-04 ENCOUNTER — Encounter: Payer: Self-pay | Admitting: Family Medicine

## 2017-05-04 VITALS — BP 110/70 | HR 85 | Ht 63.0 in | Wt 148.0 lb

## 2017-05-04 DIAGNOSIS — M999 Biomechanical lesion, unspecified: Secondary | ICD-10-CM | POA: Diagnosis not present

## 2017-05-04 DIAGNOSIS — M5030 Other cervical disc degeneration, unspecified cervical region: Secondary | ICD-10-CM | POA: Diagnosis not present

## 2017-05-04 NOTE — Assessment & Plan Note (Signed)
Decision today to treat with OMT was based on Physical Exam  After verbal consent patient was treated with HVLA, ME, FPR techniques in cervical, thoracic, rib, lumbar and sacral areas  Patient tolerated the procedure well with improvement in symptoms  Patient given exercises, stretches and lifestyle modifications  See medications in patient instructions if given  Patient will follow up in 4-6 weeks 

## 2017-05-04 NOTE — Assessment & Plan Note (Signed)
Stable overall.  We discussed icing regimen and home exercises, we discussed which activities of doing which wants to avoid.  Patient is to increase activity slowly over the course the next several days.  Patient will follow up with me again 4-6 weeks.

## 2017-05-04 NOTE — Patient Instructions (Signed)
Good to see you  Patricia Chandler is your friend.  Stay active Keep working on the mattress I can inject you as well  DHEA 50 mg daily for 4 weeks See me again in 4-6 weeks

## 2017-05-05 ENCOUNTER — Other Ambulatory Visit: Payer: Self-pay | Admitting: Family Medicine

## 2017-05-05 MED FILL — MELOXICAM 7.5 MG TABLET: 7.5 | 90 days supply | Qty: 90 | Fill #0

## 2017-05-05 NOTE — Telephone Encounter (Signed)
Refill done.  

## 2017-05-17 ENCOUNTER — Telehealth: Payer: Self-pay

## 2017-05-17 NOTE — Telephone Encounter (Signed)
Copied from [facility]. Topic: Inquiry >> May 17, 2017 10:03 AM Pricilla Handler wrote: Reason for CRM: Anderson Malta from [facility] [phone]) called stating that they received a request for Prolia for this patient, but they must have a copy of a DEXA Scan for this patient. Anderson Malta wants the DEXA Scan faxed to [phone]. Anderson Malta states that the sooner the scan is faxed, the better. Patient cannot receive the Prolia without the DEXA Scan.       Thank You!!!  Faxed DEXA to [facility]. I have no record of her receiving Prolia from our office though. I am not sure who submitted her for Prolia?

## 2017-06-04 ENCOUNTER — Encounter: Payer: Self-pay | Admitting: Family Medicine

## 2017-06-07 MED FILL — ZOLPIDEM TARTRATE 5 MG TABL: 5 | 30 days supply | Qty: 30 | Fill #1

## 2017-06-07 NOTE — Progress Notes (Signed)
[facility] [place], [place] [address] Phone: [phone] Subjective:    I'm seeing this patient by the request  of:    CC: Neck pain follow-up  [id number]  Patricia Chandler is a 62 y.o. female coming in with complaint of neck pain. Her right shoulder continues to bother her on a constant basis. She did try meloxicam which has not done much to alleviate her pain.  Patient feels that it is more localized around the right shoulder than previously.  States that it is waking her up at night.  Rates the severity of pain is 7 out of 10.     Past Medical History:  Diagnosis Date  . Anemia    due to chemo  . Anxiety   . Breast cancer ([place])   . Cardiomyopathy ([facility])    mild, per cardiology note; due to Herceptin  . Dental crowns present   . Family history of adverse reaction to anesthesia    pt's daughter has hx. of post-op nausea  . GERD (gastroesophageal reflux disease)   . History of breast cancer 2017   right  . History of chemotherapy    finished chemo 04/15/2016  . Personal history of chemotherapy   . Rash 08/04/2016   under right arm - states recent tick bite; to see MD 08/05/2016  . Runny nose 06/06/2016   clear drainage, per pt.   Past Surgical History:  Procedure Laterality Date  . AUGMENTATION MAMMAPLASTY    . BREAST ENHANCEMENT SURGERY Bilateral 2005  . BREAST RECONSTRUCTION WITH PLACEMENT OF TISSUE EXPANDER AND FLEX HD (ACELLULAR HYDRATED DERMIS) Right 05/19/2016   Procedure: RIGHT BREAST RECONSTRUCTION WITH PLACEMENT OF TISSUE EXPANDER AND  ALLODERM.;  Surgeon: Irene Limbo, MD;  Location: [facility];  Service: Plastics;  Laterality: Right;  . COLONOSCOPY    . COLONOSCOPY WITH PROPOFOL  10/02/2014  . DEBRIDEMENT AND CLOSURE WOUND Right 06/08/2016   Procedure: DEBRIDEMENT RIGHT MASTECTOMY FLAP, TISSUE EXPANSION OF RIGHT CHEST;  Surgeon: Irene Limbo, MD;  Location: [facility];  Service: Plastics;   Laterality: Right;  . MASTECTOMY    . MASTOPEXY Left 08/11/2016   Procedure: LEFT BREAST MASTOPEXY;  Surgeon: Irene Limbo, MD;  Location: [facility];  Service: Plastics;  Laterality: Left;  . NIPPLE SPARING MASTECTOMY/SENTINAL LYMPH NODE BIOPSY/RECONSTRUCTION/PLACEMENT OF TISSUE EXPANDER Right 05/19/2016   Procedure: RIGHT NIPPLE SPARING MASTECTOMY WITH SENTINAL LYMPH NODE BIOPSY WITH BLUE DYE INJECTION;  Surgeon: Rolm Bookbinder, MD;  Location: [facility];  Service: General;  Laterality: Right;  . PORTACATH PLACEMENT N/A 12/30/2015   Procedure: INSERTION PORT-A-CATH WITH [place];  Surgeon: Rolm Bookbinder, MD;  Location: [facility];  Service: General;  Laterality: N/A;  . REDUCTION MAMMAPLASTY    . REMOVAL OF TISSUE EXPANDER AND PLACEMENT OF IMPLANT Right 08/11/2016   Procedure: REMOVAL OF RIGHT  TISSUE EXPANDER AND PLACEMENT OF IMPLANT;  Surgeon: Irene Limbo, MD;  Location: [facility];  Service: Plastics;  Laterality: Right;   Social History   Socioeconomic History  . Marital status: Married    Spouse name: None  . Number of children: 2  . Years of education: None  . Highest education level: None  Social Needs  . Financial resource strain: None  . Food insecurity - worry: None  . Food insecurity - inability: None  . Transportation needs - medical: None  . Transportation needs - non-medical: None  Occupational History  . Occupation:  medical receptionist  Tobacco Use  . Smoking status: Never Smoker  . Smokeless tobacco: Never Used  Substance and Sexual Activity  . Alcohol use: Yes    Comment: occasionally  . Drug use: No  . Sexual activity: None  Other Topics Concern  . None  Social History Narrative   Married 1978 (Patricia Chandler). 2 girls 33 and 36 in 2018. Patricia Chandler (liz) lives in [place], [place] with 3 yo son (single right now). and Patricia Chandler in [place], [place]--> now [place] with husband and 76 year old daughter (born  with heart defect with missing valve and initially not closed VSD--still weighs heavily on family- likely will have to be repaired).       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 Had been doing event design on hold.    Graduated from [employer]. [employer] prior.       Hobbies: gardening, decorating, sewing, enjoys creating   No Known Allergies Family History  Problem Relation Age of Onset  . Breast cancer Maternal Aunt        dx in her 45s  . Stroke Father        late 52s  . Hypertension Father   . Pulmonary fibrosis Mother   . Breast cancer Paternal Aunt 78  . Cancer Paternal Grandmother        abdominal; cervical vs uterine dx in her 39s  . Parkinson's disease Maternal Grandfather   . Anesthesia problems Daughter        post-op nausea     Past medical history, social, surgical and family history all reviewed in electronic medical record.  No pertanent information unless stated regarding to the chief complaint.   Review of Systems:Review of systems updated and as accurate as of 06/08/17  No headache, visual changes, nausea, vomiting, diarrhea, constipation, dizziness, abdominal pain, skin rash, fevers, chills, night sweats, weight loss, swollen lymph nodes, body aches, joint swelling, muscle aches, chest pain, shortness of breath, mood changes.   Objective  Blood pressure 108/80, pulse 92, height 5' 3.5" (1.613 m), weight 149 lb (67.6 kg), SpO2 98 %. Systems examined below as of 06/08/17   General: No apparent distress alert and oriented x3 mood and affect normal, dressed appropriately.  HEENT: Pupils equal, extraocular movements intact  Respiratory: Patient's speak in full sentences and does not appear short of breath  Cardiovascular: No lower extremity edema, non tender, no erythema  Skin: Warm dry intact with no signs of infection or rash on extremities or on axial skeleton.  Abdomen: Soft nontender  Neuro: Cranial nerves II through XII are  intact, neurovascularly intact in all extremities with 2+ DTRs and 2+ pulses.  Lymph: No lymphadenopathy of posterior or anterior cervical chain or axillae bilaterally.  Gait normal with good balance and coordination.  MSK:  Non tender with full range of motion and good stability and symmetric strength and tone of shoulders, elbows, wrist, hip, knee and ankles bilaterally.  Neck: Inspection mild loss of lordosis. No palpable stepoffs. Negative Spurling's maneuver. Full neck range of motion Grip strength and sensation normal in bilateral hands Strength good C4 to T1 distribution No sensory change to C4 to T1 Negative Hoffman sign bilaterally Reflexes normal Tightness of the right trapezius  Procedure: Real-time Ultrasound Guided Injection of right glenohumeral joint Device: GE Logiq Q7  Ultrasound guided injection is preferred based studies that show increased duration, increased effect, greater accuracy, decreased procedural pain, increased response rate with ultrasound guided versus blind  injection.  Verbal informed consent obtained.  Time-out conducted.  Noted no overlying erythema, induration, or other signs of local infection.  Skin prepped in a sterile fashion.  Local anesthesia: Topical Ethyl chloride.  With sterile technique and under real time ultrasound guidance:  Joint visualized.  23g 1  inch needle inserted posterior approach. Pictures taken for needle placement. Patient did have injection of 2 cc of 1% lidocaine, 2 cc of 0.5% Marcaine, and 1.0 cc of Kenalog 40 mg/dL. Completed without difficulty  Pain immediately resolved suggesting accurate placement of the medication.  Advised to call if fevers/chills, erythema, induration, drainage, or persistent bleeding.  Images permanently stored and available for review in the ultrasound unit.  Impression: Technically successful ultrasound guided injection.  Osteopathic findings C2 flexed rotated and side bent right T2 extended  rotated and side bent right inhaled rib T6 extended rotated and side bent left L2 flexed rotated and side bent right Sacrum right on right     Impression and Recommendations:     This case required medical decision making of moderate complexity.      Note: This dictation was prepared with Dragon dictation along with smaller phrase technology. Any transcriptional errors that result from this process are unintentional.

## 2017-06-08 ENCOUNTER — Encounter: Payer: Self-pay | Admitting: Family Medicine

## 2017-06-08 ENCOUNTER — Ambulatory Visit: Payer: Self-pay

## 2017-06-08 ENCOUNTER — Ambulatory Visit: Payer: BLUE CROSS/BLUE SHIELD | Admitting: Family Medicine

## 2017-06-08 VITALS — BP 108/80 | HR 92 | Ht 63.5 in | Wt 149.0 lb

## 2017-06-08 DIAGNOSIS — M5030 Other cervical disc degeneration, unspecified cervical region: Secondary | ICD-10-CM | POA: Diagnosis not present

## 2017-06-08 DIAGNOSIS — G8929 Other chronic pain: Secondary | ICD-10-CM

## 2017-06-08 DIAGNOSIS — M25511 Pain in right shoulder: Principal | ICD-10-CM

## 2017-06-08 DIAGNOSIS — M999 Biomechanical lesion, unspecified: Secondary | ICD-10-CM | POA: Diagnosis not present

## 2017-06-08 DIAGNOSIS — M7551 Bursitis of right shoulder: Secondary | ICD-10-CM | POA: Diagnosis not present

## 2017-06-08 MED ORDER — ZOSTER VAC RECOMB ADJUVANTED 50 MCG/0.5ML IM SUSR: 0.5000 mL | Freq: Once | INTRAMUSCULAR | 0 refills | Status: AC

## 2017-06-08 NOTE — Assessment & Plan Note (Signed)
Decision today to treat with OMT was based on Physical Exam  After verbal consent patient was treated with HVLA, ME, FPR techniques in cervical, thoracic, lumbar and sacral areas  Patient tolerated the procedure well with improvement in symptoms  Patient given exercises, stretches and lifestyle modifications  See medications in patient instructions if given  Patient will follow up in 4 weeks 

## 2017-06-08 NOTE — Assessment & Plan Note (Signed)
Right shoulder bursitis.  Discussed icing regimen.  Discussed topical anti-inflammatories and icing regimen.  Patient will continue all medications.  Hopefully the injection helped.  Continue osteopathic manipulation for the degenerative disc disease.  Possible advanced imaging may be necessary if continues for possible epidural injections.

## 2017-06-08 NOTE — Patient Instructions (Addendum)
Good to see you  Ice is your friend Injected the shoulder today may need the [facility] joint as well.  Keep hands within peripheral vision  Stay active  OK to do machines but 50% weight and increase only 5% a week.  3 sets of 12  See me again in 4-8 weeks

## 2017-06-09 MED FILL — SHINGRIX 50 MCG SUS: 50 | 1 days supply | Qty: 1 | Fill #0

## 2017-06-16 ENCOUNTER — Ambulatory Visit (INDEPENDENT_AMBULATORY_CARE_PROVIDER_SITE_OTHER): Payer: BLUE CROSS/BLUE SHIELD

## 2017-06-16 DIAGNOSIS — M810 Age-related osteoporosis without current pathological fracture: Secondary | ICD-10-CM | POA: Diagnosis not present

## 2017-06-16 MED ORDER — DENOSUMAB 60 MG/ML ~~LOC~~ SOLN
60.0000 mg | Freq: Once | SUBCUTANEOUS | Status: AC
  Administered 2017-06-16: 60 mg via SUBCUTANEOUS

## 2017-06-21 ENCOUNTER — Other Ambulatory Visit: Payer: Self-pay | Admitting: Hematology and Oncology

## 2017-06-21 ENCOUNTER — Telehealth: Payer: Self-pay

## 2017-06-21 MED FILL — LETROZOLE 2.5 MG TABLET: 2.5 | 90 days supply | Qty: 90 | Fill #0

## 2017-06-21 NOTE — Telephone Encounter (Signed)
Letrozole script refilled.

## 2017-06-29 ENCOUNTER — Encounter: Payer: Self-pay | Admitting: Family Medicine

## 2017-06-29 ENCOUNTER — Ambulatory Visit (INDEPENDENT_AMBULATORY_CARE_PROVIDER_SITE_OTHER): Payer: BLUE CROSS/BLUE SHIELD | Admitting: Family Medicine

## 2017-06-29 VITALS — BP 110/80 | HR 103 | Temp 98.7°F | Ht 63.0 in | Wt 146.0 lb

## 2017-06-29 DIAGNOSIS — J069 Acute upper respiratory infection, unspecified: Secondary | ICD-10-CM | POA: Diagnosis not present

## 2017-06-29 NOTE — Progress Notes (Signed)
Patricia Chandler is a 62 y.o. female here for an acute visit.  History of Present Illness:   Cough  This is a new problem. The current episode started in the past 7 days. The problem has been gradually improving. The cough is non-productive. Associated symptoms include nasal congestion, postnasal drip, rhinorrhea and a sore throat. Pertinent negatives include no chest pain, ear pain, fever, rash, shortness of breath or wheezing. Nothing aggravates the symptoms. She has tried rest and OTC cough suppressant for the symptoms. The treatment provided moderate relief.   PMHx, SurgHx, SocialHx, Medications, and Allergies were reviewed in the Visit Navigator and updated as appropriate.  Current Medications:   Current Outpatient Medications:  .  letrozole (FEMARA) 2.5 MG tablet, Take 2.5 mg by mouth daily., Disp: , Rfl:  .  letrozole (FEMARA) 2.5 MG tablet, TAKE 1 TABLET BY MOUTH ONCE DAILY, Disp: 90 tablet, Rfl: 3 .  meloxicam (MOBIC) 7.5 MG tablet, TAKE 1 TABLET BY MOUTH ONCE DAILY, Disp: 90 tablet, Rfl: 0 .  zolpidem (AMBIEN) 5 MG tablet, TAKE 1 TABLET AT BEDTIME AS NEEDED FOR SLEEP, Disp: 30 tablet, Rfl: 1 .  alendronate (FOSAMAX) 70 MG tablet, Take 1 tablet (70 mg total) by mouth once a week. Take with a full glass of water on an empty stomach. (Patient not taking: Reported on 06/29/2017), Disp: 12 tablet, Rfl: 3 .  Vitamin D, Ergocalciferol, (DRISDOL) 50000 units CAPS capsule, Take 1 capsule (50,000 Units total) by mouth every 7 (seven) days. (Patient not taking: Reported on 06/29/2017), Disp: 12 capsule, Rfl: 0 .  zolpidem (AMBIEN) 5 MG tablet, Take 1.5 tablets by mouth at bedtime., Disp: , Rfl:    No Known Allergies   Review of Systems:   Pertinent items are noted in the HPI. Otherwise, ROS is negative.  Vitals:   Vitals:   06/29/17 1312  BP: 110/80  Pulse: (!) 103  Temp: 98.7 F (37.1 C)  TempSrc: Oral  SpO2: 96%  Weight: 146 lb (66.2 kg)  Height: 5\' 3"  (1.6 m)     Body  mass index is 25.86 kg/m.  Physical Exam:   Physical Exam  Constitutional: She is oriented to person, place, and time. She appears well-developed and well-nourished. No distress.  HENT:  Head: Normocephalic and atraumatic.  Right Ear: External ear normal.  Left Ear: External ear normal.  Nose: Mucosal edema and rhinorrhea present.  Mouth/Throat: Posterior oropharyngeal erythema present.  Eyes: Conjunctivae and EOM are normal. Pupils are equal, round, and reactive to light.  Neck: Normal range of motion. Neck supple. No thyromegaly present.  Cardiovascular: Normal rate, regular rhythm, normal heart sounds and intact distal pulses.  Pulmonary/Chest: Effort normal and breath sounds normal.  Abdominal: Soft. Bowel sounds are normal.  Musculoskeletal: Normal range of motion.  Lymphadenopathy:    She has no cervical adenopathy.  Neurological: She is alert and oriented to person, place, and time.  Skin: Skin is warm and dry. Capillary refill takes less than 2 seconds.  Psychiatric: She has a normal mood and affect. Her behavior is normal.  Nursing note and vitals reviewed.   Assessment and Plan:   1. Viral URI  . Reviewed expectations re: course of current medical issues. . Discussed self-management of symptoms. . Outlined signs and symptoms indicating need for more acute intervention. . Patient verbalized understanding and all questions were answered. Marland Kitchen Health Maintenance issues including appropriate healthy diet, exercise, and smoking avoidance were discussed with patient. . See orders for this visit  as documented in the electronic medical record. . Patient received an After Visit Summary.  Briscoe Deutscher, DO [place], [facility] 06/30/2017

## 2017-06-30 ENCOUNTER — Encounter: Payer: Self-pay | Admitting: Family Medicine

## 2017-07-19 NOTE — Progress Notes (Signed)
[facility] [address] [place], [place] [address] Phone: [phone] Subjective:    I'm seeing this patient by the request  of:    CC: neck pain follow up   BHA:[id number]  Patricia Chandler is a 62 y.o. female coming in with complaint of right shoulder pain. She said that her shoulder is feeling good after the cortisone injection.  Was found to have more of a bursitis.  Doing significantly better at this time.  Neck pain is stable.  Responded very well to osteopathic manipulation previously.  Nothing severe.  Feels like her jaw pain is improving as well      Past Medical History:  Diagnosis Date  . Anemia    due to chemo  . Anxiety   . Breast cancer ([place])   . Cardiomyopathy ([place])    mild, per cardiology note; due to Herceptin  . Dental crowns present   . Family history of adverse reaction to anesthesia    pt's daughter has hx. of post-op nausea  . GERD (gastroesophageal reflux disease)   . History of breast cancer 2017   right  . History of chemotherapy    finished chemo 04/15/2016  . Personal history of chemotherapy   . Rash 08/04/2016   under right arm - states recent tick bite; to see MD 08/05/2016  . Runny nose 06/06/2016   clear drainage, per pt.   Past Surgical History:  Procedure Laterality Date  . AUGMENTATION MAMMAPLASTY    . BREAST ENHANCEMENT SURGERY Bilateral 2005  . BREAST RECONSTRUCTION WITH PLACEMENT OF TISSUE EXPANDER AND FLEX HD (ACELLULAR HYDRATED DERMIS) Right 05/19/2016   Procedure: RIGHT BREAST RECONSTRUCTION WITH PLACEMENT OF TISSUE EXPANDER AND  ALLODERM.;  Surgeon: Irene Limbo, MD;  Location: [facility];  Service: Plastics;  Laterality: Right;  . COLONOSCOPY    . COLONOSCOPY WITH PROPOFOL  10/02/2014  . DEBRIDEMENT AND CLOSURE WOUND Right 06/08/2016   Procedure: DEBRIDEMENT RIGHT MASTECTOMY FLAP, TISSUE EXPANSION OF RIGHT CHEST;  Surgeon: Irene Limbo, MD;  Location: [facility];  Service:  Plastics;  Laterality: Right;  . MASTECTOMY    . MASTOPEXY Left 08/11/2016   Procedure: LEFT BREAST MASTOPEXY;  Surgeon: Irene Limbo, MD;  Location: [facility];  Service: Plastics;  Laterality: Left;  . NIPPLE SPARING MASTECTOMY/SENTINAL LYMPH NODE BIOPSY/RECONSTRUCTION/PLACEMENT OF TISSUE EXPANDER Right 05/19/2016   Procedure: RIGHT NIPPLE SPARING MASTECTOMY WITH SENTINAL LYMPH NODE BIOPSY WITH BLUE DYE INJECTION;  Surgeon: Rolm Bookbinder, MD;  Location: [facility];  Service: General;  Laterality: Right;  . PORTACATH PLACEMENT N/A 12/30/2015   Procedure: INSERTION PORT-A-CATH WITH [place];  Surgeon: Rolm Bookbinder, MD;  Location: [facility];  Service: General;  Laterality: N/A;  . REDUCTION MAMMAPLASTY    . REMOVAL OF TISSUE EXPANDER AND PLACEMENT OF IMPLANT Right 08/11/2016   Procedure: REMOVAL OF RIGHT  TISSUE EXPANDER AND PLACEMENT OF IMPLANT;  Surgeon: Irene Limbo, MD;  Location: [facility];  Service: Plastics;  Laterality: Right;   Social History   Socioeconomic History  . Marital status: Married    Spouse name: Not on file  . Number of children: 2  . Years of education: Not on file  . Highest education level: Not on file  Occupational History  . Occupation: medical receptionist  Social Needs  . Financial resource strain: Not on file  . Food insecurity:    Worry: Not on file    Inability: Not on file  .  Transportation needs:    Medical: Not on file    Non-medical: Not on file  Tobacco Use  . Smoking status: Never Smoker  . Smokeless tobacco: Never Used  Substance and Sexual Activity  . Alcohol use: Yes    Comment: occasionally  . Drug use: No  . Sexual activity: Not on file  Lifestyle  . Physical activity:    Days per week: Not on file    Minutes per session: Not on file  . Stress: Not on file  Relationships  . Social connections:    Talks on phone: Not on file    Gets together: Not on file     Attends religious service: Not on file    Active member of club or organization: Not on file    Attends meetings of clubs or organizations: Not on file    Relationship status: Not on file  Other Topics Concern  . Not on file  Social History Narrative   Married 1978 (Al). 2 girls 33 and 36 in 2018. [name] lives in [place], [place] with 25 yo son (single right now). and [name] in [place], [place]--> now [place] with husband and 36 year old daughter (born with heart defect with missing valve and initially not closed VSD--still weighs heavily on family- likely will have to be repaired).       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    Graduated from [place]. [employer] prior.       Hobbies: gardening, decorating, sewing, enjoys creating   No Known Allergies Family History  Problem Relation Age of Onset  . Breast cancer Maternal Aunt        dx in her 25s  . Stroke Father        late 34s  . Hypertension Father   . Pulmonary fibrosis Mother   . Breast cancer Paternal Aunt 78  . Cancer Paternal Grandmother        abdominal; cervical vs uterine dx in her 35s  . Parkinson's disease Maternal Grandfather   . Anesthesia problems Daughter        post-op nausea     Past medical history, social, surgical and family history all reviewed in electronic medical record.  No pertanent information unless stated regarding to the chief complaint.   Review of Systems:Review of systems updated and as accurate as of 07/20/17  No headache, visual changes, nausea, vomiting, diarrhea, constipation, dizziness, abdominal pain, skin rash, fevers, chills, night sweats, weight loss, swollen lymph nodes, body aches, joint swelling, chest pain, shortness of breath, mood changes.  Mild positive muscle aches  Objective  Blood pressure 108/80, pulse 93, height 5\' 3"  (1.6 m), weight 146 lb (66.2 kg), SpO2 98 %. Systems examined below as of  07/20/17   General: No apparent distress alert and oriented x3 mood and affect normal, dressed appropriately.  HEENT: Pupils equal, extraocular movements intact  Respiratory: Patient's speak in full sentences and does not appear short of breath  Cardiovascular: No lower extremity edema, non tender, no erythema  Skin: Warm dry intact with no signs of infection or rash on extremities or on axial skeleton.  Abdomen: Soft nontender  Neuro: Cranial nerves II through XII are intact, neurovascularly intact in all extremities with 2+ DTRs and 2+ pulses.  Lymph: No lymphadenopathy of posterior or anterior cervical chain or axillae bilaterally.  Gait normal with good balance and coordination.  MSK:  Non  tender with full range of motion and good stability and symmetric strength and tone of shoulders, elbows, wrist, hip, knee and ankles bilaterally.  Neck: Inspection mild loss of lordosis. No palpable stepoffs. Negative Spurling's maneuver. Full neck range of motion Grip strength and sensation normal in bilateral hands Strength good C4 to T1 distribution No sensory change to C4 to T1 Negative Hoffman sign bilaterally Reflexes normal Mild tightness of the trapezius bilaterally  Osteopathic findings C2 flexed rotated and side bent right C4 flexed rotated and side bent left C6 flexed rotated and side bent right T5 extended rotated and side bent left L2 flexed rotated and side bent left Sacrum right on right     Impression and Recommendations:     This case required medical decision making of moderate complexity.      Note: This dictation was prepared with Dragon dictation along with smaller phrase technology. Any transcriptional errors that result from this process are unintentional.

## 2017-07-20 ENCOUNTER — Encounter: Payer: Self-pay | Admitting: Family Medicine

## 2017-07-20 ENCOUNTER — Telehealth: Payer: Self-pay

## 2017-07-20 ENCOUNTER — Ambulatory Visit: Payer: BLUE CROSS/BLUE SHIELD | Admitting: Family Medicine

## 2017-07-20 VITALS — BP 108/80 | HR 93 | Ht 63.0 in | Wt 146.0 lb

## 2017-07-20 DIAGNOSIS — M999 Biomechanical lesion, unspecified: Secondary | ICD-10-CM | POA: Diagnosis not present

## 2017-07-20 DIAGNOSIS — M7551 Bursitis of right shoulder: Secondary | ICD-10-CM | POA: Diagnosis not present

## 2017-07-20 DIAGNOSIS — M5030 Other cervical disc degeneration, unspecified cervical region: Secondary | ICD-10-CM | POA: Diagnosis not present

## 2017-07-20 NOTE — Patient Instructions (Signed)
You are amazing  Barely needed much  See me again 6-8 weeks

## 2017-07-20 NOTE — Assessment & Plan Note (Signed)
Decision today to treat with OMT was based on Physical Exam  After verbal consent patient was treated with HVLA, ME, FPR techniques in cervical, thoracic, lumbar and sacral areas  Patient tolerated the procedure well with improvement in symptoms  Patient given exercises, stretches and lifestyle modifications  See medications in patient instructions if given  Patient will follow up in 6-8 weeks 

## 2017-07-20 NOTE — Assessment & Plan Note (Signed)
Stable.  Continue to monitor.  Continue to work on posture.  Patient is increasing her activity.  Follow-up with me again in 6-8 weeks

## 2017-07-20 NOTE — Assessment & Plan Note (Signed)
Significant improvement after injection.  No change in management

## 2017-07-20 NOTE — Telephone Encounter (Signed)
Left message asking patient to call back to schedule nurse visit for first shingrix vaccine---per dr smith/sports med,----let tamara know if patient calls back to schedule so that both vaccines can be labeled---any questions, can talk with tamara,RN at [facility] office

## 2017-08-04 ENCOUNTER — Other Ambulatory Visit: Payer: Self-pay | Admitting: Hematology and Oncology

## 2017-08-05 MED FILL — ZOLPIDEM TARTRATE 5 MG TABL: 5 | 30 days supply | Qty: 30 | Fill #0

## 2017-08-09 MED FILL — SHINGRIX 50 MCG SUS: 50 | 1 days supply | Qty: 1 | Fill #1

## 2017-08-10 ENCOUNTER — Encounter: Payer: Self-pay | Admitting: Family Medicine

## 2017-09-12 NOTE — Progress Notes (Deleted)
[name] [address] [place], [place] [address] Phone: [phone] Subjective:    I'm seeing this patient by the request  of:    CC:   IOX:[id number]  Patricia Chandler is a 61 y.o. female coming in with complaint of ***  Onset-  Location Duration-  Character- Aggravating factors- Reliving factors-  Therapies tried-  Severity-     Past Medical History:  Diagnosis Date  . Anemia    due to chemo  . Anxiety   . Breast cancer ([place])   . Cardiomyopathy ([place])    mild, per cardiology note; due to Herceptin  . Dental crowns present   . Family history of adverse reaction to anesthesia    pt's daughter has hx. of post-op nausea  . GERD (gastroesophageal reflux disease)   . History of breast cancer 2017   right  . History of chemotherapy    finished chemo 04/15/2016  . Personal history of chemotherapy   . Rash 08/04/2016   under right arm - states recent tick bite; to see MD 08/05/2016  . Runny nose 06/06/2016   clear drainage, per pt.   Past Surgical History:  Procedure Laterality Date  . AUGMENTATION MAMMAPLASTY    . BREAST ENHANCEMENT SURGERY Bilateral 2005  . BREAST RECONSTRUCTION WITH PLACEMENT OF TISSUE EXPANDER AND FLEX HD (ACELLULAR HYDRATED DERMIS) Right 05/19/2016   Procedure: RIGHT BREAST RECONSTRUCTION WITH PLACEMENT OF TISSUE EXPANDER AND  ALLODERM.;  Surgeon: Irene Limbo, MD;  Location: [facility];  Service: Plastics;  Laterality: Right;  . COLONOSCOPY    . COLONOSCOPY WITH PROPOFOL  10/02/2014  . DEBRIDEMENT AND CLOSURE WOUND Right 06/08/2016   Procedure: DEBRIDEMENT RIGHT MASTECTOMY FLAP, TISSUE EXPANSION OF RIGHT CHEST;  Surgeon: Irene Limbo, MD;  Location: [facility];  Service: Plastics;  Laterality: Right;  . MASTECTOMY    . MASTOPEXY Left 08/11/2016   Procedure: LEFT BREAST MASTOPEXY;  Surgeon: Irene Limbo, MD;  Location: [facility];  Service: Plastics;  Laterality: Left;  .  NIPPLE SPARING MASTECTOMY/SENTINAL LYMPH NODE BIOPSY/RECONSTRUCTION/PLACEMENT OF TISSUE EXPANDER Right 05/19/2016   Procedure: RIGHT NIPPLE SPARING MASTECTOMY WITH SENTINAL LYMPH NODE BIOPSY WITH BLUE DYE INJECTION;  Surgeon: Rolm Bookbinder, MD;  Location: [facility];  Service: General;  Laterality: Right;  . PORTACATH PLACEMENT N/A 12/30/2015   Procedure: INSERTION PORT-A-CATH WITH [place];  Surgeon: Rolm Bookbinder, MD;  Location: [facility];  Service: General;  Laterality: N/A;  . REDUCTION MAMMAPLASTY    . REMOVAL OF TISSUE EXPANDER AND PLACEMENT OF IMPLANT Right 08/11/2016   Procedure: REMOVAL OF RIGHT  TISSUE EXPANDER AND PLACEMENT OF IMPLANT;  Surgeon: Irene Limbo, MD;  Location: [facility];  Service: Plastics;  Laterality: Right;   Social History   Socioeconomic History  . Marital status: Married    Spouse name: Not on file  . Number of children: 2  . Years of education: Not on file  . Highest education level: Not on file  Occupational History  . Occupation: medical receptionist  Social Needs  . Financial resource strain: Not on file  . Food insecurity:    Worry: Not on file    Inability: Not on file  . Transportation needs:    Medical: Not on file    Non-medical: Not on file  Tobacco Use  . Smoking status: Never Smoker  . Smokeless tobacco: Never Used  Substance and Sexual Activity  . Alcohol use: Yes  Comment: occasionally  . Drug use: No  . Sexual activity: Not on file  Lifestyle  . Physical activity:    Days per week: Not on file    Minutes per session: Not on file  . Stress: Not on file  Relationships  . Social connections:    Talks on phone: Not on file    Gets together: Not on file    Attends religious service: Not on file    Active member of club or organization: Not on file    Attends meetings of clubs or organizations: Not on file    Relationship status: Not on file  Other Topics Concern  . Not on file    Social History Narrative   Married 1978 (Al). 2 girls 33 and 36 in 2018. [name] lives in [place], [place] with 68 yo son (single right now). and [name] in [place], [place]--> now [place] with husband and 38 year old daughter (born with heart defect with missing valve and initially not closed VSD--still weighs heavily on family- likely will have to be repaired).       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    Graduated from [employer]. [employer] prior.       Hobbies: gardening, decorating, sewing, enjoys creating   No Known Allergies Family History  Problem Relation Age of Onset  . Breast cancer Maternal Aunt        dx in her 31s  . Stroke Father        late 15s  . Hypertension Father   . Pulmonary fibrosis Mother   . Breast cancer Paternal Aunt 78  . Cancer Paternal Grandmother        abdominal; cervical vs uterine dx in her 18s  . Parkinson's disease Maternal Grandfather   . Anesthesia problems Daughter        post-op nausea     Past medical history, social, surgical and family history all reviewed in electronic medical record.  No pertanent information unless stated regarding to the chief complaint.   Review of Systems:Review of systems updated and as accurate as of 09/12/17  No headache, visual changes, nausea, vomiting, diarrhea, constipation, dizziness, abdominal pain, skin rash, fevers, chills, night sweats, weight loss, swollen lymph nodes, body aches, joint swelling, muscle aches, chest pain, shortness of breath, mood changes.   Objective  There were no vitals taken for this visit. Systems examined below as of 09/12/17   General: No apparent distress alert and oriented x3 mood and affect normal, dressed appropriately.  HEENT: Pupils equal, extraocular movements intact  Respiratory: Patient's speak in full sentences and does not appear short of breath  Cardiovascular: No lower extremity edema,  non tender, no erythema  Skin: Warm dry intact with no signs of infection or rash on extremities or on axial skeleton.  Abdomen: Soft nontender  Neuro: Cranial nerves II through XII are intact, neurovascularly intact in all extremities with 2+ DTRs and 2+ pulses.  Lymph: No lymphadenopathy of posterior or anterior cervical chain or axillae bilaterally.  Gait normal with good balance and coordination.  MSK:  Non tender with full range of motion and good stability and symmetric strength and tone of shoulders, elbows, wrist, hip, knee and ankles bilaterally.     Impression and Recommendations:     This case required medical decision making of moderate complexity.      Note: This dictation was prepared with Viviann Spare  dictation along with smaller phrase technology. Any transcriptional errors that result from this process are unintentional.

## 2017-09-14 ENCOUNTER — Ambulatory Visit: Payer: BLUE CROSS/BLUE SHIELD | Admitting: Family Medicine

## 2017-09-14 DIAGNOSIS — Z0289 Encounter for other administrative examinations: Secondary | ICD-10-CM

## 2017-09-15 ENCOUNTER — Telehealth: Payer: Self-pay | Admitting: Hematology and Oncology

## 2017-09-15 NOTE — Telephone Encounter (Signed)
Patient called to reschedule  °

## 2017-09-21 MED FILL — LETROZOLE 2.5 MG TABLET: 2.5 | 90 days supply | Qty: 90 | Fill #1

## 2017-09-22 ENCOUNTER — Ambulatory Visit: Payer: BLUE CROSS/BLUE SHIELD | Admitting: Hematology and Oncology

## 2017-10-13 MED FILL — ZOLPIDEM TARTRATE 5 MG TAB: 5 | 30 days supply | Qty: 30 | Fill #1

## 2017-10-14 ENCOUNTER — Ambulatory Visit: Payer: BLUE CROSS/BLUE SHIELD | Admitting: Hematology and Oncology

## 2017-10-14 ENCOUNTER — Telehealth: Payer: Self-pay | Admitting: Hematology and Oncology

## 2017-10-19 ENCOUNTER — Inpatient Hospital Stay: Payer: BLUE CROSS/BLUE SHIELD | Attending: Hematology and Oncology | Admitting: Hematology and Oncology

## 2017-10-19 DIAGNOSIS — M810 Age-related osteoporosis without current pathological fracture: Secondary | ICD-10-CM | POA: Insufficient documentation

## 2017-10-19 DIAGNOSIS — C50211 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 female breast: Secondary | ICD-10-CM

## 2017-10-19 DIAGNOSIS — Z170 Estrogen receptor positive status [ER+]: Secondary | ICD-10-CM | POA: Insufficient documentation

## 2017-10-19 MED ORDER — LETROZOLE 2.5 MG PO TABS: 2.5000 mg | ORAL_TABLET | Freq: Every day | ORAL | 3 refills | Status: DC

## 2017-10-19 MED ORDER — ZOLPIDEM TARTRATE 5 MG PO TABS: 2.5000 mg | ORAL_TABLET | Freq: Every evening | ORAL | 1 refills | Status: DC | PRN

## 2017-10-19 NOTE — Progress Notes (Signed)
Patient Care Team: [name], MD as PCP - General (Family Medicine)  DIAGNOSIS:  Encounter Diagnosis  Name Primary?  . 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 breast in female, estrogen receptor positive ([place])     SUMMARY OF ONCOLOGIC HISTORY:   Breast cancer of upper-inner quadrant of right female breast ([facility])   12/05/2015 Mammogram    Right breast mass 2:00 4 cm from nipple: 1.7 cm, T1c N0 stage IA; right breast 2:00 6 cm from nipple 1.3 cm mass, 7 mm satellite nodule between the 2 masses, T1 cN0 stage IA      12/06/2015 Initial Diagnosis    Right breast biopsy 2:00 6 cm from nipple: Grade 2-3 invasive ductal cancer, ER 90%, PR 2%, Ki-67 80%, HER-2 positive ratio 2.07; right biopsy 2:00 4 cm from nipple: IDC with DCIS, ER 95%, PR 10%, Ki-67 90%, HER-2 negative ratio 1.27      12/18/2015 Breast MRI    3 enhancing masses in the upper inner quadrant right breast spanning an area 3.7 cm (1.7 cm, 1 cm, 1.4 cm) no lymph node enlargement      12/31/2015 - 04/15/2016 Neo-Adjuvant Chemotherapy    [facility] Perjeta 6 cycles followed by Herceptin, Perjeta maintenance for 1 year completed 10/20/2016      01/08/2016 - 01/12/2016 Hospital Admission    Neutropenic fever hospitalization (because patient did not receive Neulasta with cycle 1)      01/10/2016 Miscellaneous    Genetic testing was normal did not reveal any mutations      04/16/2016 Breast MRI    Near CR to therapy. Previous 2 biopsied massses are no longer seen. Third satellite lesion is smaller from 10 mm to 5.3 mm, no abnormal LN.      05/19/2016 Surgery    Right mastectomy: IDC grade 3, 1.3 cm, low-grade DCIS, LVIDS present, margins negative, 0/1 lymph node negative, ypT1CypN0 stage IA; repeat HER-2 negative ratio 1.41      05/19/2016 Surgery    Right breast reconstruction with tissue expander. Acellular dermis for breast reconstruction (Dr.Thimappa)       06/22/2016 -  Anti-estrogen oral therapy    Letrozole  2.5 mg daily       CHIEF COMPLIANT: Follow-up on letrozole therapy  INTERVAL HISTORY: Patricia Chandler is a 62 year old with above-mentioned history of right breast cancer treated with mastectomy after neoadjuvant chemotherapy.  She underwent right breast reconstruction.  She is currently on letrozole therapy and appears to be tolerating it very well.  She denies any hot flashes or myalgias.  REVIEW OF SYSTEMS:   Constitutional: Denies fevers, chills or abnormal weight loss Eyes: Denies blurriness of vision Ears, nose, mouth, throat, and face: Denies mucositis or sore throat Respiratory: Denies cough, dyspnea or wheezes Cardiovascular: Denies palpitation, chest discomfort Gastrointestinal:  Denies nausea, heartburn or change in bowel habits Skin: Denies abnormal skin rashes Lymphatics: Denies new lymphadenopathy or easy bruising Neurological:Denies numbness, tingling or new weaknesses Behavioral/Psych: Mood is stable, no new changes  Extremities: No lower extremity edema Breast: Right breast reconstructed.  No palpable lumps or nodules in the left breast All other systems were reviewed with the patient and are negative.  I have reviewed the past medical history, past surgical history, social history and family history with the patient and they are unchanged from previous note.  ALLERGIES:  has No Known Allergies.  MEDICATIONS:  Current Outpatient Medications  Medication Sig Dispense Refill  . denosumab (PROLIA) 60 MG/ML SOLN injection Inject 60  mg into the skin every 6 (six) months. Administer in upper arm, thigh, or abdomen    . letrozole (FEMARA) 2.5 MG tablet Take 2.5 mg by mouth daily.    . Vitamin D, Ergocalciferol, (DRISDOL) 50000 units CAPS capsule Take 1 capsule (50,000 Units total) by mouth every 7 (seven) days. 12 capsule 0  . zolpidem (AMBIEN) 5 MG tablet TAKE 1 TABLET BY MOUTH AT BEDTIME AS NEEDED FOR SLEEP 30 tablet 1   No current facility-administered medications  for this visit.     PHYSICAL EXAMINATION: ECOG PERFORMANCE STATUS: 1 - Symptomatic but completely ambulatory  Vitals:   10/19/17 0916  BP: 120/78  Pulse: 79  Resp: 18  Temp: 98.4 F (36.9 C)  SpO2: 100%   Filed Weights   10/19/17 0916  Weight: 149 lb 8 oz (67.8 kg)    GENERAL:alert, no distress and comfortable SKIN: skin color, texture, turgor are normal, no rashes or significant lesions EYES: normal, Conjunctiva are pink and non-injected, sclera clear OROPHARYNX:no exudate, no erythema and lips, buccal mucosa, and tongue normal  NECK: supple, thyroid normal size, non-tender, without nodularity LYMPH:  no palpable lymphadenopathy in the cervical, axillary or inguinal LUNGS: clear to auscultation and percussion with normal breathing effort HEART: regular rate & rhythm and no murmurs and no lower extremity edema ABDOMEN:abdomen soft, non-tender and normal bowel sounds MUSCULOSKELETAL:no cyanosis of digits and no clubbing  NEURO: alert & oriented x 3 with fluent speech, no focal motor/sensory deficits EXTREMITIES: No lower extremity edema BREAST: No palpable lumps or nodules in left breast.  Right breast reconstructed.  No axillary adenopathy (exam performed in the presence of a chaperone)  LABORATORY DATA:  I have reviewed the data as listed CMP Latest Ref Rng & Units 02/01/2017 10/20/2016 09/29/2016  Glucose 70 - 140 mg/dl - 111 86  BUN 7.0 - 26.0 mg/dL - 22.7 19.7  Creatinine 0.6 - 1.1 mg/dL - 0.8 0.8  Sodium 136 - 145 mEq/L - 140 139  Potassium 3.5 - 5.1 mEq/L - 3.9 4.1  Chloride 101 - 111 mmol/L - - -  CO2 22 - 29 mEq/L - 24 24  Calcium 8.6 - 10.4 mg/dL 9.6 9.3 9.3  Total Protein 6.4 - 8.3 g/dL - 5.9(L) 6.0(L)  Total Bilirubin 0.20 - 1.20 mg/dL - 0.39 0.47  Alkaline Phos 40 - 150 U/L - 54 56  AST 5 - 34 U/L - 16 21  ALT 0 - 55 U/L - 16 18    Lab Results  Component Value Date   WBC 5.5 10/20/2016   HGB 11.3 (L) 10/20/2016   HCT 32.9 (L) 10/20/2016   MCV 89.2  10/20/2016   PLT 186 10/20/2016   NEUTROABS 3.0 10/20/2016    ASSESSMENT & PLAN:  Breast cancer of upper-inner quadrant of right female breast ([facility]) Right breast biopsy 2:00 6 cm from nipple: Grade 2-3 invasive ductal cancer, ER 90%, PR 2%, Ki-67 80%, HER-2 positive ratio 2.07; right biopsy 2:00 4 cm from nipple: IDC with DCIS, ER 95%, PR 10%, Ki-67 90%, HER-2 negative ratio 1.27 Breast MRI08/30/2017: 3 enhancing masses in the upper inner quadrant right breast spanning an area 3.7 cm (1.7 cm, 1 cm, 1.4 cm) no lymph node enlargement Clinical stage: T2N0 (stage 2A)  Treatment plan: 1. Genetic counseling: Normal did not reveal any abnormal mutations 2. Neoadjuvant chemotherapy. [facility] Perjeta 6 cycles followed by Herceptin And Perjetamaintenance completed 10/20/2016) from 12/31/15 to 04/15/16 3. Right mastectomy with reconstruction: 05/19/2016:Right mastectomy: IDC grade 3, 1.3  cm, low-grade DCIS, LVIDS present, margins negative, 0/1 lymph node negative, ypT1CypN0 stage IA; repeat HER-2 negative ratio 1.41 4. Adjuvant antiestrogen therapy With letrozole 2.5 mg daily to be started March 2018 -------------------------------------------------------------------------------------------------------------------------------------------------- Current treatment: 1. Antiestrogen therapy with letrozole 2.5 mg daily started March 2018 2. Consideration for Neratinib: Patient did not want to receive Neratinib  Bone density 01/04/2017: T score -2.7 osteoporosis Osteoporosis: Patient will need bisphosphonate therapy.  Breast cancer surveillance: 1.  Breast exam 10/19/2017: Benign right reconstructed breast with artificial nipple made from silicone (naturally impressive is the name of the product) 2. mammogram 03/08/2017: Right mastectomy, no lumps or nodules left breast Return to clinic in 1 year    No orders of the defined types were placed in this encounter.  The patient has a good understanding of the  overall plan. she agrees with it. she will call with any problems that may develop before the next visit here.   Harriette Ohara, MD 10/19/17

## 2017-10-19 NOTE — Assessment & Plan Note (Signed)
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 Genetic counseling: Normal did not reveal any abnormal mutations 2. Neoadjuvant chemotherapy. [facility] 6 cycles followed by Herceptin And Perjetamaintenance completed 10/20/2016) from 12/31/15 to 04/15/16 3. Right mastectomy with reconstruction: 05/19/2016:Right mastectomy: IDC grade 3, 1.3 cm, low-grade DCIS, LVIDS present, margins negative, 0/1 lymph node negative, [id number] stage IA; repeat HER-2 negative ratio 1.41 4. Adjuvant antiestrogen therapy With letrozole 2.5 mg daily to be started March 2018 -------------------------------------------------------------------------------------------------------------------------------------------------- Current treatment: 1. Antiestrogen therapy with letrozole 2.5 mg daily started March 2018 2. Consideration for Neratinib: Patient did not want to receive Neratinib  Bone density 01/04/2017: T score -2.7 osteoporosis Osteoporosis: Patient will need bisphosphonate therapy.  Breast cancer surveillance: 1.  Breast exam 10/19/2017: Benign 2. mammogram 03/08/2017: Right mastectomy, no lumps or nodules left breast Return to clinic in 1 year

## 2017-10-20 ENCOUNTER — Telehealth: Payer: Self-pay | Admitting: Hematology and Oncology

## 2017-10-20 ENCOUNTER — Telehealth: Payer: Self-pay | Admitting: Family Medicine

## 2017-10-20 NOTE — Telephone Encounter (Signed)
Left message for patientregarding her Prolia injection.  Insurance was verified and summary of benefits states that patient has a $0 copay. Patient is due on or after 12/15/2017. Left message for patient to call back to schedule.

## 2017-10-20 NOTE — Telephone Encounter (Signed)
Mailed patient calendar of upcoming July 2020 appts.

## 2017-12-06 ENCOUNTER — Other Ambulatory Visit: Payer: Self-pay | Admitting: Adult Health

## 2017-12-07 ENCOUNTER — Ambulatory Visit: Payer: BLUE CROSS/BLUE SHIELD

## 2017-12-07 ENCOUNTER — Telehealth: Payer: Self-pay | Admitting: Adult Health

## 2017-12-07 NOTE — Telephone Encounter (Signed)
Received phone call from [facility] for refill for patient Patricia Chandler.  Do you want to refill it?

## 2017-12-07 NOTE — Telephone Encounter (Signed)
Cecille Rubin, Please call [place] and refill per Dr. Lindi Adie.    Thanks,  [place]

## 2017-12-07 NOTE — Telephone Encounter (Signed)
Yes please renew Thanks Roselie Cirigliano

## 2017-12-08 ENCOUNTER — Telehealth: Payer: Self-pay

## 2017-12-08 MED FILL — ZOLPIDEM TARTRATE 5 MG TAB: 5 | 30 days supply | Qty: 30 | Fill #0

## 2017-12-08 NOTE — Telephone Encounter (Signed)
Called to [place] to renew Ambien for pt per Dr. Lindi Adie.

## 2017-12-15 ENCOUNTER — Ambulatory Visit: Payer: BLUE CROSS/BLUE SHIELD | Admitting: Emergency Medicine

## 2017-12-15 DIAGNOSIS — M810 Age-related osteoporosis without current pathological fracture: Secondary | ICD-10-CM

## 2017-12-15 MED ORDER — DENOSUMAB 60 MG/ML ~~LOC~~ SOSY: 60.0000 mg | PREFILLED_SYRINGE | Freq: Once | SUBCUTANEOUS | Status: DC

## 2017-12-21 MED FILL — LETROZOLE 2.5 MG TABLET: 2.5 | 90 days supply | Qty: 90 | Fill #2

## 2017-12-23 ENCOUNTER — Other Ambulatory Visit: Payer: Self-pay | Admitting: *Deleted

## 2017-12-23 MED ORDER — VITAMIN D (ERGOCALCIFEROL) 1.25 MG (50000 UNIT) PO CAPS: 50000.0000 [IU] | ORAL_CAPSULE | ORAL | 0 refills | Status: DC

## 2018-01-20 ENCOUNTER — Encounter: Payer: Self-pay | Admitting: Family Medicine

## 2018-01-20 ENCOUNTER — Ambulatory Visit: Payer: BLUE CROSS/BLUE SHIELD | Admitting: Family Medicine

## 2018-01-20 VITALS — BP 112/70 | HR 79 | Ht 63.0 in | Wt 144.0 lb

## 2018-01-20 DIAGNOSIS — M999 Biomechanical lesion, unspecified: Secondary | ICD-10-CM

## 2018-01-20 DIAGNOSIS — M5030 Other cervical disc degeneration, unspecified cervical region: Secondary | ICD-10-CM

## 2018-01-20 NOTE — Progress Notes (Signed)
[name] [place] [address] [place], [place] [address] Phone: [phone] Subjective:    I Patricia Chandler am serving as a Education administrator for Dr. Hulan Saas.   CC: Neck pain follow-up  PPI:[id number]  Patricia Chandler is a 62 y.o. female coming in with complaint of neck pain. States that she is doing well today.  Patient has had neck pain for some time.  Some moderate arthritic changes.  Is making improvement though.  Patient has not been seen for some time because she has been doing well.  Some mild increase in tightness but nothing severe.  No recent fevers chills or any abnormal weight gain or loss       Past Medical History:  Diagnosis Date  . Anemia    due to chemo  . Anxiety   . Breast cancer ([place])   . Cardiomyopathy ([place])    mild, per cardiology note; due to Herceptin  . Dental crowns present   . Family history of adverse reaction to anesthesia    pt's daughter has hx. of post-op nausea  . GERD (gastroesophageal reflux disease)   . History of breast cancer 2017   right  . History of chemotherapy    finished chemo 04/15/2016  . Personal history of chemotherapy   . Rash 08/04/2016   under right arm - states recent tick bite; to see MD 08/05/2016  . Runny nose 06/06/2016   clear drainage, per pt.   Past Surgical History:  Procedure Laterality Date  . AUGMENTATION MAMMAPLASTY    . BREAST ENHANCEMENT SURGERY Bilateral 2005  . BREAST RECONSTRUCTION WITH PLACEMENT OF TISSUE EXPANDER AND FLEX HD (ACELLULAR HYDRATED DERMIS) Right 05/19/2016   Procedure: RIGHT BREAST RECONSTRUCTION WITH PLACEMENT OF TISSUE EXPANDER AND  ALLODERM.;  Surgeon: Irene Limbo, MD;  Location: [place];  Service: Plastics;  Laterality: Right;  . COLONOSCOPY    . COLONOSCOPY WITH PROPOFOL  10/02/2014  . DEBRIDEMENT AND CLOSURE WOUND Right 06/08/2016   Procedure: DEBRIDEMENT RIGHT MASTECTOMY FLAP, TISSUE EXPANSION OF RIGHT CHEST;  Surgeon: Irene Limbo,  MD;  Location: [place];  Service: Plastics;  Laterality: Right;  . MASTECTOMY    . MASTOPEXY Left 08/11/2016   Procedure: LEFT BREAST MASTOPEXY;  Surgeon: Irene Limbo, MD;  Location: [place];  Service: Plastics;  Laterality: Left;  . NIPPLE SPARING MASTECTOMY/SENTINAL LYMPH NODE BIOPSY/RECONSTRUCTION/PLACEMENT OF TISSUE EXPANDER Right 05/19/2016   Procedure: RIGHT NIPPLE SPARING MASTECTOMY WITH SENTINAL LYMPH NODE BIOPSY WITH BLUE DYE INJECTION;  Surgeon: Rolm Bookbinder, MD;  Location: [place];  Service: General;  Laterality: Right;  . PORTACATH PLACEMENT N/A 12/30/2015   Procedure: INSERTION PORT-A-CATH WITH [place];  Surgeon: Rolm Bookbinder, MD;  Location: [place];  Service: General;  Laterality: N/A;  . REDUCTION MAMMAPLASTY    . REMOVAL OF TISSUE EXPANDER AND PLACEMENT OF IMPLANT Right 08/11/2016   Procedure: REMOVAL OF RIGHT  TISSUE EXPANDER AND PLACEMENT OF IMPLANT;  Surgeon: Irene Limbo, MD;  Location: [place];  Service: Plastics;  Laterality: Right;   Social History   Socioeconomic History  . Marital status: Married    Spouse name: Not on file  . Number of children: 2  . Years of education: Not on file  . Highest education level: Not on file  Occupational History  . Occupation: medical receptionist  Social Needs  . Financial resource strain: Not on file  . Food insecurity:    Worry: Not on file  Inability: Not on file  . Transportation needs:    Medical: Not on file    Non-medical: Not on file  Tobacco Use  . Smoking status: Never Smoker  . Smokeless tobacco: Never Used  Substance and Sexual Activity  . Alcohol use: Yes    Comment: occasionally  . Drug use: No  . Sexual activity: Not on file  Lifestyle  . Physical activity:    Days per week: Not on file    Minutes per session: Not on file  . Stress: Not on file  Relationships  . Social connections:    Talks on phone: Not on file     Gets together: Not on file    Attends religious service: Not on file    Active member of club or organization: Not on file    Attends meetings of clubs or organizations: Not on file    Relationship status: Not on file  Other Topics Concern  . Not on file  Social History Narrative   Married 1978 (Al). 2 girls 33 and 36 in 2018. [name] lives in [place], [place] with 92 yo son (single right now). and [name] in [place], [place]--> now [place] with husband and 29 year old daughter (born with heart defect with missing valve and initially not closed VSD--still weighs heavily on family- likely will have to be repaired).       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    Graduated from [employer]. [employer] prior.       Hobbies: gardening, decorating, sewing, enjoys creating   No Known Allergies Family History  Problem Relation Age of Onset  . Breast cancer Maternal Aunt        dx in her 80s  . Stroke Father        late 5s  . Hypertension Father   . Pulmonary fibrosis Mother   . Breast cancer Paternal Aunt 78  . Cancer Paternal Grandmother        abdominal; cervical vs uterine dx in her 47s  . Parkinson's disease Maternal Grandfather   . Anesthesia problems Daughter        post-op nausea    Current Outpatient Medications (Endocrine & Metabolic):  .  denosumab (PROLIA) 60 MG/ML SOLN injection, Inject 60 mg into the skin every 6 (six) months. Administer in upper arm, thigh, or abdomen  Current Facility-Administered Medications (Endocrine & Metabolic):  .  denosumab (PROLIA) injection 60 mg          Current Outpatient Medications (Other):  .  letrozole (FEMARA) 2.5 MG tablet, Take 1 tablet (2.5 mg total) by mouth daily. .  Vitamin D, Ergocalciferol, (DRISDOL) 50000 units CAPS capsule, Take 1 capsule (50,000 Units total) by mouth every 7 (seven) days. Marland Kitchen  zolpidem (AMBIEN) 5 MG tablet, Take 0.5 tablets (2.5  mg total) by mouth at bedtime as needed. for sleep     Past medical history, social, surgical and family history all reviewed in electronic medical record.  No pertanent information unless stated regarding to the chief complaint.   Review of Systems:  No headache, visual changes, nausea, vomiting, diarrhea, constipation, dizziness, abdominal pain, skin rash, fevers, chills, night sweats, weight loss, swollen lymph nodes, body aches, joint swelling, muscle aches, chest pain, shortness of breath, mood changes.   Objective  Blood pressure 112/70, pulse 79, height 5\' 3"  (1.6 m), weight 144 lb (65.3 kg), SpO2 97 %.  General: No apparent distress alert and oriented x3 mood and affect normal, dressed appropriately.  HEENT: Pupils equal, extraocular movements intact  Respiratory: Patient's speak in full sentences and does not appear short of breath  Cardiovascular: No lower extremity edema, non tender, no erythema  Skin: Warm dry intact with no signs of infection or rash on extremities or on axial skeleton.  Abdomen: Soft nontender  Neuro: Cranial nerves II through XII are intact, neurovascularly intact in all extremities with 2+ DTRs and 2+ pulses.  Lymph: No lymphadenopathy of posterior or anterior cervical chain or axillae bilaterally.  Gait normal with good balance and coordination.  MSK:  Non tender with full range of motion and good stability and symmetric strength and tone of shoulders, elbows, wrist, hip, knee and ankles bilaterally.  Very mild arthritic changes of multiple joints Neck: Inspection unremarkable. No palpable stepoffs. Negative Spurling's maneuver. Lordosis and patient does have some loss of extension by 5 as well as sidebending bilaterally Grip strength and sensation normal in bilateral hands Strength good C4 to T1 distribution No sensory change to C4 to T1 Negative Hoffman sign bilaterally Reflexes normal Mild tightness in the right trapezius   Osteopathic  findings C2 flexed rotated and side bent right C4 flexed rotated and side bent left C6 flexed rotated and side bent left T3 extended rotated and side bent right inhaled third rib T9 extended rotated and side bent left L2 flexed rotated and side bent right Sacrum right on right    Impression and Recommendations:     This case required medical decision making of moderate complexity. The above documentation has been reviewed and is accurate and complete Lyndal Pulley, DO       Note: This dictation was prepared with Dragon dictation along with smaller phrase technology. Any transcriptional errors that result from this process are unintentional.

## 2018-01-20 NOTE — Patient Instructions (Signed)
Good to see you Ice is your friend See me again in 3 months 

## 2018-01-20 NOTE — Assessment & Plan Note (Signed)
Decision today to treat with OMT was based on Physical Exam  After verbal consent patient was treated with HVLA, ME, FPR techniques in cervical, thoracic, rib lumbar and sacral areas  Patient tolerated the procedure well with improvement in symptoms  Patient given exercises, stretches and lifestyle modifications  See medications in patient instructions if given  Patient will follow up in 4 weeks 

## 2018-01-20 NOTE — Assessment & Plan Note (Signed)
Moderate overall.  Doing relatively well.  Discussed icing regimen and home exercises.  Continue all medications.  Follow-up with me again in 3 months

## 2018-02-08 ENCOUNTER — Other Ambulatory Visit: Payer: Self-pay | Admitting: Hematology and Oncology

## 2018-02-09 ENCOUNTER — Other Ambulatory Visit: Payer: Self-pay | Admitting: Hematology and Oncology

## 2018-02-09 MED FILL — ZOLPIDEM TARTRATE 5 MG TAB: 5 | 30 days supply | Qty: 30 | Fill #0

## 2018-02-28 ENCOUNTER — Ambulatory Visit (INDEPENDENT_AMBULATORY_CARE_PROVIDER_SITE_OTHER): Payer: BLUE CROSS/BLUE SHIELD

## 2018-02-28 DIAGNOSIS — Z23 Encounter for immunization: Secondary | ICD-10-CM | POA: Diagnosis not present

## 2018-03-09 ENCOUNTER — Other Ambulatory Visit: Payer: Self-pay | Admitting: Hematology and Oncology

## 2018-03-09 DIAGNOSIS — Z1231 Encounter for screening mammogram for malignant neoplasm of breast: Secondary | ICD-10-CM

## 2018-03-21 MED FILL — LETROZOLE 2.5 MG TABLET: 2.5 | 90 days supply | Qty: 90 | Fill #3

## 2018-04-01 ENCOUNTER — Ambulatory Visit: Payer: BLUE CROSS/BLUE SHIELD | Admitting: Family Medicine

## 2018-04-01 ENCOUNTER — Encounter: Payer: Self-pay | Admitting: Family Medicine

## 2018-04-01 VITALS — BP 112/82 | HR 88 | Temp 98.6°F | Ht 63.0 in | Wt 153.6 lb

## 2018-04-01 DIAGNOSIS — B9689 Other specified bacterial agents as the cause of diseases classified elsewhere: Secondary | ICD-10-CM | POA: Diagnosis not present

## 2018-04-01 DIAGNOSIS — J329 Chronic sinusitis, unspecified: Secondary | ICD-10-CM | POA: Diagnosis not present

## 2018-04-01 MED ORDER — ALBUTEROL SULFATE HFA 108 (90 BASE) MCG/ACT IN AERS: 2.0000 | INHALATION_SPRAY | Freq: Four times a day (QID) | RESPIRATORY_TRACT | 0 refills | Status: DC | PRN

## 2018-04-01 MED ORDER — AZITHROMYCIN 250 MG PO TABS: ORAL_TABLET | ORAL | 0 refills | Status: DC

## 2018-04-01 NOTE — Patient Instructions (Addendum)
This could primarily be a bad upper respiratory infection but with your sinus tenderness on exam and continued head congestion-I am also worried about possible bacterial sinusitis.  The fact you are improving over the last 2 days is encouraging.  Your lungs are clear today and I do not see an obvious pneumonia.  We opted to print an antibiotic azithromycin for you to take if your symptoms worsen or fail to improve over the next week to cover for the potential bacterial infection and also provide some respiratory coverage.  If you fail to improve on this-please see [place] back.  I am hopeful you do not even have to take it.  Can trial albuterol if reasonable cost for the wheezing at night.  Trial Mucinex 12-hour without dextromethorphan in the daytime and you can continue your Mucinex DM at night.  Make sure to remain well-hydrated.  Thanks much for your time- big things once again are if you get fever or shortness of breath that is not responsive to the antibiotics we need to see you immediately.

## 2018-04-01 NOTE — Progress Notes (Signed)
PCP: [name], MD  Subjective:  Patricia Chandler is a 62 y.o. year old very pleasant female patient who presents with sinusitis symptoms including nasal congestion, sinus tenderness -other symptoms include: Rattle in chest at night. Wheezing at night. Clear runny nose -day of illness:12 -Symptoms are improving slightly over the last 2 days -previous treatments: Mucinex DM at night-helps but makes her too sleepy in the daytime -sick contacts/travel/risks: denies flu exposure.  -She has been able to still be active but the duration of illness concerned her. -She does have a history of pneumonia.  Apparently albuterol was not super helpful when she had that.  ROS-denies fever, SOB, NVD, tooth pain  Pertinent Past Medical History-  Patient Active Problem List   Diagnosis Date Noted  . Port catheter in place 01/08/2016    Priority: High  . Breast cancer of upper-inner quadrant of right female breast ([place]) 12/18/2015    Priority: High  . Chemotherapy-induced thrombocytopenia 03/30/2016    Priority: Medium  . GERD (gastroesophageal reflux disease) 01/08/2016    Priority: Medium  . Genetic testing 01/10/2016    Priority: Low  . History of sepsis 01/08/2016    Priority: Low  . Family history of breast cancer     Priority: Low  . Internal hemorrhoid 01/26/2011    Priority: Low  . Acute bursitis of right shoulder 06/08/2017  . Acute shoulder bursitis, left 03/08/2017  . DDD (degenerative disc disease), cervical 10/08/2016  . Nonallopathic lesion of cervical region 10/08/2016  . Nonallopathic lesion of thoracic region 10/08/2016  . Nonallopathic lesion of lumbosacral region 10/08/2016    Medications- reviewed  Current Outpatient Medications  Medication Sig Dispense Refill  . denosumab (PROLIA) 60 MG/ML SOLN injection Inject 60 mg into the skin every 6 (six) months. Administer in upper arm, thigh, or abdomen    . letrozole (FEMARA) 2.5 MG tablet Take 1 tablet (2.5 mg  total) by mouth daily. 90 tablet 3  . Vitamin D, Ergocalciferol, (DRISDOL) 50000 units CAPS capsule Take 1 capsule (50,000 Units total) by mouth every 7 (seven) days. 12 capsule 0  . zolpidem (AMBIEN) 5 MG tablet TAKE 1 TABLET BY MOUTH AT BEDTIME AS NEEDED FOR SLEEP 30 tablet 0  . albuterol (PROVENTIL HFA;VENTOLIN HFA) 108 (90 Base) MCG/ACT inhaler Inhale 2 puffs into the lungs every 6 (six) hours as needed for wheezing or shortness of breath. 1 Inhaler 0  . azithromycin (ZITHROMAX) 250 MG tablet Take 2 tabs on day 1, then 1 tab daily until finished 6 tablet 0   Current Facility-Administered Medications  Medication Dose Route Frequency Provider Last Rate Last Dose  . denosumab (PROLIA) injection 60 mg  60 mg Subcutaneous Once [name], MD        Objective: BP 112/82 (BP Location: Left Arm, Patient Position: Sitting, Cuff Size: Normal)   Pulse 88   Temp 98.6 F (37 C) (Oral)   Ht 5\' 3"  (1.6 m)   Wt 153 lb 9.6 oz (69.7 kg)   SpO2 95%   BMI 27.21 kg/m  Gen: NAD, resting comfortably HEENT: Turbinates erythematous with clear drainage, TM normal, pharynx mildly erythematous with no tonsilar exudate or edema, bilateral maxillar sinus tenderness CV: RRR no murmurs rubs or gallops Lungs: CTAB no crackles, wheeze, rhonchi Ext: no edema Skin: warm, dry, no rash  Assessment/Plan:  Sinsusitis Bacterial based on: Symptoms >10 days-now on day 12 From AVS:  " This could primarily be a bad upper respiratory infection but with your  sinus tenderness on exam and continued head congestion-I am also worried about possible bacterial sinusitis.  The fact you are improving over the last 2 days is encouraging.  Your lungs are clear today and I do not see an obvious pneumonia.  We opted to print an antibiotic azithromycin for you to take if your symptoms worsen or fail to improve over the next week to cover for the potential bacterial infection and also provide some respiratory coverage.  If you  fail to improve on this-please see [place] back.  I am hopeful you do not even have to take it.  Can trial albuterol if reasonable cost for the wheezing at night.  Trial Mucinex 12-hour without dextromethorphan in the daytime and you can continue your Mucinex DM at night.  Make sure to remain well-hydrated.  Thanks much for your time- big things once again are if you get fever or shortness of breath that is not responsive to the antibiotics we need to see you immediately. "  Finally, we reviewed reasons to return to care including if symptoms worsen or persist or new concerns arise (particularly fever or shortness of breath)  Meds ordered this encounter  Medications  . azithromycin (ZITHROMAX) 250 MG tablet    Sig: Take 2 tabs on day 1, then 1 tab daily until finished    Dispense:  6 tablet    Refill:  0  . albuterol (PROVENTIL HFA;VENTOLIN HFA) 108 (90 Base) MCG/ACT inhaler    Sig: Inhale 2 puffs into the lungs every 6 (six) hours as needed for wheezing or shortness of breath.    Dispense:  1 Inhaler    Refill:  0    Garret Reddish, MD

## 2018-04-22 ENCOUNTER — Ambulatory Visit
Admission: RE | Admit: 2018-04-22 | Discharge: 2018-04-22 | Disposition: A | Discharge status: Home / Self Care | Payer: Medicare Other | Source: Ambulatory Visit | Attending: Hematology and Oncology | Admitting: Hematology and Oncology

## 2018-04-22 DIAGNOSIS — Z1231 Encounter for screening mammogram for malignant neoplasm of breast: Secondary | ICD-10-CM | POA: Diagnosis not present

## 2018-05-10 ENCOUNTER — Other Ambulatory Visit: Payer: Self-pay | Admitting: Hematology and Oncology

## 2018-05-11 MED FILL — ZOLPIDEM TARTRATE 5 MG TAB: 5 | 30 days supply | Qty: 30 | Fill #0

## 2018-06-11 DIAGNOSIS — R109 Unspecified abdominal pain: Secondary | ICD-10-CM | POA: Diagnosis not present

## 2018-06-13 ENCOUNTER — Encounter: Payer: Self-pay | Admitting: Family Medicine

## 2018-06-13 ENCOUNTER — Ambulatory Visit (INDEPENDENT_AMBULATORY_CARE_PROVIDER_SITE_OTHER): Payer: Medicare Other | Admitting: Family Medicine

## 2018-06-13 VITALS — BP 108/70 | HR 95 | Temp 97.9°F | Ht 63.0 in | Wt 154.2 lb

## 2018-06-13 DIAGNOSIS — Z853 Personal history of malignant neoplasm of breast: Secondary | ICD-10-CM

## 2018-06-13 DIAGNOSIS — Z114 Encounter for screening for human immunodeficiency virus [HIV]: Secondary | ICD-10-CM | POA: Diagnosis not present

## 2018-06-13 DIAGNOSIS — Z1159 Encounter for screening for other viral diseases: Secondary | ICD-10-CM | POA: Diagnosis not present

## 2018-06-13 DIAGNOSIS — R1032 Left lower quadrant pain: Secondary | ICD-10-CM

## 2018-06-13 MED ORDER — AMOXICILLIN-POT CLAVULANATE 875-125 MG PO TABS: 1.0000 | ORAL_TABLET | Freq: Two times a day (BID) | ORAL | 0 refills | Status: DC

## 2018-06-13 NOTE — Progress Notes (Signed)
Phone [phone]   Subjective:  Patricia Chandler is a 63 y.o. year old very pleasant female patient who presents for/with See problem oriented charting ROS-no fever, chills, vomiting.  Does have low appetite and lower abdominal pain.  Denies dysuria or polyuria  Past Medical History-  Patient Active Problem List   Diagnosis Date Noted  . Port catheter in place 01/08/2016    Priority: High  . Breast cancer of upper-inner quadrant of right female breast ([place]) 12/18/2015    Priority: High  . Chemotherapy-induced thrombocytopenia 03/30/2016    Priority: Medium  . GERD (gastroesophageal reflux disease) 01/08/2016    Priority: Medium  . Genetic testing 01/10/2016    Priority: Low  . History of sepsis 01/08/2016    Priority: Low  . Family history of breast cancer     Priority: Low  . Internal hemorrhoid 01/26/2011    Priority: Low  . Acute bursitis of right shoulder 06/08/2017  . Acute shoulder bursitis, left 03/08/2017  . DDD (degenerative disc disease), cervical 10/08/2016  . Nonallopathic lesion of cervical region 10/08/2016  . Nonallopathic lesion of thoracic region 10/08/2016  . Nonallopathic lesion of lumbosacral region 10/08/2016    Medications- reviewed and updated Current Outpatient Medications  Medication Sig Dispense Refill  . albuterol (PROVENTIL HFA;VENTOLIN HFA) 108 (90 Base) MCG/ACT inhaler Inhale 2 puffs into the lungs every 6 (six) hours as needed for wheezing or shortness of breath. 1 Inhaler 0  . azithromycin (ZITHROMAX) 250 MG tablet Take 2 tabs on day 1, then 1 tab daily until finished 6 tablet 0  . denosumab (PROLIA) 60 MG/ML SOLN injection Inject 60 mg into the skin every 6 (six) months. Administer in upper arm, thigh, or abdomen    . letrozole (FEMARA) 2.5 MG tablet Take 1 tablet (2.5 mg total) by mouth daily. 90 tablet 3  . Vitamin D, Ergocalciferol, (DRISDOL) 50000 units CAPS capsule Take 1 capsule (50,000 Units total) by mouth every 7 (seven) days.  12 capsule 0  . zolpidem (AMBIEN) 5 MG tablet TAKE ONE TABLET BY MOUTH AT BEDTIME AS NEEDED FOR SLEEP 30 tablet 0  . amoxicillin-clavulanate (AUGMENTIN) 875-125 MG tablet Take 1 tablet by mouth 2 (two) times daily. 20 tablet 0   Current Facility-Administered Medications  Medication Dose Route Frequency Provider Last Rate Last Dose  . denosumab (PROLIA) injection 60 mg  60 mg Subcutaneous Once [name], MD         Objective:  BP 108/70 (BP Location: Left Arm, Patient Position: Sitting, Cuff Size: Normal)   Pulse 95   Temp 97.9 F (36.6 C) (Oral)   Ht 5\' 3"  (1.6 m)   Wt 154 lb 3.2 oz (69.9 kg)   SpO2 97%   BMI 27.32 kg/m  Gen: NAD, resting comfortably Moist mucous membranes CV: RRR no murmurs rubs or gallops Lungs: CTAB no crackles, wheeze, rhonchi Abdomen: soft/nontender in upper abdomen- in left lower quadrant there is an area that she is very tender-otherwise mild tenderness throughout lower abdomen without rebounding or guarding/nondistended/normal bowel sounds.  Ext: no edema Skin: warm, dry Neuro: Normal gait and speech    Assessment and Plan   #Left lower quadrant abdominal pain - Plan: CT Abdomen Pelvis W Contrast, Comprehensive metabolic panel, CBC with Differential/Platelet #History of breast cancer - Plan: CT Abdomen Pelvis W Contrast S: 3 days of abdominal pain. Has been in bed during this time- writhing in pain. She is concerned about diverticulitis flare- thinks she may have had some less  severe episodes in the past. Has high fiber diet as a result. Reports history of severe abdominal pain in first that ended up being UTI.   Saturday was the worst pain up to 9/10. Describes as alternating pattern- ache, burning, doubling over writhing type pain.   Pain seems to be in lower abdomen for the most part. Occasionally radiates to the back.  Went to [facility] type set up on Saturday- to make sure no UTI- awaiting results tomorrow.   Denies upper abdominal  pain. Low appetite. Denies nausea. No vomiting. Pain worsens when she eats so she has avoided eating. Does ok with water, tea, jello. Has been weak due to the pain. Did ok with banana last night. No fever.   Pain is chronic over the 3 days. Took tylenol today and gave some relief- had been taking some ibuporfen which she realized its not the best idea.   Was on protonix when on chemotherapy in past- was able to come off afterwards. Mild intermittent issues.  A/P: 63 year old female presents with left and right lower quadrant pain-with pinpoint tenderness in left lower quadrant.  I am most concerned about diverticulitis-has diverticulosis on prior colonoscopies.  Due to never having confirmed case of diverticulitis and in light of breast cancer history (though we discussed low risk for mets to the abdomen) we opted to get CT scan of abdomen and pelvis.  Pain is improving somewhat-opted to use Augmentin over more aggressive Cipro/Flagyl.  She states she may need to cut the pill.  Not repeating urine testing as had recently at urgent care   Future Appointments  Date Time Provider [place]  10/20/2018  9:15 AM Nicholas Lose, MD [facility] None   Lab/Order associations: Screening for HIV (human immunodeficiency virus) - Plan: HIV Antibody (routine testing w rflx)  Left lower quadrant abdominal pain - Plan: CT Abdomen Pelvis W Contrast, Comprehensive metabolic panel, CBC with Differential/Platelet  Encounter for hepatitis C screening test for low risk patient - Plan: Hepatitis C antibody  History of breast cancer - Plan: CT Abdomen Pelvis W Contrast  Meds ordered this encounter  Medications  . amoxicillin-clavulanate (AUGMENTIN) 875-125 MG tablet    Sig: Take 1 tablet by mouth 2 (two) times daily.    Dispense:  20 tablet    Refill:  0   Return precautions advised.  We specifically discussed indications to go to the hospital such as worsening pain despite treatment or fever Garret Reddish, MD

## 2018-06-13 NOTE — Patient Instructions (Addendum)
Health Maintenance Due  Topic Date Due  . PNA vac Low Risk Adult (1 of 2 - PCV13) Pt declined until she feels better 04/29/2018   Wait in room for CT to get scheduled and for them to bring contrast and instructions.   I sent in augmentin- I am fine if you want to start taking this or hold off unless worsening symptoms.   New or worsening symptoms- would advise going to the hospital   Please stop by lab before you go If you do not have mychart- we will call you about results within 5 business days of [place] receiving them.  If you have mychart- we will send your results within 3 business days of [place] receiving them.  If abnormal or we want to clarify a result, we will call or mychart you to make sure you receive the message.  If you have questions or concerns or don't hear within 5-7 days, please send [place] a message or call us.

## 2018-06-14 ENCOUNTER — Other Ambulatory Visit: Payer: Self-pay

## 2018-06-14 ENCOUNTER — Ambulatory Visit
Admission: RE | Admit: 2018-06-14 | Discharge: 2018-06-14 | Disposition: A | Discharge status: Home / Self Care | Payer: Medicare Other | Source: Ambulatory Visit | Attending: Family Medicine | Admitting: Family Medicine

## 2018-06-14 ENCOUNTER — Ambulatory Visit: Payer: Self-pay | Admitting: Family Medicine

## 2018-06-14 ENCOUNTER — Telehealth: Payer: Self-pay

## 2018-06-14 ENCOUNTER — Inpatient Hospital Stay (HOSPITAL_COMMUNITY)
Admission: EM | Admit: 2018-06-14 | Discharge: 2018-06-16 | DRG: 392 | Disposition: A | Discharge status: Home / Self Care | Payer: Medicare Other | Attending: Internal Medicine | Admitting: Internal Medicine

## 2018-06-14 ENCOUNTER — Encounter (HOSPITAL_COMMUNITY): Payer: Self-pay | Admitting: Emergency Medicine

## 2018-06-14 DIAGNOSIS — D1809 Hemangioma of other sites: Secondary | ICD-10-CM | POA: Diagnosis present

## 2018-06-14 DIAGNOSIS — F419 Anxiety disorder, unspecified: Secondary | ICD-10-CM | POA: Diagnosis present

## 2018-06-14 DIAGNOSIS — Z803 Family history of malignant neoplasm of breast: Secondary | ICD-10-CM

## 2018-06-14 DIAGNOSIS — K651 Peritoneal abscess: Secondary | ICD-10-CM | POA: Diagnosis not present

## 2018-06-14 DIAGNOSIS — Z79811 Long term (current) use of aromatase inhibitors: Secondary | ICD-10-CM | POA: Diagnosis not present

## 2018-06-14 DIAGNOSIS — K5730 Diverticulosis of large intestine without perforation or abscess without bleeding: Secondary | ICD-10-CM | POA: Diagnosis not present

## 2018-06-14 DIAGNOSIS — K5792 Diverticulitis of intestine, part unspecified, without perforation or abscess without bleeding: Secondary | ICD-10-CM | POA: Diagnosis present

## 2018-06-14 DIAGNOSIS — K5720 Diverticulitis of large intestine with perforation and abscess without bleeding: Principal | ICD-10-CM | POA: Diagnosis present

## 2018-06-14 DIAGNOSIS — K219 Gastro-esophageal reflux disease without esophagitis: Secondary | ICD-10-CM | POA: Diagnosis not present

## 2018-06-14 DIAGNOSIS — Z9689 Presence of other specified functional implants: Secondary | ICD-10-CM | POA: Diagnosis present

## 2018-06-14 DIAGNOSIS — E876 Hypokalemia: Secondary | ICD-10-CM | POA: Diagnosis present

## 2018-06-14 DIAGNOSIS — Z853 Personal history of malignant neoplasm of breast: Secondary | ICD-10-CM

## 2018-06-14 DIAGNOSIS — R1032 Left lower quadrant pain: Secondary | ICD-10-CM

## 2018-06-14 DIAGNOSIS — Z79899 Other long term (current) drug therapy: Secondary | ICD-10-CM

## 2018-06-14 DIAGNOSIS — Z9221 Personal history of antineoplastic chemotherapy: Secondary | ICD-10-CM | POA: Diagnosis not present

## 2018-06-14 DIAGNOSIS — K5780 Diverticulitis of intestine, part unspecified, with perforation and abscess without bleeding: Secondary | ICD-10-CM | POA: Diagnosis not present

## 2018-06-14 DIAGNOSIS — I428 Other cardiomyopathies: Secondary | ICD-10-CM | POA: Diagnosis not present

## 2018-06-14 LAB — COMPREHENSIVE METABOLIC PANEL
ALT: 11 U/L (ref 0–35)
ALT: 15 U/L (ref 0–44)
AST: 15 U/L (ref 0–37)
AST: 20 U/L (ref 15–41)
Albumin: 4.5 g/dL (ref 3.5–5.2)
Albumin: 4.3 g/dL (ref 3.5–5.0)
Alkaline Phosphatase: 71 U/L (ref 39–117)
Alkaline Phosphatase: 64 U/L (ref 38–126)
Anion gap: 8 (ref 5–15)
BUN: 13 mg/dL (ref 6–23)
BUN: 10 mg/dL (ref 8–23)
CHLORIDE: 99 meq/L (ref 96–112)
CO2: 26 mEq/L (ref 19–32)
CO2: 26 mmol/L (ref 22–32)
Calcium: 9.5 mg/dL (ref 8.4–10.5)
Calcium: 8.6 mg/dL — ABNORMAL LOW (ref 8.9–10.3)
Chloride: 102 mmol/L (ref 98–111)
Creatinine, Ser: 0.74 mg/dL (ref 0.40–1.20)
Creatinine, Ser: 0.76 mg/dL (ref 0.44–1.00)
GFR calc Af Amer: >60 mL/min (ref >60)
GFR calc non Af Amer: >60 mL/min (ref >60)
GFR: 78.73 mL/min (ref >60.00)
GLUCOSE: 97 mg/dL (ref 70–99)
Glucose, Bld: 81 mg/dL (ref 70–99)
Potassium: 4.1 mEq/L (ref 3.5–5.1)
Potassium: 3 mmol/L — ABNORMAL LOW (ref 3.5–5.1)
Sodium: 136 mEq/L (ref 135–145)
Sodium: 136 mmol/L (ref 135–145)
TOTAL PROTEIN: 7.6 g/dL (ref 6.5–8.1)
Total Bilirubin: 0.6 mg/dL (ref 0.2–1.2)
Total Bilirubin: 0.8 mg/dL (ref 0.3–1.2)
Total Protein: 6.9 g/dL (ref 6.0–8.3)

## 2018-06-14 LAB — CBC WITH DIFFERENTIAL/PLATELET
Abs Immature Granulocytes: 0.03 10*3/uL (ref 0.00–0.07)
Basophils Absolute: 0.1 10*3/uL (ref 0.0–0.1)
Basophils Absolute: 0 10*3/uL (ref 0.0–0.1)
Basophils Relative: 1.1 % (ref 0.0–3.0)
Basophils Relative: 0 %
Eosinophils Absolute: 0.4 10*3/uL (ref 0.0–0.7)
Eosinophils Absolute: 0.3 10*3/uL (ref 0.0–0.5)
Eosinophils Relative: 3.3 % (ref 0.0–5.0)
Eosinophils Relative: 3 %
HCT: 42.6 % (ref 36.0–46.0)
HCT: 43.4 % (ref 36.0–46.0)
Hemoglobin: 14.3 g/dL (ref 12.0–15.0)
Hemoglobin: 14.2 g/dL (ref 12.0–15.0)
Immature Granulocytes: 0 %
Lymphocytes Relative: 18.4 % (ref 12.0–46.0)
Lymphocytes Relative: 21 %
Lymphs Abs: 2.2 10*3/uL (ref 0.7–4.0)
Lymphs Abs: 1.9 10*3/uL (ref 0.7–4.0)
MCH: 29.4 pg (ref 26.0–34.0)
MCHC: 33.6 g/dL (ref 30.0–36.0)
MCHC: 32.7 g/dL (ref 30.0–36.0)
MCV: 88.6 fl (ref 78.0–100.0)
MCV: 89.9 fL (ref 80.0–100.0)
MONOS PCT: 6.5 % (ref 3.0–12.0)
Monocytes Absolute: 0.8 10*3/uL (ref 0.1–1.0)
Monocytes Absolute: 0.6 10*3/uL (ref 0.1–1.0)
Monocytes Relative: 6 %
NRBC: 0 % (ref 0.0–0.2)
Neutro Abs: 8.3 10*3/uL — ABNORMAL HIGH (ref 1.4–7.7)
Neutro Abs: 6.5 10*3/uL (ref 1.7–7.7)
Neutrophils Relative %: 70.7 % (ref 43.0–77.0)
Neutrophils Relative %: 70 %
Platelets: 266 10*3/uL (ref 150.0–400.0)
Platelets: 276 10*3/uL (ref 150–400)
RBC: 4.8 Mil/uL (ref 3.87–5.11)
RBC: 4.83 MIL/uL (ref 3.87–5.11)
RDW: 14.1 % (ref 11.5–15.5)
RDW: 13 % (ref 11.5–15.5)
WBC: 11.8 10*3/uL — ABNORMAL HIGH (ref 4.0–10.5)
WBC: 9.3 10*3/uL (ref 4.0–10.5)

## 2018-06-14 LAB — HEPATITIS C ANTIBODY
Hepatitis C Ab: NONREACTIVE
SIGNAL TO CUT-OFF: 0.01 (ref <1.00)

## 2018-06-14 LAB — HIV ANTIBODY (ROUTINE TESTING W REFLEX): HIV 1&2 Ab, 4th Generation: NONREACTIVE

## 2018-06-14 LAB — LACTIC ACID, PLASMA: Lactic Acid, Venous: 0.8 mmol/L (ref 0.5–1.9)

## 2018-06-14 MED ORDER — SODIUM CHLORIDE 0.9 % IV SOLN
Freq: Once | INTRAVENOUS | Status: AC
  Administered 2018-06-14: 17:00:00 via INTRAVENOUS

## 2018-06-14 MED ORDER — POTASSIUM CHLORIDE 10 MEQ/100ML IV SOLN
INTRAVENOUS | Status: AC
  Filled 2018-06-14: qty 100

## 2018-06-14 MED ORDER — OXYCODONE HCL 5 MG PO TABS
5.0000 mg | ORAL_TABLET | ORAL | Status: DC | PRN
  Filled 2018-06-14: qty 1

## 2018-06-14 MED ORDER — ACETAMINOPHEN 325 MG PO TABS: 650.0000 mg | ORAL_TABLET | Freq: Four times a day (QID) | ORAL | Status: DC | PRN

## 2018-06-14 MED ORDER — SODIUM CHLORIDE 0.9 % IV BOLUS
1000.0000 mL | Freq: Once | INTRAVENOUS | Status: AC
  Administered 2018-06-14: 1000 mL via INTRAVENOUS

## 2018-06-14 MED ORDER — PIPERACILLIN-TAZOBACTAM 3.375 G IVPB
3.3750 g | Freq: Three times a day (TID) | INTRAVENOUS | Status: DC
  Administered 2018-06-14 – 2018-06-16 (×5): 3.375 g via INTRAVENOUS
  Filled 2018-06-14 (×5): qty 50

## 2018-06-14 MED ORDER — POLYETHYLENE GLYCOL 3350 17 G PO PACK: 17.0000 g | PACK | Freq: Every day | ORAL | Status: DC | PRN

## 2018-06-14 MED ORDER — POTASSIUM CHLORIDE CRYS ER 20 MEQ PO TBCR
30.0000 meq | EXTENDED_RELEASE_TABLET | ORAL | Status: AC
  Administered 2018-06-14 (×2): 30 meq via ORAL
  Filled 2018-06-14 (×2): qty 1

## 2018-06-14 MED ORDER — ONDANSETRON HCL 4 MG PO TABS: 4.0000 mg | ORAL_TABLET | Freq: Four times a day (QID) | ORAL | Status: DC | PRN

## 2018-06-14 MED ORDER — ONDANSETRON HCL 4 MG/2ML IJ SOLN: 4.0000 mg | Freq: Four times a day (QID) | INTRAMUSCULAR | Status: DC | PRN

## 2018-06-14 MED ORDER — ZOLPIDEM TARTRATE 5 MG PO TABS
2.5000 mg | ORAL_TABLET | Freq: Every evening | ORAL | Status: DC | PRN
  Administered 2018-06-14 – 2018-06-15 (×2): 2.5 mg via ORAL
  Filled 2018-06-14 (×2): qty 1

## 2018-06-14 MED ORDER — ALBUTEROL SULFATE (2.5 MG/3ML) 0.083% IN NEBU: 2.5000 mg | INHALATION_SOLUTION | Freq: Four times a day (QID) | RESPIRATORY_TRACT | Status: DC | PRN

## 2018-06-14 MED ORDER — PIPERACILLIN-TAZOBACTAM 3.375 G IVPB 30 MIN: 3.3750 g | Freq: Once | INTRAVENOUS | Status: DC

## 2018-06-14 MED ORDER — POTASSIUM CHLORIDE 10 MEQ/100ML IV SOLN
10.0000 meq | INTRAVENOUS | Status: AC
  Administered 2018-06-14 (×3): 10 meq via INTRAVENOUS
  Filled 2018-06-14 (×2): qty 100

## 2018-06-14 MED ORDER — SODIUM CHLORIDE 0.9 % IV SOLN
INTRAVENOUS | Status: DC | PRN
  Administered 2018-06-14: 1000 mL via INTRAVENOUS

## 2018-06-14 MED ORDER — LETROZOLE 2.5 MG PO TABS
2.5000 mg | ORAL_TABLET | Freq: Every day | ORAL | Status: DC
  Administered 2018-06-14 – 2018-06-15 (×2): 2.5 mg via ORAL
  Filled 2018-06-14 (×3): qty 1

## 2018-06-14 MED ORDER — IOPAMIDOL (ISOVUE-300) INJECTION 61%
100.0000 mL | Freq: Once | INTRAVENOUS | Status: AC | PRN
  Administered 2018-06-14: 100 mL via INTRAVENOUS

## 2018-06-14 MED ORDER — PIPERACILLIN-TAZOBACTAM 3.375 G IVPB 30 MIN
3.3750 g | Freq: Once | INTRAVENOUS | Status: AC
  Administered 2018-06-14: 3.375 g via INTRAVENOUS
  Filled 2018-06-14: qty 50

## 2018-06-14 MED ORDER — ACETAMINOPHEN 650 MG RE SUPP: 650.0000 mg | Freq: Four times a day (QID) | RECTAL | Status: DC | PRN

## 2018-06-14 NOTE — Progress Notes (Signed)
[facility] Surgery Consult Note  Patricia Chandler 29-Aug-1955  [id number].    Requesting MD: Duffy Bruce Chief Complaint/Reason for Consult: diverticulitis  HPI:  Patricia Chandler is a 63yo female who presented to [facility] earlier today after obtaining an outpatient CT scan showing diverticulitis with abscess. States that about 5 days ago she started having severe, sharp, worsening LLQ abdominal pain. Pain waxed/waned for a couple days and then about 24 hours ago her pain nearly resolved. States that she has not really eaten for the last few days because PO intake made her pain worse. Denies nausea, vomiting, fever, chills, diarrhea, constipation. No prior history of diverticulitis. She saw her PCP yesterday who started her on augmentin and then sent her for a CT scan. CT scan shows focal sigmoid diverticulitis with adjacent complex fluid collection measuring 3.3 x 1.7 cm concerning for possible abscess. WBC 9.3, lactic acid 0.8, K 3, blood cultures pending.   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  HENT: Negative.   Eyes: Negative.   Respiratory: Negative.   Cardiovascular: Negative.   Gastrointestinal: Positive for abdominal pain. Negative for constipation, diarrhea, nausea and vomiting.  Genitourinary: Negative.   Musculoskeletal: Negative.   Skin: Negative.   Neurological: Negative.    All systems reviewed and otherwise negative except for as above  Family History  Problem Relation Age of Onset  . Breast cancer Maternal Aunt        dx in her 1s  . Stroke Father        late 61s  . Hypertension Father   . Pulmonary fibrosis Mother   . Breast cancer Paternal Aunt 78  . Cancer Paternal  Grandmother        abdominal; cervical vs uterine dx in her 16s  . Parkinson's disease Maternal Grandfather   . Anesthesia problems Daughter        post-op nausea    Past Medical History:  Diagnosis Date  . Anemia    due to chemo  . Anxiety   . Breast cancer ([place])   . Cardiomyopathy ([place])    mild, per cardiology note; due to Herceptin  . Dental crowns present   . Family history of adverse reaction to anesthesia    pt's daughter has hx. of post-op nausea  . GERD (gastroesophageal reflux disease)   . History of breast cancer 2017   right  . History of chemotherapy    finished chemo 04/15/2016  . Personal history of chemotherapy   . Rash 08/04/2016   under right arm - states recent tick bite; to see MD 08/05/2016  . Runny nose 06/06/2016   clear drainage, per pt.    Past Surgical History:  Procedure Laterality Date  . AUGMENTATION MAMMAPLASTY    . BREAST ENHANCEMENT SURGERY Bilateral 2005  . BREAST RECONSTRUCTION WITH PLACEMENT OF TISSUE EXPANDER AND FLEX HD (ACELLULAR HYDRATED DERMIS) Right 05/19/2016   Procedure: RIGHT BREAST RECONSTRUCTION WITH PLACEMENT OF TISSUE EXPANDER AND  ALLODERM.;  Surgeon: Irene Limbo, MD;  Location: [place];  Service: Plastics;  Laterality: Right;  . COLONOSCOPY    . COLONOSCOPY WITH PROPOFOL  10/02/2014  . DEBRIDEMENT AND CLOSURE WOUND Right 06/08/2016   Procedure: DEBRIDEMENT RIGHT MASTECTOMY FLAP, TISSUE EXPANSION OF RIGHT CHEST;  Surgeon: Irene Limbo, MD;  Location:  [facility];  Service: Plastics;  Laterality: Right;  . MASTECTOMY    . MASTOPEXY Left 08/11/2016   Procedure: LEFT BREAST MASTOPEXY;  Surgeon: Irene Limbo, MD;  Location: [place];  Service: Plastics;  Laterality: Left;  . NIPPLE SPARING MASTECTOMY/SENTINAL LYMPH NODE BIOPSY/RECONSTRUCTION/PLACEMENT OF TISSUE EXPANDER Right 05/19/2016   Procedure: RIGHT NIPPLE SPARING MASTECTOMY WITH SENTINAL LYMPH NODE BIOPSY WITH BLUE DYE INJECTION;   Surgeon: Rolm Bookbinder, MD;  Location: [place];  Service: General;  Laterality: Right;  . PORTACATH PLACEMENT N/A 12/30/2015   Procedure: INSERTION PORT-A-CATH WITH [place];  Surgeon: Rolm Bookbinder, MD;  Location: [place];  Service: General;  Laterality: N/A;  . REDUCTION MAMMAPLASTY    . REMOVAL OF TISSUE EXPANDER AND PLACEMENT OF IMPLANT Right 08/11/2016   Procedure: REMOVAL OF RIGHT  TISSUE EXPANDER AND PLACEMENT OF IMPLANT;  Surgeon: Irene Limbo, MD;  Location: [place];  Service: Plastics;  Laterality: Right;    Social History:  reports that she has never smoked. She has never used smokeless tobacco. She reports current alcohol use. She reports that she does not use drugs.  Allergies: No Known Allergies  (Not in a hospital admission)   Prior to Admission medications   Medication Sig Start Date End Date Taking? Authorizing Provider  albuterol (PROVENTIL HFA;VENTOLIN HFA) 108 (90 Base) MCG/ACT inhaler Inhale 2 puffs into the lungs every 6 (six) hours as needed for wheezing or shortness of breath. 04/01/18   [name], MD  amoxicillin-clavulanate (AUGMENTIN) 875-125 MG tablet Take 1 tablet by mouth 2 (two) times daily. 06/13/18   [name], MD  azithromycin (ZITHROMAX) 250 MG tablet Take 2 tabs on day 1, then 1 tab daily until finished 04/01/18   [name], MD  denosumab (PROLIA) 60 MG/ML SOLN injection Inject 60 mg into the skin every 6 (six) months. Administer in upper arm, thigh, or abdomen    [provider]  letrozole (FEMARA) 2.5 MG tablet Take 1 tablet (2.5 mg total) by mouth daily. 10/19/17   Nicholas Lose, MD  Vitamin D, Ergocalciferol, (DRISDOL) 50000 units CAPS capsule Take 1 capsule (50,000 Units total) by mouth every 7 (seven) days. 12/23/17   Lyndal Pulley, DO  zolpidem (AMBIEN) 5 MG tablet TAKE ONE TABLET BY MOUTH AT BEDTIME AS NEEDED FOR SLEEP 05/10/18   Nicholas Lose, MD   prochlorperazine (COMPAZINE) 10 MG tablet Take 1 tablet (10 mg total) by mouth every 6 (six) hours as needed (Nausea or vomiting). 12/20/15 04/22/16  Nicholas Lose, MD    Blood pressure (!) 139/98, pulse 97, temperature 97.8 F (36.6 C), temperature source Oral, resp. rate 18, height 5\' 3"  (1.6 m), weight 68 kg, SpO2 98 %. Physical Exam: General: pleasant, WD/WN white female who is laying in bed in NAD HEENT: head is normocephalic, atraumatic.  Sclera are noninjected.  Pupils equal and round.  Ears and nose without any masses or lesions.  Mouth is pink and moist. Dentition fair Heart: regular, rate, and rhythm.  No obvious murmurs, gallops, or rubs noted.  Palpable pedal pulses bilaterally Lungs: CTAB, no wheezes, rhonchi, or rales noted.  Respiratory effort nonlabored Abd: incisions, soft, ND, +BS, no masses, hernias, or organomegaly. Mild LLQ TTP without rebound or guarding MS: all 4 extremities are symmetrical with no cyanosis, clubbing, or edema. Skin: warm and dry with no masses, lesions, or rashes Psych: A&Ox3 with an appropriate affect. Neuro: cranial nerves grossly intact, extremity CSM intact bilaterally, normal  speech  Results for orders placed or performed during the hospital encounter of 06/14/18 (from the past 48 hour(s))  CBC with Differential     Status: None   Collection Time: 06/14/18  3:25 PM  Result Value Ref Range   WBC 9.3 4.0 - 10.5 K/uL   RBC 4.83 3.87 - 5.11 MIL/uL   Hemoglobin 14.2 12.0 - 15.0 g/dL   HCT 43.4 36.0 - 46.0 %   MCV 89.9 80.0 - 100.0 fL   MCH 29.4 26.0 - 34.0 pg   MCHC 32.7 30.0 - 36.0 g/dL   RDW 13.0 11.5 - 15.5 %   Platelets 276 150 - 400 K/uL   nRBC 0.0 0.0 - 0.2 %   Neutrophils Relative % 70 %   Neutro Abs 6.5 1.7 - 7.7 K/uL   Lymphocytes Relative 21 %   Lymphs Abs 1.9 0.7 - 4.0 K/uL   Monocytes Relative 6 %   Monocytes Absolute 0.6 0.1 - 1.0 K/uL   Eosinophils Relative 3 %   Eosinophils Absolute 0.3 0.0 - 0.5 K/uL   Basophils Relative 0 %    Basophils Absolute 0.0 0.0 - 0.1 K/uL   Immature Granulocytes 0 %   Abs Immature Granulocytes 0.03 0.00 - 0.07 K/uL    Comment: Performed at [facility], [place] [address], [place], [place] [address]   Ct Abdomen Pelvis W Contrast  Result Date: 06/14/2018 CLINICAL DATA:  Acute left lower quadrant abdominal pain. EXAM: CT ABDOMEN AND PELVIS WITH CONTRAST TECHNIQUE: Multidetector CT imaging of the abdomen and pelvis was performed using the standard protocol following bolus administration of intravenous contrast. CONTRAST:  11mL ISOVUE-300 IOPAMIDOL (ISOVUE-300) INJECTION 61% COMPARISON:  None. FINDINGS: Lower chest: No acute abnormality. Hepatobiliary: No gallstones or biliary dilatation is noted. Left hepatic cyst is noted. 9 mm enhancing abnormality is noted in right hepatic lobe. Pancreas: Unremarkable. No pancreatic ductal dilatation or surrounding inflammatory changes. Spleen: Normal in size without focal abnormality. Adrenals/Urinary Tract: Adrenal glands are unremarkable. Kidneys are normal, without renal calculi, focal lesion, or hydronephrosis. Bladder is unremarkable. Stomach/Bowel: The stomach appears normal. The appendix appears normal. There is no evidence of bowel obstruction. Focal diverticulitis of sigmoid colon is noted. Complex fluid collection is seen adjacent to inflamed segment of colon which measures 3.3 x 1.7 cm and is concerning for possible abscess. Vascular/Lymphatic: No significant vascular findings are present. No enlarged abdominal or pelvic lymph nodes. Reproductive: Uterus and right adnexal regions are unremarkable. Due to previously described sigmoid diverticulitis and probable associated abscess, left ovary is not clearly visualized and most likely is obscured by surrounding inflammation. Other: No hernia is noted. Musculoskeletal: No acute or significant osseous findings. IMPRESSION: Focal sigmoid diverticulitis is noted, with adjacent complex fluid  collection measuring 3.3 x 1.7 cm concerning for possible abscess. These results will be called to the ordering clinician or representative by the Radiologist Assistant, and communication documented in the PACS or zVision Dashboard. 9 mm rounded enhancing abnormality seen in right hepatic lobe which may simply represent hemangioma, but other pathology can not be excluded. Further evaluation with MRI of the liver with and without contrast administration is recommended on nonemergent basis. Electronically Signed   By: Marijo Conception, M.D.   On: 06/14/2018 10:08      Assessment/Plan H/o breast cancer s/p right mastectomy by Dr. [name], and chemotherapy  Sigmoid diverticulitis with abscess - first bout of diverticulitis - Last colonoscopy 2016 showed mild diverticulosis in descending colon - CT scan shows  focal sigmoid diverticulitis with adjacent complex fluid collection measuring 3.3 x 1.7 cm concerning for possible abscess - WBC 9.3, lactic acid 0.8, K 3, blood cultures pending.   ID - zosyn 2/25>> FEN - IVF, CLD, NPO after MN Foley - none Follow up - TBD  Plan - Admit to medicine. Agree with IVF, IV antibiotics. [place] for clear liquids today. Will ask IR to review scan for possibility of drain placement. Hopefully this will improve without surgical intervention. If so, she will need a repeat colonoscopy in 6-8 weeks.  Wellington Hampshire, [facility] Surgery 06/14/2018, 4:02 PM Pager: [phone] Mon-Thurs 7:00 am-4:30 pm Fri 7:00 am -11:30 AM Sat-Sun 7:00 am-11:30 am

## 2018-06-14 NOTE — ED Provider Notes (Signed)
[facility] DEPT Provider Note   CSN: [id number] Arrival date & time: 06/14/18  1208    History   Chief Complaint Chief Complaint  Patient presents with  . abnormal CT    HPI SALLI BODIN is a 63 y.o. female.     HPI   63 yo F with remote h/o breast CA here with abdominal pain. Pt reports 4-5 days of progressively worsening LLQ pain. Pain was severe, sharp, stabbing in LLQ for days until last 24 hours, when she has since begun to feel better. She went to her PCP yesterday, had labs which showed leukocytosis and CT sacn, which was read as concerning for abscess and diverticulitis. No fevers. She has o/w felt well today. She knew she has diverticulosis but has not had diverticulitis before. No h/o abd surgeries, no regular surgeon. No other complaints. Pain is now mild, worse w/ palpation, no alleviating factors in particular. No urinary sx.  Past Medical History:  Diagnosis Date  . Anemia    due to chemo  . Anxiety   . Breast cancer ([place])   . Cardiomyopathy ([place])    mild, per cardiology note; due to Herceptin  . Dental crowns present   . Family history of adverse reaction to anesthesia    pt's daughter has hx. of post-op nausea  . GERD (gastroesophageal reflux disease)   . History of breast cancer 2017   right  . History of chemotherapy    finished chemo 04/15/2016  . Personal history of chemotherapy   . Rash 08/04/2016   under right arm - states recent tick bite; to see MD 08/05/2016  . Runny nose 06/06/2016   clear drainage, per pt.    Patient Active Problem List   Diagnosis Date Noted  . Acute bursitis of right shoulder 06/08/2017  . Acute shoulder bursitis, left 03/08/2017  . DDD (degenerative disc disease), cervical 10/08/2016  . Nonallopathic lesion of cervical region 10/08/2016  . Nonallopathic lesion of thoracic region 10/08/2016  . Nonallopathic lesion of lumbosacral region 10/08/2016  . Chemotherapy-induced  thrombocytopenia 03/30/2016  . Genetic testing 01/10/2016  . Port catheter in place 01/08/2016  . History of sepsis 01/08/2016  . GERD (gastroesophageal reflux disease) 01/08/2016  . Breast cancer of upper-inner quadrant of right female breast ([place]) 12/18/2015  . Family history of breast cancer   . Internal hemorrhoid 01/26/2011    Past Surgical History:  Procedure Laterality Date  . AUGMENTATION MAMMAPLASTY    . BREAST ENHANCEMENT SURGERY Bilateral 2005  . BREAST RECONSTRUCTION WITH PLACEMENT OF TISSUE EXPANDER AND FLEX HD (ACELLULAR HYDRATED DERMIS) Right 05/19/2016   Procedure: RIGHT BREAST RECONSTRUCTION WITH PLACEMENT OF TISSUE EXPANDER AND  ALLODERM.;  Surgeon: Irene Limbo, MD;  Location: [facility];  Service: Plastics;  Laterality: Right;  . COLONOSCOPY    . COLONOSCOPY WITH PROPOFOL  10/02/2014  . DEBRIDEMENT AND CLOSURE WOUND Right 06/08/2016   Procedure: DEBRIDEMENT RIGHT MASTECTOMY FLAP, TISSUE EXPANSION OF RIGHT CHEST;  Surgeon: Irene Limbo, MD;  Location: [facility];  Service: Plastics;  Laterality: Right;  . MASTECTOMY    . MASTOPEXY Left 08/11/2016   Procedure: LEFT BREAST MASTOPEXY;  Surgeon: Irene Limbo, MD;  Location: [facility];  Service: Plastics;  Laterality: Left;  . NIPPLE SPARING MASTECTOMY/SENTINAL LYMPH NODE BIOPSY/RECONSTRUCTION/PLACEMENT OF TISSUE EXPANDER Right 05/19/2016   Procedure: RIGHT NIPPLE SPARING MASTECTOMY WITH SENTINAL LYMPH NODE BIOPSY WITH BLUE DYE INJECTION;  Surgeon: Rolm Bookbinder, MD;  Location: [facility];  Service: General;  Laterality: Right;  . PORTACATH PLACEMENT N/A 12/30/2015   Procedure: INSERTION PORT-A-CATH WITH [place];  Surgeon: Rolm Bookbinder, MD;  Location: [facility];  Service: General;  Laterality: N/A;  . REDUCTION MAMMAPLASTY    . REMOVAL OF TISSUE EXPANDER AND PLACEMENT OF IMPLANT Right 08/11/2016   Procedure: REMOVAL OF RIGHT  TISSUE EXPANDER AND PLACEMENT OF  IMPLANT;  Surgeon: Irene Limbo, MD;  Location: [facility];  Service: Plastics;  Laterality: Right;     OB History   No obstetric history on file.      Home Medications    Prior to Admission medications   Medication Sig Start Date End Date Taking? Authorizing Provider  amoxicillin-clavulanate (AUGMENTIN) 875-125 MG tablet Take 1 tablet by mouth 2 (two) times daily. 06/13/18  Yes [name], MD  Cyanocobalamin (VITAMIN B-12 PO) Take 1 tablet by mouth daily.   Yes [provider]  denosumab (PROLIA) 60 MG/ML SOLN injection Inject 60 mg into the skin every 6 (six) months. Administer in upper arm, thigh, or abdomen   Yes [provider]  letrozole (FEMARA) 2.5 MG tablet Take 1 tablet (2.5 mg total) by mouth daily. 10/19/17  Yes Nicholas Lose, MD  Misc Natural Products (TURMERIC CURCUMIN) CAPS Take 1 capsule by mouth daily.   Yes [provider]  Vitamin D, Ergocalciferol, (DRISDOL) 50000 units CAPS capsule Take 1 capsule (50,000 Units total) by mouth every 7 (seven) days. 12/23/17  Yes Lyndal Pulley, DO  zolpidem (AMBIEN) 5 MG tablet TAKE ONE TABLET BY MOUTH AT BEDTIME AS NEEDED FOR SLEEP Patient taking differently: Take 2.5 mg by mouth at bedtime as needed for sleep.  05/10/18  Yes Nicholas Lose, MD  albuterol (PROVENTIL HFA;VENTOLIN HFA) 108 (90 Base) MCG/ACT inhaler Inhale 2 puffs into the lungs every 6 (six) hours as needed for wheezing or shortness of breath. 04/01/18   [name], MD  azithromycin (ZITHROMAX) 250 MG tablet Take 2 tabs on day 1, then 1 tab daily until finished 04/01/18   [name], MD  prochlorperazine (COMPAZINE) 10 MG tablet Take 1 tablet (10 mg total) by mouth every 6 (six) hours as needed (Nausea or vomiting). 12/20/15 04/22/16  Nicholas Lose, MD    Family History Family History  Problem Relation Age of Onset  . Breast cancer Maternal Aunt        dx in her 55s  . Stroke Father        late 41s  .  Hypertension Father   . Pulmonary fibrosis Mother   . Breast cancer Paternal Aunt 78  . Cancer Paternal Grandmother        abdominal; cervical vs uterine dx in her 59s  . Parkinson's disease Maternal Grandfather   . Anesthesia problems Daughter        post-op nausea    Social History Social History   Tobacco Use  . Smoking status: Never Smoker  . Smokeless tobacco: Never Used  Substance Use Topics  . Alcohol use: Yes    Comment: occasionally  . Drug use: No     Allergies   Patient has no known allergies.   Review of Systems Review of Systems  Constitutional: Positive for fatigue. Negative for chills and fever.  HENT: Negative for congestion and rhinorrhea.   Eyes: Negative for visual disturbance.  Respiratory: Negative for cough, shortness of breath and wheezing.   Cardiovascular: Negative for chest pain and leg swelling.  Gastrointestinal: Positive for abdominal  pain. Negative for diarrhea, nausea and vomiting.  Genitourinary: Negative for dysuria and flank pain.  Musculoskeletal: Negative for neck pain and neck stiffness.  Skin: Negative for rash and wound.  Allergic/Immunologic: Negative for immunocompromised state.  Neurological: Negative for syncope, weakness and headaches.  All other systems reviewed and are negative.    Physical Exam Updated Vital Signs BP (!) 139/98   Pulse 97   Temp 97.8 F (36.6 C) (Oral)   Resp 18   Ht 5\' 3"  (1.6 m)   Wt 68 kg   SpO2 98%   BMI 26.57 kg/m   Physical Exam Vitals signs and nursing note reviewed.  Constitutional:      General: She is not in acute distress.    Appearance: She is well-developed.  HENT:     Head: Normocephalic and atraumatic.  Eyes:     Conjunctiva/sclera: Conjunctivae normal.  Neck:     Musculoskeletal: Neck supple.  Cardiovascular:     Rate and Rhythm: Normal rate and regular rhythm.     Heart sounds: Normal heart sounds. No murmur. No friction rub.  Pulmonary:     Effort: Pulmonary effort  is normal. No respiratory distress.     Breath sounds: Normal breath sounds. No wheezing or rales.  Abdominal:     General: There is no distension.     Palpations: Abdomen is soft.     Tenderness: There is no abdominal tenderness.     Comments: Mild LLQ TTP, with rebound. No guarding.  Skin:    General: Skin is warm.     Capillary Refill: Capillary refill takes less than 2 seconds.  Neurological:     Mental Status: She is alert and oriented to person, place, and time.     Motor: No abnormal muscle tone.      ED Treatments / Results  Labs (all labs ordered are listed, but only abnormal results are displayed) Labs Reviewed  COMPREHENSIVE METABOLIC PANEL - Abnormal; Notable for the following components:      Result Value   Potassium 3.0 (*)    Calcium 8.6 (*)    All other components within normal limits  CULTURE, BLOOD (ROUTINE X 2)  CULTURE, BLOOD (ROUTINE X 2)  CBC WITH DIFFERENTIAL/PLATELET  LACTIC ACID, PLASMA  LACTIC ACID, PLASMA    EKG None  Radiology Ct Abdomen Pelvis W Contrast  Result Date: 06/14/2018 CLINICAL DATA:  Acute left lower quadrant abdominal pain. EXAM: CT ABDOMEN AND PELVIS WITH CONTRAST TECHNIQUE: Multidetector CT imaging of the abdomen and pelvis was performed using the standard protocol following bolus administration of intravenous contrast. CONTRAST:  171mL ISOVUE-300 IOPAMIDOL (ISOVUE-300) INJECTION 61% COMPARISON:  None. FINDINGS: Lower chest: No acute abnormality. Hepatobiliary: No gallstones or biliary dilatation is noted. Left hepatic cyst is noted. 9 mm enhancing abnormality is noted in right hepatic lobe. Pancreas: Unremarkable. No pancreatic ductal dilatation or surrounding inflammatory changes. Spleen: Normal in size without focal abnormality. Adrenals/Urinary Tract: Adrenal glands are unremarkable. Kidneys are normal, without renal calculi, focal lesion, or hydronephrosis. Bladder is unremarkable. Stomach/Bowel: The stomach appears normal. The  appendix appears normal. There is no evidence of bowel obstruction. Focal diverticulitis of sigmoid colon is noted. Complex fluid collection is seen adjacent to inflamed segment of colon which measures 3.3 x 1.7 cm and is concerning for possible abscess. Vascular/Lymphatic: No significant vascular findings are present. No enlarged abdominal or pelvic lymph nodes. Reproductive: Uterus and right adnexal regions are unremarkable. Due to previously described sigmoid diverticulitis and probable associated abscess,  left ovary is not clearly visualized and most likely is obscured by surrounding inflammation. Other: No hernia is noted. Musculoskeletal: No acute or significant osseous findings. IMPRESSION: Focal sigmoid diverticulitis is noted, with adjacent complex fluid collection measuring 3.3 x 1.7 cm concerning for possible abscess. These results will be called to the ordering clinician or representative by the Radiologist Assistant, and communication documented in the PACS or zVision Dashboard. 9 mm rounded enhancing abnormality seen in right hepatic lobe which may simply represent hemangioma, but other pathology can not be excluded. Further evaluation with MRI of the liver with and without contrast administration is recommended on nonemergent basis. Electronically Signed   By: Marijo Conception, M.D.   On: 06/14/2018 10:08    Procedures .Critical Care Performed by: Duffy Bruce, MD Authorized by: Duffy Bruce, MD   Critical care provider statement:    Critical care time (minutes):  35   Critical care time was exclusive of:  Separately billable procedures and treating other patients and teaching time   Critical care was necessary to treat or prevent imminent or life-threatening deterioration of the following conditions:  Circulatory failure, cardiac failure and sepsis   Critical care was time spent personally by me on the following activities:  Development of treatment plan with patient or surrogate,  discussions with consultants, evaluation of patient's response to treatment, examination of patient, obtaining history from patient or surrogate, ordering and performing treatments and interventions, ordering and review of laboratory studies, ordering and review of radiographic studies, pulse oximetry, re-evaluation of patient's condition and review of old charts   I assumed direction of critical care for this patient from another provider in my specialty: no     (including critical care time)  Medications Ordered in ED Medications  0.9 %  sodium chloride infusion (has no administration in time range)  0.9 %  sodium chloride infusion (1,000 mLs Intravenous New Bag/Given 06/14/18 1540)  potassium chloride 10 mEq in 100 mL IVPB (has no administration in time range)  sodium chloride 0.9 % bolus 1,000 mL (1,000 mLs Intravenous New Bag/Given 06/14/18 1530)  piperacillin-tazobactam (ZOSYN) IVPB 3.375 g (3.375 g Intravenous New Bag/Given 06/14/18 1542)     Initial Impression / Assessment and Plan / ED Course  I have reviewed the triage vital signs and the nursing notes.  Pertinent labs & imaging results that were available during my care of the patient were reviewed by me and considered in my medical decision making (see chart for details).  Clinical Course as of Jun 14 1624  Tue Jun 14, 2018  1624 63 yo F here with perforated diverticulitis. Localized abscess on CT. No fever or signs of generalized peritonitis. Potassium replaced, will admit. IVF, Zosyn given. Surgery consulted, will see pt and recommend medicine admission.   [CI]    Clinical Course User Index [CI] Duffy Bruce, MD       Final Clinical Impressions(s) / ED Diagnoses   Final diagnoses:  Perforated diverticulum  Acute diverticulitis    ED Discharge Orders    None       Duffy Bruce, MD 06/14/18 1626

## 2018-06-14 NOTE — ED Notes (Signed)
ED TO INPATIENT HANDOFF REPORT  Name/Age/Gender Patricia Chandler 63 y.o. female  Code Status Code Status History    Date Active Date Inactive Code Status Order ID Comments User Context   05/19/2016 1310 05/20/2016 1205 Full Code [id number]  Rolm Bookbinder, MD Inpatient   01/08/2016 2002 01/12/2016 1653 Full Code [id number]  Ivor Costa, MD Inpatient      Home/SNF/Other Home  Chief Complaint Sent from Dr; Diverticulitis  Level of Care/Admitting Diagnosis ED Disposition    ED Disposition Condition [place]: [facility] [[id number]]  Level of Care: Med-Surg [16]  Diagnosis: Intra-abdominal abscess [facility]) [[id number]]  Admitting Physician: [place] ERIC J [[id number]]  Attending Physician: [place] ERIC J [[id number]]  Estimated length of stay: past midnight tomorrow  Certification:: I certify this patient will need inpatient services for at least 2 midnights  PT Class (Do Not Modify): Inpatient [101]  PT Acc Code (Do Not Modify): Private [1]       Medical History Past Medical History:  Diagnosis Date  . Anemia    due to chemo  . Anxiety   . Breast cancer ([place])   . Cardiomyopathy ([place])    mild, per cardiology note; due to Herceptin  . Dental crowns present   . Family history of adverse reaction to anesthesia    pt's daughter has hx. of post-op nausea  . GERD (gastroesophageal reflux disease)   . History of breast cancer 2017   right  . History of chemotherapy    finished chemo 04/15/2016  . Personal history of chemotherapy   . Rash 08/04/2016   under right arm - states recent tick bite; to see MD 08/05/2016  . Runny nose 06/06/2016   clear drainage, per pt.    Allergies No Known Allergies  IV Location/Drains/Wounds Patient Lines/Drains/Airways Status   Active Line/Drains/Airways    Name:   Placement date:   Placement time:   Site:   Days:   Implanted Port 12/30/15 Right Chest   12/30/15    0000    Chest   897   Peripheral IV  06/14/18 Left Antecubital   06/14/18    1528    Antecubital   less than 1   Post Cath / Sheath 12/30/15 Right Venous   12/30/15    1535    Venous   897   Closed System Drain 1 Right Chest Bulb (JP) 19 Fr.   05/19/16    1118    Chest   756   Incision (Closed) 12/30/15 Chest Right   12/30/15    1550     897   Incision (Closed) 12/30/15 Neck Right   12/30/15    1553     897   Incision (Closed) 05/19/16 Breast Right   05/19/16    1143     756   Incision (Closed) 06/08/16 Breast Right   06/08/16    0742     736   Incision (Closed) 08/11/16 Breast Right   08/11/16    0802     672   Incision (Closed) 08/11/16 Breast Left   08/11/16    0802     672          Labs/Imaging Results for orders placed or performed during the hospital encounter of 06/14/18 (from the past 48 hour(s))  CBC with Differential     Status: None   Collection Time: 06/14/18  3:25 PM  Result Value Ref Range   WBC 9.3 4.0 -  10.5 K/uL   RBC 4.83 3.87 - 5.11 MIL/uL   Hemoglobin 14.2 12.0 - 15.0 g/dL   HCT 43.4 36.0 - 46.0 %   MCV 89.9 80.0 - 100.0 fL   MCH 29.4 26.0 - 34.0 pg   MCHC 32.7 30.0 - 36.0 g/dL   RDW 13.0 11.5 - 15.5 %   Platelets 276 150 - 400 K/uL   nRBC 0.0 0.0 - 0.2 %   Neutrophils Relative % 70 %   Neutro Abs 6.5 1.7 - 7.7 K/uL   Lymphocytes Relative 21 %   Lymphs Abs 1.9 0.7 - 4.0 K/uL   Monocytes Relative 6 %   Monocytes Absolute 0.6 0.1 - 1.0 K/uL   Eosinophils Relative 3 %   Eosinophils Absolute 0.3 0.0 - 0.5 K/uL   Basophils Relative 0 %   Basophils Absolute 0.0 0.0 - 0.1 K/uL   Immature Granulocytes 0 %   Abs Immature Granulocytes 0.03 0.00 - 0.07 K/uL    Comment: Performed at [facility], [place] [address] [place], [place] [address]  Comprehensive metabolic panel     Status: Abnormal   Collection Time: 06/14/18  3:25 PM  Result Value Ref Range   Sodium 136 135 - 145 mmol/L   Potassium 3.0 (L) 3.5 - 5.1 mmol/L   Chloride 102 98 - 111 mmol/L   CO2 26 22 - 32 mmol/L    Glucose, Bld 97 70 - 99 mg/dL   BUN 10 8 - 23 mg/dL   Creatinine, Ser 0.76 0.44 - 1.00 mg/dL   Calcium 8.6 (L) 8.9 - 10.3 mg/dL   Total Protein 7.6 6.5 - 8.1 g/dL   Albumin 4.3 3.5 - 5.0 g/dL   AST 20 15 - 41 U/L   ALT 15 0 - 44 U/L   Alkaline Phosphatase 64 38 - 126 U/L   Total Bilirubin 0.8 0.3 - 1.2 mg/dL   GFR calc non Af Amer >60 >60 mL/min   GFR calc Af Amer >60 >60 mL/min   Anion gap 8 5 - 15    Comment: Performed at [facility], [place] [address], [place], [place] [address]  Lactic acid, plasma     Status: None   Collection Time: 06/14/18  3:25 PM  Result Value Ref Range   Lactic Acid, Venous 0.8 0.5 - 1.9 mmol/L    Comment: Performed at [facility], [place] [address] [place], [place] [address]   Ct Abdomen Pelvis W Contrast  Result Date: 06/14/2018 CLINICAL DATA:  Acute left lower quadrant abdominal pain. EXAM: CT ABDOMEN AND PELVIS WITH CONTRAST TECHNIQUE: Multidetector CT imaging of the abdomen and pelvis was performed using the standard protocol following bolus administration of intravenous contrast. CONTRAST:  160mL ISOVUE-300 IOPAMIDOL (ISOVUE-300) INJECTION 61% COMPARISON:  None. FINDINGS: Lower chest: No acute abnormality. Hepatobiliary: No gallstones or biliary dilatation is noted. Left hepatic cyst is noted. 9 mm enhancing abnormality is noted in right hepatic lobe. Pancreas: Unremarkable. No pancreatic ductal dilatation or surrounding inflammatory changes. Spleen: Normal in size without focal abnormality. Adrenals/Urinary Tract: Adrenal glands are unremarkable. Kidneys are normal, without renal calculi, focal lesion, or hydronephrosis. Bladder is unremarkable. Stomach/Bowel: The stomach appears normal. The appendix appears normal. There is no evidence of bowel obstruction. Focal diverticulitis of sigmoid colon is noted. Complex fluid collection is seen adjacent to inflamed segment of colon which measures 3.3 x 1.7 cm and is concerning for  possible abscess. Vascular/Lymphatic: No significant vascular findings are present. No enlarged  abdominal or pelvic lymph nodes. Reproductive: Uterus and right adnexal regions are unremarkable. Due to previously described sigmoid diverticulitis and probable associated abscess, left ovary is not clearly visualized and most likely is obscured by surrounding inflammation. Other: No hernia is noted. Musculoskeletal: No acute or significant osseous findings. IMPRESSION: Focal sigmoid diverticulitis is noted, with adjacent complex fluid collection measuring 3.3 x 1.7 cm concerning for possible abscess. These results will be called to the ordering clinician or representative by the Radiologist Assistant, and communication documented in the PACS or zVision Dashboard. 9 mm rounded enhancing abnormality seen in right hepatic lobe which may simply represent hemangioma, but other pathology can not be excluded. Further evaluation with MRI of the liver with and without contrast administration is recommended on nonemergent basis. Electronically Signed   By: Marijo Conception, M.D.   On: 06/14/2018 10:08    Pending Labs Unresulted Labs (From admission, onward)    Start     Ordered   06/14/18 1511  Lactic acid, plasma  Now then every 2 hours,   STAT     06/14/18 1511   06/14/18 1511  Blood culture (routine x 2)  BLOOD CULTURE X 2,   STAT     06/14/18 1511   Signed and Held  Comprehensive metabolic panel  Tomorrow morning,   R     Signed and Held   Signed and Held  CBC  Tomorrow morning,   R     Signed and Held   Signed and Held  Protime-INR  Tomorrow morning,   R     Signed and Held          Vitals/Pain Today's Vitals   06/14/18 1229 06/14/18 1444 06/14/18 1752  BP: 117/89 (!) 139/98 (!) 119/91  Pulse: (!) 109 97 91  Resp: 15 18 18   Temp: 97.8 F (36.6 C)    TempSrc: Oral    SpO2: 98% 98% 99%  Weight: 68 kg    Height: 5\' 3"  (1.6 m)    PainSc: 0-No pain      Isolation Precautions No active  isolations  Medications Medications  0.9 %  sodium chloride infusion (1,000 mLs Intravenous New Bag/Given 06/14/18 1540)  potassium chloride 10 mEq in 100 mL IVPB (0 mEq Intravenous Stopped 06/14/18 1758)  piperacillin-tazobactam (ZOSYN) IVPB 3.375 g (has no administration in time range)  piperacillin-tazobactam (ZOSYN) IVPB 3.375 g (has no administration in time range)  0.9 %  sodium chloride infusion ( Intravenous New Bag/Given 06/14/18 1652)  sodium chloride 0.9 % bolus 1,000 mL (0 mLs Intravenous Stopped 06/14/18 1646)  piperacillin-tazobactam (ZOSYN) IVPB 3.375 g (0 g Intravenous Stopped 06/14/18 1632)    Mobility walks

## 2018-06-14 NOTE — Telephone Encounter (Signed)
PEC called and stated that the pt had diverticulitis and a possible abscess. Message verbally relayed to Dr. Jerline Pain in the absence of Dr. Yong Channel today.   Routing message to Dr. Jerline Pain for clarification and advise

## 2018-06-14 NOTE — Progress Notes (Signed)
Patient arrived from ED. Ambulatory in room. In no acute distress. [name], RN

## 2018-06-14 NOTE — Telephone Encounter (Signed)
Jane with [facility] called in with a CT scan report for Dr. Yong Channel at [facility] office. CT scan of abd and pelvis with a possible abscess noted.  I called the flow coordinator, Tillie Rung at the [place] office and made her aware of the results.

## 2018-06-14 NOTE — H&P (Signed)
History and Physical    Patricia Chandler OXB:[id number] DOB: [birth year] DOA: 06/14/2018  PCP: [name], MD  Patient coming from: Home  I have personally briefly reviewed patient's old medical records in [place]  Chief Complaint: Abdominal pain  HPI: Patricia Chandler is a 63 y.o. female with medical history significant of breast cancer with completion of chemotherapy in 2017 and history of diverticulosis who presents to [facility] long ED by direction of PCP for concerning CT scan findings.  Patient reports onset of abdominal pain roughly 5 days ago that was severe and diffuse.  During the interval course, patient's symptoms have waxed and waned with more localized findings to bilateral lower quadrants.  During this timeframe she has had increasing nausea with decreased appetite.  She reports no changes in her home medication regimen or no sick contacts.  Evaluated by PCP yesterday who started her on Augmentin for concerns of diverticulitis.  A CT scan abdomen/pelvis was ordered that was notable for sigmoid diverticulitis with an adjacent complex fluid collection measuring 3.3 x 1.7 cm concerning for possible abscess; and she was directed by her PCP to proceed to the ED for further evaluation and treatment.  Patient currently without any significant complaints, states abdominal pain has resolved.  She further denies headaches, no fever/chills/night sweats, no current nausea/vomiting/diarrhea, no cough/congestion, no chest pain, no palpitations, no issues with bowel/bladder function, no sore throat, and no paresthesias.  ED Course: Temperature 97.8, HR 97, RR 18, BP 139/98, oxygenating 100% on room air.  Sodium 136, potassium 3.0, creatinine 0.76, glucose 27.  WBC count 9.3, hemoglobin 14.2.  CT abdomen/pelvis notable for sigmoid diverticulitis with a 3.3 x 1.7 cm fluid collection concerning for possible abscess.  Also noted a 9 mm rounded enhancing abnormality right hepatic lobe  could be consistent with a hemangioma but recommend further imaging with MRI if concern.  General surgery was consulted for evaluation given her intra-abdominal abscess, and requested hospitalist admission for acute diverticulitis with likely abscess formation.  Review of Systems: As per HPI otherwise 10 point review of systems negative.   Past Medical History:  Diagnosis Date  . Anemia    due to chemo  . Anxiety   . Breast cancer ([place])   . Cardiomyopathy ([place])    mild, per cardiology note; due to Herceptin  . Dental crowns present   . Family history of adverse reaction to anesthesia    pt's daughter has hx. of post-op nausea  . GERD (gastroesophageal reflux disease)   . History of breast cancer 2017   right  . History of chemotherapy    finished chemo 04/15/2016  . Personal history of chemotherapy   . Rash 08/04/2016   under right arm - states recent tick bite; to see MD 08/05/2016  . Runny nose 06/06/2016   clear drainage, per pt.    Past Surgical History:  Procedure Laterality Date  . AUGMENTATION MAMMAPLASTY    . BREAST ENHANCEMENT SURGERY Bilateral 2005  . BREAST RECONSTRUCTION WITH PLACEMENT OF TISSUE EXPANDER AND FLEX HD (ACELLULAR HYDRATED DERMIS) Right 05/19/2016   Procedure: RIGHT BREAST RECONSTRUCTION WITH PLACEMENT OF TISSUE EXPANDER AND  ALLODERM.;  Surgeon: Irene Limbo, MD;  Location: [facility];  Service: Plastics;  Laterality: Right;  . COLONOSCOPY    . COLONOSCOPY WITH PROPOFOL  10/02/2014  . DEBRIDEMENT AND CLOSURE WOUND Right 06/08/2016   Procedure: DEBRIDEMENT RIGHT MASTECTOMY FLAP, TISSUE EXPANSION OF RIGHT CHEST;  Surgeon: Irene Limbo, MD;  Location:  [place] [facility] [facility];  Service: Plastics;  Laterality: Right;  . MASTECTOMY    . MASTOPEXY Left 08/11/2016   Procedure: LEFT BREAST MASTOPEXY;  Surgeon: Irene Limbo, MD;  Location: [facility];  Service: Plastics;  Laterality: Left;  . NIPPLE SPARING MASTECTOMY/SENTINAL LYMPH NODE  BIOPSY/RECONSTRUCTION/PLACEMENT OF TISSUE EXPANDER Right 05/19/2016   Procedure: RIGHT NIPPLE SPARING MASTECTOMY WITH SENTINAL LYMPH NODE BIOPSY WITH BLUE DYE INJECTION;  Surgeon: Rolm Bookbinder, MD;  Location: [facility];  Service: General;  Laterality: Right;  . PORTACATH PLACEMENT N/A 12/30/2015   Procedure: INSERTION PORT-A-CATH WITH [place];  Surgeon: Rolm Bookbinder, MD;  Location: [place];  Service: General;  Laterality: N/A;  . REDUCTION MAMMAPLASTY    . REMOVAL OF TISSUE EXPANDER AND PLACEMENT OF IMPLANT Right 08/11/2016   Procedure: REMOVAL OF RIGHT  TISSUE EXPANDER AND PLACEMENT OF IMPLANT;  Surgeon: Irene Limbo, MD;  Location: [place];  Service: Plastics;  Laterality: Right;     reports that she has never smoked. She has never used smokeless tobacco. She reports current alcohol use. She reports that she does not use drugs.  No Known Allergies  Family History  Problem Relation Age of Onset  . Breast cancer Maternal Aunt        dx in her 13s  . Stroke Father        late 35s  . Hypertension Father   . Pulmonary fibrosis Mother   . Breast cancer Paternal Aunt 78  . Cancer Paternal Grandmother        abdominal; cervical vs uterine dx in her 58s  . Parkinson's disease Maternal Grandfather   . Anesthesia problems Daughter        post-op nausea    Prior to Admission medications   Medication Sig Start Date End Date Taking? Authorizing Provider  amoxicillin-clavulanate (AUGMENTIN) 875-125 MG tablet Take 1 tablet by mouth 2 (two) times daily. 06/13/18  Yes [name], MD  Cyanocobalamin (VITAMIN B-12 PO) Take 1 tablet by mouth daily.   Yes [provider]  denosumab (PROLIA) 60 MG/ML SOLN injection Inject 60 mg into the skin every 6 (six) months. Administer in upper arm, thigh, or abdomen   Yes [provider]  letrozole (FEMARA) 2.5 MG tablet Take 1 tablet (2.5 mg total) by mouth daily. 10/19/17  Yes  Nicholas Lose, MD  Misc Natural Products (TURMERIC CURCUMIN) CAPS Take 1 capsule by mouth daily.   Yes [provider]  Vitamin D, Ergocalciferol, (DRISDOL) 50000 units CAPS capsule Take 1 capsule (50,000 Units total) by mouth every 7 (seven) days. 12/23/17  Yes Lyndal Pulley, DO  zolpidem (AMBIEN) 5 MG tablet TAKE ONE TABLET BY MOUTH AT BEDTIME AS NEEDED FOR SLEEP Patient taking differently: Take 2.5 mg by mouth at bedtime as needed for sleep.  05/10/18  Yes Nicholas Lose, MD  albuterol (PROVENTIL HFA;VENTOLIN HFA) 108 (90 Base) MCG/ACT inhaler Inhale 2 puffs into the lungs every 6 (six) hours as needed for wheezing or shortness of breath. 04/01/18   [name], MD  azithromycin (ZITHROMAX) 250 MG tablet Take 2 tabs on day 1, then 1 tab daily until finished 04/01/18   [name], MD  prochlorperazine (COMPAZINE) 10 MG tablet Take 1 tablet (10 mg total) by mouth every 6 (six) hours as needed (Nausea or vomiting). 12/20/15 04/22/16  Nicholas Lose, MD    Physical Exam: Vitals:   06/14/18 1229 06/14/18 1444 06/14/18 1752 06/14/18 1842  BP: 117/89 Marland Kitchen)  139/98 (!) 119/91 116/83  Pulse: (!) 109 97 91 89  Resp: 15 18 18 18   Temp: 97.8 F (36.6 C)   98.1 F (36.7 C)  TempSrc: Oral   Oral  SpO2: 98% 98% 99% 98%  Weight: 68 kg     Height: 5\' 3"  (1.6 m)       Constitutional: NAD, calm, comfortable Vitals:   06/14/18 1229 06/14/18 1444 06/14/18 1752 06/14/18 1842  BP: 117/89 (!) 139/98 (!) 119/91 116/83  Pulse: (!) 109 97 91 89  Resp: 15 18 18 18   Temp: 97.8 F (36.6 C)   98.1 F (36.7 C)  TempSrc: Oral   Oral  SpO2: 98% 98% 99% 98%  Weight: 68 kg     Height: 5\' 3"  (1.6 m)      Eyes: PERRL, lids and conjunctivae normal ENMT: Mucous membranes are moist. Posterior pharynx clear of any exudate or lesions.Normal dentition.  Neck: normal, supple, no masses, no thyromegaly Respiratory: clear to auscultation bilaterally, no wheezing, no crackles. Normal respiratory effort. No  accessory muscle use.  Cardiovascular: Regular rate and rhythm, no murmurs / rubs / gallops. No extremity edema. 2+ pedal pulses. No carotid bruits.  Abdomen: no tenderness, no masses palpated. No hepatosplenomegaly. Bowel sounds positive.  Musculoskeletal: no clubbing / cyanosis. No joint deformity upper and lower extremities. Good ROM, no contractures. Normal muscle tone.  Skin: no rashes, lesions, ulcers. No induration Neurologic: CN 2-12 grossly intact. Sensation intact, DTR normal. Strength 5/5 in all 4.  Psychiatric: Normal judgment and insight. Alert and oriented x 3. Normal mood.     Labs on Admission: I have personally reviewed following labs and imaging studies  CBC: Recent Labs  Lab 06/13/18 1522 06/14/18 1525  WBC 11.8* 9.3  NEUTROABS 8.3* 6.5  HGB 14.3 14.2  HCT 42.6 43.4  MCV 88.6 89.9  PLT 266.0 149   Basic Metabolic Panel: Recent Labs  Lab 06/13/18 1522 06/14/18 1525  NA 136 136  K 4.1 3.0*  CL 99 102  CO2 26 26  GLUCOSE 81 97  BUN 13 10  CREATININE 0.74 0.76  CALCIUM 9.5 8.6*   GFR: Estimated Creatinine Clearance: 64.9 mL/min (by C-G formula based on SCr of 0.76 mg/dL). Liver Function Tests: Recent Labs  Lab 06/13/18 1522 06/14/18 1525  AST 15 20  ALT 11 15  ALKPHOS 71 64  BILITOT 0.6 0.8  PROT 6.9 7.6  ALBUMIN 4.5 4.3   No results for input(s): LIPASE, AMYLASE in the last 168 hours. No results for input(s): AMMONIA in the last 168 hours. Coagulation Profile: No results for input(s): INR, PROTIME in the last 168 hours. Cardiac Enzymes: No results for input(s): CKTOTAL, CKMB, CKMBINDEX, TROPONINI in the last 168 hours. BNP (last 3 results) No results for input(s): PROBNP in the last 8760 hours. HbA1C: No results for input(s): HGBA1C in the last 72 hours. CBG: No results for input(s): GLUCAP in the last 168 hours. Lipid Profile: No results for input(s): CHOL, HDL, LDLCALC, TRIG, CHOLHDL, LDLDIRECT in the last 72 hours. Thyroid Function  Tests: No results for input(s): TSH, T4TOTAL, FREET4, T3FREE, THYROIDAB in the last 72 hours. Anemia Panel: No results for input(s): VITAMINB12, FOLATE, FERRITIN, TIBC, IRON, RETICCTPCT in the last 72 hours. Urine analysis:    Component Value Date/Time   LABSPEC 1.010 01/08/2016 1401   PHURINE 6.5 01/08/2016 1401   GLUCOSEU Negative 01/08/2016 1401   HGBUR Negative 01/08/2016 1401   BILIRUBINUR Negative 01/08/2016 1401   KETONESUR 15 01/08/2016  1401   PROTEINUR Negative 01/08/2016 1401   UROBILINOGEN 0.2 01/08/2016 1401   NITRITE Negative 01/08/2016 1401   LEUKOCYTESUR Negative 01/08/2016 1401    Radiological Exams on Admission: Ct Abdomen Pelvis W Contrast  Result Date: 06/14/2018 CLINICAL DATA:  Acute left lower quadrant abdominal pain. EXAM: CT ABDOMEN AND PELVIS WITH CONTRAST TECHNIQUE: Multidetector CT imaging of the abdomen and pelvis was performed using the standard protocol following bolus administration of intravenous contrast. CONTRAST:  136mL ISOVUE-300 IOPAMIDOL (ISOVUE-300) INJECTION 61% COMPARISON:  None. FINDINGS: Lower chest: No acute abnormality. Hepatobiliary: No gallstones or biliary dilatation is noted. Left hepatic cyst is noted. 9 mm enhancing abnormality is noted in right hepatic lobe. Pancreas: Unremarkable. No pancreatic ductal dilatation or surrounding inflammatory changes. Spleen: Normal in size without focal abnormality. Adrenals/Urinary Tract: Adrenal glands are unremarkable. Kidneys are normal, without renal calculi, focal lesion, or hydronephrosis. Bladder is unremarkable. Stomach/Bowel: The stomach appears normal. The appendix appears normal. There is no evidence of bowel obstruction. Focal diverticulitis of sigmoid colon is noted. Complex fluid collection is seen adjacent to inflamed segment of colon which measures 3.3 x 1.7 cm and is concerning for possible abscess. Vascular/Lymphatic: No significant vascular findings are present. No enlarged abdominal or  pelvic lymph nodes. Reproductive: Uterus and right adnexal regions are unremarkable. Due to previously described sigmoid diverticulitis and probable associated abscess, left ovary is not clearly visualized and most likely is obscured by surrounding inflammation. Other: No hernia is noted. Musculoskeletal: No acute or significant osseous findings. IMPRESSION: Focal sigmoid diverticulitis is noted, with adjacent complex fluid collection measuring 3.3 x 1.7 cm concerning for possible abscess. These results will be called to the ordering clinician or representative by the Radiologist Assistant, and communication documented in the PACS or zVision Dashboard. 9 mm rounded enhancing abnormality seen in right hepatic lobe which may simply represent hemangioma, but other pathology can not be excluded. Further evaluation with MRI of the liver with and without contrast administration is recommended on nonemergent basis. Electronically Signed   By: Marijo Conception, M.D.   On: 06/14/2018 10:08     Assessment/Plan Principal Problem:   Intra-abdominal abscess (HCC) Active Problems:   Hypokalemia   Acute diverticulitis   Acute sigmoid diverticulitis with abscess Patient presenting with 5-day history of severe abdominal discomfort with waxing and waning symptoms.  Evaluated by PCP and started on Augmentin on 06/13/2018.  CT scan notable for acute sigmoid diverticulitis with 3.3 x 1.7 cm fluid collection concerning for possible abscess.  Patient is afebrile, without leukocytosis and hemodynamically stable. --Admit to inpatient; Med/Surg --General surgery following, appreciate assistance --Continue IV antibiotics with Zosyn --IR consulted to evaluate for possible aspiration versus drainage --Clear liquid diet, n.p.o. after midnight --Supportive care  Right hepatic lobe lesion Incidental finding on CT abdomen/pelvis of a 9 mm rounded enhancing abnormality localized within the right hepatic lobe.  Etiology likely  hemangioma. --Can consider MR for further evaluation either inpatient versus outpatient follow-up  Hypokalemia Potassium 3.0 on admission.  Will replete --Repeat electrolytes in the a.m. to include magnesium  History of breast cancer s/p chemotherapy and mastectomy --Continue letrozole  DVT prophylaxis: SCDs, holding chemical prophylaxis for likely procedure tomorrow by IR Code Status: Full code Family Communication: Discussed with husband at bedside Disposition Plan: Anticipate discharge home when medically ready Consults called: General surgery consulted by ED provider, Dr. Marcello Moores Admission status: Inpatient, med/surg   Jhordan Kinter J [place] DO [facility] Pager [phone]  If 7PM-7AM, please contact night-coverage [email or web address] Password [email or web address]  06/14/2018, 7:18 PM

## 2018-06-14 NOTE — Telephone Encounter (Signed)
Recommend going to ED for possible IV antibiotics and/or drainage.  Algis Greenhouse. Jerline Pain, MD 06/14/2018 11:30 AM

## 2018-06-14 NOTE — ED Triage Notes (Signed)
Per pt, states she started having abdominal pain on Friday-states she had a CT scan done this am-PCP told her to come to ED for possible abdominal abscess-no pain at this time-history of diverticulosis but hasn't had a flare up in awhile

## 2018-06-15 ENCOUNTER — Encounter: Payer: Self-pay | Admitting: Nurse Practitioner

## 2018-06-15 ENCOUNTER — Encounter: Payer: Self-pay | Admitting: Family Medicine

## 2018-06-15 LAB — CBC
HCT: 38.8 % (ref 36.0–46.0)
Hemoglobin: 12.2 g/dL (ref 12.0–15.0)
MCH: 29.3 pg (ref 26.0–34.0)
MCHC: 31.4 g/dL (ref 30.0–36.0)
MCV: 93.3 fL (ref 80.0–100.0)
PLATELETS: 251 10*3/uL (ref 150–400)
RBC: 4.16 MIL/uL (ref 3.87–5.11)
RDW: 13.2 % (ref 11.5–15.5)
WBC: 7.1 10*3/uL (ref 4.0–10.5)
nRBC: 0 % (ref 0.0–0.2)

## 2018-06-15 LAB — COMPREHENSIVE METABOLIC PANEL
ALT: 15 U/L (ref 0–44)
AST: 16 U/L (ref 15–41)
Albumin: 3.5 g/dL (ref 3.5–5.0)
Alkaline Phosphatase: 53 U/L (ref 38–126)
Anion gap: 5 (ref 5–15)
BUN: 8 mg/dL (ref 8–23)
CALCIUM: 7.8 mg/dL — AB (ref 8.9–10.3)
CO2: 22 mmol/L (ref 22–32)
Chloride: 111 mmol/L (ref 98–111)
Creatinine, Ser: 0.74 mg/dL (ref 0.44–1.00)
GFR calc non Af Amer: >60 mL/min (ref >60)
Glucose, Bld: 99 mg/dL (ref 70–99)
Potassium: 4.6 mmol/L (ref 3.5–5.1)
Sodium: 138 mmol/L (ref 135–145)
Total Bilirubin: 0.8 mg/dL (ref 0.3–1.2)
Total Protein: 6.1 g/dL — ABNORMAL LOW (ref 6.5–8.1)

## 2018-06-15 LAB — PROTIME-INR
INR: 0.9 (ref 0.8–1.2)
Prothrombin Time: 12.4 seconds (ref 11.4–15.2)

## 2018-06-15 LAB — MAGNESIUM: Magnesium: 2.2 mg/dL (ref 1.7–2.4)

## 2018-06-15 MED ORDER — LOPERAMIDE HCL 2 MG PO CAPS: 2.0000 mg | ORAL_CAPSULE | Freq: Four times a day (QID) | ORAL | Status: DC | PRN

## 2018-06-15 MED ORDER — SODIUM CHLORIDE 0.9 % IV SOLN
Freq: Once | INTRAVENOUS | Status: AC
  Administered 2018-06-15: 02:00:00 via INTRAVENOUS

## 2018-06-15 NOTE — Progress Notes (Signed)
Patient ID: Patricia Chandler, female   DOB: 03-Jun-1955, 63 y.o.   MRN: [id number] Request received for CT guided abd/pelvic abscess drainage in pt. Latest imaging studies were reviewed by Dr. Kathlene Cote. He states that there is no drainable abscess present.The area of concern appears to be mainly left ovary with a small amount of adjacent fluid, possibly reactive/inflammatory.Overall findings are concerning for malignancy of sigmoid region with ?apple core lesion . Rec GI consult. [facility] PA notified.

## 2018-06-15 NOTE — Progress Notes (Signed)
Intra-abdominal abscess (HCC)  Subjective: Pt with no changes overnight.  asymptomatic  Objective: Vital signs in last 24 hours: Temp:  [97.8 F (36.6 C)-98.1 F (36.7 C)] 98 F (36.7 C) (02/26 0550) Pulse Rate:  [88-109] 97 (02/26 0550) Resp:  [15-18] 16 (02/26 0550) BP: (109-139)/(74-98) 128/82 (02/26 0550) SpO2:  [96 %-99 %] 99 % (02/26 0550) Weight:  [68 kg] 68 kg (02/25 1229) Last BM Date: 06/14/18  Intake/Output from previous day: 02/25 0701 - 02/26 0700 In: 1861.4 [P.O.:250; I.V.:186.7; IV Piggyback:1424.7] Out: 950 [Urine:950] Intake/Output this shift: Total I/O In: 43 [IV Piggyback:43] Out: 0   General appearance: alert and cooperative GI: normal findings: soft, non-tender  Lab Results:  Results for orders placed or performed during the hospital encounter of 06/14/18 (from the past 24 hour(s))  CBC with Differential     Status: None   Collection Time: 06/14/18  3:25 PM  Result Value Ref Range   WBC 9.3 4.0 - 10.5 K/uL   RBC 4.83 3.87 - 5.11 MIL/uL   Hemoglobin 14.2 12.0 - 15.0 g/dL   HCT 43.4 36.0 - 46.0 %   MCV 89.9 80.0 - 100.0 fL   MCH 29.4 26.0 - 34.0 pg   MCHC 32.7 30.0 - 36.0 g/dL   RDW 13.0 11.5 - 15.5 %   Platelets 276 150 - 400 K/uL   nRBC 0.0 0.0 - 0.2 %   Neutrophils Relative % 70 %   Neutro Abs 6.5 1.7 - 7.7 K/uL   Lymphocytes Relative 21 %   Lymphs Abs 1.9 0.7 - 4.0 K/uL   Monocytes Relative 6 %   Monocytes Absolute 0.6 0.1 - 1.0 K/uL   Eosinophils Relative 3 %   Eosinophils Absolute 0.3 0.0 - 0.5 K/uL   Basophils Relative 0 %   Basophils Absolute 0.0 0.0 - 0.1 K/uL   Immature Granulocytes 0 %   Abs Immature Granulocytes 0.03 0.00 - 0.07 K/uL  Comprehensive metabolic panel     Status: Abnormal   Collection Time: 06/14/18  3:25 PM  Result Value Ref Range   Sodium 136 135 - 145 mmol/L   Potassium 3.0 (L) 3.5 - 5.1 mmol/L   Chloride 102 98 - 111 mmol/L   CO2 26 22 - 32 mmol/L   Glucose, Bld 97 70 - 99 mg/dL   BUN 10 8 - 23 mg/dL   Creatinine, Ser 0.76 0.44 - 1.00 mg/dL   Calcium 8.6 (L) 8.9 - 10.3 mg/dL   Total Protein 7.6 6.5 - 8.1 g/dL   Albumin 4.3 3.5 - 5.0 g/dL   AST 20 15 - 41 U/L   ALT 15 0 - 44 U/L   Alkaline Phosphatase 64 38 - 126 U/L   Total Bilirubin 0.8 0.3 - 1.2 mg/dL   GFR calc non Af Amer >60 >60 mL/min   GFR calc Af Amer >60 >60 mL/min   Anion gap 8 5 - 15  Lactic acid, plasma     Status: None   Collection Time: 06/14/18  3:25 PM  Result Value Ref Range   Lactic Acid, Venous 0.8 0.5 - 1.9 mmol/L  Blood culture (routine x 2)     Status: None (Preliminary result)   Collection Time: 06/14/18  3:25 PM  Result Value Ref Range   Specimen Description      BLOOD LEFT ANTECUBITAL Performed at [facility], [address] [place], [place] [address]    Special Requests      BOTTLES DRAWN AEROBIC AND  ANAEROBIC Blood Culture adequate volume Performed at [facility] [address] [place], [place] [address]    Culture      NO GROWTH < 24 HOURS Performed at [facility] [address] [place], [place] [address]    Report Status PENDING   Blood culture (routine x 2)     Status: None (Preliminary result)   Collection Time: 06/14/18  3:43 PM  Result Value Ref Range   Specimen Description      BLOOD LEFT WRIST Performed at [facility] [address] [place], [place] [address]    Special Requests      BOTTLES DRAWN AEROBIC AND ANAEROBIC Blood Culture adequate volume Performed at [facility] [address] [place],  [address]    Culture      NO GROWTH < 24 HOURS Performed at [facility] [address], [place],  [address]    Report Status PENDING   Comprehensive metabolic panel     Status: Abnormal   Collection Time: 06/15/18  5:10 AM  Result Value Ref Range   Sodium 138 135 - 145 mmol/L   Potassium 4.6 3.5 - 5.1 mmol/L   Chloride 111 98 - 111 mmol/L   CO2 22 22 - 32 mmol/L    Glucose, Bld 99 70 - 99 mg/dL   BUN 8 8 - 23 mg/dL   Creatinine, Ser 0.74 0.44 - 1.00 mg/dL   Calcium 7.8 (L) 8.9 - 10.3 mg/dL   Total Protein 6.1 (L) 6.5 - 8.1 g/dL   Albumin 3.5 3.5 - 5.0 g/dL   AST 16 15 - 41 U/L   ALT 15 0 - 44 U/L   Alkaline Phosphatase 53 38 - 126 U/L   Total Bilirubin 0.8 0.3 - 1.2 mg/dL   GFR calc non Af Amer >60 >60 mL/min   GFR calc Af Amer >60 >60 mL/min   Anion gap 5 5 - 15  CBC     Status: None   Collection Time: 06/15/18  5:10 AM  Result Value Ref Range   WBC 7.1 4.0 - 10.5 K/uL   RBC 4.16 3.87 - 5.11 MIL/uL   Hemoglobin 12.2 12.0 - 15.0 g/dL   HCT 38.8 36.0 - 46.0 %   MCV 93.3 80.0 - 100.0 fL   MCH 29.3 26.0 - 34.0 pg   MCHC 31.4 30.0 - 36.0 g/dL   RDW 13.2 11.5 - 15.5 %   Platelets 251 150 - 400 K/uL   nRBC 0.0 0.0 - 0.2 %  Protime-INR     Status: None   Collection Time: 06/15/18  5:10 AM  Result Value Ref Range   Prothrombin Time 12.4 11.4 - 15.2 seconds   INR 0.9 0.8 - 1.2  Magnesium     Status: None   Collection Time: 06/15/18  5:10 AM  Result Value Ref Range   Magnesium 2.2 1.7 - 2.4 mg/dL     Studies/Results Radiology     MEDS, Scheduled . letrozole  2.5 mg Oral Daily     Assessment: Intra-abdominal abscess (HCC) IR leaning towards colon cancer and reactive fluid collection.  Unable to be drained presently.  Plan: Soft diet today If she tolerated this, I think she could switch to PO abx and d/c tom Repeat CT in 1 week as outpt. Will arrange f/u with GI for colonoscopy  LOS: 1 day    Rosario Adie, MD [facility], [place]  06/15/2018 10:07 AM

## 2018-06-15 NOTE — Progress Notes (Signed)
PROGRESS NOTE    Patricia Chandler  RUE:[id number] DOB: 09-Jul-1955 DOA: 06/14/2018 PCP: [name], MD   Brief Narrative: 63 y.o. female with medical history significant of breast cancer with completion of chemotherapy in 2017 and history of diverticulosis who presents to [facility] long ED by direction of PCP for concerning CT scan findings.  Patient reports onset of abdominal pain roughly 5 days ago that was severe and diffuse.  During the interval course, patient's symptoms have waxed and waned with more localized findings to bilateral lower quadrants.  During this timeframe she has had increasing nausea with decreased appetite.  She reports no changes in her home medication regimen or no sick contacts.  Evaluated by PCP yesterday who started her on Augmentin for concerns of diverticulitis.  A CT scan abdomen/pelvis was ordered that was notable for sigmoid diverticulitis with an adjacent complex fluid collection measuring 3.3 x 1.7 cm concerning for possible abscess; and she was directed by her PCP to proceed to the ED for further evaluation and treatment.  Patient currently without any significant complaints, states abdominal pain has resolved.  She further denies headaches, no fever/chills/night sweats, no current nausea/vomiting/diarrhea, no cough/congestion, no chest pain, no palpitations, no issues with bowel/bladder function, no sore throat, and no paresthesias.  ED Course: Temperature 97.8, HR 97, RR 18, BP 139/98, oxygenating 100% on room air.  Sodium 136, potassium 3.0, creatinine 0.76, glucose 27.  WBC count 9.3, hemoglobin 14.2.  CT abdomen/pelvis notable for sigmoid diverticulitis with a 3.3 x 1.7 cm fluid collection concerning for possible abscess.  Also noted a 9 mm rounded enhancing abnormality right hepatic lobe could be consistent with a hemangioma but recommend further imaging with MRI if concern.  General surgery was consulted for evaluation given her intra-abdominal abscess,  and requested hospitalist admission for acute diverticulitis with likely abscess formation.  Assessment & Plan:   Principal Problem:   Intra-abdominal abscess ([place]) Active Problems:   Hypokalemia   Acute diverticulitis   Acute sigmoid diverticulitis with abscess Patient presenting with 5-day history of severe abdominal discomfort with waxing and waning symptoms.  Evaluated by PCP and started on Augmentin on 06/13/2018.  CT scan notable for acute sigmoid diverticulitis with 3.3 x 1.7 cm fluid collection concerning for possible abscess.  Patient is afebrile, without leukocytosis and hemodynamically stable.  Patient followed by general surgery.  IR notes reviewed.  And appreciated.  Continue IV antibiotics.  Diet advanced by surgery.  Patient will have outpatient follow-up with GI for colonoscopy.  Patient needs inpatient hospital stay for IV antibiotics.  She has still not been on a regular diet.  Right hepatic lobe lesion Incidental finding on CT abdomen/pelvis of a 9 mm rounded enhancing abnormality localized within the right hepatic lobe.  Etiology likely hemangioma. --Can consider MR for further evaluation either inpatient versus outpatient follow-up  Hypokalemia resolved.   History of breast cancer s/p chemotherapy and mastectomy --Continue letrozole  Estimated body mass index is 26.57 kg/m as calculated from the following:   Height as of this encounter: 5\' 3"  (1.6 m).   Weight as of this encounter: 68 kg.  DVT prophylaxis: scd Code Status: full Family Communication:none Disposition Plan: pending improvement   Consultants:   surgery  Procedures none Antimicrobials: zosyn  Subjective:  Denies pain nausea vomiting has some loose bms  Objective: Vitals:   06/14/18 1752 06/14/18 1842 06/14/18 2149 06/15/18 0550  BP: (!) 119/91 116/83 109/74 128/82  Pulse: 91 89 88 97  Resp: 18  18 16 16   Temp:  98.1 F (36.7 C) 98 F (36.7 C) 98 F (36.7 C)  TempSrc:  Oral Oral  Oral  SpO2: 99% 98% 96% 99%  Weight:      Height:        Intake/Output Summary (Last 24 hours) at 06/15/2018 1201 Last data filed at 06/15/2018 1001 Gross per 24 hour  Intake 1904.43 ml  Output 950 ml  Net 954.43 ml   Filed Weights   06/14/18 1229  Weight: 68 kg    Examination:  General exam: Appears calm and comfortable  Respiratory system: Clear to auscultation. Respiratory effort normal. Cardiovascular system: S1 & S2 heard, RRR. No JVD, murmurs, rubs, gallops or clicks. No pedal edema. Gastrointestinal system: Abdomen is nondistended, soft and nontender. No organomegaly or masses felt. Normal bowel sounds heard. Central nervous system: Alert and oriented. No focal neurological deficits. Extremities: Symmetric 5 x 5 power. Skin: No rashes, lesions or ulcers Psychiatry: Judgement and insight appear normal. Mood & affect appropriate.     Data Reviewed: I have personally reviewed following labs and imaging studies  CBC: Recent Labs  Lab 06/13/18 1522 06/14/18 1525 06/15/18 0510  WBC 11.8* 9.3 7.1  NEUTROABS 8.3* 6.5  --   HGB 14.3 14.2 12.2  HCT 42.6 43.4 38.8  MCV 88.6 89.9 93.3  PLT 266.0 276 009   Basic Metabolic Panel: Recent Labs  Lab 06/13/18 1522 06/14/18 1525 06/15/18 0510  NA 136 136 138  K 4.1 3.0* 4.6  CL 99 102 111  CO2 26 26 22   GLUCOSE 81 97 99  BUN 13 10 8   CREATININE 0.74 0.76 0.74  CALCIUM 9.5 8.6* 7.8*  MG  --   --  2.2   GFR: Estimated Creatinine Clearance: 64.9 mL/min (by C-G formula based on SCr of 0.74 mg/dL). Liver Function Tests: Recent Labs  Lab 06/13/18 1522 06/14/18 1525 06/15/18 0510  AST 15 20 16   ALT 11 15 15   ALKPHOS 71 64 53  BILITOT 0.6 0.8 0.8  PROT 6.9 7.6 6.1*  ALBUMIN 4.5 4.3 3.5   No results for input(s): LIPASE, AMYLASE in the last 168 hours. No results for input(s): AMMONIA in the last 168 hours. Coagulation Profile: Recent Labs  Lab 06/15/18 0510  INR 0.9   Cardiac Enzymes: No results for  input(s): CKTOTAL, CKMB, CKMBINDEX, TROPONINI in the last 168 hours. BNP (last 3 results) No results for input(s): PROBNP in the last 8760 hours. HbA1C: No results for input(s): HGBA1C in the last 72 hours. CBG: No results for input(s): GLUCAP in the last 168 hours. Lipid Profile: No results for input(s): CHOL, HDL, LDLCALC, TRIG, CHOLHDL, LDLDIRECT in the last 72 hours. Thyroid Function Tests: No results for input(s): TSH, T4TOTAL, FREET4, T3FREE, THYROIDAB in the last 72 hours. Anemia Panel: No results for input(s): VITAMINB12, FOLATE, FERRITIN, TIBC, IRON, RETICCTPCT in the last 72 hours. Sepsis Labs: Recent Labs  Lab 06/14/18 1525  LATICACIDVEN 0.8    Recent Results (from the past 240 hour(s))  Blood culture (routine x 2)     Status: None (Preliminary result)   Collection Time: 06/14/18  3:25 PM  Result Value Ref Range Status   Specimen Description   Final    BLOOD LEFT ANTECUBITAL Performed at [facility] [address] [place], [place] [address]    Special Requests   Final    BOTTLES DRAWN AEROBIC AND ANAEROBIC Blood Culture adequate volume Performed at [facility]  [address] [place], [place] [address]    Culture   Final    NO GROWTH < 24 HOURS Performed at [facility], [place] [address], [place], [place] [address]    Report Status PENDING  Incomplete  Blood culture (routine x 2)     Status: None (Preliminary result)   Collection Time: 06/14/18  3:43 PM  Result Value Ref Range Status   Specimen Description   Final    BLOOD LEFT WRIST Performed at [facility] [address], [place], [place] [address]    Special Requests   Final    BOTTLES DRAWN AEROBIC AND ANAEROBIC Blood Culture adequate volume Performed at [facility] [address] [place], [place] [address]    Culture   Final    NO GROWTH < 24 HOURS Performed at [facility] [address]  [place],  [address]    Report Status PENDING  Incomplete         Radiology Studies: Ct Abdomen Pelvis W Contrast  Result Date: 06/14/2018 CLINICAL DATA:  Acute left lower quadrant abdominal pain. EXAM: CT ABDOMEN AND PELVIS WITH CONTRAST TECHNIQUE: Multidetector CT imaging of the abdomen and pelvis was performed using the standard protocol following bolus administration of intravenous contrast. CONTRAST:  194mL ISOVUE-300 IOPAMIDOL (ISOVUE-300) INJECTION 61% COMPARISON:  None. FINDINGS: Lower chest: No acute abnormality. Hepatobiliary: No gallstones or biliary dilatation is noted. Left hepatic cyst is noted. 9 mm enhancing abnormality is noted in right hepatic lobe. Pancreas: Unremarkable. No pancreatic ductal dilatation or surrounding inflammatory changes. Spleen: Normal in size without focal abnormality. Adrenals/Urinary Tract: Adrenal glands are unremarkable. Kidneys are normal, without renal calculi, focal lesion, or hydronephrosis. Bladder is unremarkable. Stomach/Bowel: The stomach appears normal. The appendix appears normal. There is no evidence of bowel obstruction. Focal diverticulitis of sigmoid colon is noted. Complex fluid collection is seen adjacent to inflamed segment of colon which measures 3.3 x 1.7 cm and is concerning for possible abscess. Vascular/Lymphatic: No significant vascular findings are present. No enlarged abdominal or pelvic lymph nodes. Reproductive: Uterus and right adnexal regions are unremarkable. Due to previously described sigmoid diverticulitis and probable associated abscess, left ovary is not clearly visualized and most likely is obscured by surrounding inflammation. Other: No hernia is noted. Musculoskeletal: No acute or significant osseous findings. IMPRESSION: Focal sigmoid diverticulitis is noted, with adjacent complex fluid collection measuring 3.3 x 1.7 cm concerning for possible abscess. These results will be called to the ordering clinician or representative  by the Radiologist Assistant, and communication documented in the PACS or zVision Dashboard. 9 mm rounded enhancing abnormality seen in right hepatic lobe which may simply represent hemangioma, but other pathology can not be excluded. Further evaluation with MRI of the liver with and without contrast administration is recommended on nonemergent basis. Electronically Signed   By: Marijo Conception, M.D.   On: 06/14/2018 10:08        Scheduled Meds: . letrozole  2.5 mg Oral Daily   Continuous Infusions: . sodium chloride 1,000 mL (06/14/18 1540)  . piperacillin-tazobactam    . piperacillin-tazobactam (ZOSYN)  IV 3.375 g (06/15/18 0526)     LOS: 1 day     Georgette Shell, MD [facility]  If 7PM-7AM, please contact night-coverage [email or web address] Password [email or web address] 06/15/2018, 12:01 PM

## 2018-06-16 LAB — CBC
HCT: 37.8 % (ref 36.0–46.0)
Hemoglobin: 12 g/dL (ref 12.0–15.0)
MCH: 29.4 pg (ref 26.0–34.0)
MCHC: 31.7 g/dL (ref 30.0–36.0)
MCV: 92.6 fL (ref 80.0–100.0)
Platelets: 245 K/uL (ref 150–400)
RBC: 4.08 MIL/uL (ref 3.87–5.11)
RDW: 13.2 % (ref 11.5–15.5)
WBC: 6.5 K/uL (ref 4.0–10.5)
nRBC: 0 % (ref 0.0–0.2)

## 2018-06-16 LAB — BASIC METABOLIC PANEL WITH GFR
Anion gap: 7 (ref 5–15)
BUN: 10 mg/dL (ref 8–23)
CO2: 18 mmol/L — ABNORMAL LOW (ref 22–32)
Calcium: 7.9 mg/dL — ABNORMAL LOW (ref 8.9–10.3)
Chloride: 113 mmol/L — ABNORMAL HIGH (ref 98–111)
Creatinine, Ser: 0.67 mg/dL (ref 0.44–1.00)
GFR calc Af Amer: >60 mL/min
GFR calc non Af Amer: >60 mL/min
Glucose, Bld: 90 mg/dL (ref 70–99)
Potassium: 3.9 mmol/L (ref 3.5–5.1)
Sodium: 138 mmol/L (ref 135–145)

## 2018-06-16 LAB — CEA: CEA: 1.6 ng/mL (ref 0.0–4.7)

## 2018-06-16 MED ORDER — SACCHAROMYCES BOULARDII 250 MG PO CAPS: 250.0000 mg | ORAL_CAPSULE | Freq: Two times a day (BID) | ORAL | 1 refills | Status: DC

## 2018-06-16 MED ORDER — METRONIDAZOLE 500 MG PO TABS: 500.0000 mg | ORAL_TABLET | Freq: Three times a day (TID) | ORAL | 0 refills | Status: AC

## 2018-06-16 MED ORDER — CIPROFLOXACIN HCL 500 MG PO TABS: 500.0000 mg | ORAL_TABLET | Freq: Two times a day (BID) | ORAL | 0 refills | Status: AC

## 2018-06-16 NOTE — Progress Notes (Signed)
Discharge instructions given to pt and all questions were answered.  

## 2018-06-16 NOTE — Discharge Summary (Signed)
Physician Discharge Summary  Patricia Chandler DXA:[id number] DOB: 1955-12-15 DOA: 06/14/2018  PCP: [name], MD  Admit date: 06/14/2018 Discharge date: 06/16/2018  Admitted From: Home Disposition: Home  Recommendations for Outpatient Follow-up:  1. Follow up with PCP in 1-2 weeks 2. Please obtain BMP/CBC in one week 3. Please follow up with GI appointment made for March 10 with the lab our [place] none Equipment/Devices: None Discharge Condition stable and improved  CODE STATUS full code  diet recommendation: Cardiac  Brief/Interim Summary:63 y.o.femalewith medical history significant ofbreast cancer with completion of chemotherapy in 2017 and history of diverticulosis who presents to [facility] long ED by direction of PCP for concerning CT scan findings. Patient reports onset of abdominal pain roughly 5 days ago that was severe and diffuse. During the interval course, patient's symptoms have waxed and waned with more localized findings to bilateral lower quadrants. During this timeframe she has had increasing nausea with decreased appetite. She reports no changes in her home medication regimen or no sick contacts. Evaluated by PCP yesterday who started her on Augmentin for concerns of diverticulitis. A CT scan abdomen/pelvis was ordered that was notable for sigmoid diverticulitis with an adjacent complex fluid collection measuring 3.3 x 1.7 cm concerning for possible abscess;and she was directed by her PCP to proceed to the ED for further evaluation and treatment.  Patient currently without any significant complaints, states abdominal pain has resolved. She further denies headaches, no fever/chills/night sweats, no current nausea/vomiting/diarrhea, no cough/congestion, no chest pain, no palpitations, no issues with bowel/bladder function, no sore throat, and no paresthesias.  ED Course:Temperature 97.8, HR 97, RR 18, BP 139/98, oxygenating 100% on room air. Sodium  136, potassium 3.0, creatinine 0.76, glucose 27. WBC count 9.3, hemoglobin 14.2. CT abdomen/pelvis notable for sigmoid diverticulitis with a 3.3 x 1.7 cm fluid collection concerning for possible abscess. Also noted a 9 mm rounded enhancing abnormality right hepatic lobe could be consistent with a hemangioma but recommend further imaging with MRI if concern. General surgery was consulted for evaluation given her intra-abdominal abscess, and requested hospitalist admission for acute diverticulitis with likely abscess formation.  Discharge Diagnoses:  Principal Problem:   Intra-abdominal abscess ([place]) Active Problems:   Hypokalemia   Acute diverticulitis  Acute sigmoid diverticulitis with abscess Patient presented with 5-day history of severe abdominal discomfort with waxing and waning symptoms. Evaluated by PCP and started on Augmentin on 06/13/2018. CT scan notable for acute sigmoid diverticulitis with 3.3 x 1.7 cm fluid collection concerning for possible abscess.  Patient was started on IV Zosyn.  She remained hemodynamically stable with normal WBC count.  She was seen in consultation by general surgery.  An IR consult was placed for possible drainage of this abscess however they looked at the CT scan and came to conclusion this abscess is too small to be drained and need to continue antibiotics.  Diet was advanced to soft diet patient tolerated well without nausea vomiting or abdominal pain.   Right hepatic lobe lesion Incidental finding on CT abdomen/pelvis of a 9 mm rounded enhancing abnormality localized within the right hepatic lobe. Etiology likely hemangioma.  Consider MRI as an outpatient.  Hypokalemia resolved.   History of breast cancers/pchemotherapy and mastectomy --Continueletrozole   Estimated body mass index is 26.57 kg/m as calculated from the following:   Height as of this encounter: 5\' 3"  (1.6 m).   Weight as of this encounter: 68 kg.  Discharge  Instructions  Discharge Instructions  Call MD for:  difficulty breathing, headache or visual disturbances   Complete by:  As directed    Call MD for:  persistant dizziness or light-headedness   Complete by:  As directed    Call MD for:  persistant nausea and vomiting   Complete by:  As directed    Call MD for:  redness, tenderness, or signs of infection (pain, swelling, redness, odor or green/yellow discharge around incision site)   Complete by:  As directed    Call MD for:  severe uncontrolled pain   Complete by:  As directed    Call MD for:  temperature >100.4   Complete by:  As directed    Diet - low sodium heart healthy   Complete by:  As directed    Increase activity slowly   Complete by:  As directed      Allergies as of 06/16/2018   No Known Allergies     Medication List    STOP taking these medications   amoxicillin-clavulanate 875-125 MG tablet Commonly known as:  AUGMENTIN   azithromycin 250 MG tablet Commonly known as:  ZITHROMAX     TAKE these medications   albuterol 108 (90 Base) MCG/ACT inhaler Commonly known as:  PROVENTIL HFA;VENTOLIN HFA Inhale 2 puffs into the lungs every 6 (six) hours as needed for wheezing or shortness of breath.   ciprofloxacin 500 MG tablet Commonly known as:  CIPRO Take 1 tablet (500 mg total) by mouth 2 (two) times daily for 10 days.   denosumab 60 MG/ML Soln injection Commonly known as:  PROLIA Inject 60 mg into the skin every 6 (six) months. Administer in upper arm, thigh, or abdomen   letrozole 2.5 MG tablet Commonly known as:  FEMARA Take 1 tablet (2.5 mg total) by mouth daily.   metroNIDAZOLE 500 MG tablet Commonly known as:  FLAGYL Take 1 tablet (500 mg total) by mouth 3 (three) times daily for 10 days.   saccharomyces boulardii 250 MG capsule Commonly known as:  FLORASTOR Take 1 capsule (250 mg total) by mouth 2 (two) times daily.   Turmeric Curcumin Caps Take 1 capsule by mouth daily.   VITAMIN B-12  PO Take 1 tablet by mouth daily.   Vitamin D (Ergocalciferol) 1.25 MG (50000 UT) Caps capsule Commonly known as:  DRISDOL Take 1 capsule (50,000 Units total) by mouth every 7 (seven) days.   zolpidem 5 MG tablet Commonly known as:  AMBIEN TAKE ONE TABLET BY MOUTH AT BEDTIME AS NEEDED FOR SLEEP What changed:    how much to take  reasons to take this  additional instructions      Follow-up Information    Willia Craze, NP Follow up on 06/28/2018.   Specialty:  Gastroenterology Why:  at 9:30am. Please arrive 10 minutes early.  Contact information: [place] [address] [phone]        [name], MD Follow up.   Specialty:  Family Medicine Contact information: [place] [address] [phone]        Nicholas Lose, MD Follow up.   Specialty:  Hematology and Oncology Contact information: [place] [address] [phone]          No Known Allergies  Consultations:  General surgery   Procedures/Studies: Ct Abdomen Pelvis W Contrast  Result Date: 06/14/2018 CLINICAL DATA:  Acute left lower quadrant abdominal pain. EXAM: CT ABDOMEN AND PELVIS WITH CONTRAST TECHNIQUE: Multidetector CT imaging of  the abdomen and pelvis was performed using the standard protocol following bolus administration of intravenous contrast. CONTRAST:  176mL ISOVUE-300 IOPAMIDOL (ISOVUE-300) INJECTION 61% COMPARISON:  None. FINDINGS: Lower chest: No acute abnormality. Hepatobiliary: No gallstones or biliary dilatation is noted. Left hepatic cyst is noted. 9 mm enhancing abnormality is noted in right hepatic lobe. Pancreas: Unremarkable. No pancreatic ductal dilatation or surrounding inflammatory changes. Spleen: Normal in size without focal abnormality. Adrenals/Urinary Tract: Adrenal glands are unremarkable. Kidneys are normal, without renal calculi, focal lesion, or hydronephrosis. Bladder is  unremarkable. Stomach/Bowel: The stomach appears normal. The appendix appears normal. There is no evidence of bowel obstruction. Focal diverticulitis of sigmoid colon is noted. Complex fluid collection is seen adjacent to inflamed segment of colon which measures 3.3 x 1.7 cm and is concerning for possible abscess. Vascular/Lymphatic: No significant vascular findings are present. No enlarged abdominal or pelvic lymph nodes. Reproductive: Uterus and right adnexal regions are unremarkable. Due to previously described sigmoid diverticulitis and probable associated abscess, left ovary is not clearly visualized and most likely is obscured by surrounding inflammation. Other: No hernia is noted. Musculoskeletal: No acute or significant osseous findings. IMPRESSION: Focal sigmoid diverticulitis is noted, with adjacent complex fluid collection measuring 3.3 x 1.7 cm concerning for possible abscess. These results will be called to the ordering clinician or representative by the Radiologist Assistant, and communication documented in the PACS or zVision Dashboard. 9 mm rounded enhancing abnormality seen in right hepatic lobe which may simply represent hemangioma, but other pathology can not be excluded. Further evaluation with MRI of the liver with and without contrast administration is recommended on nonemergent basis. Electronically Signed   By: Marijo Conception, M.D.   On: 06/14/2018 10:08    (Echo, Carotid, EGD, Colonoscopy, ERCP)    Subjective: Sitting up in bed tolerated p.o. intake denies any abdominal pain had 4 small loose stools overnight in the last 24 hours.  Discharge Exam: Vitals:   06/15/18 2132 06/16/18 0423  BP: 110/78 105/77  Pulse: 76 79  Resp: 16 16  Temp: 98.2 F (36.8 C) 98.1 F (36.7 C)  SpO2: 99% 98%   Vitals:   06/15/18 0550 06/15/18 1322 06/15/18 2132 06/16/18 0423  BP: 128/82 116/80 110/78 105/77  Pulse: 97 84 76 79  Resp: 16 18 16 16   Temp: 98 F (36.7 C) 98.1 F (36.7 C)  98.2 F (36.8 C) 98.1 F (36.7 C)  TempSrc: Oral Oral Oral Oral  SpO2: 99% 96% 99% 98%  Weight:      Height:        General: Pt is alert, awake, not in acute distress Cardiovascular: RRR, S1/S2 +, no rubs, no gallops Respiratory: CTA bilaterally, no wheezing, no rhonchi Abdominal: Soft, NT, ND, bowel sounds + Extremities: no edema, no cyanosis    The results of significant diagnostics from this hospitalization (including imaging, microbiology, ancillary and laboratory) are listed below for reference.     Microbiology: Recent Results (from the past 240 hour(s))  Blood culture (routine x 2)     Status: None (Preliminary result)   Collection Time: 06/14/18  3:25 PM  Result Value Ref Range Status   Specimen Description   Final    BLOOD LEFT ANTECUBITAL Performed at [facility] [address], [place], [place] [address]    Special Requests   Final    BOTTLES DRAWN AEROBIC AND ANAEROBIC Blood Culture adequate volume Performed at [facility] [address] [place], [place] [address]  Culture   Final    NO GROWTH < 24 HOURS Performed at [facility], [place] [address], [place], [place] [address]    Report Status PENDING  Incomplete  Blood culture (routine x 2)     Status: None (Preliminary result)   Collection Time: 06/14/18  3:43 PM  Result Value Ref Range Status   Specimen Description   Final    BLOOD LEFT WRIST Performed at [facility] [address], [place], [place] [address]    Special Requests   Final    BOTTLES DRAWN AEROBIC AND ANAEROBIC Blood Culture adequate volume Performed at [facility] [address] [place], [place] [address]    Culture   Final    NO GROWTH < 24 HOURS Performed at [facility] [address] [place], [place] [address]    Report Status PENDING  Incomplete     Labs: BNP (last 3 results) No results for input(s): BNP in the last 8760  hours. Basic Metabolic Panel: Recent Labs  Lab 06/13/18 1522 06/14/18 1525 06/15/18 0510 06/16/18 0418  NA 136 136 138 138  K 4.1 3.0* 4.6 3.9  CL 99 102 111 113*  CO2 26 26 22  18*  GLUCOSE 81 97 99 90  BUN 13 10 8 10   CREATININE 0.74 0.76 0.74 0.67  CALCIUM 9.5 8.6* 7.8* 7.9*  MG  --   --  2.2  --    Liver Function Tests: Recent Labs  Lab 06/13/18 1522 06/14/18 1525 06/15/18 0510  AST 15 20 16   ALT 11 15 15   ALKPHOS 71 64 53  BILITOT 0.6 0.8 0.8  PROT 6.9 7.6 6.1*  ALBUMIN 4.5 4.3 3.5   No results for input(s): LIPASE, AMYLASE in the last 168 hours. No results for input(s): AMMONIA in the last 168 hours. CBC: Recent Labs  Lab 06/13/18 1522 06/14/18 1525 06/15/18 0510 06/16/18 0418  WBC 11.8* 9.3 7.1 6.5  NEUTROABS 8.3* 6.5  --   --   HGB 14.3 14.2 12.2 12.0  HCT 42.6 43.4 38.8 37.8  MCV 88.6 89.9 93.3 92.6  PLT 266.0 276 251 245   Cardiac Enzymes: No results for input(s): CKTOTAL, CKMB, CKMBINDEX, TROPONINI in the last 168 hours. BNP: Invalid input(s): POCBNP CBG: No results for input(s): GLUCAP in the last 168 hours. D-Dimer No results for input(s): DDIMER in the last 72 hours. Hgb A1c No results for input(s): HGBA1C in the last 72 hours. Lipid Profile No results for input(s): CHOL, HDL, LDLCALC, TRIG, CHOLHDL, LDLDIRECT in the last 72 hours. Thyroid function studies No results for input(s): TSH, T4TOTAL, T3FREE, THYROIDAB in the last 72 hours.  Invalid input(s): FREET3 Anemia work up No results for input(s): VITAMINB12, FOLATE, FERRITIN, TIBC, IRON, RETICCTPCT in the last 72 hours. Urinalysis    Component Value Date/Time   LABSPEC 1.010 01/08/2016 1401   PHURINE 6.5 01/08/2016 1401   GLUCOSEU Negative 01/08/2016 1401   HGBUR Negative 01/08/2016 1401   BILIRUBINUR Negative 01/08/2016 1401   KETONESUR 15 01/08/2016 1401   PROTEINUR Negative 01/08/2016 1401   UROBILINOGEN 0.2 01/08/2016 1401   NITRITE Negative 01/08/2016 1401   LEUKOCYTESUR  Negative 01/08/2016 1401   Sepsis Labs Invalid input(s): PROCALCITONIN,  WBC,  LACTICIDVEN Microbiology Recent Results (from the past 240 hour(s))  Blood culture (routine x 2)     Status: None (Preliminary result)   Collection Time: 06/14/18  3:25 PM  Result Value Ref Range Status   Specimen  Description   Final    BLOOD LEFT ANTECUBITAL Performed at [facility] [address] [place], [place] [address]    Special Requests   Final    BOTTLES DRAWN AEROBIC AND ANAEROBIC Blood Culture adequate volume Performed at [facility] [address] [place], [place] [address]    Culture   Final    NO GROWTH < 24 HOURS Performed at [facility] [address], [place], [place] [address]    Report Status PENDING  Incomplete  Blood culture (routine x 2)     Status: None (Preliminary result)   Collection Time: 06/14/18  3:43 PM  Result Value Ref Range Status   Specimen Description   Final    BLOOD LEFT WRIST Performed at [facility] [address], [place], [place] [address]    Special Requests   Final    BOTTLES DRAWN AEROBIC AND ANAEROBIC Blood Culture adequate volume Performed at [facility] [address], [place], [place] [address]    Culture   Final    NO GROWTH < 24 HOURS Performed at [facility] [address], [place], [place] [address]    Report Status PENDING  Incomplete     Time coordinating discharge: 33 minutes  SIGNED:   Georgette Shell, MD  [facility] 06/16/2018, 8:41 AM Pager   If 7PM-7AM, please contact night-coverage [email or web address] Password [email or web address]

## 2018-06-16 NOTE — Progress Notes (Signed)
Subjective: CC: No complaints Patient reports no abdominal pain. Tolerating soft diet. No N/V. Passing flatus and loose BM's. Mobilizing.   Objective: Vital signs in last 24 hours: Temp:  [98.1 F (36.7 C)-98.2 F (36.8 C)] 98.1 F (36.7 C) (02/27 0423) Pulse Rate:  [76-84] 79 (02/27 0423) Resp:  [16-18] 16 (02/27 0423) BP: (105-116)/(77-80) 105/77 (02/27 0423) SpO2:  [96 %-99 %] 98 % (02/27 0423) Last BM Date: 06/15/18  Intake/Output from previous day: 02/26 0701 - 02/27 0700 In: 958.4 [P.O.:480; I.V.:280; IV Piggyback:198.4] Out: 600 [Urine:600] Intake/Output this shift: No intake/output data recorded.  PE: Gen: Awake and alert, NAD Heart: RRR Lungs: CTA b/l, normal effort Abdomen: Soft, NT, ND, +BS.  Msk: No edema  Lab Results:  Recent Labs    06/15/18 0510 06/16/18 0418  WBC 7.1 6.5  HGB 12.2 12.0  HCT 38.8 37.8  PLT 251 245   BMET Recent Labs    06/15/18 0510 06/16/18 0418  NA 138 138  K 4.6 3.9  CL 111 113*  CO2 22 18*  GLUCOSE 99 90  BUN 8 10  CREATININE 0.74 0.67  CALCIUM 7.8* 7.9*   PT/INR Recent Labs    06/15/18 0510  LABPROT 12.4  INR 0.9   CMP     Component Value Date/Time   NA 138 06/16/2018 0418   NA 140 10/20/2016 0757   K 3.9 06/16/2018 0418   K 3.9 10/20/2016 0757   CL 113 (H) 06/16/2018 0418   CO2 18 (L) 06/16/2018 0418   CO2 24 10/20/2016 0757   GLUCOSE 90 06/16/2018 0418   GLUCOSE 111 10/20/2016 0757   BUN 10 06/16/2018 0418   BUN 22.7 10/20/2016 0757   CREATININE 0.67 06/16/2018 0418   CREATININE 0.8 10/20/2016 0757   CALCIUM 7.9 (L) 06/16/2018 0418   CALCIUM 9.3 10/20/2016 0757   PROT 6.1 (L) 06/15/2018 0510   PROT 5.9 (L) 10/20/2016 0757   ALBUMIN 3.5 06/15/2018 0510   ALBUMIN 3.6 10/20/2016 0757   AST 16 06/15/2018 0510   AST 16 10/20/2016 0757   ALT 15 06/15/2018 0510   ALT 16 10/20/2016 0757   ALKPHOS 53 06/15/2018 0510   ALKPHOS 54 10/20/2016 0757   BILITOT 0.8 06/15/2018 0510   BILITOT 0.39  10/20/2016 0757   GFRNONAA >60 06/16/2018 0418   GFRAA >60 06/16/2018 0418   Lipase  No results found for: LIPASE     Studies/Results: Ct Abdomen Pelvis W Contrast  Result Date: 06/14/2018 CLINICAL DATA:  Acute left lower quadrant abdominal pain. EXAM: CT ABDOMEN AND PELVIS WITH CONTRAST TECHNIQUE: Multidetector CT imaging of the abdomen and pelvis was performed using the standard protocol following bolus administration of intravenous contrast. CONTRAST:  152mL ISOVUE-300 IOPAMIDOL (ISOVUE-300) INJECTION 61% COMPARISON:  None. FINDINGS: Lower chest: No acute abnormality. Hepatobiliary: No gallstones or biliary dilatation is noted. Left hepatic cyst is noted. 9 mm enhancing abnormality is noted in right hepatic lobe. Pancreas: Unremarkable. No pancreatic ductal dilatation or surrounding inflammatory changes. Spleen: Normal in size without focal abnormality. Adrenals/Urinary Tract: Adrenal glands are unremarkable. Kidneys are normal, without renal calculi, focal lesion, or hydronephrosis. Bladder is unremarkable. Stomach/Bowel: The stomach appears normal. The appendix appears normal. There is no evidence of bowel obstruction. Focal diverticulitis of sigmoid colon is noted. Complex fluid collection is seen adjacent to inflamed segment of colon which measures 3.3 x 1.7 cm and is concerning for possible abscess. Vascular/Lymphatic: No significant vascular findings are present. No enlarged abdominal  or pelvic lymph nodes. Reproductive: Uterus and right adnexal regions are unremarkable. Due to previously described sigmoid diverticulitis and probable associated abscess, left ovary is not clearly visualized and most likely is obscured by surrounding inflammation. Other: No hernia is noted. Musculoskeletal: No acute or significant osseous findings. IMPRESSION: Focal sigmoid diverticulitis is noted, with adjacent complex fluid collection measuring 3.3 x 1.7 cm concerning for possible abscess. These results will be  called to the ordering clinician or representative by the Radiologist Assistant, and communication documented in the PACS or zVision Dashboard. 9 mm rounded enhancing abnormality seen in right hepatic lobe which may simply represent hemangioma, but other pathology can not be excluded. Further evaluation with MRI of the liver with and without contrast administration is recommended on nonemergent basis. Electronically Signed   By: Marijo Conception, M.D.   On: 06/14/2018 10:08    Anti-infectives: Anti-infectives (From admission, onward)   Start     Dose/Rate Route Frequency Ordered Stop   06/14/18 2200  piperacillin-tazobactam (ZOSYN) IVPB 3.375 g     3.375 g 12.5 mL/hr over 240 Minutes Intravenous Every 8 hours 06/14/18 1658     06/14/18 1700  piperacillin-tazobactam (ZOSYN) IVPB 3.375 g     3.375 g 100 mL/hr over 30 Minutes Intravenous  Once 06/14/18 1655     06/14/18 1515  piperacillin-tazobactam (ZOSYN) IVPB 3.375 g     3.375 g 100 mL/hr over 30 Minutes Intravenous  Once 06/14/18 1511 06/14/18 1632       Assessment/Plan Hx of Breast Cancer  58mm rounded enhancing abnormality seen in right hepatic lobe   Diverticulitis with abscess - IR consulted on 2/26. No drainable abscess present. They are concerned for malignancy of the sigmoid colon.  - Arranged for GI follow up with [facility] on March 10th. Will require coloscopy for further evaluation.  - She is tolerating soft diet, ambulating well, pain well controlled, and vital signs stable - Okay to switch to oral antibiotics and discharge from surgical standpoint - Will have patient call for follow up with Dr. Marcello Moores after her colonoscopy - We will sign off  Plan: I had a long discussion with the patient that there is a concern for malignancy of her sigmoid colon on CT scan. I discussed with her the next step is to have a colonoscopy for further evaluation. Ffollow up with GI has been arranged for March 10th. I allowed for ample time for all  questions to be answered. The patient stated understanding. She requested I forward her chart to Dr. Nicholas Lose which I have done.    LOS: 2 days    Jillyn Ledger , [facility] 06/16/2018, 8:27 AM Pager: [phone]

## 2018-06-17 ENCOUNTER — Telehealth: Payer: Self-pay | Admitting: Family Medicine

## 2018-06-17 NOTE — Telephone Encounter (Signed)
Not sure how Dr. Yong Channel likes his same day slot filled. Routing to [place] for advise

## 2018-06-17 NOTE — Telephone Encounter (Signed)
Please contact patient and schedule for 2 same day slots (40 min slot)

## 2018-06-17 NOTE — Telephone Encounter (Signed)
Please advise 

## 2018-06-17 NOTE — Telephone Encounter (Signed)
Just need to know if its ok to put in a same day slot? Copied from [place] [phone]. Topic: Appointment Scheduling - Scheduling Inquiry for Clinic >> Jun 17, 2018  2:34 PM Wynetta Emery, [place] C wrote: Reason for CRM: pt called in to schedule a hospital follow up, not showing any hospital follow up openings. Called the office, spoke with [facility] and was advised that someone will get back with pt to assist with scheduling.

## 2018-06-17 NOTE — Telephone Encounter (Signed)
Please schedule

## 2018-06-19 LAB — CULTURE, BLOOD (ROUTINE X 2)
Culture: NO GROWTH
Culture: NO GROWTH
Special Requests: ADEQUATE
Special Requests: ADEQUATE

## 2018-06-21 ENCOUNTER — Encounter: Payer: Self-pay | Admitting: Family Medicine

## 2018-06-21 ENCOUNTER — Ambulatory Visit (INDEPENDENT_AMBULATORY_CARE_PROVIDER_SITE_OTHER): Payer: Medicare Other | Admitting: Family Medicine

## 2018-06-21 VITALS — BP 108/70 | HR 81 | Temp 98.3°F | Ht 63.0 in | Wt 152.8 lb

## 2018-06-21 DIAGNOSIS — K651 Peritoneal abscess: Secondary | ICD-10-CM

## 2018-06-21 DIAGNOSIS — K5720 Diverticulitis of large intestine with perforation and abscess without bleeding: Secondary | ICD-10-CM | POA: Diagnosis not present

## 2018-06-21 DIAGNOSIS — K769 Liver disease, unspecified: Secondary | ICD-10-CM | POA: Diagnosis not present

## 2018-06-21 DIAGNOSIS — K5792 Diverticulitis of intestine, part unspecified, without perforation or abscess without bleeding: Secondary | ICD-10-CM

## 2018-06-21 NOTE — Telephone Encounter (Signed)
Spoke to pt regarding GI concerns

## 2018-06-21 NOTE — Progress Notes (Signed)
Phone [phone]   Subjective:  Patricia Chandler is a 63 y.o. year old very pleasant female patient who presents for/with See problem oriented charting ROS- no fever, chills. Still with abdominal pain in LLQ but significantly improved. No nausea or vomiting. No chest pain or shortness of breath   Past Medical History-  Patient Active Problem List   Diagnosis Date Noted  . Port catheter in place 01/08/2016    Priority: High  . Breast cancer of upper-inner quadrant of right female breast ([place]) 12/18/2015    Priority: High  . Acute diverticulitis 06/14/2018    Priority: Medium  . Hypokalemia 03/30/2016    Priority: Medium  . Chemotherapy-induced thrombocytopenia 03/30/2016    Priority: Medium  . GERD (gastroesophageal reflux disease) 01/08/2016    Priority: Medium  . Acute bursitis of right shoulder 06/08/2017    Priority: Low  . Acute shoulder bursitis, left 03/08/2017    Priority: Low  . DDD (degenerative disc disease), cervical 10/08/2016    Priority: Low  . Genetic testing 01/10/2016    Priority: Low  . History of sepsis 01/08/2016    Priority: Low  . Family history of breast cancer     Priority: Low  . Internal hemorrhoid 01/26/2011    Priority: Low  . Liver lesion 06/21/2018  . Intra-abdominal abscess ([place]) 06/14/2018    Medications- reviewed and updated Current Outpatient Medications  Medication Sig Dispense Refill  . ciprofloxacin (CIPRO) 500 MG tablet Take 1 tablet (500 mg total) by mouth 2 (two) times daily for 10 days. 20 tablet 0  . Cyanocobalamin (VITAMIN B-12 PO) Take 1 tablet by mouth daily.    Marland Kitchen denosumab (PROLIA) 60 MG/ML SOLN injection Inject 60 mg into the skin every 6 (six) months. Administer in upper arm, thigh, or abdomen    . letrozole (FEMARA) 2.5 MG tablet Take 1 tablet (2.5 mg total) by mouth daily. 90 tablet 3  . metroNIDAZOLE (FLAGYL) 500 MG tablet Take 1 tablet (500 mg total) by mouth 3 (three) times daily for 10 days. 30 tablet 0  .  Misc Natural Products (TURMERIC CURCUMIN) CAPS Take 1 capsule by mouth daily.    Marland Kitchen saccharomyces boulardii (FLORASTOR) 250 MG capsule Take 1 capsule (250 mg total) by mouth 2 (two) times daily. 60 capsule 1  . Vitamin D, Ergocalciferol, (DRISDOL) 50000 units CAPS capsule Take 1 capsule (50,000 Units total) by mouth every 7 (seven) days. 12 capsule 0  . zolpidem (AMBIEN) 5 MG tablet TAKE ONE TABLET BY MOUTH AT BEDTIME AS NEEDED FOR SLEEP (Patient taking differently: Take 2.5 mg by mouth at bedtime as needed for sleep. ) 30 tablet 0   No current facility-administered medications for this visit.      Objective:  BP 108/70 (BP Location: Left Arm, Patient Position: Sitting, Cuff Size: Normal)   Pulse 81   Temp 98.3 F (36.8 C) (Oral)   Ht 5\' 3"  (1.6 m)   Wt 152 lb 12.8 oz (69.3 kg)   SpO2 99%   BMI 27.07 kg/m  Gen: NAD, resting comfortably CV: RRR no murmurs rubs or gallops Lungs: CTAB no crackles, wheeze, rhonchi Abdomen: soft/nontender- other than with deep palpation in LLQ- has mild tenderness/nondistended/normal bowel sounds. No rebound or guarding.  Ext: no edema Skin: warm, dry Neuro: normal gait and speech    Assessment and Plan   Acute diverticulitis S: Patient presented to our office on 06/13/2018- started on Augmentin for diverticulitis but CT showed diverticulitis as well as potential diverticular  abscess which was 3.3 x 1.7 cm. She was sent to ER and admitted for IV antibiotics and consideration of intervention. Surgery consulted who recommended IR consultation due to location. IR recommended IV antibiotics only due to location/size/difficulty of draining this. Patient initially on zosyn but transitioned to Cipro/flagyl by time of discharge.   Patient doing well with Cipro/flagyl other than some mild nausea- which does better if she keeps something on her stomach and some mild mental fogginess. She has had continued improvement in her pain- had some pain when first got home but  that is now much better A/P: Diverticulitis appears to be improving- finish cipro/flagyl course. The bigger concern is the abscess as there was some concern in hospital about colon cancer based on appearance on CT. She has follow up with GI in 1 week to discuss potential colonoscopy and timing. We also discussed option of repeat CT scan after treatment course but patient wants to defer that conversation until she sees GI.  - patient with a lot of anxiety and questions about hospitalization and moving forward- extended counseling needed- see below  Liver lesion S: on ct scan also had incidental finding of "9 mm rounded enhancing abnormality right hepatic lobe could be consistent with a hemangioma but recommend further imaging with MRI if concern". Patient with no pain in this area A/P: we agreed to follow up with MRI liver once current diverticulitis treatment/evaluation behind [place]- Ive set a reminder to myself to reach out out to patient in 1 month to see how things stand.    Future Appointments  Date Time Provider [place]  06/28/2018  9:30 AM Willia Craze, NP [facility]  10/20/2018  9:15 AM Nicholas Lose, MD [facility] None    Lab/Order associations: Acute diverticulitis  Colonic diverticular abscess  Intra-abdominal abscess [facility])  Liver lesion  Time Stamp The duration of face-to-face time during this visit was greater than 25 minutes. Greater than 50% of this time was spent in counseling, explanation of diagnosis, planning of further management, and/or coordination of care including discussing course of hospitalization, discussing potential next steps including colonoscopy, ct, .potential surgery consult. Discussing potential for malignancy. Discussing reasons for hepatic lesion follow up.    Return precautions advised.  Garret Reddish, MD

## 2018-06-21 NOTE — Assessment & Plan Note (Signed)
S: Patient presented to our office on 06/13/2018- started on Augmentin for diverticulitis but CT showed diverticulitis as well as potential diverticular abscess which was 3.3 x 1.7 cm. She was sent to ER and admitted for IV antibiotics and consideration of intervention. Surgery consulted who recommended IR consultation due to location. IR recommended IV antibiotics only due to location/size/difficulty of draining this. Patient initially on zosyn but transitioned to Cipro/flagyl by time of discharge.   Patient doing well with Cipro/flagyl other than some mild nausea- which does better if she keeps something on her stomach and some mild mental fogginess. She has had continued improvement in her pain- had some pain when first got home but that is now much better A/P: Diverticulitis appears to be improving- finish cipro/flagyl course. The bigger concern is the abscess as there was some concern in hospital about colon cancer based on appearance on CT. She has follow up with GI in 1 week to discuss potential colonoscopy and timing. We also discussed option of repeat CT scan after treatment course but patient wants to defer that conversation until she sees GI.  - patient with a lot of anxiety and questions about hospitalization and moving forward- extended counseling needed- see below

## 2018-06-21 NOTE — Assessment & Plan Note (Signed)
S: on ct scan also had incidental finding of "9 mm rounded enhancing abnormality right hepatic lobe could be consistent with a hemangioma but recommend further imaging with MRI if concern". Patient with no pain in this area A/P: we agreed to follow up with MRI liver once current diverticulitis treatment/evaluation behind [place]- Ive set a reminder to myself to reach out out to patient in 1 month to see how things stand.

## 2018-06-21 NOTE — Patient Instructions (Addendum)
Health Maintenance Due  Topic Date Due  . PNA vac Low Risk Adult (1 of 2 - PCV13) 04/29/2018   Next big step is GI follow up  When you have received all needed information for your diverticulitis case- reach out to me and we will get your MRI of liver set up  New or worsening symptoms- please let us know immediately

## 2018-06-22 ENCOUNTER — Other Ambulatory Visit: Payer: Self-pay | Admitting: Hematology and Oncology

## 2018-06-22 MED FILL — LETROZOLE 2.5 MG TABLET: 2.5 | 90 days supply | Qty: 90 | Fill #0 | Status: TO

## 2018-06-23 ENCOUNTER — Telehealth: Payer: Self-pay | Admitting: Family Medicine

## 2018-06-23 NOTE — Telephone Encounter (Signed)
Insurance has been submitted and verified for Prolia. Patient is responsible for a $255 copay. Due anytime. Left message for patient to call back to schedule.  Okay to schedule... Visit Note: Prolia ($255 copay - okay to give per Gareth Eagle) Visit Type: Nurse Provider: Nurse  ** I am not sure if this copay is right based on both insurance coverages and that she had a $0 copay. I will double check on this and follow up with her.

## 2018-06-24 MED FILL — ZOLPIDEM TARTRATE 5 MG TAB: 5 | 30 days supply | Qty: 30 | Fill #0

## 2018-06-28 ENCOUNTER — Ambulatory Visit: Payer: Medicare Other | Admitting: Nurse Practitioner

## 2018-06-28 ENCOUNTER — Encounter: Payer: Self-pay | Admitting: Nurse Practitioner

## 2018-06-28 VITALS — BP 130/86 | HR 72 | Ht 63.0 in | Wt 151.6 lb

## 2018-06-28 DIAGNOSIS — K6389 Other specified diseases of intestine: Secondary | ICD-10-CM

## 2018-06-28 DIAGNOSIS — K5732 Diverticulitis of large intestine without perforation or abscess without bleeding: Secondary | ICD-10-CM | POA: Diagnosis not present

## 2018-06-28 NOTE — Progress Notes (Signed)
ASSESSMENT / PLAN:    87. 63 year old female recently Chandler with acute sigmoid diverticulitis complicated by a 3.3 x 1.7 cm fluid collection concerning for abscess.  Treated with IV antibiotics.  IR did not feel abscess large enough to drain.  Patient is here for follow up . Her abdominal pain has totally resolved.  -IR was concerned about colon neoplasm though CT scan report does not mention this. I will call the reading room at the hospital and ask one of the radiologist to take another look at the CT scan.  -Obtain repeat CT scan of the abdomen and pelvis with contrast.  Hopefully inflammation/abscess resolved and we can get a better look at things.  -Patient had a screening colonoscopy June 2016.  The exam was complete and bowel prep described is excellent.  No cancer/polyps, only diverticulosis found . This was patient's first episode of diverticulitis.  This, paired with some concern about a lesion in the colon certainly warrants at least a flexible sigmoidoscopy when bowel inflammation/abscess have resolved.  Timing of the procedure will be based scan findings -Slowly reintroduce fiber into diet  2. Liver lesion on CT scan.  A 9 mm enhancing right hepatic lobe lesion, could be hemangioma. -If upcoming CT scan provides no additional information about the lesions then will arrange for MRI   3. Hx of breast cancer, s/p chemotherapy. On Letrozole.    HPI:    Chief Complaint:   Hospital follow-up  Patient is a 63 year old female previously known to Dr. Olevia Perches.  She had a complete screening colonoscopy June 2016. Except for left-sided diverticulosis the exam was normal. Patient has a history of breast cancer status post chemotherapy 2017.    Patricia Chandler was Chandler late February with diverticulitis complicated by abscess. Patient saw PCP 06/13/2018 for abdominal pain.  She was started on Augmentin for diverticulitis but CT scan showed diverticular abscess and  patient was sent to the ED where she is admitted for further evaluation and treatment.  Patient was treated with IV antibiotics.  Interventional radiology evaluated but did not feel drainage of abscess was necessary.  Patient improved with antibiotics.  This was patient's first episode of diverticulitis.  Her symptoms have completely improved.  She would like to start reincorporating fiber into her diet.   Past Medical History:  Diagnosis Date  . Anemia    due to chemo  . Anxiety   . Breast cancer ([place])   . Cardiomyopathy ([place])    mild, per cardiology note; due to Herceptin  . Dental crowns present   . Family history of adverse reaction to anesthesia    pt's daughter has hx. of post-op nausea  . GERD (gastroesophageal reflux disease)   . History of breast cancer 2017   right  . History of chemotherapy    finished chemo 04/15/2016  . Personal history of chemotherapy   . Rash 08/04/2016   under right arm - states recent tick bite; to see MD 08/05/2016  . Runny nose 06/06/2016   clear drainage, per pt.     Past Surgical History:  Procedure Laterality Date  . AUGMENTATION MAMMAPLASTY    . BREAST ENHANCEMENT SURGERY Bilateral 2005  . BREAST RECONSTRUCTION WITH PLACEMENT OF TISSUE EXPANDER AND FLEX HD (ACELLULAR HYDRATED DERMIS) Right 05/19/2016   Procedure: RIGHT BREAST RECONSTRUCTION WITH PLACEMENT OF TISSUE EXPANDER AND  ALLODERM.;  Surgeon: Irene Limbo, MD;  Location: [facility];  Service: Plastics;  Laterality: Right;  . COLONOSCOPY    . COLONOSCOPY WITH PROPOFOL  10/02/2014  . DEBRIDEMENT AND CLOSURE WOUND Right 06/08/2016   Procedure: DEBRIDEMENT RIGHT MASTECTOMY FLAP, TISSUE EXPANSION OF RIGHT CHEST;  Surgeon: Irene Limbo, MD;  Location: [place];  Service: Plastics;  Laterality: Right;  . MASTECTOMY    . MASTOPEXY Left 08/11/2016   Procedure: LEFT BREAST MASTOPEXY;  Surgeon: Irene Limbo, MD;  Location: [place];  Service: Plastics;   Laterality: Left;  . NIPPLE SPARING MASTECTOMY/SENTINAL LYMPH NODE BIOPSY/RECONSTRUCTION/PLACEMENT OF TISSUE EXPANDER Right 05/19/2016   Procedure: RIGHT NIPPLE SPARING MASTECTOMY WITH SENTINAL LYMPH NODE BIOPSY WITH BLUE DYE INJECTION;  Surgeon: Rolm Bookbinder, MD;  Location: [place];  Service: General;  Laterality: Right;  . PORTACATH PLACEMENT N/A 12/30/2015   Procedure: INSERTION PORT-A-CATH WITH [place];  Surgeon: Rolm Bookbinder, MD;  Location: [place];  Service: General;  Laterality: N/A;  . REDUCTION MAMMAPLASTY    . REMOVAL OF TISSUE EXPANDER AND PLACEMENT OF IMPLANT Right 08/11/2016   Procedure: REMOVAL OF RIGHT  TISSUE EXPANDER AND PLACEMENT OF IMPLANT;  Surgeon: Irene Limbo, MD;  Location: [place];  Service: Plastics;  Laterality: Right;   Family History  Problem Relation Age of Onset  . Breast cancer Maternal Aunt        dx in her 33s  . Stroke Father        late 39s  . Hypertension Father   . Pulmonary fibrosis Mother   . Breast cancer Paternal Aunt 78  . Cancer Paternal Grandmother        abdominal; cervical vs uterine dx in her 45s  . Parkinson's disease Maternal Grandfather   . Anesthesia problems Daughter        post-op nausea   Social History   Tobacco Use  . Smoking status: Never Smoker  . Smokeless tobacco: Never Used  Substance Use Topics  . Alcohol use: Yes    Comment: occasionally  . Drug use: No   Current Outpatient Medications  Medication Sig Dispense Refill  . Cyanocobalamin (VITAMIN B-12 PO) Take 1 tablet by mouth daily.    Marland Kitchen denosumab (PROLIA) 60 MG/ML SOLN injection Inject 60 mg into the skin every 6 (six) months. Administer in upper arm, thigh, or abdomen    . letrozole (FEMARA) 2.5 MG tablet Take 1 tablet (2.5 mg total) by mouth daily. 90 tablet 3  . Misc Natural Products (TURMERIC CURCUMIN) CAPS Take 1 capsule by mouth daily.    Marland Kitchen saccharomyces boulardii (FLORASTOR) 250 MG capsule Take 1  capsule (250 mg total) by mouth 2 (two) times daily. 60 capsule 1  . Vitamin D, Ergocalciferol, (DRISDOL) 50000 units CAPS capsule Take 1 capsule (50,000 Units total) by mouth every 7 (seven) days. 12 capsule 0  . zolpidem (AMBIEN) 5 MG tablet TAKE 1 TABLET BY MOUTH AT BEDTIME AS NEEDED FOR SLEEP 30 tablet 0   No current facility-administered medications for this visit.    No Known Allergies   Review of Systems: All systems reviewed and negative except where noted in HPI.   Serum creatinine: 0.67 mg/dL 06/16/18 0418 Estimated creatinine clearance: 65.3 mL/min   Physical Exam:    Wt Readings from Last 3 Encounters:  06/28/18 151 lb 9.6 oz (68.8 kg)  06/21/18 152 lb 12.8 oz (69.3 kg)  06/14/18 150 lb (68 kg)    BP 130/86   Pulse 72   Ht 5\' 3"  (1.6  m)   Wt 151 lb 9.6 oz (68.8 kg)   BMI 26.85 kg/m  Constitutional:  Pleasant female in no acute distress. Psychiatric: Normal mood and affect. Behavior is normal. EENT: Pupils normal.  Conjunctivae are normal. No scleral icterus. Neck supple.  Cardiovascular: Normal rate, regular rhythm. No edema Pulmonary/chest: Effort normal and breath sounds normal. No wheezing, rales or rhonchi. Abdominal: Soft, nondistended, nontender. Bowel sounds active throughout. There are no masses palpable. No hepatomegaly. Neurological: Alert and oriented to person place and time. Skin: Skin is warm and dry. No rashes noted.  Tye Savoy, NP  06/28/2018, 9:39 AM  Cc: [name], MD

## 2018-06-28 NOTE — Patient Instructions (Addendum)
If you are age 65 or older, your body mass index should be between 23-30. Your Body mass index is 26.85 kg/m. If this is out of the aforementioned range listed, please consider follow up with your Primary Care Provider.  If you are age 17 or younger, your body mass index should be between 19-25. Your Body mass index is 26.85 kg/m. If this is out of the aformentioned range listed, please consider follow up with your Primary Care Provider.   You have been scheduled for a CT scan of the abdomen and pelvis at [facility] on [address]  You are scheduled on 07/01/18 at 10 am  . You should arrive 20 minutes prior to your appointment time for registration. Please follow the written instructions below on the day of your exam:  WARNING: IF YOU ARE ALLERGIC TO IODINE/X-RAY DYE, PLEASE NOTIFY RADIOLOGY IMMEDIATELY AT [phone]! YOU WILL BE GIVEN A 13 HOUR PREMEDICATION PREP.  1) Do not eat or drink anything after 6 am (4 hours prior to your test) 2) You have been given 2 bottles of oral contrast to drink. The solution may taste better if refrigerated, but do NOT add ice or any other liquid to this solution. Shake well before drinking.    Drink 1 bottle of contrast @ 8 am  (2 hours prior to your exam)  Drink 1 bottle of contrast @ 9 am (1 hour prior to your exam)  You may take any medications as prescribed with a small amount of water, if necessary. If you take any of the following medications: METFORMIN, GLUCOPHAGE, GLUCOVANCE, AVANDAMET, RIOMET, FORTAMET, [place] MET, JANUMET, GLUMETZA or METAGLIP, you MAY be asked to HOLD this medication 48 hours AFTER the exam.  The purpose of you drinking the oral contrast is to aid in the visualization of your intestinal tract. The contrast solution may cause some diarrhea. Depending on your individual set of symptoms, you may also receive an intravenous injection of x-ray contrast/dye. Plan on being at [facility] for 30 minutes or longer,  depending on the type of exam you are having performed.  This test typically takes 30-45 minutes to complete.  If you have any questions regarding your exam or if you need to reschedule, you may call the CT department at [phone].  ________________________________________________________________  We will call you with results.    Thank you for choosing me and [facility].   Tye Savoy, NP

## 2018-06-29 NOTE — Progress Notes (Signed)
Agree with assessment and plan as outlined. Unclear if this represents complicated diverticulitis versus mass lesion on imaging, agree with repeat CT scan to ensure if she did have an abscess that is resolving. She will need colonoscopy to further evaluate CT findings, will await imaging first.

## 2018-06-30 ENCOUNTER — Telehealth: Payer: Self-pay | Admitting: Nurse Practitioner

## 2018-06-30 NOTE — Telephone Encounter (Signed)
Pt needs to pick up her prep for her Ct scan she has tomorrow 3-13. Would like to know if she can pick it up here.

## 2018-07-01 ENCOUNTER — Ambulatory Visit
Admission: RE | Admit: 2018-07-01 | Discharge: 2018-07-01 | Disposition: A | Discharge status: Home / Self Care | Payer: Medicare Other | Source: Ambulatory Visit | Attending: Nurse Practitioner | Admitting: Nurse Practitioner

## 2018-07-01 ENCOUNTER — Other Ambulatory Visit: Payer: Self-pay

## 2018-07-01 DIAGNOSIS — K5780 Diverticulitis of intestine, part unspecified, with perforation and abscess without bleeding: Secondary | ICD-10-CM | POA: Diagnosis not present

## 2018-07-01 DIAGNOSIS — K6389 Other specified diseases of intestine: Secondary | ICD-10-CM

## 2018-07-01 DIAGNOSIS — K5730 Diverticulosis of large intestine without perforation or abscess without bleeding: Secondary | ICD-10-CM | POA: Diagnosis not present

## 2018-07-01 DIAGNOSIS — K5732 Diverticulitis of large intestine without perforation or abscess without bleeding: Secondary | ICD-10-CM

## 2018-07-01 MED ORDER — IOPAMIDOL (ISOVUE-300) INJECTION 61%
100.0000 mL | Freq: Once | INTRAVENOUS | Status: AC | PRN
  Administered 2018-07-01: 100 mL via INTRAVENOUS

## 2018-07-05 ENCOUNTER — Other Ambulatory Visit: Payer: Self-pay

## 2018-07-05 ENCOUNTER — Ambulatory Visit (INDEPENDENT_AMBULATORY_CARE_PROVIDER_SITE_OTHER): Payer: Medicare Other

## 2018-07-05 ENCOUNTER — Encounter: Payer: Self-pay | Admitting: Family Medicine

## 2018-07-05 DIAGNOSIS — M810 Age-related osteoporosis without current pathological fracture: Secondary | ICD-10-CM | POA: Diagnosis not present

## 2018-07-05 MED ORDER — DENOSUMAB 60 MG/ML ~~LOC~~ SOSY
60.0000 mg | PREFILLED_SYRINGE | Freq: Once | SUBCUTANEOUS | Status: AC
  Administered 2018-07-05: 60 mg via SUBCUTANEOUS

## 2018-07-13 ENCOUNTER — Ambulatory Visit: Payer: Medicare Other | Admitting: Family Medicine

## 2018-08-02 ENCOUNTER — Telehealth: Payer: Self-pay | Admitting: Nurse Practitioner

## 2018-08-02 NOTE — Telephone Encounter (Signed)
Pt called said she was told that she should have a MRI done after her CT scan but COVID was starting around the time she had her apt with Rubbie Battiest and did not hear back. She would like to know if she needs to schedule the MRI.

## 2018-08-02 NOTE — Telephone Encounter (Signed)
Left message for pt to call back  °

## 2018-08-02 NOTE — Telephone Encounter (Signed)
Thanks for the update Patricia Chandler.  I reviewed her CT scan with Dr. Laqueta Carina of radiology. He sees the hemangioma on the CT he read (her most recent exam) and does not think it warrants a follow up MRI. Please reassure her the hemangioma is benign and not likely to cause any problems and based on this discussion does not warrant a follow up MRI.  As for her colonoscopy, I do think she warrants a follow up colonoscopy, however her recent CT argues against any mass in the area. If she is feeling okay without any residual problems, I think it would be okay to wait a few more weeks and perhaps do this in May. If she is having some pain or concerned about waiting a few more weeks we can do it sooner however with the covid-19 outbreak, more risky to do it now. Can you let me know. Thanks

## 2018-08-02 NOTE — Telephone Encounter (Signed)
Pt states she was to have an MRI done that was recommended by Tye Savoy NP and her PCP. She also states she is supposed to have a colon done. Pt wants to know how soon she should have both of these done. With covid-19 she would like to know how long these should wait. Please advise.  Notes recorded by Yetta Flock, MD on 07/04/2018 at 7:29 AM EDT Thanks Nevin Bloodgood. No evidence of any mass on CT scan. I reviewed her last colonoscopy from 2016, about 3.5 years ago, she had no polyps and only left sided diverticulosis. Given acute symptoms that resolved with antibiotics, and this CT and prior colonoscopy results, I suspect she likely did have diverticulitis and that colon cancer is less likely, but does warrant a colonoscopy to make sure. I would plan on doing this in mid April, about 6 weeks out from her initial CT scan, if she is otherwise doing okay. Can you please let her know and coordinate scheduling. Thanks ------  Notes recorded by Willia Craze, NP on 07/02/2018 at 2:58 PM EDT Called patient today with results . Diverticulitis / abscess resolved and no evidence for colon mass. I will forward to Dr. Havery Moros to see if he still wants to proceed with colonoscopy given one of the IR docs concern for colon mass During recent hospital admission for diverticulitis / abscess. Other than that shw should get MRI to better characterize liver lesion on CTscan

## 2018-08-03 NOTE — Telephone Encounter (Signed)
Spoke with pt and she is aware and is not having any symptoms right now. She is ok waiting until June for the colon if Dr. Havery Moros feels this is ok.   For the MRI the patient is still concerned due to the CT that was done in February. Pt is a cancer survivor and she wants to make absolutely sure there is nothing to be concerned about. States if the radiologist is 100% sure it is just a hemangioma then she is ok not having the MRI. The CT in Feb mentioned cyst in liver that needed further imaging. Please advise.

## 2018-08-04 NOTE — Telephone Encounter (Signed)
Left pt detailed message regarding Dr. Ozella Rocks comments below.

## 2018-08-04 NOTE — Telephone Encounter (Signed)
Per Dr. Laqueta Carina of radiology whom I discussed this case and reviewed images with, the most recent CT scan got a better look at the liver in regards to timing of the contrast and he is confident the lesion in the liver on CT scan is a benign hemangioma. He thought MRI was not indicated given this result, as the MRI is costly and will likely not show anything different. I trust his opinion and don't think the follow up MRI is needed.  Can you have her call back in a month or so in regards to the colonoscopy, we may have a better idea about timing of elective procedures. I think planning on doing colonoscopy in early June is reasonable but would not not to push it out much further if possible. If we resume procedures in May by any chance, we can proceed at that time. Thanks

## 2018-08-16 ENCOUNTER — Other Ambulatory Visit: Payer: Self-pay | Admitting: Hematology and Oncology

## 2018-08-16 MED ORDER — ZOLPIDEM TARTRATE 5 MG PO TABS: 5.0000 mg | ORAL_TABLET | Freq: Every evening | ORAL | 2 refills | Status: DC | PRN

## 2018-08-16 MED FILL — ZOLPIDEM TARTRATE 5 MG TAB: 5 | 30 days supply | Qty: 30 | Fill #0 | Status: TO

## 2018-08-23 NOTE — Telephone Encounter (Signed)
Didn't know where this patient was on your  Priority list ? Please advise

## 2018-08-23 NOTE — Telephone Encounter (Signed)
I think we can go ahead and book for May, that should be fine. Thanks

## 2018-08-23 NOTE — Telephone Encounter (Signed)
Patient would like to know if she can go ahead and schedule a colonoscopy.

## 2018-08-24 NOTE — Telephone Encounter (Signed)
Patient scheduled for Colon in [place] 09/09/18 @ 3:00pm  and pre-visit (in person) 09/06/18 @9 :45am. Patient called and given appts.

## 2018-09-06 ENCOUNTER — Telehealth: Payer: Self-pay | Admitting: Gastroenterology

## 2018-09-06 ENCOUNTER — Ambulatory Visit: Payer: Medicare Other

## 2018-09-06 ENCOUNTER — Other Ambulatory Visit: Payer: Self-pay

## 2018-09-06 ENCOUNTER — Encounter: Payer: Self-pay | Admitting: Gastroenterology

## 2018-09-06 VITALS — Ht 63.0 in | Wt 147.0 lb

## 2018-09-06 DIAGNOSIS — K6389 Other specified diseases of intestine: Secondary | ICD-10-CM

## 2018-09-06 DIAGNOSIS — K5732 Diverticulitis of large intestine without perforation or abscess without bleeding: Secondary | ICD-10-CM

## 2018-09-06 MED ORDER — NA SULFATE-K SULFATE-MG SULF 17.5-3.13-1.6 GM/177ML PO SOLN: 1.0000 | Freq: Once | ORAL | 0 refills | Status: AC

## 2018-09-06 NOTE — Telephone Encounter (Signed)
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 I  Will offer a sample Suprep- Pt will pick up 3rd floor Wednesday and she was very happy and appreciative of the sample for her. She is aware she needs to wear a mask to P/U her prep WED. On 3rd floor .  Suprep Lot [id number]  Exp 05/2020 as directed

## 2018-09-06 NOTE — Progress Notes (Signed)
No egg or soy allergy known to patient  No issues with past sedation with any surgeries  or procedures, no intubation problems  No diet pills per patient No home 02 use per patient  No blood thinners per patient  Pt denies issues with constipation  No A fib or A flutter  EMMI video sent to pt's e mail , declined  

## 2018-09-06 NOTE — Telephone Encounter (Signed)
Pt states that price for prep is too expensive, she wants to know what else she can get that is more affordable. Pls call her.

## 2018-09-07 ENCOUNTER — Telehealth: Payer: Self-pay | Admitting: *Deleted

## 2018-09-07 NOTE — Telephone Encounter (Signed)
Covid-19 travel screening questions  Have you traveled in the last 14 days?no If yes where?  Do you now or have you had a fever in the last 14 days?no  Do you have any respiratory symptoms of shortness of breath or cough now or in the last 14 days?no  Do you have a medical history of Congestive Heart Failure?  Do you have a medical history of lung disease?  Do you have any family members or close contacts with diagnosed or suspected Covid-19?no  PT aware of care partner policy and will bring a mask with her. Sm

## 2018-09-09 ENCOUNTER — Ambulatory Visit (AMBULATORY_SURGERY_CENTER): Payer: Medicare Other | Admitting: Gastroenterology

## 2018-09-09 ENCOUNTER — Encounter: Payer: Self-pay | Admitting: Gastroenterology

## 2018-09-09 ENCOUNTER — Other Ambulatory Visit: Payer: Self-pay

## 2018-09-09 VITALS — BP 128/78 | HR 77 | Temp 98.5°F | Resp 15 | Ht 63.0 in | Wt 147.0 lb

## 2018-09-09 DIAGNOSIS — K648 Other hemorrhoids: Secondary | ICD-10-CM

## 2018-09-09 DIAGNOSIS — Z8719 Personal history of other diseases of the digestive system: Secondary | ICD-10-CM | POA: Diagnosis not present

## 2018-09-09 DIAGNOSIS — D125 Benign neoplasm of sigmoid colon: Secondary | ICD-10-CM | POA: Diagnosis not present

## 2018-09-09 DIAGNOSIS — K5732 Diverticulitis of large intestine without perforation or abscess without bleeding: Secondary | ICD-10-CM | POA: Diagnosis not present

## 2018-09-09 DIAGNOSIS — K635 Polyp of colon: Secondary | ICD-10-CM

## 2018-09-09 MED ORDER — SODIUM CHLORIDE 0.9 % IV SOLN: 500.0000 mL | Freq: Once | INTRAVENOUS | Status: DC

## 2018-09-09 NOTE — Progress Notes (Signed)
Temperature taken by Emmit Alexanders, CMA, VS taken by J.B., CMA

## 2018-09-09 NOTE — Progress Notes (Signed)
Pt's states no medical or surgical changes since previsit or office visit. 

## 2018-09-09 NOTE — Progress Notes (Signed)
Report to PACU, RN, vss, BBS= Clear.  

## 2018-09-09 NOTE — Progress Notes (Signed)
Called to room to assist during endoscopic procedure.  Patient ID and intended procedure confirmed with present staff. Received instructions for my participation in the procedure from the performing physician.  

## 2018-09-09 NOTE — Patient Instructions (Signed)
Thank you for allowing [place] to care for you today!  Await pathology results of polyp, approximately 2 weeks.  Recommendation for next colonoscopy will be made at that time.  Resume previous diet and medications today.  Return to normal activities tomorrow.      YOU HAD AN ENDOSCOPIC PROCEDURE TODAY AT [facility]:   Refer to the procedure report that was given to you for any specific questions about what was found during the examination.  If the procedure report does not answer your questions, please call your gastroenterologist to clarify.  If you requested that your care partner not be given the details of your procedure findings, then the procedure report has been included in a sealed envelope for you to review at your convenience later.  YOU SHOULD EXPECT: Some feelings of bloating in the abdomen. Passage of more gas than usual.  Walking can help get rid of the air that was put into your GI tract during the procedure and reduce the bloating. If you had a lower endoscopy (such as a colonoscopy or flexible sigmoidoscopy) you may notice spotting of blood in your stool or on the toilet paper. If you underwent a bowel prep for your procedure, you may not have a normal bowel movement for a few days.  Please Note:  You might notice some irritation and congestion in your nose or some drainage.  This is from the oxygen used during your procedure.  There is no need for concern and it should clear up in a day or so.  SYMPTOMS TO REPORT IMMEDIATELY:   Following lower endoscopy (colonoscopy or flexible sigmoidoscopy):  Excessive amounts of blood in the stool  Significant tenderness or worsening of abdominal pains  Swelling of the abdomen that is new, acute  Fever of 100F or higher   For urgent or emergent issues, a gastroenterologist can be reached at any hour by calling [phone].   DIET:  We do recommend a small meal at first, but then you may proceed to your regular diet.   Drink plenty of fluids but you should avoid alcoholic beverages for 24 hours.  ACTIVITY:  You should plan to take it easy for the rest of today and you should NOT DRIVE or use heavy machinery until tomorrow (because of the sedation medicines used during the test).    FOLLOW UP: Our staff will call the number listed on your records 48-72 hours following your procedure to check on you and address any questions or concerns that you may have regarding the information given to you following your procedure. If we do not reach you, we will leave a message.  We will attempt to reach you two times.  During this call, we will ask if you have developed any symptoms of COVID 19. If you develop any symptoms (ie: fever, flu-like symptoms, shortness of breath, cough etc.) before then, please call [phone].  If you test positive for Covid 19 in the 2 weeks post procedure, please call and report this information to [place].    If any biopsies were taken you will be contacted by phone or by letter within the next 1-3 weeks.  Please call us at [phone] if you have not heard about the biopsies in 3 weeks.    SIGNATURES/CONFIDENTIALITY: You and/or your care partner have signed paperwork which will be entered into your electronic medical record.  These signatures attest to the fact that that the information above on your After Visit Summary has  been reviewed and is understood.  Full responsibility of the confidentiality of this discharge information lies with you and/or your care-partner.

## 2018-09-09 NOTE — Op Note (Signed)
[facility] Patient Name: Patricia Chandler Procedure Date: 09/09/2018 2:39 PM MRN: [id number] Endoscopist: Remo Lipps P. Havery Moros , MD Age: 63 Referring MD:  Date of Birth: [birth year] Gender: Female Account #: [id number] Procedure:                Colonoscopy Indications:              Abnormal CT of the GI tract - history of suspected                            perforating sigmoid diverticulitis in February,                            rule out mass lesion Medicines:                Monitored Anesthesia Care Procedure:                Pre-Anesthesia Assessment:                           - Prior to the procedure, a History and Physical                            was performed, and patient medications and                            allergies were reviewed. The patient's tolerance of                            previous anesthesia was also reviewed. The risks                            and benefits of the procedure and the sedation                            options and risks were discussed with the patient.                            All questions were answered, and informed consent                            was obtained. Prior Anticoagulants: The patient has                            taken no previous anticoagulant or antiplatelet                            agents. ASA Grade Assessment: II - A patient with                            mild systemic disease. After reviewing the risks                            and benefits, the patient was deemed in  satisfactory condition to undergo the procedure.                           After obtaining informed consent, the colonoscope                            was passed under direct vision. Throughout the                            procedure, the patient's blood pressure, pulse, and                            oxygen saturations were monitored continuously. The                            Model [id number]-[id number])  scope was introduced                            through the anus and advanced to the the cecum,                            identified by appendiceal orifice and ileocecal                            valve. The colonoscopy was performed without                            difficulty. The patient tolerated the procedure                            well. The quality of the bowel preparation was                            good. The ileocecal valve, appendiceal orifice, and                            rectum were photographed. Scope In: 2:52:11 PM Scope Out: 3:10:22 PM Scope Withdrawal Time: 0 hours 13 minutes 47 seconds  Total Procedure Duration: 0 hours 18 minutes 11 seconds  Findings:                 The perianal and digital rectal examinations were                            normal.                           Scattered medium-mouthed diverticula were found in                            the entire colon.                           A 3 to 4 mm polyp was found in the sigmoid colon.  The polyp was sessile. The polyp was removed with a                            cold snare. Resection and retrieval were complete.                           Internal hemorrhoids were found during                            retroflexion. The hemorrhoids were moderate.                           The exam was otherwise without abnormality. Complications:            No immediate complications. Estimated blood loss:                            Minimal. Estimated Blood Loss:     Estimated blood loss was minimal. Impression:               - Diverticulosis in the entire examined colon.                           - One 3 to 4 mm polyp in the sigmoid colon, removed                            with a cold snare. Resected and retrieved.                           - Internal hemorrhoids.                           - The examination was otherwise normal. Recommendation:           - Patient has a contact number  available for                            emergencies. The signs and symptoms of potential                            delayed complications were discussed with the                            patient. Return to normal activities tomorrow.                            Written discharge instructions were provided to the                            patient.                           - Resume previous diet.                           - Continue present medications.                           -  Await pathology results. Remo Lipps P. Audrie Kuri, MD 09/09/2018 3:17:00 PM This report has been signed electronically.

## 2018-09-13 ENCOUNTER — Telehealth: Payer: Self-pay

## 2018-09-13 NOTE — Telephone Encounter (Signed)
First follow up call attempt to pt, left message if there are any problems to give [place] a call, otherwise we will try back after 12.

## 2018-09-13 NOTE — Telephone Encounter (Signed)
  Follow up Call-  Call back number 09/09/2018  Post procedure Call Back phone  # [phone]  Permission to leave phone message Yes  Some recent data might be hidden     Patient questions:  Do you have a fever, pain , or abdominal swelling? No. Pain Score  0 *  Have you tolerated food without any problems? Yes.    Have you been able to return to your normal activities? Yes.    Do you have any questions about your discharge instructions: Diet   No. Medications  No. Follow up visit  No.  Do you have questions or concerns about your Care? No.  Actions: * If pain score is 4 or above: No action needed, pain <4. 1. Have you developed a fever since your procedure? no  2.   Have you had an respiratory symptoms (SOB or cough) since your procedure? no  3.   Have you tested positive for COVID 19 since your procedure no  4.   Have you had any family members/close contacts diagnosed with the COVID 19 since your procedure?  no   If any of these questions are a yes, please inquire if patient has been seen by family doctor and route this note to Joylene John, Therapist, sports.

## 2018-10-13 NOTE — Assessment & Plan Note (Signed)
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 Genetic counseling: Normal did not reveal any abnormal mutations 2. Neoadjuvant chemotherapy. [facility] Perjeta 6 cycles followed by Herceptin And Perjetamaintenance completed 10/20/2016) from 12/31/15 to 04/15/16 3. Right mastectomy with reconstruction: 05/19/2016:Right mastectomy: IDC grade 3, 1.3 cm, low-grade DCIS, LVIDS present, margins negative, 0/1 lymph node negative, ypT1CypN0 stage IA; repeat HER-2 negative ratio 1.41 4. Adjuvant antiestrogen therapy With letrozole 2.5 mg daily to be started March 2018 -------------------------------------------------------------------------------------------------------------------------------------------------- Current treatment: 1. Antiestrogen therapy with letrozole 2.5 mg daily started March 2018 2. Consideration for Neratinib: Patient did not want to receive Neratinib  Bone density 01/04/2017: T score -2.7 osteoporosis Osteoporosis: Patient will need bisphosphonate therapy.  Breast cancer surveillance: 1.  Breast exam 10/19/2017: Benign right reconstructed breast with artificial nipple made from silicone (naturally impressive is the name of the product) 2. mammogram 04/22/2018: Right mastectomy, benign breast density category C  Return to clinic in 1 year

## 2018-10-14 ENCOUNTER — Telehealth: Payer: Self-pay | Admitting: Hematology and Oncology

## 2018-10-14 NOTE — Telephone Encounter (Signed)
Per Md patient is coming into the office, she Is having pain

## 2018-10-19 NOTE — Progress Notes (Signed)
Patient Care Team: [name], MD as PCP - General (Family Medicine)  DIAGNOSIS:    ICD-10-CM   1. 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 breast in female, estrogen receptor positive ([place])  C50.211    Z17.0     SUMMARY OF ONCOLOGIC HISTORY: Oncology History  Breast cancer of upper-inner quadrant of right female breast ([place])  12/05/2015 Mammogram   Right breast mass 2:00 4 cm from nipple: 1.7 cm, T1c N0 stage IA; right breast 2:00 6 cm from nipple 1.3 cm mass, 7 mm satellite nodule between the 2 masses, T1 cN0 stage IA   12/06/2015 Initial Diagnosis   Right breast biopsy 2:00 6 cm from nipple: Grade 2-3 invasive ductal cancer, ER 90%, PR 2%, Ki-67 80%, HER-2 positive ratio 2.07; right biopsy 2:00 4 cm from nipple: IDC with DCIS, ER 95%, PR 10%, Ki-67 90%, HER-2 negative ratio 1.27   12/18/2015 Breast MRI   3 enhancing masses in the upper inner quadrant right breast spanning an area 3.7 cm (1.7 cm, 1 cm, 1.4 cm) no lymph node enlargement   12/31/2015 - 04/15/2016 Neo-Adjuvant Chemotherapy   [facility] Perjeta 6 cycles followed by Herceptin, Perjeta maintenance for 1 year completed 10/20/2016   01/08/2016 - 01/12/2016 Hospital Admission   Neutropenic fever hospitalization (because patient did not receive Neulasta with cycle 1)   01/10/2016 Miscellaneous   Genetic testing was normal did not reveal any mutations   04/16/2016 Breast MRI   Near CR to therapy. Previous 2 biopsied massses are no longer seen. Third satellite lesion is smaller from 10 mm to 5.3 mm, no abnormal LN.   05/19/2016 Surgery   Right mastectomy: IDC grade 3, 1.3 cm, low-grade DCIS, LVIDS present, margins negative, 0/1 lymph node negative, ypT1CypN0 stage IA; repeat HER-2 negative ratio 1.41   05/19/2016 Surgery   Right breast reconstruction with tissue expander. Acellular dermis for breast reconstruction (Dr.Thimappa)    06/22/2016 -  Anti-estrogen oral therapy   Letrozole 2.5 mg daily     CHIEF  COMPLIANT: Follow-up on letrozole therapy  INTERVAL HISTORY: Patricia Chandler is a 63 y.o. with above-mentioned history of right breast cancer treated neoadjuvant chemotherapy, mastectomy with right breast reconstruction, and who is currently on letrozole therapy. I last saw her a year ago. Mammogram of her left breast on 04/22/18 showed no evidence of malignancy. She presents to the clinic today for annual follow-up.  She has felt a palpable lump in the right breast upper inner quadrant around the surgical scar tissue of the reconstructed breast.  This is been very tender and uncomfortable and the lumpiness or nodularity is slightly worse than before.  Because of this she wanted to come and be checked out.  REVIEW OF SYSTEMS:   Constitutional: Denies fevers, chills or abnormal weight loss Eyes: Denies blurriness of vision Ears, nose, mouth, throat, and face: Denies mucositis or sore throat Respiratory: Denies cough, dyspnea or wheezes Cardiovascular: Denies palpitation, chest discomfort Gastrointestinal: Denies nausea, heartburn or change in bowel habits Skin: Denies abnormal skin rashes Lymphatics: Denies new lymphadenopathy or easy bruising Neurological: Denies numbness, tingling or new weaknesses Behavioral/Psych: Mood is stable, no new changes  Extremities: No lower extremity edema Breast: Right reconstructed breast, nodularity in the upper inner quadrant of the right breast All other systems were reviewed with the patient and are negative.  I have reviewed the past medical history, past surgical history, social history and family history with the patient and they are unchanged from previous note.  ALLERGIES:  has No Known Allergies.  MEDICATIONS:  Current Outpatient Medications  Medication Sig Dispense Refill  . Cyanocobalamin (VITAMIN B-12 PO) Take 1 tablet by mouth daily.    Marland Kitchen denosumab (PROLIA) 60 MG/ML SOLN injection Inject 60 mg into the skin every 6 (six) months. Administer  in upper arm, thigh, or abdomen    . Fish Oil-Cholecalciferol (OMEGA-3 + VITAMIN D3 PO) Take by mouth daily.    . Lactobacillus (PROBIOTIC ACIDOPHILUS PO) Take by mouth daily.    Marland Kitchen letrozole (FEMARA) 2.5 MG tablet Take 1 tablet (2.5 mg total) by mouth daily. 90 tablet 3  . Misc Natural Products (TURMERIC CURCUMIN) CAPS Take 1 capsule by mouth daily.    Marland Kitchen zolpidem (AMBIEN) 5 MG tablet Take 1 tablet (5 mg total) by mouth at bedtime as needed. for sleep 30 tablet 2   No current facility-administered medications for this visit.     PHYSICAL EXAMINATION: ECOG PERFORMANCE STATUS: 1 - Symptomatic but completely ambulatory  Vitals:   10/20/18 0932  BP: (!) 129/92  Pulse: 84  Resp: 17  Temp: 98 F (36.7 C)  SpO2: 99%   Filed Weights   10/20/18 0932  Weight: 154 lb 12.8 oz (70.2 kg)    GENERAL: alert, no distress and comfortable SKIN: skin color, texture, turgor are normal, no rashes or significant lesions EYES: normal, Conjunctiva are pink and non-injected, sclera clear OROPHARYNX: no exudate, no erythema and lips, buccal mucosa, and tongue normal  NECK: supple, thyroid normal size, non-tender, without nodularity LYMPH: no palpable lymphadenopathy in the cervical, axillary or inguinal LUNGS: clear to auscultation and percussion with normal breathing effort HEART: regular rate & rhythm and no murmurs and no lower extremity edema ABDOMEN: abdomen soft, non-tender and normal bowel sounds MUSCULOSKELETAL: no cyanosis of digits and no clubbing  NEURO: alert & oriented x 3 with fluent speech, no focal motor/sensory deficits EXTREMITIES: No lower extremity edema BREAST: Palpable lumpiness in the upper inner quadrant of the right breast measuring 3 cm in size along the medial aspect of the surgical scar tissue   (exam performed in the presence of a chaperone)  LABORATORY DATA:  I have reviewed the data as listed CMP Latest Ref Rng & Units 06/16/2018 06/15/2018 06/14/2018  Glucose 70 - 99  mg/dL 90 99 97  BUN 8 - 23 mg/dL _0 Creatinine 0.44 - 1.00 mg/dL 0.67 0.74 0.76  Sodium 135 - 145 mmol/L 138 138 136  Potassium 3.5 - 5.1 mmol/L 3.9 4.6 3.0(L)  Chloride 98 - 111 mmol/L 113(H) 111 102  CO2 22 - 32 mmol/L 18(L) 22 26  Calcium 8.9 - 10.3 mg/dL 7.9(L) 7.8(L) 8.6(L)  Total Protein 6.5 - 8.1 g/dL - 6.1(L) 7.6  Total Bilirubin 0.3 - 1.2 mg/dL - 0.8 0.8  Alkaline Phos 38 - 126 U/L - 53 64  AST 15 - 41 U/L - 16 20  ALT 0 - 44 U/L - 15 15    Lab Results  Component Value Date   WBC 6.5 06/16/2018   HGB 12.0 06/16/2018   HCT 37.8 06/16/2018   MCV 92.6 06/16/2018   PLT 245 06/16/2018   NEUTROABS 6.5 06/14/2018    ASSESSMENT & PLAN:  Breast cancer of upper-inner quadrant of right female breast ([facility]) Right breast biopsy 2:00 6 cm from nipple: Grade 2-3 invasive ductal cancer, ER 90%, PR 2%, Ki-67 80%, HER-2 positive ratio 2.07; right biopsy 2:00 4 cm from nipple: IDC with DCIS, ER 95%, PR 10%, Ki-67  90%, HER-2 negative ratio 1.27 Breast MRI08/30/2017: 3 enhancing masses in the upper inner quadrant right breast spanning an area 3.7 cm (1.7 cm, 1 cm, 1.4 cm) no lymph node enlargement Clinical stage: T2N0 (stage 2A)  Treatment plan: 1. Genetic counseling: Normal did not reveal any abnormal mutations 2. Neoadjuvant chemotherapy. [facility] Perjeta 6 cycles followed by Herceptin And Perjetamaintenance completed 10/20/2016) from 12/31/15 to 04/15/16 3. Right mastectomy with reconstruction: 05/19/2016:Right mastectomy: IDC grade 3, 1.3 cm, low-grade DCIS, LVIDS present, margins negative, 0/1 lymph node negative, ypT1CypN0 stage IA; repeat HER-2 negative ratio 1.41 4. Adjuvant antiestrogen therapy With letrozole 2.5 mg daily to be started March 2018 -------------------------------------------------------------------------------------------------------------------------------------------------- Current treatment: 1. Antiestrogen therapy with letrozole 2.5 mg daily started March 2018  2. Consideration for Neratinib: Patient did not want to receive Neratinib  Bone density 01/04/2017: T score -2.7 osteoporosis Osteoporosis: Patient will need bisphosphonate therapy.  Breast cancer surveillance: 1.  Breast exam 10/20/2018: Palpable area of nodularity in the right breast upper inner quadrant alongside the scar tissue which is tender to palpation We will obtain an ultrasound and possibly biopsy of this area. I will call her with the result of this test. 2.   Left breast mammogram 04/22/2018: Right mastectomy, benign breast density category C  Return to clinic in 1 year    No orders of the defined types were placed in this encounter.  The patient has a good understanding of the overall plan. she agrees with it. she will call with any problems that may develop before the next visit here.  Nicholas Lose, MD 10/20/2018  Julious Oka Dorshimer am acting as scribe for Dr. Nicholas Lose.  I have reviewed the above documentation for accuracy and completeness, and I agree with the above.

## 2018-10-20 ENCOUNTER — Inpatient Hospital Stay: Payer: Medicare Other | Attending: Hematology and Oncology | Admitting: Hematology and Oncology

## 2018-10-20 ENCOUNTER — Other Ambulatory Visit: Payer: Self-pay

## 2018-10-20 VITALS — BP 129/92 | HR 84 | Temp 98.0°F | Resp 17 | Ht 63.0 in | Wt 154.8 lb

## 2018-10-20 DIAGNOSIS — N6312 Unspecified lump in the right breast, upper inner quadrant: Secondary | ICD-10-CM

## 2018-10-20 DIAGNOSIS — C50211 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 female breast: Secondary | ICD-10-CM | POA: Diagnosis not present

## 2018-10-20 DIAGNOSIS — Z9011 Acquired absence of right breast and nipple: Secondary | ICD-10-CM | POA: Diagnosis not present

## 2018-10-20 DIAGNOSIS — M810 Age-related osteoporosis without current pathological fracture: Secondary | ICD-10-CM | POA: Diagnosis not present

## 2018-10-20 DIAGNOSIS — Z170 Estrogen receptor positive status [ER+]: Secondary | ICD-10-CM | POA: Diagnosis not present

## 2018-10-20 DIAGNOSIS — M8580 Other specified disorders of bone density and structure, unspecified site: Secondary | ICD-10-CM | POA: Diagnosis not present

## 2018-10-23 ENCOUNTER — Encounter: Payer: Self-pay | Admitting: Hematology and Oncology

## 2018-10-24 ENCOUNTER — Encounter: Payer: Self-pay | Admitting: Family Medicine

## 2018-10-24 DIAGNOSIS — Z719 Counseling, unspecified: Secondary | ICD-10-CM

## 2018-10-31 ENCOUNTER — Ambulatory Visit
Admission: RE | Admit: 2018-10-31 | Discharge: 2018-10-31 | Disposition: A | Discharge status: Home / Self Care | Payer: Medicare Other | Source: Ambulatory Visit | Attending: Hematology and Oncology | Admitting: Hematology and Oncology

## 2018-10-31 ENCOUNTER — Other Ambulatory Visit: Payer: Self-pay

## 2018-10-31 ENCOUNTER — Encounter: Payer: Self-pay | Admitting: Family Medicine

## 2018-10-31 ENCOUNTER — Other Ambulatory Visit: Payer: Self-pay | Admitting: Hematology and Oncology

## 2018-10-31 DIAGNOSIS — N6310 Unspecified lump in the right breast, unspecified quadrant: Secondary | ICD-10-CM

## 2018-10-31 DIAGNOSIS — C50211 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 female breast: Secondary | ICD-10-CM

## 2018-10-31 DIAGNOSIS — N6312 Unspecified lump in the right breast, upper inner quadrant: Secondary | ICD-10-CM | POA: Diagnosis not present

## 2018-10-31 DIAGNOSIS — Z170 Estrogen receptor positive status [ER+]: Secondary | ICD-10-CM

## 2018-11-01 ENCOUNTER — Ambulatory Visit
Admission: RE | Admit: 2018-11-01 | Discharge: 2018-11-01 | Disposition: A | Discharge status: Home / Self Care | Payer: Medicare Other | Source: Ambulatory Visit | Attending: Hematology and Oncology | Admitting: Hematology and Oncology

## 2018-11-01 ENCOUNTER — Other Ambulatory Visit: Payer: Self-pay | Admitting: Hematology and Oncology

## 2018-11-01 DIAGNOSIS — C50211 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 female breast: Secondary | ICD-10-CM | POA: Diagnosis not present

## 2018-11-01 DIAGNOSIS — N6310 Unspecified lump in the right breast, unspecified quadrant: Secondary | ICD-10-CM

## 2018-11-01 DIAGNOSIS — N6312 Unspecified lump in the right breast, upper inner quadrant: Secondary | ICD-10-CM | POA: Diagnosis not present

## 2018-11-02 ENCOUNTER — Other Ambulatory Visit: Payer: Self-pay

## 2018-11-02 ENCOUNTER — Ambulatory Visit (INDEPENDENT_AMBULATORY_CARE_PROVIDER_SITE_OTHER): Payer: Medicare Other

## 2018-11-02 DIAGNOSIS — Z23 Encounter for immunization: Secondary | ICD-10-CM

## 2018-11-02 NOTE — Progress Notes (Signed)
Per orders of Dr.Hunter, injection of PCV13 given by Sandford Craze RN. Patient tolerated injection well.

## 2018-11-03 ENCOUNTER — Ambulatory Visit: Payer: Medicare Other

## 2018-11-03 ENCOUNTER — Telehealth: Payer: Self-pay | Admitting: Hematology and Oncology

## 2018-11-03 ENCOUNTER — Encounter: Payer: Self-pay | Admitting: *Deleted

## 2018-11-03 DIAGNOSIS — C50211 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 female breast: Secondary | ICD-10-CM

## 2018-11-03 DIAGNOSIS — Z170 Estrogen receptor positive status [ER+]: Secondary | ICD-10-CM

## 2018-11-03 NOTE — Telephone Encounter (Signed)
Spoke with patient. She sees Dr. Lindi Adie next Monday and Dr. [name] Friday. Still awaiting more information. She is very optimistic and has a great attitude.

## 2018-11-03 NOTE — Telephone Encounter (Signed)
Called patient regarding scheduling an appointment, per patient's request a follow up appointment on 07/20 has been scheduled. Provider approved and this will be a virtual visit.

## 2018-11-04 ENCOUNTER — Other Ambulatory Visit: Payer: Self-pay | Admitting: General Surgery

## 2018-11-04 DIAGNOSIS — C50911 Malignant neoplasm of unspecified site of right female breast: Secondary | ICD-10-CM

## 2018-11-04 NOTE — Progress Notes (Signed)
HEMATOLOGY-ONCOLOGY DOXIMITY VISIT PROGRESS NOTE  I connected with Patricia Chandler on 11/07/2018 at 10:15 AM EDT by Doximity video conference and verified that I am speaking with the correct person using two identifiers.  I discussed the limitations, risks, security and privacy concerns of performing an evaluation and management service by Doximity and the availability of in person appointments.  I also discussed with the patient that there may be a patient responsible charge related to this service. The patient expressed understanding and agreed to proceed.  Patient's Location: Home Physician Location: Clinic  CHIEF COMPLIANT: Follow-up of recently diagnosed breast cancer  INTERVAL HISTORY: Patricia Chandler is a 63 y.o. female with above-mentioned history of ight breast cancer treated neoadjuvant chemotherapy, mastectomy with right breast reconstruction, and who is currently on letrozole therapy. US of the right breast on 10/31/18 showed a 1.8cm mass in the right breast suspicious for recurrent breast cancer. Biopsy on 11/01/18 showed grade 2 invasive ductal carcinoma, HER-2 negative (1+), ER 90%, PR negative, Ki67 40%. She presents over Doximity today to discuss the results and treatment options.   Oncology History  Breast cancer of upper-inner quadrant of right female breast ([facility])  12/05/2015 Mammogram   Right breast mass 2:00 4 cm from nipple: 1.7 cm, T1c N0 stage IA; right breast 2:00 6 cm from nipple 1.3 cm mass, 7 mm satellite nodule between the 2 masses, T1 cN0 stage IA   12/06/2015 Initial Diagnosis   Right breast biopsy 2:00 6 cm from nipple: Grade 2-3 invasive ductal cancer, ER 90%, PR 2%, Ki-67 80%, HER-2 positive ratio 2.07; right biopsy 2:00 4 cm from nipple: IDC with DCIS, ER 95%, PR 10%, Ki-67 90%, HER-2 negative ratio 1.27   12/18/2015 Breast MRI   3 enhancing masses in the upper inner quadrant right breast spanning an area 3.7 cm (1.7 cm, 1 cm, 1.4 cm) no  lymph node enlargement   12/31/2015 - 04/15/2016 Neo-Adjuvant Chemotherapy   TCH Perjeta 6 cycles followed by Herceptin, Perjeta maintenance for 1 year completed 10/20/2016   01/08/2016 - 01/12/2016 Hospital Admission   Neutropenic fever hospitalization (because patient did not receive Neulasta with cycle 1)   01/10/2016 Miscellaneous   Genetic testing was normal did not reveal any mutations   04/16/2016 Breast MRI   Near CR to therapy. Previous 2 biopsied massses are no longer seen. Third satellite lesion is smaller from 10 mm to 5.3 mm, no abnormal LN.   05/19/2016 Surgery   Right mastectomy: IDC grade 3, 1.3 cm, low-grade DCIS, LVIDS present, margins negative, 0/1 lymph node negative, ypT1CypN0 stage IA; repeat HER-2 negative ratio 1.41   05/19/2016 Surgery   Right breast reconstruction with tissue expander. Acellular dermis for breast reconstruction (Dr.Thimappa)    06/22/2016 -  Anti-estrogen oral therapy   Letrozole 2.5 mg daily    Relapse/Recurrence   Patient palpated lump in the UIQ at surgical scar of reconstructed right breast. US of the right breast showed a 1.8cm mass suspicious for recurrent breast cancer. Biopsy showed grade 2 invasive ductal carcinoma, HER-2 negative (1+), ER 90%, PR negative, Ki67 40%.     REVIEW OF SYSTEMS:   Constitutional: Denies fevers, chills or abnormal weight loss Eyes: Denies blurriness of vision Ears, nose, mouth, throat, and face: Denies mucositis or sore throat Respiratory: Denies cough, dyspnea or wheezes Cardiovascular: Denies palpitation, chest discomfort Gastrointestinal:  Denies nausea, heartburn or change in bowel habits Skin: Denies abnormal skin rashes Lymphatics: Denies new lymphadenopathy or easy bruising Neurological:Denies numbness, tingling  or new weaknesses Behavioral/Psych: Mood is stable, no new changes  Extremities: No lower extremity edema Breast: Right mastectomy with recurrence/new primary All other systems were reviewed  with the patient and are negative.  Observations/Objective:  There were no vitals filed for this visit. There is no height or weight on file to calculate BMI.  I have reviewed the data as listed CMP Latest Ref Rng & Units 06/16/2018 06/15/2018 06/14/2018  Glucose 70 - 99 mg/dL 90 99 97  BUN 8 - 23 mg/dL '10 8 10  ' Creatinine 0.44 - 1.00 mg/dL 0.67 0.74 0.76  Sodium 135 - 145 mmol/L 138 138 136  Potassium 3.5 - 5.1 mmol/L 3.9 4.6 3.0(L)  Chloride 98 - 111 mmol/L 113(H) 111 102  CO2 22 - 32 mmol/L 18(L) 22 26  Calcium 8.9 - 10.3 mg/dL 7.9(L) 7.8(L) 8.6(L)  Total Protein 6.5 - 8.1 g/dL - 6.1(L) 7.6  Total Bilirubin 0.3 - 1.2 mg/dL - 0.8 0.8  Alkaline Phos 38 - 126 U/L - 53 64  AST 15 - 41 U/L - 16 20  ALT 0 - 44 U/L - 15 15    Lab Results  Component Value Date   WBC 6.5 06/16/2018   HGB 12.0 06/16/2018   HCT 37.8 06/16/2018   MCV 92.6 06/16/2018   PLT 245 06/16/2018   NEUTROABS 6.5 06/14/2018      Assessment Plan:  Breast cancer of upper-inner quadrant of right female breast ([place]) Right breast biopsy 2:00 6 cm from nipple: Grade 2-3 invasive ductal cancer, ER 90%, PR 2%, Ki-67 80%, HER-2 positive ratio 2.07; right biopsy 2:00 4 cm from nipple: IDC with DCIS, ER 95%, PR 10%, Ki-67 90%, HER-2 negative ratio 1.27 Breast MRI08/30/2017: 3 enhancing masses in the upper inner quadrant right breast spanning an area 3.7 cm (1.7 cm, 1 cm, 1.4 cm) no lymph node enlargement Clinical stage: T2N0 (stage 2A)  Treatment plan: 1. Genetic counseling: Normal did not reveal any abnormal mutations 2. Neoadjuvant chemotherapy. [facility] Perjeta 6 cycles followed by Herceptin And Perjetamaintenance completed 10/20/2016) from 12/31/15 to 04/15/16 3. Right mastectomy with reconstruction: 05/19/2016:Right mastectomy: IDC grade 3, 1.3 cm, low-grade DCIS, LVIDS present, margins negative, 0/1 lymph node negative, ypT1CypN0 stage IA; repeat HER-2 negative ratio 1.41 4. Adjuvant antiestrogen therapy With letrozole  2.5 mg daily to be started March 2018 -------------------------------------------------------------------------------------------------------------------------------------------------- Breast cancer: Subcutaneous disease: 1.8 cm measured by ultrasound Biopsy revealed grade 2-3 IDC ER 90% PR 0% HER-2 negative Ki-67 40% Pathology review: I discussed with her the details of the pathology report.  Treatment plan: 1.  Since it is HER-2 negative I would approach this is a second primary and we will send tissue for Oncotype DX testing. 2.  Resection of the tumor 3.  We discussed the pros and cons of doing axillary lymph node surgery.  Ultrasound of the axilla was negative.  Dr. [name] may decide not to do additional lymph node surgery. 4.  Systemic chemotherapy based upon Oncotype DX testing 5. Adjuvant radiation  Chemo counseling: We briefly discussed the role of Adriamycin and Cytoxan followed by Taxol as a treatment option in case the Oncotype was high risk.  CT chest abdomen pelvis has been planned for 11/16/2018. I will do a MyChart video visit on 11/17/2018 to discuss the results.     I discussed the assessment and treatment plan with the patient. The patient was provided an opportunity to ask questions and all were answered. The patient agreed with the plan and demonstrated an understanding  of the instructions. The patient was advised to call back or seek an in-person evaluation if the symptoms worsen or if the condition fails to improve as anticipated.   I provided 20 minutes of face-to-face Doximity time during this encounter.    Rulon Eisenmenger, MD 11/07/2018   I, Molly Dorshimer, am acting as scribe for Nicholas Lose, MD.  I have reviewed the above documentation for accuracy and completeness, and I agree with the above.

## 2018-11-04 NOTE — Telephone Encounter (Signed)
Confirmed Appt/ verified info

## 2018-11-07 ENCOUNTER — Telehealth: Payer: Self-pay | Admitting: *Deleted

## 2018-11-07 ENCOUNTER — Inpatient Hospital Stay (HOSPITAL_BASED_OUTPATIENT_CLINIC_OR_DEPARTMENT_OTHER): Payer: Medicare Other | Admitting: Hematology and Oncology

## 2018-11-07 ENCOUNTER — Telehealth: Payer: Self-pay | Admitting: Hematology and Oncology

## 2018-11-07 DIAGNOSIS — C50211 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 female breast: Secondary | ICD-10-CM

## 2018-11-07 DIAGNOSIS — Z170 Estrogen receptor positive status [ER+]: Secondary | ICD-10-CM | POA: Diagnosis not present

## 2018-11-07 DIAGNOSIS — Z9221 Personal history of antineoplastic chemotherapy: Secondary | ICD-10-CM | POA: Diagnosis not present

## 2018-11-07 DIAGNOSIS — Z9011 Acquired absence of right breast and nipple: Secondary | ICD-10-CM | POA: Diagnosis not present

## 2018-11-07 DIAGNOSIS — Z79811 Long term (current) use of aromatase inhibitors: Secondary | ICD-10-CM | POA: Diagnosis not present

## 2018-11-07 NOTE — Assessment & Plan Note (Signed)
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 Genetic counseling: Normal did not reveal any abnormal mutations 2. Neoadjuvant chemotherapy. [facility] Perjeta 6 cycles followed by Herceptin And Perjetamaintenance completed 10/20/2016) from 12/31/15 to 04/15/16 3. Right mastectomy with reconstruction: 05/19/2016:Right mastectomy: IDC grade 3, 1.3 cm, low-grade DCIS, LVIDS present, margins negative, 0/1 lymph node negative, ypT1CypN0 stage IA; repeat HER-2 negative ratio 1.41 4. Adjuvant antiestrogen therapy With letrozole 2.5 mg daily to be started March 2018 -------------------------------------------------------------------------------------------------------------------------------------------------- Breast cancer: Subcutaneous disease: 1.8 cm measured by ultrasound Biopsy revealed grade 2-3 IDC ER 90% PR 0% HER-2 negative Ki-67 40% Pathology review: I discussed with her the details of the pathology report.  Treatment plan: 1.  Since it is HER-2 negative I would approach this is a second primary and we will send tissue for Oncotype DX testing. 2.  Resection of the tumor 3.  We discussed the pros and cons of doing axillary lymph node surgery.  Ultrasound of the axilla was negative.  Dr. [name] may decide not to do additional lymph node surgery. 4.  Systemic chemotherapy based upon Oncotype DX testing 5. Adjuvant radiation  Chemo counseling: We briefly discussed the role of Adriamycin and Cytoxan followed by Taxol as a treatment option in case the Oncotype was high risk.  CT chest abdomen pelvis has been  planned for 11/16/2018. I will do a MyChart video visit on 11/17/2018 to discuss the results.

## 2018-11-07 NOTE — Telephone Encounter (Signed)
Received order for oncotype testing on core bx. Requisition sent to [facility]

## 2018-11-07 NOTE — Telephone Encounter (Signed)
I talk with patient regarding video visit °

## 2018-11-08 ENCOUNTER — Telehealth: Payer: Self-pay | Admitting: *Deleted

## 2018-11-08 ENCOUNTER — Other Ambulatory Visit: Payer: Self-pay | Admitting: *Deleted

## 2018-11-08 DIAGNOSIS — Z170 Estrogen receptor positive status [ER+]: Secondary | ICD-10-CM

## 2018-11-08 DIAGNOSIS — C50211 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 female breast: Secondary | ICD-10-CM

## 2018-11-08 NOTE — Telephone Encounter (Signed)
Called pt with new breast MRI and CT/bone scan appts. Confirmed new mri for 7/22 at 5:45 and CT/bone scan on 7/24. Discussed contrast prep and timing as well as NPO status prior to exam. Denies further questions or needs. New appt with Dr. Lindi Adie to discuss scans scheduled and confirmed as well.

## 2018-11-09 ENCOUNTER — Ambulatory Visit
Admission: RE | Admit: 2018-11-09 | Discharge: 2018-11-09 | Disposition: A | Discharge status: Home / Self Care | Payer: Medicare Other | Source: Ambulatory Visit | Attending: General Surgery | Admitting: General Surgery

## 2018-11-09 ENCOUNTER — Encounter: Payer: Self-pay | Admitting: *Deleted

## 2018-11-09 DIAGNOSIS — C50911 Malignant neoplasm of unspecified site of right female breast: Secondary | ICD-10-CM | POA: Diagnosis not present

## 2018-11-09 MED ORDER — GADOBUTROL 1 MMOL/ML IV SOLN
7.0000 mL | Freq: Once | INTRAVENOUS | Status: AC | PRN
  Administered 2018-11-09: 7 mL via INTRAVENOUS

## 2018-11-10 ENCOUNTER — Inpatient Hospital Stay: Payer: Medicare Other

## 2018-11-11 ENCOUNTER — Encounter (HOSPITAL_COMMUNITY): Payer: Self-pay

## 2018-11-11 ENCOUNTER — Encounter (HOSPITAL_COMMUNITY)
Admission: RE | Admit: 2018-11-11 | Discharge: 2018-11-11 | Disposition: A | Discharge status: Home / Self Care | Payer: Medicare Other | Source: Ambulatory Visit | Attending: Hematology and Oncology | Admitting: Hematology and Oncology

## 2018-11-11 ENCOUNTER — Other Ambulatory Visit: Payer: Self-pay

## 2018-11-11 ENCOUNTER — Ambulatory Visit (HOSPITAL_COMMUNITY)
Admission: RE | Admit: 2018-11-11 | Discharge: 2018-11-11 | Disposition: A | Discharge status: Home / Self Care | Payer: Medicare Other | Source: Ambulatory Visit | Attending: Hematology and Oncology | Admitting: Hematology and Oncology

## 2018-11-11 DIAGNOSIS — R918 Other nonspecific abnormal finding of lung field: Secondary | ICD-10-CM | POA: Diagnosis not present

## 2018-11-11 DIAGNOSIS — Z170 Estrogen receptor positive status [ER+]: Secondary | ICD-10-CM | POA: Insufficient documentation

## 2018-11-11 DIAGNOSIS — C50911 Malignant neoplasm of unspecified site of right female breast: Secondary | ICD-10-CM | POA: Diagnosis not present

## 2018-11-11 DIAGNOSIS — C50211 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 female breast: Secondary | ICD-10-CM | POA: Diagnosis not present

## 2018-11-11 DIAGNOSIS — Z9011 Acquired absence of right breast and nipple: Secondary | ICD-10-CM | POA: Diagnosis not present

## 2018-11-11 DIAGNOSIS — C50919 Malignant neoplasm of unspecified site of unspecified female breast: Secondary | ICD-10-CM | POA: Diagnosis not present

## 2018-11-11 DIAGNOSIS — K7689 Other specified diseases of liver: Secondary | ICD-10-CM | POA: Diagnosis not present

## 2018-11-11 HISTORY — DX: Malignant neoplasm of unspecified site of right female breast: C50.911

## 2018-11-11 MED ORDER — TECHNETIUM TC 99M MEDRONATE IV KIT
20.0000 | PACK | Freq: Once | INTRAVENOUS | Status: AC | PRN
  Administered 2018-11-11: 20.8 via INTRAVENOUS

## 2018-11-11 MED ORDER — IOHEXOL 300 MG/ML  SOLN
100.0000 mL | Freq: Once | INTRAMUSCULAR | Status: AC | PRN
  Administered 2018-11-11: 13:00:00 100 mL via INTRAVENOUS

## 2018-11-11 MED ORDER — SODIUM CHLORIDE (PF) 0.9 % IJ SOLN
INTRAMUSCULAR | Status: AC
  Filled 2018-11-11: qty 50

## 2018-11-14 ENCOUNTER — Telehealth: Payer: Medicare Other | Admitting: Hematology and Oncology

## 2018-11-14 ENCOUNTER — Telehealth: Payer: Self-pay | Admitting: *Deleted

## 2018-11-14 ENCOUNTER — Other Ambulatory Visit: Payer: Self-pay | Admitting: General Surgery

## 2018-11-14 ENCOUNTER — Encounter: Payer: Self-pay | Admitting: Hematology and Oncology

## 2018-11-14 DIAGNOSIS — Z170 Estrogen receptor positive status [ER+]: Secondary | ICD-10-CM | POA: Diagnosis not present

## 2018-11-14 DIAGNOSIS — C7989 Secondary malignant neoplasm of other specified sites: Secondary | ICD-10-CM

## 2018-11-14 DIAGNOSIS — C50911 Malignant neoplasm of unspecified site of right female breast: Secondary | ICD-10-CM

## 2018-11-14 DIAGNOSIS — C50211 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 female breast: Secondary | ICD-10-CM | POA: Diagnosis not present

## 2018-11-14 NOTE — Telephone Encounter (Signed)
Called pt with MRI and CT/bone scan results. Discussed no distant dz noted. Informed oncotype score is expected to be released around 8/4. Physician team notified.

## 2018-11-15 ENCOUNTER — Telehealth: Payer: Self-pay | Admitting: *Deleted

## 2018-11-15 NOTE — Telephone Encounter (Signed)
Received oncotype score of 52. Physician team notified. Called pt with results and discussed chemotherapy (AC/T) is recommended by Dr. Lindi Adie.  Confirmed appt on 7/31 to discuss results and plan for tx. Denies questions or needs at this time.

## 2018-11-16 ENCOUNTER — Encounter (HOSPITAL_COMMUNITY): Payer: Medicare Other

## 2018-11-16 ENCOUNTER — Other Ambulatory Visit (HOSPITAL_COMMUNITY): Payer: Medicare Other

## 2018-11-17 ENCOUNTER — Encounter: Payer: Self-pay | Admitting: Hematology and Oncology

## 2018-11-17 ENCOUNTER — Telehealth: Payer: Self-pay | Admitting: *Deleted

## 2018-11-17 ENCOUNTER — Telehealth: Payer: Medicare Other | Admitting: Hematology and Oncology

## 2018-11-17 ENCOUNTER — Telehealth: Payer: Self-pay | Admitting: Hematology and Oncology

## 2018-11-17 NOTE — Progress Notes (Signed)
HEMATOLOGY-ONCOLOGY MYCHART VIDEO VISIT PROGRESS NOTE  I connected with Patricia Chandler on 11/18/2018 at 12:00 PM EDT by MyChart video conference and verified that I am speaking with the correct person using two identifiers.  I discussed the limitations, risks, security and privacy concerns of performing an evaluation and management service by MyChart and the availability of in person appointments.  I also discussed with the patient that there may be a patient responsible charge related to this service. The patient expressed understanding and agreed to proceed.  Patient's Location: Home Physician Location: Clinic  CHIEF COMPLIANT: Follow-up to review recent scans   INTERVAL HISTORY: Patricia Chandler is a 63 y.o. female with above-mentioned history of recurrent right breast cancer. Breast MRI on 11/09/18 showed the biopsy proven mass measuring 1.8cm, with suspicion of focal invasion of the overlying skin. Invasion of the underlying pectoral muscle could not be excluded. There was no lymphadenopathy. CT CAP on 11/11/18 showed no evidence of metastatic disease and stable pulmonary nodules. Bone scan on 11/11/18 showed no evidence of osseous metastases. Oncotype testing revealed a score of 52. She presents over Doximity today for follow-up.   Oncology History  Breast cancer of upper-inner quadrant of right female breast ([place])  12/05/2015 Mammogram   Right breast mass 2:00 4 cm from nipple: 1.7 cm, T1c N0 stage IA; right breast 2:00 6 cm from nipple 1.3 cm mass, 7 mm satellite nodule between the 2 masses, T1 cN0 stage IA   12/06/2015 Initial Diagnosis   Right breast biopsy 2:00 6 cm from nipple: Grade 2-3 invasive ductal cancer, ER 90%, PR 2%, Ki-67 80%, HER-2 positive ratio 2.07; right biopsy 2:00 4 cm from nipple: IDC with DCIS, ER 95%, PR 10%, Ki-67 90%, HER-2 negative ratio 1.27   12/18/2015 Breast MRI   3 enhancing masses in the upper inner quadrant right breast spanning an area  3.7 cm (1.7 cm, 1 cm, 1.4 cm) no lymph node enlargement   12/31/2015 - 04/15/2016 Neo-Adjuvant Chemotherapy   TCH Perjeta 6 cycles followed by Herceptin, Perjeta maintenance for 1 year completed 10/20/2016   01/08/2016 - 01/12/2016 Hospital Admission   Neutropenic fever hospitalization (because patient did not receive Neulasta with cycle 1)   01/10/2016 Miscellaneous   Genetic testing was normal did not reveal any mutations   04/16/2016 Breast MRI   Near CR to therapy. Previous 2 biopsied massses are no longer seen. Third satellite lesion is smaller from 10 mm to 5.3 mm, no abnormal LN.   05/19/2016 Surgery   Right mastectomy: IDC grade 3, 1.3 cm, low-grade DCIS, LVIDS present, margins negative, 0/1 lymph node negative, ypT1CypN0 stage IA; repeat HER-2 negative ratio 1.41   05/19/2016 Surgery   Right breast reconstruction with tissue expander. Acellular dermis for breast reconstruction (Dr.Thimappa)    06/22/2016 -  Anti-estrogen oral therapy   Letrozole 2.5 mg daily   11/01/2018 Relapse/Recurrence   Patient palpated lump in the UIQ at surgical scar of reconstructed right breast. US of the right breast showed a 1.8cm mass suspicious for recurrent breast cancer. Biopsy showed grade 2 invasive ductal carcinoma, HER-2 negative (1+), ER 90%, PR negative, Ki67 40%.   11/11/2018 Oncotype testing   Oncotype score 52: Greater than 39% risk of distant recurrence with hormone therapy alone, chemo benefit greater than 15%     REVIEW OF SYSTEMS:   Constitutional: Denies fevers, chills or abnormal weight loss Eyes: Denies blurriness of vision Ears, nose, mouth, throat, and face: Denies mucositis or sore throat Respiratory:  Denies cough, dyspnea or wheezes Cardiovascular: Denies palpitation, chest discomfort Gastrointestinal:  Denies nausea, heartburn or change in bowel habits Skin: Denies abnormal skin rashes Lymphatics: Denies new lymphadenopathy or easy bruising Neurological:Denies numbness,  tingling or new weaknesses Behavioral/Psych: Mood is stable, no new changes  Extremities: No lower extremity edema Breast: denies any pain or lumps or nodules in either breasts All other systems were reviewed with the patient and are negative.  Observations/Objective:  There were no vitals filed for this visit. There is no height or weight on file to calculate BMI.  I have reviewed the data as listed CMP Latest Ref Rng & Units 11/18/2018 06/16/2018 06/15/2018  Glucose 70 - 99 mg/dL 91 90 99  BUN 8 - 23 mg/dL 19 10 8  Creatinine 0.44 - 1.00 mg/dL 0.92 0.67 0.74  Sodium 135 - 145 mmol/L 138 138 138  Potassium 3.5 - 5.1 mmol/L 3.9 3.9 4.6  Chloride 98 - 111 mmol/L 106 113(H) 111  CO2 22 - 32 mmol/L 21(L) 18(L) 22  Calcium 8.9 - 10.3 mg/dL 9.4 7.9(L) 7.8(L)  Total Protein 6.5 - 8.1 g/dL - - 6.1(L)  Total Bilirubin 0.3 - 1.2 mg/dL - - 0.8  Alkaline Phos 38 - 126 U/L - - 53  AST 15 - 41 U/L - - 16  ALT 0 - 44 U/L - - 15    Lab Results  Component Value Date   WBC 9.6 11/18/2018   HGB 14.6 11/18/2018   HCT 44.6 11/18/2018   MCV 91.4 11/18/2018   PLT 261 11/18/2018   NEUTROABS 6.5 06/14/2018      Assessment Plan:  Breast cancer of upper-inner quadrant of right female breast ([facility]) Right breast biopsy 2:00 6 cm from nipple: Grade 2-3 invasive ductal cancer, ER 90%, PR 2%, Ki-67 80%, HER-2 positive ratio 2.07; right biopsy 2:00 4 cm from nipple: IDC with DCIS, ER 95%, PR 10%, Ki-67 90%, HER-2 negative ratio 1.27 Breast MRI08/30/2017: 3 enhancing masses in the upper inner quadrant right breast spanning an area 3.7 cm (1.7 cm, 1 cm, 1.4 cm) no lymph node enlargement Clinical stage: T2N0 (stage 2A)  Treatment plan: 1. Genetic counseling: Normal did not reveal any abnormal mutations 2. Neoadjuvant chemotherapy. ([facility] Perjeta 6 cycles followed by Herceptin And Perjetamaintenance completed 10/20/2016) from 12/31/15 to 04/15/16 3. Right mastectomy with reconstruction: 05/19/2016:Right  mastectomy: IDC grade 3, 1.3 cm, low-grade DCIS, LVIDS present, margins negative, 0/1 lymph node negative, ypT1CypN0 stage IA; repeat HER-2 negative ratio 1.41 4. Adjuvant antiestrogen therapy With letrozole 2.5 mg daily to be started March 2018 -------------------------------------------------------------------------------------------------------------------------------------------------- Breast cancer: Subcutaneous disease: 1.8 cm measured by ultrasound Biopsy revealed grade 2-3 IDC ER 90% PR 0% HER-2 negative Ki-67 40%  Oncotype score 52: Greater than 39% risk of distant recurrence with hormone therapy alone, chemo benefit greater than 15%  Recommendation: 1.  Surgery to remove the tumor 2.  Adjuvant chemotherapy with dose dense Adriamycin and Cytoxan x4 followed by Taxol weekly x12 3.  Adjuvant radiation 4.  Follow-up adjuvant antiestrogen therapy  Chemotherapy Counseling: I discussed the risks and benefits of chemotherapy including the risks of nausea/ vomiting, risk of infection from low WBC count, fatigue due to chemo or anemia, bruising or bleeding due to low platelets, mouth sores, loss/ change in taste and decreased appetite. Liver and kidney function will be monitored through out chemotherapy as abnormalities in liver and kidney function may be a side effect of treatment.  Also discussed neuropathy risk with Taxol.  Cardiac dysfunction due   to Adriamycin   were discussed in detail. Risk of permanent bone marrow dysfunction and leukemia due to chemo were also discussed.  I sent a message to Dr. Haroldine Laws to monitor her cardiac function through Adriamycin treatment. Return to clinic after surgery to discuss starting adjuvant chemo.      I discussed the assessment and treatment plan with the patient. The patient was provided an opportunity to ask questions and all were answered. The patient agreed with the plan and demonstrated an understanding of the instructions. The patient was  advised to call back or seek an in-person evaluation if the symptoms worsen or if the condition fails to improve as anticipated.   I provided 40 minutes of face-to-face MyChart video visit time during this encounter.    Rulon Eisenmenger, MD 11/18/2018   I, Molly Dorshimer, am acting as scribe for Nicholas Lose, MD.  I have reviewed the above documentation for accuracy and completeness, and I agree with the above.

## 2018-11-17 NOTE — Pre-Procedure Instructions (Signed)
[facility] - [place] - [address] [address] [place] [address] Phone: [phone] Fax: [phone]  [place], [place] - [place] [place] [address] Phone: [phone] Fax: [phone]      Your procedure is scheduled on  11-30-18  Report to [facility] Main Entrance "A" at [place].M., and check in at the Admitting office.  Call this number if you have problems the morning of surgery:  [phone]  Call [phone] if you have any questions prior to your surgery date Monday-Friday 8am-4pm    Remember:  Do not eat or drink after midnight the night before your surgery  You may drink clear liquids until 0530 AM the morning of your surgery.   Clear liquids allowed are: Water, Non-Citrus Juices (without pulp), Carbonated Beverages, Clear Tea, Black Coffee Only, and Gatorade   Please complete your PRE-SURGERY ENSURE that was provided to you by ... the morning of surgery.  Please, if able, drink it in one setting. DO NOT SIP.  Take these medicines the morning of surgery with A SIP OF WATER : omeprazole (PRILOSEC) as needed  7 days prior to surgery STOP taking any Aspirin (unless otherwise instructed by your surgeon), Aleve, Naproxen, Ibuprofen, Motrin, Advil, Goody's, BC's, all herbal medications, fish oil, and all vitamins.    The Morning of Surgery  Do not wear jewelry, make-up or nail polish.  Do not wear lotions, powders, or perfumes/colognes, or deodorant  Do not shave 48 hours prior to surgery.    Do not bring valuables to the hospital.  [facility] is not responsible for any belongings or valuables.  If you are a smoker, DO NOT Smoke 24 hours prior to surgery IF you wear a CPAP at night please bring your mask, tubing, and machine the morning of surgery   Remember that you must have someone to transport you home after your surgery, and  remain with you for 24 hours if you are discharged the same day.   Contacts, glasses, hearing aids, dentures or bridgework may not be worn into surgery.    Leave your suitcase in the car.  After surgery it may be brought to your room.  For patients admitted to the hospital, discharge time will be determined by your treatment team.  Patients discharged the day of surgery will not be allowed to drive home.    Special instructions:   [facility]- Preparing For Surgery  Before surgery, you can play an important role. Because skin is not sterile, your skin needs to be as free of germs as possible. You can reduce the number of germs on your skin by washing with CHG (chlorahexidine gluconate) Soap before surgery.  CHG is an antiseptic cleaner which kills germs and bonds with the skin to continue killing germs even after washing.    Oral Hygiene is also important to reduce your risk of infection.  Remember - BRUSH YOUR TEETH THE MORNING OF SURGERY WITH YOUR REGULAR TOOTHPASTE  Please do not use if you have an allergy to CHG or antibacterial soaps. If your skin becomes reddened/irritated stop using the CHG.  Do not shave (including legs and underarms) for at least 48 hours prior to first CHG shower. It is OK to shave your face.  Please follow these instructions carefully.   1. Shower the NIGHT BEFORE SURGERY and the MORNING OF SURGERY with CHG Soap.   2.  If you chose to wash your hair, wash your hair first as usual with your normal shampoo.  3. After you shampoo, rinse your hair and body thoroughly to remove the shampoo.  4. Use CHG as you would any other liquid soap. You can apply CHG directly to the skin and wash gently with a scrungie or a clean washcloth.   5. Apply the CHG Soap to your body ONLY FROM THE NECK DOWN.  Do not use on open wounds or open sores. Avoid contact with your eyes, ears, mouth and genitals (private parts). Wash Face and genitals (private parts)  with your normal  soap.   6. Wash thoroughly, paying special attention to the area where your surgery will be performed.  7. Thoroughly rinse your body with warm water from the neck down.  8. DO NOT shower/wash with your normal soap after using and rinsing off the CHG Soap.  9. Pat yourself dry with a CLEAN TOWEL.  10. Wear CLEAN PAJAMAS to bed the night before surgery, wear comfortable clothes the morning of surgery  11. Place CLEAN SHEETS on your bed the night of your first shower and DO NOT SLEEP WITH PETS.  Day of Surgery:  Do not apply any deodorants/lotions. Please shower the morning of surgery with the CHG soap  Please wear clean clothes to the hospital/surgery center.   Remember to brush your teeth WITH YOUR REGULAR TOOTHPASTE.  Please read over the  fact sheets that you were given.

## 2018-11-17 NOTE — Telephone Encounter (Signed)
Referral/records faxed to [facility]- Dr. Janan Halter - Release [id number]

## 2018-11-17 NOTE — Telephone Encounter (Signed)
Confirmed appt/verified info °

## 2018-11-18 ENCOUNTER — Other Ambulatory Visit: Payer: Self-pay

## 2018-11-18 ENCOUNTER — Encounter (HOSPITAL_COMMUNITY): Payer: Self-pay

## 2018-11-18 ENCOUNTER — Inpatient Hospital Stay (HOSPITAL_BASED_OUTPATIENT_CLINIC_OR_DEPARTMENT_OTHER): Payer: Medicare Other | Admitting: Hematology and Oncology

## 2018-11-18 ENCOUNTER — Encounter (HOSPITAL_COMMUNITY)
Admission: RE | Admit: 2018-11-18 | Discharge: 2018-11-18 | Disposition: A | Discharge status: Home / Self Care | Payer: Medicare Other | Source: Ambulatory Visit | Attending: General Surgery | Admitting: General Surgery

## 2018-11-18 DIAGNOSIS — Z9221 Personal history of antineoplastic chemotherapy: Secondary | ICD-10-CM

## 2018-11-18 DIAGNOSIS — K219 Gastro-esophageal reflux disease without esophagitis: Secondary | ICD-10-CM | POA: Diagnosis not present

## 2018-11-18 DIAGNOSIS — Z170 Estrogen receptor positive status [ER+]: Secondary | ICD-10-CM | POA: Diagnosis not present

## 2018-11-18 DIAGNOSIS — Z01818 Encounter for other preprocedural examination: Secondary | ICD-10-CM | POA: Insufficient documentation

## 2018-11-18 DIAGNOSIS — Z79899 Other long term (current) drug therapy: Secondary | ICD-10-CM | POA: Insufficient documentation

## 2018-11-18 DIAGNOSIS — C50211 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 female breast: Secondary | ICD-10-CM | POA: Diagnosis not present

## 2018-11-18 DIAGNOSIS — Z9011 Acquired absence of right breast and nipple: Secondary | ICD-10-CM | POA: Diagnosis not present

## 2018-11-18 DIAGNOSIS — R011 Cardiac murmur, unspecified: Secondary | ICD-10-CM | POA: Diagnosis not present

## 2018-11-18 DIAGNOSIS — Z79811 Long term (current) use of aromatase inhibitors: Secondary | ICD-10-CM | POA: Diagnosis not present

## 2018-11-18 LAB — BASIC METABOLIC PANEL
Anion gap: 11 (ref 5–15)
BUN: 19 mg/dL (ref 8–23)
CO2: 21 mmol/L — ABNORMAL LOW (ref 22–32)
Calcium: 9.4 mg/dL (ref 8.9–10.3)
Chloride: 106 mmol/L (ref 98–111)
Creatinine, Ser: 0.92 mg/dL (ref 0.44–1.00)
GFR calc Af Amer: >60 mL/min (ref >60)
GFR calc non Af Amer: >60 mL/min (ref >60)
Glucose, Bld: 91 mg/dL (ref 70–99)
Potassium: 3.9 mmol/L (ref 3.5–5.1)
Sodium: 138 mmol/L (ref 135–145)

## 2018-11-18 LAB — CBC
HCT: 44.6 % (ref 36.0–46.0)
Hemoglobin: 14.6 g/dL (ref 12.0–15.0)
MCH: 29.9 pg (ref 26.0–34.0)
MCHC: 32.7 g/dL (ref 30.0–36.0)
MCV: 91.4 fL (ref 80.0–100.0)
Platelets: 261 10*3/uL (ref 150–400)
RBC: 4.88 MIL/uL (ref 3.87–5.11)
RDW: 13.1 % (ref 11.5–15.5)
WBC: 9.6 10*3/uL (ref 4.0–10.5)
nRBC: 0 % (ref 0.0–0.2)

## 2018-11-18 NOTE — Assessment & Plan Note (Signed)
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 Genetic counseling: Normal did not reveal any abnormal mutations 2. Neoadjuvant chemotherapy. [facility] Perjeta 6 cycles followed by Herceptin And Perjetamaintenance completed 10/20/2016) from 12/31/15 to 04/15/16 3. Right mastectomy with reconstruction: 05/19/2016:Right mastectomy: IDC grade 3, 1.3 cm, low-grade DCIS, LVIDS present, margins negative, 0/1 lymph node negative, ypT1CypN0 stage IA; repeat HER-2 negative ratio 1.41 4. Adjuvant antiestrogen therapy With letrozole 2.5 mg daily to be started March 2018 -------------------------------------------------------------------------------------------------------------------------------------------------- Breast cancer: Subcutaneous disease: 1.8 cm measured by ultrasound Biopsy revealed grade 2-3 IDC ER 90% PR 0% HER-2 negative Ki-67 40%  Oncotype score 52: Greater than 39% risk of distant recurrence with hormone therapy alone, chemo benefit greater than 15%  Recommendation: 1.  Surgery to remove the tumor 2.  Adjuvant chemotherapy with dose dense Adriamycin and Cytoxan x4 followed by Taxol weekly x12 3.  Adjuvant radiation 4.  Follow-up adjuvant antiestrogen therapy  Chemotherapy Counseling: I discussed the risks and benefits of chemotherapy including the risks of nausea/ vomiting, risk of infection from low WBC count, fatigue due to chemo or anemia, bruising or bleeding due to low platelets, mouth sores, loss/ change in taste and decreased appetite. Liver and kidney function will be monitored through out  chemotherapy as abnormalities in liver and kidney function may be a side effect of treatment.  Also discussed neuropathy risk with Taxol.  Cardiac dysfunction due to Adriamycin   were discussed in detail. Risk of permanent bone marrow dysfunction and leukemia due to chemo were also discussed.  I sent a message to Dr. Haroldine Laws to monitor her cardiac function through Adriamycin treatment. Return to clinic after surgery to discuss starting adjuvant chemo.

## 2018-11-18 NOTE — Progress Notes (Signed)
  Coronavirus Screening Scheduled for COVID test on 11-26-18 Have you experienced the following symptoms:  Cough yes/no: No Fever (>100.20F)  yes/no: No Runny nose yes/no: No Sore throat yes/no: No Difficulty breathing/shortness of breath  yes/no: No Have you or a family member traveled in the last 14 days and where? yes/no: No  PCP - Dr Garret Reddish Oncology-Dr Nicholas Lose Cardiologist - Dr Haroldine Laws  Chest x-ray - NA  EKG -today (Sinus rhythm with PAC's)  Stress Test - denies  ECHO -10-29-16   Cardiac Cath - denies  AICD-denies PM-denies LOOP-denies  Sleep Study - denies CPAP - NA  LABS-CBC,BMP  ASA-denies  ERAS-Pre-surgery Ensure given with instructions  Not a known diabetic. Fasting Blood Sugar -  Checks Blood Sugar ___0__ times a day  Anesthesia-Y. H/o reduced EF while on chemo in 2018.Per Dr Haroldine Laws EF improved.  Pt denies having chest pain, sob, or fever at this time. All instructions explained to the pt, with a verbal understanding of the material. Pt agrees to go over the instructions while at home for a better understanding. Pt also instructed to self quarantine after being tested for COVID-19. The opportunity to ask questions was provided.

## 2018-11-21 ENCOUNTER — Other Ambulatory Visit: Payer: Self-pay | Admitting: *Deleted

## 2018-11-21 ENCOUNTER — Telehealth: Payer: Self-pay | Admitting: Hematology and Oncology

## 2018-11-21 DIAGNOSIS — C50911 Malignant neoplasm of unspecified site of right female breast: Secondary | ICD-10-CM | POA: Diagnosis not present

## 2018-11-21 NOTE — Progress Notes (Signed)
Anesthesia Chart Review:  Case: [id number] Date/Time: 11/30/18 0815   Procedures:      RIGHT BREAST CHEST WALL RECURRENCE EXCISION (Right )     RIGHT AXILLARY SENTINEL NODE BIOPSY (Right )     INSERTION PORT-A-CATH WITH ULTRASOUND (N/A )   Anesthesia type: General   Pre-op diagnosis: right breast cancer   Location: [facility] ROOM 01 / [facility]   Surgeon: Rolm Bookbinder, MD      DISCUSSION: Patient is a 63 year old female scheduled for the above procedure.  History includes never smoker, right breast cancer (initial diagnosis 12/06/15, s/p chemotherapy via right IF PowerPort 12/31/15-04/15/16, s/p right mastectomy with reconstruction 05/19/16; Letrozole started 06/22/16; recurrent tumor 11/01/18 with anticipated plans for post-surgical adjuvant chemoradiation), diverticulitis 05/2018 (s/p colonoscopy 09/09/18), GERD, anxiety, murmur, cardiomyopathy (Herceptin induced, EF 50-55% 06/2016, Herceptin held and started on ARB, EF 60-65% 08/2016). Recent chest CT on 11/11/18 showed a 4 cm ascending TAA.  Presurgical COVID-19 test is scheduled for 11/26/2018.  If negative and otherwise otherwise no acute changes and I would anticipate that she can proceed as planned.   VS: BP 128/87   Pulse 91   Temp (!) 36.3 C (Oral)   Resp 18   Ht 5\' 3"  (1.6 m)   Wt 69.4 kg   SpO2 98%   BMI 27.11 kg/m    PROVIDERS: [name], MD is PCP Nicholas Lose, MD is HEM-ONC. Last visit 11/18/18. Bensimhon, Daniel, MD is cardiologist (seen at [facility] while on chemotherapy). PRN follow-up after her 10/29/16 visit, although oncology notes suggest that she will get re-established once back on chemotherapy.   LABS: Labs reviewed: Acceptable for surgery. (all labs ordered are listed, but only abnormal results are displayed)  Labs Reviewed  BASIC METABOLIC PANEL - Abnormal; Notable for the following components:      Result Value   CO2 21 (*)    All other components within normal limits  CBC     IMAGES: CT  chest/abd/pelvis 11/11/18 (ordered by Dr. Lindi Adie): IMPRESSION: 1. No evidence for metastatic disease in the chest, abdomen, or pelvis. 2. Tiny bilateral pulmonary nodules. 5 mm nodule in the left lower lobe is stable since prior study. These are likely benign but close attention on follow-up recommended. 3. Stable left hepatic cyst. 4. Tiny hiatal hernia. 5. Ascending thoracic aorta measures 4 cm diameter. Recommend annual imaging followup by CTA or MRA. This recommendation follows 2010 ACCF/AHA/AATS/ACR/ASA/SCA/SCAI/SIR/STS/SVM Guidelines for the Diagnosis and Management of Patients with Thoracic Aortic Disease. Circulation. 2010; 121: E081-K481. Aortic aneurysm NOS (ICD10-I71.9)  Bone scan 11/11/18: IMPRESSION: No scintigraphic evidence of osseous metastatic disease.   EKG: 11/18/18: Sinus rhythm with Premature atrial complexes Otherwise normal ECG Confirmed by Ida Rogue [phone]) on 11/18/2018 12:19:25 PM   CV: Echo 10/29/16: Study Conclusions - Left ventricle: The cavity size was normal. Wall thickness was   normal. Systolic function was normal. The estimated ejection   fraction was in the range of 55% to 60%. Wall motion was normal;   there were no regional wall motion abnormalities. Doppler   parameters are consistent with abnormal left ventricular   relaxation (grade 1 diastolic dysfunction). - Impressions: GLS -20% LS 10.6 cm/s.   Past Medical History:  Diagnosis Date  . Anemia    due to chemo  . Anxiety   . Breast cancer ([place]) dx'd 2017   right  . Cardiomyopathy ([place])    mild, per cardiology note; due to Herceptin  . Dental crowns present   .  Family history of adverse reaction to anesthesia    pt's daughter has hx. of post-op nausea  . GERD (gastroesophageal reflux disease)   . Heart murmur   . History of breast cancer 2017   right  . History of chemotherapy    finished chemo 04/15/2016  . Personal history of chemotherapy   . Rash 08/04/2016   under  right arm - states recent tick bite; to see MD 08/05/2016  . Recurrent breast cancer, right ([place]) dx'd 10/2018  . Runny nose 06/06/2016   clear drainage, per pt.    Past Surgical History:  Procedure Laterality Date  . AUGMENTATION MAMMAPLASTY    . BREAST ENHANCEMENT SURGERY Bilateral 2005  . BREAST RECONSTRUCTION WITH PLACEMENT OF TISSUE EXPANDER AND FLEX HD (ACELLULAR HYDRATED DERMIS) Right 05/19/2016   Procedure: RIGHT BREAST RECONSTRUCTION WITH PLACEMENT OF TISSUE EXPANDER AND  ALLODERM.;  Surgeon: Irene Limbo, MD;  Location: [facility];  Service: Plastics;  Laterality: Right;  . COLONOSCOPY    . COLONOSCOPY WITH PROPOFOL  10/02/2014  . DEBRIDEMENT AND CLOSURE WOUND Right 06/08/2016   Procedure: DEBRIDEMENT RIGHT MASTECTOMY FLAP, TISSUE EXPANSION OF RIGHT CHEST;  Surgeon: Irene Limbo, MD;  Location: [facility];  Service: Plastics;  Laterality: Right;  . MASTECTOMY    . MASTOPEXY Left 08/11/2016   Procedure: LEFT BREAST MASTOPEXY;  Surgeon: Irene Limbo, MD;  Location: [facility];  Service: Plastics;  Laterality: Left;  . NIPPLE SPARING MASTECTOMY/SENTINAL LYMPH NODE BIOPSY/RECONSTRUCTION/PLACEMENT OF TISSUE EXPANDER Right 05/19/2016   Procedure: RIGHT NIPPLE SPARING MASTECTOMY WITH SENTINAL LYMPH NODE BIOPSY WITH BLUE DYE INJECTION;  Surgeon: Rolm Bookbinder, MD;  Location: [facility];  Service: General;  Laterality: Right;  . PORTACATH PLACEMENT N/A 12/30/2015   Procedure: INSERTION PORT-A-CATH WITH [place];  Surgeon: Rolm Bookbinder, MD;  Location: [facility];  Service: General;  Laterality: N/A;  . REDUCTION MAMMAPLASTY    . REMOVAL OF TISSUE EXPANDER AND PLACEMENT OF IMPLANT Right 08/11/2016   Procedure: REMOVAL OF RIGHT  TISSUE EXPANDER AND PLACEMENT OF IMPLANT;  Surgeon: Irene Limbo, MD;  Location: [facility];  Service: Plastics;  Laterality: Right;    MEDICATIONS: . calcium-vitamin D (OSCAL WITH D)  500-200 MG-UNIT tablet  . denosumab (PROLIA) 60 MG/ML SOLN injection  . Lactobacillus (PROBIOTIC ACIDOPHILUS PO)  . letrozole (FEMARA) 2.5 MG tablet  . omeprazole (PRILOSEC) 20 MG capsule  . Turmeric POWD  . zolpidem (AMBIEN) 5 MG tablet   No current facility-administered medications for this encounter.     Myra Gianotti, PA-C Surgical Short Stay/Anesthesiology [facility] Phone [phone] [facility] Phone [phone] 11/21/2018 1:58 PM

## 2018-11-21 NOTE — Anesthesia Preprocedure Evaluation (Addendum)
Anesthesia Evaluation  Patient identified by MRN, date of birth, ID band Patient awake    Reviewed: Allergy & Precautions, NPO status , Patient's Chart, lab work & pertinent test results  History of Anesthesia Complications Negative for: history of anesthetic complications  Airway Mallampati: II  TM Distance: >3 FB Neck ROM: Full    Dental no notable dental hx.    Pulmonary neg pulmonary ROS,    Pulmonary exam normal        Cardiovascular Normal cardiovascular exam  TTE 11/29/18: EF 60-65%, no valvular abnormalities   Neuro/Psych Anxiety negative neurological ROS     GI/Hepatic Neg liver ROS, GERD  Medicated and Controlled,  Endo/Other  negative endocrine ROS  Renal/GU negative Renal ROS     Musculoskeletal  (+) Arthritis ,   Abdominal   Peds  Hematology negative hematology ROS (+)   Anesthesia Other Findings Day of surgery medications reviewed with the patient.  Right breast ca s/p chemo  Reproductive/Obstetrics                            Anesthesia Physical Anesthesia Plan  ASA: II  Anesthesia Plan: General   Post-op Pain Management: GA combined w/ Regional for post-op pain   Induction: Intravenous  PONV Risk Score and Plan: 4 or greater and Treatment may vary due to age or medical condition, Ondansetron, Dexamethasone and Midazolam  Airway Management Planned: Oral ETT  Additional Equipment:   Intra-op Plan:   Post-operative Plan: Extubation in OR  Informed Consent: I have reviewed the patients History and Physical, chart, labs and discussed the procedure including the risks, benefits and alternatives for the proposed anesthesia with the patient or authorized representative who has indicated his/her understanding and acceptance.     Dental advisory given  Plan Discussed with: CRNA  Anesthesia Plan Comments: (PAT note written 11/21/2018 by Myra Gianotti, PA-C. )      Anesthesia Quick Evaluation

## 2018-11-21 NOTE — Telephone Encounter (Signed)
Scheduled appt per 8/03 sch message - unable to reach pt . Left message for patient with appt date and time

## 2018-11-23 ENCOUNTER — Other Ambulatory Visit (HOSPITAL_COMMUNITY): Payer: Self-pay | Admitting: *Deleted

## 2018-11-23 DIAGNOSIS — C50211 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 female breast: Secondary | ICD-10-CM

## 2018-11-23 NOTE — Progress Notes (Signed)
Pt re-referred back to [place] from cancer center, per Dr Haroldine Laws needs echo and f/u appt with him.  Order placed, will schedule

## 2018-11-24 ENCOUNTER — Ambulatory Visit (INDEPENDENT_AMBULATORY_CARE_PROVIDER_SITE_OTHER): Payer: Medicare Other | Admitting: Psychology

## 2018-11-24 DIAGNOSIS — F064 Anxiety disorder due to known physiological condition: Secondary | ICD-10-CM | POA: Diagnosis not present

## 2018-11-26 ENCOUNTER — Other Ambulatory Visit (HOSPITAL_COMMUNITY)
Admission: RE | Admit: 2018-11-26 | Discharge: 2018-11-26 | Disposition: A | Discharge status: Home / Self Care | Payer: Medicare Other | Source: Ambulatory Visit | Attending: General Surgery | Admitting: General Surgery

## 2018-11-26 DIAGNOSIS — Z20828 Contact with and (suspected) exposure to other viral communicable diseases: Secondary | ICD-10-CM | POA: Diagnosis not present

## 2018-11-26 DIAGNOSIS — Z01812 Encounter for preprocedural laboratory examination: Secondary | ICD-10-CM | POA: Diagnosis not present

## 2018-11-27 LAB — SARS CORONAVIRUS 2 (TAT 6-24 HRS): SARS Coronavirus 2: NEGATIVE

## 2018-11-29 ENCOUNTER — Other Ambulatory Visit: Payer: Self-pay

## 2018-11-29 ENCOUNTER — Encounter: Payer: Self-pay | Admitting: Hematology and Oncology

## 2018-11-29 ENCOUNTER — Ambulatory Visit (HOSPITAL_COMMUNITY)
Admission: RE | Admit: 2018-11-29 | Discharge: 2018-11-29 | Disposition: A | Discharge status: Home / Self Care | Payer: Medicare Other | Source: Ambulatory Visit | Attending: Internal Medicine | Admitting: Internal Medicine

## 2018-11-29 ENCOUNTER — Ambulatory Visit (HOSPITAL_BASED_OUTPATIENT_CLINIC_OR_DEPARTMENT_OTHER)
Admission: RE | Admit: 2018-11-29 | Discharge: 2018-11-29 | Disposition: A | Discharge status: Home / Self Care | Payer: Medicare Other | Source: Ambulatory Visit | Attending: Internal Medicine | Admitting: Internal Medicine

## 2018-11-29 ENCOUNTER — Other Ambulatory Visit: Payer: Medicare Other

## 2018-11-29 VITALS — BP 111/77 | HR 86 | Wt 154.6 lb

## 2018-11-29 DIAGNOSIS — Z853 Personal history of malignant neoplasm of breast: Secondary | ICD-10-CM | POA: Insufficient documentation

## 2018-11-29 DIAGNOSIS — K219 Gastro-esophageal reflux disease without esophagitis: Secondary | ICD-10-CM | POA: Insufficient documentation

## 2018-11-29 DIAGNOSIS — Z01818 Encounter for other preprocedural examination: Secondary | ICD-10-CM | POA: Diagnosis not present

## 2018-11-29 DIAGNOSIS — I429 Cardiomyopathy, unspecified: Secondary | ICD-10-CM | POA: Diagnosis not present

## 2018-11-29 DIAGNOSIS — Z803 Family history of malignant neoplasm of breast: Secondary | ICD-10-CM | POA: Diagnosis not present

## 2018-11-29 DIAGNOSIS — C50211 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 female breast: Secondary | ICD-10-CM | POA: Diagnosis not present

## 2018-11-29 DIAGNOSIS — Z79899 Other long term (current) drug therapy: Secondary | ICD-10-CM | POA: Diagnosis not present

## 2018-11-29 DIAGNOSIS — Z170 Estrogen receptor positive status [ER+]: Secondary | ICD-10-CM | POA: Insufficient documentation

## 2018-11-29 MED ORDER — LOSARTAN POTASSIUM 25 MG PO TABS: 25.0000 mg | ORAL_TABLET | Freq: Every day | ORAL | 3 refills | Status: DC

## 2018-11-29 MED ORDER — CARVEDILOL 3.125 MG PO TABS: 3.1250 mg | ORAL_TABLET | Freq: Two times a day (BID) | ORAL | 3 refills | Status: DC

## 2018-11-29 NOTE — Progress Notes (Signed)
  Echocardiogram 2D Echocardiogram has been performed.  Burnett Kanaris 11/29/2018, 9:59 AM

## 2018-11-29 NOTE — Patient Instructions (Signed)
START Losartan 25mg  (1 tab) every night before bed  START Carvedilol 3.125mg  (1 tab) twice a day  Your physician has requested that you have an echocardiogram. Echocardiography is a painless test that uses sound waves to create images of your heart. It provides your doctor with information about the size and shape of your heart and how well your heart's chambers and valves are working. This procedure takes approximately one hour. There are no restrictions for this procedure.   Your physician recommends that you schedule a follow-up appointment in: 3 months with an ECHO  At the [facility], you and your health needs are our priority. As part of our continuing mission to provide you with exceptional heart care, we have created designated Provider Care Teams. These Care Teams include your primary Cardiologist (physician) and Advanced Practice Providers (APPs- Physician Assistants and Nurse Practitioners) who all work together to provide you with the care you need, when you need it.   You may see any of the following providers on your designated Care Team at your next follow up: Marland Kitchen Dr Glori Bickers . Dr Loralie Champagne . Darrick Grinder, NP   Please be sure to bring in all your medications bottles to every appointment.

## 2018-11-29 NOTE — Progress Notes (Signed)
CARDIO-ONCOLOGY CLINIC NOTE  Referring Physician: Dr. Lindi Adie Primary Cardiologist: None  HPI:  Patricia Chandler is a  63 y.o. female with recurrent right breast cancer referred by Dr. Lindi Adie for enrollment into the Cardio-Oncology program.  Breast cancer history is as follows:      Oncology History  Breast cancer of upper-inner quadrant of right female breast ([place])  12/05/2015 Mammogram   Right breast mass 2:00 4 cm from nipple: 1.7 cm, T1c N0 stage IA; right breast 2:00 6 cm from nipple 1.3 cm mass, 7 mm satellite nodule between the 2 masses, T1 cN0 stage IA   12/06/2015 Initial Diagnosis   Right breast biopsy 2:00 6 cm from nipple: Grade 2-3 invasive ductal cancer, ER 90%, PR 2%, Ki-67 80%, HER-2 positive ratio 2.07; right biopsy 2:00 4 cm from nipple: IDC with DCIS, ER 95%, PR 10%, Ki-67 90%, HER-2 negative ratio 1.27   12/18/2015 Breast MRI   3 enhancing masses in the upper inner quadrant right breast spanning an area 3.7 cm (1.7 cm, 1 cm, 1.4 cm) no lymph node enlargement   12/31/2015 - 04/15/2016 Neo-Adjuvant Chemotherapy   [facility] Perjeta 6 cycles followed by Herceptin, Perjeta maintenance for 1 year completed 10/20/2016   01/08/2016 - 01/12/2016 Hospital Admission   Neutropenic fever hospitalization (because patient did not receive Neulasta with cycle 1)   01/10/2016 Miscellaneous   Genetic testing was normal did not reveal any mutations   04/16/2016 Breast MRI   Near CR to therapy. Previous 2 biopsied massses are no longer seen. Third satellite lesion is smaller from 10 mm to 5.3 mm, no abnormal LN.   05/19/2016 Surgery   Right mastectomy: IDC grade 3, 1.3 cm, low-grade DCIS, LVIDS present, margins negative, 0/1 lymph node negative, ypT1CypN0 stage IA; repeat HER-2 negative ratio 1.41   05/19/2016 Surgery   Right breast reconstruction with tissue expander. Acellular dermis for breast reconstruction (Dr.Thimappa)    06/22/2016 -  Anti-estrogen oral therapy   Letrozole  2.5 mg daily   11/01/2018 Relapse/Recurrence   Patient palpated lump in the UIQ at surgical scar of reconstructed right breast. US of the right breast showed a 1.8cm mass suspicious for recurrent breast cancer. Biopsy showed grade 2 invasive ductal carcinoma, HER-2 negative (1+), ER 90%, PR negative, Ki67 40%.   11/11/2018 Oncotype testing   Oncotype score 52: Greater than 39% risk of distant recurrence with hormone therapy alone, chemo benefit greater than 15%     She was treated in 2017 for R breast cancer received surgery and Herceptin/Perjeta x 9 months. We followed her during her first round of therapy and had mild drop in EF to 50-55% Herceptin stopped transiently and started losartan. Completed Herceptin/Perjeta in 7/18 (got 9 months total) and losartan stopped   Found to have recurrence in 7/20. ER +, PR/HER-2 negative. Pending surgical excision followed by adjuvant chemo with dose dense Adriamycin and Cytoxan x4 followed by Taxol weekly x12 & adjuvant radiation. From functional standpoint doing very well. Walking and doing elliptical without difficulty.   Echo today EF 60-65% -17.6%  Echo (12/26/15) EF 60-65% GLS -22.5% Lateral s' 11.4 cm/s.  Echo 04/02/16 EF 60-65% GLS - 21.3% Lateral s' 10.8 cm/s  Echo 07/01/16 EF 50-55% LS 9.4 cm/s GLS -19.2% ECHO 09/10/2016: EF 60-65% LS 10.4 GLS -21. (reviewed personally)      Past Medical History:  Diagnosis Date  . Anemia    due to chemo  . Anxiety   . Breast cancer ([place]) dx'd 2017  right  . Cardiomyopathy ([place])    mild, per cardiology note; due to Herceptin  . Dental crowns present   . Family history of adverse reaction to anesthesia    pt's daughter has hx. of post-op nausea  . GERD (gastroesophageal reflux disease)   . Heart murmur   . History of breast cancer 2017   right  . History of chemotherapy    finished chemo 04/15/2016  . Personal history of chemotherapy   . Rash 08/04/2016   under right arm - states recent  tick bite; to see MD 08/05/2016  . Recurrent breast cancer, right ([place]) dx'd 10/2018  . Runny nose 06/06/2016   clear drainage, per pt.    Current Outpatient Medications  Medication Sig Dispense Refill  . calcium-vitamin D (OSCAL WITH D) 500-200 MG-UNIT tablet Take 1 tablet by mouth daily with breakfast.    . denosumab (PROLIA) 60 MG/ML SOLN injection Inject 60 mg into the skin every 6 (six) months. Administer in upper arm, thigh, or abdomen    . Lactobacillus (PROBIOTIC ACIDOPHILUS PO) Take 1 capsule by mouth daily.     Marland Kitchen letrozole (FEMARA) 2.5 MG tablet Take 1 tablet (2.5 mg total) by mouth daily. 90 tablet 3  . omeprazole (PRILOSEC) 20 MG capsule Take 20 mg by mouth daily as needed (heartburn).    . Turmeric POWD Take 1 Scoop by mouth daily.    Marland Kitchen zolpidem (AMBIEN) 5 MG tablet Take 1 tablet (5 mg total) by mouth at bedtime as needed. for sleep (Patient taking differently: Take 2.5 mg by mouth at bedtime as needed for sleep. ) 30 tablet 2   No current facility-administered medications for this encounter.     No Known Allergies    Social History   Socioeconomic History  . Marital status: Married    Spouse name: Not on file  . Number of children: 2  . Years of education: Not on file  . Highest education level: Not on file  Occupational History  . Occupation: medical receptionist  Social Needs  . Financial resource strain: Not on file  . Food insecurity    Worry: Not on file    Inability: Not on file  . Transportation needs    Medical: Not on file    Non-medical: Not on file  Tobacco Use  . Smoking status: Never Smoker  . Smokeless tobacco: Never Used  Substance and Sexual Activity  . Alcohol use: Yes    Comment: occasionally  . Drug use: No  . Sexual activity: Not on file  Lifestyle  . Physical activity    Days per week: Not on file    Minutes per session: Not on file  . Stress: Not on file  Relationships  . Social Herbalist on phone: Not on file     Gets together: Not on file    Attends religious service: Not on file    Active member of club or organization: Not on file    Attends meetings of clubs or organizations: Not on file    Relationship status: Not on file  . Intimate partner violence    Fear of current or ex partner: Not on file    Emotionally abused: Not on file    Physically abused: Not on file    Forced sexual activity: Not on file  Other Topics Concern  . Not on file  Social History Narrative   Married 1978 (Al). 2 girls 33 and 36 in 2018. [name] (  liz) lives in [place], [place] with 64 yo son (single right now). and [name] in [place], [place]--> now [place] with husband and 29 year old daughter (born with heart defect with missing valve and initially not closed VSD--still weighs heavily on family- likely will have to be repaired).       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    Graduated from [employer]. [employer] prior.       Hobbies: gardening, decorating, sewing, enjoys creating      Family History  Problem Relation Age of Onset  . Breast cancer Maternal Aunt        dx in her 70s  . Stroke Father        late 14s  . Hypertension Father   . Pulmonary fibrosis Mother   . Breast cancer Paternal Aunt 78  . Cancer Paternal Grandmother        abdominal; cervical vs uterine dx in her 5s  . Stomach cancer Maternal Grandmother   . Parkinson's disease Maternal Grandfather   . Anesthesia problems Daughter        post-op nausea  . Colon cancer Neg Hx   . Esophageal cancer Neg Hx   . Rectal cancer Neg Hx     Vitals:   11/29/18 0957  BP: 111/77  Pulse: 86  SpO2: 97%  Weight: 70.1 kg (154 lb 9.6 oz)    PHYSICAL EXAM: General:  Well appearing. No respiratory difficulty HEENT: normal Neck: supple. no JVD. Carotids 2+ bilat; no bruits. No lymphadenopathy or thryomegaly appreciated. Cor: PMI nondisplaced. Regular rate & rhythm. No rubs,  gallops or murmurs. Lungs: clear Abdomen: soft, nontender, nondistended. No hepatosplenomegaly. No bruits or masses. Good bowel sounds. Extremities: no cyanosis, clubbing, rash, edema Neuro: alert & oriented x 3, cranial nerves grossly intact. moves all 4 extremities w/o difficulty. Affect pleasant.  ECG: 11/18/18 NSR 77 +PACs otherwise normal Personally reviewed   ASSESSMENT & PLAN: 1. Recurrent R Breast Cancer - She was treated in 2017 for R breast cancer received surgery and Herceptin/Perjeta x 1 year.  - We followed her during her first round of therapy and had mild drop in EF to 50-55% Herceptin stopped transiently and started losartan. Completed Herceptin/Perjeta in 7/18 (got 9 months total) and losartan stopped. F/u Echo 60-65% - Found to have recurrence in 7/20. ER positive, PR/HER-2 negative.  - Pending surgical excision followed by adjuvant chemo with dose dense Adriamycin and Cytoxan x4 followed by Taxol weekly x12 & adjuvant radiation - I reviewed echos personally. EF and Doppler parameters stable. No HF on exam. I explained incidence of Adriamycin cardiotoxicity in detail include small possibility of very delayed toxicity. Also explained that risk of cardiac toxicity is higher in setting of previous Herceptin and literature has quoted rates up to 31%  - Will pre-treat with carvedilol 3.125 bid and losartan 25 daily. I discussed possible deferoxamine with Dr. Princella Pellegrini at the cardio-oncology program at [facility] and we both agree that the data doesn't support a true benefit - Will check echo after 3rd round of Adriamycin at 6 months and 1 year post-therapy  Total time spent 35 minutes. Over half that time spent discussing above.   Glori Bickers, MD  9:26 AM

## 2018-11-30 ENCOUNTER — Encounter (HOSPITAL_COMMUNITY)
Admission: RE | Disposition: A | Discharge status: Home / Self Care | Payer: Self-pay | Source: Home / Self Care | Attending: General Surgery

## 2018-11-30 ENCOUNTER — Encounter (HOSPITAL_COMMUNITY)
Admission: RE | Admit: 2018-11-30 | Discharge: 2018-11-30 | Disposition: A | Discharge status: Home / Self Care | Payer: Medicare Other | Source: Ambulatory Visit | Attending: General Surgery | Admitting: General Surgery

## 2018-11-30 ENCOUNTER — Other Ambulatory Visit: Payer: Self-pay

## 2018-11-30 ENCOUNTER — Ambulatory Visit (HOSPITAL_COMMUNITY)
Admission: RE | Admit: 2018-11-30 | Discharge: 2018-11-30 | Disposition: A | Discharge status: Home / Self Care | Payer: Medicare Other | Attending: General Surgery | Admitting: General Surgery

## 2018-11-30 ENCOUNTER — Encounter (HOSPITAL_COMMUNITY): Payer: Self-pay | Admitting: *Deleted

## 2018-11-30 ENCOUNTER — Ambulatory Visit (HOSPITAL_COMMUNITY): Payer: Medicare Other | Admitting: Anesthesiology

## 2018-11-30 ENCOUNTER — Ambulatory Visit (HOSPITAL_COMMUNITY): Payer: Medicare Other

## 2018-11-30 ENCOUNTER — Other Ambulatory Visit (HOSPITAL_COMMUNITY): Payer: Self-pay | Admitting: Otolaryngology

## 2018-11-30 ENCOUNTER — Ambulatory Visit (HOSPITAL_COMMUNITY): Payer: Medicare Other | Admitting: Vascular Surgery

## 2018-11-30 DIAGNOSIS — Z95828 Presence of other vascular implants and grafts: Secondary | ICD-10-CM

## 2018-11-30 DIAGNOSIS — Z170 Estrogen receptor positive status [ER+]: Secondary | ICD-10-CM | POA: Insufficient documentation

## 2018-11-30 DIAGNOSIS — Z9221 Personal history of antineoplastic chemotherapy: Secondary | ICD-10-CM | POA: Diagnosis not present

## 2018-11-30 DIAGNOSIS — Z800 Family history of malignant neoplasm of digestive organs: Secondary | ICD-10-CM | POA: Diagnosis not present

## 2018-11-30 DIAGNOSIS — Z79899 Other long term (current) drug therapy: Secondary | ICD-10-CM | POA: Diagnosis not present

## 2018-11-30 DIAGNOSIS — R011 Cardiac murmur, unspecified: Secondary | ICD-10-CM | POA: Insufficient documentation

## 2018-11-30 DIAGNOSIS — Z808 Family history of malignant neoplasm of other organs or systems: Secondary | ICD-10-CM | POA: Diagnosis not present

## 2018-11-30 DIAGNOSIS — I429 Cardiomyopathy, unspecified: Secondary | ICD-10-CM | POA: Insufficient documentation

## 2018-11-30 DIAGNOSIS — C50911 Malignant neoplasm of unspecified site of right female breast: Secondary | ICD-10-CM | POA: Diagnosis not present

## 2018-11-30 DIAGNOSIS — D499 Neoplasm of unspecified behavior of unspecified site: Secondary | ICD-10-CM

## 2018-11-30 DIAGNOSIS — K219 Gastro-esophageal reflux disease without esophagitis: Secondary | ICD-10-CM | POA: Diagnosis not present

## 2018-11-30 DIAGNOSIS — C50211 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 female breast: Secondary | ICD-10-CM | POA: Diagnosis not present

## 2018-11-30 DIAGNOSIS — Z803 Family history of malignant neoplasm of breast: Secondary | ICD-10-CM | POA: Diagnosis not present

## 2018-11-30 DIAGNOSIS — M1990 Unspecified osteoarthritis, unspecified site: Secondary | ICD-10-CM | POA: Diagnosis not present

## 2018-11-30 DIAGNOSIS — F419 Anxiety disorder, unspecified: Secondary | ICD-10-CM | POA: Insufficient documentation

## 2018-11-30 DIAGNOSIS — Z8049 Family history of malignant neoplasm of other genital organs: Secondary | ICD-10-CM | POA: Diagnosis not present

## 2018-11-30 DIAGNOSIS — Z853 Personal history of malignant neoplasm of breast: Secondary | ICD-10-CM | POA: Diagnosis not present

## 2018-11-30 DIAGNOSIS — Z9011 Acquired absence of right breast and nipple: Secondary | ICD-10-CM | POA: Insufficient documentation

## 2018-11-30 DIAGNOSIS — G8918 Other acute postprocedural pain: Secondary | ICD-10-CM | POA: Diagnosis not present

## 2018-11-30 DIAGNOSIS — Z452 Encounter for adjustment and management of vascular access device: Secondary | ICD-10-CM | POA: Diagnosis not present

## 2018-11-30 HISTORY — PX: BREAST BIOPSY: SHX20

## 2018-11-30 HISTORY — PX: OTHER SURGICAL HISTORY: SHX169

## 2018-11-30 HISTORY — PX: PORTACATH PLACEMENT: SHX2246

## 2018-11-30 SURGERY — BREAST BIOPSY
Anesthesia: General | Laterality: Right

## 2018-11-30 MED ORDER — SUGAMMADEX SODIUM 200 MG/2ML IV SOLN
INTRAVENOUS | Status: DC | PRN
  Administered 2018-11-30: 140.2 mg via INTRAVENOUS

## 2018-11-30 MED ORDER — FENTANYL CITRATE (PF) 250 MCG/5ML IJ SOLN
INTRAMUSCULAR | Status: AC
  Filled 2018-11-30: qty 5

## 2018-11-30 MED ORDER — MIDAZOLAM HCL 2 MG/2ML IJ SOLN
INTRAMUSCULAR | Status: DC | PRN
  Administered 2018-11-30: 2 mg via INTRAVENOUS

## 2018-11-30 MED ORDER — PROPOFOL 10 MG/ML IV BOLUS
INTRAVENOUS | Status: DC | PRN
  Administered 2018-11-30: 40 mg via INTRAVENOUS
  Administered 2018-11-30: 120 mg via INTRAVENOUS

## 2018-11-30 MED ORDER — BUPIVACAINE HCL (PF) 0.25 % IJ SOLN
INTRAMUSCULAR | Status: AC
  Filled 2018-11-30: qty 30

## 2018-11-30 MED ORDER — SODIUM CHLORIDE 0.9% FLUSH: 3.0000 mL | INTRAVENOUS | Status: DC | PRN

## 2018-11-30 MED ORDER — OXYCODONE HCL 5 MG PO TABS
ORAL_TABLET | ORAL | Status: AC
  Filled 2018-11-30: qty 1

## 2018-11-30 MED ORDER — HEPARIN SOD (PORK) LOCK FLUSH 100 UNIT/ML IV SOLN
INTRAVENOUS | Status: AC
  Filled 2018-11-30: qty 5

## 2018-11-30 MED ORDER — DEXAMETHASONE SODIUM PHOSPHATE 10 MG/ML IJ SOLN
INTRAMUSCULAR | Status: AC
  Filled 2018-11-30: qty 1

## 2018-11-30 MED ORDER — GABAPENTIN 100 MG PO CAPS
100.0000 mg | ORAL_CAPSULE | ORAL | Status: AC
  Administered 2018-11-30: 100 mg via ORAL
  Filled 2018-11-30: qty 1

## 2018-11-30 MED ORDER — OXYCODONE HCL 5 MG PO TABS
5.0000 mg | ORAL_TABLET | Freq: Once | ORAL | Status: AC | PRN
  Administered 2018-11-30: 5 mg via ORAL

## 2018-11-30 MED ORDER — FENTANYL CITRATE (PF) 250 MCG/5ML IJ SOLN
INTRAMUSCULAR | Status: DC | PRN
  Administered 2018-11-30 (×2): 50 ug via INTRAVENOUS

## 2018-11-30 MED ORDER — DEXAMETHASONE SODIUM PHOSPHATE 10 MG/ML IJ SOLN
INTRAMUSCULAR | Status: DC | PRN
  Administered 2018-11-30: 4 mg via INTRAVENOUS

## 2018-11-30 MED ORDER — HEPARIN SOD (PORK) LOCK FLUSH 100 UNIT/ML IV SOLN
INTRAVENOUS | Status: DC | PRN
  Administered 2018-11-30: 500 [IU] via INTRAVENOUS

## 2018-11-30 MED ORDER — BUPIVACAINE-EPINEPHRINE (PF) 0.5% -1:200000 IJ SOLN
INTRAMUSCULAR | Status: DC | PRN
  Administered 2018-11-30: 30 mL via PERINEURAL

## 2018-11-30 MED ORDER — LIDOCAINE 2% (20 MG/ML) 5 ML SYRINGE
INTRAMUSCULAR | Status: DC | PRN
  Administered 2018-11-30: 60 mg via INTRAVENOUS

## 2018-11-30 MED ORDER — SODIUM CHLORIDE 0.9 % IV SOLN
INTRAVENOUS | Status: AC
  Filled 2018-11-30: qty 1.2

## 2018-11-30 MED ORDER — ACETAMINOPHEN 650 MG RE SUPP: 650.0000 mg | RECTAL | Status: DC | PRN

## 2018-11-30 MED ORDER — ACETAMINOPHEN 325 MG PO TABS: 650.0000 mg | ORAL_TABLET | ORAL | Status: DC | PRN

## 2018-11-30 MED ORDER — SODIUM CHLORIDE 0.9 % IV SOLN
INTRAVENOUS | Status: DC | PRN
  Administered 2018-11-30: 500 mL

## 2018-11-30 MED ORDER — OXYCODONE HCL 5 MG PO TABS: 5.0000 mg | ORAL_TABLET | ORAL | 0 refills | Status: DC | PRN

## 2018-11-30 MED ORDER — ENSURE PRE-SURGERY PO LIQD: 296.0000 mL | Freq: Once | ORAL | Status: DC

## 2018-11-30 MED ORDER — CEFAZOLIN SODIUM-DEXTROSE 2-4 GM/100ML-% IV SOLN
2.0000 g | INTRAVENOUS | Status: AC
  Administered 2018-11-30: 09:00:00 2 g via INTRAVENOUS
  Filled 2018-11-30: qty 100

## 2018-11-30 MED ORDER — PROMETHAZINE HCL 25 MG/ML IJ SOLN: 6.2500 mg | INTRAMUSCULAR | Status: DC | PRN

## 2018-11-30 MED ORDER — ONDANSETRON HCL 4 MG/2ML IJ SOLN
INTRAMUSCULAR | Status: DC | PRN
  Administered 2018-11-30: 4 mg via INTRAVENOUS

## 2018-11-30 MED ORDER — BUPIVACAINE-EPINEPHRINE 0.25% -1:200000 IJ SOLN
INTRAMUSCULAR | Status: DC | PRN
  Administered 2018-11-30: .1 mL

## 2018-11-30 MED ORDER — MORPHINE SULFATE (PF) 2 MG/ML IV SOLN: 1.0000 mg | INTRAVENOUS | Status: DC | PRN

## 2018-11-30 MED ORDER — FENTANYL CITRATE (PF) 100 MCG/2ML IJ SOLN: 25.0000 ug | INTRAMUSCULAR | Status: DC | PRN

## 2018-11-30 MED ORDER — BUPIVACAINE HCL 0.25 % IJ SOLN
INTRAMUSCULAR | Status: DC | PRN
  Administered 2018-11-30: 9 mL

## 2018-11-30 MED ORDER — TECHNETIUM TC 99M SULFUR COLLOID FILTERED
1.0000 | Freq: Once | INTRAVENOUS | Status: AC | PRN
  Administered 2018-11-30: 1 via INTRADERMAL

## 2018-11-30 MED ORDER — ACETAMINOPHEN 10 MG/ML IV SOLN: 1000.0000 mg | Freq: Once | INTRAVENOUS | Status: DC | PRN

## 2018-11-30 MED ORDER — PROPOFOL 10 MG/ML IV BOLUS
INTRAVENOUS | Status: AC
  Filled 2018-11-30: qty 20

## 2018-11-30 MED ORDER — 0.9 % SODIUM CHLORIDE (POUR BTL) OPTIME
TOPICAL | Status: DC | PRN
  Administered 2018-11-30: 1000 mL

## 2018-11-30 MED ORDER — BUPIVACAINE-EPINEPHRINE (PF) 0.25% -1:200000 IJ SOLN
INTRAMUSCULAR | Status: AC
  Filled 2018-11-30: qty 30

## 2018-11-30 MED ORDER — SODIUM CHLORIDE 0.9 % IV SOLN: 250.0000 mL | INTRAVENOUS | Status: DC | PRN

## 2018-11-30 MED ORDER — OXYCODONE HCL 5 MG/5ML PO SOLN: 5.0000 mg | Freq: Once | ORAL | Status: AC | PRN

## 2018-11-30 MED ORDER — LACTATED RINGERS IV SOLN
INTRAVENOUS | Status: DC | PRN
  Administered 2018-11-30: 08:00:00 via INTRAVENOUS

## 2018-11-30 MED ORDER — OXYCODONE HCL 5 MG PO TABS: 5.0000 mg | ORAL_TABLET | ORAL | Status: DC | PRN

## 2018-11-30 MED ORDER — ROCURONIUM BROMIDE 10 MG/ML (PF) SYRINGE
PREFILLED_SYRINGE | INTRAVENOUS | Status: DC | PRN
  Administered 2018-11-30: 50 mg via INTRAVENOUS

## 2018-11-30 MED ORDER — ONDANSETRON HCL 4 MG/2ML IJ SOLN
INTRAMUSCULAR | Status: AC
  Filled 2018-11-30: qty 2

## 2018-11-30 MED ORDER — MIDAZOLAM HCL 2 MG/2ML IJ SOLN
INTRAMUSCULAR | Status: AC
  Filled 2018-11-30: qty 2

## 2018-11-30 MED ORDER — ACETAMINOPHEN 500 MG PO TABS
1000.0000 mg | ORAL_TABLET | ORAL | Status: AC
  Administered 2018-11-30: 1000 mg via ORAL
  Filled 2018-11-30: qty 2

## 2018-11-30 SURGICAL SUPPLY — 68 items
ADH SKN CLS APL DERMABOND .7 (GAUZE/BANDAGES/DRESSINGS) ×2
APL PRP STRL LF DISP 70% ISPRP (MISCELLANEOUS) ×2
APPLIER CLIP 9.375 MED OPEN (MISCELLANEOUS) ×3
APR CLP MED 9.3 20 MLT OPN (MISCELLANEOUS) ×2
BAG DECANTER FOR FLEXI CONT (MISCELLANEOUS) ×3 IMPLANT
BINDER BREAST LRG (GAUZE/BANDAGES/DRESSINGS) IMPLANT
BINDER BREAST XLRG (GAUZE/BANDAGES/DRESSINGS) IMPLANT
BLADE CLIPPER SURG (BLADE) IMPLANT
CANISTER SUCT 3000ML PPV (MISCELLANEOUS) IMPLANT
CHLORAPREP W/TINT 10.5 ML (MISCELLANEOUS) ×3 IMPLANT
CHLORAPREP W/TINT 26 (MISCELLANEOUS) ×3 IMPLANT
CLIP APPLIE 9.375 MED OPEN (MISCELLANEOUS) ×1 IMPLANT
CLOSURE STERI-STRIP 1/4X4 (GAUZE/BANDAGES/DRESSINGS) ×2 IMPLANT
CONT SPEC 4OZ CLIKSEAL STRL BL (MISCELLANEOUS) IMPLANT
COVER SURGICAL LIGHT HANDLE (MISCELLANEOUS) ×3 IMPLANT
COVER TRANSDUCER ULTRASND GEL (DRAPE) ×3 IMPLANT
COVER WAND RF STERILE (DRAPES) ×3 IMPLANT
DECANTER SPIKE VIAL GLASS SM (MISCELLANEOUS) ×3 IMPLANT
DERMABOND ADVANCED (GAUZE/BANDAGES/DRESSINGS) ×1
DERMABOND ADVANCED .7 DNX12 (GAUZE/BANDAGES/DRESSINGS) ×2 IMPLANT
DRAPE C-ARM 42X72 X-RAY (DRAPES) ×3 IMPLANT
DRAPE CHEST BREAST 15X10 FENES (DRAPES) ×3 IMPLANT
ELECT CAUTERY BLADE 6.4 (BLADE) ×3 IMPLANT
ELECT REM PT RETURN 9FT ADLT (ELECTROSURGICAL) ×3
ELECTRODE REM PT RTRN 9FT ADLT (ELECTROSURGICAL) ×2 IMPLANT
GAUZE 4X4 16PLY RFD (DISPOSABLE) ×3 IMPLANT
GEL ULTRASOUND 20GR AQUASONIC (MISCELLANEOUS) ×3 IMPLANT
GLOVE BIO SURGEON STRL SZ7 (GLOVE) ×3 IMPLANT
GLOVE BIOGEL PI IND STRL 7.5 (GLOVE) ×2 IMPLANT
GLOVE BIOGEL PI INDICATOR 7.5 (GLOVE) ×1
GLOVE SS BIOGEL STRL SZ 7.5 (GLOVE) ×1 IMPLANT
GLOVE SUPERSENSE BIOGEL SZ 7.5 (GLOVE) ×1
GOWN STRL REUS W/ TWL LRG LVL3 (GOWN DISPOSABLE) ×4 IMPLANT
GOWN STRL REUS W/TWL LRG LVL3 (GOWN DISPOSABLE) ×6
INTRODUCER COOK 11FR (CATHETERS) IMPLANT
KIT BASIN OR (CUSTOM PROCEDURE TRAY) ×3 IMPLANT
KIT PORT POWER 8FR ISP CVUE (Port) ×3 IMPLANT
KIT TURNOVER KIT B (KITS) ×3 IMPLANT
NDL HYPO 25GX1X1/2 BEV (NEEDLE) ×1 IMPLANT
NEEDLE HYPO 25GX1X1/2 BEV (NEEDLE) ×3 IMPLANT
NS IRRIG 1000ML POUR BTL (IV SOLUTION) ×3 IMPLANT
PACK GENERAL/GYN (CUSTOM PROCEDURE TRAY) ×3 IMPLANT
PAD ARMBOARD 7.5X6 YLW CONV (MISCELLANEOUS) ×6 IMPLANT
PENCIL BUTTON HOLSTER BLD 10FT (ELECTRODE) ×3 IMPLANT
PENCIL SMOKE EVACUATOR (MISCELLANEOUS) ×3 IMPLANT
POSITIONER HEAD DONUT 9IN (MISCELLANEOUS) ×3 IMPLANT
SET INTRODUCER 12FR PACEMAKER (INTRODUCER) IMPLANT
SET SHEATH INTRODUCER 10FR (MISCELLANEOUS) IMPLANT
SHEATH COOK PEEL AWAY SET 9F (SHEATH) IMPLANT
SPONGE LAP 4X18 RFD (DISPOSABLE) ×3 IMPLANT
STRIP CLOSURE SKIN 1/2X4 (GAUZE/BANDAGES/DRESSINGS) ×3 IMPLANT
SUT MNCRL AB 4-0 PS2 18 (SUTURE) ×3 IMPLANT
SUT PROLENE 2 0 SH DA (SUTURE) ×3 IMPLANT
SUT SILK 2 0 (SUTURE)
SUT SILK 2 0 SH (SUTURE) IMPLANT
SUT SILK 2-0 18XBRD TIE 12 (SUTURE) IMPLANT
SUT VIC AB 2-0 SH 18 (SUTURE) ×1 IMPLANT
SUT VIC AB 2-0 SH 27 (SUTURE) ×3
SUT VIC AB 2-0 SH 27X BRD (SUTURE) ×2 IMPLANT
SUT VIC AB 3-0 SH 18 (SUTURE) ×1 IMPLANT
SUT VIC AB 3-0 SH 27 (SUTURE) ×3
SUT VIC AB 3-0 SH 27XBRD (SUTURE) ×2 IMPLANT
SYR 20ML LL LF (SYRINGE) ×4 IMPLANT
SYR 5ML LUER SLIP (SYRINGE) ×3 IMPLANT
SYR CONTROL 10ML LL (SYRINGE) ×3 IMPLANT
TOWEL GREEN STERILE (TOWEL DISPOSABLE) ×3 IMPLANT
TOWEL GREEN STERILE FF (TOWEL DISPOSABLE) ×3 IMPLANT
TRAY LAPAROSCOPIC MC (CUSTOM PROCEDURE TRAY) ×3 IMPLANT

## 2018-11-30 NOTE — Transfer of Care (Signed)
Immediate Anesthesia Transfer of Care Note  Patient: Patricia Chandler  Procedure(s) Performed: RIGHT BREAST CHEST WALL RECURRENCE EXCISION (Right ) INSERTION PORT-A-CATH WITH ULTRASOUND (N/A )  Patient Location: PACU  Anesthesia Type:GA combined with regional for post-op pain  Level of Consciousness: awake, alert  and patient cooperative  Airway & Oxygen Therapy: Patient Spontanous Breathing and Patient connected to nasal cannula oxygen  Post-op Assessment: Report given to RN and Post -op Vital signs reviewed and stable  Post vital signs: Reviewed and stable  Last Vitals:  Vitals Value Taken Time  BP 119/80 11/30/18 1005  Temp    Pulse 94 11/30/18 1006  Resp 13 11/30/18 1006  SpO2 100 % 11/30/18 1006  Vitals shown include unvalidated device data.  Last Pain:  Vitals:   11/30/18 0729  TempSrc:   PainSc: 0-No pain         Complications: No apparent anesthesia complications

## 2018-11-30 NOTE — H&P (Signed)
Patricia Chandler is an 63 y.o. female.   Chief Complaint: breast cancer HPI:  1 yof who in 2017 had a right breast mass identified this was g2-3 idc with dcis that was er pos at 95%, pr pos at 10%, her 2 positive she had 3 enhancing masses on mri and we did primary tchp. genetics were normal. the tumor was right upper inner quadrant. she did have right nsm with sn biopsy that showed residual 1.3 cm idc grade 3. only one sn negative. repeat her 2 was negative. she had reconstruction that has been completed with implant. she did lose nac. she has been on letrozole since then and has seen oncology. she saw oncology early july after noting a mass in right upper inner quadrant that has been present for some time. she had [place] that shows a 1.8 cm mass in the ruiq and normal nodes. this has undergone core biopsy with finding of a grade II invasive mammary carcinoma, remainder of path pending. she is here with her husband to discuss options   Past Medical History:  Diagnosis Date  . Anemia    due to chemo  . Anxiety   . Breast cancer ([place]) dx'd 2017   right  . Cardiomyopathy ([place])    mild, per cardiology note; due to Herceptin  . Dental crowns present   . Family history of adverse reaction to anesthesia    pt's daughter has hx. of post-op nausea  . GERD (gastroesophageal reflux disease)   . Heart murmur   . History of breast cancer 2017   right  . History of chemotherapy    finished chemo 04/15/2016  . Personal history of chemotherapy   . Rash 08/04/2016   under right arm - states recent tick bite; to see MD 08/05/2016  . Recurrent breast cancer, right ([place]) dx'd 10/2018  . Runny nose 06/06/2016   clear drainage, per pt.    Past Surgical History:  Procedure Laterality Date  . AUGMENTATION MAMMAPLASTY    . BREAST ENHANCEMENT SURGERY Bilateral 2005  . BREAST RECONSTRUCTION WITH PLACEMENT OF TISSUE EXPANDER AND FLEX HD (ACELLULAR HYDRATED DERMIS) Right 05/19/2016   Procedure: RIGHT BREAST RECONSTRUCTION WITH PLACEMENT OF TISSUE EXPANDER AND  ALLODERM.;  Surgeon: Irene Limbo, MD;  Location: [facility];  Service: Plastics;  Laterality: Right;  . COLONOSCOPY    . COLONOSCOPY WITH PROPOFOL  10/02/2014  . DEBRIDEMENT AND CLOSURE WOUND Right 06/08/2016   Procedure: DEBRIDEMENT RIGHT MASTECTOMY FLAP, TISSUE EXPANSION OF RIGHT CHEST;  Surgeon: Irene Limbo, MD;  Location: [facility];  Service: Plastics;  Laterality: Right;  . MASTECTOMY    . MASTOPEXY Left 08/11/2016   Procedure: LEFT BREAST MASTOPEXY;  Surgeon: Irene Limbo, MD;  Location: [facility];  Service: Plastics;  Laterality: Left;  . NIPPLE SPARING MASTECTOMY/SENTINAL LYMPH NODE BIOPSY/RECONSTRUCTION/PLACEMENT OF TISSUE EXPANDER Right 05/19/2016   Procedure: RIGHT NIPPLE SPARING MASTECTOMY WITH SENTINAL LYMPH NODE BIOPSY WITH BLUE DYE INJECTION;  Surgeon: Rolm Bookbinder, MD;  Location: [facility];  Service: General;  Laterality: Right;  . PORTACATH PLACEMENT N/A 12/30/2015   Procedure: INSERTION PORT-A-CATH WITH [place];  Surgeon: Rolm Bookbinder, MD;  Location: [facility];  Service: General;  Laterality: N/A;  . REDUCTION MAMMAPLASTY    . REMOVAL OF TISSUE EXPANDER AND PLACEMENT OF IMPLANT Right 08/11/2016   Procedure: REMOVAL OF RIGHT  TISSUE EXPANDER AND PLACEMENT OF IMPLANT;  Surgeon: Irene Limbo, MD;  Location: [facility];  Service: Plastics;  Laterality: Right;    Family History  Problem Relation Age of Onset  . Breast cancer Maternal Aunt        dx in her 46s  . Stroke Father        late 75s  . Hypertension Father   . Pulmonary fibrosis Mother   . Breast cancer Paternal Aunt 78  . Cancer Paternal Grandmother        abdominal; cervical vs uterine dx in her 49s  . Stomach cancer Maternal Grandmother   . Parkinson's disease Maternal Grandfather   . Anesthesia problems Daughter        post-op nausea  . Colon  cancer Neg Hx   . Esophageal cancer Neg Hx   . Rectal cancer Neg Hx    Social History:  reports that she has never smoked. She has never used smokeless tobacco. She reports current alcohol use. She reports that she does not use drugs.  Allergies: No Known Allergies  Medications Prior to Admission  Medication Sig Dispense Refill  . calcium-vitamin D (OSCAL WITH D) 500-200 MG-UNIT tablet Take 1 tablet by mouth daily with breakfast.    . Lactobacillus (PROBIOTIC ACIDOPHILUS PO) Take 1 capsule by mouth daily.     Marland Kitchen letrozole (FEMARA) 2.5 MG tablet Take 1 tablet (2.5 mg total) by mouth daily. 90 tablet 3  . omeprazole (PRILOSEC) 20 MG capsule Take 20 mg by mouth daily as needed (heartburn).    . Turmeric POWD Take 1 Scoop by mouth daily.    Marland Kitchen zolpidem (AMBIEN) 5 MG tablet Take 1 tablet (5 mg total) by mouth at bedtime as needed. for sleep (Patient taking differently: Take 2.5 mg by mouth at bedtime as needed for sleep. ) 30 tablet 2  . carvedilol (COREG) 3.125 MG tablet Take 1 tablet (3.125 mg total) by mouth 2 (two) times daily. 180 tablet 3  . denosumab (PROLIA) 60 MG/ML SOLN injection Inject 60 mg into the skin every 6 (six) months. Administer in upper arm, thigh, or abdomen    . losartan (COZAAR) 25 MG tablet Take 1 tablet (25 mg total) by mouth at bedtime. 90 tablet 3    No results found for this or any previous visit (from the past 48 hour(s)). No results found.  Review of Systems  All other systems reviewed and are negative.   There were no vitals taken for this visit. Physical Exam  General Mental Status-Alert. Orientation-Oriented X3. Breast Nipples Discharge - Left - None - Left. Note: riuq breast mass about 2 cm in size, mobile, implant in place Cardiovascular Cardiovascular examination reveals -normal heart sounds, regular rate and rhythm with no murmurs. Neurologic Neurologic evaluation reveals -alert and oriented x 3 with no impairment of recent or remote  memory. Lymphatic Head & Neck General Head & Neck Lymphatics: Bilateral - Description - Normal. Axillary General Axillary Region: Bilateral - Description - Normal. Note: no [place] adenopathy    Assessment/Plan RECURRENT BREAST CANCER, RIGHT (C50.911) wle tumor, attempt sn biopsy Story: we discussed recurrence. she has metastatic workup from oncology pending with ct c/a/p and bone scan. I will also obtain an mri. I think likely just needs to proceed with wide local excision and possible sn biopsy (attempt to repeat).  I discussed wle with risk of pos margin necessitating further surgery. I also think will need radiotherapy this time as well.    Rolm Bookbinder, MD 11/30/2018, 6:41 AM

## 2018-11-30 NOTE — Op Note (Signed)
Preoperative diagnosis: recurrent right breast cancer s/p nsm Postoperative diagnosis: saa Procedure: 1. Right breast lumpectomy 2. Insertion of right IJ port with US guidance Surgeon: Dr Serita Grammes Anesthesia: general Complications none Drains none Specimens right breast tissue marked with paint Sponge and needle count correct times two Dispo to recovery stable  Indications: This is a 63 year old female I know from a prior nipple sparing mastectomy for breast cancer.  This was done in 2017.  At that point she had primary [facility] P followed by the nipple sparing mastectomy.  She had a negative sentinel lymph node.  She had a reconstruction that is since been completed with an implant.  She did lose her nipple areolar complex afterwards.  She has been on letrozole and has noted for some time a right upper inner quadrant mass.  An ultrasound shows this to be 1.8 cm with normal axillary nodes.  This is been biopsied and is a grade 2 invasive ductal carcinoma.  She has seen oncology and will get chemotherapy.  Her staging scans are negative.  MRI shows no evidence of any other disease or implant issues.  We discussed a wide local excision as well as a port placement and an attempt at a sentinel lymph node biopsy.  Procedure: After informed consent was obtained the patient was first injected with technetium in the upper outer quadrant of the right breast due to her prior surgery.  She then underwent general anesthesia.  She had been given antibiotics.  SCDs were in place.  She was then prepped and draped in the standard sterile surgical fashion.  Surgical timeout was then performed.  I first attempted to identify a sentinel lymph node with the neoprobe.  There was no radioactivity in her axilla.  Due to the fact that she had normal lymph nodes by ultrasound, exam, and MRI I elected not to proceed with a lymph node biopsy.  I then did a wide local excision of the recurrent right upper inner quadrant  tumor.  I infiltrated Marcaine and made an elliptical incision to remove the skin overlying this.  I then could palpate the tumor grossly.  I took margins that were clear grossly.  I took this all the way down to the pectoralis muscle and this was not adherent to the pectoralis muscle clinically.  It came off easily.  There was some concern about this on the MRI.  I then marked this with pain and passed off the table.  I then obtained hemostasis.  I closed this with 2-0 Vicryl, 3-0 Vicryl, and 4-0 Monocryl.  Glue and Steri-Strips were eventually placed.  I then did the Port-A-Cath.  I used the ultrasound to identify the internal jugular vein.  I then made a small nick in the skin.  I accessed the vein with a needle under direct vision.  I then placed the wire.  The wire was confirmed to be in position by both the ultrasound and fluoroscopy.  I then remove the needle.  I used her old port site.  I infiltrated Marcaine and made an incision.  I developed the pocket.  I then tunneled between this and the insertion site.  I then placed the dilator using fluoroscopic guidance over the wire.  The wire assembly was then removed.  The line was placed in the dilator.  The peel-away sheath was then removed.  I pulled the line back to be in the superior vena cava.  I then attached the port to this.  I sewed  this in place with 2-0 Prolene suture.  This aspirated blood and I flushed it easily.  I packed it with heparin.  I then closed this with 3-0 Vicryl and 4-0 Monocryl.  Glue and Steri-Strips were applied.  She tolerated this well was extubated and transferred to recovery stable.

## 2018-11-30 NOTE — Anesthesia Procedure Notes (Signed)
Anesthesia Regional Block: Pectoralis block   Pre-Anesthetic Checklist: ,, timeout performed, Correct Patient, Correct Site, Correct Laterality, Correct Procedure, Correct Position, site marked, Risks and benefits discussed, pre-op evaluation,  At surgeon's request and post-op pain management  Laterality: Right  Prep: Maximum Sterile Barrier Precautions used, chloraprep       Needles:  Injection technique: Single-shot  Needle Type: Echogenic Stimulator Needle     Needle Length: 9cm  Needle Gauge: 22     Additional Needles:   Procedures:,,,, ultrasound used (permanent image in chart),,,,  Narrative:  Start time: 11/30/2018 7:58 AM End time: 11/30/2018 8:00 AM Injection made incrementally with aspirations every 5 mL.  Performed by: Personally  Anesthesiologist: Brennan Bailey, MD  Additional Notes: Risks, benefits, and alternative discussed. Patient gave consent for procedure. Patient prepped and draped in sterile fashion. Sedation administered, patient remains easily responsive to voice. Relevant anatomy identified with ultrasound guidance. Local anesthetic given in 5cc increments with no signs or symptoms of intravascular injection. No pain or paraesthesias with injection. Patient monitored throughout procedure with signs of LAST or immediate complications. Tolerated well. Ultrasound image placed in chart.  Tawny Asal, MD

## 2018-11-30 NOTE — Discharge Instructions (Signed)
[facility] °Office Phone Number [phone] ° °POST OP INSTRUCTIONS °Take 400 mg of ibuprofen every 8 hours or 650 mg tylenol every 6 hours for next 72 hours then as needed. Use ice several times daily also. °Always review your discharge instruction sheet given to you by the facility where your surgery was performed. ° °IF YOU HAVE DISABILITY OR FAMILY LEAVE FORMS, YOU MUST BRING THEM TO THE OFFICE FOR PROCESSING.  DO NOT GIVE THEM TO YOUR DOCTOR. ° °1. A prescription for pain medication may be given to you upon discharge.  Take your pain medication as prescribed, if needed.  If narcotic pain medicine is not needed, then you may take acetaminophen (Tylenol), naprosyn (Alleve) or ibuprofen (Advil) as needed. °2. Take your usually prescribed medications unless otherwise directed °3. If you need a refill on your pain medication, please contact your pharmacy.  They will contact our office to request authorization.  Prescriptions will not be filled after 5pm or on week-ends. °4. You should eat very light the first 24 hours after surgery, such as soup, crackers, pudding, etc.  Resume your normal diet the day after surgery. °5. Most patients will experience some swelling and bruising in the breast.  Ice packs and a good support bra will help.  Wear the breast binder provided or a sports bra for 72 hours day and night.  After that wear a sports bra during the day until you return to the office. Swelling and bruising can take several days to resolve.  °6. It is common to experience some constipation if taking pain medication after surgery.  Increasing fluid intake and taking a stool softener will usually help or prevent this problem from occurring.  A mild laxative (Milk of Magnesia or Miralax) should be taken according to package directions if there are no bowel movements after 48 hours. °7. Unless discharge instructions indicate otherwise, you may remove your bandages 48 hours after surgery and you may  shower at that time.  You may have steri-strips (small skin tapes) in place directly over the incision.  These strips should be left on the skin for 7-10 days and will come off on their own.  If your surgeon used skin glue on the incision, you may shower in 24 hours.  The glue will flake off over the next 2-3 weeks.  Any sutures or staples will be removed at the office during your follow-up visit. °8. ACTIVITIES:  You may resume regular daily activities (gradually increasing) beginning the next day.  Wearing a good support bra or sports bra minimizes pain and swelling.  You may have sexual intercourse when it is comfortable. °a. You may drive when you no longer are taking prescription pain medication, you can comfortably wear a seatbelt, and you can safely maneuver your car and apply brakes. °b. RETURN TO WORK:  ______________________________________________________________________________________ °9. You should see your doctor in the office for a follow-up appointment approximately two weeks after your surgery.  Your doctor’s nurse will typically make your follow-up appointment when she calls you with your pathology report.  Expect your pathology report 3-4 business days after your surgery.  You may call to check if you do not hear from us after three days. °10. OTHER INSTRUCTIONS: _______________________________________________________________________________________________ _____________________________________________________________________________________________________________________________________ °_____________________________________________________________________________________________________________________________________ °_____________________________________________________________________________________________________________________________________ ° °WHEN TO CALL DR Marsi Turvey: °1. Fever over 101.0 °2. Nausea and/or vomiting. °3. Extreme swelling or bruising. °4. Continued bleeding from  incision. °5. Increased pain, redness, or drainage from the incision. ° °The clinic staff is available to   answer your questions during regular business hours.  Please don’t hesitate to call and ask to speak to one of the nurses for clinical concerns.  If you have a medical emergency, go to the nearest emergency room or call 911.  A surgeon from [facility] is always on call at the hospital. ° °For further questions, please visit [email or web address] ° °

## 2018-11-30 NOTE — Interval H&P Note (Signed)
History and Physical Interval Note:  11/30/2018 6:43 AM  Durwin Reges  has presented today for surgery, with the diagnosis of right breast cancer.  The various methods of treatment have been discussed with the patient and family. After consideration of risks, benefits and other options for treatment, the patient has consented to  Procedure(s): RIGHT BREAST CHEST WALL RECURRENCE EXCISION (Right) RIGHT AXILLARY SENTINEL NODE BIOPSY (Right) INSERTION PORT-A-CATH WITH ULTRASOUND (N/A) as a surgical intervention.  The patient's history has been reviewed, patient examined, no change in status, stable for surgery.  I have reviewed the patient's chart and labs.  Questions were answered to the patient's satisfaction.     Rolm Bookbinder

## 2018-11-30 NOTE — Anesthesia Procedure Notes (Signed)
Procedure Name: Intubation Date/Time: 11/30/2018 8:36 AM Performed by: Renato Shin, CRNA Pre-anesthesia Checklist: Patient identified, Emergency Drugs available, Suction available and Patient being monitored Patient Re-evaluated:Patient Re-evaluated prior to induction Oxygen Delivery Method: Circle system utilized Preoxygenation: Pre-oxygenation with 100% oxygen Induction Type: IV induction Ventilation: Mask ventilation without difficulty Laryngoscope Size: Miller and 2 Grade View: Grade I Tube type: Oral Tube size: 6.5 mm Number of attempts: 1 Airway Equipment and Method: Stylet and Oral airway Placement Confirmation: ETT inserted through vocal cords under direct vision,  positive ETCO2 and breath sounds checked- equal and bilateral Secured at: 22 cm Tube secured with: Tape Dental Injury: Teeth and Oropharynx as per pre-operative assessment

## 2018-11-30 NOTE — Anesthesia Postprocedure Evaluation (Signed)
Anesthesia Post Note  Patient: Patricia Chandler  Procedure(s) Performed: RIGHT BREAST CHEST WALL RECURRENCE EXCISION (Right ) INSERTION PORT-A-CATH WITH ULTRASOUND (N/A )     Patient location during evaluation: PACU Anesthesia Type: General Level of consciousness: awake and alert and oriented Pain management: pain level controlled Vital Signs Assessment: post-procedure vital signs reviewed and stable Respiratory status: spontaneous breathing, nonlabored ventilation and respiratory function stable Cardiovascular status: blood pressure returned to baseline Postop Assessment: no apparent nausea or vomiting Anesthetic complications: no    Last Vitals:  Vitals:   11/30/18 1005 11/30/18 1020  BP: 119/80 124/85  Pulse: 94 86  Resp: (!) 22 11  Temp: 36.8 C   SpO2: 100% 100%    Last Pain:  Vitals:   11/30/18 1005  TempSrc:   PainSc: 0-No pain                 Brennan Bailey

## 2018-12-01 ENCOUNTER — Encounter (HOSPITAL_COMMUNITY): Payer: Self-pay | Admitting: General Surgery

## 2018-12-01 ENCOUNTER — Ambulatory Visit: Payer: Medicare Other | Admitting: Psychology

## 2018-12-02 ENCOUNTER — Ambulatory Visit (INDEPENDENT_AMBULATORY_CARE_PROVIDER_SITE_OTHER): Payer: Medicare Other | Admitting: Psychology

## 2018-12-02 DIAGNOSIS — C50912 Malignant neoplasm of unspecified site of left female breast: Secondary | ICD-10-CM | POA: Diagnosis not present

## 2018-12-02 DIAGNOSIS — K449 Diaphragmatic hernia without obstruction or gangrene: Secondary | ICD-10-CM | POA: Diagnosis not present

## 2018-12-02 DIAGNOSIS — F064 Anxiety disorder due to known physiological condition: Secondary | ICD-10-CM | POA: Diagnosis not present

## 2018-12-02 DIAGNOSIS — K769 Liver disease, unspecified: Secondary | ICD-10-CM | POA: Diagnosis not present

## 2018-12-02 DIAGNOSIS — R918 Other nonspecific abnormal finding of lung field: Secondary | ICD-10-CM | POA: Diagnosis not present

## 2018-12-02 DIAGNOSIS — C50919 Malignant neoplasm of unspecified site of unspecified female breast: Secondary | ICD-10-CM | POA: Diagnosis not present

## 2018-12-02 DIAGNOSIS — C50911 Malignant neoplasm of unspecified site of right female breast: Secondary | ICD-10-CM | POA: Diagnosis not present

## 2018-12-02 NOTE — Assessment & Plan Note (Addendum)
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 Genetic counseling: Normal did not reveal any abnormal mutations 2. Neoadjuvant chemotherapy. [facility] 6 cycles followed by Herceptin And Perjetamaintenance completed 10/20/2016) from 12/31/15 to 04/15/16 3. Right mastectomy with reconstruction: 05/19/2016:Right mastectomy: IDC grade 3, 1.3 cm, low-grade DCIS, LVIDS present, margins negative, 0/1 lymph node negative, [id number] stage IA; repeat HER-2 negative ratio 1.41 4. Adjuvant antiestrogen therapy With letrozole 2.5 mg daily started March 2018 -------------------------------------------------------------------------------------------------------------------------------------------------- Breast cancer: Subcutaneous disease: 1.8 cm measured by ultrasound Biopsy revealed grade 2-3 IDC ER 90% PR 0% HER-2 negative Ki-67 40%  Oncotype score 52: Greater than 39% risk of distant recurrence with hormone therapy alone, chemo benefit greater than 15%  Recommendation: 1.  Surgery to remove the tumor 11/30/2018 2.  Adjuvant chemotherapy with dose dense Adriamycin and Cytoxan x4 followed by Taxol weekly x12 3.  Adjuvant radiation 4.  Follow-up adjuvant antiestrogen therapy ------------------------------------------------------------------------------------------------------------------------------------ Surgical pathology review: Right lumpectomy [name]): IDC, grade 3, 2.1cm, perineural invasion present, carcinoma present focally at the posterior margin and involving skeletal muscle.    Return to clinic in 2  weeks to start chemotherapy

## 2018-12-05 ENCOUNTER — Encounter (HOSPITAL_COMMUNITY): Payer: Self-pay | Admitting: General Surgery

## 2018-12-05 DIAGNOSIS — C50919 Malignant neoplasm of unspecified site of unspecified female breast: Secondary | ICD-10-CM | POA: Diagnosis not present

## 2018-12-05 NOTE — OR Nursing (Signed)
Added implant lot # and expiration date.

## 2018-12-06 ENCOUNTER — Ambulatory Visit (INDEPENDENT_AMBULATORY_CARE_PROVIDER_SITE_OTHER): Payer: Medicare Other | Admitting: Psychology

## 2018-12-06 ENCOUNTER — Encounter: Payer: Self-pay | Admitting: *Deleted

## 2018-12-06 DIAGNOSIS — F064 Anxiety disorder due to known physiological condition: Secondary | ICD-10-CM | POA: Diagnosis not present

## 2018-12-07 DIAGNOSIS — C50911 Malignant neoplasm of unspecified site of right female breast: Secondary | ICD-10-CM | POA: Diagnosis not present

## 2018-12-07 DIAGNOSIS — H524 Presbyopia: Secondary | ICD-10-CM | POA: Diagnosis not present

## 2018-12-08 ENCOUNTER — Encounter: Payer: Self-pay | Admitting: *Deleted

## 2018-12-08 NOTE — Progress Notes (Signed)
Patient Care Team: [name], MD as PCP - General (Family Medicine)  This is a [employer] virtual visit.  Patient's identifiers were checked and she is agreeable to proceed.  DIAGNOSIS:    ICD-10-CM   1. 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 female breast, unspecified estrogen receptor status ([place])  C50.211     SUMMARY OF ONCOLOGIC HISTORY: Oncology History  Breast cancer of upper-inner quadrant of right female breast ([place])  12/05/2015 Mammogram   Right breast mass 2:00 4 cm from nipple: 1.7 cm, T1c N0 stage IA; right breast 2:00 6 cm from nipple 1.3 cm mass, 7 mm satellite nodule between the 2 masses, T1 cN0 stage IA   12/06/2015 Initial Diagnosis   Right breast biopsy 2:00 6 cm from nipple: Grade 2-3 invasive ductal cancer, ER 90%, PR 2%, Ki-67 80%, HER-2 positive ratio 2.07; right biopsy 2:00 4 cm from nipple: IDC with DCIS, ER 95%, PR 10%, Ki-67 90%, HER-2 negative ratio 1.27   12/18/2015 Breast MRI   3 enhancing masses in the upper inner quadrant right breast spanning an area 3.7 cm (1.7 cm, 1 cm, 1.4 cm) no lymph node enlargement   12/31/2015 - 04/15/2016 Neo-Adjuvant Chemotherapy   [facility] Perjeta 6 cycles followed by Herceptin, Perjeta maintenance for 1 year completed 10/20/2016   01/08/2016 - 01/12/2016 Hospital Admission   Neutropenic fever hospitalization (because patient did not receive Neulasta with cycle 1)   01/10/2016 Miscellaneous   Genetic testing was normal did not reveal any mutations   04/16/2016 Breast MRI   Near CR to therapy. Previous 2 biopsied massses are no longer seen. Third satellite lesion is smaller from 10 mm to 5.3 mm, no abnormal LN.   05/19/2016 Surgery   Right mastectomy: IDC grade 3, 1.3 cm, low-grade DCIS, LVIDS present, margins negative, 0/1 lymph node negative, ypT1CypN0 stage IA; repeat HER-2 negative ratio 1.41   05/19/2016 Surgery   Right breast reconstruction with tissue expander. Acellular dermis for breast reconstruction  (Dr.Thimappa)    06/22/2016 -  Anti-estrogen oral therapy   Letrozole 2.5 mg daily   11/01/2018 Relapse/Recurrence   Patient palpated lump in the UIQ at surgical scar of reconstructed right breast. US of the right breast showed a 1.8cm mass suspicious for recurrent breast cancer. Biopsy showed grade 2 invasive ductal carcinoma, HER-2 negative (1+), ER 90%, PR negative, Ki67 40%.   11/11/2018 Oncotype testing   Oncotype score 52: Greater than 39% risk of distant recurrence with hormone therapy alone, chemo benefit greater than 15%   11/30/2018 Surgery   Right lumpectomy [name]): IDC, grade 3, 2.1cm, perineural invasion present, carcinoma present focally at the posterior margin and involving skeletal muscle.     CHIEF COMPLIANT: Follow-up s/p lumpectomy to review pathology  INTERVAL HISTORY: Patricia Chandler is a 63 y.o. with above-mentioned history of recurrent right breast cancer. She underwent a lumpectomy with Dr. [name] on 11/30/18 for which pathology confirmed invasive ductal carcinoma, grade 3, 2.1cm, perineural invasion present, carcinoma present focally at the posterior margin and involving skeletal muscle. Echo on 11/29/18 showed an ejection fraction in the range of 60-65%. She presents to the clinic today to review her pathology report and discuss further treatment.   REVIEW OF SYSTEMS:   Constitutional: Denies fevers, chills or abnormal weight loss Eyes: Denies blurriness of vision Ears, nose, mouth, throat, and face: Denies mucositis or sore throat Respiratory: Denies cough, dyspnea or wheezes Cardiovascular: Denies palpitation, chest discomfort Gastrointestinal: Denies nausea, heartburn or change in bowel  habits Skin: Denies abnormal skin rashes Lymphatics: Denies new lymphadenopathy or easy bruising Neurological: Denies numbness, tingling or new weaknesses Behavioral/Psych: Mood is stable, no new changes  Extremities: No lower extremity edema Breast: denies any  pain or lumps or nodules in either breasts All other systems were reviewed with the patient and are negative.  I have reviewed the past medical history, past surgical history, social history and family history with the patient and they are unchanged from previous note.  ALLERGIES:  has No Known Allergies.  MEDICATIONS:  Current Outpatient Medications  Medication Sig Dispense Refill  . calcium-vitamin D (OSCAL WITH D) 500-200 MG-UNIT tablet Take 1 tablet by mouth daily with breakfast.    . carvedilol (COREG) 3.125 MG tablet Take 1 tablet (3.125 mg total) by mouth 2 (two) times daily. 180 tablet 3  . denosumab (PROLIA) 60 MG/ML SOLN injection Inject 60 mg into the skin every 6 (six) months. Administer in upper arm, thigh, or abdomen    . Lactobacillus (PROBIOTIC ACIDOPHILUS PO) Take 1 capsule by mouth daily.     Marland Kitchen letrozole (FEMARA) 2.5 MG tablet Take 1 tablet (2.5 mg total) by mouth daily. 90 tablet 3  . losartan (COZAAR) 25 MG tablet Take 1 tablet (25 mg total) by mouth at bedtime. 90 tablet 3  . omeprazole (PRILOSEC) 20 MG capsule Take 20 mg by mouth daily as needed (heartburn).    Marland Kitchen oxyCODONE (OXY IR/ROXICODONE) 5 MG immediate release tablet Take 1 tablet (5 mg total) by mouth every 4 (four) hours as needed. 10 tablet 0  . Turmeric POWD Take 1 Scoop by mouth daily.    Marland Kitchen zolpidem (AMBIEN) 5 MG tablet Take 1 tablet (5 mg total) by mouth at bedtime as needed. for sleep (Patient taking differently: Take 2.5 mg by mouth at bedtime as needed for sleep. ) 30 tablet 2   No current facility-administered medications for this visit.     PHYSICAL EXAMINATION: ECOG PERFORMANCE STATUS: 1 - Symptomatic but completely ambulatory  There were no vitals filed for this visit. There were no vitals filed for this visit.  GENERAL: alert, no distress and comfortable SKIN: skin color, texture, turgor are normal, no rashes or significant lesions EYES: normal, Conjunctiva are pink and non-injected, sclera  clear OROPHARYNX: no exudate, no erythema and lips, buccal mucosa, and tongue normal  NECK: supple, thyroid normal size, non-tender, without nodularity LYMPH: no palpable lymphadenopathy in the cervical, axillary or inguinal LUNGS: clear to auscultation and percussion with normal breathing effort HEART: regular rate & rhythm and no murmurs and no lower extremity edema ABDOMEN: abdomen soft, non-tender and normal bowel sounds MUSCULOSKELETAL: no cyanosis of digits and no clubbing  NEURO: alert & oriented x 3 with fluent speech, no focal motor/sensory deficits EXTREMITIES: No lower extremity edema  LABORATORY DATA:  I have reviewed the data as listed CMP Latest Ref Rng & Units 11/18/2018 06/16/2018 06/15/2018  Glucose 70 - 99 mg/dL 91 90 99  BUN 8 - 23 mg/dL '19 10 8  ' Creatinine 0.44 - 1.00 mg/dL 0.92 0.67 0.74  Sodium 135 - 145 mmol/L 138 138 138  Potassium 3.5 - 5.1 mmol/L 3.9 3.9 4.6  Chloride 98 - 111 mmol/L 106 113(H) 111  CO2 22 - 32 mmol/L 21(L) 18(L) 22  Calcium 8.9 - 10.3 mg/dL 9.4 7.9(L) 7.8(L)  Total Protein 6.5 - 8.1 g/dL - - 6.1(L)  Total Bilirubin 0.3 - 1.2 mg/dL - - 0.8  Alkaline Phos 38 - 126 U/L - - 53  AST 15 - 41 U/L - - 16  ALT 0 - 44 U/L - - 15    Lab Results  Component Value Date   WBC 9.6 11/18/2018   HGB 14.6 11/18/2018   HCT 44.6 11/18/2018   MCV 91.4 11/18/2018   PLT 261 11/18/2018   NEUTROABS 6.5 06/14/2018    ASSESSMENT & PLAN:  Breast cancer of upper-inner quadrant of right female breast ([place]) Right breast biopsy 2:00 6 cm from nipple: Grade 2-3 invasive ductal cancer, ER 90%, PR 2%, Ki-67 80%, HER-2 positive ratio 2.07; right biopsy 2:00 4 cm from nipple: IDC with DCIS, ER 95%, PR 10%, Ki-67 90%, HER-2 negative ratio 1.27 Breast MRI08/30/2017: 3 enhancing masses in the upper inner quadrant right breast spanning an area 3.7 cm (1.7 cm, 1 cm, 1.4 cm) no lymph node enlargement Clinical stage: T2N0 (stage 2A)  Treatment plan: 1. Genetic counseling:  Normal did not reveal any abnormal mutations 2. Neoadjuvant chemotherapy. [facility] Perjeta 6 cycles followed by Herceptin And Perjetamaintenance completed 10/20/2016) from 12/31/15 to 04/15/16 3. Right mastectomy with reconstruction: 05/19/2016:Right mastectomy: IDC grade 3, 1.3 cm, low-grade DCIS, LVIDS present, margins negative, 0/1 lymph node negative, ypT1CypN0 stage IA; repeat HER-2 negative ratio 1.41 4. Adjuvant antiestrogen therapy With letrozole 2.5 mg daily started March 2018 -------------------------------------------------------------------------------------------------------------------------------------------------- Breast cancer: Subcutaneous disease: 1.8 cm measured by ultrasound Biopsy revealed grade 2-3 IDC ER 90% PR 0% HER-2 negative Ki-67 40%  Oncotype score 52: Greater than 39% risk of distant recurrence with hormone therapy alone, chemo benefit greater than 15%  Recommendation: 1.  Surgery to remove the tumor 11/30/2018 2.  Adjuvant chemotherapy with Taxotere and Cytoxan x6 cycles 3.  Adjuvant radiation 4.  Follow-up adjuvant antiestrogen therapy ------------------------------------------------------------------------------------------------------------------------------------ Surgical pathology review: Right lumpectomy [name]): IDC, grade 3, 2.1cm, perineural invasion present, carcinoma present focally at the posterior margin and involving skeletal muscle.   We discussed the difference between Taxotere and Cytoxan versus Adriamycin Cytoxan-Taxol.  Data shows that 6 cycles of TC is equivalent to [facility] followed by T. We discussed the toxicities of Taxotere including the risk of neuropathy. Bone marrow suppression and cytopenias, risk of infection liver and renal toxicities as well as long-term bone marrow toxicities were discussed in detail. Patient is agreeable with treatment  Return to clinic in 2 weeks to start chemotherapy  No orders of the defined types were placed in  this encounter.  The patient has a good understanding of the overall plan. she agrees with it. she will call with any problems that may develop before the next visit here.  Nicholas Lose, MD 12/09/2018  Julious Oka Dorshimer am acting as scribe for Dr. Nicholas Lose.  I have reviewed the above documentation for accuracy and completeness, and I agree with the above.

## 2018-12-09 ENCOUNTER — Encounter: Payer: Self-pay | Admitting: Hematology and Oncology

## 2018-12-09 ENCOUNTER — Encounter: Payer: Self-pay | Admitting: *Deleted

## 2018-12-09 ENCOUNTER — Other Ambulatory Visit: Payer: Self-pay | Admitting: Hematology and Oncology

## 2018-12-09 ENCOUNTER — Inpatient Hospital Stay: Payer: Medicare Other | Attending: Hematology and Oncology | Admitting: Hematology and Oncology

## 2018-12-09 DIAGNOSIS — C50211 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 female breast: Secondary | ICD-10-CM | POA: Diagnosis not present

## 2018-12-09 DIAGNOSIS — Z170 Estrogen receptor positive status [ER+]: Secondary | ICD-10-CM

## 2018-12-09 MED ORDER — DEXAMETHASONE 4 MG PO TABS: 4.0000 mg | ORAL_TABLET | Freq: Two times a day (BID) | ORAL | 0 refills | Status: DC

## 2018-12-09 MED ORDER — LORAZEPAM 0.5 MG PO TABS: 0.5000 mg | ORAL_TABLET | Freq: Every evening | ORAL | 0 refills | Status: DC | PRN

## 2018-12-09 MED ORDER — LIDOCAINE-PRILOCAINE 2.5-2.5 % EX CREA: TOPICAL_CREAM | CUTANEOUS | 3 refills | Status: DC

## 2018-12-09 MED ORDER — ONDANSETRON HCL 8 MG PO TABS: 8.0000 mg | ORAL_TABLET | Freq: Two times a day (BID) | ORAL | 1 refills | Status: DC | PRN

## 2018-12-09 MED ORDER — PROCHLORPERAZINE MALEATE 10 MG PO TABS: 10.0000 mg | ORAL_TABLET | Freq: Four times a day (QID) | ORAL | 1 refills | Status: DC | PRN

## 2018-12-09 NOTE — Addendum Note (Signed)
Addended by: Nicholas Lose on: 12/09/2018 01:17 PM   Modules accepted: Orders

## 2018-12-10 ENCOUNTER — Encounter: Payer: Self-pay | Admitting: *Deleted

## 2018-12-12 ENCOUNTER — Telehealth: Payer: Self-pay | Admitting: Hematology and Oncology

## 2018-12-12 ENCOUNTER — Encounter: Payer: Self-pay | Admitting: Family Medicine

## 2018-12-12 ENCOUNTER — Ambulatory Visit (INDEPENDENT_AMBULATORY_CARE_PROVIDER_SITE_OTHER): Payer: Medicare Other | Admitting: Psychology

## 2018-12-12 DIAGNOSIS — F064 Anxiety disorder due to known physiological condition: Secondary | ICD-10-CM

## 2018-12-12 NOTE — Telephone Encounter (Signed)
I take with patient regarding schedule

## 2018-12-13 ENCOUNTER — Encounter: Payer: Self-pay | Admitting: *Deleted

## 2018-12-13 ENCOUNTER — Telehealth: Payer: Self-pay | Admitting: Hematology and Oncology

## 2018-12-13 NOTE — Telephone Encounter (Signed)
I talk with patient about removing MD on 9/4 because he is on pal

## 2018-12-16 ENCOUNTER — Other Ambulatory Visit: Payer: Self-pay | Admitting: *Deleted

## 2018-12-20 ENCOUNTER — Inpatient Hospital Stay: Payer: Medicare Other | Attending: Hematology and Oncology

## 2018-12-20 ENCOUNTER — Telehealth: Payer: Self-pay | Admitting: *Deleted

## 2018-12-20 ENCOUNTER — Other Ambulatory Visit: Payer: Self-pay

## 2018-12-20 DIAGNOSIS — C50211 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 female breast: Secondary | ICD-10-CM | POA: Insufficient documentation

## 2018-12-20 DIAGNOSIS — Z5111 Encounter for antineoplastic chemotherapy: Secondary | ICD-10-CM | POA: Insufficient documentation

## 2018-12-20 DIAGNOSIS — Z170 Estrogen receptor positive status [ER+]: Secondary | ICD-10-CM | POA: Insufficient documentation

## 2018-12-20 NOTE — Telephone Encounter (Signed)
-----   Message from Tora Kindred, [facility] sent at 12/20/2018  1:45 PM EDT ----- Regarding: RE: neulasta/on-Pro She can get Onpro.  I can change the order now. Ace Bergfeld, can you take the inj appts out for Day 2 each cycle please? GT ----- Message ----- From: Tora Kindred, [facility] Sent: 12/20/2018   1:44 PM EDT To: Jesse Fall, RN, Delleon Meade Maw, # Subject: RE: neulasta/on-Pro                            Darlena called me about this.  I changed her to Neulasta. GT ----- Message ----- From: Altamese Dilling D Sent: 12/20/2018  12:29 PM EDT To: Jesse Fall, RN, [facility] Subject: FW: neulasta/on-Pro                            Hi all,  Sharing this. I will submit for Onpro  Pt as [employer]  Thanks Karena Addison ----- Message ----- From: Jesse Fall, RN Sent: 12/20/2018  11:52 AM EDT To: Daisey Must, [facility] Subject: neulasta/on-Pro                                Pt would like to get On-Pro.  She had in past.  Is it possible for her to get this time?  Will insurance cover?  Thanks, [employer]

## 2018-12-20 NOTE — Telephone Encounter (Signed)
Informed pt that she could get On-Pro per [facility].

## 2018-12-22 ENCOUNTER — Ambulatory Visit (INDEPENDENT_AMBULATORY_CARE_PROVIDER_SITE_OTHER): Payer: Medicare Other | Admitting: Psychology

## 2018-12-22 ENCOUNTER — Encounter: Payer: Self-pay | Admitting: Hematology and Oncology

## 2018-12-22 DIAGNOSIS — F064 Anxiety disorder due to known physiological condition: Secondary | ICD-10-CM

## 2018-12-22 NOTE — Progress Notes (Signed)
Called pt to introduce myself as her Arboriculturist.  Unfortunately there aren't any foundations offering copay assistance for her Dx and the type of ins she has.  I left a message informing her of the [place] and requested she return my call if she would like to apply.

## 2018-12-23 ENCOUNTER — Inpatient Hospital Stay: Payer: Medicare Other

## 2018-12-23 ENCOUNTER — Encounter: Payer: Self-pay | Admitting: *Deleted

## 2018-12-23 ENCOUNTER — Ambulatory Visit: Payer: Medicare Other | Admitting: Hematology and Oncology

## 2018-12-23 ENCOUNTER — Other Ambulatory Visit: Payer: Self-pay

## 2018-12-23 VITALS — BP 119/77 | HR 78 | Temp 98.5°F | Resp 17 | Wt 155.8 lb

## 2018-12-23 DIAGNOSIS — C50211 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 female breast: Secondary | ICD-10-CM | POA: Diagnosis not present

## 2018-12-23 DIAGNOSIS — Z95828 Presence of other vascular implants and grafts: Secondary | ICD-10-CM

## 2018-12-23 DIAGNOSIS — Z5111 Encounter for antineoplastic chemotherapy: Secondary | ICD-10-CM | POA: Diagnosis not present

## 2018-12-23 DIAGNOSIS — Z170 Estrogen receptor positive status [ER+]: Secondary | ICD-10-CM

## 2018-12-23 LAB — CMP (CANCER CENTER ONLY)
ALT: 15 U/L (ref 0–44)
AST: 17 U/L (ref 15–41)
Albumin: 4.1 g/dL (ref 3.5–5.0)
Alkaline Phosphatase: 60 U/L (ref 38–126)
Anion gap: 10 (ref 5–15)
BUN: 24 mg/dL — ABNORMAL HIGH (ref 8–23)
CO2: 21 mmol/L — ABNORMAL LOW (ref 22–32)
Calcium: 9.2 mg/dL (ref 8.9–10.3)
Chloride: 107 mmol/L (ref 98–111)
Creatinine: 0.76 mg/dL (ref 0.44–1.00)
GFR, Est AFR Am: >60 mL/min (ref >60)
GFR, Estimated: >60 mL/min (ref >60)
Glucose, Bld: 84 mg/dL (ref 70–99)
Potassium: 3.9 mmol/L (ref 3.5–5.1)
Sodium: 138 mmol/L (ref 135–145)
Total Bilirubin: 0.3 mg/dL (ref 0.3–1.2)
Total Protein: 6.8 g/dL (ref 6.5–8.1)

## 2018-12-23 LAB — CBC WITH DIFFERENTIAL (CANCER CENTER ONLY)
Abs Immature Granulocytes: 0.1 10*3/uL — ABNORMAL HIGH (ref 0.00–0.07)
Basophils Absolute: 0 10*3/uL (ref 0.0–0.1)
Basophils Relative: 0 %
Eosinophils Absolute: 0 10*3/uL (ref 0.0–0.5)
Eosinophils Relative: 0 %
HCT: 37.5 % (ref 36.0–46.0)
Hemoglobin: 12.7 g/dL (ref 12.0–15.0)
Immature Granulocytes: 1 %
Lymphocytes Relative: 13 %
Lymphs Abs: 1.9 10*3/uL (ref 0.7–4.0)
MCH: 29.6 pg (ref 26.0–34.0)
MCHC: 33.9 g/dL (ref 30.0–36.0)
MCV: 87.4 fL (ref 80.0–100.0)
Monocytes Absolute: 1.1 10*3/uL — ABNORMAL HIGH (ref 0.1–1.0)
Monocytes Relative: 8 %
Neutro Abs: 11 10*3/uL — ABNORMAL HIGH (ref 1.7–7.7)
Neutrophils Relative %: 78 %
Platelet Count: 295 10*3/uL (ref 150–400)
RBC: 4.29 MIL/uL (ref 3.87–5.11)
RDW: 13 % (ref 11.5–15.5)
WBC Count: 14.1 10*3/uL — ABNORMAL HIGH (ref 4.0–10.5)
nRBC: 0 % (ref 0.0–0.2)

## 2018-12-23 MED ORDER — PEGFILGRASTIM 6 MG/0.6ML ~~LOC~~ PSKT
6.0000 mg | PREFILLED_SYRINGE | Freq: Once | SUBCUTANEOUS | Status: AC
  Administered 2018-12-23: 6 mg via SUBCUTANEOUS

## 2018-12-23 MED ORDER — PALONOSETRON HCL INJECTION 0.25 MG/5ML
INTRAVENOUS | Status: AC
  Filled 2018-12-23: qty 5

## 2018-12-23 MED ORDER — DEXAMETHASONE SODIUM PHOSPHATE 10 MG/ML IJ SOLN
INTRAMUSCULAR | Status: AC
  Filled 2018-12-23: qty 1

## 2018-12-23 MED ORDER — SODIUM CHLORIDE 0.9% FLUSH
10.0000 mL | INTRAVENOUS | Status: DC | PRN
  Administered 2018-12-23: 10 mL
  Filled 2018-12-23: qty 10

## 2018-12-23 MED ORDER — SODIUM CHLORIDE 0.9 % IV SOLN
600.0000 mg/m2 | Freq: Once | INTRAVENOUS | Status: AC
  Administered 2018-12-23: 1060 mg via INTRAVENOUS
  Filled 2018-12-23: qty 53

## 2018-12-23 MED ORDER — HEPARIN SOD (PORK) LOCK FLUSH 100 UNIT/ML IV SOLN
500.0000 [IU] | Freq: Once | INTRAVENOUS | Status: AC | PRN
  Administered 2018-12-23: 500 [IU]
  Filled 2018-12-23: qty 5

## 2018-12-23 MED ORDER — DEXAMETHASONE SODIUM PHOSPHATE 10 MG/ML IJ SOLN
10.0000 mg | Freq: Once | INTRAMUSCULAR | Status: AC
  Administered 2018-12-23: 10 mg via INTRAVENOUS

## 2018-12-23 MED ORDER — SODIUM CHLORIDE 0.9 % IV SOLN
75.0000 mg/m2 | Freq: Once | INTRAVENOUS | Status: AC
  Administered 2018-12-23: 130 mg via INTRAVENOUS
  Filled 2018-12-23: qty 13

## 2018-12-23 MED ORDER — SODIUM CHLORIDE 0.9 % IV SOLN
Freq: Once | INTRAVENOUS | Status: AC
  Administered 2018-12-23: 10:00:00 via INTRAVENOUS
  Filled 2018-12-23: qty 250

## 2018-12-23 MED ORDER — PALONOSETRON HCL INJECTION 0.25 MG/5ML
0.2500 mg | Freq: Once | INTRAVENOUS | Status: AC
  Administered 2018-12-23: 0.25 mg via INTRAVENOUS

## 2018-12-23 MED ORDER — SODIUM CHLORIDE 0.9 % IV SOLN
Freq: Once | INTRAVENOUS | Status: AC
  Administered 2018-12-23: 10:00:00 via INTRAVENOUS
  Filled 2018-12-23: qty 5

## 2018-12-23 MED ORDER — SODIUM CHLORIDE 0.9% FLUSH
10.0000 mL | INTRAVENOUS | Status: DC | PRN
  Administered 2018-12-23: 10 mL via INTRAVENOUS
  Filled 2018-12-23: qty 10

## 2018-12-23 MED ORDER — PEGFILGRASTIM 6 MG/0.6ML ~~LOC~~ PSKT
PREFILLED_SYRINGE | SUBCUTANEOUS | Status: AC
  Filled 2018-12-23: qty 0.6

## 2018-12-23 NOTE — Patient Instructions (Signed)
[facility] Discharge Instructions for Patients Receiving Chemotherapy  Today you received the following chemotherapy agents: Taxotere, Cytoxan  To help prevent nausea and vomiting after your treatment, we encourage you to take your nausea medication as directed.   If you develop nausea and vomiting that is not controlled by your nausea medication, call the clinic.   BELOW ARE SYMPTOMS THAT SHOULD BE REPORTED IMMEDIATELY:  *FEVER GREATER THAN 100.5 F  *CHILLS WITH OR WITHOUT FEVER  NAUSEA AND VOMITING THAT IS NOT CONTROLLED WITH YOUR NAUSEA MEDICATION  *UNUSUAL SHORTNESS OF BREATH  *UNUSUAL BRUISING OR BLEEDING  TENDERNESS IN MOUTH AND THROAT WITH OR WITHOUT PRESENCE OF ULCERS  *URINARY PROBLEMS  *BOWEL PROBLEMS  UNUSUAL RASH Items with * indicate a potential emergency and should be followed up as soon as possible.  Feel free to call the clinic should you have any questions or concerns. The clinic phone number is [phone].  Please show the [facility] at check-in to the Emergency Department and triage nurse.  Docetaxel injection What is this medicine? DOCETAXEL (doe se TAX el) is a chemotherapy drug. It targets fast dividing cells, like cancer cells, and causes these cells to die. This medicine is used to treat many types of cancers like breast cancer, certain stomach cancers, head and neck cancer, lung cancer, and prostate cancer. This medicine may be used for other purposes; ask your health care provider or pharmacist if you have questions. COMMON BRAND NAME(S): Docefrez, Taxotere What should I tell my health care provider before I take this medicine? They need to know if you have any of these conditions:  infection (especially a virus infection such as chickenpox, cold sores, or herpes)  liver disease  low blood counts, like low white cell, platelet, or red cell counts  an unusual or allergic reaction to docetaxel, polysorbate 80, other  chemotherapy agents, other medicines, foods, dyes, or preservatives  pregnant or trying to get pregnant  breast-feeding How should I use this medicine? This drug is given as an infusion into a vein. It is administered in a hospital or clinic by a specially trained health care professional. Talk to your pediatrician regarding the use of this medicine in children. Special care may be needed. Overdosage: If you think you have taken too much of this medicine contact a poison control center or emergency room at once. NOTE: This medicine is only for you. Do not share this medicine with others. What if I miss a dose? It is important not to miss your dose. Call your doctor or health care professional if you are unable to keep an appointment. What may interact with this medicine?  aprepitant  certain antibiotics like erythromycin or clarithromycin  certain antivirals for HIV or hepatitis  certain medicines for fungal infections like fluconazole, itraconazole, ketoconazole, posaconazole, or voriconazole  cimetidine  ciprofloxacin  conivaptan  cyclosporine  dronedarone  fluvoxamine  grapefruit juice  imatinib  verapamil This list may not describe all possible interactions. Give your health care provider a list of all the medicines, herbs, non-prescription drugs, or dietary supplements you use. Also tell them if you smoke, drink alcohol, or use illegal drugs. Some items may interact with your medicine. What should I watch for while using this medicine? Your condition will be monitored carefully while you are receiving this medicine. You will need important blood work done while you are taking this medicine. Call your doctor or health care professional for advice if you get a fever, chills  or sore throat, or other symptoms of a cold or flu. Do not treat yourself. This drug decreases your body's ability to fight infections. Try to avoid being around people who are sick. Some products may  contain alcohol. Ask your health care professional if this medicine contains alcohol. Be sure to tell all health care professionals you are taking this medicine. Certain medicines, like metronidazole and disulfiram, can cause an unpleasant reaction when taken with alcohol. The reaction includes flushing, headache, nausea, vomiting, sweating, and increased thirst. The reaction can last from 30 minutes to several hours. You may get drowsy or dizzy. Do not drive, use machinery, or do anything that needs mental alertness until you know how this medicine affects you. Do not stand or sit up quickly, especially if you are an older patient. This reduces the risk of dizzy or fainting spells. Alcohol may interfere with the effect of this medicine. Talk to your health care professional about your risk of cancer. You may be more at risk for certain types of cancer if you take this medicine. Do not become pregnant while taking this medicine or for 6 months after stopping it. Women should inform their doctor if they wish to become pregnant or think they might be pregnant. There is a potential for serious side effects to an unborn child. Talk to your health care professional or pharmacist for more information. Do not breast-feed an infant while taking this medicine or for 1 to 2 weeks after stopping it. Males who get this medicine must use a condom during sex with females who can get pregnant. If you get a woman pregnant, the baby could have birth defects. The baby could die before they are born. You will need to continue wearing a condom for 3 months after stopping the medicine. Tell your health care provider right away if your partner becomes pregnant while you are taking this medicine. This may interfere with the ability to father a child. You should talk to your doctor or health care professional if you are concerned about your fertility. What side effects may I notice from receiving this medicine? Side effects that you  should report to your doctor or health care professional as soon as possible:  allergic reactions like skin rash, itching or hives, swelling of the face, lips, or tongue  blurred vision  breathing problems  changes in vision  low blood counts - This drug may decrease the number of white blood cells, red blood cells and platelets. You may be at increased risk for infections and bleeding.  nausea and vomiting  pain, redness or irritation at site where injected  pain, tingling, numbness in the hands or feet  redness, blistering, peeling, or loosening of the skin, including inside the mouth  signs of decreased platelets or bleeding - bruising, pinpoint red spots on the skin, black, tarry stools, nosebleeds  signs of decreased red blood cells - unusually weak or tired, fainting spells, lightheadedness  signs of infection - fever or chills, cough, sore throat, pain or difficulty passing urine  swelling of the ankle, feet, hands Side effects that usually do not require medical attention (report to your doctor or health care professional if they continue or are bothersome):  constipation  diarrhea  fingernail or toenail changes  hair loss  loss of appetite  mouth sores  muscle pain This list may not describe all possible side effects. Call your doctor for medical advice about side effects. You may report side effects to FDA at  1-800-FDA-1088. Where should I keep my medicine? This drug is given in a hospital or clinic and will not be stored at home. NOTE: This sheet is a summary. It may not cover all possible information. If you have questions about this medicine, talk to your doctor, pharmacist, or health care provider.  2020 [employer]/Gold Standard (2018-06-10 12:23:11)   Cyclophosphamide injection What is this medicine? CYCLOPHOSPHAMIDE (sye kloe FOSS fa mide) is a chemotherapy drug. It slows the growth of cancer cells. This medicine is used to treat many types of cancer  like lymphoma, myeloma, leukemia, breast cancer, and ovarian cancer, to name a few. This medicine may be used for other purposes; ask your health care provider or pharmacist if you have questions. COMMON BRAND NAME(S): Cytoxan, Neosar What should I tell my health care provider before I take this medicine? They need to know if you have any of these conditions:  blood disorders  history of other chemotherapy  infection  kidney disease  liver disease  recent or ongoing radiation therapy  tumors in the bone marrow  an unusual or allergic reaction to cyclophosphamide, other chemotherapy, other medicines, foods, dyes, or preservatives  pregnant or trying to get pregnant  breast-feeding How should I use this medicine? This drug is usually given as an injection into a vein or muscle or by infusion into a vein. It is administered in a hospital or clinic by a specially trained health care professional. Talk to your pediatrician regarding the use of this medicine in children. Special care may be needed. Overdosage: If you think you have taken too much of this medicine contact a poison control center or emergency room at once. NOTE: This medicine is only for you. Do not share this medicine with others. What if I miss a dose? It is important not to miss your dose. Call your doctor or health care professional if you are unable to keep an appointment. What may interact with this medicine? This medicine may interact with the following medications:  amiodarone  amphotericin B  azathioprine  certain antiviral medicines for HIV or AIDS such as protease inhibitors (e.g., indinavir, ritonavir) and zidovudine  certain blood pressure medications such as benazepril, captopril, enalapril, fosinopril, lisinopril, moexipril, monopril, perindopril, quinapril, ramipril, trandolapril  certain cancer medications such as anthracyclines (e.g., daunorubicin, doxorubicin), busulfan, cytarabine, paclitaxel,  pentostatin, tamoxifen, trastuzumab  certain diuretics such as chlorothiazide, chlorthalidone, hydrochlorothiazide, indapamide, metolazone  certain medicines that treat or prevent blood clots like warfarin  certain muscle relaxants such as succinylcholine  cyclosporine  etanercept  indomethacin  medicines to increase blood counts like filgrastim, pegfilgrastim, sargramostim  medicines used as general anesthesia  metronidazole  natalizumab This list may not describe all possible interactions. Give your health care provider a list of all the medicines, herbs, non-prescription drugs, or dietary supplements you use. Also tell them if you smoke, drink alcohol, or use illegal drugs. Some items may interact with your medicine. What should I watch for while using this medicine? Visit your doctor for checks on your progress. This drug may make you feel generally unwell. This is not uncommon, as chemotherapy can affect healthy cells as well as cancer cells. Report any side effects. Continue your course of treatment even though you feel ill unless your doctor tells you to stop. Drink water or other fluids as directed. Urinate often, even at night. In some cases, you may be given additional medicines to help with side effects. Follow all directions for their use. Call your doctor or  health care professional for advice if you get a fever, chills or sore throat, or other symptoms of a cold or flu. Do not treat yourself. This drug decreases your body's ability to fight infections. Try to avoid being around people who are sick. This medicine may increase your risk to bruise or bleed. Call your doctor or health care professional if you notice any unusual bleeding. Be careful brushing and flossing your teeth or using a toothpick because you may get an infection or bleed more easily. If you have any dental work done, tell your dentist you are receiving this medicine. You may get drowsy or dizzy. Do not  drive, use machinery, or do anything that needs mental alertness until you know how this medicine affects you. Do not become pregnant while taking this medicine or for 1 year after stopping it. Women should inform their doctor if they wish to become pregnant or think they might be pregnant. Men should not father a child while taking this medicine and for 4 months after stopping it. There is a potential for serious side effects to an unborn child. Talk to your health care professional or pharmacist for more information. Do not breast-feed an infant while taking this medicine. This medicine may interfere with the ability to have a child. This medicine has caused ovarian failure in some women. This medicine has caused reduced sperm counts in some men. You should talk with your doctor or health care professional if you are concerned about your fertility. If you are going to have surgery, tell your doctor or health care professional that you have taken this medicine. What side effects may I notice from receiving this medicine? Side effects that you should report to your doctor or health care professional as soon as possible:  allergic reactions like skin rash, itching or hives, swelling of the face, lips, or tongue  low blood counts - this medicine may decrease the number of white blood cells, red blood cells and platelets. You may be at increased risk for infections and bleeding.  signs of infection - fever or chills, cough, sore throat, pain or difficulty passing urine  signs of decreased platelets or bleeding - bruising, pinpoint red spots on the skin, black, tarry stools, blood in the urine  signs of decreased red blood cells - unusually weak or tired, fainting spells, lightheadedness  breathing problems  dark urine  dizziness  palpitations  swelling of the ankles, feet, hands  trouble passing urine or change in the amount of urine  weight gain  yellowing of the eyes or skin Side  effects that usually do not require medical attention (report to your doctor or health care professional if they continue or are bothersome):  changes in nail or skin color  hair loss  missed menstrual periods  mouth sores  nausea, vomiting This list may not describe all possible side effects. Call your doctor for medical advice about side effects. You may report side effects to FDA at 1-800-FDA-1088. Where should I keep my medicine? This drug is given in a hospital or clinic and will not be stored at home. NOTE: This sheet is a summary. It may not cover all possible information. If you have questions about this medicine, talk to your doctor, pharmacist, or health care provider.  2020 [employer]/Gold Standard (2012-02-19 16:22:58)

## 2018-12-24 ENCOUNTER — Ambulatory Visit: Payer: Medicare Other

## 2018-12-29 NOTE — Progress Notes (Signed)
Patient Care Team: [name], MD as PCP - General (Family Medicine)  DIAGNOSIS:    ICD-10-CM   1. 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 breast in female, estrogen receptor positive ([place])  C50.211    Z17.0     SUMMARY OF ONCOLOGIC HISTORY: Oncology History  Breast cancer of upper-inner quadrant of right female breast ([place])  12/05/2015 Mammogram   Right breast mass 2:00 4 cm from nipple: 1.7 cm, T1c N0 stage IA; right breast 2:00 6 cm from nipple 1.3 cm mass, 7 mm satellite nodule between the 2 masses, T1 cN0 stage IA   12/06/2015 Initial Diagnosis   Right breast biopsy 2:00 6 cm from nipple: Grade 2-3 invasive ductal cancer, ER 90%, PR 2%, Ki-67 80%, HER-2 positive ratio 2.07; right biopsy 2:00 4 cm from nipple: IDC with DCIS, ER 95%, PR 10%, Ki-67 90%, HER-2 negative ratio 1.27   12/18/2015 Breast MRI   3 enhancing masses in the upper inner quadrant right breast spanning an area 3.7 cm (1.7 cm, 1 cm, 1.4 cm) no lymph node enlargement   12/31/2015 - 04/15/2016 Neo-Adjuvant Chemotherapy   [facility] Perjeta 6 cycles followed by Herceptin, Perjeta maintenance for 1 year completed 10/20/2016   01/08/2016 - 01/12/2016 Hospital Admission   Neutropenic fever hospitalization (because patient did not receive Neulasta with cycle 1)   01/10/2016 Miscellaneous   Genetic testing was normal did not reveal any mutations   04/16/2016 Breast MRI   Near CR to therapy. Previous 2 biopsied massses are no longer seen. Third satellite lesion is smaller from 10 mm to 5.3 mm, no abnormal LN.   05/19/2016 Surgery   Right mastectomy: IDC grade 3, 1.3 cm, low-grade DCIS, LVIDS present, margins negative, 0/1 lymph node negative, ypT1CypN0 stage IA; repeat HER-2 negative ratio 1.41   05/19/2016 Surgery   Right breast reconstruction with tissue expander. Acellular dermis for breast reconstruction (Dr.Thimappa)    06/22/2016 -  Anti-estrogen oral therapy   Letrozole 2.5 mg daily   11/01/2018  Relapse/Recurrence   Patient palpated lump in the UIQ at surgical scar of reconstructed right breast. US of the right breast showed a 1.8cm mass suspicious for recurrent breast cancer. Biopsy showed grade 2 invasive ductal carcinoma, HER-2 negative (1+), ER 90%, PR negative, Ki67 40%.   11/11/2018 Oncotype testing   Oncotype score 52: Greater than 39% risk of distant recurrence with hormone therapy alone, chemo benefit greater than 15%   11/30/2018 Surgery   Right lumpectomy [name]): IDC, grade 3, 2.1cm, perineural invasion present, carcinoma present focally at the posterior margin and involving skeletal muscle.   11/30/2018 Cancer Staging   Staging form: Breast, AJCC 8th Edition - Pathologic stage from 11/30/2018: Stage IIA (pT2, pN0, cM0, G3, ER+, PR-, HER2-) - Signed by Gardenia Phlegm, NP on 12/14/2018   12/23/2018 -  Chemotherapy   The patient had palonosetron (ALOXI) injection 0.25 mg, 0.25 mg, Intravenous,  Once, 1 of 6 cycles Administration: 0.25 mg (12/23/2018) pegfilgrastim (NEULASTA ONPRO KIT) injection 6 mg, 6 mg, Subcutaneous, Once, 1 of 6 cycles Administration: 6 mg (12/23/2018) cyclophosphamide (CYTOXAN) 1,060 mg in sodium chloride 0.9 % 250 mL chemo infusion, 600 mg/m2 = 1,060 mg, Intravenous,  Once, 1 of 6 cycles Administration: 1,060 mg (12/23/2018) DOCEtaxel (TAXOTERE) 130 mg in sodium chloride 0.9 % 250 mL chemo infusion, 75 mg/m2 = 130 mg, Intravenous,  Once, 1 of 6 cycles Administration: 130 mg (12/23/2018) fosaprepitant (EMEND) 150 mg in sodium chloride 0.9 % 145 mL IVPB, ,  Intravenous,  Once, 1 of 6 cycles Administration:  (12/23/2018)  for chemotherapy treatment.      CHIEF COMPLIANT: Cycle 1 Day 8 Taxotere and Cytoxan   INTERVAL HISTORY: Patricia Chandler is a 63 y.o. with above-mentioned history of recurrent right breast cancer who underwent a lumpectomy and is currently on adjuvant chemotherapy with Taxotere and Cytoxan. She presents to the clinic today  for a toxicity check following cycle 1.  She tolerated chemo extremely well.  She had mild fatigue yesterday and today.  She has had headaches intermittently.  REVIEW OF SYSTEMS:   Constitutional: Denies fevers, chills or abnormal weight loss Eyes: Denies blurriness of vision Ears, nose, mouth, throat, and face: Denies mucositis or sore throat Respiratory: Denies cough, dyspnea or wheezes Cardiovascular: Denies palpitation, chest discomfort Gastrointestinal: Mild nausea Skin: Denies abnormal skin rashes Lymphatics: Denies new lymphadenopathy or easy bruising Neurological: Denies numbness, tingling or new weaknesses Behavioral/Psych: Mood is stable, no new changes  Extremities: No lower extremity edema Breast: denies any pain or lumps or nodules in either breasts All other systems were reviewed with the patient and are negative.  I have reviewed the past medical history, past surgical history, social history and family history with the patient and they are unchanged from previous note.  ALLERGIES:  has No Known Allergies.  MEDICATIONS:  Current Outpatient Medications  Medication Sig Dispense Refill   calcium-vitamin D (OSCAL WITH D) 500-200 MG-UNIT tablet Take 1 tablet by mouth daily with breakfast.     carvedilol (COREG) 3.125 MG tablet Take 1 tablet (3.125 mg total) by mouth 2 (two) times daily. 180 tablet 3   denosumab (PROLIA) 60 MG/ML SOLN injection Inject 60 mg into the skin every 6 (six) months. Administer in upper arm, thigh, or abdomen     dexamethasone (DECADRON) 4 MG tablet Take 1 tablet (4 mg total) by mouth 2 (two) times daily. Take 1 tablet day before chemo and 1 tablet day after chemo with food 8 tablet 0   Lactobacillus (PROBIOTIC ACIDOPHILUS PO) Take 1 capsule by mouth daily.      letrozole (FEMARA) 2.5 MG tablet Take 1 tablet (2.5 mg total) by mouth daily. 90 tablet 3   lidocaine-prilocaine (EMLA) cream Apply to affected area once 30 g 3   LORazepam (ATIVAN)  0.5 MG tablet Take 1 tablet (0.5 mg total) by mouth at bedtime as needed for sleep. 30 tablet 0   losartan (COZAAR) 25 MG tablet Take 1 tablet (25 mg total) by mouth at bedtime. 90 tablet 3   omeprazole (PRILOSEC) 20 MG capsule Take 20 mg by mouth daily as needed (heartburn).     ondansetron (ZOFRAN) 8 MG tablet Take 1 tablet (8 mg total) by mouth 2 (two) times daily as needed for refractory nausea / vomiting. Start on day 3 after chemo. 30 tablet 1   oxyCODONE (OXY IR/ROXICODONE) 5 MG immediate release tablet Take 1 tablet (5 mg total) by mouth every 4 (four) hours as needed. 10 tablet 0   prochlorperazine (COMPAZINE) 10 MG tablet Take 1 tablet (10 mg total) by mouth every 6 (six) hours as needed (Nausea or vomiting). 30 tablet 1   Turmeric POWD Take 1 Scoop by mouth daily.     zolpidem (AMBIEN) 5 MG tablet Take 1 tablet (5 mg total) by mouth at bedtime as needed. for sleep (Patient taking differently: Take 2.5 mg by mouth at bedtime as needed for sleep. ) 30 tablet 2   No current facility-administered medications for this  visit.     PHYSICAL EXAMINATION: ECOG PERFORMANCE STATUS: 1 - Symptomatic but completely ambulatory  Vitals:   12/30/18 0813  BP: 105/76  Pulse: 97  Resp: 18  Temp: 98.2 F (36.8 C)  SpO2: 99%   Filed Weights   12/30/18 0813  Weight: 151 lb 11.2 oz (68.8 kg)    GENERAL: alert, no distress and comfortable SKIN: skin color, texture, turgor are normal, no rashes or significant lesions EYES: normal, Conjunctiva are pink and non-injected, sclera clear OROPHARYNX: no exudate, no erythema and lips, buccal mucosa, and tongue normal  NECK: supple, thyroid normal size, non-tender, without nodularity LYMPH: no palpable lymphadenopathy in the cervical, axillary or inguinal LUNGS: clear to auscultation and percussion with normal breathing effort HEART: regular rate & rhythm and no murmurs and no lower extremity edema ABDOMEN: abdomen soft, non-tender and normal  bowel sounds MUSCULOSKELETAL: no cyanosis of digits and no clubbing  NEURO: alert & oriented x 3 with fluent speech, no focal motor/sensory deficits EXTREMITIES: No lower extremity edema  LABORATORY DATA:  I have reviewed the data as listed CMP Latest Ref Rng & Units 12/23/2018 11/18/2018 06/16/2018  Glucose 70 - 99 mg/dL 84 91 90  BUN 8 - 23 mg/dL 24(H) 19 10  Creatinine 0.44 - 1.00 mg/dL 0.76 0.92 0.67  Sodium 135 - 145 mmol/L 138 138 138  Potassium 3.5 - 5.1 mmol/L 3.9 3.9 3.9  Chloride 98 - 111 mmol/L 107 106 113(H)  CO2 22 - 32 mmol/L 21(L) 21(L) 18(L)  Calcium 8.9 - 10.3 mg/dL 9.2 9.4 7.9(L)  Total Protein 6.5 - 8.1 g/dL 6.8 - -  Total Bilirubin 0.3 - 1.2 mg/dL 0.3 - -  Alkaline Phos 38 - 126 U/L 60 - -  AST 15 - 41 U/L 17 - -  ALT 0 - 44 U/L 15 - -    Lab Results  Component Value Date   WBC 8.0 12/30/2018   HGB 12.8 12/30/2018   HCT 38.3 12/30/2018   MCV 88.2 12/30/2018   PLT 134 (L) 12/30/2018   NEUTROABS PENDING 12/30/2018    ASSESSMENT & PLAN:  Breast cancer of upper-inner quadrant of right female breast ([place]) Right breast biopsy 2:00 6 cm from nipple: Grade 2-3 invasive ductal cancer, ER 90%, PR 2%, Ki-67 80%, HER-2 positive ratio 2.07; right biopsy 2:00 4 cm from nipple: IDC with DCIS, ER 95%, PR 10%, Ki-67 90%, HER-2 negative ratio 1.27 Breast MRI08/30/2017: 3 enhancing masses in the upper inner quadrant right breast spanning an area 3.7 cm (1.7 cm, 1 cm, 1.4 cm) no lymph node enlargement Clinical stage: T2N0 (stage 2A)  Treatment plan: 1. Genetic counseling: Normal did not reveal any abnormal mutations 2. Neoadjuvant chemotherapy. [facility] Perjeta 6 cycles followed by Herceptin And Perjetamaintenance completed 10/20/2016) from 12/31/15 to 04/15/16 3. Right mastectomy with reconstruction: 05/19/2016:Right mastectomy: IDC grade 3, 1.3 cm, low-grade DCIS, LVIDS present, margins negative, 0/1 lymph node negative, ypT1CypN0 stage IA; repeat HER-2 negative ratio 1.41 4.  Adjuvant antiestrogen therapy With letrozole 2.5 mg daily started March 2018 -------------------------------------------------------------------------------------------------------------------------------------------------- Breast cancer: Subcutaneous disease: 1.8 cm measured by ultrasound Biopsy revealed grade 2-3 IDC ER 90% PR 0% HER-2 negative Ki-67 40% 11/30/2018 right lumpectomy [name]): IDC, grade 3, 2.1cm, perineural invasion present, carcinoma present focally at the posterior margin and involving skeletal muscle.  Oncotype score 52: Greater than 39% risk of distant recurrence with hormone therapy alone, chemo benefit greater than 15%  Recommendation: 1.Surgery to remove the tumor 11/30/2018 2.Adjuvant chemotherapy with Taxotere and  Cytoxan x6 cycles started 12/30/2018 3.Adjuvant radiation 4.Follow-up adjuvant antiestrogen therapy ------------------------------------------------------------------------------------------------------------------------------------ Current treatment: Cycle 1 day 8 Taxotere and Cytoxan Labs reviewed   Chemo toxicities: 1.  Mild body aches 2.  Fatigue 3.  Mild nausea  Return to clinic in 2 weeks for cycle 2.  No orders of the defined types were placed in this encounter.  The patient has a good understanding of the overall plan. she agrees with it. she will call with any problems that may develop before the next visit here.  Nicholas Lose, MD 12/30/2018  Julious Oka Dorshimer am acting as scribe for Dr. Nicholas Lose.  I have reviewed the above documentation for accuracy and completeness, and I agree with the above.

## 2018-12-30 ENCOUNTER — Inpatient Hospital Stay (HOSPITAL_BASED_OUTPATIENT_CLINIC_OR_DEPARTMENT_OTHER): Payer: Medicare Other | Admitting: Hematology and Oncology

## 2018-12-30 ENCOUNTER — Other Ambulatory Visit: Payer: Self-pay

## 2018-12-30 ENCOUNTER — Inpatient Hospital Stay: Payer: Medicare Other

## 2018-12-30 DIAGNOSIS — C50211 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 female breast: Secondary | ICD-10-CM | POA: Diagnosis not present

## 2018-12-30 DIAGNOSIS — Z170 Estrogen receptor positive status [ER+]: Secondary | ICD-10-CM | POA: Diagnosis not present

## 2018-12-30 DIAGNOSIS — Z5111 Encounter for antineoplastic chemotherapy: Secondary | ICD-10-CM | POA: Diagnosis not present

## 2018-12-30 LAB — CBC WITH DIFFERENTIAL (CANCER CENTER ONLY)
Abs Immature Granulocytes: 0.3 10*3/uL — ABNORMAL HIGH (ref 0.00–0.07)
Basophils Absolute: 0.1 10*3/uL (ref 0.0–0.1)
Basophils Relative: 1 %
Eosinophils Absolute: 0.2 10*3/uL (ref 0.0–0.5)
Eosinophils Relative: 2 %
HCT: 38.3 % (ref 36.0–46.0)
Hemoglobin: 12.8 g/dL (ref 12.0–15.0)
Lymphocytes Relative: 74 %
Lymphs Abs: 5.9 10*3/uL — ABNORMAL HIGH (ref 0.7–4.0)
MCH: 29.5 pg (ref 26.0–34.0)
MCHC: 33.4 g/dL (ref 30.0–36.0)
MCV: 88.2 fL (ref 80.0–100.0)
Metamyelocytes Relative: 3 %
Monocytes Absolute: 0.6 10*3/uL (ref 0.1–1.0)
Monocytes Relative: 8 %
Myelocytes: 1 %
Neutro Abs: 0.9 10*3/uL — ABNORMAL LOW (ref 1.7–17.7)
Neutrophils Relative %: 11 %
Platelet Count: 134 10*3/uL — ABNORMAL LOW (ref 150–400)
RBC: 4.34 MIL/uL (ref 3.87–5.11)
RDW: 12.5 % (ref 11.5–15.5)
WBC Count: 8 10*3/uL (ref 4.0–10.5)
nRBC: 0.4 % — ABNORMAL HIGH (ref 0.0–0.2)

## 2018-12-30 LAB — CMP (CANCER CENTER ONLY)
ALT: 27 U/L (ref 0–44)
AST: 25 U/L (ref 15–41)
Albumin: 3.9 g/dL (ref 3.5–5.0)
Alkaline Phosphatase: 79 U/L (ref 38–126)
Anion gap: 8 (ref 5–15)
BUN: 14 mg/dL (ref 8–23)
CO2: 25 mmol/L (ref 22–32)
Calcium: 9.7 mg/dL (ref 8.9–10.3)
Chloride: 103 mmol/L (ref 98–111)
Creatinine: 0.88 mg/dL (ref 0.44–1.00)
GFR, Est AFR Am: >60 mL/min (ref >60)
GFR, Estimated: >60 mL/min (ref >60)
Glucose, Bld: 113 mg/dL — ABNORMAL HIGH (ref 70–99)
Potassium: 4.1 mmol/L (ref 3.5–5.1)
Sodium: 136 mmol/L (ref 135–145)
Total Bilirubin: 0.5 mg/dL (ref 0.3–1.2)
Total Protein: 6.4 g/dL — ABNORMAL LOW (ref 6.5–8.1)

## 2018-12-30 NOTE — Assessment & Plan Note (Signed)
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 Genetic counseling: Normal did not reveal any abnormal mutations 2. Neoadjuvant chemotherapy. [facility] Perjeta 6 cycles followed by Herceptin And Perjetamaintenance completed 10/20/2016) from 12/31/15 to 04/15/16 3. Right mastectomy with reconstruction: 05/19/2016:Right mastectomy: IDC grade 3, 1.3 cm, low-grade DCIS, LVIDS present, margins negative, 0/1 lymph node negative, ypT1CypN0 stage IA; repeat HER-2 negative ratio 1.41 4. Adjuvant antiestrogen therapy With letrozole 2.5 mg daily started March 2018 -------------------------------------------------------------------------------------------------------------------------------------------------- Breast cancer: Subcutaneous disease: 1.8 cm measured by ultrasound Biopsy revealed grade 2-3 IDC ER 90% PR 0% HER-2 negative Ki-67 40% 11/30/2018 right lumpectomy [name]): IDC, grade 3, 2.1cm, perineural invasion present, carcinoma present focally at the posterior margin and involving skeletal muscle.  Oncotype score 52: Greater than 39% risk of distant recurrence with hormone therapy alone, chemo benefit greater than 15%  Recommendation: 1.Surgery to remove the tumor 11/30/2018 2.Adjuvant chemotherapy with Taxotere and Cytoxan x6 cycles started 12/30/2018 3.Adjuvant radiation 4.Follow-up adjuvant antiestrogen therapy ------------------------------------------------------------------------------------------------------------------------------------ Current treatment: Cycle 1 Taxotere and Cytoxan Chemo  education completed Chemo consent obtained Labs reviewed Antiemetics reviewed  Return to clinic in 1 week for toxicity check

## 2019-01-02 ENCOUNTER — Other Ambulatory Visit: Payer: Self-pay | Admitting: *Deleted

## 2019-01-02 MED ORDER — DICYCLOMINE HCL 10 MG PO CAPS: 10.0000 mg | ORAL_CAPSULE | Freq: Three times a day (TID) | ORAL | 1 refills | Status: DC

## 2019-01-04 ENCOUNTER — Ambulatory Visit (INDEPENDENT_AMBULATORY_CARE_PROVIDER_SITE_OTHER): Payer: Medicare Other | Admitting: Psychology

## 2019-01-04 DIAGNOSIS — F064 Anxiety disorder due to known physiological condition: Secondary | ICD-10-CM

## 2019-01-06 ENCOUNTER — Encounter: Payer: Self-pay | Admitting: Nurse Practitioner

## 2019-01-06 ENCOUNTER — Other Ambulatory Visit: Payer: Self-pay

## 2019-01-06 ENCOUNTER — Ambulatory Visit (INDEPENDENT_AMBULATORY_CARE_PROVIDER_SITE_OTHER): Payer: Medicare Other | Admitting: Nurse Practitioner

## 2019-01-06 VITALS — BP 117/73 | Ht 63.0 in | Wt 149.0 lb

## 2019-01-06 DIAGNOSIS — Z8719 Personal history of other diseases of the digestive system: Secondary | ICD-10-CM

## 2019-01-06 NOTE — Progress Notes (Signed)
Telephonic Visit in setting of [place]  Patient has given consent for a telephonic visit today and understands that is the same as is required for any face-to-face patient encounter except that the service was provided via telephone.   At the time of this telephonic visit the patient was located at home and I, the Provider, at the office of [facility].   Patient was referred to [facility] by XXX (if referred)  No one other than XXX and I participated in this telephonic visit today.   Total time of telephonic visit :  < 10 minutes.    Chief Complaint:    Prevention of recurrent diverticulitis  IMPRESSION and PLAN:    63 year old female with history of diverticulitis.  No issues at present but patient is back on chemotherapy for recurrent breast cancer and concerned about what to do should she get recurrent diverticulitis. Would like to avoid ED / Hospital on chemotherapy.  -Brief discussion with [place].  Explained that there really is not anything she can do to prevent another episode of diverticulitis.  At onset of any abdominal pain reminiscent of diverticulitis she should call our office immediately.  Many times we will start antibiotics with plans for office follow-up within a few days. This can hopefully help her avoid ED / Hospital.   HPI:     Patient is a 63 yo female with a history of diverticulitis / abscess in Feb 2020. No recurrent episodes.  Patient unfortunately has had a recurrence of breast cancer and is back on chemotherapy.  She just wanted to talk over the phone about half the take should she get recurrent diverticulitis symptoms and whether there is anything in the way of prevention.    Past Medical History:  Diagnosis Date  . Anemia    due to chemo  . Anxiety   . Breast cancer ([place]) dx'd 2017   right  . Cardiomyopathy ([place])    mild, per cardiology note; due to Herceptin  . Dental crowns present   . Family history of  adverse reaction to anesthesia    pt's daughter has hx. of post-op nausea  . GERD (gastroesophageal reflux disease)   . Heart murmur   . History of breast cancer 2017   right  . History of chemotherapy    finished chemo 04/15/2016  . Personal history of chemotherapy   . Rash 08/04/2016   under right arm - states recent tick bite; to see MD 08/05/2016  . Recurrent breast cancer, right ([place]) dx'd 10/2018  . Runny nose 06/06/2016   clear drainage, per pt.     Current Outpatient Medications  Medication Sig Dispense Refill  . calcium-vitamin D (OSCAL WITH D) 500-200 MG-UNIT tablet Take 1 tablet by mouth daily with breakfast.    . cyclophosphamide in sodium chloride 0.9 % 250 mL Inject into the vein as directed. Once every three weeks  Not sure of dose    . denosumab (PROLIA) 60 MG/ML SOLN injection Inject 60 mg into the skin every 6 (six) months. Administer in upper arm, thigh, or abdomen    . dexamethasone (DECADRON) 4 MG tablet Take 1 tablet (4 mg total) by mouth 2 (two) times daily. Take 1 tablet day before chemo and 1 tablet day after chemo with food 8 tablet 0  . dicyclomine (BENTYL) 10 MG capsule Take 1 capsule (10 mg total) by mouth 4 (four) times daily -  before meals and at bedtime. (Patient taking differently: Take  10 mg by mouth as needed. ) 60 capsule 1  . DOCEtaxel (TAXOTERE IV) Inject into the vein as directed. Once every three weeks    . Lactobacillus (PROBIOTIC ACIDOPHILUS PO) Take 1 capsule by mouth daily.     Marland Kitchen lidocaine-prilocaine (EMLA) cream Apply to affected area once 30 g 3  . LORazepam (ATIVAN) 0.5 MG tablet Take 1 tablet (0.5 mg total) by mouth at bedtime as needed for sleep. 30 tablet 0  . omeprazole (PRILOSEC) 20 MG capsule Take 20 mg by mouth daily as needed (heartburn).    . ondansetron (ZOFRAN) 8 MG tablet Take 1 tablet (8 mg total) by mouth 2 (two) times daily as needed for refractory nausea / vomiting. Start on day 3 after chemo. 30 tablet 1  . prochlorperazine  (COMPAZINE) 10 MG tablet Take 1 tablet (10 mg total) by mouth every 6 (six) hours as needed (Nausea or vomiting). 30 tablet 1  . zolpidem (AMBIEN) 5 MG tablet Take 1 tablet (5 mg total) by mouth at bedtime as needed. for sleep (Patient taking differently: Take 2.5 mg by mouth at bedtime as needed for sleep. ) 30 tablet 2   No current facility-administered medications for this visit.     Physical Exam:     BP 117/73   Ht 5\' 3"  (1.6 m)   Wt 149 lb (67.6 kg)   BMI 26.39 kg/m    Tye Savoy , NP 01/06/2019, 4:09 PM

## 2019-01-11 ENCOUNTER — Encounter: Payer: Self-pay | Admitting: Nurse Practitioner

## 2019-01-12 ENCOUNTER — Other Ambulatory Visit: Payer: Medicare Other

## 2019-01-12 ENCOUNTER — Ambulatory Visit: Payer: Medicare Other

## 2019-01-12 ENCOUNTER — Ambulatory Visit (INDEPENDENT_AMBULATORY_CARE_PROVIDER_SITE_OTHER): Payer: Medicare Other

## 2019-01-12 DIAGNOSIS — M810 Age-related osteoporosis without current pathological fracture: Secondary | ICD-10-CM

## 2019-01-12 MED ORDER — DENOSUMAB 60 MG/ML ~~LOC~~ SOSY
60.0000 mg | PREFILLED_SYRINGE | Freq: Once | SUBCUTANEOUS | Status: AC
  Administered 2019-01-12: 60 mg via SUBCUTANEOUS

## 2019-01-12 NOTE — Progress Notes (Signed)
Patient Care Team: [name], MD as PCP - General (Family Medicine)  DIAGNOSIS:    ICD-10-CM   1. 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 breast in female, estrogen receptor positive ([place])  C50.211    Z17.0     SUMMARY OF ONCOLOGIC HISTORY: Oncology History  Breast cancer of upper-inner quadrant of right female breast ([place])  12/05/2015 Mammogram   Right breast mass 2:00 4 cm from nipple: 1.7 cm, T1c N0 stage IA; right breast 2:00 6 cm from nipple 1.3 cm mass, 7 mm satellite nodule between the 2 masses, T1 cN0 stage IA   12/06/2015 Initial Diagnosis   Right breast biopsy 2:00 6 cm from nipple: Grade 2-3 invasive ductal cancer, ER 90%, PR 2%, Ki-67 80%, HER-2 positive ratio 2.07; right biopsy 2:00 4 cm from nipple: IDC with DCIS, ER 95%, PR 10%, Ki-67 90%, HER-2 negative ratio 1.27   12/18/2015 Breast MRI   3 enhancing masses in the upper inner quadrant right breast spanning an area 3.7 cm (1.7 cm, 1 cm, 1.4 cm) no lymph node enlargement   12/31/2015 - 04/15/2016 Neo-Adjuvant Chemotherapy   TCH Perjeta 6 cycles followed by Herceptin, Perjeta maintenance for 1 year completed 10/20/2016   01/08/2016 - 01/12/2016 Hospital Admission   Neutropenic fever hospitalization (because patient did not receive Neulasta with cycle 1)   01/10/2016 Miscellaneous   Genetic testing was normal did not reveal any mutations   04/16/2016 Breast MRI   Near CR to therapy. Previous 2 biopsied massses are no longer seen. Third satellite lesion is smaller from 10 mm to 5.3 mm, no abnormal LN.   05/19/2016 Surgery   Right mastectomy: IDC grade 3, 1.3 cm, low-grade DCIS, LVIDS present, margins negative, 0/1 lymph node negative, ypT1CypN0 stage IA; repeat HER-2 negative ratio 1.41   05/19/2016 Surgery   Right breast reconstruction with tissue expander. Acellular dermis for breast reconstruction (Dr.Thimappa)    06/22/2016 -  Anti-estrogen oral therapy   Letrozole 2.5 mg daily   11/01/2018  Relapse/Recurrence   Patient palpated lump in the UIQ at surgical scar of reconstructed right breast. US of the right breast showed a 1.8cm mass suspicious for recurrent breast cancer. Biopsy showed grade 2 invasive ductal carcinoma, HER-2 negative (1+), ER 90%, PR negative, Ki67 40%.   11/11/2018 Oncotype testing   Oncotype score 52: Greater than 39% risk of distant recurrence with hormone therapy alone, chemo benefit greater than 15%   11/30/2018 Surgery   Right lumpectomy [name]): IDC, grade 3, 2.1cm, perineural invasion present, carcinoma present focally at the posterior margin and involving skeletal muscle.   11/30/2018 Cancer Staging   Staging form: Breast, AJCC 8th Edition - Pathologic stage from 11/30/2018: Stage IIA (pT2, pN0, cM0, G3, ER+, PR-, HER2-) - Signed by Gardenia Phlegm, NP on 12/14/2018   12/23/2018 -  Chemotherapy   The patient had palonosetron (ALOXI) injection 0.25 mg, 0.25 mg, Intravenous,  Once, 1 of 6 cycles Administration: 0.25 mg (12/23/2018) pegfilgrastim (NEULASTA ONPRO KIT) injection 6 mg, 6 mg, Subcutaneous, Once, 1 of 6 cycles Administration: 6 mg (12/23/2018) cyclophosphamide (CYTOXAN) 1,060 mg in sodium chloride 0.9 % 250 mL chemo infusion, 600 mg/m2 = 1,060 mg, Intravenous,  Once, 1 of 6 cycles Administration: 1,060 mg (12/23/2018) DOCEtaxel (TAXOTERE) 130 mg in sodium chloride 0.9 % 250 mL chemo infusion, 75 mg/m2 = 130 mg, Intravenous,  Once, 1 of 6 cycles Administration: 130 mg (12/23/2018) fosaprepitant (EMEND) 150 mg in sodium chloride 0.9 % 145 mL IVPB, ,  Intravenous,  Once, 1 of 6 cycles Administration:  (12/23/2018)  for chemotherapy treatment.      CHIEF COMPLIANT: Cycle 2 Taxotere and Cytoxan   INTERVAL HISTORY: Patricia Chandler is a 63 y.o. with above-mentioned history of recurrent right breast cancer who underwent a lumpectomy and is currently on adjuvant chemotherapy with Taxotere and Cytoxan. She presents to the clinic today for  cycle 2.  She has done extremely well from chemo standpoint however for the past several days she has been experiencing dental pain and went to see her dentist who informed her that wisdom teeth need to be removed.  She has had headaches after chemotherapy for 7 days.  She also had some body aches and pains from Neulasta.   REVIEW OF SYSTEMS:   Constitutional: Denies fevers, chills or abnormal weight loss Eyes: Denies blurriness of vision Ears, nose, mouth, throat, and face: Denies mucositis or sore throat Respiratory: Denies cough, dyspnea or wheezes Cardiovascular: Denies palpitation, chest discomfort Gastrointestinal: Denies nausea, heartburn or change in bowel habits Skin: Denies abnormal skin rashes Lymphatics: Denies new lymphadenopathy or easy bruising Neurological: Denies numbness, tingling or new weaknesses Behavioral/Psych: Mood is stable, no new changes  Extremities: No lower extremity edema Breast: denies any pain or lumps or nodules in either breasts All other systems were reviewed with the patient and are negative.  I have reviewed the past medical history, past surgical history, social history and family history with the patient and they are unchanged from previous note.  ALLERGIES:  has No Known Allergies.  MEDICATIONS:  Current Outpatient Medications  Medication Sig Dispense Refill  . calcium-vitamin D (OSCAL WITH D) 500-200 MG-UNIT tablet Take 1 tablet by mouth daily with breakfast.    . cyclophosphamide in sodium chloride 0.9 % 250 mL Inject into the vein as directed. Once every three weeks  Not sure of dose    . denosumab (PROLIA) 60 MG/ML SOLN injection Inject 60 mg into the skin every 6 (six) months. Administer in upper arm, thigh, or abdomen    . dexamethasone (DECADRON) 4 MG tablet Take 1 tablet (4 mg total) by mouth 2 (two) times daily. Take 1 tablet day before chemo and 1 tablet day after chemo with food 8 tablet 0  . dicyclomine (BENTYL) 10 MG capsule Take 1  capsule (10 mg total) by mouth 4 (four) times daily -  before meals and at bedtime. (Patient taking differently: Take 10 mg by mouth as needed. ) 60 capsule 1  . DOCEtaxel (TAXOTERE IV) Inject into the vein as directed. Once every three weeks    . Lactobacillus (PROBIOTIC ACIDOPHILUS PO) Take 1 capsule by mouth daily.     Marland Kitchen lidocaine-prilocaine (EMLA) cream Apply to affected area once 30 g 3  . LORazepam (ATIVAN) 0.5 MG tablet Take 1 tablet (0.5 mg total) by mouth at bedtime as needed for sleep. 30 tablet 0  . omeprazole (PRILOSEC) 20 MG capsule Take 20 mg by mouth daily as needed (heartburn).    . ondansetron (ZOFRAN) 8 MG tablet Take 1 tablet (8 mg total) by mouth 2 (two) times daily as needed for refractory nausea / vomiting. Start on day 3 after chemo. 30 tablet 1  . oxyCODONE-acetaminophen (PERCOCET/ROXICET) 5-325 MG tablet Take 1 tablet by mouth every 6 (six) hours as needed for severe pain. 30 tablet 0  . prochlorperazine (COMPAZINE) 10 MG tablet Take 1 tablet (10 mg total) by mouth every 6 (six) hours as needed (Nausea or vomiting). 30 tablet 1  .  zolpidem (AMBIEN) 5 MG tablet Take 1 tablet (5 mg total) by mouth at bedtime as needed. for sleep (Patient taking differently: Take 2.5 mg by mouth at bedtime as needed for sleep. ) 30 tablet 2   No current facility-administered medications for this visit.     PHYSICAL EXAMINATION: ECOG PERFORMANCE STATUS: 1 - Symptomatic but completely ambulatory  Vitals:   01/13/19 1015  BP: 128/86  Pulse: 86  Resp: 18  Temp: 98.2 F (36.8 C)  SpO2: 98%   Filed Weights   01/13/19 1015  Weight: 155 lb 4.8 oz (70.4 kg)    GENERAL: alert, no distress and comfortable SKIN: skin color, texture, turgor are normal, no rashes or significant lesions EYES: normal, Conjunctiva are pink and non-injected, sclera clear OROPHARYNX: no exudate, no erythema and lips, buccal mucosa, and tongue normal  NECK: supple, thyroid normal size, non-tender, without  nodularity LYMPH: no palpable lymphadenopathy in the cervical, axillary or inguinal LUNGS: clear to auscultation and percussion with normal breathing effort HEART: regular rate & rhythm and no murmurs and no lower extremity edema ABDOMEN: abdomen soft, non-tender and normal bowel sounds MUSCULOSKELETAL: no cyanosis of digits and no clubbing  NEURO: alert & oriented x 3 with fluent speech, no focal motor/sensory deficits EXTREMITIES: No lower extremity edema  LABORATORY DATA:  I have reviewed the data as listed CMP Latest Ref Rng & Units 01/13/2019 12/30/2018 12/23/2018  Glucose 70 - 99 mg/dL 88 113(H) 84  BUN 8 - 23 mg/dL 14 14 24(H)  Creatinine 0.44 - 1.00 mg/dL 0.76 0.88 0.76  Sodium 135 - 145 mmol/L 139 136 138  Potassium 3.5 - 5.1 mmol/L 4.1 4.1 3.9  Chloride 98 - 111 mmol/L 109 103 107  CO2 22 - 32 mmol/L 25 25 21(L)  Calcium 8.9 - 10.3 mg/dL 8.9 9.7 9.2  Total Protein 6.5 - 8.1 g/dL 6.3(L) 6.4(L) 6.8  Total Bilirubin 0.3 - 1.2 mg/dL 0.3 0.5 0.3  Alkaline Phos 38 - 126 U/L 56 79 60  AST 15 - 41 U/L _0 ALT 0 - 44 U/L 33 27 15    Lab Results  Component Value Date   WBC 7.3 01/13/2019   HGB 10.9 (L) 01/13/2019   HCT 32.5 (L) 01/13/2019   MCV 89.5 01/13/2019   PLT 326 01/13/2019   NEUTROABS 4.9 01/13/2019    ASSESSMENT & PLAN:  Breast cancer of upper-inner quadrant of right female breast ([facility]) Right breast biopsy 2:00 6 cm from nipple: Grade 2-3 invasive ductal cancer, ER 90%, PR 2%, Ki-67 80%, HER-2 positive ratio 2.07; right biopsy 2:00 4 cm from nipple: IDC with DCIS, ER 95%, PR 10%, Ki-67 90%, HER-2 negative ratio 1.27 Breast MRI08/30/2017: 3 enhancing masses in the upper inner quadrant right breast spanning an area 3.7 cm (1.7 cm, 1 cm, 1.4 cm) no lymph node enlargement Clinical stage: T2N0 (stage 2A)  Treatment plan: 1. Genetic counseling: Normal did not reveal any abnormal mutations 2. Neoadjuvant chemotherapy. [facility] Perjeta 6 cycles followed by Herceptin And  Perjetamaintenance completed 10/20/2016) from 12/31/15 to 04/15/16 3. Right mastectomy with reconstruction: 05/19/2016:Right mastectomy: IDC grade 3, 1.3 cm, low-grade DCIS, LVIDS present, margins negative, 0/1 lymph node negative, ypT1CypN0 stage IA; repeat HER-2 negative ratio 1.41 4. Adjuvant antiestrogen therapy With letrozole 2.5 mg daily started March 2018 -------------------------------------------------------------------------------------------------------------------------------------------------- Breast cancer: Subcutaneous disease: 1.8 cm measured by ultrasound Biopsy revealed grade 2-3 IDC ER 90% PR 0% HER-2 negative Ki-67 40% 11/30/2018 right lumpectomy [name]): IDC, grade 3, 2.1cm, perineural  invasion present, carcinoma present focally at the posterior margin and involving skeletal muscle. Oncotype score 52: Greater than 39% risk of distant recurrence with hormone therapy alone, chemo benefit greater than 15%  Recommendation: 1.Surgery to remove the tumor8/03/2019 2.Adjuvant chemotherapy withTaxotere and Cytoxan x6 cycles started 12/30/2018 3.Adjuvant radiation 4.Follow-up adjuvant antiestrogen therapy ------------------------------------------------------------------------------------------------------------------------------------ Current treatment: Cycle 2-day 1 Taxotere and Cytoxan Labs reviewed   Chemo toxicities: 1.  Mild body aches 2.  Fatigue 3. Headaches: Probably due to [place] 4.  Chemotherapy-induced anemia: Hemoglobin is 10.9 and has decreased substantially from before.  We will continue to watch and monitor this.  It appears that the patient might need wisdom tooth removal.  We discussed the appropriate timing for the surgery. Because of severity of the pain I sent a prescription for Percocet.  She will take Tylenol and if the pain gets really severe she will take Percocets.  She will still need to see dental surgeon to get the teeth removed.  Return  to clinic in  3 weeks for cycle 3    No orders of the defined types were placed in this encounter.  The patient has a good understanding of the overall plan. she agrees with it. she will call with any problems that may develop before the next visit here.  Nicholas Lose, MD 01/13/2019  Julious Oka Dorshimer am acting as scribe for Dr. Nicholas Lose.  I have reviewed the above documentation for accuracy and completeness, and I agree with the above.

## 2019-01-13 ENCOUNTER — Inpatient Hospital Stay: Payer: Medicare Other

## 2019-01-13 ENCOUNTER — Inpatient Hospital Stay (HOSPITAL_BASED_OUTPATIENT_CLINIC_OR_DEPARTMENT_OTHER): Payer: Medicare Other | Admitting: Hematology and Oncology

## 2019-01-13 ENCOUNTER — Other Ambulatory Visit: Payer: Self-pay

## 2019-01-13 ENCOUNTER — Encounter: Payer: Self-pay | Admitting: Hematology and Oncology

## 2019-01-13 ENCOUNTER — Encounter: Payer: Self-pay | Admitting: *Deleted

## 2019-01-13 DIAGNOSIS — C50211 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 female breast: Secondary | ICD-10-CM

## 2019-01-13 DIAGNOSIS — Z5111 Encounter for antineoplastic chemotherapy: Secondary | ICD-10-CM | POA: Diagnosis not present

## 2019-01-13 DIAGNOSIS — Z170 Estrogen receptor positive status [ER+]: Secondary | ICD-10-CM

## 2019-01-13 DIAGNOSIS — Z95828 Presence of other vascular implants and grafts: Secondary | ICD-10-CM

## 2019-01-13 LAB — CMP (CANCER CENTER ONLY)
ALT: 33 U/L (ref 0–44)
AST: 29 U/L (ref 15–41)
Albumin: 4.1 g/dL (ref 3.5–5.0)
Alkaline Phosphatase: 56 U/L (ref 38–126)
Anion gap: 5 (ref 5–15)
BUN: 14 mg/dL (ref 8–23)
CO2: 25 mmol/L (ref 22–32)
Calcium: 8.9 mg/dL (ref 8.9–10.3)
Chloride: 109 mmol/L (ref 98–111)
Creatinine: 0.76 mg/dL (ref 0.44–1.00)
GFR, Est AFR Am: >60 mL/min (ref >60)
GFR, Estimated: >60 mL/min (ref >60)
Glucose, Bld: 88 mg/dL (ref 70–99)
Potassium: 4.1 mmol/L (ref 3.5–5.1)
Sodium: 139 mmol/L (ref 135–145)
Total Bilirubin: 0.3 mg/dL (ref 0.3–1.2)
Total Protein: 6.3 g/dL — ABNORMAL LOW (ref 6.5–8.1)

## 2019-01-13 LAB — CBC WITH DIFFERENTIAL (CANCER CENTER ONLY)
Abs Immature Granulocytes: 0.02 10*3/uL (ref 0.00–0.07)
Basophils Absolute: 0.1 10*3/uL (ref 0.0–0.1)
Basophils Relative: 1 %
Eosinophils Absolute: 0 10*3/uL (ref 0.0–0.5)
Eosinophils Relative: 0 %
HCT: 32.5 % — ABNORMAL LOW (ref 36.0–46.0)
Hemoglobin: 10.9 g/dL — ABNORMAL LOW (ref 12.0–15.0)
Immature Granulocytes: 0 %
Lymphocytes Relative: 21 %
Lymphs Abs: 1.6 10*3/uL (ref 0.7–4.0)
MCH: 30 pg (ref 26.0–34.0)
MCHC: 33.5 g/dL (ref 30.0–36.0)
MCV: 89.5 fL (ref 80.0–100.0)
Monocytes Absolute: 0.8 10*3/uL (ref 0.1–1.0)
Monocytes Relative: 11 %
Neutro Abs: 4.9 10*3/uL (ref 1.7–7.7)
Neutrophils Relative %: 67 %
Platelet Count: 326 10*3/uL (ref 150–400)
RBC: 3.63 MIL/uL — ABNORMAL LOW (ref 3.87–5.11)
RDW: 13.7 % (ref 11.5–15.5)
WBC Count: 7.3 10*3/uL (ref 4.0–10.5)
nRBC: 0 % (ref 0.0–0.2)

## 2019-01-13 MED ORDER — PALONOSETRON HCL INJECTION 0.25 MG/5ML
INTRAVENOUS | Status: AC
  Filled 2019-01-13: qty 5

## 2019-01-13 MED ORDER — DEXAMETHASONE SODIUM PHOSPHATE 10 MG/ML IJ SOLN
INTRAMUSCULAR | Status: AC
  Filled 2019-01-13: qty 1

## 2019-01-13 MED ORDER — OXYCODONE-ACETAMINOPHEN 5-325 MG PO TABS: 1.0000 | ORAL_TABLET | Freq: Four times a day (QID) | ORAL | 0 refills | Status: DC | PRN

## 2019-01-13 MED ORDER — PEGFILGRASTIM 6 MG/0.6ML ~~LOC~~ PSKT
6.0000 mg | PREFILLED_SYRINGE | Freq: Once | SUBCUTANEOUS | Status: AC
  Administered 2019-01-13: 6 mg via SUBCUTANEOUS

## 2019-01-13 MED ORDER — SODIUM CHLORIDE 0.9% FLUSH
10.0000 mL | INTRAVENOUS | Status: DC | PRN
  Administered 2019-01-13: 10 mL via INTRAVENOUS
  Filled 2019-01-13: qty 10

## 2019-01-13 MED ORDER — HEPARIN SOD (PORK) LOCK FLUSH 100 UNIT/ML IV SOLN
500.0000 [IU] | Freq: Once | INTRAVENOUS | Status: AC | PRN
  Administered 2019-01-13: 500 [IU]
  Filled 2019-01-13: qty 5

## 2019-01-13 MED ORDER — DEXAMETHASONE SODIUM PHOSPHATE 10 MG/ML IJ SOLN: 10.0000 mg | Freq: Once | INTRAMUSCULAR | Status: DC

## 2019-01-13 MED ORDER — PEGFILGRASTIM 6 MG/0.6ML ~~LOC~~ PSKT
PREFILLED_SYRINGE | SUBCUTANEOUS | Status: AC
  Filled 2019-01-13: qty 0.6

## 2019-01-13 MED ORDER — SODIUM CHLORIDE 0.9 % IV SOLN
Freq: Once | INTRAVENOUS | Status: AC
  Administered 2019-01-13: 11:00:00 via INTRAVENOUS
  Filled 2019-01-13: qty 250

## 2019-01-13 MED ORDER — SODIUM CHLORIDE 0.9 % IV SOLN
600.0000 mg/m2 | Freq: Once | INTRAVENOUS | Status: AC
  Administered 2019-01-13: 1060 mg via INTRAVENOUS
  Filled 2019-01-13: qty 53

## 2019-01-13 MED ORDER — PALONOSETRON HCL INJECTION 0.25 MG/5ML
0.2500 mg | Freq: Once | INTRAVENOUS | Status: AC
  Administered 2019-01-13: 0.25 mg via INTRAVENOUS

## 2019-01-13 MED ORDER — SODIUM CHLORIDE 0.9 % IV SOLN: Freq: Once | INTRAVENOUS | Status: DC

## 2019-01-13 MED ORDER — SODIUM CHLORIDE 0.9 % IV SOLN
Freq: Once | INTRAVENOUS | Status: AC
  Administered 2019-01-13: 12:00:00 via INTRAVENOUS
  Filled 2019-01-13: qty 5

## 2019-01-13 MED ORDER — HEPARIN SOD (PORK) LOCK FLUSH 100 UNIT/ML IV SOLN
500.0000 [IU] | Freq: Once | INTRAVENOUS | Status: DC
  Filled 2019-01-13: qty 5

## 2019-01-13 MED ORDER — SODIUM CHLORIDE 0.9 % IV SOLN
75.0000 mg/m2 | Freq: Once | INTRAVENOUS | Status: AC
  Administered 2019-01-13: 130 mg via INTRAVENOUS
  Filled 2019-01-13: qty 13

## 2019-01-13 MED ORDER — SODIUM CHLORIDE 0.9% FLUSH
10.0000 mL | INTRAVENOUS | Status: DC | PRN
  Administered 2019-01-13: 10 mL
  Filled 2019-01-13: qty 10

## 2019-01-13 NOTE — Progress Notes (Signed)
Met w/ pt and she provided me w/ proof of income and she was approved for the $1000 [employer].

## 2019-01-13 NOTE — Progress Notes (Signed)
Please disregard the last note

## 2019-01-13 NOTE — Patient Instructions (Signed)
[facility] Discharge Instructions for Patients Receiving Chemotherapy  Today you received the following chemotherapy agents: Taxotere, Cytoxan  To help prevent nausea and vomiting after your treatment, we encourage you to take your nausea medication as directed.   If you develop nausea and vomiting that is not controlled by your nausea medication, call the clinic.   BELOW ARE SYMPTOMS THAT SHOULD BE REPORTED IMMEDIATELY:  *FEVER GREATER THAN 100.5 F  *CHILLS WITH OR WITHOUT FEVER  NAUSEA AND VOMITING THAT IS NOT CONTROLLED WITH YOUR NAUSEA MEDICATION  *UNUSUAL SHORTNESS OF BREATH  *UNUSUAL BRUISING OR BLEEDING  TENDERNESS IN MOUTH AND THROAT WITH OR WITHOUT PRESENCE OF ULCERS  *URINARY PROBLEMS  *BOWEL PROBLEMS  UNUSUAL RASH Items with * indicate a potential emergency and should be followed up as soon as possible.  Feel free to call the clinic should you have any questions or concerns. The clinic phone number is [phone].  Please show the [place] at check-in to the Emergency Department and triage nurse.  Docetaxel injection What is this medicine? DOCETAXEL (doe se TAX el) is a chemotherapy drug. It targets fast dividing cells, like cancer cells, and causes these cells to die. This medicine is used to treat many types of cancers like breast cancer, certain stomach cancers, head and neck cancer, lung cancer, and prostate cancer. This medicine may be used for other purposes; ask your health care provider or pharmacist if you have questions. COMMON BRAND NAME(S): Docefrez, Taxotere What should I tell my health care provider before I take this medicine? They need to know if you have any of these conditions:  infection (especially a virus infection such as chickenpox, cold sores, or herpes)  liver disease  low blood counts, like low white cell, platelet, or red cell counts  an unusual or allergic reaction to docetaxel, polysorbate 80, other  chemotherapy agents, other medicines, foods, dyes, or preservatives  pregnant or trying to get pregnant  breast-feeding How should I use this medicine? This drug is given as an infusion into a vein. It is administered in a hospital or clinic by a specially trained health care professional. Talk to your pediatrician regarding the use of this medicine in children. Special care may be needed. Overdosage: If you think you have taken too much of this medicine contact a poison control center or emergency room at once. NOTE: This medicine is only for you. Do not share this medicine with others. What if I miss a dose? It is important not to miss your dose. Call your doctor or health care professional if you are unable to keep an appointment. What may interact with this medicine?  aprepitant  certain antibiotics like erythromycin or clarithromycin  certain antivirals for HIV or hepatitis  certain medicines for fungal infections like fluconazole, itraconazole, ketoconazole, posaconazole, or voriconazole  cimetidine  ciprofloxacin  conivaptan  cyclosporine  dronedarone  fluvoxamine  grapefruit juice  imatinib  verapamil This list may not describe all possible interactions. Give your health care provider a list of all the medicines, herbs, non-prescription drugs, or dietary supplements you use. Also tell them if you smoke, drink alcohol, or use illegal drugs. Some items may interact with your medicine. What should I watch for while using this medicine? Your condition will be monitored carefully while you are receiving this medicine. You will need important blood work done while you are taking this medicine. Call your doctor or health care professional for advice if you get a fever, chills  or sore throat, or other symptoms of a cold or flu. Do not treat yourself. This drug decreases your body's ability to fight infections. Try to avoid being around people who are sick. Some products may  contain alcohol. Ask your health care professional if this medicine contains alcohol. Be sure to tell all health care professionals you are taking this medicine. Certain medicines, like metronidazole and disulfiram, can cause an unpleasant reaction when taken with alcohol. The reaction includes flushing, headache, nausea, vomiting, sweating, and increased thirst. The reaction can last from 30 minutes to several hours. You may get drowsy or dizzy. Do not drive, use machinery, or do anything that needs mental alertness until you know how this medicine affects you. Do not stand or sit up quickly, especially if you are an older patient. This reduces the risk of dizzy or fainting spells. Alcohol may interfere with the effect of this medicine. Talk to your health care professional about your risk of cancer. You may be more at risk for certain types of cancer if you take this medicine. Do not become pregnant while taking this medicine or for 6 months after stopping it. Women should inform their doctor if they wish to become pregnant or think they might be pregnant. There is a potential for serious side effects to an unborn child. Talk to your health care professional or pharmacist for more information. Do not breast-feed an infant while taking this medicine or for 1 to 2 weeks after stopping it. Males who get this medicine must use a condom during sex with females who can get pregnant. If you get a woman pregnant, the baby could have birth defects. The baby could die before they are born. You will need to continue wearing a condom for 3 months after stopping the medicine. Tell your health care provider right away if your partner becomes pregnant while you are taking this medicine. This may interfere with the ability to father a child. You should talk to your doctor or health care professional if you are concerned about your fertility. What side effects may I notice from receiving this medicine? Side effects that you  should report to your doctor or health care professional as soon as possible:  allergic reactions like skin rash, itching or hives, swelling of the face, lips, or tongue  blurred vision  breathing problems  changes in vision  low blood counts - This drug may decrease the number of white blood cells, red blood cells and platelets. You may be at increased risk for infections and bleeding.  nausea and vomiting  pain, redness or irritation at site where injected  pain, tingling, numbness in the hands or feet  redness, blistering, peeling, or loosening of the skin, including inside the mouth  signs of decreased platelets or bleeding - bruising, pinpoint red spots on the skin, black, tarry stools, nosebleeds  signs of decreased red blood cells - unusually weak or tired, fainting spells, lightheadedness  signs of infection - fever or chills, cough, sore throat, pain or difficulty passing urine  swelling of the ankle, feet, hands Side effects that usually do not require medical attention (report to your doctor or health care professional if they continue or are bothersome):  constipation  diarrhea  fingernail or toenail changes  hair loss  loss of appetite  mouth sores  muscle pain This list may not describe all possible side effects. Call your doctor for medical advice about side effects. You may report side effects to FDA at  1-800-FDA-1088. Where should I keep my medicine? This drug is given in a hospital or clinic and will not be stored at home. NOTE: This sheet is a summary. It may not cover all possible information. If you have questions about this medicine, talk to your doctor, pharmacist, or health care provider.  2020 Elsevier/Gold Standard (2018-06-10 12:23:11)   Cyclophosphamide injection What is this medicine? CYCLOPHOSPHAMIDE (sye kloe FOSS fa mide) is a chemotherapy drug. It slows the growth of cancer cells. This medicine is used to treat many types of cancer  like lymphoma, myeloma, leukemia, breast cancer, and ovarian cancer, to name a few. This medicine may be used for other purposes; ask your health care provider or pharmacist if you have questions. COMMON BRAND NAME(S): Cytoxan, Neosar What should I tell my health care provider before I take this medicine? They need to know if you have any of these conditions:  blood disorders  history of other chemotherapy  infection  kidney disease  liver disease  recent or ongoing radiation therapy  tumors in the bone marrow  an unusual or allergic reaction to cyclophosphamide, other chemotherapy, other medicines, foods, dyes, or preservatives  pregnant or trying to get pregnant  breast-feeding How should I use this medicine? This drug is usually given as an injection into a vein or muscle or by infusion into a vein. It is administered in a hospital or clinic by a specially trained health care professional. Talk to your pediatrician regarding the use of this medicine in children. Special care may be needed. Overdosage: If you think you have taken too much of this medicine contact a poison control center or emergency room at once. NOTE: This medicine is only for you. Do not share this medicine with others. What if I miss a dose? It is important not to miss your dose. Call your doctor or health care professional if you are unable to keep an appointment. What may interact with this medicine? This medicine may interact with the following medications:  amiodarone  amphotericin B  azathioprine  certain antiviral medicines for HIV or AIDS such as protease inhibitors (e.g., indinavir, ritonavir) and zidovudine  certain blood pressure medications such as benazepril, captopril, enalapril, fosinopril, lisinopril, moexipril, monopril, perindopril, quinapril, ramipril, trandolapril  certain cancer medications such as anthracyclines (e.g., daunorubicin, doxorubicin), busulfan, cytarabine, paclitaxel,  pentostatin, tamoxifen, trastuzumab  certain diuretics such as chlorothiazide, chlorthalidone, hydrochlorothiazide, indapamide, metolazone  certain medicines that treat or prevent blood clots like warfarin  certain muscle relaxants such as succinylcholine  cyclosporine  etanercept  indomethacin  medicines to increase blood counts like filgrastim, pegfilgrastim, sargramostim  medicines used as general anesthesia  metronidazole  natalizumab This list may not describe all possible interactions. Give your health care provider a list of all the medicines, herbs, non-prescription drugs, or dietary supplements you use. Also tell them if you smoke, drink alcohol, or use illegal drugs. Some items may interact with your medicine. What should I watch for while using this medicine? Visit your doctor for checks on your progress. This drug may make you feel generally unwell. This is not uncommon, as chemotherapy can affect healthy cells as well as cancer cells. Report any side effects. Continue your course of treatment even though you feel ill unless your doctor tells you to stop. Drink water or other fluids as directed. Urinate often, even at night. In some cases, you may be given additional medicines to help with side effects. Follow all directions for their use. Call your doctor or  health care professional for advice if you get a fever, chills or sore throat, or other symptoms of a cold or flu. Do not treat yourself. This drug decreases your body's ability to fight infections. Try to avoid being around people who are sick. This medicine may increase your risk to bruise or bleed. Call your doctor or health care professional if you notice any unusual bleeding. Be careful brushing and flossing your teeth or using a toothpick because you may get an infection or bleed more easily. If you have any dental work done, tell your dentist you are receiving this medicine. You may get drowsy or dizzy. Do not  drive, use machinery, or do anything that needs mental alertness until you know how this medicine affects you. Do not become pregnant while taking this medicine or for 1 year after stopping it. Women should inform their doctor if they wish to become pregnant or think they might be pregnant. Men should not father a child while taking this medicine and for 4 months after stopping it. There is a potential for serious side effects to an unborn child. Talk to your health care professional or pharmacist for more information. Do not breast-feed an infant while taking this medicine. This medicine may interfere with the ability to have a child. This medicine has caused ovarian failure in some women. This medicine has caused reduced sperm counts in some men. You should talk with your doctor or health care professional if you are concerned about your fertility. If you are going to have surgery, tell your doctor or health care professional that you have taken this medicine. What side effects may I notice from receiving this medicine? Side effects that you should report to your doctor or health care professional as soon as possible:  allergic reactions like skin rash, itching or hives, swelling of the face, lips, or tongue  low blood counts - this medicine may decrease the number of white blood cells, red blood cells and platelets. You may be at increased risk for infections and bleeding.  signs of infection - fever or chills, cough, sore throat, pain or difficulty passing urine  signs of decreased platelets or bleeding - bruising, pinpoint red spots on the skin, black, tarry stools, blood in the urine  signs of decreased red blood cells - unusually weak or tired, fainting spells, lightheadedness  breathing problems  dark urine  dizziness  palpitations  swelling of the ankles, feet, hands  trouble passing urine or change in the amount of urine  weight gain  yellowing of the eyes or skin Side  effects that usually do not require medical attention (report to your doctor or health care professional if they continue or are bothersome):  changes in nail or skin color  hair loss  missed menstrual periods  mouth sores  nausea, vomiting This list may not describe all possible side effects. Call your doctor for medical advice about side effects. You may report side effects to FDA at 1-800-FDA-1088. Where should I keep my medicine? This drug is given in a hospital or clinic and will not be stored at home. NOTE: This sheet is a summary. It may not cover all possible information. If you have questions about this medicine, talk to your doctor, pharmacist, or health care provider.  2020 Elsevier/Gold Standard (2012-02-19 16:22:58)

## 2019-01-13 NOTE — Assessment & Plan Note (Signed)
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 Genetic counseling: Normal did not reveal any abnormal mutations 2. Neoadjuvant chemotherapy. [facility] 6 cycles followed by Herceptin And Perjetamaintenance completed 10/20/2016) from 12/31/15 to 04/15/16 3. Right mastectomy with reconstruction: 05/19/2016:Right mastectomy: IDC grade 3, 1.3 cm, low-grade DCIS, LVIDS present, margins negative, 0/1 lymph node negative, [id number] stage IA; repeat HER-2 negative ratio 1.41 4. Adjuvant antiestrogen therapy With letrozole 2.5 mg daily started March 2018 -------------------------------------------------------------------------------------------------------------------------------------------------- Breast cancer: Subcutaneous disease: 1.8 cm measured by ultrasound Biopsy revealed grade 2-3 IDC ER 90% PR 0% HER-2 negative Ki-67 40% 11/30/2018 right lumpectomy Patricia Chandler): IDC, grade 3, 2.1cm, perineural invasion present, carcinoma present focally at the posterior margin and involving skeletal muscle. Oncotype score 52: Greater than 39% risk of distant recurrence with hormone therapy alone, chemo benefit greater than 15%  Recommendation: 1.Surgery to remove the tumor8/03/2019 2.Adjuvant chemotherapy withTaxotere and Cytoxan x6 cycles started 12/30/2018 3.Adjuvant radiation 4.Follow-up adjuvant antiestrogen therapy ------------------------------------------------------------------------------------------------------------------------------------ Current treatment: Cycle 2-day 1 Taxotere and Cytoxan Labs  reviewed   Chemo toxicities: 1.  Mild body aches 2.  Fatigue 3.  Mild nausea  Return to clinic in  3 weeks for cycle 3

## 2019-01-14 ENCOUNTER — Ambulatory Visit: Payer: Medicare Other

## 2019-01-16 ENCOUNTER — Telehealth: Payer: Self-pay | Admitting: Hematology and Oncology

## 2019-01-16 NOTE — Telephone Encounter (Signed)
I talk with patient regarding schedule  

## 2019-01-18 ENCOUNTER — Ambulatory Visit: Payer: Medicare Other | Admitting: Psychology

## 2019-01-18 NOTE — Progress Notes (Signed)
Medical screening examination/treatment/procedure(s) were performed by non-physician practitioner and as supervising physician I was immediately available for consultation/collaboration. I agree with above. Cornesha Radziewicz, MD   

## 2019-01-23 ENCOUNTER — Other Ambulatory Visit: Payer: Self-pay | Admitting: Hematology and Oncology

## 2019-01-23 MED ORDER — ZOLPIDEM TARTRATE 5 MG PO TABS: 2.5000 mg | ORAL_TABLET | Freq: Every evening | ORAL | 3 refills | Status: DC | PRN

## 2019-01-24 ENCOUNTER — Telehealth: Payer: Self-pay | Admitting: *Deleted

## 2019-01-24 NOTE — Telephone Encounter (Signed)
Received call from pt stating since her last tx on 9/25 she has been experiencing dizziness, fatigue, and increased heart rate.  Pt states her heart rate is in the 90's and typically runs in the 60's and also experiences dizziness when standing. Pt states she is only drinking around 36 oz of water a day and has no appetite for food.  RN instructed pt that her symptoms are related to dehydration and offered to bring the pt in to the [facility] clinic for evaluation and possible IV hydration.  Pt declined to come in and be evaluated at this time.  RN instructed pt on drinking at least 64 oz of water/ fluids such as Gatorade a day.  Also encouraged pt to call the clinic if symptoms do not improve.  Pt verbalized understanding and appreciative of the call.

## 2019-01-25 ENCOUNTER — Other Ambulatory Visit: Payer: Self-pay

## 2019-01-25 ENCOUNTER — Inpatient Hospital Stay (HOSPITAL_BASED_OUTPATIENT_CLINIC_OR_DEPARTMENT_OTHER): Payer: Medicare Other | Admitting: Medical

## 2019-01-25 ENCOUNTER — Encounter: Payer: Self-pay | Admitting: *Deleted

## 2019-01-25 ENCOUNTER — Telehealth: Payer: Self-pay | Admitting: *Deleted

## 2019-01-25 ENCOUNTER — Telehealth: Payer: Self-pay | Admitting: Hematology and Oncology

## 2019-01-25 ENCOUNTER — Inpatient Hospital Stay: Payer: Medicare Other | Attending: Hematology and Oncology

## 2019-01-25 ENCOUNTER — Other Ambulatory Visit: Payer: Self-pay | Admitting: Emergency Medicine

## 2019-01-25 VITALS — BP 122/93 | HR 117 | Temp 98.5°F | Resp 18 | Ht 63.0 in | Wt 148.3 lb

## 2019-01-25 DIAGNOSIS — R221 Localized swelling, mass and lump, neck: Secondary | ICD-10-CM | POA: Insufficient documentation

## 2019-01-25 DIAGNOSIS — Z5189 Encounter for other specified aftercare: Secondary | ICD-10-CM | POA: Insufficient documentation

## 2019-01-25 DIAGNOSIS — R000 Tachycardia, unspecified: Secondary | ICD-10-CM | POA: Insufficient documentation

## 2019-01-25 DIAGNOSIS — Z170 Estrogen receptor positive status [ER+]: Secondary | ICD-10-CM

## 2019-01-25 DIAGNOSIS — C50211 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 female breast: Secondary | ICD-10-CM

## 2019-01-25 DIAGNOSIS — I82B11 Acute embolism and thrombosis of right subclavian vein: Secondary | ICD-10-CM | POA: Diagnosis not present

## 2019-01-25 DIAGNOSIS — Z452 Encounter for adjustment and management of vascular access device: Secondary | ICD-10-CM | POA: Diagnosis not present

## 2019-01-25 DIAGNOSIS — I82C11 Acute embolism and thrombosis of right internal jugular vein: Secondary | ICD-10-CM | POA: Diagnosis not present

## 2019-01-25 DIAGNOSIS — Z5111 Encounter for antineoplastic chemotherapy: Secondary | ICD-10-CM | POA: Diagnosis not present

## 2019-01-25 DIAGNOSIS — Z87898 Personal history of other specified conditions: Secondary | ICD-10-CM

## 2019-01-25 LAB — CBC WITH DIFFERENTIAL (CANCER CENTER ONLY)
Abs Immature Granulocytes: 3.15 10*3/uL — ABNORMAL HIGH (ref 0.00–0.07)
Basophils Absolute: 0 10*3/uL (ref 0.0–0.1)
Basophils Relative: 0 %
Eosinophils Absolute: 0 10*3/uL (ref 0.0–0.5)
Eosinophils Relative: 0 %
HCT: 34.4 % — ABNORMAL LOW (ref 36.0–46.0)
Hemoglobin: 11.7 g/dL — ABNORMAL LOW (ref 12.0–15.0)
Immature Granulocytes: 12 %
Lymphocytes Relative: 5 %
Lymphs Abs: 1.3 10*3/uL (ref 0.7–4.0)
MCH: 29.5 pg (ref 26.0–34.0)
MCHC: 34 g/dL (ref 30.0–36.0)
MCV: 86.6 fL (ref 80.0–100.0)
Monocytes Absolute: 1 10*3/uL (ref 0.1–1.0)
Monocytes Relative: 4 %
Neutro Abs: 20.1 10*3/uL — ABNORMAL HIGH (ref 1.7–7.7)
Neutrophils Relative %: 79 %
Platelet Count: 199 10*3/uL (ref 150–400)
RBC: 3.97 MIL/uL (ref 3.87–5.11)
RDW: 14.2 % (ref 11.5–15.5)
WBC Count: 25.6 10*3/uL — ABNORMAL HIGH (ref 4.0–10.5)
nRBC: 0.1 % (ref 0.0–0.2)

## 2019-01-25 LAB — CMP (CANCER CENTER ONLY)
ALT: 26 U/L (ref 0–44)
AST: 20 U/L (ref 15–41)
Albumin: 3.8 g/dL (ref 3.5–5.0)
Alkaline Phosphatase: 126 U/L (ref 38–126)
Anion gap: 10 (ref 5–15)
BUN: 13 mg/dL (ref 8–23)
CO2: 25 mmol/L (ref 22–32)
Calcium: 9.2 mg/dL (ref 8.9–10.3)
Chloride: 102 mmol/L (ref 98–111)
Creatinine: 0.82 mg/dL (ref 0.44–1.00)
GFR, Est AFR Am: >60 mL/min (ref >60)
GFR, Estimated: >60 mL/min (ref >60)
Glucose, Bld: 115 mg/dL — ABNORMAL HIGH (ref 70–99)
Potassium: 3.9 mmol/L (ref 3.5–5.1)
Sodium: 137 mmol/L (ref 135–145)
Total Bilirubin: 0.3 mg/dL (ref 0.3–1.2)
Total Protein: 6.7 g/dL (ref 6.5–8.1)

## 2019-01-25 LAB — SAMPLE TO BLOOD BANK

## 2019-01-25 MED ORDER — SODIUM CHLORIDE 0.9 % IV SOLN
Freq: Once | INTRAVENOUS | Status: DC
  Filled 2019-01-25: qty 250

## 2019-01-25 NOTE — Progress Notes (Signed)
Pt presents with tenderness, edema, and warmth on R side neck around internal portion of chest portacath.  Swollen area not hardened or bleeding/draining.  Pt denies recent injury.  Port placed 2 months ago without any reported issues.  Afebrile.  Denies CP, SOB, trouble swallowing, or neurological/cardiac changes from baseline.  No redness or injury visible.  Waiting for return call from surgeon to assess port.  Attempted PIV placement in [facility] for IVF to tx dehydration per PA Lucianne Lei, unable to gain access.  Pt states she would prefer to wait for the surgeon to assess her port and determine if it is safe to use before attempting any more IV hydration.  PA Lucianne Lei aware and agrees.  Pt pushing oral hydration at this time.  Pt requested to return tomorrow to clinic to be assessed by surgeon, no f/u call w/in 45 minutes of original consult request.  Ok per PA Lucianne Lei.  Amb to exit with belongings, aware of d/c instructions to f/u as needed for any changes/exacerbations and to wait for call from surgeon's office to schedule consult tomorrow.  PA Lucianne Lei spoke with Dr. [name] (surgeon) who states that pt can return tomorrow for scans/assessment of port site as the patient has already gone home and that he feels she is stable enough to wait until tomorrow.  PA Lucianne Lei contacted patient to alert her to scheduled [place] & CXR tomorrow, pt verbalized understanding of appts date/times and denies any further concerns at this time.

## 2019-01-25 NOTE — Telephone Encounter (Signed)
Received call from pt stating she is experiencing swelling in her neck and tenderness around her port. Pt denies any recent trauma or heavy lifting.  Pt also states she is still experiencing symptoms of dehydration from our conversation yesterday including dizziness with standing and decreased oral intake.  Pt requesting to be seen in [facility].  Learta Codding, RN notified and high priority message sent to scheduling to schedule pt for labs and [facility] today at 12:30.

## 2019-01-25 NOTE — Progress Notes (Signed)
[facility] Progress Note   Patricia Chandler [id number] Jun 03, 1955 63 y.o.  Patricia Chandler is managed by Dr. Nicholas Lose  Actively treated with chemotherapy/immunotherapy/hormonal therapy: yes  Current therapy:  Cytoxan and Taxotere with pegfilgrastim support  Last treated: 01/13/2019 (cycle 2, day 1)  Next scheduled appointment with provider:  02/03/2019  Assessment: Plan:    History of neck swelling - Plan: DG Chest 2 View, VAS [place] UPPER EXTREMITY VENOUS DUPLEX  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 breast in female, estrogen receptor positive ([place])  Tachycardia - Plan: 0.9 %  sodium chloride infusion, DG Chest 2 View, VAS [place] UPPER EXTREMITY VENOUS DUPLEX   History of right neck swelling and tachycardia: An attempt was made to start a peripheral IV so that the patient could receive 1 L of normal saline IV.  Unfortunately her IV site blew.  She requested that no second attempt be made.  She will have a ultrasound of the right upper extremity tomorrow and will have a chest x-ray to assess placement of her right chest wall Port-A-Cath.  Dr. Serita Grammes will be contacted with these results when they are available.  And  Recurrent ER positive malignant neoplasm of the right breast: The patient is status post cycle 2, day 1 of Cytoxan and Taxotere with pegfilgrastim support which was dosed on 01/13/2019.  She is scheduled to see Dr. Nicholas Lose in follow-up on 02/03/2019.  Please see After Visit Summary for patient specific instructions.  Future Appointments  Date Time Provider [place]  01/26/2019 10:00 AM [facility] VASC US 1-[place] [facility]  02/03/2019  9:00 AM [facility] LAB 2 [facility] None  02/03/2019  9:15 AM [facility] [place] FLUSH [facility] None  02/03/2019  9:45 AM Nicholas Lose, MD [facility] None  02/03/2019 10:30 AM [facility] INFUSION [facility] None  02/08/2019 10:00 AM Natividad Brood, LCSW [facility]  None  02/14/2019 10:00 AM Natividad Brood, LCSW [facility] None  02/22/2019 10:00 AM Natividad Brood, LCSW [facility] None  02/24/2019 10:00 AM [facility] LAB 6 [facility] None  02/24/2019 10:15 AM [facility] [place] FLUSH [facility] None  02/24/2019 10:45 AM Nicholas Lose, MD [facility] None  02/24/2019 11:30 AM [facility] INFUSION [facility] None  03/02/2019 10:00 AM [facility] ECHO OP 1 [facility]  03/02/2019 11:00 AM Bensimhon, Shaune Pascal, MD [facility] None  03/17/2019  9:00 AM [facility] LAB 3 [facility] None  03/17/2019  9:15 AM [facility] [place] FLUSH [facility] None  03/17/2019  9:45 AM Nicholas Lose, MD [facility] None  03/17/2019 10:30 AM [facility] INFUSION [facility] None  04/07/2019 10:15 AM [facility] LAB 2 [facility] None  04/07/2019 10:30 AM [facility] [place] FLUSH [facility] None  04/07/2019 11:00 AM Nicholas Lose, MD [facility] None  04/07/2019 12:00 PM [facility] INFUSION [facility] None  10/24/2019  9:30 AM Nicholas Lose, MD [facility] None    Orders Placed This Encounter  Procedures   DG Chest 2 View   VAS [place] UPPER EXTREMITY VENOUS DUPLEX       Subjective:   Patient ID:  Patricia Chandler is a 63 y.o. (DOB October 31, 1955) female.  Chief Complaint:  Chief Complaint  Patient presents with   Vascular Access Problem    HPI Patricia Chandler  Is a 63 y.o. female with a diagnosis of a recurrent ER positive malignant neoplasm of the right breast. She is status post cycle 2, day 1 of Cytoxan and Taxotere with pegfilgrastim support which was dosed on 01/13/2019.  She presents today with report that her pulse has  been higher.  She has been checking it at home and has noted that it has been in the upper 90s.  She also reports fatigue and anorexia.  She has been pushing fluids at home.  She has noted over the last several days that she has a swollen and tender area in her right neck where her Port-A-Cath catheter bends into her internal jugular vein.  She has had no  trouble or changes in activity.  She denies fevers, chills, or sweats.  Medications: I have reviewed the patient's current medications.  Allergies: No Known Allergies  Past Medical History:  Diagnosis Date   Anemia    due to chemo   Anxiety    Breast cancer ([place]) dx'd 2017   right   Cardiomyopathy ([place])    mild, per cardiology note; due to Herceptin   Dental crowns present    Family history of adverse reaction to anesthesia    pt's daughter has hx. of post-op nausea   GERD (gastroesophageal reflux disease)    Heart murmur    History of breast cancer 2017   right   History of chemotherapy    finished chemo 04/15/2016   Personal history of chemotherapy    Rash 08/04/2016   under right arm - states recent tick bite; to see MD 08/05/2016   Recurrent breast cancer, right ([place]) dx'd 10/2018   Runny nose 06/06/2016   clear drainage, per pt.    Past Surgical History:  Procedure Laterality Date   AUGMENTATION MAMMAPLASTY     BREAST BIOPSY Right 11/30/2018   Procedure: RIGHT BREAST CHEST WALL RECURRENCE EXCISION;  Surgeon: Rolm Bookbinder, MD;  Location: [facility];  Service: General;  Laterality: Right;   BREAST ENHANCEMENT SURGERY Bilateral 2005   breast mass removal Right 11/30/2018   BREAST RECONSTRUCTION WITH PLACEMENT OF TISSUE EXPANDER AND FLEX HD (ACELLULAR HYDRATED DERMIS) Right 05/19/2016   Procedure: RIGHT BREAST RECONSTRUCTION WITH PLACEMENT OF TISSUE EXPANDER AND  ALLODERM.;  Surgeon: Irene Limbo, MD;  Location: [place];  Service: Plastics;  Laterality: Right;   COLONOSCOPY     COLONOSCOPY WITH PROPOFOL  10/02/2014   DEBRIDEMENT AND CLOSURE WOUND Right 06/08/2016   Procedure: DEBRIDEMENT RIGHT MASTECTOMY FLAP, TISSUE EXPANSION OF RIGHT CHEST;  Surgeon: Irene Limbo, MD;  Location: [place];  Service: Plastics;  Laterality: Right;   MASTECTOMY     MASTOPEXY Left 08/11/2016   Procedure: LEFT BREAST MASTOPEXY;  Surgeon: Irene Limbo, MD;  Location: [facility] [place];  Service: Plastics;  Laterality: Left;   NIPPLE SPARING MASTECTOMY/SENTINAL LYMPH NODE BIOPSY/RECONSTRUCTION/PLACEMENT OF TISSUE EXPANDER Right 05/19/2016   Procedure: RIGHT NIPPLE SPARING MASTECTOMY WITH SENTINAL LYMPH NODE BIOPSY WITH BLUE DYE INJECTION;  Surgeon: Rolm Bookbinder, MD;  Location: [facility];  Service: General;  Laterality: Right;   PORTACATH PLACEMENT N/A 12/30/2015   Procedure: INSERTION PORT-A-CATH WITH [place];  Surgeon: Rolm Bookbinder, MD;  Location: [place];  Service: General;  Laterality: N/A;   PORTACATH PLACEMENT N/A 11/30/2018   Procedure: INSERTION PORT-A-CATH WITH ULTRASOUND;  Surgeon: Rolm Bookbinder, MD;  Location: [place];  Service: General;  Laterality: N/A;   REDUCTION MAMMAPLASTY     REMOVAL OF TISSUE EXPANDER AND PLACEMENT OF IMPLANT Right 08/11/2016   Procedure: REMOVAL OF RIGHT  TISSUE EXPANDER AND PLACEMENT OF IMPLANT;  Surgeon: Irene Limbo, MD;  Location: [facility];  Service: Plastics;  Laterality: Right;    Family History  Problem Relation Age of Onset  Breast cancer Maternal Aunt        dx in her 42s   Stroke Father        late 34s   Hypertension Father    Pulmonary fibrosis Mother    Breast cancer Paternal Aunt 80   Cancer Paternal Grandmother        abdominal; cervical vs uterine dx in her 86s   Stomach cancer Maternal Grandmother    Parkinson's disease Maternal Grandfather    Anesthesia problems Daughter        post-op nausea   Colon cancer Neg Hx    Esophageal cancer Neg Hx    Rectal cancer Neg Hx     Social History   Socioeconomic History   Marital status: Married    Spouse name: Not on file   Number of children: 2   Years of education: Not on file   Highest education level: Not on file  Occupational History   Occupation: retired  Scientist, product/process development strain: Not on file   Food  insecurity    Worry: Not on file    Inability: Not on Lexicographer needs    Medical: Not on file    Non-medical: Not on file  Tobacco Use   Smoking status: Never Smoker   Smokeless tobacco: Never Used  Substance and Sexual Activity   Alcohol use: Yes    Comment: occasionally   Drug use: No   Sexual activity: Not on file  Lifestyle   Physical activity    Days per week: Not on file    Minutes per session: Not on file   Stress: Not on file  Relationships   Social connections    Talks on phone: Not on file    Gets together: Not on file    Attends religious service: Not on file    Active member of club or organization: Not on file    Attends meetings of clubs or organizations: Not on file    Relationship status: Not on file   Intimate partner violence    Fear of current or ex partner: Not on file    Emotionally abused: Not on file    Physically abused: Not on file    Forced sexual activity: Not on file  Other Topics Concern   Not on file  Social History Narrative   Married 1978 (Al). 2 girls 33 and 36 in 2018. [name] lives in [place], [place] with 63 yo son (single right now). and [name] in [place], [place]--> now [place] with husband and 63 year old daughter (born with heart defect with missing valve and initially not closed VSD--still weighs heavily on family- likely will have to be repaired).       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    Graduated from [employer]. [employer] prior.       Hobbies: gardening, decorating, sewing, enjoys creating    Past Medical History, Surgical history, Social history, and Family history were reviewed and updated as appropriate.   Please see review of systems for further details on the patient's review from today.   Review of Systems:  Review of Systems  Constitutional: Positive for appetite change and fatigue. Negative for chills,  diaphoresis, fever and unexpected weight change.  HENT: Negative for trouble swallowing.   Respiratory: Negative for cough, chest tightness and shortness of breath.   Cardiovascular: Negative for chest pain  and palpitations.  Gastrointestinal: Negative for abdominal pain, constipation, diarrhea, nausea and vomiting.  Skin:       Swollen and tender area in the right neck at the site clear her right chest wall Port-A-Cath bends and goes into the right internal jugular vein.  Neurological: Negative for dizziness and headaches.    Objective:   Physical Exam:  BP (!) 122/93 (BP Location: Left Arm, Patient Position: Sitting)    Pulse (!) 117    Temp 98.5 F (36.9 C) (Temporal)    Resp 18    Ht 5\' 3"  (1.6 m)    Wt 148 lb 4.8 oz (67.3 kg)    SpO2 97%    BMI 26.27 kg/m  ECOG: 0  Physical Exam Constitutional:      General: She is not in acute distress.    Appearance: She is not diaphoretic.  HENT:     Head: Normocephalic and atraumatic.  Neck:     Musculoskeletal: Neck supple. Muscular tenderness present.   Cardiovascular:     Rate and Rhythm: Normal rate and regular rhythm.     Heart sounds: Normal heart sounds. No murmur. No friction rub. No gallop.   Pulmonary:     Effort: Pulmonary effort is normal. No respiratory distress.     Breath sounds: Normal breath sounds. No wheezing or rales.  Lymphadenopathy:     Cervical: No cervical adenopathy.  Skin:    General: Skin is warm and dry.     Findings: No erythema or rash.  Neurological:     Mental Status: She is alert.        Lab Review:     Component Value Date/Time   NA 137 01/25/2019 1230   NA 140 10/20/2016 0757   K 3.9 01/25/2019 1230   K 3.9 10/20/2016 0757   CL 102 01/25/2019 1230   CO2 25 01/25/2019 1230   CO2 24 10/20/2016 0757   GLUCOSE 115 (H) 01/25/2019 1230   GLUCOSE 111 10/20/2016 0757   BUN 13 01/25/2019 1230   BUN 22.7 10/20/2016 0757   CREATININE 0.82 01/25/2019 1230   CREATININE 0.8 10/20/2016 0757    CALCIUM 9.2 01/25/2019 1230   CALCIUM 9.3 10/20/2016 0757   PROT 6.7 01/25/2019 1230   PROT 5.9 (L) 10/20/2016 0757   ALBUMIN 3.8 01/25/2019 1230   ALBUMIN 3.6 10/20/2016 0757   AST 20 01/25/2019 1230   AST 16 10/20/2016 0757   ALT 26 01/25/2019 1230   ALT 16 10/20/2016 0757   ALKPHOS 126 01/25/2019 1230   ALKPHOS 54 10/20/2016 0757   BILITOT 0.3 01/25/2019 1230   BILITOT 0.39 10/20/2016 0757   GFRNONAA >60 01/25/2019 1230   GFRAA >60 01/25/2019 1230       Component Value Date/Time   WBC 25.6 (H) 01/25/2019 1230   WBC 9.6 11/18/2018 0940   RBC 3.97 01/25/2019 1230   HGB 11.7 (L) 01/25/2019 1230   HGB 11.3 (L) 10/20/2016 0757   HCT 34.4 (L) 01/25/2019 1230   HCT 32.9 (L) 10/20/2016 0757   PLT 199 01/25/2019 1230   PLT 186 10/20/2016 0757   MCV 86.6 01/25/2019 1230   MCV 89.2 10/20/2016 0757   MCH 29.5 01/25/2019 1230   MCHC 34.0 01/25/2019 1230   RDW 14.2 01/25/2019 1230   RDW 14.2 10/20/2016 0757   LYMPHSABS 1.3 01/25/2019 1230   LYMPHSABS 2.0 10/20/2016 0757   MONOABS 1.0 01/25/2019 1230   MONOABS 0.4 10/20/2016 0757   EOSABS 0.0 01/25/2019 1230  EOSABS 0.1 10/20/2016 0757   BASOSABS 0.0 01/25/2019 1230   BASOSABS 0.0 10/20/2016 0757   -------------------------------  Imaging from last 24 hours (if applicable):  Radiology interpretation: No results found.      This case was discussed with Dr. Serita Grammes who is the patient's surgeon.Marland Kitchen He expresses agreement with my management of this patient.

## 2019-01-25 NOTE — Telephone Encounter (Signed)
Please schedule pt for Labs today at 1230pm and [facility] at 1:00 pm. Learta Codding RN aware and pt aware of times for today.

## 2019-01-25 NOTE — Patient Instructions (Signed)

## 2019-01-26 ENCOUNTER — Ambulatory Visit (HOSPITAL_COMMUNITY)
Admission: RE | Admit: 2019-01-26 | Discharge: 2019-01-26 | Disposition: A | Discharge status: Home / Self Care | Payer: Medicare Other | Source: Ambulatory Visit | Attending: Medical | Admitting: Medical

## 2019-01-26 ENCOUNTER — Other Ambulatory Visit: Payer: Self-pay

## 2019-01-26 ENCOUNTER — Encounter: Payer: Self-pay | Admitting: Medical

## 2019-01-26 ENCOUNTER — Telehealth: Payer: Self-pay | Admitting: Medical

## 2019-01-26 ENCOUNTER — Inpatient Hospital Stay: Payer: Medicare Other | Admitting: Medical

## 2019-01-26 VITALS — BP 121/95 | HR 115 | Temp 98.3°F | Resp 17 | Ht 63.0 in | Wt 147.8 lb

## 2019-01-26 DIAGNOSIS — Z87898 Personal history of other specified conditions: Secondary | ICD-10-CM | POA: Diagnosis not present

## 2019-01-26 DIAGNOSIS — O2230 Deep phlebothrombosis in pregnancy, unspecified trimester: Secondary | ICD-10-CM

## 2019-01-26 DIAGNOSIS — R000 Tachycardia, unspecified: Secondary | ICD-10-CM | POA: Diagnosis not present

## 2019-01-26 DIAGNOSIS — C50919 Malignant neoplasm of unspecified site of unspecified female breast: Secondary | ICD-10-CM | POA: Diagnosis not present

## 2019-01-26 DIAGNOSIS — Z95828 Presence of other vascular implants and grafts: Secondary | ICD-10-CM

## 2019-01-26 DIAGNOSIS — Z5189 Encounter for other specified aftercare: Secondary | ICD-10-CM | POA: Diagnosis not present

## 2019-01-26 DIAGNOSIS — I82B11 Acute embolism and thrombosis of right subclavian vein: Secondary | ICD-10-CM | POA: Diagnosis not present

## 2019-01-26 DIAGNOSIS — R221 Localized swelling, mass and lump, neck: Secondary | ICD-10-CM | POA: Diagnosis not present

## 2019-01-26 DIAGNOSIS — Z5111 Encounter for antineoplastic chemotherapy: Secondary | ICD-10-CM | POA: Diagnosis not present

## 2019-01-26 DIAGNOSIS — Z170 Estrogen receptor positive status [ER+]: Secondary | ICD-10-CM | POA: Diagnosis not present

## 2019-01-26 DIAGNOSIS — I82C11 Acute embolism and thrombosis of right internal jugular vein: Secondary | ICD-10-CM | POA: Diagnosis not present

## 2019-01-26 DIAGNOSIS — C50211 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 female breast: Secondary | ICD-10-CM | POA: Diagnosis not present

## 2019-01-26 DIAGNOSIS — I8290 Acute embolism and thrombosis of unspecified vein: Secondary | ICD-10-CM

## 2019-01-26 DIAGNOSIS — Z452 Encounter for adjustment and management of vascular access device: Secondary | ICD-10-CM | POA: Diagnosis not present

## 2019-01-26 MED ORDER — RIVAROXABAN (XARELTO) VTE STARTER PACK (15 & 20 MG): ORAL_TABLET | ORAL | 0 refills | Status: DC

## 2019-01-26 MED ORDER — RIVAROXABAN 20 MG PO TABS: 20.0000 mg | ORAL_TABLET | Freq: Every day | ORAL | 4 refills | Status: DC

## 2019-01-26 MED ORDER — HEPARIN SOD (PORK) LOCK FLUSH 100 UNIT/ML IV SOLN
500.0000 [IU] | Freq: Once | INTRAVENOUS | Status: AC
  Administered 2019-01-26: 500 [IU]
  Filled 2019-01-26: qty 5

## 2019-01-26 MED ORDER — SODIUM CHLORIDE 0.9% FLUSH
10.0000 mL | Freq: Once | INTRAVENOUS | Status: AC
  Administered 2019-01-26: 10 mL
  Filled 2019-01-26: qty 10

## 2019-01-26 NOTE — Progress Notes (Signed)
The patient had been seen yesterday for swelling in her right lateral neck.  She was referred for an ultrasound of her right upper extremity which returned showing:  Right: No evidence of superficial vein thrombosis in the upper extremity. Findings consistent with acute deep vein thrombosis involving the right internal jugular vein and right subclavian vein.   Left: No evidence of thrombosis in the subclavian.  Chest x-ray was completed with results returning showing:  The heart size and mediastinal contours are within normal limits. No pneumothorax or pleural effusion is noted. Right internal jugular Port-A-Cath is noted. Both lungs are clear. The visualized skeletal structures are unremarkable.  The patient's port was accessed.  The port was flushed and demonstrated good blood return.  She was placed on Xarelto and was given a prescription for Xarelto starter monitor. She will take her first dose on her return home this afternoon.  Sandi Mealy, MHS, PA-C Physician Assistant

## 2019-01-26 NOTE — Progress Notes (Signed)
VASCULAR LAB PRELIMINARY  PRELIMINARY  PRELIMINARY  PRELIMINARY  Right upper extremity venous duplex completed.    Preliminary report:  See CV proc for preliminary results.  Called report to Sandi Mealy, PA-C  Lean Fayson, RVT 01/26/2019, 10:32 AM

## 2019-01-26 NOTE — Progress Notes (Signed)
Chest port accessed per PA Van VO.  Pt tolerated well.  Blood return achieved, flushes well.  PA Lucianne Lei aware, ok to deaccess pt at this time.  Pt declines needing IVF but is aware she can call [facility] if she changes her mind/starts feeling weaker.  Pt denies any questions/concerns at time of d/c, verbalizes understanding of d/c and f/u instructions.

## 2019-01-26 NOTE — Telephone Encounter (Signed)
Scheduled per MD request.

## 2019-01-26 NOTE — Patient Instructions (Signed)
COVID-19 Frequently Asked Questions °COVID-19 (coronavirus disease) is an infection that is caused by a large family of viruses. Some viruses cause illness in people and others cause illness in animals like camels, cats, and bats. In some cases, the viruses that cause illness in animals can spread to humans. °Where did the coronavirus come from? °In December 2019, [place] told the World Health Organization (WHO) of several cases of lung disease (human respiratory illness). These cases were linked to an open seafood and livestock market in the city of [place]. The link to the seafood and livestock market suggests that the virus may have spread from animals to humans. However, since that first outbreak in December, the virus has also been shown to spread from person to person. °What is the name of the disease and the virus? °Disease name °Early on, this disease was called novel coronavirus. This is because scientists determined that the disease was caused by a new (novel) respiratory virus. The World Health Organization (WHO) has now named the disease COVID-19, or coronavirus disease. °Virus name °The virus that causes the disease is called severe acute respiratory syndrome coronavirus 2 (SARS-CoV-2). °More information on disease and virus naming °World Health Organization (WHO): www.who.int/emergencies/diseases/novel-coronavirus-2019/technical-guidance/naming-the-coronavirus-disease-(covid-2019)-and-the-virus-that-causes-it °Who is at risk for complications from coronavirus disease? °Some people may be at higher risk for complications from coronavirus disease. This includes older adults and people who have chronic diseases, such as heart disease, diabetes, and lung disease. °If you are at higher risk for complications, take these extra precautions: °· Avoid close contact with people who are sick or have a fever or cough. Stay at least 3-6 ft (1-2 m) away from them, if possible. °· Wash your hands often with soap and  water for at least 20 seconds. °· Avoid touching your face, mouth, nose, or eyes. °· Keep supplies on hand at home, such as food, medicine, and cleaning supplies. °· Stay home as much as possible. °· Avoid social gatherings and travel. °How does coronavirus disease spread? °The virus that causes coronavirus disease spreads easily from person to person (is contagious). There are also cases of community-spread disease. This means the disease has spread to: °· People who have no known contact with other infected people. °· People who have not traveled to areas where there are known cases. °It appears to spread from one person to another through droplets from coughing or sneezing. °Can I get the virus from touching surfaces or objects? °There is still a lot that we do not know about the virus that causes coronavirus disease. Scientists are basing a lot of information on what they know about similar viruses, such as: °· Viruses cannot generally survive on surfaces for long. They need a human body (host) to survive. °· It is more likely that the virus is spread by close contact with people who are sick (direct contact), such as through: °? Shaking hands or hugging. °? Breathing in respiratory droplets that travel through the air. This can happen when an infected person coughs or sneezes on or near other people. °· It is less likely that the virus is spread when a person touches a surface or object that has the virus on it (indirect contact). The virus may be able to enter the body if the person touches a surface or object and then touches his or her face, eyes, nose, or mouth. °Can a person spread the virus without having symptoms of the disease? °It may be possible for the virus to spread before a person   has symptoms of the disease, but this is most likely not the main way the virus is spreading. It is more likely for the virus to spread by being in close contact with people who are sick and breathing in the respiratory  droplets of a sick person's cough or sneeze. °What are the symptoms of coronavirus disease? °Symptoms vary from person to person and can range from mild to severe. Symptoms may include: °· Fever. °· Cough. °· Tiredness, weakness, or fatigue. °· Fast breathing or feeling short of breath. °These symptoms can appear anywhere from 2 to 14 days after you have been exposed to the virus. If you develop symptoms, call your health care provider. People with severe symptoms may need hospital care. °If I am exposed to the virus, how long does it take before symptoms start? °Symptoms of coronavirus disease may appear anywhere from 2 to 14 days after a person has been exposed to the virus. If you develop symptoms, call your health care provider. °Should I be tested for this virus? °Your health care provider will decide whether to test you based on your symptoms, history of exposure, and your risk factors. °How does a health care provider test for this virus? °Health care providers will collect samples to send for testing. Samples may include: °· Taking a swab of fluid from the nose. °· Taking fluid from the lungs by having you cough up mucus (sputum) into a sterile cup. °· Taking a blood sample. °· Taking a stool or urine sample. °Is there a treatment or vaccine for this virus? °Currently, there is no vaccine to prevent coronavirus disease. Also, there are no medicines like antibiotics or antivirals to treat the virus. A person who becomes sick is given supportive care, which means rest and fluids. A person may also relieve his or her symptoms by using over-the-counter medicines that treat sneezing, coughing, and runny nose. These are the same medicines that a person takes for the common cold. °If you develop symptoms, call your health care provider. People with severe symptoms may need hospital care. °What can I do to protect myself and my family from this virus? ° °  ° °You can protect yourself and your family by taking the  same actions that you would take to prevent the spread of other viruses. Take the following actions: °· Wash your hands often with soap and water for at least 20 seconds. If soap and water are not available, use alcohol-based hand sanitizer. °· Avoid touching your face, mouth, nose, or eyes. °· Cough or sneeze into a tissue, sleeve, or elbow. Do not cough or sneeze into your hand or the air. °? If you cough or sneeze into a tissue, throw it away immediately and wash your hands. °· Disinfect objects and surfaces that you frequently touch every day. °· Avoid close contact with people who are sick or have a fever or cough. Stay at least 3-6 ft (1-2 m) away from them, if possible. °· Stay home if you are sick, except to get medical care. Call your health care provider before you get medical care. °· Make sure your vaccines are up to date. Ask your health care provider what vaccines you need. °What should I do if I need to travel? °Follow travel recommendations from your local health authority, the CDC, and WHO. °Travel information and advice °· Centers for Disease Control and Prevention (CDC): www.cdc.gov/coronavirus/2019-ncov/travelers/index.html °· World Health Organization (WHO): www.who.int/emergencies/diseases/novel-coronavirus-2019/travel-advice °Know the risks and take action to protect your health °·   You are at higher risk of getting coronavirus disease if you are traveling to areas with an outbreak or if you are exposed to travelers from areas with an outbreak. °· Wash your hands often and practice good hygiene to lower the risk of catching or spreading the virus. °What should I do if I am sick? °General instructions to stop the spread of infection °· Wash your hands often with soap and water for at least 20 seconds. If soap and water are not available, use alcohol-based hand sanitizer. °· Cough or sneeze into a tissue, sleeve, or elbow. Do not cough or sneeze into your hand or the air. °· If you cough or  sneeze into a tissue, throw it away immediately and wash your hands. °· Stay home unless you must get medical care. Call your health care provider or local health authority before you get medical care. °· Avoid public areas. Do not take public transportation, if possible. °· If you can, wear a mask if you must go out of the house or if you are in close contact with someone who is not sick. °Keep your home clean °· Disinfect objects and surfaces that are frequently touched every day. This may include: °? Counters and tables. °? Doorknobs and light switches. °? Sinks and faucets. °? Electronics such as phones, remote controls, keyboards, computers, and tablets. °· Wash dishes in hot, soapy water or use a dishwasher. Air-dry your dishes. °· Wash laundry in hot water. °Prevent infecting other household members °· Let healthy household members care for children and pets, if possible. If you have to care for children or pets, wash your hands often and wear a mask. °· Sleep in a different bedroom or bed, if possible. °· Do not share personal items, such as razors, toothbrushes, deodorant, combs, brushes, towels, and washcloths. °Where to find more information °Centers for Disease Control and Prevention (CDC) °· Information and news updates: www.cdc.gov/coronavirus/2019-ncov °World Health Organization (WHO) °· Information and news updates: www.who.int/emergencies/diseases/novel-coronavirus-2019 °· Coronavirus health topic: www.who.int/health-topics/coronavirus °· Questions and answers on COVID-19: www.who.int/news-room/q-a-detail/q-a-coronaviruses °· Global tracker: who.sprinklr.com °American Academy of Pediatrics (AAP) °· Information for families: www.healthychildren.org/English/health-issues/conditions/chest-lungs/Pages/2019-Novel-Coronavirus.aspx °The coronavirus situation is changing rapidly. Check your local health authority website or the CDC and WHO websites for updates and news. °When should I contact a health care  provider? °· Contact your health care provider if you have symptoms of an infection, such as fever or cough, and you: °? Have been near anyone who is known to have coronavirus disease. °? Have come into contact with a person who is suspected to have coronavirus disease. °? Have traveled outside of the country. °When should I get emergency medical care? °· Get help right away by calling your local emergency services (911 in the [place] if you have: °? Trouble breathing. °? Pain or pressure in your chest. °? Confusion. °? Blue-tinged lips and fingernails. °? Difficulty waking from sleep. °? Symptoms that get worse. °Let the emergency medical personnel know if you think you have coronavirus disease. °Summary °· A new respiratory virus is spreading from person to person and causing COVID-19 (coronavirus disease). °· The virus that causes COVID-19 appears to spread easily. It spreads from one person to another through droplets from coughing or sneezing. °· Older adults and those with chronic diseases are at higher risk of disease. If you are at higher risk for complications, take extra precautions. °· There is currently no vaccine to prevent coronavirus disease. There are no medicines, such as antibiotics or   antivirals, to treat the virus. °· You can protect yourself and your family by washing your hands often, avoiding touching your face, and covering your coughs and sneezes. °This information is not intended to replace advice given to you by your health care provider. Make sure you discuss any questions you have with your health care provider. °Document Released: 08/02/2018 Document Revised: 08/02/2018 Document Reviewed: 08/02/2018 °Elsevier Patient Education © 2020 [employer] ° °

## 2019-02-03 ENCOUNTER — Inpatient Hospital Stay: Payer: Medicare Other

## 2019-02-03 ENCOUNTER — Inpatient Hospital Stay: Payer: Medicare Other | Admitting: Hematology and Oncology

## 2019-02-03 ENCOUNTER — Other Ambulatory Visit: Payer: Self-pay

## 2019-02-03 DIAGNOSIS — I82B11 Acute embolism and thrombosis of right subclavian vein: Secondary | ICD-10-CM | POA: Diagnosis not present

## 2019-02-03 DIAGNOSIS — Z170 Estrogen receptor positive status [ER+]: Secondary | ICD-10-CM

## 2019-02-03 DIAGNOSIS — C50211 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 female breast: Secondary | ICD-10-CM

## 2019-02-03 DIAGNOSIS — R221 Localized swelling, mass and lump, neck: Secondary | ICD-10-CM | POA: Diagnosis not present

## 2019-02-03 DIAGNOSIS — Z5111 Encounter for antineoplastic chemotherapy: Secondary | ICD-10-CM | POA: Diagnosis not present

## 2019-02-03 DIAGNOSIS — Z452 Encounter for adjustment and management of vascular access device: Secondary | ICD-10-CM | POA: Diagnosis not present

## 2019-02-03 DIAGNOSIS — Z5189 Encounter for other specified aftercare: Secondary | ICD-10-CM | POA: Diagnosis not present

## 2019-02-03 DIAGNOSIS — I82C11 Acute embolism and thrombosis of right internal jugular vein: Secondary | ICD-10-CM | POA: Diagnosis not present

## 2019-02-03 DIAGNOSIS — R000 Tachycardia, unspecified: Secondary | ICD-10-CM | POA: Diagnosis not present

## 2019-02-03 LAB — CBC WITH DIFFERENTIAL (CANCER CENTER ONLY)
Abs Immature Granulocytes: 0.04 10*3/uL (ref 0.00–0.07)
Basophils Absolute: 0.1 10*3/uL (ref 0.0–0.1)
Basophils Relative: 1 %
Eosinophils Absolute: 0 10*3/uL (ref 0.0–0.5)
Eosinophils Relative: 0 %
HCT: 29.6 % — ABNORMAL LOW (ref 36.0–46.0)
Hemoglobin: 9.9 g/dL — ABNORMAL LOW (ref 12.0–15.0)
Immature Granulocytes: 1 %
Lymphocytes Relative: 15 %
Lymphs Abs: 1.3 10*3/uL (ref 0.7–4.0)
MCH: 30 pg (ref 26.0–34.0)
MCHC: 33.4 g/dL (ref 30.0–36.0)
MCV: 89.7 fL (ref 80.0–100.0)
Monocytes Absolute: 0.9 10*3/uL (ref 0.1–1.0)
Monocytes Relative: 10 %
Neutro Abs: 6.4 10*3/uL (ref 1.7–7.7)
Neutrophils Relative %: 73 %
Platelet Count: 417 10*3/uL — ABNORMAL HIGH (ref 150–400)
RBC: 3.3 MIL/uL — ABNORMAL LOW (ref 3.87–5.11)
RDW: 15.5 % (ref 11.5–15.5)
WBC Count: 8.7 10*3/uL (ref 4.0–10.5)
nRBC: 0 % (ref 0.0–0.2)

## 2019-02-03 LAB — CMP (CANCER CENTER ONLY)
ALT: 25 U/L (ref 0–44)
AST: 25 U/L (ref 15–41)
Albumin: 3.7 g/dL (ref 3.5–5.0)
Alkaline Phosphatase: 72 U/L (ref 38–126)
Anion gap: 11 (ref 5–15)
BUN: 10 mg/dL (ref 8–23)
CO2: 23 mmol/L (ref 22–32)
Calcium: 9.3 mg/dL (ref 8.9–10.3)
Chloride: 106 mmol/L (ref 98–111)
Creatinine: 0.74 mg/dL (ref 0.44–1.00)
GFR, Est AFR Am: >60 mL/min (ref >60)
GFR, Estimated: >60 mL/min (ref >60)
Glucose, Bld: 87 mg/dL (ref 70–99)
Potassium: 3.7 mmol/L (ref 3.5–5.1)
Sodium: 140 mmol/L (ref 135–145)
Total Bilirubin: 0.3 mg/dL (ref 0.3–1.2)
Total Protein: 6.4 g/dL — ABNORMAL LOW (ref 6.5–8.1)

## 2019-02-03 MED ORDER — SODIUM CHLORIDE 0.9 % IV SOLN
Freq: Once | INTRAVENOUS | Status: AC
  Administered 2019-02-03: 11:00:00 via INTRAVENOUS
  Filled 2019-02-03: qty 250

## 2019-02-03 MED ORDER — PALONOSETRON HCL INJECTION 0.25 MG/5ML
0.2500 mg | Freq: Once | INTRAVENOUS | Status: AC
  Administered 2019-02-03: 0.25 mg via INTRAVENOUS

## 2019-02-03 MED ORDER — SODIUM CHLORIDE 0.9 % IV SOLN
600.0000 mg/m2 | Freq: Once | INTRAVENOUS | Status: AC
  Administered 2019-02-03: 1060 mg via INTRAVENOUS
  Filled 2019-02-03: qty 53

## 2019-02-03 MED ORDER — HEPARIN SOD (PORK) LOCK FLUSH 100 UNIT/ML IV SOLN
500.0000 [IU] | Freq: Once | INTRAVENOUS | Status: AC | PRN
  Administered 2019-02-03: 500 [IU]
  Filled 2019-02-03: qty 5

## 2019-02-03 MED ORDER — PALONOSETRON HCL INJECTION 0.25 MG/5ML
INTRAVENOUS | Status: AC
  Filled 2019-02-03: qty 5

## 2019-02-03 MED ORDER — SODIUM CHLORIDE 0.9% FLUSH
10.0000 mL | INTRAVENOUS | Status: DC | PRN
  Administered 2019-02-03: 14:00:00 10 mL
  Filled 2019-02-03: qty 10

## 2019-02-03 MED ORDER — PEGFILGRASTIM 6 MG/0.6ML ~~LOC~~ PSKT
6.0000 mg | PREFILLED_SYRINGE | Freq: Once | SUBCUTANEOUS | Status: AC
  Administered 2019-02-03: 6 mg via SUBCUTANEOUS

## 2019-02-03 MED ORDER — SODIUM CHLORIDE 0.9% FLUSH
10.0000 mL | INTRAVENOUS | Status: DC | PRN
  Administered 2019-02-03: 10 mL
  Filled 2019-02-03: qty 10

## 2019-02-03 MED ORDER — PEGFILGRASTIM 6 MG/0.6ML ~~LOC~~ PSKT
PREFILLED_SYRINGE | SUBCUTANEOUS | Status: AC
  Filled 2019-02-03: qty 0.6

## 2019-02-03 MED ORDER — SODIUM CHLORIDE 0.9 % IV SOLN
Freq: Once | INTRAVENOUS | Status: AC
  Administered 2019-02-03: 12:00:00 via INTRAVENOUS
  Filled 2019-02-03: qty 5

## 2019-02-03 MED ORDER — SODIUM CHLORIDE 0.9 % IV SOLN
75.0000 mg/m2 | Freq: Once | INTRAVENOUS | Status: AC
  Administered 2019-02-03: 130 mg via INTRAVENOUS
  Filled 2019-02-03: qty 13

## 2019-02-03 NOTE — Progress Notes (Signed)
Patient Care Team: [name], MD as PCP - General (Family Medicine)  DIAGNOSIS:    ICD-10-CM   1. 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 breast in female, estrogen receptor positive ([place])  C50.211    Z17.0     SUMMARY OF ONCOLOGIC HISTORY: Oncology History  Breast cancer of upper-inner quadrant of right female breast ([name])  12/05/2015 Mammogram   Right breast mass 2:00 4 cm from nipple: 1.7 cm, T1c N0 stage IA; right breast 2:00 6 cm from nipple 1.3 cm mass, 7 mm satellite nodule between the 2 masses, T1 cN0 stage IA   12/06/2015 Initial Diagnosis   Right breast biopsy 2:00 6 cm from nipple: Grade 2-3 invasive ductal cancer, ER 90%, PR 2%, Ki-67 80%, HER-2 positive ratio 2.07; right biopsy 2:00 4 cm from nipple: IDC with DCIS, ER 95%, PR 10%, Ki-67 90%, HER-2 negative ratio 1.27   12/18/2015 Breast MRI   3 enhancing masses in the upper inner quadrant right breast spanning an area 3.7 cm (1.7 cm, 1 cm, 1.4 cm) no lymph node enlargement   12/31/2015 - 04/15/2016 Neo-Adjuvant Chemotherapy   TCH Perjeta 6 cycles followed by Herceptin, Perjeta maintenance for 1 year completed 10/20/2016   01/08/2016 - 01/12/2016 Hospital Admission   Neutropenic fever hospitalization (because patient did not receive Neulasta with cycle 1)   01/10/2016 Miscellaneous   Genetic testing was normal did not reveal any mutations   04/16/2016 Breast MRI   Near CR to therapy. Previous 2 biopsied massses are no longer seen. Third satellite lesion is smaller from 10 mm to 5.3 mm, no abnormal LN.   05/19/2016 Surgery   Right mastectomy: IDC grade 3, 1.3 cm, low-grade DCIS, LVIDS present, margins negative, 0/1 lymph node negative, ypT1CypN0 stage IA; repeat HER-2 negative ratio 1.41   05/19/2016 Surgery   Right breast reconstruction with tissue expander. Acellular dermis for breast reconstruction (Dr.Thimappa)    06/22/2016 -  Anti-estrogen oral therapy   Letrozole 2.5 mg daily   11/01/2018  Relapse/Recurrence   Patient palpated lump in the UIQ at surgical scar of reconstructed right breast. US of the right breast showed a 1.8cm mass suspicious for recurrent breast cancer. Biopsy showed grade 2 invasive ductal carcinoma, HER-2 negative (1+), ER 90%, PR negative, Ki67 40%.   11/11/2018 Oncotype testing   Oncotype score 52: Greater than 39% risk of distant recurrence with hormone therapy alone, chemo benefit greater than 15%   11/30/2018 Surgery   Right lumpectomy [name]): IDC, grade 3, 2.1cm, perineural invasion present, carcinoma present focally at the posterior margin and involving skeletal muscle.   11/30/2018 Cancer Staging   Staging form: Breast, AJCC 8th Edition - Pathologic stage from 11/30/2018: Stage IIA (pT2, pN0, cM0, G3, ER+, PR-, HER2-) - Signed by Gardenia Phlegm, NP on 12/14/2018   12/23/2018 -  Chemotherapy   The patient had palonosetron (ALOXI) injection 0.25 mg, 0.25 mg, Intravenous,  Once, 5 of 6 cycles Administration: 0.25 mg (12/23/2018), 0.25 mg (01/13/2019) pegfilgrastim (NEULASTA ONPRO KIT) injection 6 mg, 6 mg, Subcutaneous, Once, 5 of 6 cycles Administration: 6 mg (12/23/2018), 6 mg (01/13/2019) cyclophosphamide (CYTOXAN) 1,060 mg in sodium chloride 0.9 % 250 mL chemo infusion, 600 mg/m2 = 1,060 mg, Intravenous,  Once, 5 of 6 cycles Administration: 1,060 mg (12/23/2018), 1,060 mg (01/13/2019) DOCEtaxel (TAXOTERE) 130 mg in sodium chloride 0.9 % 250 mL chemo infusion, 75 mg/m2 = 130 mg, Intravenous,  Once, 5 of 6 cycles Administration: 130 mg (12/23/2018), 130 mg (01/13/2019)  fosaprepitant (EMEND) 150 mg in sodium chloride 0.9 % 145 mL IVPB, , Intravenous,  Once, 5 of 6 cycles Administration:  (12/23/2018),  (01/13/2019)  for chemotherapy treatment.      CHIEF COMPLIANT: Cycle 3 Taxotere and Cytoxan  INTERVAL HISTORY: Patricia Chandler is a 63 y.o. with above-mentioned history of recurrent right breast cancerwho underwent a lumpectomy and is  currently on adjuvant chemotherapy with Taxotere and Cytoxan.She presents to the clinic todayfor cycle 3.  She had a blood clot involving the jugular vein and is currently on blood thinners.  The swelling has improved markedly after the blood thinners were started.  She does not have any further pain issues.  She was more fatigued after this round of chemo.  REVIEW OF SYSTEMS:   Constitutional: Denies fevers, chills or abnormal weight loss Eyes: Denies blurriness of vision Ears, nose, mouth, throat, and face: Denies mucositis or sore throat Respiratory: Denies cough, dyspnea or wheezes Cardiovascular: Denies palpitation, chest discomfort Gastrointestinal: Denies nausea, heartburn or change in bowel habits Skin: Denies abnormal skin rashes Lymphatics: Denies new lymphadenopathy or easy bruising Neurological: Denies numbness, tingling or new weaknesses Behavioral/Psych: Mood is stable, no new changes  Extremities: No lower extremity edema Breast: denies any pain or lumps or nodules in either breasts All other systems were reviewed with the patient and are negative.  I have reviewed the past medical history, past surgical history, social history and family history with the patient and they are unchanged from previous note.  ALLERGIES:  has No Known Allergies.  MEDICATIONS:  Current Outpatient Medications  Medication Sig Dispense Refill  . Biotin 1 MG CAPS Take by mouth.    . calcium-vitamin D (OSCAL WITH D) 500-200 MG-UNIT tablet Take 1 tablet by mouth daily with breakfast.    . carvedilol (COREG) 3.125 MG tablet     . cyclophosphamide in sodium chloride 0.9 % 250 mL Inject into the vein as directed. Once every three weeks  Not sure of dose    . denosumab (PROLIA) 60 MG/ML SOLN injection Inject 60 mg into the skin every 6 (six) months. Administer in upper arm, thigh, or abdomen    . dexamethasone (DECADRON) 4 MG tablet Take 1 tablet (4 mg total) by mouth 2 (two) times daily. Take 1 tablet  day before chemo and 1 tablet day after chemo with food 8 tablet 0  . dicyclomine (BENTYL) 10 MG capsule Take 1 capsule (10 mg total) by mouth 4 (four) times daily -  before meals and at bedtime. (Patient taking differently: Take 10 mg by mouth as needed. ) 60 capsule 1  . DOCEtaxel (TAXOTERE IV) Inject into the vein as directed. Once every three weeks    . Lactobacillus (PROBIOTIC ACIDOPHILUS PO) Take 1 capsule by mouth daily.     Marland Kitchen lidocaine-prilocaine (EMLA) cream Apply to affected area once 30 g 3  . LORazepam (ATIVAN) 0.5 MG tablet Take 1 tablet (0.5 mg total) by mouth at bedtime as needed for sleep. 30 tablet 0  . losartan (COZAAR) 25 MG tablet     . omeprazole (PRILOSEC) 20 MG capsule Take 20 mg by mouth daily as needed (heartburn).    . ondansetron (ZOFRAN) 8 MG tablet Take 1 tablet (8 mg total) by mouth 2 (two) times daily as needed for refractory nausea / vomiting. Start on day 3 after chemo. 30 tablet 1  . oxyCODONE-acetaminophen (PERCOCET/ROXICET) 5-325 MG tablet Take 1 tablet by mouth every 6 (six) hours as needed for severe pain.  30 tablet 0  . prochlorperazine (COMPAZINE) 10 MG tablet Take 1 tablet (10 mg total) by mouth every 6 (six) hours as needed (Nausea or vomiting). 30 tablet 1  . rivaroxaban (XARELTO) 20 MG TABS tablet Take 1 tablet (20 mg total) by mouth daily with supper. 30 tablet 4  . Rivaroxaban 15 & 20 MG TBPK Follow package directions: Take one 39m tablet by mouth twice a day. On day 22, switch to one 266mtablet once a day. Take with food. 51 each 0  . zolpidem (AMBIEN) 5 MG tablet Take 0.5 tablets (2.5 mg total) by mouth at bedtime as needed for sleep. for sleep 30 tablet 3   No current facility-administered medications for this visit.     PHYSICAL EXAMINATION: ECOG PERFORMANCE STATUS: 1 - Symptomatic but completely ambulatory  Vitals:   02/03/19 1017  BP: 127/83  Pulse: 81  Resp: 17  Temp: 97.8 F (36.6 C)  SpO2: 100%   Filed Weights   02/03/19 1017   Weight: 150 lb 1.6 oz (68.1 kg)    GENERAL: alert, no distress and comfortable SKIN: skin color, texture, turgor are normal, no rashes or significant lesions EYES: normal, Conjunctiva are pink and non-injected, sclera clear OROPHARYNX: no exudate, no erythema and lips, buccal mucosa, and tongue normal  NECK: supple, thyroid normal size, non-tender, without nodularity LYMPH: no palpable lymphadenopathy in the cervical, axillary or inguinal LUNGS: clear to auscultation and percussion with normal breathing effort HEART: regular rate & rhythm and no murmurs and no lower extremity edema ABDOMEN: abdomen soft, non-tender and normal bowel sounds MUSCULOSKELETAL: no cyanosis of digits and no clubbing  NEURO: alert & oriented x 3 with fluent speech, no focal motor/sensory deficits EXTREMITIES: No lower extremity edema  LABORATORY DATA:  I have reviewed the data as listed CMP Latest Ref Rng & Units 02/03/2019 01/25/2019 01/13/2019  Glucose 70 - 99 mg/dL 87 115(H) 88  BUN 8 - 23 mg/dL '10 13 14  ' Creatinine 0.44 - 1.00 mg/dL 0.74 0.82 0.76  Sodium 135 - 145 mmol/L 140 137 139  Potassium 3.5 - 5.1 mmol/L 3.7 3.9 4.1  Chloride 98 - 111 mmol/L 106 102 109  CO2 22 - 32 mmol/L '23 25 25  ' Calcium 8.9 - 10.3 mg/dL 9.3 9.2 8.9  Total Protein 6.5 - 8.1 g/dL 6.4(L) 6.7 6.3(L)  Total Bilirubin 0.3 - 1.2 mg/dL 0.3 0.3 0.3  Alkaline Phos 38 - 126 U/L 72 126 56  AST 15 - 41 U/L '25 20 29  ' ALT 0 - 44 U/L 25 26 33    Lab Results  Component Value Date   WBC 8.7 02/03/2019   HGB 9.9 (L) 02/03/2019   HCT 29.6 (L) 02/03/2019   MCV 89.7 02/03/2019   PLT 417 (H) 02/03/2019   NEUTROABS 6.4 02/03/2019    ASSESSMENT & PLAN:  Breast cancer of upper-inner quadrant of right female breast ([facility]) Right breast biopsy 2:00 6 cm from nipple: Grade 2-3 invasive ductal cancer, ER 90%, PR 2%, Ki-67 80%, HER-2 positive ratio 2.07; right biopsy 2:00 4 cm from nipple: IDC with DCIS, ER 95%, PR 10%, Ki-67 90%, HER-2 negative  ratio 1.27 Breast MRI08/30/2017: 3 enhancing masses in the upper inner quadrant right breast spanning an area 3.7 cm (1.7 cm, 1 cm, 1.4 cm) no lymph node enlargement Clinical stage: T2N0 (stage 2A)  Treatment plan: 1. Genetic counseling: Normal did not reveal any abnormal mutations 2. Neoadjuvant chemotherapy. (TCOgemaerjeta 6 cycles followed by Herceptin And Perjetamaintenance completed  10/20/2016) from 12/31/15 to 04/15/16 3. Right mastectomy with reconstruction: 05/19/2016:Right mastectomy: IDC grade 3, 1.3 cm, low-grade DCIS, LVIDS present, margins negative, 0/1 lymph node negative, ypT1CypN0 stage IA; repeat HER-2 negative ratio 1.41 4. Adjuvant antiestrogen therapy With letrozole 2.5 mg daily started March 2018 -------------------------------------------------------------------------------------------------------------------------------------------------- Breast cancer: Subcutaneous disease: 1.8 cm measured by ultrasound Biopsy revealed grade 2-3 IDC ER 90% PR 0% HER-2 negative Ki-67 40% 11/30/2018 right lumpectomy [name]): IDC, grade 3, 2.1cm, perineural invasion present, carcinoma present focally at the posterior margin and involving skeletal muscle. Oncotype score 52: Greater than 39% risk of distant recurrence with hormone therapy alone, chemo benefit greater than 15%  Recommendation: 1.Surgery to remove the tumor8/03/2019 2.Adjuvant chemotherapy withTaxotere and Cytoxan x6 cyclesstarted 12/30/2018 3.Adjuvant radiation 4.Follow-up adjuvant antiestrogen therapy ------------------------------------------------------------------------------------------------------------------------------------ Current treatment: Cycle 3-day 1Taxotere and Cytoxan Labs reviewed  Chemo toxicities: 1.Mild body aches 2.Fatigue: More fatigue than the first cycle. 3. Headaches: Probably due to Aloxi 4.  Chemotherapy-induced anemia: Hemoglobin is 9.9  We will continue to watch and  monitor this. 5.  Jugular vein DVT related to the port: Currently on blood thinners swelling has markedly improved.  Return to clinic in 3 weeks for cycle 4    No orders of the defined types were placed in this encounter.  The patient has a good understanding of the overall plan. she agrees with it. she will call with any problems that may develop before the next visit here.  Nicholas Lose, MD 02/03/2019  Julious Oka Dorshimer am acting as scribe for Dr. Nicholas Lose.  I have reviewed the above documentation for accuracy and completeness, and I agree with the above.

## 2019-02-03 NOTE — Patient Instructions (Signed)
[place] Discharge Instructions for Patients Receiving Chemotherapy  Today you received the following chemotherapy agents: taxotere, Cytoxan  To help prevent nausea and vomiting after your treatment, we encourage you to take your nausea medication as directed.   If you develop nausea and vomiting that is not controlled by your nausea medication, call the clinic.   BELOW ARE SYMPTOMS THAT SHOULD BE REPORTED IMMEDIATELY:  *FEVER GREATER THAN 100.5 F  *CHILLS WITH OR WITHOUT FEVER  NAUSEA AND VOMITING THAT IS NOT CONTROLLED WITH YOUR NAUSEA MEDICATION  *UNUSUAL SHORTNESS OF BREATH  *UNUSUAL BRUISING OR BLEEDING  TENDERNESS IN MOUTH AND THROAT WITH OR WITHOUT PRESENCE OF ULCERS  *URINARY PROBLEMS  *BOWEL PROBLEMS  UNUSUAL RASH Items with * indicate a potential emergency and should be followed up as soon as possible.  Feel free to call the clinic should you have any questions or concerns. The clinic phone number is [phone].  Please show the [place] at check-in to the Emergency Department and triage nurse.

## 2019-02-03 NOTE — Assessment & Plan Note (Signed)
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 Genetic counseling: Normal did not reveal any abnormal mutations 2. Neoadjuvant chemotherapy. [facility] Perjeta 6 cycles followed by Herceptin And Perjetamaintenance completed 10/20/2016) from 12/31/15 to 04/15/16 3. Right mastectomy with reconstruction: 05/19/2016:Right mastectomy: IDC grade 3, 1.3 cm, low-grade DCIS, LVIDS present, margins negative, 0/1 lymph node negative, ypT1CypN0 stage IA; repeat HER-2 negative ratio 1.41 4. Adjuvant antiestrogen therapy With letrozole 2.5 mg daily started March 2018 -------------------------------------------------------------------------------------------------------------------------------------------------- Breast cancer: Subcutaneous disease: 1.8 cm measured by ultrasound Biopsy revealed grade 2-3 IDC ER 90% PR 0% HER-2 negative Ki-67 40% 11/30/2018 right lumpectomy [name]): IDC, grade 3, 2.1cm, perineural invasion present, carcinoma present focally at the posterior margin and involving skeletal muscle. Oncotype score 52: Greater than 39% risk of distant recurrence with hormone therapy alone, chemo benefit greater than 15%  Recommendation: 1.Surgery to remove the tumor8/03/2019 2.Adjuvant chemotherapy withTaxotere and Cytoxan x6 cyclesstarted 12/30/2018 3.Adjuvant radiation 4.Follow-up adjuvant antiestrogen therapy ------------------------------------------------------------------------------------------------------------------------------------ Current treatment: Cycle 3-day 1Taxotere and Cytoxan Labs  reviewed  Chemo toxicities: 1.Mild body aches 2.Fatigue: More fatigue than the first cycle. 3. Headaches: Probably due to Aloxi 4.  Chemotherapy-induced anemia: Hemoglobin is 9.9  We will continue to watch and monitor this. 5.  Jugular vein DVT related to the port: Currently on blood thinners swelling has markedly improved.  Return to clinic in 3 weeks for cycle 4

## 2019-02-03 NOTE — Patient Instructions (Signed)

## 2019-02-04 ENCOUNTER — Ambulatory Visit: Payer: Medicare Other

## 2019-02-08 ENCOUNTER — Inpatient Hospital Stay: Payer: Medicare Other

## 2019-02-08 ENCOUNTER — Ambulatory Visit: Payer: Medicare Other | Admitting: Psychology

## 2019-02-08 ENCOUNTER — Telehealth: Payer: Self-pay | Admitting: Emergency Medicine

## 2019-02-08 NOTE — Telephone Encounter (Signed)
Returning pt's VM requesting to move IVF appt from 02/10/2019 at 0900 to 02/09/2019 at 0900.  High priority scheduling message sent to move appt date/time and location from infusion suite to [facility].  Desk RN Merleen Nicely made aware.

## 2019-02-08 NOTE — Progress Notes (Signed)
Nutrition Assessment   Reason for Assessment: Patient identified on Malnutrition Screening report for weight loss and poor appetite   ASSESSMENT:  63 year old female with recurrent right breast cancer followed by Dr. Lindi Adie.  Patient currently on adjuvant chemotherapy.  Breast cancer noted in 2017 with chemotherapy.   Spoke with patient via phone and reports that she is having a better day today.  Eating a little bit more.  Reports that she has no appetite or desire to eat.  Reports that when she tries to eat she feel stuffed or has crampy feeling in stomach, not really nausea.  Has not vomited.  Reports that she usually eats about 1/2 avocado, few bites of banana and yogurt.  Ate grits last night.  Also reports taste alterations.  Reports that the last week before coming back for treatment she is usually eating better and appetite has returned.    Is not taking nausea medications or bentyl  Nutrition Focused Physical Exam: deferred   Medications: bentyl, zofran, compazine   Labs: reviewed   Anthropometrics:   Height: 63 inches Weight: 150 lb 1.6 oz 147-155 lb BMI: 26    NUTRITION DIAGNOSIS: Inadequate oral intake related to cancer related treatment side effects as evidenced by eating < 50% energy needs during the first week to 2 weeks after chemotherapy   INTERVENTION:  Encouraged patient to set a schedule and nibble/mini snack frequent during the day, q 1-2 hours.  Patient planning on speaking with [facility] tomorrow about taking nausea medications and bentyl to help with stomach issues.  Discussed strategies to help with taste changes.  Active listening provided to patient  Contact information provided   MONITORING, EVALUATION, GOAL: Patient will consume adequate calories and protein to maintain weight during treatment   Next Visit: patient to contact as needed  Khylen Riolo B. Zenia Resides, [place], [place] Registered Dietitian [phone] (pager)

## 2019-02-09 ENCOUNTER — Other Ambulatory Visit: Payer: Self-pay

## 2019-02-09 ENCOUNTER — Inpatient Hospital Stay: Payer: Medicare Other

## 2019-02-09 ENCOUNTER — Telehealth: Payer: Self-pay | Admitting: Emergency Medicine

## 2019-02-09 VITALS — BP 101/66 | HR 93 | Temp 98.3°F | Resp 18 | Ht 63.0 in | Wt 145.2 lb

## 2019-02-09 DIAGNOSIS — C50211 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 female breast: Secondary | ICD-10-CM

## 2019-02-09 DIAGNOSIS — Z5189 Encounter for other specified aftercare: Secondary | ICD-10-CM | POA: Diagnosis not present

## 2019-02-09 DIAGNOSIS — Z170 Estrogen receptor positive status [ER+]: Secondary | ICD-10-CM | POA: Diagnosis not present

## 2019-02-09 DIAGNOSIS — Z5111 Encounter for antineoplastic chemotherapy: Secondary | ICD-10-CM | POA: Diagnosis not present

## 2019-02-09 DIAGNOSIS — R000 Tachycardia, unspecified: Secondary | ICD-10-CM | POA: Diagnosis not present

## 2019-02-09 DIAGNOSIS — Z452 Encounter for adjustment and management of vascular access device: Secondary | ICD-10-CM | POA: Diagnosis not present

## 2019-02-09 DIAGNOSIS — I82B11 Acute embolism and thrombosis of right subclavian vein: Secondary | ICD-10-CM | POA: Diagnosis not present

## 2019-02-09 DIAGNOSIS — I82C11 Acute embolism and thrombosis of right internal jugular vein: Secondary | ICD-10-CM | POA: Diagnosis not present

## 2019-02-09 DIAGNOSIS — R221 Localized swelling, mass and lump, neck: Secondary | ICD-10-CM | POA: Diagnosis not present

## 2019-02-09 MED ORDER — SODIUM CHLORIDE 0.9% FLUSH
10.0000 mL | Freq: Once | INTRAVENOUS | Status: AC
  Administered 2019-02-09: 10 mL
  Filled 2019-02-09: qty 10

## 2019-02-09 MED ORDER — SODIUM CHLORIDE 0.9 % IV SOLN
Freq: Once | INTRAVENOUS | Status: AC
  Administered 2019-02-09: 15:00:00 via INTRAVENOUS
  Filled 2019-02-09: qty 250

## 2019-02-09 MED ORDER — HEPARIN SOD (PORK) LOCK FLUSH 100 UNIT/ML IV SOLN
500.0000 [IU] | Freq: Once | INTRAVENOUS | Status: AC
  Administered 2019-02-09: 500 [IU]
  Filled 2019-02-09: qty 5

## 2019-02-09 NOTE — Progress Notes (Signed)
Pt received 1L IVF NS today over 2 hours per MD Gudena.  Tolerated well.  Reports feeling a bit better at end of tx.  VSS.  Able to eat & drink some during infusion without any issues.

## 2019-02-09 NOTE — Patient Instructions (Signed)

## 2019-02-09 NOTE — Telephone Encounter (Signed)
Returning VM from pt's husband stating that "my wife had a rough night and I was wondering if we could move her appointment for fluids to later today".  Left VM on husband's cell phone stating that the appt would be changed to 2pm today and that he didn't need to call back unless this time didn't work or if he had any questions or concerns.  Appt time changed.

## 2019-02-10 ENCOUNTER — Inpatient Hospital Stay: Payer: Medicare Other

## 2019-02-13 ENCOUNTER — Telehealth: Payer: Self-pay | Admitting: Hematology and Oncology

## 2019-02-13 ENCOUNTER — Telehealth: Payer: Self-pay

## 2019-02-13 NOTE — Telephone Encounter (Signed)
Pt LVM stating she needed to change some of her IVF appointments.  Call returned.  LVM for pt to return call.

## 2019-02-13 NOTE — Telephone Encounter (Signed)
Called pt per 10/26 sch message - pt aware of appt date and time

## 2019-02-14 ENCOUNTER — Telehealth: Payer: Self-pay | Admitting: Hematology and Oncology

## 2019-02-14 ENCOUNTER — Ambulatory Visit: Payer: Medicare Other | Admitting: Psychology

## 2019-02-14 NOTE — Telephone Encounter (Signed)
R/s appt per 10/26 sch message per pt request . Pt aware of new appt date and time

## 2019-02-17 ENCOUNTER — Ambulatory Visit: Payer: Medicare Other

## 2019-02-21 ENCOUNTER — Other Ambulatory Visit: Payer: Self-pay | Admitting: Hematology and Oncology

## 2019-02-21 ENCOUNTER — Telehealth: Payer: Self-pay | Admitting: *Deleted

## 2019-02-21 DIAGNOSIS — Z170 Estrogen receptor positive status [ER+]: Secondary | ICD-10-CM

## 2019-02-21 DIAGNOSIS — C50211 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 female breast: Secondary | ICD-10-CM

## 2019-02-21 MED ORDER — LORAZEPAM 0.5 MG PO TABS: 0.5000 mg | ORAL_TABLET | Freq: Every evening | ORAL | 3 refills | Status: DC | PRN

## 2019-02-21 NOTE — Telephone Encounter (Signed)
Patient calling to request refill of Ativan please  0.5mg  tablet taken daily #30 to [place]

## 2019-02-22 ENCOUNTER — Telehealth: Payer: Self-pay

## 2019-02-22 ENCOUNTER — Ambulatory Visit (INDEPENDENT_AMBULATORY_CARE_PROVIDER_SITE_OTHER): Payer: Medicare Other | Admitting: Psychology

## 2019-02-22 DIAGNOSIS — F064 Anxiety disorder due to known physiological condition: Secondary | ICD-10-CM

## 2019-02-22 NOTE — Telephone Encounter (Signed)
RN spoke with patient regarding request for refill on Ambien.  Pt reports she still has refills however wanted to see if MD would approve her going back to her original dose of Ambien 5mg  tablet, 1 tablet QHS vs 1/2 tablet QHS.    RN explained this would be reviewed by MD for approval and then we can notify pharmacy.  Pt voiced understanding.

## 2019-02-23 ENCOUNTER — Telehealth: Payer: Self-pay

## 2019-02-23 MED ORDER — ZOLPIDEM TARTRATE 5 MG PO TABS: 5.0000 mg | ORAL_TABLET | Freq: Every day | ORAL | 0 refills | Status: DC

## 2019-02-23 NOTE — Telephone Encounter (Signed)
MD approved for patient to start Ambien 5mg  tablet, 1 tablet QHS.    Pt notified and voiced understanding. New Rx will be sent to [facility].

## 2019-02-23 NOTE — Progress Notes (Signed)
Patient Care Team: [name], MD as PCP - General (Family Medicine)  DIAGNOSIS:    ICD-10-CM   1. 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 breast in female, estrogen receptor positive ([place])  C50.211    Z17.0     SUMMARY OF ONCOLOGIC HISTORY: Oncology History  Breast cancer of upper-inner quadrant of right female breast ([place])  12/05/2015 Mammogram   Right breast mass 2:00 4 cm from nipple: 1.7 cm, T1c N0 stage IA; right breast 2:00 6 cm from nipple 1.3 cm mass, 7 mm satellite nodule between the 2 masses, T1 cN0 stage IA   12/06/2015 Initial Diagnosis   Right breast biopsy 2:00 6 cm from nipple: Grade 2-3 invasive ductal cancer, ER 90%, PR 2%, Ki-67 80%, HER-2 positive ratio 2.07; right biopsy 2:00 4 cm from nipple: IDC with DCIS, ER 95%, PR 10%, Ki-67 90%, HER-2 negative ratio 1.27   12/18/2015 Breast MRI   3 enhancing masses in the upper inner quadrant right breast spanning an area 3.7 cm (1.7 cm, 1 cm, 1.4 cm) no lymph node enlargement   12/31/2015 - 04/15/2016 Neo-Adjuvant Chemotherapy   [facility] Perjeta 6 cycles followed by Herceptin, Perjeta maintenance for 1 year completed 10/20/2016   01/08/2016 - 01/12/2016 Hospital Admission   Neutropenic fever hospitalization (because patient did not receive Neulasta with cycle 1)   01/10/2016 Miscellaneous   Genetic testing was normal did not reveal any mutations   04/16/2016 Breast MRI   Near CR to therapy. Previous 2 biopsied massses are no longer seen. Third satellite lesion is smaller from 10 mm to 5.3 mm, no abnormal LN.   05/19/2016 Surgery   Right mastectomy: IDC grade 3, 1.3 cm, low-grade DCIS, LVIDS present, margins negative, 0/1 lymph node negative, ypT1CypN0 stage IA; repeat HER-2 negative ratio 1.41   05/19/2016 Surgery   Right breast reconstruction with tissue expander. Acellular dermis for breast reconstruction (Dr.Thimappa)    06/22/2016 -  Anti-estrogen oral therapy   Letrozole 2.5 mg daily   11/01/2018  Relapse/Recurrence   Patient palpated lump in the UIQ at surgical scar of reconstructed right breast. US of the right breast showed a 1.8cm mass suspicious for recurrent breast cancer. Biopsy showed grade 2 invasive ductal carcinoma, HER-2 negative (1+), ER 90%, PR negative, Ki67 40%.   11/11/2018 Oncotype testing   Oncotype score 52: Greater than 39% risk of distant recurrence with hormone therapy alone, chemo benefit greater than 15%   11/30/2018 Surgery   Right lumpectomy [name]): IDC, grade 3, 2.1cm, perineural invasion present, carcinoma present focally at the posterior margin and involving skeletal muscle.   11/30/2018 Cancer Staging   Staging form: Breast, AJCC 8th Edition - Pathologic stage from 11/30/2018: Stage IIA (pT2, pN0, cM0, G3, ER+, PR-, HER2-) - Signed by Gardenia Phlegm, NP on 12/14/2018   12/23/2018 -  Chemotherapy   The patient had palonosetron (ALOXI) injection 0.25 mg, 0.25 mg, Intravenous,  Once, 6 of 7 cycles Administration: 0.25 mg (12/23/2018), 0.25 mg (01/13/2019), 0.25 mg (02/03/2019) pegfilgrastim (NEULASTA ONPRO KIT) injection 6 mg, 6 mg, Subcutaneous, Once, 6 of 7 cycles Administration: 6 mg (12/23/2018), 6 mg (01/13/2019), 6 mg (02/03/2019) cyclophosphamide (CYTOXAN) 1,060 mg in sodium chloride 0.9 % 250 mL chemo infusion, 600 mg/m2 = 1,060 mg, Intravenous,  Once, 6 of 7 cycles Administration: 1,060 mg (12/23/2018), 1,060 mg (01/13/2019), 1,060 mg (02/03/2019) DOCEtaxel (TAXOTERE) 130 mg in sodium chloride 0.9 % 250 mL chemo infusion, 75 mg/m2 = 130 mg, Intravenous,  Once, 6 of  7 cycles Administration: 130 mg (12/23/2018), 130 mg (01/13/2019), 130 mg (02/03/2019) fosaprepitant (EMEND) 150 mg in sodium chloride 0.9 % 145 mL IVPB, , Intravenous,  Once, 6 of 7 cycles Administration:  (12/23/2018),  (02/03/2019),  (01/13/2019)  for chemotherapy treatment.      CHIEF COMPLIANT: Cycle4Taxotere and Cytoxan  INTERVAL HISTORY: Patricia Chandler is a 63 y.o.  with above-mentioned history of  recurrent right breast cancerwho underwent a lumpectomy and is currently on adjuvant chemotherapy with Taxotere and Cytoxan.She presents to the clinic todayforcycle 4.  She did significantly better after the last chemotherapy.  She did not have any nausea or vomiting.  She bounced back very quickly.  Does not have neuropathy.  REVIEW OF SYSTEMS:   Constitutional: Denies fevers, chills or abnormal weight loss Eyes: Denies blurriness of vision Ears, nose, mouth, throat, and face: Denies mucositis or sore throat Respiratory: Denies cough, dyspnea or wheezes Cardiovascular: Denies palpitation, chest discomfort Gastrointestinal: Denies nausea, heartburn or change in bowel habits Skin: Denies abnormal skin rashes Lymphatics: Denies new lymphadenopathy or easy bruising Neurological: Denies numbness, tingling or new weaknesses Behavioral/Psych: Mood is stable, no new changes  Extremities: No lower extremity edema Breast: denies any pain or lumps or nodules in either breasts All other systems were reviewed with the patient and are negative.  I have reviewed the past medical history, past surgical history, social history and family history with the patient and they are unchanged from previous note.  ALLERGIES:  has No Known Allergies.  MEDICATIONS:  Current Outpatient Medications  Medication Sig Dispense Refill  . Biotin 1 MG CAPS Take by mouth.    . calcium-vitamin D (OSCAL WITH D) 500-200 MG-UNIT tablet Take 1 tablet by mouth daily with breakfast.    . cyclophosphamide in sodium chloride 0.9 % 250 mL Inject into the vein as directed. Once every three weeks  Not sure of dose    . denosumab (PROLIA) 60 MG/ML SOLN injection Inject 60 mg into the skin every 6 (six) months. Administer in upper arm, thigh, or abdomen    . dexamethasone (DECADRON) 4 MG tablet Take 1 tablet (4 mg total) by mouth 2 (two) times daily. Take 1 tablet day before chemo and 1 tablet day  after chemo with food 8 tablet 0  . dicyclomine (BENTYL) 10 MG capsule Take 1 capsule (10 mg total) by mouth 4 (four) times daily -  before meals and at bedtime. (Patient taking differently: Take 10 mg by mouth as needed. ) 60 capsule 1  . DOCEtaxel (TAXOTERE IV) Inject into the vein as directed. Once every three weeks    . Lactobacillus (PROBIOTIC ACIDOPHILUS PO) Take 1 capsule by mouth daily.     Marland Kitchen lidocaine-prilocaine (EMLA) cream Apply to affected area once 30 g 3  . LORazepam (ATIVAN) 0.5 MG tablet Take 1 tablet (0.5 mg total) by mouth at bedtime as needed for sleep. 30 tablet 3  . omeprazole (PRILOSEC) 20 MG capsule Take 20 mg by mouth daily as needed (heartburn).    . ondansetron (ZOFRAN) 8 MG tablet Take 1 tablet (8 mg total) by mouth 2 (two) times daily as needed for refractory nausea / vomiting. Start on day 3 after chemo. 30 tablet 1  . oxyCODONE-acetaminophen (PERCOCET/ROXICET) 5-325 MG tablet Take 1 tablet by mouth every 6 (six) hours as needed for severe pain. 30 tablet 0  . prochlorperazine (COMPAZINE) 10 MG tablet Take 1 tablet (10 mg total) by mouth every 6 (six) hours as needed (Nausea or  vomiting). 30 tablet 1  . Rivaroxaban 15 & 20 MG TBPK Follow package directions: Take one 15mg tablet by mouth twice a day. On day 22, switch to one 20mg tablet once a day. Take with food. 51 each 0  . zolpidem (AMBIEN) 5 MG tablet Take 1 tablet (5 mg total) by mouth at bedtime. 30 tablet 0   No current facility-administered medications for this visit.     PHYSICAL EXAMINATION: ECOG PERFORMANCE STATUS: 1 - Symptomatic but completely ambulatory  Vitals:   02/24/19 1052  BP: 120/79  Pulse: 82  Resp: 18  Temp: 98.2 F (36.8 C)  SpO2: 100%   Filed Weights   02/24/19 1052  Weight: 147 lb 4.8 oz (66.8 kg)    GENERAL: alert, no distress and comfortable SKIN: skin color, texture, turgor are normal, no rashes or significant lesions EYES: normal, Conjunctiva are pink and non-injected,  sclera clear OROPHARYNX: no exudate, no erythema and lips, buccal mucosa, and tongue normal  NECK: supple, thyroid normal size, non-tender, without nodularity LYMPH: no palpable lymphadenopathy in the cervical, axillary or inguinal LUNGS: clear to auscultation and percussion with normal breathing effort HEART: regular rate & rhythm and no murmurs and no lower extremity edema ABDOMEN: abdomen soft, non-tender and normal bowel sounds MUSCULOSKELETAL: no cyanosis of digits and no clubbing  NEURO: alert & oriented x 3 with fluent speech, no focal motor/sensory deficits EXTREMITIES: No lower extremity edema  LABORATORY DATA:  I have reviewed the data as listed CMP Latest Ref Rng & Units 02/24/2019 02/03/2019 01/25/2019  Glucose 70 - 99 mg/dL 83 87 115(H)  BUN 8 - 23 mg/dL 18 10 13  Creatinine 0.44 - 1.00 mg/dL 0.72 0.74 0.82  Sodium 135 - 145 mmol/L 138 140 137  Potassium 3.5 - 5.1 mmol/L 3.8 3.7 3.9  Chloride 98 - 111 mmol/L 106 106 102  CO2 22 - 32 mmol/L 22 23 25  Calcium 8.9 - 10.3 mg/dL 9.3 9.3 9.2  Total Protein 6.5 - 8.1 g/dL 6.1(L) 6.4(L) 6.7  Total Bilirubin 0.3 - 1.2 mg/dL 0.3 0.3 0.3  Alkaline Phos 38 - 126 U/L 60 72 126  AST 15 - 41 U/L 18 25 20  ALT 0 - 44 U/L 18 25 26    Lab Results  Component Value Date   WBC 6.0 02/24/2019   HGB 9.3 (L) 02/24/2019   HCT 28.3 (L) 02/24/2019   MCV 93.1 02/24/2019   PLT 299 02/24/2019   NEUTROABS 4.0 02/24/2019    ASSESSMENT & PLAN:  Breast cancer of upper-inner quadrant of right female breast ([facility]) Right breast biopsy 2:00 6 cm from nipple: Grade 2-3 invasive ductal cancer, ER 90%, PR 2%, Ki-67 80%, HER-2 positive ratio 2.07; right biopsy 2:00 4 cm from nipple: IDC with DCIS, ER 95%, PR 10%, Ki-67 90%, HER-2 negative ratio 1.27 Breast MRI08/30/2017: 3 enhancing masses in the upper inner quadrant right breast spanning an area 3.7 cm (1.7 cm, 1 cm, 1.4 cm) no lymph node enlargement Clinical stage: T2N0 (stage 2A)  Treatment plan:  1. Genetic counseling: Normal did not reveal any abnormal mutations 2. Neoadjuvant chemotherapy. ([facility] Perjeta 6 cycles followed by Herceptin And Perjetamaintenance completed 10/20/2016) from 12/31/15 to 04/15/16 3. Right mastectomy with reconstruction: 05/19/2016:Right mastectomy: IDC grade 3, 1.3 cm, low-grade DCIS, LVIDS present, margins negative, 0/1 lymph node negative, ypT1CypN0 stage IA; repeat HER-2 negative ratio 1.41 4. Adjuvant antiestrogen therapy With letrozole 2.5 mg daily started March 2018 -------------------------------------------------------------------------------------------------------------------------------------------------- Breast cancer: Subcutaneous disease: 1.8 cm measured   by ultrasound Biopsy revealed grade 2-3 IDC ER 90% PR 0% HER-2 negative Ki-67 40% 11/30/2018 right lumpectomy ([place]): IDC, grade 3, 2.1cm, perineural invasion present, carcinoma present focally at the posterior margin and involving skeletal muscle. Oncotype score 52: Greater than 39% risk of distant recurrence with hormone therapy alone, chemo benefit greater than 15%  Recommendation: 1.Surgery to remove the tumor8/03/2019 2.Adjuvant chemotherapy withTaxotere and Cytoxan x6 cyclesstarted 12/30/2018 3.Adjuvant radiation 4.Follow-up adjuvant antiestrogen therapy ------------------------------------------------------------------------------------------------------------------------------------ Current treatment: Cycle4-day 1Taxotere and Cytoxan Labs reviewed  Chemo toxicities: 1.Mild body aches 2.Fatigue: Worsening with each treatment 3. Headaches: Probably due to Aloxi 4.Chemotherapy-induced anemia: Hemoglobin is 9.9 We will continue to watch and monitor this. 5.  Jugular vein DVT related to the port: Currently on blood thinners swelling has markedly improved.  Return to clinic in 3 weeks for cycle 5    No orders of the defined types were placed in this encounter.   The patient has a good understanding of the overall plan. she agrees with it. she will call with any problems that may develop before the next visit here.  Gudena, Vinay, MD 02/24/2019  I, Molly Dorshimer am acting as scribe for Dr. Vinay Gudena.  I have reviewed the above documentation for accuracy and completeness, and I agree with the above.       

## 2019-02-24 ENCOUNTER — Inpatient Hospital Stay: Payer: Medicare Other | Attending: Hematology and Oncology

## 2019-02-24 ENCOUNTER — Inpatient Hospital Stay: Payer: Medicare Other

## 2019-02-24 ENCOUNTER — Inpatient Hospital Stay (HOSPITAL_BASED_OUTPATIENT_CLINIC_OR_DEPARTMENT_OTHER): Payer: Medicare Other | Admitting: Hematology and Oncology

## 2019-02-24 ENCOUNTER — Encounter: Payer: Self-pay | Admitting: *Deleted

## 2019-02-24 ENCOUNTER — Other Ambulatory Visit: Payer: Self-pay

## 2019-02-24 DIAGNOSIS — Z5189 Encounter for other specified aftercare: Secondary | ICD-10-CM | POA: Diagnosis not present

## 2019-02-24 DIAGNOSIS — E860 Dehydration: Secondary | ICD-10-CM | POA: Diagnosis not present

## 2019-02-24 DIAGNOSIS — Z170 Estrogen receptor positive status [ER+]: Secondary | ICD-10-CM | POA: Insufficient documentation

## 2019-02-24 DIAGNOSIS — C50211 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 female breast: Secondary | ICD-10-CM

## 2019-02-24 DIAGNOSIS — Z5111 Encounter for antineoplastic chemotherapy: Secondary | ICD-10-CM | POA: Diagnosis not present

## 2019-02-24 DIAGNOSIS — Z95828 Presence of other vascular implants and grafts: Secondary | ICD-10-CM

## 2019-02-24 LAB — CBC WITH DIFFERENTIAL (CANCER CENTER ONLY)
Abs Immature Granulocytes: 0.03 10*3/uL (ref 0.00–0.07)
Basophils Absolute: 0.1 10*3/uL (ref 0.0–0.1)
Basophils Relative: 1 %
Eosinophils Absolute: 0 10*3/uL (ref 0.0–0.5)
Eosinophils Relative: 1 %
HCT: 28.3 % — ABNORMAL LOW (ref 36.0–46.0)
Hemoglobin: 9.3 g/dL — ABNORMAL LOW (ref 12.0–15.0)
Immature Granulocytes: 1 %
Lymphocytes Relative: 16 %
Lymphs Abs: 1 10*3/uL (ref 0.7–4.0)
MCH: 30.6 pg (ref 26.0–34.0)
MCHC: 32.9 g/dL (ref 30.0–36.0)
MCV: 93.1 fL (ref 80.0–100.0)
Monocytes Absolute: 1 10*3/uL (ref 0.1–1.0)
Monocytes Relative: 16 %
Neutro Abs: 4 10*3/uL (ref 1.7–7.7)
Neutrophils Relative %: 65 %
Platelet Count: 299 10*3/uL (ref 150–400)
RBC: 3.04 MIL/uL — ABNORMAL LOW (ref 3.87–5.11)
RDW: 17.4 % — ABNORMAL HIGH (ref 11.5–15.5)
WBC Count: 6 10*3/uL (ref 4.0–10.5)
nRBC: 0 % (ref 0.0–0.2)

## 2019-02-24 LAB — CMP (CANCER CENTER ONLY)
ALT: 18 U/L (ref 0–44)
AST: 18 U/L (ref 15–41)
Albumin: 3.7 g/dL (ref 3.5–5.0)
Alkaline Phosphatase: 60 U/L (ref 38–126)
Anion gap: 10 (ref 5–15)
BUN: 18 mg/dL (ref 8–23)
CO2: 22 mmol/L (ref 22–32)
Calcium: 9.3 mg/dL (ref 8.9–10.3)
Chloride: 106 mmol/L (ref 98–111)
Creatinine: 0.72 mg/dL (ref 0.44–1.00)
GFR, Est AFR Am: >60 mL/min (ref >60)
GFR, Estimated: >60 mL/min (ref >60)
Glucose, Bld: 83 mg/dL (ref 70–99)
Potassium: 3.8 mmol/L (ref 3.5–5.1)
Sodium: 138 mmol/L (ref 135–145)
Total Bilirubin: 0.3 mg/dL (ref 0.3–1.2)
Total Protein: 6.1 g/dL — ABNORMAL LOW (ref 6.5–8.1)

## 2019-02-24 MED ORDER — SODIUM CHLORIDE 0.9% FLUSH
10.0000 mL | INTRAVENOUS | Status: DC | PRN
  Administered 2019-02-24: 10 mL
  Filled 2019-02-24: qty 10

## 2019-02-24 MED ORDER — SODIUM CHLORIDE 0.9% FLUSH
10.0000 mL | INTRAVENOUS | Status: DC | PRN
  Administered 2019-02-24: 10 mL via INTRAVENOUS
  Filled 2019-02-24: qty 10

## 2019-02-24 MED ORDER — HEPARIN SOD (PORK) LOCK FLUSH 100 UNIT/ML IV SOLN
500.0000 [IU] | Freq: Once | INTRAVENOUS | Status: AC | PRN
  Administered 2019-02-24: 500 [IU]
  Filled 2019-02-24: qty 5

## 2019-02-24 MED ORDER — PEGFILGRASTIM 6 MG/0.6ML ~~LOC~~ PSKT
6.0000 mg | PREFILLED_SYRINGE | Freq: Once | SUBCUTANEOUS | Status: AC
  Administered 2019-02-24: 6 mg via SUBCUTANEOUS

## 2019-02-24 MED ORDER — SODIUM CHLORIDE 0.9 % IV SOLN
Freq: Once | INTRAVENOUS | Status: AC
  Administered 2019-02-24: 12:00:00 via INTRAVENOUS
  Filled 2019-02-24: qty 250

## 2019-02-24 MED ORDER — PALONOSETRON HCL INJECTION 0.25 MG/5ML
INTRAVENOUS | Status: AC
  Filled 2019-02-24: qty 5

## 2019-02-24 MED ORDER — PALONOSETRON HCL INJECTION 0.25 MG/5ML
0.2500 mg | Freq: Once | INTRAVENOUS | Status: AC
  Administered 2019-02-24: 0.25 mg via INTRAVENOUS

## 2019-02-24 MED ORDER — SODIUM CHLORIDE 0.9 % IV SOLN
75.0000 mg/m2 | Freq: Once | INTRAVENOUS | Status: AC
  Administered 2019-02-24: 130 mg via INTRAVENOUS
  Filled 2019-02-24: qty 13

## 2019-02-24 MED ORDER — SODIUM CHLORIDE 0.9 % IV SOLN
600.0000 mg/m2 | Freq: Once | INTRAVENOUS | Status: AC
  Administered 2019-02-24: 1060 mg via INTRAVENOUS
  Filled 2019-02-24: qty 53

## 2019-02-24 MED ORDER — SODIUM CHLORIDE 0.9 % IV SOLN
Freq: Once | INTRAVENOUS | Status: AC
  Administered 2019-02-24: 12:00:00 via INTRAVENOUS
  Filled 2019-02-24: qty 5

## 2019-02-24 MED ORDER — PEGFILGRASTIM 6 MG/0.6ML ~~LOC~~ PSKT
PREFILLED_SYRINGE | SUBCUTANEOUS | Status: AC
  Filled 2019-02-24: qty 0.6

## 2019-02-24 NOTE — Patient Instructions (Addendum)
[facility] Discharge Instructions for Patients Receiving Chemotherapy  Today you received the following chemotherapy agents Docetaxel (TAXOTERE) & Cyclophosphamide (CYTOXAN).  To help prevent nausea and vomiting after your treatment, we encourage you to take your nausea medication as prescribed.   If you develop nausea and vomiting that is not controlled by your nausea medication, call the clinic.   BELOW ARE SYMPTOMS THAT SHOULD BE REPORTED IMMEDIATELY:  *FEVER GREATER THAN 100.5 F  *CHILLS WITH OR WITHOUT FEVER  NAUSEA AND VOMITING THAT IS NOT CONTROLLED WITH YOUR NAUSEA MEDICATION  *UNUSUAL SHORTNESS OF BREATH  *UNUSUAL BRUISING OR BLEEDING  TENDERNESS IN MOUTH AND THROAT WITH OR WITHOUT PRESENCE OF ULCERS  *URINARY PROBLEMS  *BOWEL PROBLEMS  UNUSUAL RASH Items with * indicate a potential emergency and should be followed up as soon as possible.  Feel free to call the clinic should you have any questions or concerns. The clinic phone number is [phone].  Please show the [facility] at check-in to the Emergency Department and triage nurse.  Pegfilgrastim injection What is this medicine? PEGFILGRASTIM (PEG fil gra stim) is a long-acting granulocyte colony-stimulating factor that stimulates the growth of neutrophils, a type of white blood cell important in the body's fight against infection. It is used to reduce the incidence of fever and infection in patients with certain types of cancer who are receiving chemotherapy that affects the bone marrow, and to increase survival after being exposed to high doses of radiation. This medicine may be used for other purposes; ask your health care provider or pharmacist if you have questions. COMMON BRAND NAME(S): Steve Rattler, Ziextenzo What should I tell my health care provider before I take this medicine? They need to know if you have any of these conditions:  kidney disease  latex  allergy  ongoing radiation therapy  sickle cell disease  skin reactions to acrylic adhesives (On-Body Injector only)  an unusual or allergic reaction to pegfilgrastim, filgrastim, other medicines, foods, dyes, or preservatives  pregnant or trying to get pregnant  breast-feeding How should I use this medicine? This medicine is for injection under the skin. If you get this medicine at home, you will be taught how to prepare and give the pre-filled syringe or how to use the On-body Injector. Refer to the patient Instructions for Use for detailed instructions. Use exactly as directed. Tell your healthcare provider immediately if you suspect that the On-body Injector may not have performed as intended or if you suspect the use of the On-body Injector resulted in a missed or partial dose. It is important that you put your used needles and syringes in a special sharps container. Do not put them in a trash can. If you do not have a sharps container, call your pharmacist or healthcare provider to get one. Talk to your pediatrician regarding the use of this medicine in children. While this drug may be prescribed for selected conditions, precautions do apply. Overdosage: If you think you have taken too much of this medicine contact a poison control center or emergency room at once. NOTE: This medicine is only for you. Do not share this medicine with others. What if I miss a dose? It is important not to miss your dose. Call your doctor or health care professional if you miss your dose. If you miss a dose due to an On-body Injector failure or leakage, a new dose should be administered as soon as possible using a single prefilled syringe for manual  use. What may interact with this medicine? Interactions have not been studied. Give your health care provider a list of all the medicines, herbs, non-prescription drugs, or dietary supplements you use. Also tell them if you smoke, drink alcohol, or use illegal  drugs. Some items may interact with your medicine. This list may not describe all possible interactions. Give your health care provider a list of all the medicines, herbs, non-prescription drugs, or dietary supplements you use. Also tell them if you smoke, drink alcohol, or use illegal drugs. Some items may interact with your medicine. What should I watch for while using this medicine? You may need blood work done while you are taking this medicine. If you are going to need a MRI, CT scan, or other procedure, tell your doctor that you are using this medicine (On-Body Injector only). What side effects may I notice from receiving this medicine? Side effects that you should report to your doctor or health care professional as soon as possible:  allergic reactions like skin rash, itching or hives, swelling of the face, lips, or tongue  back pain  dizziness  fever  pain, redness, or irritation at site where injected  pinpoint red spots on the skin  red or dark-brown urine  shortness of breath or breathing problems  stomach or side pain, or pain at the shoulder  swelling  tiredness  trouble passing urine or change in the amount of urine Side effects that usually do not require medical attention (report to your doctor or health care professional if they continue or are bothersome):  bone pain  muscle pain This list may not describe all possible side effects. Call your doctor for medical advice about side effects. You may report side effects to FDA at 1-800-FDA-1088. Where should I keep my medicine? Keep out of the reach of children. If you are using this medicine at home, you will be instructed on how to store it. Throw away any unused medicine after the expiration date on the label. NOTE: This sheet is a summary. It may not cover all possible information. If you have questions about this medicine, talk to your doctor, pharmacist, or health care provider.  2020 Elsevier/Gold  Standard (2017-07-12 16:57:08)  Coronavirus (COVID-19) Are you at risk?  Are you at risk for the Coronavirus (COVID-19)?  To be considered HIGH RISK for Coronavirus (COVID-19), you have to meet the following criteria:  . Traveled to [place], [place], [place], [place] or [place]; or in the [place] to [place], [place], [place], or [place]; and have fever, cough, and shortness of breath within the last 2 weeks of travel OR . Been in close contact with a person diagnosed with COVID-19 within the last 2 weeks and have fever, cough, and shortness of breath . IF YOU DO NOT MEET THESE CRITERIA, YOU ARE CONSIDERED LOW RISK FOR COVID-19.  What to do if you are HIGH RISK for COVID-19?  Marland Kitchen If you are having a medical emergency, call 911. . Seek medical care right away. Before you go to a doctor's office, urgent care or emergency department, call ahead and tell them about your recent travel, contact with someone diagnosed with COVID-19, and your symptoms. You should receive instructions from your physician's office regarding next steps of care.  . When you arrive at healthcare provider, tell the healthcare staff immediately you have returned from visiting [place], [place], [place], [place] or [place]; or traveled in the [place] to [place], [place], [place],  or [place]; in the last two weeks or you have been in close contact with a person diagnosed with COVID-19 in the last 2 weeks.   . Tell the health care staff about your symptoms: fever, cough and shortness of breath. . After you have been seen by a medical provider, you will be either: o Tested for (COVID-19) and discharged home on quarantine except to seek medical care if symptoms worsen, and asked to  - Stay home and avoid contact with others until you get your results (4-5 days)  - Avoid travel on public transportation if possible (such as bus, train, or airplane) or o Sent to the Emergency Department by EMS for evaluation,  COVID-19 testing, and possible admission depending on your condition and test results.  What to do if you are LOW RISK for COVID-19?  Reduce your risk of any infection by using the same precautions used for avoiding the common cold or flu:  Marland Kitchen Wash your hands often with soap and warm water for at least 20 seconds.  If soap and water are not readily available, use an alcohol-based hand sanitizer with at least 60% alcohol.  . If coughing or sneezing, cover your mouth and nose by coughing or sneezing into the elbow areas of your shirt or coat, into a tissue or into your sleeve (not your hands). . Avoid shaking hands with others and consider head nods or verbal greetings only. . Avoid touching your eyes, nose, or mouth with unwashed hands.  . Avoid close contact with people who are sick. . Avoid places or events with large numbers of people in one location, like concerts or sporting events. . Carefully consider travel plans you have or are making. . If you are planning any travel outside or inside the [place], visit the CDC's Travelers' Health webpage for the latest health notices. . If you have some symptoms but not all symptoms, continue to monitor at home and seek medical attention if your symptoms worsen. . If you are having a medical emergency, call 911.   [facility] / e-Visit: [email or web address]         [facility]: [facility]: [phone]                   [facility]: [phone]

## 2019-02-24 NOTE — Patient Instructions (Signed)

## 2019-02-24 NOTE — Assessment & Plan Note (Signed)
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 Genetic counseling: Normal did not reveal any abnormal mutations 2. Neoadjuvant chemotherapy. [facility] 6 cycles followed by Herceptin And Perjetamaintenance completed 10/20/2016) from 12/31/15 to 04/15/16 3. Right mastectomy with reconstruction: 05/19/2016:Right mastectomy: IDC grade 3, 1.3 cm, low-grade DCIS, LVIDS present, margins negative, 0/1 lymph node negative, [id number] stage IA; repeat HER-2 negative ratio 1.41 4. Adjuvant antiestrogen therapy With letrozole 2.5 mg daily started March 2018 -------------------------------------------------------------------------------------------------------------------------------------------------- Breast cancer: Subcutaneous disease: 1.8 cm measured by ultrasound Biopsy revealed grade 2-3 IDC ER 90% PR 0% HER-2 negative Ki-67 40% 11/30/2018 right lumpectomy Patricia Chandler): IDC, grade 3, 2.1cm, perineural invasion present, carcinoma present focally at the posterior margin and involving skeletal muscle. Oncotype score 52: Greater than 39% risk of distant recurrence with hormone therapy alone, chemo benefit greater than 15%  Recommendation: 1.Surgery to remove the tumor8/03/2019 2.Adjuvant chemotherapy withTaxotere and Cytoxan x6 cyclesstarted 12/30/2018 3.Adjuvant radiation 4.Follow-up adjuvant antiestrogen therapy ------------------------------------------------------------------------------------------------------------------------------------ Current treatment: Cycle4-day 1Taxotere and Cytoxan Labs  reviewed  Chemo toxicities: 1.Mild body aches 2.Fatigue: Worsening with each treatment 3. Headaches: Probably due to [place] 4.Chemotherapy-induced anemia: Hemoglobin is 9.9 We will continue to watch and monitor this. 5.  Jugular vein DVT related to the port: Currently on blood thinners swelling has markedly improved.  Return to clinic in 3 weeks for cycle 5

## 2019-03-01 ENCOUNTER — Other Ambulatory Visit: Payer: Self-pay

## 2019-03-01 ENCOUNTER — Inpatient Hospital Stay: Payer: Medicare Other

## 2019-03-01 VITALS — BP 108/68 | HR 96 | Temp 98.2°F | Resp 16

## 2019-03-01 DIAGNOSIS — Z170 Estrogen receptor positive status [ER+]: Secondary | ICD-10-CM

## 2019-03-01 DIAGNOSIS — Z5189 Encounter for other specified aftercare: Secondary | ICD-10-CM | POA: Diagnosis not present

## 2019-03-01 DIAGNOSIS — E860 Dehydration: Secondary | ICD-10-CM | POA: Diagnosis not present

## 2019-03-01 DIAGNOSIS — Z5111 Encounter for antineoplastic chemotherapy: Secondary | ICD-10-CM | POA: Diagnosis not present

## 2019-03-01 DIAGNOSIS — Z95828 Presence of other vascular implants and grafts: Secondary | ICD-10-CM

## 2019-03-01 DIAGNOSIS — C50211 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 female breast: Secondary | ICD-10-CM

## 2019-03-01 MED ORDER — HEPARIN SOD (PORK) LOCK FLUSH 100 UNIT/ML IV SOLN
500.0000 [IU] | Freq: Once | INTRAVENOUS | Status: AC
  Administered 2019-03-01: 500 [IU] via INTRAVENOUS
  Filled 2019-03-01: qty 5

## 2019-03-01 MED ORDER — SODIUM CHLORIDE 0.9% FLUSH
10.0000 mL | INTRAVENOUS | Status: DC | PRN
  Administered 2019-03-01: 15:00:00 10 mL via INTRAVENOUS
  Filled 2019-03-01: qty 10

## 2019-03-01 MED ORDER — SODIUM CHLORIDE 0.9 % IV SOLN
Freq: Once | INTRAVENOUS | Status: AC
  Administered 2019-03-01: 13:00:00 via INTRAVENOUS
  Filled 2019-03-01: qty 250

## 2019-03-01 NOTE — Patient Instructions (Signed)

## 2019-03-02 ENCOUNTER — Encounter (HOSPITAL_COMMUNITY): Payer: Medicare Other | Admitting: Internal Medicine

## 2019-03-02 ENCOUNTER — Other Ambulatory Visit (HOSPITAL_COMMUNITY): Payer: Medicare Other

## 2019-03-03 ENCOUNTER — Ambulatory Visit: Payer: Medicare Other

## 2019-03-07 ENCOUNTER — Telehealth: Payer: Self-pay | Admitting: Hematology and Oncology

## 2019-03-07 NOTE — Telephone Encounter (Signed)
Returned patient's phone call regarding cancelling 11/18 appointment, per patient's request appointment has been cancelled.   Message to provider.

## 2019-03-08 ENCOUNTER — Inpatient Hospital Stay: Payer: Medicare Other

## 2019-03-08 ENCOUNTER — Ambulatory Visit: Payer: Medicare Other | Admitting: Psychology

## 2019-03-10 ENCOUNTER — Ambulatory Visit: Payer: Medicare Other

## 2019-03-13 ENCOUNTER — Other Ambulatory Visit: Payer: Self-pay

## 2019-03-13 NOTE — Patient Outreach (Signed)
[facility]) Care Management  03/13/2019  Markesha Mittleman Jan 28, 1956 [id number]   Medication Adherence call to Mrs. Lurlean Horns HIPPA Compliant Voice message left with a call back number. Mrs. Annie Main is showing past due on Losartan 25 mg under [facility].   [facility] Direct Dial [phone]  Fax [phone] [email or web address]

## 2019-03-14 ENCOUNTER — Ambulatory Visit: Payer: Medicare Other | Admitting: Psychology

## 2019-03-16 NOTE — Progress Notes (Signed)
Patient Care Team: [name], MD as PCP - General (Family Medicine)  DIAGNOSIS:    ICD-10-CM   1. 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 breast in female, estrogen receptor positive ([place])  C50.211    Z17.0     SUMMARY OF ONCOLOGIC HISTORY: Oncology History  Breast cancer of upper-inner quadrant of right female breast ([place])  12/05/2015 Mammogram   Right breast mass 2:00 4 cm from nipple: 1.7 cm, T1c N0 stage IA; right breast 2:00 6 cm from nipple 1.3 cm mass, 7 mm satellite nodule between the 2 masses, T1 cN0 stage IA   12/06/2015 Initial Diagnosis   Right breast biopsy 2:00 6 cm from nipple: Grade 2-3 invasive ductal cancer, ER 90%, PR 2%, Ki-67 80%, HER-2 positive ratio 2.07; right biopsy 2:00 4 cm from nipple: IDC with DCIS, ER 95%, PR 10%, Ki-67 90%, HER-2 negative ratio 1.27   12/18/2015 Breast MRI   3 enhancing masses in the upper inner quadrant right breast spanning an area 3.7 cm (1.7 cm, 1 cm, 1.4 cm) no lymph node enlargement   12/31/2015 - 04/15/2016 Neo-Adjuvant Chemotherapy   [facility] Perjeta 6 cycles followed by Herceptin, Perjeta maintenance for 1 year completed 10/20/2016   01/08/2016 - 01/12/2016 Hospital Admission   Neutropenic fever hospitalization (because patient did not receive Neulasta with cycle 1)   01/10/2016 Miscellaneous   Genetic testing was normal did not reveal any mutations   04/16/2016 Breast MRI   Near CR to therapy. Previous 2 biopsied massses are no longer seen. Third satellite lesion is smaller from 10 mm to 5.3 mm, no abnormal LN.   05/19/2016 Surgery   Right mastectomy: IDC grade 3, 1.3 cm, low-grade DCIS, LVIDS present, margins negative, 0/1 lymph node negative, ypT1CypN0 stage IA; repeat HER-2 negative ratio 1.41   05/19/2016 Surgery   Right breast reconstruction with tissue expander. Acellular dermis for breast reconstruction (Dr.Thimappa)    06/22/2016 -  Anti-estrogen oral therapy   Letrozole 2.5 mg daily   11/01/2018  Relapse/Recurrence   Patient palpated lump in the UIQ at surgical scar of reconstructed right breast. US of the right breast showed a 1.8cm mass suspicious for recurrent breast cancer. Biopsy showed grade 2 invasive ductal carcinoma, HER-2 negative (1+), ER 90%, PR negative, Ki67 40%.   11/11/2018 Oncotype testing   Oncotype score 52: Greater than 39% risk of distant recurrence with hormone therapy alone, chemo benefit greater than 15%   11/30/2018 Surgery   Right lumpectomy [name]): IDC, grade 3, 2.1cm, perineural invasion present, carcinoma present focally at the posterior margin and involving skeletal muscle.   11/30/2018 Cancer Staging   Staging form: Breast, AJCC 8th Edition - Pathologic stage from 11/30/2018: Stage IIA (pT2, pN0, cM0, G3, ER+, PR-, HER2-) - Signed by Gardenia Phlegm, NP on 12/14/2018   12/23/2018 -  Chemotherapy   The patient had palonosetron (ALOXI) injection 0.25 mg, 0.25 mg, Intravenous,  Once, 7 of 9 cycles Administration: 0.25 mg (12/23/2018), 0.25 mg (01/13/2019), 0.25 mg (02/03/2019), 0.25 mg (02/24/2019) pegfilgrastim (NEULASTA ONPRO KIT) injection 6 mg, 6 mg, Subcutaneous, Once, 7 of 9 cycles Administration: 6 mg (12/23/2018), 6 mg (01/13/2019), 6 mg (02/03/2019), 6 mg (02/24/2019) cyclophosphamide (CYTOXAN) 1,060 mg in sodium chloride 0.9 % 250 mL chemo infusion, 600 mg/m2 = 1,060 mg, Intravenous,  Once, 7 of 9 cycles Administration: 1,060 mg (12/23/2018), 1,060 mg (01/13/2019), 1,060 mg (02/03/2019), 1,060 mg (02/24/2019) DOCEtaxel (TAXOTERE) 130 mg in sodium chloride 0.9 % 250 mL chemo infusion, 75  mg/m2 = 130 mg, Intravenous,  Once, 7 of 9 cycles Administration: 130 mg (12/23/2018), 130 mg (01/13/2019), 130 mg (02/03/2019), 130 mg (02/24/2019) fosaprepitant (EMEND) 150 mg in sodium chloride 0.9 % 145 mL IVPB, , Intravenous,  Once, 7 of 9 cycles Administration:  (12/23/2018),  (02/03/2019),  (01/13/2019),  (02/24/2019)  for chemotherapy treatment.      CHIEF  COMPLIANT: Cycle5Taxotere and Cytoxan  INTERVAL HISTORY: Patricia Chandler is a 63 y.o. with above-mentioned history of recurrent right breast cancerwho underwent a lumpectomy and is currently on adjuvant chemotherapy with Taxotere and Cytoxan.She presents to the clinic todayforcycle5. Her major complaint today is severe fatigue.  She was in bed for 2 weeks after last chemo and over the last week she has felt much better.  But because she has been overdoing it she has felt more short of breath.  Denied any nausea or vomiting.  Denies neuropathy.  REVIEW OF SYSTEMS:   Constitutional: Denies fevers, chills or abnormal weight loss Eyes: Denies blurriness of vision Ears, nose, mouth, throat, and face: Denies mucositis or sore throat Respiratory: Shortness of breath with exertion Cardiovascular: Denies palpitation, chest discomfort Gastrointestinal: Denies nausea, heartburn or change in bowel habits Skin: Denies abnormal skin rashes Lymphatics: Denies new lymphadenopathy or easy bruising Neurological: Denies numbness, tingling or new weaknesses Behavioral/Psych: Mood is stable, no new changes  Extremities: No lower extremity edema Breast: denies any pain or lumps or nodules in either breasts All other systems were reviewed with the patient and are negative.  I have reviewed the past medical history, past surgical history, social history and family history with the patient and they are unchanged from previous note.  ALLERGIES:  has No Known Allergies.  MEDICATIONS:  Current Outpatient Medications  Medication Sig Dispense Refill  . Biotin 1 MG CAPS Take by mouth.    . calcium-vitamin D (OSCAL WITH D) 500-200 MG-UNIT tablet Take 1 tablet by mouth daily with breakfast.    . cyclophosphamide in sodium chloride 0.9 % 250 mL Inject into the vein as directed. Once every three weeks  Not sure of dose    . denosumab (PROLIA) 60 MG/ML SOLN injection Inject 60 mg into the skin every 6  (six) months. Administer in upper arm, thigh, or abdomen    . dexamethasone (DECADRON) 4 MG tablet Take 1 tablet (4 mg total) by mouth 2 (two) times daily. Take 1 tablet day before chemo and 1 tablet day after chemo with food 8 tablet 0  . dicyclomine (BENTYL) 10 MG capsule Take 1 capsule (10 mg total) by mouth 4 (four) times daily -  before meals and at bedtime. (Patient taking differently: Take 10 mg by mouth as needed. ) 60 capsule 1  . DOCEtaxel (TAXOTERE IV) Inject into the vein as directed. Once every three weeks    . Lactobacillus (PROBIOTIC ACIDOPHILUS PO) Take 1 capsule by mouth daily.     Marland Kitchen lidocaine-prilocaine (EMLA) cream Apply to affected area once 30 g 3  . LORazepam (ATIVAN) 0.5 MG tablet Take 1 tablet (0.5 mg total) by mouth at bedtime as needed for sleep. 30 tablet 3  . omeprazole (PRILOSEC) 20 MG capsule Take 20 mg by mouth daily as needed (heartburn).    . ondansetron (ZOFRAN) 8 MG tablet Take 1 tablet (8 mg total) by mouth 2 (two) times daily as needed for refractory nausea / vomiting. Start on day 3 after chemo. 30 tablet 1  . oxyCODONE-acetaminophen (PERCOCET/ROXICET) 5-325 MG tablet Take 1 tablet by mouth  every 6 (six) hours as needed for severe pain. 30 tablet 0  . prochlorperazine (COMPAZINE) 10 MG tablet Take 1 tablet (10 mg total) by mouth every 6 (six) hours as needed (Nausea or vomiting). 30 tablet 1  . Rivaroxaban 15 & 20 MG TBPK Follow package directions: Take one 80m tablet by mouth twice a day. On day 22, switch to one 235mtablet once a day. Take with food. 51 each 0  . zolpidem (AMBIEN) 5 MG tablet Take 1 tablet (5 mg total) by mouth at bedtime. 30 tablet 0   No current facility-administered medications for this visit.    Facility-Administered Medications Ordered in Other Visits  Medication Dose Route Frequency Provider Last Rate Last Dose  . sodium chloride flush (NS) 0.9 % injection 10 mL  10 mL Intravenous PRN GuNicholas LoseMD   10 mL at 03/17/19 0936     PHYSICAL EXAMINATION: ECOG PERFORMANCE STATUS: 1 - Symptomatic but completely ambulatory  There were no vitals filed for this visit. There were no vitals filed for this visit.  GENERAL: alert, no distress and comfortable SKIN: skin color, texture, turgor are normal, no rashes or significant lesions EYES: normal, Conjunctiva are pink and non-injected, sclera clear OROPHARYNX: no exudate, no erythema and lips, buccal mucosa, and tongue normal  NECK: supple, thyroid normal size, non-tender, without nodularity LYMPH: no palpable lymphadenopathy in the cervical, axillary or inguinal LUNGS: clear to auscultation and percussion with normal breathing effort HEART: regular rate & rhythm and no murmurs and no lower extremity edema ABDOMEN: abdomen soft, non-tender and normal bowel sounds MUSCULOSKELETAL: no cyanosis of digits and no clubbing  NEURO: alert & oriented x 3 with fluent speech, no focal motor/sensory deficits EXTREMITIES: No lower extremity edema  LABORATORY DATA:  I have reviewed the data as listed CMP Latest Ref Rng & Units 02/24/2019 02/03/2019 01/25/2019  Glucose 70 - 99 mg/dL 83 87 115(H)  BUN 8 - 23 mg/dL _0 Creatinine 0.44 - 1.00 mg/dL 0.72 0.74 0.82  Sodium 135 - 145 mmol/L 138 140 137  Potassium 3.5 - 5.1 mmol/L 3.8 3.7 3.9  Chloride 98 - 111 mmol/L 106 106 102  CO2 22 - 32 mmol/L _1 Calcium 8.9 - 10.3 mg/dL 9.3 9.3 9.2  Total Protein 6.5 - 8.1 g/dL 6.1(L) 6.4(L) 6.7  Total Bilirubin 0.3 - 1.2 mg/dL 0.3 0.3 0.3  Alkaline Phos 38 - 126 U/L 60 72 126  AST 15 - 41 U/L _2 ALT 0 - 44 U/L _3 Lab Results  Component Value Date   WBC 6.0 02/24/2019   HGB 9.3 (L) 02/24/2019   HCT 28.3 (L) 02/24/2019   MCV 93.1 02/24/2019   PLT 299 02/24/2019   NEUTROABS 4.0 02/24/2019    ASSESSMENT & PLAN:  Breast cancer of upper-inner quadrant of right female breast ([facility]) Right breast biopsy 2:00 6 cm from nipple: Grade 2-3 invasive ductal cancer, ER  90%, PR 2%, Ki-67 80%, HER-2 positive ratio 2.07; right biopsy 2:00 4 cm from nipple: IDC with DCIS, ER 95%, PR 10%, Ki-67 90%, HER-2 negative ratio 1.27 Breast MRI08/30/2017: 3 enhancing masses in the upper inner quadrant right breast spanning an area 3.7 cm (1.7 cm, 1 cm, 1.4 cm) no lymph node enlargement Clinical stage: T2N0 (stage 2A)  Treatment plan: 1. Genetic counseling: Normal did not reveal any abnormal mutations 2. [facility] by Herceptin And Perjetamaintenance completed 10/20/2016)  from 12/31/15 to 04/15/16 3. Right mastectomy with reconstruction: 05/19/2016:Right mastectomy: IDC grade 3, 1.3 cm, low-grade DCIS, LVIDS present, margins negative, 0/1 lymph node negative, ypT1CypN0 stage IA; repeat HER-2 negative ratio 1.41 4. Adjuvant antiestrogen therapy With letrozole 2.5 mg daily started March 2018 -------------------------------------------------------------------------------------------------------------------------------------------------- Breast cancer: Subcutaneous disease: 1.8 cm measured by ultrasound Biopsy revealed grade 2-3 IDC ER 90% PR 0% HER-2 negative Ki-67 40% 11/30/2018 right lumpectomy [name]): IDC, grade 3, 2.1cm, perineural invasion present, carcinoma present focally at the posterior margin and involving skeletal muscle. Oncotype score 52: Greater than 39% risk of distant recurrence with hormone therapy alone, chemo benefit greater than 15%  Recommendation: 1.Surgery to remove the tumor8/03/2019 2.Adjuvant chemotherapy withTaxotere and Cytoxan x6 cyclesstarted 12/30/2018 3.Adjuvant radiation 4.Follow-up adjuvant antiestrogen therapy ------------------------------------------------------------------------------------------------------------------------------------ Current treatment: Cycle5-day 1Taxotere and Cytoxan Labs reviewed  Chemo toxicities: 1.Mild body aches 2.Fatigue: Worsening with  each treatment 3. Headaches: Probably due to Aloxi 4.Chemotherapy-induced anemia: Hemoglobin is9.9 We will continue to watch and monitor this. 5.Jugular vein DVT related to the port: Currently on blood thinners swelling has markedly improved. 6.  Shortness of breath to exertion: Due to deconditioning from weight loss and fatigue  We will adjust the dosage to her current weight today. Return to clinic in 3 weeks for cycle 6    No orders of the defined types were placed in this encounter.  The patient has a good understanding of the overall plan. she agrees with it. she will call with any problems that may develop before the next visit here.  Nicholas Lose, MD 03/17/2019  Julious Oka Dorshimer, am acting as scribe for Dr. Nicholas Lose.  I have reviewed the above documentation for accuracy and completeness, and I agree with the above.

## 2019-03-17 ENCOUNTER — Inpatient Hospital Stay (HOSPITAL_BASED_OUTPATIENT_CLINIC_OR_DEPARTMENT_OTHER): Payer: Medicare Other | Admitting: Hematology and Oncology

## 2019-03-17 ENCOUNTER — Inpatient Hospital Stay: Payer: Medicare Other

## 2019-03-17 ENCOUNTER — Other Ambulatory Visit: Payer: Self-pay

## 2019-03-17 VITALS — HR 98

## 2019-03-17 DIAGNOSIS — C50211 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 female breast: Secondary | ICD-10-CM

## 2019-03-17 DIAGNOSIS — Z95828 Presence of other vascular implants and grafts: Secondary | ICD-10-CM

## 2019-03-17 DIAGNOSIS — Z170 Estrogen receptor positive status [ER+]: Secondary | ICD-10-CM | POA: Diagnosis not present

## 2019-03-17 DIAGNOSIS — E860 Dehydration: Secondary | ICD-10-CM | POA: Diagnosis not present

## 2019-03-17 DIAGNOSIS — Z5189 Encounter for other specified aftercare: Secondary | ICD-10-CM | POA: Diagnosis not present

## 2019-03-17 DIAGNOSIS — Z5111 Encounter for antineoplastic chemotherapy: Secondary | ICD-10-CM | POA: Diagnosis not present

## 2019-03-17 LAB — CBC WITH DIFFERENTIAL (CANCER CENTER ONLY)
Abs Immature Granulocytes: 0.02 10*3/uL (ref 0.00–0.07)
Basophils Absolute: 0.1 10*3/uL (ref 0.0–0.1)
Basophils Relative: 2 %
Eosinophils Absolute: 0.2 10*3/uL (ref 0.0–0.5)
Eosinophils Relative: 3 %
HCT: 31.1 % — ABNORMAL LOW (ref 36.0–46.0)
Hemoglobin: 10 g/dL — ABNORMAL LOW (ref 12.0–15.0)
Immature Granulocytes: 0 %
Lymphocytes Relative: 14 %
Lymphs Abs: 0.7 10*3/uL (ref 0.7–4.0)
MCH: 30.5 pg (ref 26.0–34.0)
MCHC: 32.2 g/dL (ref 30.0–36.0)
MCV: 94.8 fL (ref 80.0–100.0)
Monocytes Absolute: 0.7 10*3/uL (ref 0.1–1.0)
Monocytes Relative: 14 %
Neutro Abs: 3.6 10*3/uL (ref 1.7–7.7)
Neutrophils Relative %: 67 %
Platelet Count: 330 10*3/uL (ref 150–400)
RBC: 3.28 MIL/uL — ABNORMAL LOW (ref 3.87–5.11)
RDW: 17.4 % — ABNORMAL HIGH (ref 11.5–15.5)
WBC Count: 5.3 10*3/uL (ref 4.0–10.5)
nRBC: 0 % (ref 0.0–0.2)

## 2019-03-17 LAB — CMP (CANCER CENTER ONLY)
ALT: 17 U/L (ref 0–44)
AST: 19 U/L (ref 15–41)
Albumin: 3.6 g/dL (ref 3.5–5.0)
Alkaline Phosphatase: 63 U/L (ref 38–126)
Anion gap: 10 (ref 5–15)
BUN: 12 mg/dL (ref 8–23)
CO2: 24 mmol/L (ref 22–32)
Calcium: 9 mg/dL (ref 8.9–10.3)
Chloride: 107 mmol/L (ref 98–111)
Creatinine: 0.75 mg/dL (ref 0.44–1.00)
GFR, Est AFR Am: >60 mL/min (ref >60)
GFR, Estimated: >60 mL/min (ref >60)
Glucose, Bld: 96 mg/dL (ref 70–99)
Potassium: 4 mmol/L (ref 3.5–5.1)
Sodium: 141 mmol/L (ref 135–145)
Total Bilirubin: 0.3 mg/dL (ref 0.3–1.2)
Total Protein: 5.8 g/dL — ABNORMAL LOW (ref 6.5–8.1)

## 2019-03-17 MED ORDER — PEGFILGRASTIM 6 MG/0.6ML ~~LOC~~ PSKT
PREFILLED_SYRINGE | SUBCUTANEOUS | Status: AC
  Filled 2019-03-17: qty 0.6

## 2019-03-17 MED ORDER — HEPARIN SOD (PORK) LOCK FLUSH 100 UNIT/ML IV SOLN
500.0000 [IU] | Freq: Once | INTRAVENOUS | Status: AC | PRN
  Administered 2019-03-17: 500 [IU]
  Filled 2019-03-17: qty 5

## 2019-03-17 MED ORDER — PALONOSETRON HCL INJECTION 0.25 MG/5ML
INTRAVENOUS | Status: AC
  Filled 2019-03-17: qty 5

## 2019-03-17 MED ORDER — SODIUM CHLORIDE 0.9 % IV SOLN
75.0000 mg/m2 | Freq: Once | INTRAVENOUS | Status: AC
  Administered 2019-03-17: 130 mg via INTRAVENOUS
  Filled 2019-03-17: qty 13

## 2019-03-17 MED ORDER — PEGFILGRASTIM 6 MG/0.6ML ~~LOC~~ PSKT
6.0000 mg | PREFILLED_SYRINGE | Freq: Once | SUBCUTANEOUS | Status: AC
  Administered 2019-03-17: 6 mg via SUBCUTANEOUS

## 2019-03-17 MED ORDER — SODIUM CHLORIDE 0.9% FLUSH
10.0000 mL | INTRAVENOUS | Status: DC | PRN
  Administered 2019-03-17: 10 mL
  Filled 2019-03-17: qty 10

## 2019-03-17 MED ORDER — SODIUM CHLORIDE 0.9 % IV SOLN
Freq: Once | INTRAVENOUS | Status: AC
  Administered 2019-03-17: 11:00:00 via INTRAVENOUS
  Filled 2019-03-17: qty 5

## 2019-03-17 MED ORDER — SODIUM CHLORIDE 0.9% FLUSH
10.0000 mL | INTRAVENOUS | Status: DC | PRN
  Administered 2019-03-17: 10 mL via INTRAVENOUS
  Filled 2019-03-17: qty 10

## 2019-03-17 MED ORDER — PALONOSETRON HCL INJECTION 0.25 MG/5ML
0.2500 mg | Freq: Once | INTRAVENOUS | Status: AC
  Administered 2019-03-17: 0.25 mg via INTRAVENOUS

## 2019-03-17 MED ORDER — SODIUM CHLORIDE 0.9 % IV SOLN
600.0000 mg/m2 | Freq: Once | INTRAVENOUS | Status: AC
  Administered 2019-03-17: 1020 mg via INTRAVENOUS
  Filled 2019-03-17: qty 51

## 2019-03-17 MED ORDER — SODIUM CHLORIDE 0.9 % IV SOLN
Freq: Once | INTRAVENOUS | Status: AC
  Administered 2019-03-17: 10:00:00 via INTRAVENOUS
  Filled 2019-03-17: qty 250

## 2019-03-17 NOTE — Patient Instructions (Signed)

## 2019-03-17 NOTE — Patient Instructions (Signed)
[facility] Discharge Instructions for Patients Receiving Chemotherapy  Today you received the following chemotherapy agents Docetaxel (TAXOTERE) & Cyclophosphamide (CYTOXAN).  To help prevent nausea and vomiting after your treatment, we encourage you to take your nausea medication as prescribed.   If you develop nausea and vomiting that is not controlled by your nausea medication, call the clinic.   BELOW ARE SYMPTOMS THAT SHOULD BE REPORTED IMMEDIATELY:  *FEVER GREATER THAN 100.5 F  *CHILLS WITH OR WITHOUT FEVER  NAUSEA AND VOMITING THAT IS NOT CONTROLLED WITH YOUR NAUSEA MEDICATION  *UNUSUAL SHORTNESS OF BREATH  *UNUSUAL BRUISING OR BLEEDING  TENDERNESS IN MOUTH AND THROAT WITH OR WITHOUT PRESENCE OF ULCERS  *URINARY PROBLEMS  *BOWEL PROBLEMS  UNUSUAL RASH Items with * indicate a potential emergency and should be followed up as soon as possible.  Feel free to call the clinic should you have any questions or concerns. The clinic phone number is [phone].  Please show the [facility] at check-in to the Emergency Department and triage nurse.  Pegfilgrastim injection What is this medicine? PEGFILGRASTIM (PEG fil gra stim) is a long-acting granulocyte colony-stimulating factor that stimulates the growth of neutrophils, a type of white blood cell important in the body's fight against infection. It is used to reduce the incidence of fever and infection in patients with certain types of cancer who are receiving chemotherapy that affects the bone marrow, and to increase survival after being exposed to high doses of radiation. This medicine may be used for other purposes; ask your health care provider or pharmacist if you have questions. COMMON BRAND NAME(S): Steve Rattler, Ziextenzo What should I tell my health care provider before I take this medicine? They need to know if you have any of these conditions:  kidney disease  latex  allergy  ongoing radiation therapy  sickle cell disease  skin reactions to acrylic adhesives (On-Body Injector only)  an unusual or allergic reaction to pegfilgrastim, filgrastim, other medicines, foods, dyes, or preservatives  pregnant or trying to get pregnant  breast-feeding How should I use this medicine? This medicine is for injection under the skin. If you get this medicine at home, you will be taught how to prepare and give the pre-filled syringe or how to use the On-body Injector. Refer to the patient Instructions for Use for detailed instructions. Use exactly as directed. Tell your healthcare provider immediately if you suspect that the On-body Injector may not have performed as intended or if you suspect the use of the On-body Injector resulted in a missed or partial dose. It is important that you put your used needles and syringes in a special sharps container. Do not put them in a trash can. If you do not have a sharps container, call your pharmacist or healthcare provider to get one. Talk to your pediatrician regarding the use of this medicine in children. While this drug may be prescribed for selected conditions, precautions do apply. Overdosage: If you think you have taken too much of this medicine contact a poison control center or emergency room at once. NOTE: This medicine is only for you. Do not share this medicine with others. What if I miss a dose? It is important not to miss your dose. Call your doctor or health care professional if you miss your dose. If you miss a dose due to an On-body Injector failure or leakage, a new dose should be administered as soon as possible using a single prefilled syringe for manual  use. What may interact with this medicine? Interactions have not been studied. Give your health care provider a list of all the medicines, herbs, non-prescription drugs, or dietary supplements you use. Also tell them if you smoke, drink alcohol, or use illegal  drugs. Some items may interact with your medicine. This list may not describe all possible interactions. Give your health care provider a list of all the medicines, herbs, non-prescription drugs, or dietary supplements you use. Also tell them if you smoke, drink alcohol, or use illegal drugs. Some items may interact with your medicine. What should I watch for while using this medicine? You may need blood work done while you are taking this medicine. If you are going to need a MRI, CT scan, or other procedure, tell your doctor that you are using this medicine (On-Body Injector only). What side effects may I notice from receiving this medicine? Side effects that you should report to your doctor or health care professional as soon as possible:  allergic reactions like skin rash, itching or hives, swelling of the face, lips, or tongue  back pain  dizziness  fever  pain, redness, or irritation at site where injected  pinpoint red spots on the skin  red or dark-brown urine  shortness of breath or breathing problems  stomach or side pain, or pain at the shoulder  swelling  tiredness  trouble passing urine or change in the amount of urine Side effects that usually do not require medical attention (report to your doctor or health care professional if they continue or are bothersome):  bone pain  muscle pain This list may not describe all possible side effects. Call your doctor for medical advice about side effects. You may report side effects to FDA at 1-800-FDA-1088. Where should I keep my medicine? Keep out of the reach of children. If you are using this medicine at home, you will be instructed on how to store it. Throw away any unused medicine after the expiration date on the label. NOTE: This sheet is a summary. It may not cover all possible information. If you have questions about this medicine, talk to your doctor, pharmacist, or health care provider.  2020 Elsevier/Gold  Standard (2017-07-12 16:57:08)  Coronavirus (COVID-19) Are you at risk?  Are you at risk for the Coronavirus (COVID-19)?  To be considered HIGH RISK for Coronavirus (COVID-19), you have to meet the following criteria:  . Traveled to [place], [place], [place], [place] or [place]; or in the [place] to [place], [place], [place], or [place]; and have fever, cough, and shortness of breath within the last 2 weeks of travel OR . Been in close contact with a person diagnosed with COVID-19 within the last 2 weeks and have fever, cough, and shortness of breath . IF YOU DO NOT MEET THESE CRITERIA, YOU ARE CONSIDERED LOW RISK FOR COVID-19.  What to do if you are HIGH RISK for COVID-19?  Marland Kitchen If you are having a medical emergency, call 911. . Seek medical care right away. Before you go to a doctor's office, urgent care or emergency department, call ahead and tell them about your recent travel, contact with someone diagnosed with COVID-19, and your symptoms. You should receive instructions from your physician's office regarding next steps of care.  . When you arrive at healthcare provider, tell the healthcare staff immediately you have returned from visiting [place], [place], [place], [place] or [place]; or traveled in the [place] to [place], [place], [place],  or [place]; in the last two weeks or you have been in close contact with a person diagnosed with COVID-19 in the last 2 weeks.   . Tell the health care staff about your symptoms: fever, cough and shortness of breath. . After you have been seen by a medical provider, you will be either: o Tested for (COVID-19) and discharged home on quarantine except to seek medical care if symptoms worsen, and asked to  - Stay home and avoid contact with others until you get your results (4-5 days)  - Avoid travel on public transportation if possible (such as bus, train, or airplane) or o Sent to the Emergency Department by EMS for evaluation,  COVID-19 testing, and possible admission depending on your condition and test results.  What to do if you are LOW RISK for COVID-19?  Reduce your risk of any infection by using the same precautions used for avoiding the common cold or flu:  Marland Kitchen Wash your hands often with soap and warm water for at least 20 seconds.  If soap and water are not readily available, use an alcohol-based hand sanitizer with at least 60% alcohol.  . If coughing or sneezing, cover your mouth and nose by coughing or sneezing into the elbow areas of your shirt or coat, into a tissue or into your sleeve (not your hands). . Avoid shaking hands with others and consider head nods or verbal greetings only. . Avoid touching your eyes, nose, or mouth with unwashed hands.  . Avoid close contact with people who are sick. . Avoid places or events with large numbers of people in one location, like concerts or sporting events. . Carefully consider travel plans you have or are making. . If you are planning any travel outside or inside the [place], visit the CDC's Travelers' Health webpage for the latest health notices. . If you have some symptoms but not all symptoms, continue to monitor at home and seek medical attention if your symptoms worsen. . If you are having a medical emergency, call 911.   [place] / e-Visit: [email or web address]         [facility]: [facility]: [phone]                   [facility]: [phone]

## 2019-03-17 NOTE — Assessment & Plan Note (Signed)
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 Genetic counseling: Normal did not reveal any abnormal mutations 2. Neoadjuvant chemotherapy. [facility] Perjeta 6 cycles followed by Herceptin And Perjetamaintenance completed 10/20/2016) from 12/31/15 to 04/15/16 3. Right mastectomy with reconstruction: 05/19/2016:Right mastectomy: IDC grade 3, 1.3 cm, low-grade DCIS, LVIDS present, margins negative, 0/1 lymph node negative, ypT1CypN0 stage IA; repeat HER-2 negative ratio 1.41 4. Adjuvant antiestrogen therapy With letrozole 2.5 mg daily started March 2018 -------------------------------------------------------------------------------------------------------------------------------------------------- Breast cancer: Subcutaneous disease: 1.8 cm measured by ultrasound Biopsy revealed grade 2-3 IDC ER 90% PR 0% HER-2 negative Ki-67 40% 11/30/2018 right lumpectomy [name]): IDC, grade 3, 2.1cm, perineural invasion present, carcinoma present focally at the posterior margin and involving skeletal muscle. Oncotype score 52: Greater than 39% risk of distant recurrence with hormone therapy alone, chemo benefit greater than 15%  Recommendation: 1.Surgery to remove the tumor8/03/2019 2.Adjuvant chemotherapy withTaxotere and Cytoxan x6 cyclesstarted 12/30/2018 3.Adjuvant radiation 4.Follow-up adjuvant antiestrogen therapy ------------------------------------------------------------------------------------------------------------------------------------ Current treatment: Cycle5-day 1Taxotere and Cytoxan Labs  reviewed  Chemo toxicities: 1.Mild body aches 2.Fatigue: Worsening with each treatment 3. Headaches: Probably due to Aloxi 4.Chemotherapy-induced anemia: Hemoglobin is9.9 We will continue to watch and monitor this. 5.Jugular vein DVT related to the port: Currently on blood thinners swelling has markedly improved.  Return to clinic in 3 weeks for cycle 6

## 2019-03-17 NOTE — Progress Notes (Signed)
Chemo doses reviewed w/ today's most recent wt at MD request. Pt has lost ~ 15 lbs since C1D1.  Kennith Center, Pharm.D., CPP 03/17/2019@10 :19 AM

## 2019-03-20 ENCOUNTER — Telehealth: Payer: Self-pay | Admitting: Hematology and Oncology

## 2019-03-20 ENCOUNTER — Other Ambulatory Visit: Payer: Self-pay | Admitting: *Deleted

## 2019-03-20 DIAGNOSIS — Z170 Estrogen receptor positive status [ER+]: Secondary | ICD-10-CM

## 2019-03-20 DIAGNOSIS — C50211 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 female breast: Secondary | ICD-10-CM

## 2019-03-20 MED ORDER — SODIUM CHLORIDE 0.9 % IV SOLN
Freq: Once | INTRAVENOUS | Status: DC
  Filled 2019-03-20: qty 250

## 2019-03-20 NOTE — Telephone Encounter (Signed)
Added additional appointments for 12/2 and 12/23. Confirmed with patient.

## 2019-03-22 ENCOUNTER — Inpatient Hospital Stay: Payer: Medicare Other | Attending: Hematology and Oncology

## 2019-03-22 ENCOUNTER — Other Ambulatory Visit: Payer: Self-pay

## 2019-03-22 VITALS — BP 98/63 | HR 100 | Temp 98.3°F | Resp 18

## 2019-03-22 DIAGNOSIS — Z5189 Encounter for other specified aftercare: Secondary | ICD-10-CM | POA: Diagnosis not present

## 2019-03-22 DIAGNOSIS — Z5111 Encounter for antineoplastic chemotherapy: Secondary | ICD-10-CM | POA: Insufficient documentation

## 2019-03-22 DIAGNOSIS — Z170 Estrogen receptor positive status [ER+]: Secondary | ICD-10-CM | POA: Diagnosis not present

## 2019-03-22 DIAGNOSIS — E860 Dehydration: Secondary | ICD-10-CM | POA: Diagnosis not present

## 2019-03-22 DIAGNOSIS — C50211 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 female breast: Secondary | ICD-10-CM | POA: Diagnosis not present

## 2019-03-22 MED ORDER — SODIUM CHLORIDE 0.9 % IV SOLN
INTRAVENOUS | Status: DC
  Administered 2019-03-22: 15:00:00 via INTRAVENOUS
  Filled 2019-03-22 (×2): qty 250

## 2019-03-22 MED ORDER — SODIUM CHLORIDE 0.9% FLUSH
10.0000 mL | INTRAVENOUS | Status: DC | PRN
  Administered 2019-03-22: 10 mL via INTRAVENOUS
  Filled 2019-03-22: qty 10

## 2019-03-22 MED ORDER — HEPARIN SOD (PORK) LOCK FLUSH 100 UNIT/ML IV SOLN
500.0000 [IU] | Freq: Once | INTRAVENOUS | Status: AC
  Administered 2019-03-22: 500 [IU] via INTRAVENOUS
  Filled 2019-03-22: qty 5

## 2019-03-22 NOTE — Patient Instructions (Signed)
Dehydration, Adult  Dehydration is a condition in which there is not enough fluid or water in the body. This happens when you lose more fluids than you take in. Important organs, such as the kidneys, brain, and heart, cannot function without a proper amount of fluids. Any loss of fluids from the body can lead to dehydration. Dehydration can range from mild to severe. This condition should be treated right away to prevent it from becoming severe. What are the causes? This condition may be caused by:  Vomiting.  Diarrhea.  Excessive sweating, such as from heat exposure or exercise.  Not drinking enough fluid, especially: ? When ill. ? While doing activity that requires a lot of energy.  Excessive urination.  Fever.  Infection.  Certain medicines, such as medicines that cause the body to lose excess fluid (diuretics).  Inability to access safe drinking water.  Reduced physical ability to get adequate water and food. What increases the risk? This condition is more likely to develop in people:  Who have a poorly controlled long-term (chronic) illness, such as diabetes, heart disease, or kidney disease.  Who are age 65 or older.  Who are disabled.  Who live in a place with high altitude.  Who play endurance sports. What are the signs or symptoms? Symptoms of mild dehydration may include:  Thirst.  Dry lips.  Slightly dry mouth.  Dry, warm skin.  Dizziness. Symptoms of moderate dehydration may include:  Very dry mouth.  Muscle cramps.  Dark urine. Urine may be the color of tea.  Decreased urine production.  Decreased tear production.  Heartbeat that is irregular or faster than normal (palpitations).  Headache.  Light-headedness, especially when you stand up from a sitting position.  Fainting (syncope). Symptoms of severe dehydration may include:  Changes in skin, such as: ? Cold and clammy skin. ? Blotchy (mottled) or pale skin. ? Skin that does  not quickly return to normal after being lightly pinched and released (poor skin turgor).  Changes in body fluids, such as: ? Extreme thirst. ? No tear production. ? Inability to sweat when body temperature is high, such as in hot weather. ? Very little urine production.  Changes in vital signs, such as: ? Weak pulse. ? Pulse that is more than 100 beats a minute when sitting still. ? Rapid breathing. ? Low blood pressure.  Other changes, such as: ? Sunken eyes. ? Cold hands and feet. ? Confusion. ? Lack of energy (lethargy). ? Difficulty waking up from sleep. ? Short-term weight loss. ? Unconsciousness. How is this diagnosed? This condition is diagnosed based on your symptoms and a physical exam. Blood and urine tests may be done to help confirm the diagnosis. How is this treated? Treatment for this condition depends on the severity. Mild or moderate dehydration can often be treated at home. Treatment should be started right away. Do not wait until dehydration becomes severe. Severe dehydration is an emergency and it needs to be treated in a hospital. Treatment for mild dehydration may include:  Drinking more fluids.  Replacing salts and minerals in your blood (electrolytes) that you may have lost. Treatment for moderate dehydration may include:  Drinking an oral rehydration solution (ORS). This is a drink that helps you replace fluids and electrolytes (rehydrate). It can be found at pharmacies and retail stores. Treatment for severe dehydration may include:  Receiving fluids through an IV tube.  Receiving an electrolyte solution through a feeding tube that is passed through your nose and   into your stomach (nasogastric tube, or NG tube).  Correcting any abnormalities in electrolytes.  Treating the underlying cause of dehydration. Follow these instructions at home:  If directed by your health care provider, drink an ORS: ? Make an ORS by following instructions on the  package. ? Start by drinking small amounts, about  cup (120 mL) every 5-10 minutes. ? Slowly increase how much you drink until you have taken the amount recommended by your health care provider.  Drink enough clear fluid to keep your urine clear or pale yellow. If you were told to drink an ORS, finish the ORS first, then start slowly drinking other clear fluids. Drink fluids such as: ? Water. Do not drink only water. Doing that can lead to having too little salt (sodium) in the body (hyponatremia). ? Ice chips. ? Fruit juice that you have added water to (diluted fruit juice). ? Low-calorie sports drinks.  Avoid: ? Alcohol. ? Drinks that contain a lot of sugar. These include high-calorie sports drinks, fruit juice that is not diluted, and soda. ? Caffeine. ? Foods that are greasy or contain a lot of fat or sugar.  Take over-the-counter and prescription medicines only as told by your health care provider.  Do not take sodium tablets. This can lead to having too much sodium in the body (hypernatremia).  Eat foods that contain a healthy balance of electrolytes, such as bananas, oranges, potatoes, tomatoes, and spinach.  Keep all follow-up visits as told by your health care provider. This is important. Contact a health care provider if:  You have abdominal pain that: ? Gets worse. ? Stays in one area (localizes).  You have a rash.  You have a stiff neck.  You are more irritable than usual.  You are sleepier or more difficult to wake up than usual.  You feel weak or dizzy.  You feel very thirsty.  You have urinated only a small amount of very dark urine over 6-8 hours. Get help right away if:  You have symptoms of severe dehydration.  You cannot drink fluids without vomiting.  Your symptoms get worse with treatment.  You have a fever.  You have a severe headache.  You have vomiting or diarrhea that: ? Gets worse. ? Does not go away.  You have blood or green matter  (bile) in your vomit.  You have blood in your stool. This may cause stool to look black and tarry.  You have not urinated in 6-8 hours.  You faint.  Your heart rate while sitting still is over 100 beats a minute.  You have trouble breathing. This information is not intended to replace advice given to you by your health care provider. Make sure you discuss any questions you have with your health care provider. Document Released: 04/06/2005 Document Revised: 03/19/2017 Document Reviewed: 05/31/2015 Elsevier Patient Education  2020 [employer]  

## 2019-03-23 ENCOUNTER — Telehealth: Payer: Self-pay | Admitting: Nurse Practitioner

## 2019-03-23 NOTE — Telephone Encounter (Signed)
Pt stated that she is going through chemo and is dehydrated.  She has been experiencing painful, hard stools with rectal bleeding.  Please advise.

## 2019-03-24 NOTE — Telephone Encounter (Signed)
Thanks [employer]. She should try to push fluids as best she can. Use Miralax twice daily as tolerated, continue topical lidocaine. If no better she should call back thanks

## 2019-03-24 NOTE — Telephone Encounter (Signed)
Spoke with the patient. She reports dehydration and poor PO intake overall while on chemotherapy. She was recently given IV fluids due to the dehydration. Calling GI due to the constipation , dry hard stools and low abdominal cramping. Passing stool last 2 days has been like "broken glass." Had blood with her stool yesterday. She is using Colace with sennosides in it twice daily. Putting Preparation H with lidocaine on the rectal area. Comfortable at this time. She is not using Miralax or taking fiber. Please advise.

## 2019-03-24 NOTE — Telephone Encounter (Signed)
Spoke with the patient. Discussed the plan. She is comfortable with this and agrees to follow the recommendations. She is encouraged to call us back if he symptoms acutely worsen or fail to improve.

## 2019-03-27 ENCOUNTER — Other Ambulatory Visit: Payer: Self-pay | Admitting: *Deleted

## 2019-03-27 DIAGNOSIS — C50211 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 female breast: Secondary | ICD-10-CM

## 2019-03-27 DIAGNOSIS — Z170 Estrogen receptor positive status [ER+]: Secondary | ICD-10-CM

## 2019-03-29 ENCOUNTER — Other Ambulatory Visit: Payer: Self-pay | Admitting: General Surgery

## 2019-04-05 ENCOUNTER — Ambulatory Visit (INDEPENDENT_AMBULATORY_CARE_PROVIDER_SITE_OTHER): Payer: Medicare Other | Admitting: Psychology

## 2019-04-05 DIAGNOSIS — F064 Anxiety disorder due to known physiological condition: Secondary | ICD-10-CM | POA: Diagnosis not present

## 2019-04-06 NOTE — Progress Notes (Signed)
Patient Care Team: [name], MD as PCP - General (Family Medicine)  DIAGNOSIS:    ICD-10-CM   1. 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 breast in female, estrogen receptor positive ([place])  C50.211    Z17.0     SUMMARY OF ONCOLOGIC HISTORY: Oncology History  Breast cancer of upper-inner quadrant of right female breast ([place])  12/05/2015 Mammogram   Right breast mass 2:00 4 cm from nipple: 1.7 cm, T1c N0 stage IA; right breast 2:00 6 cm from nipple 1.3 cm mass, 7 mm satellite nodule between the 2 masses, T1 cN0 stage IA   12/06/2015 Initial Diagnosis   Right breast biopsy 2:00 6 cm from nipple: Grade 2-3 invasive ductal cancer, ER 90%, PR 2%, Ki-67 80%, HER-2 positive ratio 2.07; right biopsy 2:00 4 cm from nipple: IDC with DCIS, ER 95%, PR 10%, Ki-67 90%, HER-2 negative ratio 1.27   12/18/2015 Breast MRI   3 enhancing masses in the upper inner quadrant right breast spanning an area 3.7 cm (1.7 cm, 1 cm, 1.4 cm) no lymph node enlargement   12/31/2015 - 04/15/2016 Neo-Adjuvant Chemotherapy   [facility] Perjeta 6 cycles followed by Herceptin, Perjeta maintenance for 1 year completed 10/20/2016   01/08/2016 - 01/12/2016 Hospital Admission   Neutropenic fever hospitalization (because patient did not receive Neulasta with cycle 1)   01/10/2016 Miscellaneous   Genetic testing was normal did not reveal any mutations   04/16/2016 Breast MRI   Near CR to therapy. Previous 2 biopsied massses are no longer seen. Third satellite lesion is smaller from 10 mm to 5.3 mm, no abnormal LN.   05/19/2016 Surgery   Right mastectomy: IDC grade 3, 1.3 cm, low-grade DCIS, LVIDS present, margins negative, 0/1 lymph node negative, ypT1CypN0 stage IA; repeat HER-2 negative ratio 1.41   05/19/2016 Surgery   Right breast reconstruction with tissue expander. Acellular dermis for breast reconstruction (Dr.Thimappa)    06/22/2016 -  Anti-estrogen oral therapy   Letrozole 2.5 mg daily   11/01/2018  Relapse/Recurrence   Patient palpated lump in the UIQ at surgical scar of reconstructed right breast. US of the right breast showed a 1.8cm mass suspicious for recurrent breast cancer. Biopsy showed grade 2 invasive ductal carcinoma, HER-2 negative (1+), ER 90%, PR negative, Ki67 40%.   11/11/2018 Oncotype testing   Oncotype score 52: Greater than 39% risk of distant recurrence with hormone therapy alone, chemo benefit greater than 15%   11/30/2018 Surgery   Right lumpectomy [name]): IDC, grade 3, 2.1cm, perineural invasion present, carcinoma present focally at the posterior margin and involving skeletal muscle.   11/30/2018 Cancer Staging   Staging form: Breast, AJCC 8th Edition - Pathologic stage from 11/30/2018: Stage IIA (pT2, pN0, cM0, G3, ER+, PR-, HER2-) - Signed by Gardenia Phlegm, NP on 12/14/2018   12/23/2018 -  Chemotherapy   The patient had palonosetron (ALOXI) injection 0.25 mg, 0.25 mg, Intravenous,  Once, 8 of 9 cycles Administration: 0.25 mg (12/23/2018), 0.25 mg (01/13/2019), 0.25 mg (02/03/2019), 0.25 mg (02/24/2019), 0.25 mg (03/17/2019) pegfilgrastim (NEULASTA ONPRO KIT) injection 6 mg, 6 mg, Subcutaneous, Once, 8 of 9 cycles Administration: 6 mg (12/23/2018), 6 mg (01/13/2019), 6 mg (02/03/2019), 6 mg (02/24/2019) cyclophosphamide (CYTOXAN) 1,060 mg in sodium chloride 0.9 % 250 mL chemo infusion, 600 mg/m2 = 1,060 mg, Intravenous,  Once, 8 of 9 cycles Administration: 1,060 mg (12/23/2018), 1,060 mg (01/13/2019), 1,060 mg (02/03/2019), 1,060 mg (02/24/2019), 1,020 mg (03/17/2019) DOCEtaxel (TAXOTERE) 130 mg in sodium chloride 0.9 %  250 mL chemo infusion, 75 mg/m2 = 130 mg, Intravenous,  Once, 8 of 9 cycles Administration: 130 mg (12/23/2018), 130 mg (01/13/2019), 130 mg (02/03/2019), 130 mg (02/24/2019), 130 mg (03/17/2019) fosaprepitant (EMEND) 150 mg in sodium chloride 0.9 % 145 mL IVPB, , Intravenous,  Once, 8 of 9 cycles Administration:  (12/23/2018),  (02/03/2019),  (01/13/2019),   (02/24/2019),  (03/17/2019)  for chemotherapy treatment.      CHIEF COMPLIANT: Cycle6 Taxotere and Cytoxan  INTERVAL HISTORY: Patricia Chandler is a 63 y.o. with above-mentioned history of recurrent right breast cancerwho underwent a lumpectomy and is currently on adjuvant chemotherapy with Taxotere and Cytoxan.She presents to the clinic todayforcycle6.   REVIEW OF SYSTEMS:   Constitutional: Denies fevers, chills or abnormal weight loss Eyes: Denies blurriness of vision Ears, nose, mouth, throat, and face: Denies mucositis or sore throat Respiratory: Denies cough, dyspnea or wheezes Cardiovascular: Denies palpitation, chest discomfort Gastrointestinal: Denies nausea, heartburn or change in bowel habits Skin: Denies abnormal skin rashes Lymphatics: Denies new lymphadenopathy or easy bruising Neurological: Denies numbness, tingling or new weaknesses Behavioral/Psych: Mood is stable, no new changes  Extremities: No lower extremity edema Breast: denies any pain or lumps or nodules in either breasts All other systems were reviewed with the patient and are negative.  I have reviewed the past medical history, past surgical history, social history and family history with the patient and they are unchanged from previous note.  ALLERGIES:  has No Known Allergies.  MEDICATIONS:  Current Outpatient Medications  Medication Sig Dispense Refill  . Biotin 1 MG CAPS Take by mouth.    . calcium-vitamin D (OSCAL WITH D) 500-200 MG-UNIT tablet Take 1 tablet by mouth daily with breakfast.    . cyclophosphamide in sodium chloride 0.9 % 250 mL Inject into the vein as directed. Once every three weeks  Not sure of dose    . denosumab (PROLIA) 60 MG/ML SOLN injection Inject 60 mg into the skin every 6 (six) months. Administer in upper arm, thigh, or abdomen    . dexamethasone (DECADRON) 4 MG tablet Take 1 tablet (4 mg total) by mouth 2 (two) times daily. Take 1 tablet day before chemo and 1  tablet day after chemo with food 8 tablet 0  . dicyclomine (BENTYL) 10 MG capsule Take 1 capsule (10 mg total) by mouth 4 (four) times daily -  before meals and at bedtime. (Patient taking differently: Take 10 mg by mouth as needed. ) 60 capsule 1  . DOCEtaxel (TAXOTERE IV) Inject into the vein as directed. Once every three weeks    . Lactobacillus (PROBIOTIC ACIDOPHILUS PO) Take 1 capsule by mouth daily.     Marland Kitchen lidocaine-prilocaine (EMLA) cream Apply to affected area once 30 g 3  . LORazepam (ATIVAN) 0.5 MG tablet Take 1 tablet (0.5 mg total) by mouth at bedtime as needed for sleep. 30 tablet 3  . omeprazole (PRILOSEC) 20 MG capsule Take 20 mg by mouth daily as needed (heartburn).    . ondansetron (ZOFRAN) 8 MG tablet Take 1 tablet (8 mg total) by mouth 2 (two) times daily as needed for refractory nausea / vomiting. Start on day 3 after chemo. 30 tablet 1  . oxyCODONE-acetaminophen (PERCOCET/ROXICET) 5-325 MG tablet Take 1 tablet by mouth every 6 (six) hours as needed for severe pain. 30 tablet 0  . prochlorperazine (COMPAZINE) 10 MG tablet Take 1 tablet (10 mg total) by mouth every 6 (six) hours as needed (Nausea or vomiting). 30 tablet 1  .  Rivaroxaban 15 & 20 MG TBPK Follow package directions: Take one 82m tablet by mouth twice a day. On day 22, switch to one 265mtablet once a day. Take with food. 51 each 0  . zolpidem (AMBIEN) 5 MG tablet Take 1 tablet (5 mg total) by mouth at bedtime. 30 tablet 0   No current facility-administered medications for this visit.    PHYSICAL EXAMINATION: ECOG PERFORMANCE STATUS: 1 - Symptomatic but completely ambulatory  Vitals:   04/07/19 1107  BP: 119/75  Pulse: 84  Resp: 17  Temp: 98.2 F (36.8 C)  SpO2: 100%   Filed Weights   04/07/19 1107  Weight: 138 lb 12.8 oz (63 kg)    GENERAL: alert, no distress and comfortable SKIN: skin color, texture, turgor are normal, no rashes or significant lesions EYES: normal, Conjunctiva are pink and  non-injected, sclera clear OROPHARYNX: no exudate, no erythema and lips, buccal mucosa, and tongue normal  NECK: supple, thyroid normal size, non-tender, without nodularity LYMPH: no palpable lymphadenopathy in the cervical, axillary or inguinal LUNGS: clear to auscultation and percussion with normal breathing effort HEART: regular rate & rhythm and no murmurs and no lower extremity edema ABDOMEN: abdomen soft, non-tender and normal bowel sounds MUSCULOSKELETAL: no cyanosis of digits and no clubbing  NEURO: alert & oriented x 3 with fluent speech, no focal motor/sensory deficits EXTREMITIES: No lower extremity edema  LABORATORY DATA:  I have reviewed the data as listed CMP Latest Ref Rng & Units 03/17/2019 02/24/2019 02/03/2019  Glucose 70 - 99 mg/dL 96 83 87  BUN 8 - 23 mg/dL '12 18 10  ' Creatinine 0.44 - 1.00 mg/dL 0.75 0.72 0.74  Sodium 135 - 145 mmol/L 141 138 140  Potassium 3.5 - 5.1 mmol/L 4.0 3.8 3.7  Chloride 98 - 111 mmol/L 107 106 106  CO2 22 - 32 mmol/L '24 22 23  ' Calcium 8.9 - 10.3 mg/dL 9.0 9.3 9.3  Total Protein 6.5 - 8.1 g/dL 5.8(L) 6.1(L) 6.4(L)  Total Bilirubin 0.3 - 1.2 mg/dL 0.3 0.3 0.3  Alkaline Phos 38 - 126 U/L 63 60 72  AST 15 - 41 U/L '19 18 25  ' ALT 0 - 44 U/L '17 18 25    ' Lab Results  Component Value Date   WBC 5.4 04/07/2019   HGB 9.5 (L) 04/07/2019   HCT 29.0 (L) 04/07/2019   MCV 95.7 04/07/2019   PLT 289 04/07/2019   NEUTROABS 4.0 04/07/2019    ASSESSMENT & PLAN:  Breast cancer of upper-inner quadrant of right female breast ([facility]) Right breast biopsy 2:00 6 cm from nipple: Grade 2-3 invasive ductal cancer, ER 90%, PR 2%, Ki-67 80%, HER-2 positive ratio 2.07; right biopsy 2:00 4 cm from nipple: IDC with DCIS, ER 95%, PR 10%, Ki-67 90%, HER-2 negative ratio 1.27 Breast MRI08/30/2017: 3 enhancing masses in the upper inner quadrant right breast spanning an area 3.7 cm (1.7 cm, 1 cm, 1.4 cm) no lymph node enlargement Clinical stage: T2N0 (stage  2A)  Treatment plan: 1. Genetic counseling: Normal did not reveal any abnormal mutations 2. [facility]. [facility] followed by Herceptin And Perjetamaintenance completed 10/20/2016) from 12/31/15 to 04/15/16 3. Right mastectomy with reconstruction: 05/19/2016:Right mastectomy: IDC grade 3, 1.3 cm, low-grade DCIS, LVIDS present, margins negative, 0/1 lymph node negative, ypT1CypN0 stage IA; repeat HER-2 negative ratio 1.41 4. Adjuvant antiestrogen therapy With letrozole 2.5 mg daily started March 2018 -------------------------------------------------------------------------------------------------------------------------------------------------- Breast cancer: Subcutaneous disease: 1.8 cm measured by ultrasound Biopsy revealed grade 2-3 IDC  ER 90% PR 0% HER-2 negative Ki-67 40% 11/30/2018 right lumpectomy [name]): IDC, grade 3, 2.1cm, perineural invasion present, carcinoma present focally at the posterior margin and involving skeletal muscle. Oncotype score 52: Greater than 39% risk of distant recurrence with hormone therapy alone, chemo benefit greater than 15%  Recommendation: 1.Surgery to remove the tumor8/03/2019 2.Adjuvant chemotherapy withTaxotere and Cytoxan x6 cyclesstarted 12/30/2018 3.Adjuvant radiation 4.Follow-up adjuvant antiestrogen therapy ------------------------------------------------------------------------------------------------------------------------------------ Current treatment: Cycle6-day 1Taxotere and Cytoxan Labs reviewed  Chemo toxicities: 1.Mild body aches 2.Fatigue:Worsening with each treatment 3. Headaches: Probably due to Aloxi 4.Chemotherapy-induced anemia: Hemoglobin is9.9 We will continue to watch and monitor this. 5.Jugular vein DVT related to the port: Currently on blood thinners swelling has markedly improved. 6.  Shortness of breath to exertion: Due to deconditioning from weight loss and  fatigue  We will adjust the dosage to her current weight today. Adjuvant radiation therapy consultation will be placed. Return to clinic after radiation to start antiestrogen therapy.     No orders of the defined types were placed in this encounter.  The patient has a good understanding of the overall plan. she agrees with it. she will call with any problems that may develop before the next visit here.  Nicholas Lose, MD 04/07/2019  Julious Oka Dorshimer, am acting as scribe for Dr. Nicholas Lose.  I have reviewed the above document for accuracy and completeness, and I agree with the above.

## 2019-04-07 ENCOUNTER — Other Ambulatory Visit: Payer: Self-pay

## 2019-04-07 ENCOUNTER — Inpatient Hospital Stay: Payer: Medicare Other

## 2019-04-07 ENCOUNTER — Encounter: Payer: Self-pay | Admitting: *Deleted

## 2019-04-07 ENCOUNTER — Inpatient Hospital Stay (HOSPITAL_BASED_OUTPATIENT_CLINIC_OR_DEPARTMENT_OTHER): Payer: Medicare Other | Admitting: Hematology and Oncology

## 2019-04-07 DIAGNOSIS — C50211 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 female breast: Secondary | ICD-10-CM | POA: Diagnosis not present

## 2019-04-07 DIAGNOSIS — Z170 Estrogen receptor positive status [ER+]: Secondary | ICD-10-CM

## 2019-04-07 DIAGNOSIS — Z95828 Presence of other vascular implants and grafts: Secondary | ICD-10-CM

## 2019-04-07 DIAGNOSIS — E860 Dehydration: Secondary | ICD-10-CM | POA: Diagnosis not present

## 2019-04-07 DIAGNOSIS — Z5111 Encounter for antineoplastic chemotherapy: Secondary | ICD-10-CM | POA: Diagnosis not present

## 2019-04-07 DIAGNOSIS — Z5189 Encounter for other specified aftercare: Secondary | ICD-10-CM | POA: Diagnosis not present

## 2019-04-07 LAB — CMP (CANCER CENTER ONLY)
ALT: 13 U/L (ref 0–44)
AST: 18 U/L (ref 15–41)
Albumin: 3.6 g/dL (ref 3.5–5.0)
Alkaline Phosphatase: 51 U/L (ref 38–126)
Anion gap: 8 (ref 5–15)
BUN: 11 mg/dL (ref 8–23)
CO2: 25 mmol/L (ref 22–32)
Calcium: 8.6 mg/dL — ABNORMAL LOW (ref 8.9–10.3)
Chloride: 107 mmol/L (ref 98–111)
Creatinine: 0.68 mg/dL (ref 0.44–1.00)
GFR, Est AFR Am: >60 mL/min (ref >60)
GFR, Estimated: >60 mL/min (ref >60)
Glucose, Bld: 85 mg/dL (ref 70–99)
Potassium: 3.8 mmol/L (ref 3.5–5.1)
Sodium: 140 mmol/L (ref 135–145)
Total Bilirubin: 0.4 mg/dL (ref 0.3–1.2)
Total Protein: 5.6 g/dL — ABNORMAL LOW (ref 6.5–8.1)

## 2019-04-07 LAB — CBC WITH DIFFERENTIAL (CANCER CENTER ONLY)
Abs Immature Granulocytes: 0.02 10*3/uL (ref 0.00–0.07)
Basophils Absolute: 0.1 10*3/uL (ref 0.0–0.1)
Basophils Relative: 2 %
Eosinophils Absolute: 0 10*3/uL (ref 0.0–0.5)
Eosinophils Relative: 1 %
HCT: 29 % — ABNORMAL LOW (ref 36.0–46.0)
Hemoglobin: 9.5 g/dL — ABNORMAL LOW (ref 12.0–15.0)
Immature Granulocytes: 0 %
Lymphocytes Relative: 11 %
Lymphs Abs: 0.6 10*3/uL — ABNORMAL LOW (ref 0.7–4.0)
MCH: 31.4 pg (ref 26.0–34.0)
MCHC: 32.8 g/dL (ref 30.0–36.0)
MCV: 95.7 fL (ref 80.0–100.0)
Monocytes Absolute: 0.7 10*3/uL (ref 0.1–1.0)
Monocytes Relative: 13 %
Neutro Abs: 4 10*3/uL (ref 1.7–7.7)
Neutrophils Relative %: 73 %
Platelet Count: 289 10*3/uL (ref 150–400)
RBC: 3.03 MIL/uL — ABNORMAL LOW (ref 3.87–5.11)
RDW: 16.6 % — ABNORMAL HIGH (ref 11.5–15.5)
WBC Count: 5.4 10*3/uL (ref 4.0–10.5)
nRBC: 0 % (ref 0.0–0.2)

## 2019-04-07 MED ORDER — PALONOSETRON HCL INJECTION 0.25 MG/5ML
0.2500 mg | Freq: Once | INTRAVENOUS | Status: AC
  Administered 2019-04-07: 0.25 mg via INTRAVENOUS

## 2019-04-07 MED ORDER — HEPARIN SOD (PORK) LOCK FLUSH 100 UNIT/ML IV SOLN
500.0000 [IU] | Freq: Once | INTRAVENOUS | Status: AC | PRN
  Administered 2019-04-07: 500 [IU]
  Filled 2019-04-07: qty 5

## 2019-04-07 MED ORDER — SODIUM CHLORIDE 0.9 % IV SOLN
75.0000 mg/m2 | Freq: Once | INTRAVENOUS | Status: AC
  Administered 2019-04-07: 130 mg via INTRAVENOUS
  Filled 2019-04-07: qty 13

## 2019-04-07 MED ORDER — PEGFILGRASTIM 6 MG/0.6ML ~~LOC~~ PSKT
PREFILLED_SYRINGE | SUBCUTANEOUS | Status: AC
  Filled 2019-04-07: qty 0.6

## 2019-04-07 MED ORDER — PALONOSETRON HCL INJECTION 0.25 MG/5ML
INTRAVENOUS | Status: AC
  Filled 2019-04-07: qty 5

## 2019-04-07 MED ORDER — SODIUM CHLORIDE 0.9 % IV SOLN
600.0000 mg/m2 | Freq: Once | INTRAVENOUS | Status: AC
  Administered 2019-04-07: 1020 mg via INTRAVENOUS
  Filled 2019-04-07: qty 51

## 2019-04-07 MED ORDER — PEGFILGRASTIM 6 MG/0.6ML ~~LOC~~ PSKT
6.0000 mg | PREFILLED_SYRINGE | Freq: Once | SUBCUTANEOUS | Status: AC
  Administered 2019-04-07: 6 mg via SUBCUTANEOUS

## 2019-04-07 MED ORDER — SODIUM CHLORIDE 0.9 % IV SOLN
Freq: Once | INTRAVENOUS | Status: AC
  Filled 2019-04-07: qty 250

## 2019-04-07 MED ORDER — SODIUM CHLORIDE 0.9% FLUSH
10.0000 mL | INTRAVENOUS | Status: DC | PRN
  Administered 2019-04-07: 10 mL via INTRAVENOUS
  Filled 2019-04-07: qty 10

## 2019-04-07 MED ORDER — SODIUM CHLORIDE 0.9% FLUSH
10.0000 mL | INTRAVENOUS | Status: DC | PRN
  Administered 2019-04-07: 10 mL
  Filled 2019-04-07: qty 10

## 2019-04-07 MED ORDER — SODIUM CHLORIDE 0.9 % IV SOLN
Freq: Once | INTRAVENOUS | Status: AC
  Filled 2019-04-07: qty 5

## 2019-04-07 NOTE — Patient Instructions (Signed)

## 2019-04-07 NOTE — Patient Instructions (Signed)
Cancer Center Discharge Instructions for Patients Receiving Chemotherapy  Today you received the following chemotherapy agents Taxotere and Cytoxan  To help prevent nausea and vomiting after your treatment, we encourage you to take your nausea medication as directed.    If you develop nausea and vomiting that is not controlled by your nausea medication, call the clinic.   BELOW ARE SYMPTOMS THAT SHOULD BE REPORTED IMMEDIATELY:  *FEVER GREATER THAN 100.5 F  *CHILLS WITH OR WITHOUT FEVER  NAUSEA AND VOMITING THAT IS NOT CONTROLLED WITH YOUR NAUSEA MEDICATION  *UNUSUAL SHORTNESS OF BREATH  *UNUSUAL BRUISING OR BLEEDING  TENDERNESS IN MOUTH AND THROAT WITH OR WITHOUT PRESENCE OF ULCERS  *URINARY PROBLEMS  *BOWEL PROBLEMS  UNUSUAL RASH Items with * indicate a potential emergency and should be followed up as soon as possible.  Feel free to call the clinic should you have any questions or concerns. The clinic phone number is [phone].  Please show the CHEMO ALERT CARD at check-in to the Emergency Department and triage nurse.   

## 2019-04-07 NOTE — Assessment & Plan Note (Signed)
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 Genetic counseling: Normal did not reveal any abnormal mutations 2. Neoadjuvant chemotherapy. [facility] Perjeta 6 cycles followed by Herceptin And Perjetamaintenance completed 10/20/2016) from 12/31/15 to 04/15/16 3. Right mastectomy with reconstruction: 05/19/2016:Right mastectomy: IDC grade 3, 1.3 cm, low-grade DCIS, LVIDS present, margins negative, 0/1 lymph node negative, ypT1CypN0 stage IA; repeat HER-2 negative ratio 1.41 4. Adjuvant antiestrogen therapy With letrozole 2.5 mg daily started March 2018 -------------------------------------------------------------------------------------------------------------------------------------------------- Breast cancer: Subcutaneous disease: 1.8 cm measured by ultrasound Biopsy revealed grade 2-3 IDC ER 90% PR 0% HER-2 negative Ki-67 40% 11/30/2018 right lumpectomy [name]): IDC, grade 3, 2.1cm, perineural invasion present, carcinoma present focally at the posterior margin and involving skeletal muscle. Oncotype score 52: Greater than 39% risk of distant recurrence with hormone therapy alone, chemo benefit greater than 15%  Recommendation: 1.Surgery to remove the tumor8/03/2019 2.Adjuvant chemotherapy withTaxotere and Cytoxan x6 cyclesstarted 12/30/2018 3.Adjuvant radiation 4.Follow-up adjuvant antiestrogen therapy ------------------------------------------------------------------------------------------------------------------------------------ Current treatment: Cycle6-day 1Taxotere and Cytoxan Labs  reviewed  Chemo toxicities: 1.Mild body aches 2.Fatigue:Worsening with each treatment 3. Headaches: Probably due to Aloxi 4.Chemotherapy-induced anemia: Hemoglobin is9.9 We will continue to watch and monitor this. 5.Jugular vein DVT related to the port: Currently on blood thinners swelling has markedly improved. 6.  Shortness of breath to exertion: Due to deconditioning from weight loss and fatigue  We will adjust the dosage to her current weight today. Adjuvant radiation therapy consultation will be placed. Return to clinic after radiation to start antiestrogen therapy.

## 2019-04-12 ENCOUNTER — Inpatient Hospital Stay: Payer: Medicare Other

## 2019-04-12 ENCOUNTER — Other Ambulatory Visit: Payer: Self-pay

## 2019-04-12 ENCOUNTER — Encounter (HOSPITAL_BASED_OUTPATIENT_CLINIC_OR_DEPARTMENT_OTHER): Payer: Self-pay | Admitting: General Surgery

## 2019-04-19 NOTE — Progress Notes (Signed)

## 2019-04-22 ENCOUNTER — Other Ambulatory Visit (HOSPITAL_COMMUNITY)
Admission: RE | Admit: 2019-04-22 | Discharge: 2019-04-22 | Disposition: A | Discharge status: Home / Self Care | Payer: Medicare Other | Source: Ambulatory Visit | Attending: General Surgery | Admitting: General Surgery

## 2019-04-22 ENCOUNTER — Other Ambulatory Visit: Payer: Self-pay | Admitting: General Surgery

## 2019-04-22 DIAGNOSIS — Z01812 Encounter for preprocedural laboratory examination: Secondary | ICD-10-CM | POA: Insufficient documentation

## 2019-04-22 DIAGNOSIS — Z20822 Contact with and (suspected) exposure to covid-19: Secondary | ICD-10-CM | POA: Diagnosis not present

## 2019-04-23 LAB — NOVEL CORONAVIRUS, NAA (HOSP ORDER, SEND-OUT TO REF LAB; TAT 18-24 HRS): SARS-CoV-2, NAA: NOT DETECTED

## 2019-04-26 ENCOUNTER — Encounter (HOSPITAL_BASED_OUTPATIENT_CLINIC_OR_DEPARTMENT_OTHER)
Admission: RE | Disposition: A | Discharge status: Home / Self Care | Payer: Self-pay | Source: Home / Self Care | Attending: General Surgery

## 2019-04-26 ENCOUNTER — Ambulatory Visit (HOSPITAL_BASED_OUTPATIENT_CLINIC_OR_DEPARTMENT_OTHER): Payer: Medicare Other | Admitting: Anesthesiology

## 2019-04-26 ENCOUNTER — Ambulatory Visit (HOSPITAL_BASED_OUTPATIENT_CLINIC_OR_DEPARTMENT_OTHER)
Admission: RE | Admit: 2019-04-26 | Discharge: 2019-04-26 | Disposition: A | Discharge status: Home / Self Care | Payer: Medicare Other | Attending: General Surgery | Admitting: General Surgery

## 2019-04-26 ENCOUNTER — Encounter (HOSPITAL_BASED_OUTPATIENT_CLINIC_OR_DEPARTMENT_OTHER): Payer: Self-pay | Admitting: General Surgery

## 2019-04-26 ENCOUNTER — Other Ambulatory Visit: Payer: Self-pay

## 2019-04-26 DIAGNOSIS — I429 Cardiomyopathy, unspecified: Secondary | ICD-10-CM | POA: Diagnosis not present

## 2019-04-26 DIAGNOSIS — Z7952 Long term (current) use of systemic steroids: Secondary | ICD-10-CM | POA: Insufficient documentation

## 2019-04-26 DIAGNOSIS — Z452 Encounter for adjustment and management of vascular access device: Secondary | ICD-10-CM | POA: Insufficient documentation

## 2019-04-26 DIAGNOSIS — Z853 Personal history of malignant neoplasm of breast: Secondary | ICD-10-CM | POA: Insufficient documentation

## 2019-04-26 DIAGNOSIS — K219 Gastro-esophageal reflux disease without esophagitis: Secondary | ICD-10-CM | POA: Diagnosis not present

## 2019-04-26 DIAGNOSIS — F419 Anxiety disorder, unspecified: Secondary | ICD-10-CM | POA: Insufficient documentation

## 2019-04-26 DIAGNOSIS — Z8249 Family history of ischemic heart disease and other diseases of the circulatory system: Secondary | ICD-10-CM | POA: Diagnosis not present

## 2019-04-26 DIAGNOSIS — Z79899 Other long term (current) drug therapy: Secondary | ICD-10-CM | POA: Insufficient documentation

## 2019-04-26 DIAGNOSIS — Z803 Family history of malignant neoplasm of breast: Secondary | ICD-10-CM | POA: Diagnosis not present

## 2019-04-26 DIAGNOSIS — Z7901 Long term (current) use of anticoagulants: Secondary | ICD-10-CM | POA: Insufficient documentation

## 2019-04-26 DIAGNOSIS — Z9221 Personal history of antineoplastic chemotherapy: Secondary | ICD-10-CM | POA: Insufficient documentation

## 2019-04-26 DIAGNOSIS — C50211 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 female breast: Secondary | ICD-10-CM | POA: Diagnosis not present

## 2019-04-26 DIAGNOSIS — E876 Hypokalemia: Secondary | ICD-10-CM | POA: Diagnosis not present

## 2019-04-26 HISTORY — PX: PORT-A-CATH REMOVAL: SHX5289

## 2019-04-26 SURGERY — REMOVAL PORT-A-CATH
Anesthesia: Monitor Anesthesia Care | Site: Chest | Laterality: Right

## 2019-04-26 MED ORDER — ENSURE PRE-SURGERY PO LIQD: 296.0000 mL | Freq: Once | ORAL | Status: DC

## 2019-04-26 MED ORDER — KETOROLAC TROMETHAMINE 15 MG/ML IJ SOLN: 15.0000 mg | Freq: Once | INTRAMUSCULAR | Status: DC

## 2019-04-26 MED ORDER — ACETAMINOPHEN 500 MG PO TABS
1000.0000 mg | ORAL_TABLET | ORAL | Status: AC
  Administered 2019-04-26: 1000 mg via ORAL

## 2019-04-26 MED ORDER — DEXAMETHASONE SODIUM PHOSPHATE 10 MG/ML IJ SOLN
INTRAMUSCULAR | Status: AC
  Filled 2019-04-26: qty 1

## 2019-04-26 MED ORDER — PHENYLEPHRINE 40 MCG/ML (10ML) SYRINGE FOR IV PUSH (FOR BLOOD PRESSURE SUPPORT)
PREFILLED_SYRINGE | INTRAVENOUS | Status: AC
  Filled 2019-04-26: qty 10

## 2019-04-26 MED ORDER — PHENYLEPHRINE HCL (PRESSORS) 10 MG/ML IV SOLN
INTRAVENOUS | Status: DC | PRN
  Administered 2019-04-26 (×3): 80 ug via INTRAVENOUS

## 2019-04-26 MED ORDER — LACTATED RINGERS IV SOLN: INTRAVENOUS | Status: DC

## 2019-04-26 MED ORDER — FENTANYL CITRATE (PF) 100 MCG/2ML IJ SOLN: 25.0000 ug | INTRAMUSCULAR | Status: DC | PRN

## 2019-04-26 MED ORDER — LIDOCAINE 2% (20 MG/ML) 5 ML SYRINGE
INTRAMUSCULAR | Status: AC
  Filled 2019-04-26: qty 5

## 2019-04-26 MED ORDER — FENTANYL CITRATE (PF) 100 MCG/2ML IJ SOLN
INTRAMUSCULAR | Status: DC | PRN
  Administered 2019-04-26: 50 ug via INTRAVENOUS

## 2019-04-26 MED ORDER — BUPIVACAINE HCL (PF) 0.25 % IJ SOLN
INTRAMUSCULAR | Status: DC | PRN
  Administered 2019-04-26: 10 mL

## 2019-04-26 MED ORDER — OXYCODONE HCL 5 MG/5ML PO SOLN: 5.0000 mg | Freq: Once | ORAL | Status: DC | PRN

## 2019-04-26 MED ORDER — FENTANYL CITRATE (PF) 100 MCG/2ML IJ SOLN
INTRAMUSCULAR | Status: AC
  Filled 2019-04-26: qty 2

## 2019-04-26 MED ORDER — ACETAMINOPHEN 160 MG/5ML PO SOLN: 325.0000 mg | ORAL | Status: DC | PRN

## 2019-04-26 MED ORDER — MEPERIDINE HCL 25 MG/ML IJ SOLN: 6.2500 mg | INTRAMUSCULAR | Status: DC | PRN

## 2019-04-26 MED ORDER — ACETAMINOPHEN 325 MG PO TABS: 325.0000 mg | ORAL_TABLET | ORAL | Status: DC | PRN

## 2019-04-26 MED ORDER — ONDANSETRON HCL 4 MG/2ML IJ SOLN
INTRAMUSCULAR | Status: AC
  Filled 2019-04-26: qty 2

## 2019-04-26 MED ORDER — PROPOFOL 10 MG/ML IV BOLUS
INTRAVENOUS | Status: DC | PRN
  Administered 2019-04-26 (×2): 40 mg via INTRAVENOUS

## 2019-04-26 MED ORDER — PROPOFOL 10 MG/ML IV BOLUS
INTRAVENOUS | Status: AC
  Filled 2019-04-26: qty 20

## 2019-04-26 MED ORDER — OXYCODONE HCL 5 MG PO TABS: 5.0000 mg | ORAL_TABLET | Freq: Once | ORAL | Status: DC | PRN

## 2019-04-26 MED ORDER — ACETAMINOPHEN 500 MG PO TABS
ORAL_TABLET | ORAL | Status: AC
  Filled 2019-04-26: qty 2

## 2019-04-26 MED ORDER — ONDANSETRON HCL 4 MG/2ML IJ SOLN: 4.0000 mg | Freq: Once | INTRAMUSCULAR | Status: DC | PRN

## 2019-04-26 MED ORDER — MIDAZOLAM HCL 5 MG/5ML IJ SOLN
INTRAMUSCULAR | Status: DC | PRN
  Administered 2019-04-26: 1 mg via INTRAVENOUS

## 2019-04-26 MED ORDER — ONDANSETRON HCL 4 MG/2ML IJ SOLN
INTRAMUSCULAR | Status: DC | PRN
  Administered 2019-04-26: 4 mg via INTRAVENOUS

## 2019-04-26 MED ORDER — DEXAMETHASONE SODIUM PHOSPHATE 10 MG/ML IJ SOLN
INTRAMUSCULAR | Status: DC | PRN
  Administered 2019-04-26: 5 mg via INTRAVENOUS

## 2019-04-26 MED ORDER — MIDAZOLAM HCL 2 MG/2ML IJ SOLN
INTRAMUSCULAR | Status: AC
  Filled 2019-04-26: qty 2

## 2019-04-26 SURGICAL SUPPLY — 36 items
ADH SKN CLS APL DERMABOND .7 (GAUZE/BANDAGES/DRESSINGS) ×1
APL PRP STRL LF DISP 70% ISPRP (MISCELLANEOUS) ×1
BLADE SURG 15 STRL LF DISP TIS (BLADE) ×1 IMPLANT
BLADE SURG 15 STRL SS (BLADE) ×2
CHLORAPREP W/TINT 26 (MISCELLANEOUS) ×2 IMPLANT
COVER BACK TABLE REUSABLE LG (DRAPES) ×2 IMPLANT
COVER MAYO STAND REUSABLE (DRAPES) ×2 IMPLANT
COVER WAND RF STERILE (DRAPES) IMPLANT
DECANTER SPIKE VIAL GLASS SM (MISCELLANEOUS) ×1 IMPLANT
DERMABOND ADVANCED (GAUZE/BANDAGES/DRESSINGS) ×1
DERMABOND ADVANCED .7 DNX12 (GAUZE/BANDAGES/DRESSINGS) ×1 IMPLANT
DRAPE LAPAROTOMY 100X72 PEDS (DRAPES) ×2 IMPLANT
DRAPE UTILITY XL STRL (DRAPES) ×2 IMPLANT
ELECT COATED BLADE 2.86 ST (ELECTRODE) ×2 IMPLANT
ELECT REM PT RETURN 9FT ADLT (ELECTROSURGICAL) ×2
ELECTRODE REM PT RTRN 9FT ADLT (ELECTROSURGICAL) ×1 IMPLANT
GLOVE BIO SURGEON STRL SZ7 (GLOVE) ×2 IMPLANT
GLOVE BIOGEL PI IND STRL 6.5 (GLOVE) IMPLANT
GLOVE BIOGEL PI IND STRL 7.5 (GLOVE) ×1 IMPLANT
GLOVE BIOGEL PI INDICATOR 6.5 (GLOVE) ×1
GLOVE BIOGEL PI INDICATOR 7.5 (GLOVE) ×1
GLOVE ECLIPSE 6.5 STRL STRAW (GLOVE) ×1 IMPLANT
GOWN STRL REUS W/ TWL LRG LVL3 (GOWN DISPOSABLE) ×2 IMPLANT
GOWN STRL REUS W/ TWL XL LVL3 (GOWN DISPOSABLE) IMPLANT
GOWN STRL REUS W/TWL LRG LVL3 (GOWN DISPOSABLE) ×2
GOWN STRL REUS W/TWL XL LVL3 (GOWN DISPOSABLE) ×2
NDL HYPO 25X1 1.5 SAFETY (NEEDLE) ×1 IMPLANT
NEEDLE HYPO 25X1 1.5 SAFETY (NEEDLE) ×2 IMPLANT
PACK BASIN DAY SURGERY FS (CUSTOM PROCEDURE TRAY) ×2 IMPLANT
PENCIL SMOKE EVACUATOR (MISCELLANEOUS) ×2 IMPLANT
SLEEVE SCD COMPRESS KNEE MED (MISCELLANEOUS) IMPLANT
STRIP CLOSURE SKIN 1/2X4 (GAUZE/BANDAGES/DRESSINGS) ×1 IMPLANT
SUT MNCRL AB 4-0 PS2 18 (SUTURE) ×2 IMPLANT
SUT VIC AB 3-0 SH 27 (SUTURE) ×2
SUT VIC AB 3-0 SH 27X BRD (SUTURE) ×1 IMPLANT
SYR CONTROL 10ML LL (SYRINGE) ×2 IMPLANT

## 2019-04-26 NOTE — Interval H&P Note (Signed)
History and Physical Interval Note:  04/26/2019 1:28 PM  Patricia Chandler  has presented today for surgery, with the diagnosis of RIGHT BREAST CANCER.  The various methods of treatment have been discussed with the patient and family. After consideration of risks, benefits and other options for treatment, the patient has consented to  Procedure(s): REMOVAL PORT-A-CATH (N/A) as a surgical intervention.  The patient's history has been reviewed, patient examined, no change in status, stable for surgery.  I have reviewed the patient's chart and labs.  Questions were answered to the patient's satisfaction.     Rolm Bookbinder

## 2019-04-26 NOTE — Op Note (Signed)
Preop dx: breast cancer, no longer needs venous access Postop dx: saa Procedure: port removal Surgeon: Dr Serita Grammes EBL: minimal Anesthesia: local with mac Specimens none Drains none Complications none Sponge and needle count correct dispo to recovery stable  Indications: This is 3 yof who no longer needs port for venous access for breast cancer. Discussed port removal under local MAC per her choice.  Procedure After informed consent obtained patient was taken to the OR. She was placed under MAC.  She was prepped and draped in standard sterile surgical fashion.  Timeout was performed.  I infiltrated marcaine in the old port incision.  I reentered the old incision and the removed the port and the line in entirety. All suture material removed.  I then obtained hemostasis.  I closed with 3-0 vicryl and 4-0 monocryl.  Glue and steristrips applied. Tolerated well and transferred to recovery stable.

## 2019-04-26 NOTE — Anesthesia Preprocedure Evaluation (Signed)
Anesthesia Evaluation  Patient identified by MRN, date of birth, ID band Patient awake    Reviewed: Allergy & Precautions, NPO status , Patient's Chart, lab work & pertinent test results  Airway Mallampati: I       Dental no notable dental hx. (+) Teeth Intact   Pulmonary neg pulmonary ROS,    Pulmonary exam normal breath sounds clear to auscultation       Cardiovascular negative cardio ROS Normal cardiovascular exam Rhythm:Regular Rate:Normal     Neuro/Psych negative neurological ROS     GI/Hepatic Neg liver ROS,   Endo/Other  negative endocrine ROS  Renal/GU negative Renal ROS  negative genitourinary   Musculoskeletal   Abdominal Normal abdominal exam  (+)   Peds  Hematology   Anesthesia Other Findings   Reproductive/Obstetrics                             Anesthesia Physical Anesthesia Plan  ASA: II  Anesthesia Plan: MAC   Post-op Pain Management:    Induction:   PONV Risk Score and Plan: 2 and Ondansetron  Airway Management Planned: Nasal Cannula, Natural Airway and Simple Face Mask  Additional Equipment: None  Intra-op Plan:   Post-operative Plan:   Informed Consent: I have reviewed the patients History and Physical, chart, labs and discussed the procedure including the risks, benefits and alternatives for the proposed anesthesia with the patient or authorized representative who has indicated his/her understanding and acceptance.       Plan Discussed with: CRNA  Anesthesia Plan Comments:         Anesthesia Quick Evaluation

## 2019-04-26 NOTE — H&P (Signed)
Patricia Chandler is an 64 y.o. female.   Chief Complaint: *no longer needs venous access HPI: 78 yof completed therapy for breast cancer, desires port removal.   Past Medical History:  Diagnosis Date  . Anemia    due to chemo  . Anxiety   . Breast cancer ([place]) dx'd 2017   right  . Cardiomyopathy ([place])    mild, per cardiology note; due to Herceptin  . Dental crowns present   . Family history of adverse reaction to anesthesia    pt's daughter has hx. of post-op nausea  . GERD (gastroesophageal reflux disease)   . Heart murmur   . History of breast cancer 2017   right  . History of chemotherapy    finished chemo 04/15/2016  . Personal history of chemotherapy   . Rash 08/04/2016   under right arm - states recent tick bite; to see MD 08/05/2016  . Recurrent breast cancer, right ([place]) dx'd 10/2018  . Runny nose 06/06/2016   clear drainage, per pt.    Past Surgical History:  Procedure Laterality Date  . AUGMENTATION MAMMAPLASTY    . BREAST BIOPSY Right 11/30/2018   Procedure: RIGHT BREAST CHEST WALL RECURRENCE EXCISION;  Surgeon: Rolm Bookbinder, MD;  Location: [facility];  Service: General;  Laterality: Right;  . BREAST ENHANCEMENT SURGERY Bilateral 2005  . breast mass removal Right 11/30/2018  . BREAST RECONSTRUCTION WITH PLACEMENT OF TISSUE EXPANDER AND FLEX HD (ACELLULAR HYDRATED DERMIS) Right 05/19/2016   Procedure: RIGHT BREAST RECONSTRUCTION WITH PLACEMENT OF TISSUE EXPANDER AND  ALLODERM.;  Surgeon: Irene Limbo, MD;  Location: [facility];  Service: Plastics;  Laterality: Right;  . COLONOSCOPY    . COLONOSCOPY WITH PROPOFOL  10/02/2014  . DEBRIDEMENT AND CLOSURE WOUND Right 06/08/2016   Procedure: DEBRIDEMENT RIGHT MASTECTOMY FLAP, TISSUE EXPANSION OF RIGHT CHEST;  Surgeon: Irene Limbo, MD;  Location: [facility];  Service: Plastics;  Laterality: Right;  . MASTECTOMY    . MASTOPEXY Left 08/11/2016   Procedure: LEFT BREAST MASTOPEXY;  Surgeon: Irene Limbo, MD;  Location: [facility];  Service: Plastics;  Laterality: Left;  . NIPPLE SPARING MASTECTOMY/SENTINAL LYMPH NODE BIOPSY/RECONSTRUCTION/PLACEMENT OF TISSUE EXPANDER Right 05/19/2016   Procedure: RIGHT NIPPLE SPARING MASTECTOMY WITH SENTINAL LYMPH NODE BIOPSY WITH BLUE DYE INJECTION;  Surgeon: Rolm Bookbinder, MD;  Location: [facility];  Service: General;  Laterality: Right;  . PORTACATH PLACEMENT N/A 12/30/2015   Procedure: INSERTION PORT-A-CATH WITH [place];  Surgeon: Rolm Bookbinder, MD;  Location: [facility];  Service: General;  Laterality: N/A;  . PORTACATH PLACEMENT N/A 11/30/2018   Procedure: INSERTION PORT-A-CATH WITH ULTRASOUND;  Surgeon: Rolm Bookbinder, MD;  Location: [facility];  Service: General;  Laterality: N/A;  . REDUCTION MAMMAPLASTY    . REMOVAL OF TISSUE EXPANDER AND PLACEMENT OF IMPLANT Right 08/11/2016   Procedure: REMOVAL OF RIGHT  TISSUE EXPANDER AND PLACEMENT OF IMPLANT;  Surgeon: Irene Limbo, MD;  Location: [facility];  Service: Plastics;  Laterality: Right;    Family History  Problem Relation Age of Onset  . Breast cancer Maternal Aunt        dx in her 25s  . Stroke Father        late 6s  . Hypertension Father   . Pulmonary fibrosis Mother   . Breast cancer Paternal Aunt 78  . Cancer Paternal Grandmother        abdominal; cervical vs uterine dx in her 52s  . Stomach cancer  Maternal Grandmother   . Parkinson's disease Maternal Grandfather   . Anesthesia problems Daughter        post-op nausea  . Colon cancer Neg Hx   . Esophageal cancer Neg Hx   . Rectal cancer Neg Hx    Social History:  reports that she has never smoked. She has never used smokeless tobacco. She reports current alcohol use. She reports that she does not use drugs.  Allergies: No Known Allergies  Medications Prior to Admission  Medication Sig Dispense Refill  . Biotin 1 MG CAPS Take by mouth.    . calcium-vitamin D  (OSCAL WITH D) 500-200 MG-UNIT tablet Take 1 tablet by mouth daily with breakfast.    . dicyclomine (BENTYL) 10 MG capsule Take 1 capsule (10 mg total) by mouth 4 (four) times daily -  before meals and at bedtime. (Patient taking differently: Take 10 mg by mouth as needed. ) 60 capsule 1  . Lactobacillus (PROBIOTIC ACIDOPHILUS PO) Take 1 capsule by mouth daily.     Marland Kitchen omeprazole (PRILOSEC) 20 MG capsule Take 20 mg by mouth daily as needed (heartburn).    . Rivaroxaban 15 & 20 MG TBPK Follow package directions: Take one 15mg  tablet by mouth twice a day. On day 22, switch to one 20mg  tablet once a day. Take with food. 51 each 0  . zolpidem (AMBIEN) 5 MG tablet Take 1 tablet (5 mg total) by mouth at bedtime. 30 tablet 0  . cyclophosphamide in sodium chloride 0.9 % 250 mL Inject into the vein as directed. Once every three weeks  Not sure of dose    . denosumab (PROLIA) 60 MG/ML SOLN injection Inject 60 mg into the skin every 6 (six) months. Administer in upper arm, thigh, or abdomen    . dexamethasone (DECADRON) 4 MG tablet Take 1 tablet (4 mg total) by mouth 2 (two) times daily. Take 1 tablet day before chemo and 1 tablet day after chemo with food 8 tablet 0  . DOCEtaxel (TAXOTERE IV) Inject into the vein as directed. Once every three weeks    . lidocaine-prilocaine (EMLA) cream Apply to affected area once 30 g 3  . LORazepam (ATIVAN) 0.5 MG tablet Take 1 tablet (0.5 mg total) by mouth at bedtime as needed for sleep. 30 tablet 3  . ondansetron (ZOFRAN) 8 MG tablet Take 1 tablet (8 mg total) by mouth 2 (two) times daily as needed for refractory nausea / vomiting. Start on day 3 after chemo. 30 tablet 1  . oxyCODONE-acetaminophen (PERCOCET/ROXICET) 5-325 MG tablet Take 1 tablet by mouth every 6 (six) hours as needed for severe pain. 30 tablet 0  . prochlorperazine (COMPAZINE) 10 MG tablet Take 1 tablet (10 mg total) by mouth every 6 (six) hours as needed (Nausea or vomiting). 30 tablet 1    No results  found for this or any previous visit (from the past 48 hour(s)). No results found.  Review of Systems  All other systems reviewed and are negative.   Blood pressure 114/72, pulse (!) 102, temperature 97.9 F (36.6 C), temperature source Temporal, resp. rate 18, height 5\' 3"  (1.6 m), weight 61.1 kg, SpO2 100 %. Physical Exam  cv rrr Lungs clear Port right chest Assessment/Plan Breast cancer, no longer needs venous access -port removal  Rolm Bookbinder, MD 04/26/2019, 1:27 PM

## 2019-04-26 NOTE — Discharge Instructions (Signed)
.No Tylenol before 5:30pm today.            Always review your discharge instruction sheet given to you by the facility where your surgery was performed.   1. A prescription for pain medication may be given to you upon discharge. Take your pain medication as prescribed, if needed. If narcotic pain medicine is not needed, then you make take acetaminophen (Tylenol) or ibuprofen (Advil) as needed.  2. Take your usually prescribed medications unless otherwise directed. 3. If you need a refill on your pain medication, please contact our office. All narcotic pain medicine now requires a paper prescription.  Phoned in and fax refills are no longer allowed by law.  Prescriptions will not be filled after 5 pm or on weekends.  4. You should follow a light diet for the remainder of the day after your procedure. 5. Most patients will experience some mild swelling and/or bruising in the area of the incision. It may take several days to resolve. 6. It is common to experience some constipation if taking pain medication after surgery. Increasing fluid intake and taking a stool softener (such as Colace) will usually help or prevent this problem from occurring. A mild laxative (Milk of Magnesia or Miralax) should be taken according to package directions if there are no bowel movements after 48 hours.  7. Unless discharge instructions indicate otherwise, you may remove your bandages 48 hours after surgery, and you may shower at that time. You may have steri-strips (small white skin tapes) in place directly over the incision.  These strips should be left on the skin for 7-10 days.  If your surgeon used Dermabond (skin glue) on the incision, you may shower in 24 hours.  The glue will flake off over the next 2-3 weeks.   8. ACTIVITIES:  Limit activity involving your arms for the next 72 hours. Do no strenuous exercise or activity for 1 week. You may drive when you are no longer taking prescription pain medication,  you can comfortably wear a seatbelt, and you can maneuver your car. 9.You may need to see your doctor in the office for a follow-up appointment.  Please       check with your doctor.   WHEN TO CALL YOUR DOCTOR [phone]): 1. Fever over 101.0 2. Chills 3. Continued bleeding from incision 4. Increased redness and tenderness at the site 5. Shortness of breath, difficulty breathing   The clinic staff is available to answer your questions during regular business hours. Please dont hesitate to call and ask to speak to one of the nurses or medical assistants for clinical concerns. If you have a medical emergency, go to the nearest emergency room or call 911.  A surgeon from [facility] is always on call at the hospital.     For further information, please visit [email or web address]    Post Anesthesia Home Care Instructions  Activity: Get plenty of rest for the remainder of the day. A responsible individual must stay with you for 24 hours following the procedure.  For the next 24 hours, DO NOT: -Drive a car -Paediatric nurse -Drink alcoholic beverages -Take any medication unless instructed by your physician -Make any legal decisions or sign important papers.  Meals: Start with liquid foods such as gelatin or soup. Progress to regular foods as tolerated. Avoid greasy, spicy, heavy foods. If nausea and/or vomiting occur, drink only clear liquids until the nausea and/or vomiting subsides. Call your physician if vomiting continues.  Special Instructions/Symptoms: Your  throat may feel dry or sore from the anesthesia or the breathing tube placed in your throat during surgery. If this causes discomfort, gargle with warm salt water. The discomfort should disappear within 24 hours.  If you had a scopolamine patch placed behind your ear for the management of post- operative nausea and/or vomiting:  1. The medication in the patch is effective for 72 hours, after which  it should be removed.  Wrap patch in a tissue and discard in the trash. Wash hands thoroughly with soap and water. 2. You may remove the patch earlier than 72 hours if you experience unpleasant side effects which may include dry mouth, dizziness or visual disturbances. 3. Avoid touching the patch. Wash your hands with soap and water after contact with the patch.

## 2019-04-26 NOTE — Transfer of Care (Signed)
Immediate Anesthesia Transfer of Care Note  Patient: Patricia Chandler  Procedure(s) Performed: REMOVAL PORT-A-CATH (Right Chest)  Patient Location: PACU  Anesthesia Type:General  Level of Consciousness: awake, alert , oriented and patient cooperative  Airway & Oxygen Therapy: Patient Spontanous Breathing  Post-op Assessment: Report given to RN, Post -op Vital signs reviewed and stable and Patient moving all extremities X 4  Post vital signs: Reviewed and stable  Last Vitals:  Vitals Value Taken Time  BP    Temp    Pulse 76 04/26/19 1403  Resp 16 04/26/19 1403  SpO2 97 % 04/26/19 1403  Vitals shown include unvalidated device data.  Last Pain:  Vitals:   04/26/19 1130  TempSrc: Temporal  PainSc: 0-No pain         Complications: No apparent anesthesia complications

## 2019-04-26 NOTE — Anesthesia Postprocedure Evaluation (Signed)
Anesthesia Post Note  Patient: Patricia Chandler  Procedure(s) Performed: REMOVAL PORT-A-CATH (Right Chest)     Patient location during evaluation: PACU Anesthesia Type: MAC Level of consciousness: awake and alert and oriented Pain management: pain level controlled Vital Signs Assessment: post-procedure vital signs reviewed and stable Respiratory status: spontaneous breathing, nonlabored ventilation and respiratory function stable Cardiovascular status: stable and blood pressure returned to baseline Postop Assessment: no apparent nausea or vomiting Anesthetic complications: no    Last Vitals:  Vitals:   04/26/19 1403 04/26/19 1406  BP: (!) 82/56 (!) 87/62  Pulse: 76 80  Resp: 16 17  Temp: 36.6 C   SpO2: 97% 98%    Last Pain:  Vitals:   04/26/19 1403  TempSrc:   PainSc: 0-No pain                 Patricia Chandler A.

## 2019-04-27 ENCOUNTER — Other Ambulatory Visit: Payer: Self-pay

## 2019-04-27 ENCOUNTER — Encounter: Payer: Self-pay | Admitting: *Deleted

## 2019-04-27 MED ORDER — ZOLPIDEM TARTRATE 5 MG PO TABS: 5.0000 mg | ORAL_TABLET | Freq: Every day | ORAL | 0 refills | Status: DC

## 2019-05-01 ENCOUNTER — Ambulatory Visit: Payer: Medicare Other | Admitting: Radiation Oncology

## 2019-05-01 ENCOUNTER — Ambulatory Visit: Payer: Medicare Other

## 2019-05-01 NOTE — Progress Notes (Signed)
Radiation Oncology         [phone]) [phone] ________________________________  Name: Patricia Chandler MRN: [id number]  Date: 05/03/2019  DOB: [birth year]  Re-Evaluation Note  CC: [name], MD  Nicholas Lose, MD    ICD-10-CM   1. 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 breast in female, estrogen receptor positive ([place])  C50.211    Z17.0     Diagnosis: Recurrent Stage IIA (pT2, pN0, cM0) Right Breast UIQ, Invasive Ductal Carcinoma, ER+ / PR- / Her2-, Grade 2  History of Stage IA (T1, cN0) Right Breast UIQ, Multifocal Invasive Ductal Carcinoma, ER+ / PR+ / Her2+, Grade 2-3  Narrative:  The patient returns today to discuss radiation treatment options. She was seen in consultation on 12/19/2015. At that time, patient opted to undergo neo-adjuvant chemotherapy with [facility] Perjeta x 6 cycles from 12/31/2015 - 04/15/2016. She then completed Herceptin and Perjeta maintenance for one year.  Patient had an MRI of bilateral breasts done on 04/16/2016, which showed a near complete response to therapy. The two previously biopsied malignancies were no longer visualized and the third satellite lesion was much small in the interval.   Patient also underwent a right mastectomy and right breast reconstruction with tissue expander on 05/19/2016. Biopsy from procedure showed grade 3 invasive ductal carcinoma, low-grade ductal carcinoma in situ, and lymphovascular invasion. Surgical resection margins were negative for carcinoma. Right nipple and right axillary sentinel node did not show any evidence of malignancy.  Patient began anti-estrogen oral therapy with Letrozole 2.5 mg daily on 06/22/2016.  In July of 2020, the patient palpated a lump in the UIQ at the surgical scar of the reconstructed right breast. Ultrasound of right breast on 10/31/2018 showed 1.8 cm mass in the 2 o'clock position of the reconstructed right breast with ultrasound features highly suspicious for recurrent  breast cancer.  Biopsy on 11/01/2018 showed invasive mammary carcinoma, grade 2-3. Prognostic indicators: Estrogen Receptor, 90% positive with strong staining intensity; Progesterone Receptor, 0% negative; Proliferation Marker Ki67, 40%; Her2 negative.  Oncotype testing done on 11/11/2018 had a score of 52: greater than 59% risk of distant recurrence with hormone therapy alone, chemo benefit greater than 15%.   Patient underwent right lumpectomy on 11/30/2018. Biopsy from procedure showed invasive ductal carcinoma, grade 3, spanning 2.1 cm and focally present at the posterior margin. Carcinoma involves skeletal muscle and perineural invasion was identified.  Patient is currently on adjuvant chemotherapy with Cytoxan and Taxotere under the care of Dr. Lindi Adie. She underwent cycle six on 04/07/2019. Patient has experienced mild body aches, worsening fatigue, headaches, chemotherapy-induced anemia, jugular vein DVT related to the port, and shortness of breath with exertion.  On review of systems, the patient reports improving fatigue. She denies pain along the right chest region and any other symptoms.    Allergies:  has No Known Allergies.  Meds: Current Outpatient Medications  Medication Sig Dispense Refill  . Biotin 1 MG CAPS Take by mouth.    . calcium-vitamin D (OSCAL WITH D) 500-200 MG-UNIT tablet Take 1 tablet by mouth daily with breakfast.    . denosumab (PROLIA) 60 MG/ML SOLN injection Inject 60 mg into the skin every 6 (six) months. Administer in upper arm, thigh, or abdomen    . Lactobacillus (PROBIOTIC ACIDOPHILUS PO) Take 1 capsule by mouth daily.     Marland Kitchen LORazepam (ATIVAN) 0.5 MG tablet Take 1 tablet (0.5 mg total) by mouth at bedtime as needed for sleep. 30 tablet 3  . omeprazole (PRILOSEC)  20 MG capsule Take 20 mg by mouth daily as needed (heartburn).    . zolpidem (AMBIEN) 5 MG tablet Take 1 tablet (5 mg total) by mouth at bedtime. 30 tablet 0   No current facility-administered  medications for this encounter.    Physical Findings: The patient is in no acute distress. Patient is alert and oriented.  weight is 133 lb 12.8 oz (60.7 kg). Her temperature is 97.9 F (36.6 C). Her blood pressure is 101/70 and her pulse is 104 (abnormal). Her respiration is 20 and oxygen saturation is 99%.   Lungs are clear to auscultation bilaterally. Heart has regular rate and rhythm. No palpable cervical, supraclavicular, or axillary adenopathy. Abdomen soft, non-tender, normal bowel sounds. Left breast: no palpable mass, nipple discharge or bleeding.  subpectoral implant in place.  Signs of previous cosmetic surgery on the left side Right reconstructed breast: No palpable or visible signs of recurrence.  ~5-6 cm scar in the upper inner aspect of the chest adjacent to the implant which was the site of recurrence.  Nipple areolar complex surgically absent secondary to necrosis.  Lab Findings: Lab Results  Component Value Date   WBC 5.4 04/07/2019   HGB 9.5 (L) 04/07/2019   HCT 29.0 (L) 04/07/2019   MCV 95.7 04/07/2019   PLT 289 04/07/2019    Radiographic Findings: No results found.  Impression: Recurrent Stage IIA (pT2, pN0, cM0) Right Breast UIQ, Invasive Ductal Carcinoma, ER+ / PR- / Her2-, Grade 2  I discussed in detail radiation options for management by the patient's recurrence.  Options would be focal radiation therapy directed at the surgical bed, site of recurrence.  Given the pathologic findings and the fact that she recurred after full dose chemotherapy,  I  would recommend comprehensive radiation therapy including the reconstructed breast and axillary region.  We discussed the indications of comprehensive radiation therapy and how that will factor into potential capsular fibrosis with potential chronic pain and requirements for implant removal.  Patient is undecided at this point which treatment approach she would like to proceed with and would like for me to confer with  colleagues concerning recommendations in this situation.  I discussed treatment options with Dr. Lindi Adie as well as my colleagues in radiation oncology.  Everyone was in agreement that she should proceed with comprehensive radiation therapy rather than radiation focused at the site of recurrence.  Plan: The patient will be scheduled for simulation and planning in the near future and will communicate recommendations to her concerning recommendation for comprehensive chest wall and nodal radiation therapy.  Anticipate 6-1/2 to 7 weeks of radiation therapy.  -----------------------------------  Blair Promise, PhD, MD  This document serves as a record of services personally performed by Gery Pray, MD. It was created on his behalf by Clerance Lav, a trained medical scribe. The creation of this record is based on the scribe's personal observations and the provider's statements to them. This document has been checked and approved by the attending provider.

## 2019-05-02 ENCOUNTER — Ambulatory Visit (INDEPENDENT_AMBULATORY_CARE_PROVIDER_SITE_OTHER): Payer: Medicare Other | Admitting: Psychology

## 2019-05-02 DIAGNOSIS — F064 Anxiety disorder due to known physiological condition: Secondary | ICD-10-CM

## 2019-05-03 ENCOUNTER — Ambulatory Visit
Admission: RE | Admit: 2019-05-03 | Discharge: 2019-05-03 | Disposition: A | Discharge status: Home / Self Care | Payer: Medicare Other | Source: Ambulatory Visit | Attending: Radiation Oncology | Admitting: Radiation Oncology

## 2019-05-03 ENCOUNTER — Other Ambulatory Visit: Payer: Self-pay

## 2019-05-03 ENCOUNTER — Encounter: Payer: Self-pay | Admitting: Radiation Oncology

## 2019-05-03 VITALS — BP 101/70 | HR 104 | Temp 97.9°F | Resp 20 | Wt 133.8 lb

## 2019-05-03 DIAGNOSIS — C50211 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 female breast: Secondary | ICD-10-CM | POA: Diagnosis not present

## 2019-05-03 DIAGNOSIS — Z79899 Other long term (current) drug therapy: Secondary | ICD-10-CM | POA: Insufficient documentation

## 2019-05-03 DIAGNOSIS — Z170 Estrogen receptor positive status [ER+]: Secondary | ICD-10-CM | POA: Insufficient documentation

## 2019-05-03 DIAGNOSIS — Z9889 Other specified postprocedural states: Secondary | ICD-10-CM | POA: Diagnosis not present

## 2019-05-03 DIAGNOSIS — Z9011 Acquired absence of right breast and nipple: Secondary | ICD-10-CM | POA: Insufficient documentation

## 2019-05-03 DIAGNOSIS — Z510 Encounter for antineoplastic radiation therapy: Secondary | ICD-10-CM | POA: Insufficient documentation

## 2019-05-03 DIAGNOSIS — Z79811 Long term (current) use of aromatase inhibitors: Secondary | ICD-10-CM | POA: Insufficient documentation

## 2019-05-03 NOTE — Progress Notes (Signed)
Location of Breast Cancer: Recurrent Stage IIA (pT2, pN0, cM0) Right Breast UIQ, Invasive Ductal Carcinoma, ER+ / PR- / Her2-, Grade 2  Histology per Pathology Report: 11/30/18: Diagnosis Breast, lumpectomy, Right - INVASIVE DUCTAL CARCINOMA, GRADE III/III, SPANNING 2.1 CM. - CARCINOMA IS FOCALLY PRESENT AT THE POSTERIOR MARGIN. - CARCINOMA INVOLVES SKELETAL MUSCLE. - PERINEURAL INVASION IS IDENTIFIED.   Receptor Status: ER(90%), PR (0%), Her2-neu (negative, 1+), Ki-(40%)  Did patient present with symptoms (if so, please note symptoms) or was this found on screening mammography?: In July of 2020, the patient palpated a lump in the UIQ at the surgical scar of the reconstructed right breast. Ultrasound of right breast on 10/31/2018 showed 1.8 cm mass in the 2 o'clock position of the right breast with ultrasound features highly suspicious for recurrent breast cancer.  Biopsy on 11/01/2018 showed invasive mammary carcinoma, grade 2-3. Prognostic indicators: Estrogen Receptor, 90% positive with strong staining intensity; Progesterone Receptor, 0% negative; Proliferation Marker Ki67, 40%; Her2 negative.  Oncotype testing done on 11/11/2018 had a score of 52: greater than 59% risk of distant recurrence with hormone therapy alone, chemo benefit greater than 15%.   Patient underwent right lumpectomy on 11/30/2018. Biopsy from procedure showed invasive ductal carcinoma, grade 3, spanning 2.1 cm and focally present at the posterior margin. Carcinoma involves skeletal muscle and perineural invasion was identified.  Patient is currently on adjuvant chemotherapy with Cytoxan and Taxotere under the care of Dr. Lindi Adie. She underwent cycle six on 04/07/2019. Patient has experienced mild body aches, worsening fatigue, headaches, chemotherapy-induced anemia, jugular vein DVT related to the port, and shortness of breath with exertion.  Past/Anticipated interventions by surgeon, if any: 11/30/18: Procedure: 1.  Right breast lumpectomy 2. Insertion of right IJ port with US guidance Surgeon: Dr Serita Grammes  Past/Anticipated interventions by medical oncology, if any: Chemotherapy Per Dr. Lindi Adie 04/07/19:  Breast cancer: Subcutaneous disease: 1.8 cm measured by ultrasound Biopsy revealed grade 2-3 IDC ER 90% PR 0% HER-2 negative Ki-67 40% 11/30/2018 right lumpectomy [name]): IDC, grade 3, 2.1cm, perineural invasion present, carcinoma present focally at the posterior margin and involving skeletal muscle. Oncotype score 52: Greater than 39% risk of distant recurrence with hormone therapy alone, chemo benefit greater than 15%  Recommendation: 1.Surgery to remove the tumor8/03/2019 2.Adjuvant chemotherapy withTaxotere and Cytoxan x6 cyclesstarted 12/30/2018 3.Adjuvant radiation 4.Follow-up adjuvant antiestrogen therapy ------------------------------------------------------------------------------------------------------------------------------------ Current treatment: Cycle6-day 1Taxotere and Cytoxan Labs reviewed  Chemo toxicities: 1.Mild body aches 2.Fatigue:Worsening with each treatment 3. Headaches: Probably due to Aloxi 4.Chemotherapy-induced anemia: Hemoglobin is9.9 We will continue to watch and monitor this. 5.Jugular vein DVT related to the port: Currently on blood thinners swelling has markedly improved. 6.Shortness of breath to exertion: Due to deconditioning from weight loss and fatigue  Lymphedema issues, if any:  Pt denies lymphedema  Pain issues, if any:  Pt denies c/o pain.  SAFETY ISSUES:  Prior radiation? no  Pacemaker/ICD? no  Possible current pregnancy?no  Is the patient on methotrexate? no  Current Complaints / other details:  Pt presents today for reconsult with Dr. Sondra Come for Radiation Oncology. Pt is accompanied by husband. Pt is adamant that she wants "frank conversation" about XRT.  BP 101/70 (BP Location: Left Arm, Patient  Position: Sitting, Cuff Size: Normal)   Pulse (!) 104   Temp 97.9 F (36.6 C)   Resp 20   Wt 133 lb 12.8 oz (60.7 kg)   SpO2 99%   BMI 23.70 kg/m   Wt Readings from Last 3 Encounters:  05/03/19  133 lb 12.8 oz (60.7 kg)  04/26/19 134 lb 11.2 oz (61.1 kg)  04/07/19 138 lb 12.8 oz (63 kg)       Loma Sousa, RN 05/03/2019,10:04 AM

## 2019-05-03 NOTE — Patient Instructions (Signed)
Coronavirus (COVID-19) Are you at risk?  Are you at risk for the Coronavirus (COVID-19)?  To be considered HIGH RISK for Coronavirus (COVID-19), you have to meet the following criteria:  . Traveled to [place], [place], [place], [place] or [place]; or in the [place] to [place], [place], [place], or [place]; and have fever, cough, and shortness of breath within the last 2 weeks of travel OR . Been in close contact with a person diagnosed with COVID-19 within the last 2 weeks and have fever, cough, and shortness of breath . IF YOU DO NOT MEET THESE CRITERIA, YOU ARE CONSIDERED LOW RISK FOR COVID-19.  What to do if you are HIGH RISK for COVID-19?  . If you are having a medical emergency, call 911. . Seek medical care right away. Before you go to a doctor's office, urgent care or emergency department, call ahead and tell them about your recent travel, contact with someone diagnosed with COVID-19, and your symptoms. You should receive instructions from your physician's office regarding next steps of care.  . When you arrive at healthcare provider, tell the healthcare staff immediately you have returned from visiting [place], [place], [place], [place] or [place]; or traveled in the [place] to [place], [place], [place], or [place]; in the last two weeks or you have been in close contact with a person diagnosed with COVID-19 in the last 2 weeks.   . Tell the health care staff about your symptoms: fever, cough and shortness of breath. . After you have been seen by a medical provider, you will be either: o Tested for (COVID-19) and discharged home on quarantine except to seek medical care if symptoms worsen, and asked to  - Stay home and avoid contact with others until you get your results (4-5 days)  - Avoid travel on public transportation if possible (such as bus, train, or airplane) or o Sent to the Emergency Department by EMS for evaluation, COVID-19 testing, and possible  admission depending on your condition and test results.  What to do if you are LOW RISK for COVID-19?  Reduce your risk of any infection by using the same precautions used for avoiding the common cold or flu:  . Wash your hands often with soap and warm water for at least 20 seconds.  If soap and water are not readily available, use an alcohol-based hand sanitizer with at least 60% alcohol.  . If coughing or sneezing, cover your mouth and nose by coughing or sneezing into the elbow areas of your shirt or coat, into a tissue or into your sleeve (not your hands). . Avoid shaking hands with others and consider head nods or verbal greetings only. . Avoid touching your eyes, nose, or mouth with unwashed hands.  . Avoid close contact with people who are sick. . Avoid places or events with large numbers of people in one location, like concerts or sporting events. . Carefully consider travel plans you have or are making. . If you are planning any travel outside or inside the [place], visit the CDC's Travelers' Health webpage for the latest health notices. . If you have some symptoms but not all symptoms, continue to monitor at home and seek medical attention if your symptoms worsen. . If you are having a medical emergency, call 911.   ADDITIONAL HEALTHCARE OPTIONS FOR PATIENTS  [facility] / e-Visit: [email or web address]         [facility]: [phone]     Urgent Care: [phone]                   [facility]: [phone]   

## 2019-05-04 ENCOUNTER — Encounter: Payer: Self-pay | Admitting: *Deleted

## 2019-05-08 ENCOUNTER — Encounter: Payer: Self-pay | Admitting: *Deleted

## 2019-05-08 NOTE — Progress Notes (Signed)
  Radiation Oncology         [phone]) [phone] ________________________________  Name: Patricia Chandler MRN: [id number]  Date: 05/10/2019  DOB: [birth year]  SIMULATION AND TREATMENT PLANNING NOTE   DIAGNOSIS: Recurrent Stage IIA (pT2, pN0, cM0) Right Breast UIQ, Invasive Ductal Carcinoma, ER+ / PR- / Her2-, Grade 2  NARRATIVE:  The patient was brought to the [facility].  Identity was confirmed.  All relevant records and images related to the planned course of therapy were reviewed.  The patient freely provided informed written consent to proceed with treatment after reviewing the details related to the planned course of therapy. The consent form was witnessed and verified by the simulation staff.  Then, the patient was set-up in a stable reproducible  supine position for radiation therapy.  CT images were obtained.  Surface markings were placed.  The CT images were loaded into the planning software.  Then the target and avoidance structures were contoured.  Treatment planning then occurred.  The radiation prescription was entered and confirmed.  Then, I designed and supervised the construction of a total of 7 medically necessary complex treatment devices.  I have requested : 3D Simulation  I have requested a DVH of the following structures: CTB, heart, cord.  I have ordered:CBC  PLAN:  The patient will receive 50 Gy in 25 fractions directed at the right chest wall/reconstructed breast.  The patient will also receive [place] in 23 fractions directed to the right axillary area.  She will then proceed with a boost to the site of recurrence along the medial upper right chest region and continue with a boost dose of 14 Gray for a cumulative dose of 64 Gray.   Optical Surface Tracking Plan:  Since intensity modulated radiotherapy (IMRT) and 3D conformal radiation treatment methods are predicated on accurate and precise positioning for treatment, intrafraction motion monitoring is  medically necessary to ensure accurate and safe treatment delivery.  The ability to quantify intrafraction motion without excessive ionizing radiation dose can only be performed with optical surface tracking. Accordingly, surface imaging offers the opportunity to obtain 3D measurements of patient position throughout IMRT and 3D treatments without excessive radiation exposure.  I am ordering optical surface tracking for this patient's upcoming course of radiotherapy. ________________________________    -----------------------------------  Blair Promise, PhD, MD  This document serves as a record of services personally performed by Gery Pray, MD. It was created on his behalf by Clerance Lav, a trained medical scribe. The creation of this record is based on the scribe's personal observations and the provider's statements to them. This document has been checked and approved by the attending provider.

## 2019-05-09 ENCOUNTER — Encounter: Payer: Self-pay | Admitting: *Deleted

## 2019-05-10 ENCOUNTER — Ambulatory Visit
Admission: RE | Admit: 2019-05-10 | Discharge: 2019-05-10 | Disposition: A | Discharge status: Home / Self Care | Payer: Medicare Other | Source: Ambulatory Visit | Attending: Radiation Oncology | Admitting: Radiation Oncology

## 2019-05-10 ENCOUNTER — Other Ambulatory Visit: Payer: Self-pay

## 2019-05-10 DIAGNOSIS — Z510 Encounter for antineoplastic radiation therapy: Secondary | ICD-10-CM | POA: Diagnosis not present

## 2019-05-10 DIAGNOSIS — C50211 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 female breast: Secondary | ICD-10-CM

## 2019-05-10 DIAGNOSIS — Z170 Estrogen receptor positive status [ER+]: Secondary | ICD-10-CM

## 2019-05-15 ENCOUNTER — Encounter: Payer: Self-pay | Admitting: *Deleted

## 2019-05-15 NOTE — Progress Notes (Signed)
  Radiation Oncology         [phone]) [phone] ________________________________  Name: Romonica Blacknall MRN: [id number]  Date: 05/17/2019  DOB: [birth year]  Simulation Verification Note    ICD-10-CM   1. 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 breast in female, estrogen receptor positive ([place])  C50.211    Z17.0    NARRATIVE: The patient was brought to the treatment unit and placed in the planned treatment position. The clinical setup was verified. Then port films were obtained and uploaded to the radiation oncology medical record software.  The treatment beams were carefully compared against the planned radiation fields. The position location and shape of the radiation fields was reviewed. They targeted volume of tissue appears to be appropriately covered by the radiation beams. Organs at risk appear to be excluded as planned.  Based on my personal review, I approved the simulation verification. The patient's treatment will proceed as planned.  -----------------------------------  Blair Promise, PhD, MD  This document serves as a record of services personally performed by Gery Pray, MD. It was created on his behalf by Clerance Lav, a trained medical scribe. The creation of this record is based on the scribe's personal observations and the provider's statements to them. This document has been checked and approved by the attending provider.

## 2019-05-16 ENCOUNTER — Ambulatory Visit (INDEPENDENT_AMBULATORY_CARE_PROVIDER_SITE_OTHER): Payer: Medicare Other | Admitting: Psychology

## 2019-05-16 DIAGNOSIS — F064 Anxiety disorder due to known physiological condition: Secondary | ICD-10-CM | POA: Diagnosis not present

## 2019-05-17 ENCOUNTER — Ambulatory Visit
Admission: RE | Admit: 2019-05-17 | Discharge: 2019-05-17 | Disposition: A | Discharge status: Home / Self Care | Payer: Medicare Other | Source: Ambulatory Visit | Attending: Radiation Oncology | Admitting: Radiation Oncology

## 2019-05-17 ENCOUNTER — Other Ambulatory Visit: Payer: Self-pay

## 2019-05-17 DIAGNOSIS — C50211 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 female breast: Secondary | ICD-10-CM | POA: Diagnosis not present

## 2019-05-17 DIAGNOSIS — Z170 Estrogen receptor positive status [ER+]: Secondary | ICD-10-CM

## 2019-05-17 DIAGNOSIS — Z510 Encounter for antineoplastic radiation therapy: Secondary | ICD-10-CM | POA: Diagnosis not present

## 2019-05-18 ENCOUNTER — Ambulatory Visit
Admission: RE | Admit: 2019-05-18 | Discharge: 2019-05-18 | Disposition: A | Discharge status: Home / Self Care | Payer: Medicare Other | Source: Ambulatory Visit | Attending: Radiation Oncology | Admitting: Radiation Oncology

## 2019-05-18 ENCOUNTER — Telehealth: Payer: Self-pay | Admitting: Hematology and Oncology

## 2019-05-18 ENCOUNTER — Other Ambulatory Visit: Payer: Self-pay

## 2019-05-18 DIAGNOSIS — C50211 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 female breast: Secondary | ICD-10-CM | POA: Diagnosis not present

## 2019-05-18 DIAGNOSIS — Z510 Encounter for antineoplastic radiation therapy: Secondary | ICD-10-CM | POA: Diagnosis not present

## 2019-05-18 NOTE — Telephone Encounter (Signed)
R/s appt per 1/25 sch message - mailed letter with appt date and time

## 2019-05-19 ENCOUNTER — Other Ambulatory Visit: Payer: Self-pay

## 2019-05-19 ENCOUNTER — Ambulatory Visit
Admission: RE | Admit: 2019-05-19 | Discharge: 2019-05-19 | Disposition: A | Discharge status: Home / Self Care | Payer: Medicare Other | Source: Ambulatory Visit | Attending: Radiation Oncology | Admitting: Radiation Oncology

## 2019-05-19 DIAGNOSIS — Z510 Encounter for antineoplastic radiation therapy: Secondary | ICD-10-CM | POA: Diagnosis not present

## 2019-05-19 DIAGNOSIS — C50211 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 female breast: Secondary | ICD-10-CM | POA: Diagnosis not present

## 2019-05-22 ENCOUNTER — Ambulatory Visit
Admission: RE | Admit: 2019-05-22 | Discharge: 2019-05-22 | Disposition: A | Discharge status: Home / Self Care | Payer: Medicare Other | Source: Ambulatory Visit | Attending: Radiation Oncology | Admitting: Radiation Oncology

## 2019-05-22 ENCOUNTER — Other Ambulatory Visit: Payer: Self-pay

## 2019-05-22 DIAGNOSIS — C50211 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 female breast: Secondary | ICD-10-CM | POA: Diagnosis not present

## 2019-05-22 DIAGNOSIS — Z510 Encounter for antineoplastic radiation therapy: Secondary | ICD-10-CM | POA: Diagnosis not present

## 2019-05-22 DIAGNOSIS — Z170 Estrogen receptor positive status [ER+]: Secondary | ICD-10-CM | POA: Diagnosis not present

## 2019-05-23 ENCOUNTER — Ambulatory Visit
Admission: RE | Admit: 2019-05-23 | Discharge: 2019-05-23 | Disposition: A | Discharge status: Home / Self Care | Payer: Medicare Other | Source: Ambulatory Visit | Attending: Radiation Oncology | Admitting: Radiation Oncology

## 2019-05-23 ENCOUNTER — Other Ambulatory Visit: Payer: Self-pay

## 2019-05-23 DIAGNOSIS — Z510 Encounter for antineoplastic radiation therapy: Secondary | ICD-10-CM | POA: Diagnosis not present

## 2019-05-23 DIAGNOSIS — Z170 Estrogen receptor positive status [ER+]: Secondary | ICD-10-CM | POA: Diagnosis not present

## 2019-05-23 DIAGNOSIS — C50211 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 female breast: Secondary | ICD-10-CM | POA: Diagnosis not present

## 2019-05-24 ENCOUNTER — Ambulatory Visit
Admission: RE | Admit: 2019-05-24 | Discharge: 2019-05-24 | Disposition: A | Discharge status: Home / Self Care | Payer: Medicare Other | Source: Ambulatory Visit | Attending: Radiation Oncology | Admitting: Radiation Oncology

## 2019-05-24 ENCOUNTER — Other Ambulatory Visit: Payer: Self-pay

## 2019-05-24 DIAGNOSIS — C50211 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 female breast: Secondary | ICD-10-CM | POA: Diagnosis not present

## 2019-05-24 DIAGNOSIS — Z510 Encounter for antineoplastic radiation therapy: Secondary | ICD-10-CM | POA: Diagnosis not present

## 2019-05-24 DIAGNOSIS — Z170 Estrogen receptor positive status [ER+]: Secondary | ICD-10-CM | POA: Diagnosis not present

## 2019-05-25 ENCOUNTER — Other Ambulatory Visit: Payer: Self-pay

## 2019-05-25 ENCOUNTER — Ambulatory Visit
Admission: RE | Admit: 2019-05-25 | Discharge: 2019-05-25 | Disposition: A | Discharge status: Home / Self Care | Payer: Medicare Other | Source: Ambulatory Visit | Attending: Radiation Oncology | Admitting: Radiation Oncology

## 2019-05-25 DIAGNOSIS — Z510 Encounter for antineoplastic radiation therapy: Secondary | ICD-10-CM | POA: Diagnosis not present

## 2019-05-25 DIAGNOSIS — Z170 Estrogen receptor positive status [ER+]: Secondary | ICD-10-CM | POA: Diagnosis not present

## 2019-05-25 DIAGNOSIS — C50211 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 female breast: Secondary | ICD-10-CM | POA: Diagnosis not present

## 2019-05-25 NOTE — Progress Notes (Signed)
Patient Care Team: [name], MD as PCP - General (Family Medicine) [name], RN as Oncology Nurse Navigator Mauro Kaufmann, RN as Oncology Nurse Navigator  DIAGNOSIS:    ICD-10-CM   1. 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 breast in female, estrogen receptor positive ([place])  C50.211 MM DIAG BREAST TOMO UNI LEFT   Z17.0     SUMMARY OF ONCOLOGIC HISTORY: Oncology History  Breast cancer of upper-inner quadrant of right female breast ([place])  12/05/2015 Mammogram   Right breast mass 2:00 4 cm from nipple: 1.7 cm, T1c N0 stage IA; right breast 2:00 6 cm from nipple 1.3 cm mass, 7 mm satellite nodule between the 2 masses, T1 cN0 stage IA   12/06/2015 Initial Diagnosis   Right breast biopsy 2:00 6 cm from nipple: Grade 2-3 invasive ductal cancer, ER 90%, PR 2%, Ki-67 80%, HER-2 positive ratio 2.07; right biopsy 2:00 4 cm from nipple: IDC with DCIS, ER 95%, PR 10%, Ki-67 90%, HER-2 negative ratio 1.27   12/18/2015 Breast MRI   3 enhancing masses in the upper inner quadrant right breast spanning an area 3.7 cm (1.7 cm, 1 cm, 1.4 cm) no lymph node enlargement   12/31/2015 - 04/15/2016 Neo-Adjuvant Chemotherapy   [facility] Perjeta 6 cycles followed by Herceptin, Perjeta maintenance for 1 year completed 10/20/2016   01/08/2016 - 01/12/2016 Hospital Admission   Neutropenic fever hospitalization (because patient did not receive Neulasta with cycle 1)   01/10/2016 Miscellaneous   Genetic testing was normal did not reveal any mutations   04/16/2016 Breast MRI   Near CR to therapy. Previous 2 biopsied massses are no longer seen. Third satellite lesion is smaller from 10 mm to 5.3 mm, no abnormal LN.   05/19/2016 Surgery   Right mastectomy: IDC grade 3, 1.3 cm, low-grade DCIS, LVIDS present, margins negative, 0/1 lymph node negative, ypT1CypN0 stage IA; repeat HER-2 negative ratio 1.41   05/19/2016 Surgery   Right breast reconstruction with tissue expander. Acellular dermis for  breast reconstruction (Dr.Thimappa)    06/22/2016 -  Anti-estrogen oral therapy   Letrozole 2.5 mg daily   11/01/2018 Relapse/Recurrence   Patient palpated lump in the UIQ at surgical scar of reconstructed right breast. US of the right breast showed a 1.8cm mass suspicious for recurrent breast cancer. Biopsy showed grade 2 invasive ductal carcinoma, HER-2 negative (1+), ER 90%, PR negative, Ki67 40%.   11/11/2018 Oncotype testing   Oncotype score 52: Greater than 39% risk of distant recurrence with hormone therapy alone, chemo benefit greater than 15%   11/30/2018 Surgery   Right lumpectomy [name]): IDC, grade 3, 2.1cm, perineural invasion present, carcinoma present focally at the posterior margin and involving skeletal muscle.   11/30/2018 Cancer Staging   Staging form: Breast, AJCC 8th Edition - Pathologic stage from 11/30/2018: Stage IIA (pT2, pN0, cM0, G3, ER+, PR-, HER2-) - Signed by Gardenia Phlegm, NP on 12/14/2018   12/23/2018 -  Chemotherapy   The patient had palonosetron (ALOXI) injection 0.25 mg, 0.25 mg, Intravenous,  Once, 9 of 9 cycles Administration: 0.25 mg (12/23/2018), 0.25 mg (01/13/2019), 0.25 mg (02/03/2019), 0.25 mg (02/24/2019), 0.25 mg (03/17/2019), 0.25 mg (04/07/2019) pegfilgrastim (NEULASTA ONPRO KIT) injection 6 mg, 6 mg, Subcutaneous, Once, 9 of 9 cycles Administration: 6 mg (12/23/2018), 6 mg (01/13/2019), 6 mg (02/03/2019), 6 mg (02/24/2019), 6 mg (03/17/2019), 6 mg (04/07/2019) cyclophosphamide (CYTOXAN) 1,060 mg in sodium chloride 0.9 % 250 mL chemo infusion, 600 mg/m2 = 1,060 mg, Intravenous,  Once, 9 of 9 cycles Administration: 1,060 mg (12/23/2018), 1,060 mg (01/13/2019), 1,060 mg (02/03/2019), 1,060 mg (02/24/2019), 1,020 mg (03/17/2019), 1,020 mg (04/07/2019) DOCEtaxel (TAXOTERE) 130 mg in sodium chloride 0.9 % 250 mL chemo infusion, 75 mg/m2 = 130 mg, Intravenous,  Once, 9 of 9 cycles Administration: 130 mg (12/23/2018), 130 mg (01/13/2019), 130 mg (02/03/2019),  130 mg (02/24/2019), 130 mg (03/17/2019), 130 mg (04/07/2019) fosaprepitant (EMEND) 150 mg in sodium chloride 0.9 % 145 mL IVPB, , Intravenous,  Once, 9 of 9 cycles Administration:  (12/23/2018),  (02/03/2019),  (01/13/2019),  (02/24/2019),  (03/17/2019),  (04/07/2019)  for chemotherapy treatment.    05/18/2019 -  Radiation Therapy   Adjuvant radiation     CHIEF COMPLIANT: Follow-up of right breast cancer to discuss antiestrogen therapy  INTERVAL HISTORY: Patricia Chandler is a 64 y.o. with above-mentioned history of recurrent right breast cancerwho underwent a lumpectomy, adjuvant chemotherapy with Taxotere and Cytoxan, and is currently on radiation therapy.She presents to the clinic todayto discuss antiestrogen therapy.  She is currently undergoing adjuvant radiation and appears to be tolerating it fairly well.  She is anxious to get started on antiestrogen therapy even before she finishes radiation.  She also wants to be as aggressive as possible and was discussing about additional treatment options.  ALLERGIES:  has No Known Allergies.  MEDICATIONS:  Current Outpatient Medications  Medication Sig Dispense Refill  . anastrozole (ARIMIDEX) 1 MG tablet Take 1 tablet (1 mg total) by mouth daily. 90 tablet 3  . Biotin 1 MG CAPS Take by mouth.    . calcium-vitamin D (OSCAL WITH D) 500-200 MG-UNIT tablet Take 1 tablet by mouth daily with breakfast.    . denosumab (PROLIA) 60 MG/ML SOLN injection Inject 60 mg into the skin every 6 (six) months. Administer in upper arm, thigh, or abdomen    . Lactobacillus (PROBIOTIC ACIDOPHILUS PO) Take 1 capsule by mouth daily.     Marland Kitchen LORazepam (ATIVAN) 0.5 MG tablet Take 1 tablet (0.5 mg total) by mouth at bedtime as needed for sleep. 30 tablet 3  . omeprazole (PRILOSEC) 20 MG capsule Take 20 mg by mouth daily as needed (heartburn).    . zolpidem (AMBIEN) 5 MG tablet Take 1 tablet (5 mg total) by mouth at bedtime. 30 tablet 0   No current  facility-administered medications for this visit.    PHYSICAL EXAMINATION: ECOG PERFORMANCE STATUS: 1 - Symptomatic but completely ambulatory  Vitals:   05/26/19 0902  BP: (!) 118/98  Pulse: (!) 111  Resp: 17  Temp: 97.9 F (36.6 C)  SpO2: 100%   Filed Weights   05/26/19 0902  Weight: 137 lb 14.4 oz (62.6 kg)    LABORATORY DATA:  I have reviewed the data as listed CMP Latest Ref Rng & Units 04/07/2019 03/17/2019 02/24/2019  Glucose 70 - 99 mg/dL 85 96 83  BUN 8 - 23 mg/dL '11 12 18  '$ Creatinine 0.44 - 1.00 mg/dL 0.68 0.75 0.72  Sodium 135 - 145 mmol/L 140 141 138  Potassium 3.5 - 5.1 mmol/L 3.8 4.0 3.8  Chloride 98 - 111 mmol/L 107 107 106  CO2 22 - 32 mmol/L '25 24 22  '$ Calcium 8.9 - 10.3 mg/dL 8.6(L) 9.0 9.3  Total Protein 6.5 - 8.1 g/dL 5.6(L) 5.8(L) 6.1(L)  Total Bilirubin 0.3 - 1.2 mg/dL 0.4 0.3 0.3  Alkaline Phos 38 - 126 U/L 51 63 60  AST 15 - 41 U/L '18 19 18  '$ ALT 0 - 44 U/L 13 17  18    Lab Results  Component Value Date   WBC 5.4 04/07/2019   HGB 9.5 (L) 04/07/2019   HCT 29.0 (L) 04/07/2019   MCV 95.7 04/07/2019   PLT 289 04/07/2019   NEUTROABS 4.0 04/07/2019    ASSESSMENT & PLAN:  Breast cancer of upper-inner quadrant of right female breast ([place]) Right breast biopsy 2:00 6 cm from nipple: Grade 2-3 invasive ductal cancer, ER 90%, PR 2%, Ki-67 80%, HER-2 positive ratio 2.07; right biopsy 2:00 4 cm from nipple: IDC with DCIS, ER 95%, PR 10%, Ki-67 90%, HER-2 negative ratio 1.27 Breast MRI08/30/2017: 3 enhancing masses in the upper inner quadrant right breast spanning an area 3.7 cm (1.7 cm, 1 cm, 1.4 cm) no lymph node enlargement Clinical stage: T2N0 (stage 2A)  Prior treatment summary: 1. Genetic counseling: Normal did not reveal any abnormal mutations 2. Neoadjuvant chemotherapy. [facility] Perjeta 6 cycles followed by Herceptin And Perjetamaintenance completed 10/20/2016) from 12/31/15 to 04/15/16 3. Right mastectomy with reconstruction: 05/19/2016:Right  mastectomy: IDC grade 3, 1.3 cm, low-grade DCIS, LVIDS present, margins negative, 0/1 lymph node negative, ypT1CypN0 stage IA; repeat HER-2 negative ratio 1.41 4. Adjuvant antiestrogen therapy With letrozole 2.5 mg daily started March 2018 -------------------------------------------------------------------------------------------------------------------------------------------------- Relapsed breast cancer: Subcutaneous disease: 1.8 cm measured by ultrasound Biopsy revealed grade 2-3 IDC ER 90% PR 0% HER-2 negative Ki-67 40% 11/30/2018 right lumpectomy [name]): IDC, grade 3, 2.1cm, perineural invasion present, carcinoma present focally at the posterior margin and involving skeletal muscle. Oncotype score 52: Greater than 39% risk of distant recurrence with hormone therapy alone, chemo benefit greater than 15%  Recommendation: 1.Surgery to remove the tumor8/03/2019 2.Adjuvant chemotherapy withTaxotere and Cytoxan x6 cycles 12/30/2018 -04/07/2019 3.Adjuvant radiation started 05/19/2019 4.Follow-up adjuvant antiestrogen therapy ------------------------------------------------------------------------------------------------------------------------------------ Jugular vein DVT: On anticoagulation Chemotherapy-induced anemia: We did not perform labs today.    Patient is anxious to get started on adjuvant antiestrogen therapy.  I discussed starting anastrozole today. We discussed participation in antiestrogen therapy compliance study.  She is interested in participating in it.  We discussed the clinical trial [facility] which revealed that abemaciclib added to aromatase inhibitor was superior in the high risk adjuvant setting. Abemaciclib counseling: I discussed at length the risks and benefits of Abemaciclib in combination with letrozole. Adverse effects of Abemaciclib include decreasing neutrophil count, pneumonia, blood clots in lungs as well as nausea and GI symptoms. Side effects of  letrozole include hot flashes, muscle aches and pains, uterine bleeding/spotting/cancer, osteoporosis, risk of blood clots.  We will start abemaciclib after radiation is complete. Return to clinic at the end of radiation.    Orders Placed This Encounter  Procedures  . MM DIAG BREAST TOMO UNI LEFT    Standing Status:   Future    Standing Expiration Date:   05/25/2020    Order Specific Question:   Reason for Exam (SYMPTOM  OR DIAGNOSIS REQUIRED)    Answer:   annual mammogram with H/O Right breast cancer    Order Specific Question:   Preferred imaging location?    Answer:   [facility]   The patient has a good understanding of the overall plan. she agrees with it. she will call with any problems that may develop before the next visit here.  Total time spent: 30 mins including face to face time and time spent for planning, charting and coordination of care  Nicholas Lose, MD 05/26/2019  I, Cloyde Reams Dorshimer, am acting as scribe for Dr. Nicholas Lose.  I have reviewed the above documentation  for accuracy and completeness, and I agree with the above.

## 2019-05-26 ENCOUNTER — Inpatient Hospital Stay: Payer: Medicare Other | Attending: Hematology and Oncology | Admitting: Hematology and Oncology

## 2019-05-26 ENCOUNTER — Ambulatory Visit
Admission: RE | Admit: 2019-05-26 | Discharge: 2019-05-26 | Disposition: A | Discharge status: Home / Self Care | Payer: Medicare Other | Source: Ambulatory Visit | Attending: Radiation Oncology | Admitting: Radiation Oncology

## 2019-05-26 ENCOUNTER — Encounter: Payer: Self-pay | Admitting: Radiation Oncology

## 2019-05-26 ENCOUNTER — Other Ambulatory Visit: Payer: Self-pay

## 2019-05-26 ENCOUNTER — Encounter (INDEPENDENT_AMBULATORY_CARE_PROVIDER_SITE_OTHER): Payer: Self-pay

## 2019-05-26 DIAGNOSIS — C50211 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 female breast: Secondary | ICD-10-CM | POA: Diagnosis not present

## 2019-05-26 DIAGNOSIS — Z170 Estrogen receptor positive status [ER+]: Secondary | ICD-10-CM | POA: Diagnosis not present

## 2019-05-26 DIAGNOSIS — D6481 Anemia due to antineoplastic chemotherapy: Secondary | ICD-10-CM | POA: Diagnosis not present

## 2019-05-26 DIAGNOSIS — Z510 Encounter for antineoplastic radiation therapy: Secondary | ICD-10-CM | POA: Diagnosis not present

## 2019-05-26 DIAGNOSIS — I82C19 Acute embolism and thrombosis of unspecified internal jugular vein: Secondary | ICD-10-CM | POA: Diagnosis not present

## 2019-05-26 MED ORDER — ANASTROZOLE 1 MG PO TABS: 1.0000 mg | ORAL_TABLET | Freq: Every day | ORAL | 3 refills | Status: DC

## 2019-05-26 NOTE — Assessment & Plan Note (Signed)
Right breast biopsy 2:00 6 cm from nipple: Grade 2-3 invasive ductal cancer, ER 90%, PR 2%, Ki-67 80%, HER-2 positive ratio 2.07; right biopsy 2:00 4 cm from nipple: IDC with DCIS, ER 95%, PR 10%, Ki-67 90%, HER-2 negative ratio 1.27 Breast MRI08/30/2017: 3 enhancing masses in the upper inner quadrant right breast spanning an area 3.7 cm (1.7 cm, 1 cm, 1.4 cm) no lymph node enlargement Clinical stage: T2N0 (stage 2A)  Prior treatment summary: 1. Genetic counseling: Normal did not reveal any abnormal mutations 2. Neoadjuvant chemotherapy. [facility] Perjeta 6 cycles followed by Herceptin And Perjetamaintenance completed 10/20/2016) from 12/31/15 to 04/15/16 3. Right mastectomy with reconstruction: 05/19/2016:Right mastectomy: IDC grade 3, 1.3 cm, low-grade DCIS, LVIDS present, margins negative, 0/1 lymph node negative, ypT1CypN0 stage IA; repeat HER-2 negative ratio 1.41 4. Adjuvant antiestrogen therapy With letrozole 2.5 mg daily started March 2018 -------------------------------------------------------------------------------------------------------------------------------------------------- Relapsed breast cancer: Subcutaneous disease: 1.8 cm measured by ultrasound Biopsy revealed grade 2-3 IDC ER 90% PR 0% HER-2 negative Ki-67 40% 11/30/2018 right lumpectomy [name]): IDC, grade 3, 2.1cm, perineural invasion present, carcinoma present focally at the posterior margin and involving skeletal muscle. Oncotype score 52: Greater than 39% risk of distant recurrence with hormone therapy alone, chemo benefit greater than 15%  Recommendation: 1.Surgery to remove the tumor8/03/2019 2.Adjuvant chemotherapy withTaxotere and Cytoxan x6 cycles 12/30/2018 -04/07/2019 3.Adjuvant radiation started 05/19/2019 4.Follow-up adjuvant antiestrogen therapy ------------------------------------------------------------------------------------------------------------------------------------ Jugular vein DVT: On  anticoagulation Chemotherapy-induced anemia: Shortness of breath with exertion: Due to deconditioning  Once radiation is complete I recommended continuation of antiestrogen therapy. We discussed participation in antiestrogen therapy compliance study.  Return to clinic in 3 months for survivorship care plan visit.

## 2019-05-26 NOTE — Progress Notes (Signed)
I received word from Luberta Mutter that the patient wished to change physicians in radiation oncology and will be paired with me.  I was told that the patient wished to speak with me by phone before meeting me next week in clinic and that I should call her cell phone today.  I just left a voicemail message for her with information on how to get back in touch with me by phone.  Otherwise, I look forward her to meeting her in our clinic on Monday as she continues her breast cancer radiotherapy.  -----------------------------------  Eppie Gibson, MD

## 2019-05-29 ENCOUNTER — Ambulatory Visit
Admission: RE | Admit: 2019-05-29 | Discharge: 2019-05-29 | Disposition: A | Discharge status: Home / Self Care | Payer: Medicare Other | Source: Ambulatory Visit | Attending: Radiation Oncology | Admitting: Radiation Oncology

## 2019-05-29 ENCOUNTER — Other Ambulatory Visit: Payer: Self-pay

## 2019-05-29 DIAGNOSIS — Z170 Estrogen receptor positive status [ER+]: Secondary | ICD-10-CM | POA: Diagnosis not present

## 2019-05-29 DIAGNOSIS — C50211 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 female breast: Secondary | ICD-10-CM | POA: Diagnosis not present

## 2019-05-29 DIAGNOSIS — Z510 Encounter for antineoplastic radiation therapy: Secondary | ICD-10-CM | POA: Diagnosis not present

## 2019-05-30 ENCOUNTER — Other Ambulatory Visit: Payer: Self-pay

## 2019-05-30 ENCOUNTER — Ambulatory Visit: Payer: Medicare Other | Admitting: Psychology

## 2019-05-30 ENCOUNTER — Other Ambulatory Visit: Payer: Self-pay | Admitting: Hematology and Oncology

## 2019-05-30 ENCOUNTER — Ambulatory Visit
Admission: RE | Admit: 2019-05-30 | Discharge: 2019-05-30 | Disposition: A | Discharge status: Home / Self Care | Payer: Medicare Other | Source: Ambulatory Visit | Attending: Radiation Oncology | Admitting: Radiation Oncology

## 2019-05-30 DIAGNOSIS — Z510 Encounter for antineoplastic radiation therapy: Secondary | ICD-10-CM | POA: Diagnosis not present

## 2019-05-30 DIAGNOSIS — Z170 Estrogen receptor positive status [ER+]: Secondary | ICD-10-CM | POA: Diagnosis not present

## 2019-05-30 DIAGNOSIS — C50211 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 female breast: Secondary | ICD-10-CM | POA: Diagnosis not present

## 2019-05-31 ENCOUNTER — Other Ambulatory Visit: Payer: Self-pay

## 2019-05-31 ENCOUNTER — Ambulatory Visit
Admission: RE | Admit: 2019-05-31 | Discharge: 2019-05-31 | Disposition: A | Discharge status: Home / Self Care | Payer: Medicare Other | Source: Ambulatory Visit | Attending: Radiation Oncology | Admitting: Radiation Oncology

## 2019-05-31 DIAGNOSIS — Z170 Estrogen receptor positive status [ER+]: Secondary | ICD-10-CM | POA: Diagnosis not present

## 2019-05-31 DIAGNOSIS — Z510 Encounter for antineoplastic radiation therapy: Secondary | ICD-10-CM | POA: Diagnosis not present

## 2019-05-31 DIAGNOSIS — C50211 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 female breast: Secondary | ICD-10-CM | POA: Diagnosis not present

## 2019-06-01 ENCOUNTER — Other Ambulatory Visit: Payer: Self-pay

## 2019-06-01 ENCOUNTER — Ambulatory Visit
Admission: RE | Admit: 2019-06-01 | Discharge: 2019-06-01 | Disposition: A | Discharge status: Home / Self Care | Payer: Medicare Other | Source: Ambulatory Visit | Attending: Radiation Oncology | Admitting: Radiation Oncology

## 2019-06-01 ENCOUNTER — Ambulatory Visit: Payer: Self-pay

## 2019-06-01 DIAGNOSIS — Z170 Estrogen receptor positive status [ER+]: Secondary | ICD-10-CM | POA: Diagnosis not present

## 2019-06-01 DIAGNOSIS — Z510 Encounter for antineoplastic radiation therapy: Secondary | ICD-10-CM | POA: Diagnosis not present

## 2019-06-01 DIAGNOSIS — C50211 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 female breast: Secondary | ICD-10-CM | POA: Diagnosis not present

## 2019-06-02 ENCOUNTER — Encounter (INDEPENDENT_AMBULATORY_CARE_PROVIDER_SITE_OTHER): Payer: Self-pay

## 2019-06-02 ENCOUNTER — Ambulatory Visit (INDEPENDENT_AMBULATORY_CARE_PROVIDER_SITE_OTHER): Payer: Medicare Other | Admitting: Psychology

## 2019-06-02 ENCOUNTER — Other Ambulatory Visit: Payer: Self-pay

## 2019-06-02 ENCOUNTER — Ambulatory Visit
Admission: RE | Admit: 2019-06-02 | Discharge: 2019-06-02 | Disposition: A | Discharge status: Home / Self Care | Payer: Medicare Other | Source: Ambulatory Visit | Attending: Radiation Oncology | Admitting: Radiation Oncology

## 2019-06-02 DIAGNOSIS — Z510 Encounter for antineoplastic radiation therapy: Secondary | ICD-10-CM | POA: Diagnosis not present

## 2019-06-02 DIAGNOSIS — F064 Anxiety disorder due to known physiological condition: Secondary | ICD-10-CM

## 2019-06-02 DIAGNOSIS — C50211 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 female breast: Secondary | ICD-10-CM | POA: Diagnosis not present

## 2019-06-02 DIAGNOSIS — Z170 Estrogen receptor positive status [ER+]: Secondary | ICD-10-CM | POA: Diagnosis not present

## 2019-06-04 ENCOUNTER — Ambulatory Visit: Payer: Self-pay

## 2019-06-05 ENCOUNTER — Other Ambulatory Visit: Payer: Self-pay

## 2019-06-05 ENCOUNTER — Encounter: Payer: Self-pay | Admitting: *Deleted

## 2019-06-05 ENCOUNTER — Ambulatory Visit
Admission: RE | Admit: 2019-06-05 | Discharge: 2019-06-05 | Disposition: A | Discharge status: Home / Self Care | Payer: Medicare Other | Source: Ambulatory Visit | Attending: Radiation Oncology | Admitting: Radiation Oncology

## 2019-06-05 DIAGNOSIS — Z510 Encounter for antineoplastic radiation therapy: Secondary | ICD-10-CM | POA: Diagnosis not present

## 2019-06-05 DIAGNOSIS — C50211 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 female breast: Secondary | ICD-10-CM | POA: Diagnosis not present

## 2019-06-05 DIAGNOSIS — Z170 Estrogen receptor positive status [ER+]: Secondary | ICD-10-CM | POA: Diagnosis not present

## 2019-06-06 ENCOUNTER — Ambulatory Visit
Admission: RE | Admit: 2019-06-06 | Discharge: 2019-06-06 | Disposition: A | Discharge status: Home / Self Care | Payer: Medicare Other | Source: Ambulatory Visit | Attending: Radiation Oncology | Admitting: Radiation Oncology

## 2019-06-06 ENCOUNTER — Other Ambulatory Visit: Payer: Self-pay

## 2019-06-06 DIAGNOSIS — Z510 Encounter for antineoplastic radiation therapy: Secondary | ICD-10-CM | POA: Diagnosis not present

## 2019-06-06 DIAGNOSIS — C50211 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 female breast: Secondary | ICD-10-CM | POA: Diagnosis not present

## 2019-06-06 DIAGNOSIS — Z170 Estrogen receptor positive status [ER+]: Secondary | ICD-10-CM | POA: Diagnosis not present

## 2019-06-07 ENCOUNTER — Ambulatory Visit
Admission: RE | Admit: 2019-06-07 | Discharge: 2019-06-07 | Disposition: A | Discharge status: Home / Self Care | Payer: Medicare Other | Source: Ambulatory Visit | Attending: Radiation Oncology | Admitting: Radiation Oncology

## 2019-06-07 ENCOUNTER — Other Ambulatory Visit: Payer: Self-pay

## 2019-06-07 DIAGNOSIS — C50211 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 female breast: Secondary | ICD-10-CM | POA: Diagnosis not present

## 2019-06-07 DIAGNOSIS — Z510 Encounter for antineoplastic radiation therapy: Secondary | ICD-10-CM | POA: Diagnosis not present

## 2019-06-07 DIAGNOSIS — Z170 Estrogen receptor positive status [ER+]: Secondary | ICD-10-CM | POA: Diagnosis not present

## 2019-06-08 ENCOUNTER — Other Ambulatory Visit: Payer: Self-pay

## 2019-06-08 ENCOUNTER — Ambulatory Visit
Admission: RE | Admit: 2019-06-08 | Discharge: 2019-06-08 | Disposition: A | Discharge status: Home / Self Care | Payer: Medicare Other | Source: Ambulatory Visit | Attending: Radiation Oncology | Admitting: Radiation Oncology

## 2019-06-08 DIAGNOSIS — Z170 Estrogen receptor positive status [ER+]: Secondary | ICD-10-CM | POA: Diagnosis not present

## 2019-06-08 DIAGNOSIS — C50211 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 female breast: Secondary | ICD-10-CM | POA: Diagnosis not present

## 2019-06-08 DIAGNOSIS — Z510 Encounter for antineoplastic radiation therapy: Secondary | ICD-10-CM | POA: Diagnosis not present

## 2019-06-09 ENCOUNTER — Ambulatory Visit
Admission: RE | Admit: 2019-06-09 | Discharge: 2019-06-09 | Disposition: A | Discharge status: Home / Self Care | Payer: Medicare Other | Source: Ambulatory Visit | Attending: Radiation Oncology | Admitting: Radiation Oncology

## 2019-06-09 ENCOUNTER — Encounter (INDEPENDENT_AMBULATORY_CARE_PROVIDER_SITE_OTHER): Payer: Self-pay

## 2019-06-09 ENCOUNTER — Other Ambulatory Visit: Payer: Self-pay

## 2019-06-09 DIAGNOSIS — C50211 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 female breast: Secondary | ICD-10-CM | POA: Diagnosis not present

## 2019-06-09 DIAGNOSIS — Z510 Encounter for antineoplastic radiation therapy: Secondary | ICD-10-CM | POA: Diagnosis not present

## 2019-06-09 DIAGNOSIS — Z170 Estrogen receptor positive status [ER+]: Secondary | ICD-10-CM | POA: Diagnosis not present

## 2019-06-12 ENCOUNTER — Ambulatory Visit: Payer: Medicare Other | Admitting: Radiation Oncology

## 2019-06-12 ENCOUNTER — Ambulatory Visit
Admission: RE | Admit: 2019-06-12 | Discharge: 2019-06-12 | Disposition: A | Discharge status: Home / Self Care | Payer: Medicare Other | Source: Ambulatory Visit | Attending: Radiation Oncology | Admitting: Radiation Oncology

## 2019-06-12 ENCOUNTER — Other Ambulatory Visit: Payer: Self-pay

## 2019-06-12 DIAGNOSIS — C50211 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 female breast: Secondary | ICD-10-CM

## 2019-06-12 DIAGNOSIS — Z170 Estrogen receptor positive status [ER+]: Secondary | ICD-10-CM

## 2019-06-12 DIAGNOSIS — Z510 Encounter for antineoplastic radiation therapy: Secondary | ICD-10-CM | POA: Diagnosis not present

## 2019-06-12 MED ORDER — SONAFINE EX EMUL
1.0000 "application " | Freq: Once | CUTANEOUS | Status: AC
  Administered 2019-06-12: 1 via TOPICAL

## 2019-06-13 ENCOUNTER — Ambulatory Visit
Admission: RE | Admit: 2019-06-13 | Discharge: 2019-06-13 | Disposition: A | Discharge status: Home / Self Care | Payer: Medicare Other | Source: Ambulatory Visit | Attending: Radiation Oncology | Admitting: Radiation Oncology

## 2019-06-13 ENCOUNTER — Ambulatory Visit: Payer: Medicare Other | Admitting: Radiation Oncology

## 2019-06-13 ENCOUNTER — Other Ambulatory Visit: Payer: Self-pay

## 2019-06-13 DIAGNOSIS — C50211 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 female breast: Secondary | ICD-10-CM | POA: Diagnosis not present

## 2019-06-13 DIAGNOSIS — Z170 Estrogen receptor positive status [ER+]: Secondary | ICD-10-CM | POA: Diagnosis not present

## 2019-06-13 DIAGNOSIS — Z510 Encounter for antineoplastic radiation therapy: Secondary | ICD-10-CM | POA: Diagnosis not present

## 2019-06-14 ENCOUNTER — Ambulatory Visit
Admission: RE | Admit: 2019-06-14 | Discharge: 2019-06-14 | Disposition: A | Discharge status: Home / Self Care | Payer: Medicare Other | Source: Ambulatory Visit | Attending: Radiation Oncology | Admitting: Radiation Oncology

## 2019-06-14 ENCOUNTER — Other Ambulatory Visit: Payer: Self-pay

## 2019-06-14 DIAGNOSIS — Z510 Encounter for antineoplastic radiation therapy: Secondary | ICD-10-CM | POA: Diagnosis not present

## 2019-06-14 DIAGNOSIS — C50211 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 female breast: Secondary | ICD-10-CM | POA: Diagnosis not present

## 2019-06-14 DIAGNOSIS — Z170 Estrogen receptor positive status [ER+]: Secondary | ICD-10-CM | POA: Diagnosis not present

## 2019-06-15 ENCOUNTER — Ambulatory Visit
Admission: RE | Admit: 2019-06-15 | Discharge: 2019-06-15 | Disposition: A | Discharge status: Home / Self Care | Payer: Medicare Other | Source: Ambulatory Visit | Attending: Radiation Oncology | Admitting: Radiation Oncology

## 2019-06-15 ENCOUNTER — Other Ambulatory Visit: Payer: Self-pay

## 2019-06-15 DIAGNOSIS — Z510 Encounter for antineoplastic radiation therapy: Secondary | ICD-10-CM | POA: Diagnosis not present

## 2019-06-15 DIAGNOSIS — Z170 Estrogen receptor positive status [ER+]: Secondary | ICD-10-CM | POA: Diagnosis not present

## 2019-06-15 DIAGNOSIS — C50211 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 female breast: Secondary | ICD-10-CM | POA: Diagnosis not present

## 2019-06-16 ENCOUNTER — Encounter (INDEPENDENT_AMBULATORY_CARE_PROVIDER_SITE_OTHER): Payer: Self-pay

## 2019-06-16 ENCOUNTER — Ambulatory Visit
Admission: RE | Admit: 2019-06-16 | Discharge: 2019-06-16 | Disposition: A | Discharge status: Home / Self Care | Payer: Medicare Other | Source: Ambulatory Visit | Attending: Radiation Oncology | Admitting: Radiation Oncology

## 2019-06-16 ENCOUNTER — Other Ambulatory Visit: Payer: Self-pay

## 2019-06-16 ENCOUNTER — Ambulatory Visit (INDEPENDENT_AMBULATORY_CARE_PROVIDER_SITE_OTHER): Payer: Medicare Other | Admitting: Psychology

## 2019-06-16 DIAGNOSIS — F064 Anxiety disorder due to known physiological condition: Secondary | ICD-10-CM

## 2019-06-16 DIAGNOSIS — Z170 Estrogen receptor positive status [ER+]: Secondary | ICD-10-CM | POA: Diagnosis not present

## 2019-06-16 DIAGNOSIS — C50211 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 female breast: Secondary | ICD-10-CM | POA: Diagnosis not present

## 2019-06-16 DIAGNOSIS — Z510 Encounter for antineoplastic radiation therapy: Secondary | ICD-10-CM | POA: Diagnosis not present

## 2019-06-19 ENCOUNTER — Ambulatory Visit
Admission: RE | Admit: 2019-06-19 | Discharge: 2019-06-19 | Disposition: A | Discharge status: Home / Self Care | Payer: Medicare Other | Source: Ambulatory Visit | Attending: Radiation Oncology | Admitting: Radiation Oncology

## 2019-06-19 ENCOUNTER — Other Ambulatory Visit: Payer: Self-pay

## 2019-06-19 ENCOUNTER — Ambulatory Visit: Payer: Medicare Other | Admitting: Radiation Oncology

## 2019-06-19 DIAGNOSIS — Z510 Encounter for antineoplastic radiation therapy: Secondary | ICD-10-CM | POA: Insufficient documentation

## 2019-06-19 DIAGNOSIS — C50211 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 female breast: Secondary | ICD-10-CM | POA: Diagnosis not present

## 2019-06-19 DIAGNOSIS — Z170 Estrogen receptor positive status [ER+]: Secondary | ICD-10-CM | POA: Insufficient documentation

## 2019-06-20 ENCOUNTER — Ambulatory Visit
Admission: RE | Admit: 2019-06-20 | Discharge: 2019-06-20 | Disposition: A | Discharge status: Home / Self Care | Payer: Medicare Other | Source: Ambulatory Visit | Attending: Radiation Oncology | Admitting: Radiation Oncology

## 2019-06-20 ENCOUNTER — Other Ambulatory Visit: Payer: Self-pay

## 2019-06-20 DIAGNOSIS — C50211 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 female breast: Secondary | ICD-10-CM | POA: Diagnosis not present

## 2019-06-20 DIAGNOSIS — Z510 Encounter for antineoplastic radiation therapy: Secondary | ICD-10-CM | POA: Diagnosis not present

## 2019-06-20 DIAGNOSIS — Z170 Estrogen receptor positive status [ER+]: Secondary | ICD-10-CM | POA: Diagnosis not present

## 2019-06-21 ENCOUNTER — Other Ambulatory Visit: Payer: Self-pay | Admitting: Hematology and Oncology

## 2019-06-21 ENCOUNTER — Ambulatory Visit
Admission: RE | Admit: 2019-06-21 | Discharge: 2019-06-21 | Disposition: A | Discharge status: Home / Self Care | Payer: Medicare Other | Source: Ambulatory Visit | Attending: Hematology and Oncology | Admitting: Hematology and Oncology

## 2019-06-21 ENCOUNTER — Other Ambulatory Visit: Payer: Self-pay

## 2019-06-21 ENCOUNTER — Ambulatory Visit
Admission: RE | Admit: 2019-06-21 | Discharge: 2019-06-21 | Disposition: A | Discharge status: Home / Self Care | Payer: Medicare Other | Source: Ambulatory Visit | Attending: Radiation Oncology | Admitting: Radiation Oncology

## 2019-06-21 ENCOUNTER — Ambulatory Visit: Payer: Medicare Other

## 2019-06-21 DIAGNOSIS — Z510 Encounter for antineoplastic radiation therapy: Secondary | ICD-10-CM | POA: Diagnosis not present

## 2019-06-21 DIAGNOSIS — C50211 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 female breast: Secondary | ICD-10-CM

## 2019-06-21 DIAGNOSIS — Z1231 Encounter for screening mammogram for malignant neoplasm of breast: Secondary | ICD-10-CM | POA: Diagnosis not present

## 2019-06-21 DIAGNOSIS — Z170 Estrogen receptor positive status [ER+]: Secondary | ICD-10-CM

## 2019-06-21 HISTORY — DX: Personal history of irradiation: Z92.3

## 2019-06-22 ENCOUNTER — Ambulatory Visit: Payer: Medicare Other

## 2019-06-22 ENCOUNTER — Ambulatory Visit
Admission: RE | Admit: 2019-06-22 | Discharge: 2019-06-22 | Disposition: A | Discharge status: Home / Self Care | Payer: Medicare Other | Source: Ambulatory Visit | Attending: Radiation Oncology | Admitting: Radiation Oncology

## 2019-06-22 ENCOUNTER — Other Ambulatory Visit: Payer: Self-pay

## 2019-06-22 DIAGNOSIS — Z170 Estrogen receptor positive status [ER+]: Secondary | ICD-10-CM | POA: Diagnosis not present

## 2019-06-22 DIAGNOSIS — Z510 Encounter for antineoplastic radiation therapy: Secondary | ICD-10-CM | POA: Diagnosis not present

## 2019-06-22 DIAGNOSIS — C50211 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 female breast: Secondary | ICD-10-CM | POA: Diagnosis not present

## 2019-06-23 ENCOUNTER — Other Ambulatory Visit: Payer: Self-pay

## 2019-06-23 ENCOUNTER — Ambulatory Visit: Payer: Medicare Other

## 2019-06-23 ENCOUNTER — Ambulatory Visit
Admission: RE | Admit: 2019-06-23 | Discharge: 2019-06-23 | Disposition: A | Discharge status: Home / Self Care | Payer: Medicare Other | Source: Ambulatory Visit | Attending: Radiation Oncology | Admitting: Radiation Oncology

## 2019-06-23 DIAGNOSIS — Z170 Estrogen receptor positive status [ER+]: Secondary | ICD-10-CM | POA: Diagnosis not present

## 2019-06-23 DIAGNOSIS — Z510 Encounter for antineoplastic radiation therapy: Secondary | ICD-10-CM | POA: Diagnosis not present

## 2019-06-23 DIAGNOSIS — C50211 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 female breast: Secondary | ICD-10-CM | POA: Diagnosis not present

## 2019-06-24 ENCOUNTER — Encounter (INDEPENDENT_AMBULATORY_CARE_PROVIDER_SITE_OTHER): Payer: Self-pay

## 2019-06-26 ENCOUNTER — Other Ambulatory Visit: Payer: Self-pay

## 2019-06-26 ENCOUNTER — Ambulatory Visit
Admission: RE | Admit: 2019-06-26 | Discharge: 2019-06-26 | Disposition: A | Discharge status: Home / Self Care | Payer: Medicare Other | Source: Ambulatory Visit | Attending: Radiation Oncology | Admitting: Radiation Oncology

## 2019-06-26 DIAGNOSIS — Z510 Encounter for antineoplastic radiation therapy: Secondary | ICD-10-CM | POA: Diagnosis not present

## 2019-06-26 DIAGNOSIS — C50211 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 female breast: Secondary | ICD-10-CM | POA: Diagnosis not present

## 2019-06-26 DIAGNOSIS — Z170 Estrogen receptor positive status [ER+]: Secondary | ICD-10-CM | POA: Diagnosis not present

## 2019-06-27 ENCOUNTER — Other Ambulatory Visit: Payer: Self-pay

## 2019-06-27 ENCOUNTER — Ambulatory Visit
Admission: RE | Admit: 2019-06-27 | Discharge: 2019-06-27 | Disposition: A | Discharge status: Home / Self Care | Payer: Medicare Other | Source: Ambulatory Visit | Attending: Radiation Oncology | Admitting: Radiation Oncology

## 2019-06-27 ENCOUNTER — Ambulatory Visit: Payer: Medicare Other | Admitting: Psychology

## 2019-06-27 DIAGNOSIS — C50211 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 female breast: Secondary | ICD-10-CM | POA: Diagnosis not present

## 2019-06-27 DIAGNOSIS — Z510 Encounter for antineoplastic radiation therapy: Secondary | ICD-10-CM | POA: Diagnosis not present

## 2019-06-27 DIAGNOSIS — Z170 Estrogen receptor positive status [ER+]: Secondary | ICD-10-CM | POA: Diagnosis not present

## 2019-06-28 ENCOUNTER — Ambulatory Visit
Admission: RE | Admit: 2019-06-28 | Discharge: 2019-06-28 | Disposition: A | Discharge status: Home / Self Care | Payer: Medicare Other | Source: Ambulatory Visit | Attending: Radiation Oncology | Admitting: Radiation Oncology

## 2019-06-28 ENCOUNTER — Ambulatory Visit: Payer: Medicare Other | Admitting: Psychology

## 2019-06-28 ENCOUNTER — Other Ambulatory Visit: Payer: Self-pay

## 2019-06-28 DIAGNOSIS — Z170 Estrogen receptor positive status [ER+]: Secondary | ICD-10-CM | POA: Diagnosis not present

## 2019-06-28 DIAGNOSIS — C50211 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 female breast: Secondary | ICD-10-CM | POA: Diagnosis not present

## 2019-06-28 DIAGNOSIS — Z510 Encounter for antineoplastic radiation therapy: Secondary | ICD-10-CM | POA: Diagnosis not present

## 2019-06-29 ENCOUNTER — Encounter: Payer: Self-pay | Admitting: *Deleted

## 2019-06-29 ENCOUNTER — Ambulatory Visit
Admission: RE | Admit: 2019-06-29 | Discharge: 2019-06-29 | Disposition: A | Discharge status: Home / Self Care | Payer: Medicare Other | Source: Ambulatory Visit | Attending: Radiation Oncology | Admitting: Radiation Oncology

## 2019-06-29 ENCOUNTER — Ambulatory Visit: Payer: Medicare Other

## 2019-06-29 ENCOUNTER — Other Ambulatory Visit: Payer: Self-pay

## 2019-06-29 ENCOUNTER — Encounter: Payer: Self-pay | Admitting: Radiation Oncology

## 2019-06-29 DIAGNOSIS — Z510 Encounter for antineoplastic radiation therapy: Secondary | ICD-10-CM | POA: Diagnosis not present

## 2019-06-29 DIAGNOSIS — Z170 Estrogen receptor positive status [ER+]: Secondary | ICD-10-CM | POA: Diagnosis not present

## 2019-06-29 DIAGNOSIS — C50211 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 female breast: Secondary | ICD-10-CM | POA: Diagnosis not present

## 2019-06-29 NOTE — Progress Notes (Signed)
Patient Care Team: [name], MD as PCP - General (Family Medicine) [name], RN as Oncology Nurse Navigator Mauro Kaufmann, RN as Oncology Nurse Navigator  DIAGNOSIS:    ICD-10-CM   1. 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 breast in female, estrogen receptor positive ([place])  C50.211    Z17.0     SUMMARY OF ONCOLOGIC HISTORY: Oncology History  Breast cancer of upper-inner quadrant of right female breast ([place])  12/05/2015 Mammogram   Right breast mass 2:00 4 cm from nipple: 1.7 cm, T1c N0 stage IA; right breast 2:00 6 cm from nipple 1.3 cm mass, 7 mm satellite nodule between the 2 masses, T1 cN0 stage IA   12/06/2015 Initial Diagnosis   Right breast biopsy 2:00 6 cm from nipple: Grade 2-3 invasive ductal cancer, ER 90%, PR 2%, Ki-67 80%, HER-2 positive ratio 2.07; right biopsy 2:00 4 cm from nipple: IDC with DCIS, ER 95%, PR 10%, Ki-67 90%, HER-2 negative ratio 1.27   12/18/2015 Breast MRI   3 enhancing masses in the upper inner quadrant right breast spanning an area 3.7 cm (1.7 cm, 1 cm, 1.4 cm) no lymph node enlargement   12/31/2015 - 04/15/2016 Neo-Adjuvant Chemotherapy   [facility] Perjeta 6 cycles followed by Herceptin, Perjeta maintenance for 1 year completed 10/20/2016   01/08/2016 - 01/12/2016 Hospital Admission   Neutropenic fever hospitalization (because patient did not receive Neulasta with cycle 1)   01/10/2016 Miscellaneous   Genetic testing was normal did not reveal any mutations   04/16/2016 Breast MRI   Near CR to therapy. Previous 2 biopsied massses are no longer seen. Third satellite lesion is smaller from 10 mm to 5.3 mm, no abnormal LN.   05/19/2016 Surgery   Right mastectomy: IDC grade 3, 1.3 cm, low-grade DCIS, LVIDS present, margins negative, 0/1 lymph node negative, ypT1CypN0 stage IA; repeat HER-2 negative ratio 1.41   05/19/2016 Surgery   Right breast reconstruction with tissue expander. Acellular dermis for breast reconstruction  (Dr.Thimappa)    06/22/2016 -  Anti-estrogen oral therapy   Letrozole 2.5 mg daily   11/01/2018 Relapse/Recurrence   Patient palpated lump in the UIQ at surgical scar of reconstructed right breast. US of the right breast showed a 1.8cm mass suspicious for recurrent breast cancer. Biopsy showed grade 2 invasive ductal carcinoma, HER-2 negative (1+), ER 90%, PR negative, Ki67 40%.   11/11/2018 Oncotype testing   Oncotype score 52: Greater than 39% risk of distant recurrence with hormone therapy alone, chemo benefit greater than 15%   11/30/2018 Surgery   Right lumpectomy [name]): IDC, grade 3, 2.1cm, perineural invasion present, carcinoma present focally at the posterior margin and involving skeletal muscle.   11/30/2018 Cancer Staging   Staging form: Breast, AJCC 8th Edition - Pathologic stage from 11/30/2018: Stage IIA (pT2, pN0, cM0, G3, ER+, PR-, HER2-) - Signed by Gardenia Phlegm, NP on 12/14/2018   12/23/2018 -  Chemotherapy   The patient had palonosetron (ALOXI) injection 0.25 mg, 0.25 mg, Intravenous,  Once, 9 of 9 cycles Administration: 0.25 mg (12/23/2018), 0.25 mg (01/13/2019), 0.25 mg (02/03/2019), 0.25 mg (02/24/2019), 0.25 mg (03/17/2019), 0.25 mg (04/07/2019) pegfilgrastim (NEULASTA ONPRO KIT) injection 6 mg, 6 mg, Subcutaneous, Once, 9 of 9 cycles Administration: 6 mg (12/23/2018), 6 mg (01/13/2019), 6 mg (02/03/2019), 6 mg (02/24/2019), 6 mg (03/17/2019), 6 mg (04/07/2019) cyclophosphamide (CYTOXAN) 1,060 mg in sodium chloride 0.9 % 250 mL chemo infusion, 600 mg/m2 = 1,060 mg, Intravenous,  Once, 9 of 9  cycles Administration: 1,060 mg (12/23/2018), 1,060 mg (01/13/2019), 1,060 mg (02/03/2019), 1,060 mg (02/24/2019), 1,020 mg (03/17/2019), 1,020 mg (04/07/2019) DOCEtaxel (TAXOTERE) 130 mg in sodium chloride 0.9 % 250 mL chemo infusion, 75 mg/m2 = 130 mg, Intravenous,  Once, 9 of 9 cycles Administration: 130 mg (12/23/2018), 130 mg (01/13/2019), 130 mg (02/03/2019), 130 mg (02/24/2019),  130 mg (03/17/2019), 130 mg (04/07/2019) fosaprepitant (EMEND) 150 mg in sodium chloride 0.9 % 145 mL IVPB, , Intravenous,  Once, 9 of 9 cycles Administration:  (12/23/2018),  (02/03/2019),  (01/13/2019),  (02/24/2019),  (03/17/2019),  (04/07/2019)  for chemotherapy treatment.    05/18/2019 -  Radiation Therapy   Adjuvant radiation     CHIEF COMPLIANT: Follow-up of right breast cancer to begin abemaciclib   INTERVAL HISTORY: Patricia Chandler is a 64 y.o. with above-mentioned history of recurrent right breast cancerwho underwent a lumpectomy, adjuvant chemotherapy with Taxotere and Cytoxan, and completed radiation therapy on 06/29/19. She is currently on anastrozole and tolerating it well.  She is here to discuss the pros and cons of abemaciclib.  She is leaning towards taking the medication. She has profound radiation dermatitis.  ALLERGIES:  has No Known Allergies.  MEDICATIONS:  Current Outpatient Medications  Medication Sig Dispense Refill  . anastrozole (ARIMIDEX) 1 MG tablet Take 1 tablet (1 mg total) by mouth daily. 90 tablet 3  . Biotin 1 MG CAPS Take by mouth.    . calcium-vitamin D (OSCAL WITH D) 500-200 MG-UNIT tablet Take 1 tablet by mouth daily with breakfast.    . denosumab (PROLIA) 60 MG/ML SOLN injection Inject 60 mg into the skin every 6 (six) months. Administer in upper arm, thigh, or abdomen    . Lactobacillus (PROBIOTIC ACIDOPHILUS PO) Take 1 capsule by mouth daily.     Marland Kitchen LORazepam (ATIVAN) 0.5 MG tablet Take 1 tablet (0.5 mg total) by mouth at bedtime as needed for sleep. 30 tablet 3  . omeprazole (PRILOSEC) 20 MG capsule Take 20 mg by mouth daily as needed (heartburn).    . zolpidem (AMBIEN) 5 MG tablet TAKE 1 TABLET BY MOUTH EACH NIGHT AT BEDTIME 30 tablet 3   No current facility-administered medications for this visit.    PHYSICAL EXAMINATION: ECOG PERFORMANCE STATUS: 1 - Symptomatic but completely ambulatory  Vitals:   06/30/19 0949  BP: (!) 122/93    Pulse: 98  Resp: 17  Temp: 97.8 F (36.6 C)  SpO2: 100%   Filed Weights   06/30/19 0949  Weight: 138 lb 11.2 oz (62.9 kg)    LABORATORY DATA:  I have reviewed the data as listed CMP Latest Ref Rng & Units 04/07/2019 03/17/2019 02/24/2019  Glucose 70 - 99 mg/dL 85 96 83  BUN 8 - 23 mg/dL '11 12 18  ' Creatinine 0.44 - 1.00 mg/dL 0.68 0.75 0.72  Sodium 135 - 145 mmol/L 140 141 138  Potassium 3.5 - 5.1 mmol/L 3.8 4.0 3.8  Chloride 98 - 111 mmol/L 107 107 106  CO2 22 - 32 mmol/L '25 24 22  ' Calcium 8.9 - 10.3 mg/dL 8.6(L) 9.0 9.3  Total Protein 6.5 - 8.1 g/dL 5.6(L) 5.8(L) 6.1(L)  Total Bilirubin 0.3 - 1.2 mg/dL 0.4 0.3 0.3  Alkaline Phos 38 - 126 U/L 51 63 60  AST 15 - 41 U/L '18 19 18  ' ALT 0 - 44 U/L '13 17 18    ' Lab Results  Component Value Date   WBC 5.4 04/07/2019   HGB 9.5 (L) 04/07/2019   HCT 29.0 (L)  04/07/2019   MCV 95.7 04/07/2019   PLT 289 04/07/2019   NEUTROABS 4.0 04/07/2019    ASSESSMENT & PLAN:  Breast cancer of upper-inner quadrant of right female breast ([place]) Right breast biopsy 2:00 6 cm from nipple: Grade 2-3 invasive ductal cancer, ER 90%, PR 2%, Ki-67 80%, HER-2 positive ratio 2.07; right biopsy 2:00 4 cm from nipple: IDC with DCIS, ER 95%, PR 10%, Ki-67 90%, HER-2 negative ratio 1.27 Breast MRI08/30/2017: 3 enhancing masses in the upper inner quadrant right breast spanning an area 3.7 cm (1.7 cm, 1 cm, 1.4 cm) no lymph node enlargement Clinical stage: T2N0 (stage 2A)  Prior treatment summary: 1. Genetic counseling: Normal did not reveal any abnormal mutations 2. Neoadjuvant chemotherapy. [facility] Perjeta 6 cycles followed by Herceptin And Perjetamaintenance completed 10/20/2016) from 12/31/15 to 04/15/16 3. Right mastectomy with reconstruction: 05/19/2016:Right mastectomy: IDC grade 3, 1.3 cm, low-grade DCIS, LVIDS present, margins negative, 0/1 lymph node negative, ypT1CypN0 stage IA; repeat HER-2 negative ratio 1.41 4. Adjuvant antiestrogen therapy With  letrozole 2.5 mg daily started March 2018 -------------------------------------------------------------------------------------------------------------------------------------------------- Relapsed breast cancer: Subcutaneous disease: 1.8 cm measured by ultrasound Biopsy revealed grade 2-3 IDC ER 90% PR 0% HER-2 negative Ki-67 40% 11/30/2018 right lumpectomy [name]): IDC, grade 3, 2.1cm, perineural invasion present, carcinoma present focally at the posterior margin and involving skeletal muscle. Oncotype score 52: Greater than 39% risk of distant recurrence with hormone therapy alone, chemo benefit greater than 15%  Recommendation: 1.Surgery to remove the tumor8/03/2019 2.Adjuvant chemotherapy withTaxotere and Cytoxan x6 cycles 12/30/2018 -04/07/2019 3.Adjuvant radiation started 05/19/2019-06/29/2019 4.Follow-up adjuvant antiestrogen therapy with anastrozole started 05/26/2019 with abemaciclib to start after radiation ------------------------------------------------------------------------------------------------------------------------------------ Jugular vein DVT: On anticoagulation Chemotherapy-induced anemia Anastrozole toxicities: None  We once again discussed the risks and benefits of abemaciclib.  Profound radiation dermatitis: I discussed with her that we would like her to heal and recover completely from this before starting abemaciclib.  I discussed with her that she is likely to have a 4.5% decrease in the invasive disease-free survival rate by taking the abemaciclib over anastrozole therapy.  She will return back to see [place] in 2 months we will recheck her blood work and then send a prescription for abdominal cycle.  Return to clinic in 2 months  No orders of the defined types were placed in this encounter.  The patient has a good understanding of the overall plan. she agrees with it. she will call with any problems that may develop before the next visit  here.  Total time spent: 30 mins including face to face time and time spent for planning, charting and coordination of care  Nicholas Lose, MD 06/30/2019  I, Cloyde Reams Dorshimer, am acting as scribe for Dr. Nicholas Lose.  I have reviewed the above documentation for accuracy and completeness, and I agree with the above.

## 2019-06-30 ENCOUNTER — Ambulatory Visit: Payer: Medicare Other

## 2019-06-30 ENCOUNTER — Encounter (INDEPENDENT_AMBULATORY_CARE_PROVIDER_SITE_OTHER): Payer: Self-pay

## 2019-06-30 ENCOUNTER — Inpatient Hospital Stay: Payer: Medicare Other | Attending: Hematology and Oncology | Admitting: Hematology and Oncology

## 2019-06-30 ENCOUNTER — Other Ambulatory Visit: Payer: Self-pay

## 2019-06-30 DIAGNOSIS — Z170 Estrogen receptor positive status [ER+]: Secondary | ICD-10-CM

## 2019-06-30 DIAGNOSIS — Z7901 Long term (current) use of anticoagulants: Secondary | ICD-10-CM | POA: Diagnosis not present

## 2019-06-30 DIAGNOSIS — C50211 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 female breast: Secondary | ICD-10-CM | POA: Diagnosis not present

## 2019-06-30 DIAGNOSIS — L598 Other specified disorders of the skin and subcutaneous tissue related to radiation: Secondary | ICD-10-CM | POA: Insufficient documentation

## 2019-06-30 DIAGNOSIS — I82C19 Acute embolism and thrombosis of unspecified internal jugular vein: Secondary | ICD-10-CM | POA: Insufficient documentation

## 2019-06-30 NOTE — Assessment & Plan Note (Signed)
Right breast biopsy 2:00 6 cm from nipple: Grade 2-3 invasive ductal cancer, ER 90%, PR 2%, Ki-67 80%, HER-2 positive ratio 2.07; right biopsy 2:00 4 cm from nipple: IDC with DCIS, ER 95%, PR 10%, Ki-67 90%, HER-2 negative ratio 1.27 Breast MRI08/30/2017: 3 enhancing masses in the upper inner quadrant right breast spanning an area 3.7 cm (1.7 cm, 1 cm, 1.4 cm) no lymph node enlargement Clinical stage: T2N0 (stage 2A)  Prior treatment summary: 1. Genetic counseling: Normal did not reveal any abnormal mutations 2. Neoadjuvant chemotherapy. [facility] Perjeta 6 cycles followed by Herceptin And Perjetamaintenance completed 10/20/2016) from 12/31/15 to 04/15/16 3. Right mastectomy with reconstruction: 05/19/2016:Right mastectomy: IDC grade 3, 1.3 cm, low-grade DCIS, LVIDS present, margins negative, 0/1 lymph node negative, ypT1CypN0 stage IA; repeat HER-2 negative ratio 1.41 4. Adjuvant antiestrogen therapy With letrozole 2.5 mg daily started March 2018 -------------------------------------------------------------------------------------------------------------------------------------------------- Relapsed breast cancer: Subcutaneous disease: 1.8 cm measured by ultrasound Biopsy revealed grade 2-3 IDC ER 90% PR 0% HER-2 negative Ki-67 40% 11/30/2018 right lumpectomy [name]): IDC, grade 3, 2.1cm, perineural invasion present, carcinoma present focally at the posterior margin and involving skeletal muscle. Oncotype score 52: Greater than 39% risk of distant recurrence with hormone therapy alone, chemo benefit greater than 15%  Recommendation: 1.Surgery to remove the tumor8/03/2019 2.Adjuvant chemotherapy withTaxotere and Cytoxan x6 cycles 12/30/2018 -04/07/2019 3.Adjuvant radiation started 05/19/2019-06/29/2019 4.Follow-up adjuvant antiestrogen therapy with anastrozole started 05/26/2019 with abemaciclib to start after  radiation ------------------------------------------------------------------------------------------------------------------------------------ Jugular vein DVT: On anticoagulation Chemotherapy-induced anemia Anastrozole toxicities:  We once again discussed the risks and benefits of abemaciclib. She can start this within the next week. I would like to see her back 2 weeks after starting treatment for assessment of toxicities.

## 2019-07-07 ENCOUNTER — Telehealth: Payer: Self-pay | Admitting: Hematology and Oncology

## 2019-07-07 ENCOUNTER — Encounter (INDEPENDENT_AMBULATORY_CARE_PROVIDER_SITE_OTHER): Payer: Self-pay

## 2019-07-07 NOTE — Telephone Encounter (Signed)
Scheduled per 03/12 los, patient has been called and notified of upcoming appointments.

## 2019-07-19 ENCOUNTER — Ambulatory Visit (INDEPENDENT_AMBULATORY_CARE_PROVIDER_SITE_OTHER): Payer: Medicare Other

## 2019-07-19 ENCOUNTER — Other Ambulatory Visit: Payer: Self-pay

## 2019-07-19 DIAGNOSIS — M810 Age-related osteoporosis without current pathological fracture: Secondary | ICD-10-CM

## 2019-07-19 MED ORDER — DENOSUMAB 60 MG/ML ~~LOC~~ SOSY
60.0000 mg | PREFILLED_SYRINGE | Freq: Once | SUBCUTANEOUS | Status: AC
  Administered 2019-07-19: 60 mg via SUBCUTANEOUS

## 2019-07-21 ENCOUNTER — Encounter (INDEPENDENT_AMBULATORY_CARE_PROVIDER_SITE_OTHER): Payer: Self-pay

## 2019-07-26 NOTE — Progress Notes (Signed)
Sent to Dr. Tamala Julian to sign

## 2019-07-28 ENCOUNTER — Encounter (INDEPENDENT_AMBULATORY_CARE_PROVIDER_SITE_OTHER): Payer: Self-pay

## 2019-08-04 ENCOUNTER — Ambulatory Visit
Admission: RE | Admit: 2019-08-04 | Discharge: 2019-08-04 | Disposition: A | Discharge status: Home / Self Care | Payer: Medicare Other | Source: Ambulatory Visit | Attending: Radiation Oncology | Admitting: Radiation Oncology

## 2019-08-04 ENCOUNTER — Encounter: Payer: Self-pay | Admitting: Radiation Oncology

## 2019-08-04 ENCOUNTER — Encounter (INDEPENDENT_AMBULATORY_CARE_PROVIDER_SITE_OTHER): Payer: Self-pay

## 2019-08-04 DIAGNOSIS — Z170 Estrogen receptor positive status [ER+]: Secondary | ICD-10-CM

## 2019-08-04 DIAGNOSIS — C50211 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 female breast: Secondary | ICD-10-CM

## 2019-08-04 NOTE — Progress Notes (Signed)
Radiation Oncology         [phone]) [phone] ________________________________  Name: Patricia Chandler MRN: [id number]  Date: 08/04/2019  DOB: [birth year]  Follow-Up Visit Note by telephone.  The patient opted for telemedicine to maximize safety during the pandemic.  MyChart video was not obtainable.  Outpatient  CC: [name], MD  Kelton Pillar, MD  Diagnosis and Prior Radiotherapy:    ICD-10-CM   1. 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 breast in female, estrogen receptor positive ([place])  C50.211    Z17.0    Cancer Staging Breast cancer of upper-inner quadrant of right female breast [facility]) Staging form: Breast, AJCC 8th Edition - Clinical stage from 11/01/2018: Stage IA (rcT1c, cN0, cM0, G2, ER+, PR-, HER2-) - Unsigned - Pathologic stage from 11/30/2018: Stage IIA (pT2, pN0, cM0, G3, ER+, PR-, HER2-) - Signed by Gardenia Phlegm, NP on 12/14/2018   CHIEF COMPLAINT: Here for follow-up and surveillance of breast cancer  Narrative:  The patient returns today for routine follow-up. She is doing much better, skin has healed significantly. It is still somewhat tender.  She is gardening w/ sun protective tops.  She feels like her breasts are lopsided after reconstruction.  She would like to wait a little more before seeing her plastic surgeon or PT.      She notes some new "red spots near the scar that wax and wane" over the chest wall.  She notes that she has similar spots on other parts of her body that come and go.  She believes they become more prominent when she is working outside in the [place]:  has No Known Allergies.  Meds: Current Outpatient Medications  Medication Sig Dispense Refill  . anastrozole (ARIMIDEX) 1 MG tablet Take 1 tablet (1 mg total) by mouth daily. 90 tablet 3  . Biotin 1 MG CAPS Take by mouth.    . calcium-vitamin D (OSCAL WITH D) 500-200 MG-UNIT tablet Take 1 tablet by mouth daily with breakfast.    .  denosumab (PROLIA) 60 MG/ML SOLN injection Inject 60 mg into the skin every 6 (six) months. Administer in upper arm, thigh, or abdomen    . Lactobacillus (PROBIOTIC ACIDOPHILUS PO) Take 1 capsule by mouth daily.     Marland Kitchen LORazepam (ATIVAN) 0.5 MG tablet Take 1 tablet (0.5 mg total) by mouth at bedtime as needed for sleep. 30 tablet 3  . omeprazole (PRILOSEC) 20 MG capsule Take 20 mg by mouth daily as needed (heartburn).    . zolpidem (AMBIEN) 5 MG tablet TAKE 1 TABLET BY MOUTH EACH NIGHT AT BEDTIME 30 tablet 3   No current facility-administered medications for this encounter.    Physical Findings: The patient is in no acute distress. Patient is alert and oriented.  vitals were not taken for this visit. .       Lab Findings: Lab Results  Component Value Date   WBC 5.4 04/07/2019   HGB 9.5 (L) 04/07/2019   HCT 29.0 (L) 04/07/2019   MCV 95.7 04/07/2019   PLT 289 04/07/2019    Radiographic Findings: No results found.  Impression/Plan: This is a delightful 64 year old woman, healing well radiotherapy to the breast tissue.  Continue skin care with topical Vitamin E Oil and / or lotion for at least 2 more months for further healing.  We discussed sun hygiene.  Regarding the red spots around her scar, I recommended that  she send a photo of her chest wall to me.  I also recommended that she show  the spots to medical oncology at her follow-up in 1 month.  Out of caution, if they appear on the photo to require more urgent inspection, she knows that we may need to refer her back to her surgeon.  I also asked Corporate investment banker of nursing to contact the patient if they do not receive these photos today.   I asked for the photos to be shared with me and with the patient's surgeon, Dr. [name].  I offered a referral back to plastic surgery and/or physical therapy given the patient's lack of satisfaction with her breast symmetry.  She also reports that she has firmness where the reconstruction was  performed.  At this time she declines these referrals.  She knows to let her physicians know if she changes her mind about that.  I will see her back on an as-needed basis.  She knows to use follow-up with medical oncology next month as scheduled in person.  This encounter was provided by telemedicine platform by telephone.  The patient was offered an in person appointment and opted for telemedicine to maximize safety during the pandemic.  MyChart video was not obtainable for the patient. The patient has given verbal consent for this type of encounter and has been advised to only accept a meeting of this type in a secure network environment. The time spent during this encounter was 20 minutes. The attendants for this meeting include Eppie Gibson  and Durwin Reges.  During the encounter, Eppie Gibson was located at [facility] Radiation Oncology Department.  Keshana Klemz was located at home.    _____________________________________   Eppie Gibson, MD

## 2019-08-08 ENCOUNTER — Encounter: Payer: Self-pay | Admitting: Radiation Oncology

## 2019-08-08 NOTE — Progress Notes (Signed)
I left a voicemail for Patricia Chandler  recommending that she see Dr. [name] for skin exam to ensure that the skin changes (red spots) at her mastectomy scar are within normal limits.  I encouraged her to contact me if she has any further questions. -----------------------------------  Eppie Gibson, MD

## 2019-08-09 NOTE — Progress Notes (Signed)
Error. Please disregard

## 2019-08-11 ENCOUNTER — Encounter (INDEPENDENT_AMBULATORY_CARE_PROVIDER_SITE_OTHER): Payer: Self-pay

## 2019-08-11 NOTE — Progress Notes (Signed)
Spoke with Jacinto Halim RN at [facility] to see if anyone from Dr. Cristal Generous office had been in contact with patient to have surgical incision assessed by Dr. [name] or a colleague at the practice. Abigail Butts went and spoke with Dr. [name] and told me that per Dr. [name]: someone from their department would get in touch with the patient and handle everything. Will continue to support as needed.

## 2019-08-17 DIAGNOSIS — D1801 Hemangioma of skin and subcutaneous tissue: Secondary | ICD-10-CM | POA: Diagnosis not present

## 2019-08-18 ENCOUNTER — Encounter (INDEPENDENT_AMBULATORY_CARE_PROVIDER_SITE_OTHER): Payer: Self-pay

## 2019-08-25 ENCOUNTER — Encounter (INDEPENDENT_AMBULATORY_CARE_PROVIDER_SITE_OTHER): Payer: Self-pay

## 2019-08-25 NOTE — Progress Notes (Signed)
  Patient Name: Patricia Chandler MRN: [id number] DOB: 1955-04-22 Referring Physician: Kelton Pillar (Profile Not Attached) Date of Service: 08/02/2019 [facility], [place]                                                        End Of Treatment Note  Diagnoses: C50.211-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 female breast  Cancer Staging:  Cancer Staging Breast cancer of upper-inner quadrant of right female breast [facility]) Staging form: Breast, AJCC 8th Edition - Clinical stage from 11/01/2018: Stage IA (rcT1c, cN0, cM0, G2, ER+, PR-, HER2-) - Unsigned - Pathologic stage from 11/30/2018: Stage IIA (pT2, pN0, cM0, G3, ER+, PR-, HER2-) - Signed by Gardenia Phlegm, NP on 12/14/2018  Intent: Curative  Radiation Treatment Dates: 05/17/2019 through 06/29/2019 Site Technique Total Dose (Gy) Dose per Fx (Gy) Completed Fx Beam Energies  Chest, Right: Chest_Rt_Bst Electron 14/14 2 7/7 9E  Chest Wall, Right: CW_Rt_axilla 3D 46/46 2 23/23 6X, 10X  Chest Wall, Right: CW_Rt 3D 50/50 2 25/25 6X, 10X   Narrative: The patient tolerated radiation therapy relatively well.    Plan: The patient is scheduled to follow-up with radiation oncology in 37mo -----------------------------------  SEppie Gibson MD

## 2019-08-31 NOTE — Progress Notes (Signed)
Patient Care Team: [name], MD as PCP - General (Family Medicine) [name], RN as Oncology Nurse Navigator Mauro Kaufmann, RN as Oncology Nurse Navigator  DIAGNOSIS:    ICD-10-CM   1. 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 breast in female, estrogen receptor positive ([place])  C50.211 MR BREAST BILATERAL W WO CONTRAST INC CAD   Z17.0   2. Post-menopausal  Z78.0 DG Bone Density    SUMMARY OF ONCOLOGIC HISTORY: Oncology History  Breast cancer of upper-inner quadrant of right female breast ([place])  12/05/2015 Mammogram   Right breast mass 2:00 4 cm from nipple: 1.7 cm, T1c N0 stage IA; right breast 2:00 6 cm from nipple 1.3 cm mass, 7 mm satellite nodule between the 2 masses, T1 cN0 stage IA   12/06/2015 Initial Diagnosis   Right breast biopsy 2:00 6 cm from nipple: Grade 2-3 invasive ductal cancer, ER 90%, PR 2%, Ki-67 80%, HER-2 positive ratio 2.07; right biopsy 2:00 4 cm from nipple: IDC with DCIS, ER 95%, PR 10%, Ki-67 90%, HER-2 negative ratio 1.27   12/18/2015 Breast MRI   3 enhancing masses in the upper inner quadrant right breast spanning an area 3.7 cm (1.7 cm, 1 cm, 1.4 cm) no lymph node enlargement   12/31/2015 - 04/15/2016 Neo-Adjuvant Chemotherapy   [facility] Perjeta 6 cycles followed by Herceptin, Perjeta maintenance for 1 year completed 10/20/2016   01/08/2016 - 01/12/2016 Hospital Admission   Neutropenic fever hospitalization (because patient did not receive Neulasta with cycle 1)   01/10/2016 Miscellaneous   Genetic testing was normal did not reveal any mutations   04/16/2016 Breast MRI   Near CR to therapy. Previous 2 biopsied massses are no longer seen. Third satellite lesion is smaller from 10 mm to 5.3 mm, no abnormal LN.   05/19/2016 Surgery   Right mastectomy: IDC grade 3, 1.3 cm, low-grade DCIS, LVIDS present, margins negative, 0/1 lymph node negative, ypT1CypN0 stage IA; repeat HER-2 negative ratio 1.41   05/19/2016 Surgery   Right breast  reconstruction with tissue expander. Acellular dermis for breast reconstruction (Dr.Thimappa)    06/22/2016 -  Anti-estrogen oral therapy   Letrozole 2.5 mg daily   11/01/2018 Relapse/Recurrence   Patient palpated lump in the UIQ at surgical scar of reconstructed right breast. US of the right breast showed a 1.8cm mass suspicious for recurrent breast cancer. Biopsy showed grade 2 invasive ductal carcinoma, HER-2 negative (1+), ER 90%, PR negative, Ki67 40%.   11/11/2018 Oncotype testing   Oncotype score 52: Greater than 39% risk of distant recurrence with hormone therapy alone, chemo benefit greater than 15%   11/30/2018 Surgery   Right lumpectomy [name]): IDC, grade 3, 2.1cm, perineural invasion present, carcinoma present focally at the posterior margin and involving skeletal muscle.   11/30/2018 Cancer Staging   Staging form: Breast, AJCC 8th Edition - Pathologic stage from 11/30/2018: Stage IIA (pT2, pN0, cM0, G3, ER+, PR-, HER2-) - Signed by Gardenia Phlegm, NP on 12/14/2018   12/23/2018 -  Chemotherapy   The patient had palonosetron (ALOXI) injection 0.25 mg, 0.25 mg, Intravenous,  Once, 9 of 9 cycles Administration: 0.25 mg (12/23/2018), 0.25 mg (01/13/2019), 0.25 mg (02/03/2019), 0.25 mg (02/24/2019), 0.25 mg (03/17/2019), 0.25 mg (04/07/2019) pegfilgrastim (NEULASTA ONPRO KIT) injection 6 mg, 6 mg, Subcutaneous, Once, 9 of 9 cycles Administration: 6 mg (12/23/2018), 6 mg (01/13/2019), 6 mg (02/03/2019), 6 mg (02/24/2019), 6 mg (03/17/2019), 6 mg (04/07/2019) cyclophosphamide (CYTOXAN) 1,060 mg in sodium chloride 0.9 %  250 mL chemo infusion, 600 mg/m2 = 1,060 mg, Intravenous,  Once, 9 of 9 cycles Administration: 1,060 mg (12/23/2018), 1,060 mg (01/13/2019), 1,060 mg (02/03/2019), 1,060 mg (02/24/2019), 1,020 mg (03/17/2019), 1,020 mg (04/07/2019) DOCEtaxel (TAXOTERE) 130 mg in sodium chloride 0.9 % 250 mL chemo infusion, 75 mg/m2 = 130 mg, Intravenous,  Once, 9 of 9 cycles Administration:  130 mg (12/23/2018), 130 mg (01/13/2019), 130 mg (02/03/2019), 130 mg (02/24/2019), 130 mg (03/17/2019), 130 mg (04/07/2019) fosaprepitant (EMEND) 150 mg in sodium chloride 0.9 % 145 mL IVPB, , Intravenous,  Once, 9 of 9 cycles Administration:  (12/23/2018),  (02/03/2019),  (01/13/2019),  (02/24/2019),  (03/17/2019),  (04/07/2019)  for chemotherapy treatment.    05/18/2019 - 06/29/2019 Radiation Therapy   Adjuvant radiation     CHIEF COMPLIANT: Follow-up of right breast cancer on anastrozole, begin abemaciclib   INTERVAL HISTORY: Patricia Chandler is a 64 y.o. with above-mentioned history of recurrent right breast cancerwho underwent a lumpectomy,adjuvant chemotherapy with Taxotere and Cytoxan, radiation, and is currently on anastrozole. She presents to the clinic today for follow-up and to begin abemaciclib.   ALLERGIES:  has No Known Allergies.  MEDICATIONS:  Current Outpatient Medications  Medication Sig Dispense Refill  . abemaciclib (VERZENIO) 200 MG tablet Take 1 tablet (200 mg total) by mouth 2 (two) times daily. Swallow tablets whole. Do not chew, crush, or split tablets before swallowing. 60 tablet 3  . anastrozole (ARIMIDEX) 1 MG tablet Take 1 tablet (1 mg total) by mouth daily. 90 tablet 3  . Biotin 1 MG CAPS Take by mouth.    . calcium-vitamin D (OSCAL WITH D) 500-200 MG-UNIT tablet Take 1 tablet by mouth daily with breakfast.    . denosumab (PROLIA) 60 MG/ML SOLN injection Inject 60 mg into the skin every 6 (six) months. Administer in upper arm, thigh, or abdomen    . Lactobacillus (PROBIOTIC ACIDOPHILUS PO) Take 1 capsule by mouth daily.     Marland Kitchen LORazepam (ATIVAN) 0.5 MG tablet Take 1 tablet (0.5 mg total) by mouth at bedtime as needed for sleep. 30 tablet 3  . omeprazole (PRILOSEC) 20 MG capsule Take 20 mg by mouth daily as needed (heartburn).    . zolpidem (AMBIEN) 5 MG tablet TAKE 1 TABLET BY MOUTH EACH NIGHT AT BEDTIME 30 tablet 3   No current facility-administered  medications for this visit.    PHYSICAL EXAMINATION: ECOG PERFORMANCE STATUS: 1 - Symptomatic but completely ambulatory  Vitals:   09/01/19 1145  BP: 119/89  Pulse: 87  Resp: 18  Temp: 97.8 F (36.6 C)  SpO2: 100%   Filed Weights   09/01/19 1145  Weight: 144 lb (65.3 kg)    LABORATORY DATA:  I have reviewed the data as listed CMP Latest Ref Rng & Units 09/01/2019 04/07/2019 03/17/2019  Glucose 70 - 99 mg/dL 95 85 96  BUN 8 - 23 mg/dL 24(H) 11 12  Creatinine 0.44 - 1.00 mg/dL 0.76 0.68 0.75  Sodium 135 - 145 mmol/L 138 140 141  Potassium 3.5 - 5.1 mmol/L 5.1 3.8 4.0  Chloride 98 - 111 mmol/L 105 107 107  CO2 22 - 32 mmol/L '24 25 24  ' Calcium 8.9 - 10.3 mg/dL 9.1 8.6(L) 9.0  Total Protein 6.5 - 8.1 g/dL 6.6 5.6(L) 5.8(L)  Total Bilirubin 0.3 - 1.2 mg/dL 0.5 0.4 0.3  Alkaline Phos 38 - 126 U/L 59 51 63  AST 15 - 41 U/L '18 18 19  ' ALT 0 - 44 U/L 14 13 17  Lab Results  Component Value Date   WBC 8.6 09/01/2019   HGB 12.8 09/01/2019   HCT 38.4 09/01/2019   MCV 88.3 09/01/2019   PLT 239 09/01/2019   NEUTROABS 6.9 09/01/2019    ASSESSMENT & PLAN:  Breast cancer of upper-inner quadrant of right female breast ([place]) Right breast biopsy 2:00 6 cm from nipple: Grade 2-3 invasive ductal cancer, ER 90%, PR 2%, Ki-67 80%, HER-2 positive ratio 2.07; right biopsy 2:00 4 cm from nipple: IDC with DCIS, ER 95%, PR 10%, Ki-67 90%, HER-2 negative ratio 1.27 Breast MRI08/30/2017: 3 enhancing masses in the upper inner quadrant right breast spanning an area 3.7 cm (1.7 cm, 1 cm, 1.4 cm) no lymph node enlargement Clinical stage: T2N0 (stage 2A)  Prior treatment summary: 1. Genetic counseling: Normal did not reveal any abnormal mutations 2. Neoadjuvant chemotherapy. [facility] Perjeta 6 cycles followed by Herceptin And Perjetamaintenance completed 10/20/2016) from 12/31/15 to 04/15/16 3. Right mastectomy with reconstruction: 05/19/2016:Right mastectomy: IDC grade 3, 1.3 cm, low-grade DCIS,  LVIDS present, margins negative, 0/1 lymph node negative, ypT1CypN0 stage IA; repeat HER-2 negative ratio 1.41 4. Adjuvant antiestrogen therapy With letrozole 2.5 mg daily started March 2018 -------------------------------------------------------------------------------------------------------------------------------------------------- Relapsed breast cancer: Subcutaneous disease: 1.8 cm measured by ultrasound Biopsy revealed grade 2-3 IDC ER 90% PR 0% HER-2 negative Ki-67 40% 11/30/2018 right lumpectomy [name]): IDC, grade 3, 2.1cm, perineural invasion present, carcinoma present focally at the posterior margin and involving skeletal muscle. Oncotype score 52: Greater than 39% risk of distant recurrence with hormone therapy alone, chemo benefit greater than 15%  Recommendation: 1.Surgery to remove the tumor8/03/2019 2.Adjuvant chemotherapy withTaxotere and Cytoxan x6 cycles 12/30/2018 -04/07/2019 3.Adjuvant radiationstarted 05/19/2019-06/29/2019 4.Follow-up adjuvant antiestrogen therapy with anastrozole started 05/26/2019 with abemaciclib to start after radiation ------------------------------------------------------------------------------------------------------------------------------------ Jugular vein DVT: On anticoagulation Chemotherapy-induced anemia Anastrozole toxicities: None  We once again discussed the risks and benefits of abemaciclib.  Profound radiation dermatitis: Market improvement Treatment plan: Start abemaciclib 200 mg p.o. twice daily.  Counseled her extensively about diarrhea fatigue cytopenias and other toxicities.  Bone density will also be ordered. Breast MRI will be ordered for August 2021.  Return to clinic in 2 weeks to assess tolerability to abemaciclib.    Orders Placed This Encounter  Procedures  . MR BREAST BILATERAL W WO CONTRAST INC CAD    Standing Status:   Future    Standing Expiration Date:   10/31/2020    Order Specific Question:    ** REASON FOR EXAM (FREE TEXT)    Answer:   High risk of recurrence of breast cancer    Order Specific Question:   If indicated for the ordered procedure, I authorize the administration of contrast media per Radiology protocol    Answer:   Yes    Order Specific Question:   What is the patient's sedation requirement?    Answer:   No Sedation    Order Specific Question:   Does the patient have a pacemaker or implanted devices?    Answer:   No    Order Specific Question:   Radiology Contrast Protocol - do NOT remove file path    Answer:   [email or web address]    Order Specific Question:   Preferred imaging location?    Answer:   [facility] (table limit-550lbs)  . DG Bone Density    Standing Status:   Future    Standing Expiration Date:   08/31/2020    Order Specific Question:   Reason for Exam (SYMPTOM  OR DIAGNOSIS  REQUIRED)    Answer:   Post menopausal osteoporosis eval    Order Specific Question:   Preferred imaging location?    Answer:   [facility]    Order Specific Question:   Release to patient    Answer:   Immediate   The patient has a good understanding of the overall plan. she agrees with it. she will call with any problems that may develop before the next visit here.  Total time spent: 30 mins including face to face time and time spent for planning, charting and coordination of care  Nicholas Lose, MD 09/01/2019  I, Cloyde Reams Dorshimer, am acting as scribe for Dr. Nicholas Lose.  I have reviewed the above documentation for accuracy and completeness, and I agree with the above.

## 2019-09-01 ENCOUNTER — Telehealth: Payer: Self-pay

## 2019-09-01 ENCOUNTER — Other Ambulatory Visit: Payer: Self-pay

## 2019-09-01 ENCOUNTER — Inpatient Hospital Stay: Payer: Medicare Other | Attending: Hematology and Oncology

## 2019-09-01 ENCOUNTER — Telehealth: Payer: Self-pay | Admitting: Pharmacist

## 2019-09-01 ENCOUNTER — Inpatient Hospital Stay: Payer: Medicare Other | Admitting: Hematology and Oncology

## 2019-09-01 VITALS — BP 119/89 | HR 87 | Temp 97.8°F | Resp 18 | Ht 63.0 in | Wt 144.0 lb

## 2019-09-01 DIAGNOSIS — C50211 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 female breast: Secondary | ICD-10-CM | POA: Diagnosis not present

## 2019-09-01 DIAGNOSIS — Z780 Asymptomatic menopausal state: Secondary | ICD-10-CM | POA: Diagnosis not present

## 2019-09-01 DIAGNOSIS — Z170 Estrogen receptor positive status [ER+]: Secondary | ICD-10-CM

## 2019-09-01 DIAGNOSIS — Z9011 Acquired absence of right breast and nipple: Secondary | ICD-10-CM | POA: Insufficient documentation

## 2019-09-01 DIAGNOSIS — I82C19 Acute embolism and thrombosis of unspecified internal jugular vein: Secondary | ICD-10-CM | POA: Insufficient documentation

## 2019-09-01 DIAGNOSIS — D6481 Anemia due to antineoplastic chemotherapy: Secondary | ICD-10-CM | POA: Insufficient documentation

## 2019-09-01 LAB — CMP (CANCER CENTER ONLY)
ALT: 14 U/L (ref 0–44)
AST: 18 U/L (ref 15–41)
Albumin: 3.8 g/dL (ref 3.5–5.0)
Alkaline Phosphatase: 59 U/L (ref 38–126)
Anion gap: 9 (ref 5–15)
BUN: 24 mg/dL — ABNORMAL HIGH (ref 8–23)
CO2: 24 mmol/L (ref 22–32)
Calcium: 9.1 mg/dL (ref 8.9–10.3)
Chloride: 105 mmol/L (ref 98–111)
Creatinine: 0.76 mg/dL (ref 0.44–1.00)
GFR, Est AFR Am: >60 mL/min (ref >60)
GFR, Estimated: >60 mL/min (ref >60)
Glucose, Bld: 95 mg/dL (ref 70–99)
Potassium: 5.1 mmol/L (ref 3.5–5.1)
Sodium: 138 mmol/L (ref 135–145)
Total Bilirubin: 0.5 mg/dL (ref 0.3–1.2)
Total Protein: 6.6 g/dL (ref 6.5–8.1)

## 2019-09-01 LAB — CBC WITH DIFFERENTIAL (CANCER CENTER ONLY)
Abs Immature Granulocytes: 0.04 10*3/uL (ref 0.00–0.07)
Basophils Absolute: 0 10*3/uL (ref 0.0–0.1)
Basophils Relative: 1 %
Eosinophils Absolute: 0.3 10*3/uL (ref 0.0–0.5)
Eosinophils Relative: 3 %
HCT: 38.4 % (ref 36.0–46.0)
Hemoglobin: 12.8 g/dL (ref 12.0–15.0)
Immature Granulocytes: 1 %
Lymphocytes Relative: 8 %
Lymphs Abs: 0.7 10*3/uL (ref 0.7–4.0)
MCH: 29.4 pg (ref 26.0–34.0)
MCHC: 33.3 g/dL (ref 30.0–36.0)
MCV: 88.3 fL (ref 80.0–100.0)
Monocytes Absolute: 0.7 10*3/uL (ref 0.1–1.0)
Monocytes Relative: 8 %
Neutro Abs: 6.9 10*3/uL (ref 1.7–7.7)
Neutrophils Relative %: 79 %
Platelet Count: 239 10*3/uL (ref 150–400)
RBC: 4.35 MIL/uL (ref 3.87–5.11)
RDW: 14 % (ref 11.5–15.5)
WBC Count: 8.6 10*3/uL (ref 4.0–10.5)
nRBC: 0 % (ref 0.0–0.2)

## 2019-09-01 MED ORDER — ABEMACICLIB 150 MG PO TABS: 150.0000 mg | ORAL_TABLET | Freq: Two times a day (BID) | ORAL | 3 refills | Status: DC

## 2019-09-01 MED ORDER — ABEMACICLIB 200 MG PO TABS: 200.0000 mg | ORAL_TABLET | Freq: Two times a day (BID) | ORAL | 3 refills | Status: DC

## 2019-09-01 NOTE — Telephone Encounter (Signed)
Oral Oncology Patient Advocate Encounter  Received notification from [employer] that prior authorization for Verzenio is required.  PA submitted on CoverMyMeds Key [id number] Status is pending  Oral Oncology Clinic will continue to follow.  [place] Patient [place] Phone [phone] Fax [phone] 09/01/2019 3:51 PM

## 2019-09-01 NOTE — Telephone Encounter (Signed)
Oral Oncology Patient Advocate Encounter  Prior Authorization for Melynda Keller has been approved.    PA# [id number] Effective dates: 09/01/19 through 04/19/20  Patients co-pay is $2898.95  Oral Oncology Clinic will continue to follow.   [place] Patient [place] Phone [phone] Fax [phone] 09/01/2019 4:04 PM

## 2019-09-01 NOTE — Telephone Encounter (Signed)
Oral Oncology Pharmacist Encounter  Received new prescription for Verzenio (abemaciclib) for the treatment of relapsed breast cancer in conjunction with anastrazole, planned duration until disease progression or unacceptable drug toxicity.  CMP from 09/01/19 assessed, no relevant lab abnormalities. Prescription dose and frequency assessed.   Current medication list in Epic reviewed, no DDIs with abemaciclib identified.  Prescription has been e-scribed to the [facility] for benefits analysis and approval.  Oral Oncology Clinic will continue to follow for insurance authorization, copayment issues, initial counseling and start date.  Darl Pikes, PharmD, BCPS, BCOP, CPP Hematology/Oncology Clinical Pharmacist Practitioner [facility] [phone]  09/01/2019 12:48 PM

## 2019-09-01 NOTE — Assessment & Plan Note (Signed)
Right breast biopsy 2:00 6 cm from nipple: Grade 2-3 invasive ductal cancer, ER 90%, PR 2%, Ki-67 80%, HER-2 positive ratio 2.07; right biopsy 2:00 4 cm from nipple: IDC with DCIS, ER 95%, PR 10%, Ki-67 90%, HER-2 negative ratio 1.27 Breast MRI08/30/2017: 3 enhancing masses in the upper inner quadrant right breast spanning an area 3.7 cm (1.7 cm, 1 cm, 1.4 cm) no lymph node enlargement Clinical stage: T2N0 (stage 2A)  Prior treatment summary: 1. Genetic counseling: Normal did not reveal any abnormal mutations 2. Neoadjuvant chemotherapy. [facility] Perjeta 6 cycles followed by Herceptin And Perjetamaintenance completed 10/20/2016) from 12/31/15 to 04/15/16 3. Right mastectomy with reconstruction: 05/19/2016:Right mastectomy: IDC grade 3, 1.3 cm, low-grade DCIS, LVIDS present, margins negative, 0/1 lymph node negative, ypT1CypN0 stage IA; repeat HER-2 negative ratio 1.41 4. Adjuvant antiestrogen therapy With letrozole 2.5 mg daily started March 2018 -------------------------------------------------------------------------------------------------------------------------------------------------- Relapsed breast cancer: Subcutaneous disease: 1.8 cm measured by ultrasound Biopsy revealed grade 2-3 IDC ER 90% PR 0% HER-2 negative Ki-67 40% 11/30/2018 right lumpectomy [name]): IDC, grade 3, 2.1cm, perineural invasion present, carcinoma present focally at the posterior margin and involving skeletal muscle. Oncotype score 52: Greater than 39% risk of distant recurrence with hormone therapy alone, chemo benefit greater than 15%  Recommendation: 1.Surgery to remove the tumor8/03/2019 2.Adjuvant chemotherapy withTaxotere and Cytoxan x6 cycles 12/30/2018 -04/07/2019 3.Adjuvant radiationstarted 05/19/2019-06/29/2019 4.Follow-up adjuvant antiestrogen therapy with anastrozole started 05/26/2019 with abemaciclib to start after  radiation ------------------------------------------------------------------------------------------------------------------------------------ Jugular vein DVT: On anticoagulation Chemotherapy-induced anemia Anastrozole toxicities: None  We once again discussed the risks and benefits of abemaciclib.  Profound radiation dermatitis: Market improvement Treatment plan: Start abemaciclib 200 mg p.o. twice daily.  Counseled her extensively about diarrhea fatigue cytopenias and other toxicities.  Bone density will also be ordered. Breast MRI will be ordered for August 2021.  Return to clinic in 2 weeks to assess tolerability to abemaciclib.

## 2019-09-02 ENCOUNTER — Telehealth: Payer: Self-pay | Admitting: Physician Assistant

## 2019-09-04 ENCOUNTER — Telehealth: Payer: Self-pay | Admitting: Hematology and Oncology

## 2019-09-04 NOTE — Telephone Encounter (Signed)
Scheduled per los, patient has been called and notified. 

## 2019-09-05 MED FILL — VERZENIO 150 MG TAB: 150 | 28 days supply | Qty: 56 | Fill #0

## 2019-09-05 NOTE — Telephone Encounter (Signed)
Oral Chemotherapy Pharmacist Encounter  Patient plan on picking up Verzenio from today 5/18 or tomorrow 5/19. She is currently having issues with diarrhea and wants to hold on starting her Verzenio until that is better.  Patient Education I spoke with patient for overview of new oral chemotherapy medication: Verzenio (abemaciclib) for the treatment of relapsed breast cancer in conjunction with anastrazole, planned duration until disease progression or unacceptable drug toxicity.  Counseled patient on administration, dosing, side effects, monitoring, drug-food interactions, safe handling, storage, and disposal. Patient will take 1 tablet (150 mg total) by mouth 2 (two) times daily.  Side effects include but not limited to: diarrhea, N/V, fatigue, decreased wbc.    Reviewed with patient importance of keeping a medication schedule and plan for any missed doses.  Ms. Fiscal voiced understanding and appreciation. All questions answered. Medication handout placed in the mail.  Provided patient with [facility] [place] [facility] phone number. Patient knows to call the office with questions or concerns. Oral Chemotherapy Navigation Clinic will continue to follow.  Darl Pikes, PharmD, BCPS, BCOP, CPP Hematology/Oncology Clinical Pharmacist Practitioner [facility] [place] [facility] [phone]  09/05/2019 2:09 PM

## 2019-09-08 ENCOUNTER — Encounter (INDEPENDENT_AMBULATORY_CARE_PROVIDER_SITE_OTHER): Payer: Self-pay

## 2019-09-12 ENCOUNTER — Encounter: Payer: Self-pay | Admitting: Hematology and Oncology

## 2019-09-12 ENCOUNTER — Other Ambulatory Visit: Payer: Self-pay

## 2019-09-12 ENCOUNTER — Ambulatory Visit
Admission: RE | Admit: 2019-09-12 | Discharge: 2019-09-12 | Disposition: A | Discharge status: Home / Self Care | Payer: Medicare Other | Source: Ambulatory Visit | Attending: Hematology and Oncology | Admitting: Hematology and Oncology

## 2019-09-12 ENCOUNTER — Telehealth: Payer: Self-pay

## 2019-09-12 ENCOUNTER — Other Ambulatory Visit: Payer: Medicare Other

## 2019-09-12 DIAGNOSIS — Z780 Asymptomatic menopausal state: Secondary | ICD-10-CM | POA: Diagnosis not present

## 2019-09-12 DIAGNOSIS — M810 Age-related osteoporosis without current pathological fracture: Secondary | ICD-10-CM

## 2019-09-12 DIAGNOSIS — M85851 Other specified disorders of bone density and structure, right thigh: Secondary | ICD-10-CM | POA: Diagnosis not present

## 2019-09-12 NOTE — Telephone Encounter (Signed)
Oral Oncology Patient Advocate Encounter  Met patient in [place] to complete an application for [employer] Patient Assistance Program in an effort to reduce the patient's out of pocket expense for Verzenio to $0.    Application completed and faxed to [phone].   [employer] patient assistance phone number for follow up is [phone].   Patient is also to pick up an rx for [employer] at [facility] under a free trial voucher.  This encounter will be updated until final determination.      [place] Patient [place] Phone [phone] Fax [phone] 09/12/2019 3:06 PM

## 2019-09-13 NOTE — Telephone Encounter (Signed)
Patient is approved for Verzenio at no charge from [employer] 09/13/19-04/19/20.  Pharmacy phone [phone]  [place] Patient [place] Phone [phone] Fax [phone] 09/13/2019 9:21 AM

## 2019-09-14 ENCOUNTER — Other Ambulatory Visit: Payer: Medicare Other

## 2019-09-14 ENCOUNTER — Ambulatory Visit: Payer: Medicare Other | Admitting: Hematology and Oncology

## 2019-09-20 ENCOUNTER — Other Ambulatory Visit: Payer: Medicare Other

## 2019-09-20 ENCOUNTER — Ambulatory Visit: Payer: Medicare Other | Admitting: Hematology and Oncology

## 2019-09-21 ENCOUNTER — Other Ambulatory Visit: Payer: Medicare Other

## 2019-09-21 ENCOUNTER — Ambulatory Visit: Payer: Medicare Other | Admitting: Hematology and Oncology

## 2019-09-28 ENCOUNTER — Other Ambulatory Visit: Payer: Self-pay | Admitting: Hematology and Oncology

## 2019-10-01 ENCOUNTER — Encounter: Payer: Self-pay | Admitting: Hematology and Oncology

## 2019-10-02 ENCOUNTER — Telehealth: Payer: Self-pay | Admitting: Hematology and Oncology

## 2019-10-02 NOTE — Telephone Encounter (Signed)
Rescheduled appt per 6/14 sch message - pt is aware of appt date and time

## 2019-10-10 ENCOUNTER — Other Ambulatory Visit: Payer: Medicare Other

## 2019-10-10 ENCOUNTER — Ambulatory Visit: Payer: Medicare Other | Admitting: Hematology and Oncology

## 2019-10-12 ENCOUNTER — Other Ambulatory Visit: Payer: Self-pay | Admitting: Hematology and Oncology

## 2019-10-12 ENCOUNTER — Telehealth: Payer: Self-pay | Admitting: *Deleted

## 2019-10-12 MED ORDER — DICYCLOMINE HCL 10 MG PO CAPS: 10.0000 mg | ORAL_CAPSULE | Freq: Three times a day (TID) | ORAL | 2 refills | Status: DC

## 2019-10-12 NOTE — Telephone Encounter (Signed)
Received call from pt with complaint of diarrhea and abdominal pain.  Pt states she started Verzenio 150 mg BID on Monday 10/09/19. Pt states she is currently taking imodium 1/2 tablet twice a day to help control diarrhea.  Pt states her main complaint is constant abdominal pain/ cramping.  Per MD pt to start taking bentyl 10 mg 4 times a day.  Also per MD if pt abdominal pain and diarrhea continue through the weekend, pt to decrease her dose of Verzenio to 150 mg once a day instead of twice a day.  Pt verbalized understanding and appreciative of the advice.

## 2019-10-15 NOTE — Progress Notes (Signed)
Patient Care Team: [name], MD as PCP - General (Family Medicine) [name], RN as Oncology Nurse Navigator Mauro Kaufmann, RN as Oncology Nurse Navigator  DIAGNOSIS:    ICD-10-CM   1. 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 breast in female, estrogen receptor positive ([place])  C50.211 abemaciclib (VERZENIO) 150 MG tablet   Z17.0 CBC with Differential/Platelet    CMP ([place] only)    SUMMARY OF ONCOLOGIC HISTORY: Oncology History  Breast cancer of upper-inner quadrant of right female breast ([place])  12/05/2015 Mammogram   Right breast mass 2:00 4 cm from nipple: 1.7 cm, T1c N0 stage IA; right breast 2:00 6 cm from nipple 1.3 cm mass, 7 mm satellite nodule between the 2 masses, T1 cN0 stage IA   12/06/2015 Initial Diagnosis   Right breast biopsy 2:00 6 cm from nipple: Grade 2-3 invasive ductal cancer, ER 90%, PR 2%, Ki-67 80%, HER-2 positive ratio 2.07; right biopsy 2:00 4 cm from nipple: IDC with DCIS, ER 95%, PR 10%, Ki-67 90%, HER-2 negative ratio 1.27   12/18/2015 Breast MRI   3 enhancing masses in the upper inner quadrant right breast spanning an area 3.7 cm (1.7 cm, 1 cm, 1.4 cm) no lymph node enlargement   12/31/2015 - 04/15/2016 Neo-Adjuvant Chemotherapy   TCH Perjeta 6 cycles followed by Herceptin, Perjeta maintenance for 1 year completed 10/20/2016   01/08/2016 - 01/12/2016 Hospital Admission   Neutropenic fever hospitalization (because patient did not receive Neulasta with cycle 1)   01/10/2016 Miscellaneous   Genetic testing was normal did not reveal any mutations   04/16/2016 Breast MRI   Near CR to therapy. Previous 2 biopsied massses are no longer seen. Third satellite lesion is smaller from 10 mm to 5.3 mm, no abnormal LN.   05/19/2016 Surgery   Right mastectomy: IDC grade 3, 1.3 cm, low-grade DCIS, LVIDS present, margins negative, 0/1 lymph node negative, ypT1CypN0 stage IA; repeat HER-2 negative ratio 1.41   05/19/2016 Surgery    Right breast reconstruction with tissue expander. Acellular dermis for breast reconstruction (Dr.Thimappa)    06/22/2016 -  Anti-estrogen oral therapy   Letrozole 2.5 mg daily   11/01/2018 Relapse/Recurrence   Patient palpated lump in the UIQ at surgical scar of reconstructed right breast. US of the right breast showed a 1.8cm mass suspicious for recurrent breast cancer. Biopsy showed grade 2 invasive ductal carcinoma, HER-2 negative (1+), ER 90%, PR negative, Ki67 40%.   11/11/2018 Oncotype testing   Oncotype score 52: Greater than 39% risk of distant recurrence with hormone therapy alone, chemo benefit greater than 15%   11/30/2018 Surgery   Right lumpectomy [name]): IDC, grade 3, 2.1cm, perineural invasion present, carcinoma present focally at the posterior margin and involving skeletal muscle.   11/30/2018 Cancer Staging   Staging form: Breast, AJCC 8th Edition - Pathologic stage from 11/30/2018: Stage IIA (pT2, pN0, cM0, G3, ER+, PR-, HER2-) - Signed by Gardenia Phlegm, NP on 12/14/2018   12/23/2018 - 04/07/2019 Chemotherapy   Taxotere and Cytoxan x6 cycles   05/18/2019 - 06/29/2019 Radiation Therapy   Adjuvant radiation   10/09/2019 Miscellaneous   Abemaciclib 300 mg daily started, decrease to 150 mg daily 10/15/2019 due to abdominal pain     CHIEF COMPLIANT: Follow-up of right breast cancer on anastrozole, assess tolerability to abemaciclib   INTERVAL HISTORY: Patricia Chandler is a 64 y.o. with above-mentioned history of recurrent right breast cancerwho underwent a lumpectomy,adjuvant chemotherapy,radiation, and is currently on  anastrozole. Bone density scan on 09/12/19 showed osteopenia with a T-score of -2.5 at the right femur neck. She presents to the clinic today for follow-up and to assess tolerability to abemaciclib.  She started abemaciclib last week and since then she has been having intermittent abdominal pains.  This was quite severe but it was not  accompanied by any diarrhea.  We reduce the dosage to 150 mg daily and the abdominal pain subsided.  ALLERGIES:  has No Known Allergies.  MEDICATIONS:  Current Outpatient Medications  Medication Sig Dispense Refill  . abemaciclib (VERZENIO) 150 MG tablet Take 1 tablet (150 mg total) by mouth daily. Swallow tablets whole. Do not chew, crush, or split tablets before swallowing. 56 tablet 3  . anastrozole (ARIMIDEX) 1 MG tablet Take 1 tablet (1 mg total) by mouth daily. 90 tablet 3  . Biotin 1 MG CAPS Take by mouth.    . calcium-vitamin D (OSCAL WITH D) 500-200 MG-UNIT tablet Take 1 tablet by mouth daily with breakfast.    . denosumab (PROLIA) 60 MG/ML SOLN injection Inject 60 mg into the skin every 6 (six) months. Administer in upper arm, thigh, or abdomen    . dicyclomine (BENTYL) 10 MG capsule Take 1 capsule (10 mg total) by mouth 4 (four) times daily -  before meals and at bedtime. 30 capsule 2  . Lactobacillus (PROBIOTIC ACIDOPHILUS PO) Take 1 capsule by mouth daily.     Marland Kitchen LORazepam (ATIVAN) 0.5 MG tablet Take 1 tablet (0.5 mg total) by mouth at bedtime as needed for sleep. 30 tablet 3  . omeprazole (PRILOSEC) 20 MG capsule Take 20 mg by mouth daily as needed (heartburn).    . zolpidem (AMBIEN) 5 MG tablet TAKE 1 TABLET BY MOUTH EACH NIGHT AT BEDTIME 30 tablet 3   No current facility-administered medications for this visit.    PHYSICAL EXAMINATION: ECOG PERFORMANCE STATUS: 1 - Symptomatic but completely ambulatory  Vitals:   10/16/19 1452  BP: 122/78  Pulse: 89  Resp: 17  Temp: 98.5 F (36.9 C)  SpO2: 100%   Filed Weights   10/16/19 1452  Weight: 142 lb 4.8 oz (64.5 kg)    LABORATORY DATA:  I have reviewed the data as listed CMP Latest Ref Rng & Units 10/16/2019 09/01/2019 04/07/2019  Glucose 70 - 99 mg/dL 100(H) 95 85  BUN 8 - 23 mg/dL 23 24(H) 11  Creatinine 0.44 - 1.00 mg/dL 0.85 0.76 0.68  Sodium 135 - 145 mmol/L 142 138 140  Potassium 3.5 - 5.1 mmol/L 3.7 5.1 3.8    Chloride 98 - 111 mmol/L 106 105 107  CO2 22 - 32 mmol/L '25 24 25  ' Calcium 8.9 - 10.3 mg/dL 8.8(L) 9.1 8.6(L)  Total Protein 6.5 - 8.1 g/dL 6.4(L) 6.6 5.6(L)  Total Bilirubin 0.3 - 1.2 mg/dL 0.4 0.5 0.4  Alkaline Phos 38 - 126 U/L 50 59 51  AST 15 - 41 U/L '15 18 18  ' ALT 0 - 44 U/L '15 14 13    ' Lab Results  Component Value Date   WBC 7.1 10/16/2019   HGB 12.4 10/16/2019   HCT 37.1 10/16/2019   MCV 90.9 10/16/2019   PLT 245 10/16/2019   NEUTROABS 5.8 10/16/2019    ASSESSMENT & PLAN:  Breast cancer of upper-inner quadrant of right female breast (HCC) Right breast biopsy 2:00 6 cm from nipple: Grade 2-3 invasive ductal cancer, ER 90%, PR 2%, Ki-67 80%, HER-2 positive ratio 2.07; right biopsy 2:00 4 cm from nipple:  IDC with DCIS, ER 95%, PR 10%, Ki-67 90%, HER-2 negative ratio 1.27 Breast MRI08/30/2017: 3 enhancing masses in the upper inner quadrant right breast spanning an area 3.7 cm (1.7 cm, 1 cm, 1.4 cm) no lymph node enlargement Clinical stage: T2N0 (stage 2A)  Prior treatment summary: 1. Genetic counseling: Normal did not reveal any abnormal mutations 2. Neoadjuvant chemotherapy. [facility] Perjeta 6 cycles followed by Herceptin And Perjetamaintenance completed 10/20/2016) from 12/31/15 to 04/15/16 3. Right mastectomy with reconstruction: 05/19/2016:Right mastectomy: IDC grade 3, 1.3 cm, low-grade DCIS, LVIDS present, margins negative, 0/1 lymph node negative, ypT1CypN0 stage IA; repeat HER-2 negative ratio 1.41 4. Adjuvant antiestrogen therapy With letrozole 2.5 mg daily started March 2018 -------------------------------------------------------------------------------------------------------------------------------------------------- Relapsed breast cancer: Subcutaneous disease: 1.8 cm measured by ultrasound Biopsy revealed grade 2-3 IDC ER 90% PR 0% HER-2 negative Ki-67 40% 11/30/2018 right lumpectomy [name]): IDC, grade 3, 2.1cm, perineural invasion present, carcinoma present  focally at the posterior margin and involving skeletal muscle. Oncotype score 52: Greater than 39% risk of distant recurrence with hormone therapy alone, chemo benefit greater than 15%  Recommendation: 1.Surgery to remove the tumor8/03/2019 2.Adjuvant chemotherapy withTaxotere and Cytoxan x6 cycles 12/30/2018 -04/07/2019 3.Adjuvant radiationstarted 05/19/2019-06/29/2019 4.Current treatment: Adjuvant antiestrogen therapywith anastrozole started 05/26/2019 with abemaciclib to start after radiation ------------------------------------------------------------------------------------------------------------------------------------ Jugular vein DVT: On anticoagulation Chemotherapy-induced anemia Anastrozole toxicities:None Abemaciclib toxicities: Abdominal pain: Improved with reducing the dosage of abemaciclib to 150 mg daily. We will see her back in a month and if her symptoms still look good then we can increase to 200 twice daily.  However if she continues to have abdominal pains intermittently then we will keep her at 150 mg once daily dosage.  Labs have been reviewed.  Return to clinic in 4 weeks for follow-up    Orders Placed This Encounter  Procedures  . CBC with Differential/Platelet  . CMP ([place] only)    Standing Status:   Future    Standing Expiration Date:   10/15/2020   The patient has a good understanding of the overall plan. she agrees with it. she will call with any problems that may develop before the next visit here.  Total time spent: 30 mins including face to face time and time spent for planning, charting and coordination of care  Nicholas Lose, MD 10/16/2019  I, Cloyde Reams Dorshimer, am acting as scribe for Dr. Nicholas Lose.  I have reviewed the above documentation for accuracy and completeness, and I agree with the above.

## 2019-10-16 ENCOUNTER — Inpatient Hospital Stay (HOSPITAL_BASED_OUTPATIENT_CLINIC_OR_DEPARTMENT_OTHER): Payer: Medicare Other | Admitting: Hematology and Oncology

## 2019-10-16 ENCOUNTER — Other Ambulatory Visit: Payer: Self-pay

## 2019-10-16 ENCOUNTER — Inpatient Hospital Stay: Payer: Medicare Other | Attending: Hematology and Oncology

## 2019-10-16 DIAGNOSIS — Z9221 Personal history of antineoplastic chemotherapy: Secondary | ICD-10-CM | POA: Diagnosis not present

## 2019-10-16 DIAGNOSIS — Z170 Estrogen receptor positive status [ER+]: Secondary | ICD-10-CM

## 2019-10-16 DIAGNOSIS — Z923 Personal history of irradiation: Secondary | ICD-10-CM | POA: Diagnosis not present

## 2019-10-16 DIAGNOSIS — R109 Unspecified abdominal pain: Secondary | ICD-10-CM | POA: Insufficient documentation

## 2019-10-16 DIAGNOSIS — C50211 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 female breast: Secondary | ICD-10-CM | POA: Diagnosis not present

## 2019-10-16 LAB — CMP (CANCER CENTER ONLY)
ALT: 15 U/L (ref 0–44)
AST: 15 U/L (ref 15–41)
Albumin: 3.8 g/dL (ref 3.5–5.0)
Alkaline Phosphatase: 50 U/L (ref 38–126)
Anion gap: 11 (ref 5–15)
BUN: 23 mg/dL (ref 8–23)
CO2: 25 mmol/L (ref 22–32)
Calcium: 8.8 mg/dL — ABNORMAL LOW (ref 8.9–10.3)
Chloride: 106 mmol/L (ref 98–111)
Creatinine: 0.85 mg/dL (ref 0.44–1.00)
GFR, Est AFR Am: >60 mL/min (ref >60)
GFR, Estimated: >60 mL/min (ref >60)
Glucose, Bld: 100 mg/dL — ABNORMAL HIGH (ref 70–99)
Potassium: 3.7 mmol/L (ref 3.5–5.1)
Sodium: 142 mmol/L (ref 135–145)
Total Bilirubin: 0.4 mg/dL (ref 0.3–1.2)
Total Protein: 6.4 g/dL — ABNORMAL LOW (ref 6.5–8.1)

## 2019-10-16 LAB — CBC WITH DIFFERENTIAL (CANCER CENTER ONLY)
Abs Immature Granulocytes: 0.02 10*3/uL (ref 0.00–0.07)
Basophils Absolute: 0 10*3/uL (ref 0.0–0.1)
Basophils Relative: 1 %
Eosinophils Absolute: 0.2 10*3/uL (ref 0.0–0.5)
Eosinophils Relative: 3 %
HCT: 37.1 % (ref 36.0–46.0)
Hemoglobin: 12.4 g/dL (ref 12.0–15.0)
Immature Granulocytes: 0 %
Lymphocytes Relative: 10 %
Lymphs Abs: 0.7 10*3/uL (ref 0.7–4.0)
MCH: 30.4 pg (ref 26.0–34.0)
MCHC: 33.4 g/dL (ref 30.0–36.0)
MCV: 90.9 fL (ref 80.0–100.0)
Monocytes Absolute: 0.3 10*3/uL (ref 0.1–1.0)
Monocytes Relative: 4 %
Neutro Abs: 5.8 10*3/uL (ref 1.7–7.7)
Neutrophils Relative %: 82 %
Platelet Count: 245 10*3/uL (ref 150–400)
RBC: 4.08 MIL/uL (ref 3.87–5.11)
RDW: 13.4 % (ref 11.5–15.5)
WBC Count: 7.1 10*3/uL (ref 4.0–10.5)
nRBC: 0 % (ref 0.0–0.2)

## 2019-10-16 MED ORDER — ABEMACICLIB 150 MG PO TABS: 150.0000 mg | ORAL_TABLET | Freq: Every day | ORAL | 3 refills | Status: DC

## 2019-10-16 NOTE — Assessment & Plan Note (Signed)
Right breast biopsy 2:00 6 cm from nipple: Grade 2-3 invasive ductal cancer, ER 90%, PR 2%, Ki-67 80%, HER-2 positive ratio 2.07; right biopsy 2:00 4 cm from nipple: IDC with DCIS, ER 95%, PR 10%, Ki-67 90%, HER-2 negative ratio 1.27 Breast MRI08/30/2017: 3 enhancing masses in the upper inner quadrant right breast spanning an area 3.7 cm (1.7 cm, 1 cm, 1.4 cm) no lymph node enlargement Clinical stage: T2N0 (stage 2A)  Prior treatment summary: 1. Genetic counseling: Normal did not reveal any abnormal mutations 2. Neoadjuvant chemotherapy. [facility] Perjeta 6 cycles followed by Herceptin And Perjetamaintenance completed 10/20/2016) from 12/31/15 to 04/15/16 3. Right mastectomy with reconstruction: 05/19/2016:Right mastectomy: IDC grade 3, 1.3 cm, low-grade DCIS, LVIDS present, margins negative, 0/1 lymph node negative, ypT1CypN0 stage IA; repeat HER-2 negative ratio 1.41 4. Adjuvant antiestrogen therapy With letrozole 2.5 mg daily started March 2018 -------------------------------------------------------------------------------------------------------------------------------------------------- Relapsed breast cancer: Subcutaneous disease: 1.8 cm measured by ultrasound Biopsy revealed grade 2-3 IDC ER 90% PR 0% HER-2 negative Ki-67 40% 11/30/2018 right lumpectomy Patricia Chandler): IDC, grade 3, 2.1cm, perineural invasion present, carcinoma present focally at the posterior margin and involving skeletal muscle. Oncotype score 52: Greater than 39% risk of distant recurrence with hormone therapy alone, chemo benefit greater than 15%  Recommendation: 1.Surgery to remove the tumor8/03/2019 2.Adjuvant chemotherapy withTaxotere and Cytoxan x6 cycles 12/30/2018 -04/07/2019 3.Adjuvant radiationstarted 05/19/2019-06/29/2019 4.Current treatment: Adjuvant antiestrogen therapywith anastrozole started 05/26/2019 with abemaciclib to start after  radiation ------------------------------------------------------------------------------------------------------------------------------------ Jugular vein DVT: On anticoagulation Chemotherapy-induced anemia Anastrozole toxicities:None Abemaciclib toxicities:  Return to clinic in 2 weeks for follow-up

## 2019-10-20 ENCOUNTER — Encounter (INDEPENDENT_AMBULATORY_CARE_PROVIDER_SITE_OTHER): Payer: Self-pay

## 2019-10-24 ENCOUNTER — Ambulatory Visit: Payer: Medicare Other | Admitting: Hematology and Oncology

## 2019-10-27 ENCOUNTER — Encounter (INDEPENDENT_AMBULATORY_CARE_PROVIDER_SITE_OTHER): Payer: Self-pay

## 2019-11-04 ENCOUNTER — Encounter: Payer: Self-pay | Admitting: Hematology and Oncology

## 2019-11-07 ENCOUNTER — Encounter: Payer: Self-pay | Admitting: *Deleted

## 2019-11-07 ENCOUNTER — Other Ambulatory Visit: Payer: Self-pay | Admitting: *Deleted

## 2019-11-07 DIAGNOSIS — Z170 Estrogen receptor positive status [ER+]: Secondary | ICD-10-CM

## 2019-11-07 DIAGNOSIS — C50211 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 female breast: Secondary | ICD-10-CM

## 2019-11-07 NOTE — Progress Notes (Signed)
Per MD request, referral placed to Dr. Lauretta Chester with [facility] for evaluation and treatment of right breast capsular contracture.

## 2019-11-07 NOTE — Progress Notes (Signed)
Successfully faxed referral to Dr. Lauretta Chester with [facility] at [phone].

## 2019-11-10 ENCOUNTER — Encounter (INDEPENDENT_AMBULATORY_CARE_PROVIDER_SITE_OTHER): Payer: Self-pay

## 2019-11-10 DIAGNOSIS — Z923 Personal history of irradiation: Secondary | ICD-10-CM | POA: Diagnosis not present

## 2019-11-10 DIAGNOSIS — Z853 Personal history of malignant neoplasm of breast: Secondary | ICD-10-CM | POA: Diagnosis not present

## 2019-11-10 DIAGNOSIS — Z9011 Acquired absence of right breast and nipple: Secondary | ICD-10-CM | POA: Diagnosis not present

## 2019-11-12 NOTE — Progress Notes (Signed)
Patient Care Team: [name], MD as PCP - General (Family Medicine) [name], RN as Oncology Nurse Navigator Mauro Kaufmann, RN as Oncology Nurse Navigator  DIAGNOSIS:    ICD-10-CM   1. 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 breast in female, estrogen receptor positive ([place])  C50.211    Z17.0     SUMMARY OF ONCOLOGIC HISTORY: Oncology History  Breast cancer of upper-inner quadrant of right female breast ([place])  12/05/2015 Mammogram   Right breast mass 2:00 4 cm from nipple: 1.7 cm, T1c N0 stage IA; right breast 2:00 6 cm from nipple 1.3 cm mass, 7 mm satellite nodule between the 2 masses, T1 cN0 stage IA   12/06/2015 Initial Diagnosis   Right breast biopsy 2:00 6 cm from nipple: Grade 2-3 invasive ductal cancer, ER 90%, PR 2%, Ki-67 80%, HER-2 positive ratio 2.07; right biopsy 2:00 4 cm from nipple: IDC with DCIS, ER 95%, PR 10%, Ki-67 90%, HER-2 negative ratio 1.27   12/18/2015 Breast MRI   3 enhancing masses in the upper inner quadrant right breast spanning an area 3.7 cm (1.7 cm, 1 cm, 1.4 cm) no lymph node enlargement   12/31/2015 - 04/15/2016 Neo-Adjuvant Chemotherapy   TCH Perjeta 6 cycles followed by Herceptin, Perjeta maintenance for 1 year completed 10/20/2016   01/08/2016 - 01/12/2016 Hospital Admission   Neutropenic fever hospitalization (because patient did not receive Neulasta with cycle 1)   01/10/2016 Miscellaneous   Genetic testing was normal did not reveal any mutations   04/16/2016 Breast MRI   Near CR to therapy. Previous 2 biopsied massses are no longer seen. Third satellite lesion is smaller from 10 mm to 5.3 mm, no abnormal LN.   05/19/2016 Surgery   Right mastectomy: IDC grade 3, 1.3 cm, low-grade DCIS, LVIDS present, margins negative, 0/1 lymph node negative, ypT1CypN0 stage IA; repeat HER-2 negative ratio 1.41   05/19/2016 Surgery   Right breast reconstruction with tissue expander. Acellular dermis for breast reconstruction  (Dr.Thimappa)    06/22/2016 -  Anti-estrogen oral therapy   Letrozole 2.5 mg daily   11/01/2018 Relapse/Recurrence   Patient palpated lump in the UIQ at surgical scar of reconstructed right breast. US of the right breast showed a 1.8cm mass suspicious for recurrent breast cancer. Biopsy showed grade 2 invasive ductal carcinoma, HER-2 negative (1+), ER 90%, PR negative, Ki67 40%.   11/11/2018 Oncotype testing   Oncotype score 52: Greater than 39% risk of distant recurrence with hormone therapy alone, chemo benefit greater than 15%   11/30/2018 Surgery   Right lumpectomy [name]): IDC, grade 3, 2.1cm, perineural invasion present, carcinoma present focally at the posterior margin and involving skeletal muscle.   11/30/2018 Cancer Staging   Staging form: Breast, AJCC 8th Edition - Pathologic stage from 11/30/2018: Stage IIA (pT2, pN0, cM0, G3, ER+, PR-, HER2-) - Signed by Gardenia Phlegm, NP on 12/14/2018   12/23/2018 - 04/07/2019 Chemotherapy   Taxotere and Cytoxan x6 cycles   05/18/2019 - 06/29/2019 Radiation Therapy   Adjuvant radiation   10/09/2019 Miscellaneous   Abemaciclib 300 mg daily started, decrease to 150 mg daily 10/15/2019 due to abdominal pain     CHIEF COMPLIANT: Follow-up of right breast cancer on anastrozole andabemaciclib  INTERVAL HISTORY: Patricia Chandler is a 64 y.o. with above-mentioned history of recurrent right breast cancerwho underwent a lumpectomy,adjuvant chemotherapy,radiation, andis currently on anastrozole andabemaciclib.She presents to the clinic today for follow-up. Since she decrease the dosage of abemaciclib to 150  mg once a day she is doing much better. She no longer has abdominal pain or diarrhea. Her biggest complaint is the tightness in the right breast reconstructed area.  ALLERGIES:  has No Known Allergies.  MEDICATIONS:  Current Outpatient Medications  Medication Sig Dispense Refill  . abemaciclib (VERZENIO) 150 MG tablet  Take 1 tablet (150 mg total) by mouth daily. Swallow tablets whole. Do not chew, crush, or split tablets before swallowing. 56 tablet 3  . anastrozole (ARIMIDEX) 1 MG tablet Take 1 tablet (1 mg total) by mouth daily. 90 tablet 3  . Biotin 1 MG CAPS Take by mouth.    . calcium-vitamin D (OSCAL WITH D) 500-200 MG-UNIT tablet Take 1 tablet by mouth daily with breakfast.    . denosumab (PROLIA) 60 MG/ML SOLN injection Inject 60 mg into the skin every 6 (six) months. Administer in upper arm, thigh, or abdomen    . dicyclomine (BENTYL) 10 MG capsule Take 1 capsule (10 mg total) by mouth 4 (four) times daily -  before meals and at bedtime. 30 capsule 2  . Lactobacillus (PROBIOTIC ACIDOPHILUS PO) Take 1 capsule by mouth daily.     Marland Kitchen LORazepam (ATIVAN) 0.5 MG tablet Take 1 tablet (0.5 mg total) by mouth at bedtime as needed for sleep. 30 tablet 3  . omeprazole (PRILOSEC) 20 MG capsule Take 20 mg by mouth daily as needed (heartburn).    . zolpidem (AMBIEN) 5 MG tablet TAKE 1 TABLET BY MOUTH EACH NIGHT AT BEDTIME 30 tablet 3   No current facility-administered medications for this visit.    PHYSICAL EXAMINATION: ECOG PERFORMANCE STATUS: 1 - Symptomatic but completely ambulatory  Vitals:   11/13/19 1529  BP: 121/72  Pulse: 77  Resp: 18  Temp: 98.3 F (36.8 C)  SpO2: 100%   Filed Weights   11/13/19 1529  Weight: 143 lb 11.2 oz (65.2 kg)    LABORATORY DATA:  I have reviewed the data as listed CMP Latest Ref Rng & Units 10/16/2019 09/01/2019 04/07/2019  Glucose 70 - 99 mg/dL 100(H) 95 85  BUN 8 - 23 mg/dL 23 24(H) 11  Creatinine 0.44 - 1.00 mg/dL 0.85 0.76 0.68  Sodium 135 - 145 mmol/L 142 138 140  Potassium 3.5 - 5.1 mmol/L 3.7 5.1 3.8  Chloride 98 - 111 mmol/L 106 105 107  CO2 22 - 32 mmol/L '25 24 25  ' Calcium 8.9 - 10.3 mg/dL 8.8(L) 9.1 8.6(L)  Total Protein 6.5 - 8.1 g/dL 6.4(L) 6.6 5.6(L)  Total Bilirubin 0.3 - 1.2 mg/dL 0.4 0.5 0.4  Alkaline Phos 38 - 126 U/L 50 59 51  AST 15 - 41 U/L  '15 18 18  ' ALT 0 - 44 U/L '15 14 13    ' Lab Results  Component Value Date   WBC 5.2 11/13/2019   HGB 12.0 11/13/2019   HCT 35.6 (L) 11/13/2019   MCV 90.8 11/13/2019   PLT 248 11/13/2019   NEUTROABS 3.9 11/13/2019    ASSESSMENT & PLAN:  Breast cancer of upper-inner quadrant of right female breast (HCC) Right breast biopsy 2:00 6 cm from nipple: Grade 2-3 invasive ductal cancer, ER 90%, PR 2%, Ki-67 80%, HER-2 positive ratio 2.07; right biopsy 2:00 4 cm from nipple: IDC with DCIS, ER 95%, PR 10%, Ki-67 90%, HER-2 negative ratio 1.27 Breast MRI08/30/2017: 3 enhancing masses in the upper inner quadrant right breast spanning an area 3.7 cm (1.7 cm, 1 cm, 1.4 cm) no lymph node enlargement Clinical stage: T2N0 (stage 2A)  Prior treatment summary: 1. Genetic counseling: Normal did not reveal any abnormal mutations 2. Neoadjuvant chemotherapy. [facility] Perjeta 6 cycles followed by Herceptin And Perjetamaintenance completed 10/20/2016) from 12/31/15 to 04/15/16 3. Right mastectomy with reconstruction: 05/19/2016:Right mastectomy: IDC grade 3, 1.3 cm, low-grade DCIS, LVIDS present, margins negative, 0/1 lymph node negative, ypT1CypN0 stage IA; repeat HER-2 negative ratio 1.41 4. Adjuvant antiestrogen therapy With letrozole 2.5 mg daily started March 2018 -------------------------------------------------------------------------------------------------------------------------------------------------- Relapsed breast cancer: Subcutaneous disease: 1.8 cm measured by ultrasound Biopsy revealed grade 2-3 IDC ER 90% PR 0% HER-2 negative Ki-67 40% 11/30/2018 right lumpectomy [name]): IDC, grade 3, 2.1cm, perineural invasion present, carcinoma present focally at the posterior margin and involving skeletal muscle. Oncotype score 52: Greater than 39% risk of distant recurrence with hormone therapy alone, chemo benefit greater than 15%  Recommendation: 1.Surgery to remove the tumor8/03/2019 2.Adjuvant  chemotherapy withTaxotere and Cytoxan x6 cycles 12/30/2018 -04/07/2019 3.Adjuvant radiationstarted 05/19/2019-06/29/2019 4.Current treatment: Adjuvant antiestrogen therapywith anastrozole started 05/26/2019 with abemaciclib to start after radiation ------------------------------------------------------------------------------------------------------------------------------------ Anastrozole toxicities:None Abemaciclib toxicities: Abdominal pain: Improved with reducing the dosage of abemaciclib to 150 mg daily.  Continue with the current dosage of abemaciclib.  Chest wall/implant discomfort: Patient is seeking a second opinion at [facility]. Return to clinic in 2 months with labs and follow-up    No orders of the defined types were placed in this encounter.  The patient has a good understanding of the overall plan. she agrees with it. she will call with any problems that may develop before the next visit here.  Total time spent: 30 mins including face to face time and time spent for planning, charting and coordination of care  Nicholas Lose, MD 11/13/2019  I, Cloyde Reams Dorshimer, am acting as scribe for Dr. Nicholas Lose.  I have reviewed the above documentation for accuracy and completeness, and I agree with the above.

## 2019-11-13 ENCOUNTER — Telehealth: Payer: Self-pay | Admitting: Hematology and Oncology

## 2019-11-13 ENCOUNTER — Inpatient Hospital Stay: Payer: Medicare Other

## 2019-11-13 ENCOUNTER — Other Ambulatory Visit: Payer: Self-pay

## 2019-11-13 ENCOUNTER — Inpatient Hospital Stay: Payer: Medicare Other | Attending: Hematology and Oncology | Admitting: Hematology and Oncology

## 2019-11-13 DIAGNOSIS — Z170 Estrogen receptor positive status [ER+]: Secondary | ICD-10-CM | POA: Diagnosis not present

## 2019-11-13 DIAGNOSIS — Z9221 Personal history of antineoplastic chemotherapy: Secondary | ICD-10-CM | POA: Diagnosis not present

## 2019-11-13 DIAGNOSIS — Z923 Personal history of irradiation: Secondary | ICD-10-CM | POA: Diagnosis not present

## 2019-11-13 DIAGNOSIS — C50211 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 female breast: Secondary | ICD-10-CM

## 2019-11-13 DIAGNOSIS — R0789 Other chest pain: Secondary | ICD-10-CM | POA: Diagnosis not present

## 2019-11-13 DIAGNOSIS — Z9011 Acquired absence of right breast and nipple: Secondary | ICD-10-CM | POA: Diagnosis not present

## 2019-11-13 LAB — CMP (CANCER CENTER ONLY)
ALT: 11 U/L (ref 0–44)
AST: 15 U/L (ref 15–41)
Albumin: 3.9 g/dL (ref 3.5–5.0)
Alkaline Phosphatase: 48 U/L (ref 38–126)
Anion gap: 10 (ref 5–15)
BUN: 16 mg/dL (ref 8–23)
CO2: 24 mmol/L (ref 22–32)
Calcium: 9.7 mg/dL (ref 8.9–10.3)
Chloride: 107 mmol/L (ref 98–111)
Creatinine: 0.83 mg/dL (ref 0.44–1.00)
GFR, Est AFR Am: >60 mL/min (ref >60)
GFR, Estimated: >60 mL/min (ref >60)
Glucose, Bld: 88 mg/dL (ref 70–99)
Potassium: 4.3 mmol/L (ref 3.5–5.1)
Sodium: 141 mmol/L (ref 135–145)
Total Bilirubin: 0.4 mg/dL (ref 0.3–1.2)
Total Protein: 6.7 g/dL (ref 6.5–8.1)

## 2019-11-13 LAB — CBC WITH DIFFERENTIAL (CANCER CENTER ONLY)
Abs Immature Granulocytes: 0.02 10*3/uL (ref 0.00–0.07)
Basophils Absolute: 0 10*3/uL (ref 0.0–0.1)
Basophils Relative: 1 %
Eosinophils Absolute: 0.1 10*3/uL (ref 0.0–0.5)
Eosinophils Relative: 2 %
HCT: 35.6 % — ABNORMAL LOW (ref 36.0–46.0)
Hemoglobin: 12 g/dL (ref 12.0–15.0)
Immature Granulocytes: 0 %
Lymphocytes Relative: 14 %
Lymphs Abs: 0.7 10*3/uL (ref 0.7–4.0)
MCH: 30.6 pg (ref 26.0–34.0)
MCHC: 33.7 g/dL (ref 30.0–36.0)
MCV: 90.8 fL (ref 80.0–100.0)
Monocytes Absolute: 0.4 10*3/uL (ref 0.1–1.0)
Monocytes Relative: 8 %
Neutro Abs: 3.9 10*3/uL (ref 1.7–7.7)
Neutrophils Relative %: 75 %
Platelet Count: 248 10*3/uL (ref 150–400)
RBC: 3.92 MIL/uL (ref 3.87–5.11)
RDW: 14.7 % (ref 11.5–15.5)
WBC Count: 5.2 10*3/uL (ref 4.0–10.5)
nRBC: 0 % (ref 0.0–0.2)

## 2019-11-13 MED ORDER — ABEMACICLIB 150 MG PO TABS: 150.0000 mg | ORAL_TABLET | Freq: Every day | ORAL | 3 refills | Status: DC

## 2019-11-13 NOTE — Telephone Encounter (Signed)
Scheduled appts per 7/26 los. Pt declined print out of AVS and stated she would refer to mychart.

## 2019-11-13 NOTE — Assessment & Plan Note (Signed)
Right breast biopsy 2:00 6 cm from nipple: Grade 2-3 invasive ductal cancer, ER 90%, PR 2%, Ki-67 80%, HER-2 positive ratio 2.07; right biopsy 2:00 4 cm from nipple: IDC with DCIS, ER 95%, PR 10%, Ki-67 90%, HER-2 negative ratio 1.27 Breast MRI08/30/2017: 3 enhancing masses in the upper inner quadrant right breast spanning an area 3.7 cm (1.7 cm, 1 cm, 1.4 cm) no lymph node enlargement Clinical stage: T2N0 (stage 2A)  Prior treatment summary: 1. Genetic counseling: Normal did not reveal any abnormal mutations 2. Neoadjuvant chemotherapy. ([facility] Perjeta 6 cycles followed by Herceptin And Perjetamaintenance completed 10/20/2016) from 12/31/15 to 04/15/16 3. Right mastectomy with reconstruction: 05/19/2016:Right mastectomy: IDC grade 3, 1.3 cm, low-grade DCIS, LVIDS present, margins negative, 0/1 lymph node negative, ypT1CypN0 stage IA; repeat HER-2 negative ratio 1.41 4. Adjuvant antiestrogen therapy With letrozole 2.5 mg daily started March 2018 -------------------------------------------------------------------------------------------------------------------------------------------------- Relapsed breast cancer: Subcutaneous disease: 1.8 cm measured by ultrasound Biopsy revealed grade 2-3 IDC ER 90% PR 0% HER-2 negative Ki-67 40% 11/30/2018 right lumpectomy ([place]): IDC, grade 3, 2.1cm, perineural invasion present, carcinoma present focally at the posterior margin and involving skeletal muscle. Oncotype score 52: Greater than 39% risk of distant recurrence with hormone therapy alone, chemo benefit greater than 15%  Recommendation: 1.Surgery to remove the tumor8/03/2019 2.Adjuvant chemotherapy withTaxotere and Cytoxan x6 cycles 12/30/2018 -04/07/2019 3.Adjuvant radiationstarted 05/19/2019-06/29/2019 4.Current treatment: Adjuvant antiestrogen therapywith anastrozole started 05/26/2019 with abemaciclib to start after  radiation ------------------------------------------------------------------------------------------------------------------------------------ Anastrozole toxicities:None Abemaciclib toxicities: Abdominal pain: Improved with reducing the dosage of abemaciclib to 150 mg daily. We will see her back in a month and if her symptoms still look good then we can increase to 200 twice daily.  However if she continues to have abdominal pains intermittently then we will keep her at 150 mg once daily dosage.  Return to clinic in 4 weeks with labs and follow-up 

## 2019-11-16 ENCOUNTER — Encounter: Payer: Self-pay | Admitting: Hematology and Oncology

## 2019-11-21 ENCOUNTER — Other Ambulatory Visit: Payer: Self-pay

## 2019-11-21 ENCOUNTER — Ambulatory Visit
Admission: RE | Admit: 2019-11-21 | Discharge: 2019-11-21 | Disposition: A | Discharge status: Home / Self Care | Payer: Medicare Other | Source: Ambulatory Visit | Attending: Hematology and Oncology | Admitting: Hematology and Oncology

## 2019-11-21 DIAGNOSIS — Z170 Estrogen receptor positive status [ER+]: Secondary | ICD-10-CM

## 2019-11-21 DIAGNOSIS — N6489 Other specified disorders of breast: Secondary | ICD-10-CM | POA: Diagnosis not present

## 2019-11-21 DIAGNOSIS — C50211 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 female breast: Secondary | ICD-10-CM

## 2019-11-21 MED ORDER — GADOBUTROL 1 MMOL/ML IV SOLN
7.0000 mL | Freq: Once | INTRAVENOUS | Status: AC | PRN
  Administered 2019-11-21: 7 mL via INTRAVENOUS

## 2019-11-24 ENCOUNTER — Encounter: Payer: Self-pay | Admitting: Hematology and Oncology

## 2019-11-24 ENCOUNTER — Telehealth: Payer: Self-pay | Admitting: Hematology and Oncology

## 2019-11-24 ENCOUNTER — Encounter (INDEPENDENT_AMBULATORY_CARE_PROVIDER_SITE_OTHER): Payer: Self-pay

## 2019-11-24 NOTE — Telephone Encounter (Signed)
Reviewed MRI breast Inflammation of breast. Pocket of fluid.

## 2019-12-01 ENCOUNTER — Encounter (INDEPENDENT_AMBULATORY_CARE_PROVIDER_SITE_OTHER): Payer: Self-pay

## 2019-12-12 ENCOUNTER — Encounter: Payer: Self-pay | Admitting: Hematology and Oncology

## 2019-12-13 ENCOUNTER — Telehealth: Payer: Self-pay | Admitting: Hematology and Oncology

## 2019-12-13 NOTE — Telephone Encounter (Signed)
Called pt per 8/24 sch msg - no answer. Left message for patient to call back

## 2019-12-15 ENCOUNTER — Encounter: Payer: Self-pay | Admitting: Hematology and Oncology

## 2019-12-18 DIAGNOSIS — C50911 Malignant neoplasm of unspecified site of right female breast: Secondary | ICD-10-CM | POA: Diagnosis not present

## 2019-12-18 DIAGNOSIS — Z9011 Acquired absence of right breast and nipple: Secondary | ICD-10-CM | POA: Diagnosis not present

## 2019-12-18 DIAGNOSIS — C50211 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 female breast: Secondary | ICD-10-CM | POA: Diagnosis not present

## 2019-12-18 DIAGNOSIS — Z923 Personal history of irradiation: Secondary | ICD-10-CM | POA: Diagnosis not present

## 2019-12-20 ENCOUNTER — Inpatient Hospital Stay: Payer: Medicare Other | Attending: Hematology and Oncology

## 2019-12-20 ENCOUNTER — Other Ambulatory Visit: Payer: Self-pay

## 2019-12-20 DIAGNOSIS — Z23 Encounter for immunization: Secondary | ICD-10-CM

## 2019-12-20 NOTE — Progress Notes (Signed)
   Covid-19 Vaccination Clinic  Name:  Patricia Chandler    MRN: [id number] DOB: [birth year]  12/20/2019  Patricia Chandler was observed post Covid-19 immunization for 15 minutes without incident. She was provided with Vaccine Information Sheet and instruction to access the V-Safe system.   Patricia Chandler was instructed to call 911 with any severe reactions post vaccine: Marland Kitchen Difficulty breathing  . Swelling of face and throat  . A fast heartbeat  . A bad rash all over body  . Dizziness and weakness

## 2019-12-22 ENCOUNTER — Encounter (INDEPENDENT_AMBULATORY_CARE_PROVIDER_SITE_OTHER): Payer: Self-pay

## 2019-12-29 ENCOUNTER — Encounter (INDEPENDENT_AMBULATORY_CARE_PROVIDER_SITE_OTHER): Payer: Self-pay

## 2020-01-08 ENCOUNTER — Encounter: Payer: Self-pay | Admitting: Family Medicine

## 2020-01-08 ENCOUNTER — Telehealth (INDEPENDENT_AMBULATORY_CARE_PROVIDER_SITE_OTHER): Payer: Medicare Other | Admitting: Family Medicine

## 2020-01-08 DIAGNOSIS — F5101 Primary insomnia: Secondary | ICD-10-CM | POA: Diagnosis not present

## 2020-01-08 DIAGNOSIS — R0683 Snoring: Secondary | ICD-10-CM

## 2020-01-08 DIAGNOSIS — G4700 Insomnia, unspecified: Secondary | ICD-10-CM | POA: Insufficient documentation

## 2020-01-08 MED ORDER — TRAZODONE HCL 50 MG PO TABS: 25.0000 mg | ORAL_TABLET | Freq: Every evening | ORAL | 3 refills | Status: DC | PRN

## 2020-01-08 NOTE — Assessment & Plan Note (Signed)
#   Snoring/Referral for a sleep study #insomnia S: Seeing TMJ specialist Dr. Augustina Mood- jaw issues since chemo starting last September.   She suggested sleep apnea testing due to clenching she is having. She also snores a fair amount and husband has been telling her for a few years she wakes up and seems to gasp (occasional per him). Thinks probably 10-15 years of snoring. 2-3 years of TMj issues  Also on ambien for several years. Had been on ativan with nausea with chemo.  Dr. Toy Cookey had mentioned switching to another medicine.  A little bit more and try to get out of the spells when you are having them A/P: ongoing snoring issues -We will refer to pulmonology for sleep apnea evaluation -TMJ specialist prefers her off ambien and I think that's reasonable. We will trial trazodone and also get OSA testing which could contribute to her symptoms -consider belsomra or hydroxyzine as well (discussed typically would avoid hydroxyzine above 65)

## 2020-01-08 NOTE — Progress Notes (Signed)
Phone [phone] Virtual visit via Video note   Subjective:  Chief complaint: Chief Complaint  Patient presents with  . Referral   This visit type was conducted due to national recommendations for restrictions regarding the COVID-19 Pandemic (e.g. social distancing).  This format is felt to be most appropriate for this patient at this time balancing risks to patient and risks to population by having him in for in person visit.  No physical exam was performed (except for noted visual exam or audio findings with Telehealth visits).    Our team/I connected with Durwin Reges at  2:20 PM EDT by a video enabled telemedicine application (doxy.me or caregility through epic) and verified that I am speaking with the correct person using two identifiers.  Location patient: [place] Location provider: [facility], office Persons participating in the virtual visit:  patient  Our team/I discussed the limitations of evaluation and management by telemedicine and the availability of in person appointments. In light of current covid-19 pandemic, patient also understands that we are trying to protect them by minimizing in office contact if at all possible.  The patient expressed consent for telemedicine visit and agreed to proceed. Patient understands insurance will be billed.   Past Medical History-  Patient Active Problem List   Diagnosis Date Noted  . Port catheter in place 01/08/2016    Priority: High  . Breast cancer of upper-inner quadrant of right female breast ([place]) 12/18/2015    Priority: High  . Acute diverticulitis 06/14/2018    Priority: Medium  . Hypokalemia 03/30/2016    Priority: Medium  . Chemotherapy-induced thrombocytopenia 03/30/2016    Priority: Medium  . GERD (gastroesophageal reflux disease) 01/08/2016    Priority: Medium  . Acute bursitis of right shoulder 06/08/2017    Priority: Low  . Acute shoulder bursitis, left 03/08/2017    Priority: Low  . DDD  (degenerative disc disease), cervical 10/08/2016    Priority: Low  . Genetic testing 01/10/2016    Priority: Low  . History of sepsis 01/08/2016    Priority: Low  . Family history of breast cancer     Priority: Low  . Internal hemorrhoid 01/26/2011    Priority: Low  . Insomnia 01/08/2020  . Liver lesion 06/21/2018  . Intra-abdominal abscess ([place]) 06/14/2018    Medications- reviewed and updated Current Outpatient Medications  Medication Sig Dispense Refill  . abemaciclib (VERZENIO) 150 MG tablet Take 1 tablet (150 mg total) by mouth daily. Swallow tablets whole. Do not chew, crush, or split tablets before swallowing. 30 tablet 3  . anastrozole (ARIMIDEX) 1 MG tablet Take 1 tablet (1 mg total) by mouth daily. 90 tablet 3  . Biotin 1 MG CAPS Take by mouth.    . calcium-vitamin D (OSCAL WITH D) 500-200 MG-UNIT tablet Take 1 tablet by mouth daily with breakfast.    . denosumab (PROLIA) 60 MG/ML SOLN injection Inject 60 mg into the skin every 6 (six) months. Administer in upper arm, thigh, or abdomen    . Lactobacillus (PROBIOTIC ACIDOPHILUS PO) Take 1 capsule by mouth daily.     Marland Kitchen omeprazole (PRILOSEC) 20 MG capsule Take 20 mg by mouth daily as needed (heartburn).    . zolpidem (AMBIEN) 5 MG tablet TAKE 1 TABLET BY MOUTH EACH NIGHT AT BEDTIME 30 tablet 3  . dicyclomine (BENTYL) 10 MG capsule Take 1 capsule (10 mg total) by mouth 4 (four) times daily -  before meals and at bedtime. 30 capsule 2  . traZODone (DESYREL)  50 MG tablet Take 0.5-1 tablets (25-50 mg total) by mouth at bedtime as needed for sleep. 30 tablet 3   No current facility-administered medications for this visit.     Objective:  There were no vitals taken for this visit. self reported vitals Gen: NAD, resting comfortably Lungs: nonlabored, normal respiratory rate  Skin: appears dry, no obvious rash     Assessment and Plan   # Snoring/Referral for a sleep study #insomnia S: Seeing TMJ specialist Dr. Augustina Mood-  jaw issues since chemo starting last September.   She suggested sleep apnea testing due to clenching she is having. She also snores a fair amount and husband has been telling her for a few years she wakes up and seems to gasp (occasional per him). Thinks probably 10-15 years of snoring. 2-3 years of TMj issues  Also on ambien for several years. Had been on ativan with nausea with chemo.  Dr. Toy Cookey had mentioned switching to another medicine.  A little bit more and try to get out of the spells when you are having them A/P: ongoing snoring issues -We will refer to pulmonology for sleep apnea evaluation -TMJ specialist prefers her off ambien and I think that's reasonable. We will trial trazodone and also get OSA testing which could contribute to her symptoms -consider belsomra or hydroxyzine as well (discussed typically would avoid hydroxyzine above 65) - she is going to do ambien 2.5 mg and trazodone 25 mg for a few nights then try full trazodone 50mg . Was honest with her this may be a tough transition.   Recommended follow up: 6 month CPE recommended Future Appointments  Date Time Provider Echo  01/22/2020  3:00 PM [facility] None  01/22/2020  3:30 PM Nicholas Lose, MD [facility] None  01/24/2020 11:00 AM Natividad Brood, LCSW [facility] None    Lab/Order associations:   ICD-10-CM   1. Snoring  R06.83 Ambulatory referral to Pulmonology  2. Primary insomnia  F51.01     Meds ordered this encounter  Medications  . traZODone (DESYREL) 50 MG tablet    Sig: Take 0.5-1 tablets (25-50 mg total) by mouth at bedtime as needed for sleep.    Dispense:  30 tablet    Refill:  3   Return precautions advised.  Garret Reddish, MD

## 2020-01-10 ENCOUNTER — Other Ambulatory Visit: Payer: Self-pay | Admitting: Family Medicine

## 2020-01-10 ENCOUNTER — Encounter: Payer: Self-pay | Admitting: Family Medicine

## 2020-01-11 ENCOUNTER — Encounter: Payer: Self-pay | Admitting: Hematology and Oncology

## 2020-01-12 ENCOUNTER — Encounter (INDEPENDENT_AMBULATORY_CARE_PROVIDER_SITE_OTHER): Payer: Self-pay

## 2020-01-15 ENCOUNTER — Ambulatory Visit: Payer: Medicare Other | Admitting: Hematology and Oncology

## 2020-01-15 ENCOUNTER — Other Ambulatory Visit: Payer: Medicare Other

## 2020-01-19 ENCOUNTER — Encounter (INDEPENDENT_AMBULATORY_CARE_PROVIDER_SITE_OTHER): Payer: Self-pay

## 2020-01-21 NOTE — Progress Notes (Signed)
Patient Care Team: [name], MD as PCP - General (Family Medicine) [name], RN as Oncology Nurse Navigator Mauro Kaufmann, RN as Oncology Nurse Navigator  DIAGNOSIS:    ICD-10-CM   1. Need for prophylactic vaccination and inoculation against influenza  Z23 influenza vaccine adjuvanted (FLUAD) injection 0.5 mL  2. 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 breast in female, estrogen receptor positive ([place])  C50.211 influenza vaccine adjuvanted (FLUAD) injection 0.5 mL   Z17.0     SUMMARY OF ONCOLOGIC HISTORY: Oncology History  Breast cancer of upper-inner quadrant of right female breast ([place])  12/05/2015 Mammogram   Right breast mass 2:00 4 cm from nipple: 1.7 cm, T1c N0 stage IA; right breast 2:00 6 cm from nipple 1.3 cm mass, 7 mm satellite nodule between the 2 masses, T1 cN0 stage IA   12/06/2015 Initial Diagnosis   Right breast biopsy 2:00 6 cm from nipple: Grade 2-3 invasive ductal cancer, ER 90%, PR 2%, Ki-67 80%, HER-2 positive ratio 2.07; right biopsy 2:00 4 cm from nipple: IDC with DCIS, ER 95%, PR 10%, Ki-67 90%, HER-2 negative ratio 1.27   12/18/2015 Breast MRI   3 enhancing masses in the upper inner quadrant right breast spanning an area 3.7 cm (1.7 cm, 1 cm, 1.4 cm) no lymph node enlargement   12/31/2015 - 04/15/2016 Neo-Adjuvant Chemotherapy   [facility] Perjeta 6 cycles followed by Herceptin, Perjeta maintenance for 1 year completed 10/20/2016   01/08/2016 - 01/12/2016 Hospital Admission   Neutropenic fever hospitalization (because patient did not receive Neulasta with cycle 1)   01/10/2016 Miscellaneous   Genetic testing was normal did not reveal any mutations   04/16/2016 Breast MRI   Near CR to therapy. Previous 2 biopsied massses are no longer seen. Third satellite lesion is smaller from 10 mm to 5.3 mm, no abnormal LN.   05/19/2016 Surgery   Right mastectomy: IDC grade 3, 1.3 cm, low-grade DCIS, LVIDS present, margins negative, 0/1 lymph node  negative, ypT1CypN0 stage IA; repeat HER-2 negative ratio 1.41   05/19/2016 Surgery   Right breast reconstruction with tissue expander. Acellular dermis for breast reconstruction (Dr.Thimappa)    06/22/2016 -  Anti-estrogen oral therapy   Letrozole 2.5 mg daily   11/01/2018 Relapse/Recurrence   Patient palpated lump in the UIQ at surgical scar of reconstructed right breast. US of the right breast showed a 1.8cm mass suspicious for recurrent breast cancer. Biopsy showed grade 2 invasive ductal carcinoma, HER-2 negative (1+), ER 90%, PR negative, Ki67 40%.   11/11/2018 Oncotype testing   Oncotype score 52: Greater than 39% risk of distant recurrence with hormone therapy alone, chemo benefit greater than 15%   11/30/2018 Surgery   Right lumpectomy [name]): IDC, grade 3, 2.1cm, perineural invasion present, carcinoma present focally at the posterior margin and involving skeletal muscle.   11/30/2018 Cancer Staging   Staging form: Breast, AJCC 8th Edition - Pathologic stage from 11/30/2018: Stage IIA (pT2, pN0, cM0, G3, ER+, PR-, HER2-) - Signed by Gardenia Phlegm, NP on 12/14/2018   12/23/2018 - 04/07/2019 Chemotherapy   Taxotere and Cytoxan x6 cycles   05/18/2019 - 06/29/2019 Radiation Therapy   Adjuvant radiation   10/09/2019 Miscellaneous   Abemaciclib 300 mg daily started, decrease to 150 mg daily 10/15/2019 due to abdominal pain     CHIEF COMPLIANT: Follow-up of right breast cancer on anastrozole andabemaciclib  INTERVAL HISTORY: Patricia Chandler is a 64 y.o. with above-mentioned history of recurrent right breast cancerwho  underwent a lumpectomy,adjuvant chemotherapy,radiation, andis currently on anastrozole andabemaciclib.Breast MRI on 11/21/19 showed no evidence of malignancy and edema and a small fluid collection in the right breast. She presents to the clinic today for follow-up.  She complains of breast tenderness and discomfort.  She went to see plastic surgery  at [facility] Dr. Hardin Negus.  She is contemplating on doing the D IEP flap.  This may be done in February.  She has not made a final decision yet.  ALLERGIES:  has No Known Allergies.  MEDICATIONS:  Current Outpatient Medications  Medication Sig Dispense Refill  . abemaciclib (VERZENIO) 150 MG tablet Take 1 tablet (150 mg total) by mouth daily. Swallow tablets whole. Do not chew, crush, or split tablets before swallowing. 30 tablet 3  . anastrozole (ARIMIDEX) 1 MG tablet Take 1 tablet (1 mg total) by mouth daily. 90 tablet 3  . Biotin 1 MG CAPS Take by mouth.    . calcium-vitamin D (OSCAL WITH D) 500-200 MG-UNIT tablet Take 1 tablet by mouth daily with breakfast.    . denosumab (PROLIA) 60 MG/ML SOLN injection Inject 60 mg into the skin every 6 (six) months. Administer in upper arm, thigh, or abdomen    . dicyclomine (BENTYL) 10 MG capsule Take 1 capsule (10 mg total) by mouth 4 (four) times daily -  before meals and at bedtime. 30 capsule 2  . Lactobacillus (PROBIOTIC ACIDOPHILUS PO) Take 1 capsule by mouth daily.     Marland Kitchen omeprazole (PRILOSEC) 20 MG capsule Take 20 mg by mouth daily as needed (heartburn).    . zolpidem (AMBIEN) 5 MG tablet TAKE 1 TABLET BY MOUTH EACH NIGHT AT BEDTIME 30 tablet 3   Current Facility-Administered Medications  Medication Dose Route Frequency Provider Last Rate Last Admin  . influenza vaccine adjuvanted (FLUAD) injection 0.5 mL  0.5 mL Intramuscular Once Nicholas Lose, MD        PHYSICAL EXAMINATION: ECOG PERFORMANCE STATUS: 1 - Symptomatic but completely ambulatory  Vitals:   01/22/20 1542  BP: 131/80  Pulse: 72  Resp: 18  Temp: 98.3 F (36.8 C)  SpO2: 98%   Filed Weights   01/22/20 1542  Weight: 151 lb 1.6 oz (68.5 kg)       LABORATORY DATA:  I have reviewed the data as listed CMP Latest Ref Rng & Units 11/13/2019 10/16/2019 09/01/2019  Glucose 70 - 99 mg/dL 88 100(H) 95  BUN 8 - 23 mg/dL 16 23 24(H)  Creatinine 0.44 - 1.00 mg/dL 0.83 0.85 0.76    Sodium 135 - 145 mmol/L 141 142 138  Potassium 3.5 - 5.1 mmol/L 4.3 3.7 5.1  Chloride 98 - 111 mmol/L 107 106 105  CO2 22 - 32 mmol/L '24 25 24  ' Calcium 8.9 - 10.3 mg/dL 9.7 8.8(L) 9.1  Total Protein 6.5 - 8.1 g/dL 6.7 6.4(L) 6.6  Total Bilirubin 0.3 - 1.2 mg/dL 0.4 0.4 0.5  Alkaline Phos 38 - 126 U/L 48 50 59  AST 15 - 41 U/L '15 15 18  ' ALT 0 - 44 U/L '11 15 14    ' Lab Results  Component Value Date   WBC 4.7 01/22/2020   HGB 12.1 01/22/2020   HCT 35.9 (L) 01/22/2020   MCV 93.7 01/22/2020   PLT 228 01/22/2020   NEUTROABS 3.4 01/22/2020    ASSESSMENT & PLAN:  Breast cancer of upper-inner quadrant of right female breast ([facility]) Right breast biopsy 2:00 6 cm from nipple: Grade 2-3 invasive ductal cancer, ER 90%, PR 2%,  Ki-67 80%, HER-2 positive ratio 2.07; right biopsy 2:00 4 cm from nipple: IDC with DCIS, ER 95%, PR 10%, Ki-67 90%, HER-2 negative ratio 1.27 Breast MRI08/30/2017: 3 enhancing masses in the upper inner quadrant right breast spanning an area 3.7 cm (1.7 cm, 1 cm, 1.4 cm) no lymph node enlargement Clinical stage: T2N0 (stage 2A)  Prior treatment summary: 1. Genetic counseling: Normal did not reveal any abnormal mutations 2. Neoadjuvant chemotherapy. [facility] Perjeta 6 cycles followed by Herceptin And Perjetamaintenance completed 10/20/2016) from 12/31/15 to 04/15/16 3. Right mastectomy with reconstruction: 05/19/2016:Right mastectomy: IDC grade 3, 1.3 cm, low-grade DCIS, LVIDS present, margins negative, 0/1 lymph node negative, ypT1CypN0 stage IA; repeat HER-2 negative ratio 1.41 4. Adjuvant antiestrogen therapy With letrozole 2.5 mg daily started March 2018 -------------------------------------------------------------------------------------------------------------------------------------------------- Relapsed breast cancer: Subcutaneous disease: 1.8 cm measured by ultrasound Biopsy revealed grade 2-3 IDC ER 90% PR 0% HER-2 negative Ki-67 40% 11/30/2018 right lumpectomy  [name]): IDC, grade 3, 2.1cm, perineural invasion present, carcinoma present focally at the posterior margin and involving skeletal muscle. Oncotype score 52: Greater than 39% risk of distant recurrence with hormone therapy alone, chemo benefit greater than 15%  Recommendation: 1.Surgery to remove the tumor8/03/2019 2.Adjuvant chemotherapy withTaxotere and Cytoxan x6 cycles 12/30/2018 -04/07/2019 3.Adjuvant radiationstarted 05/19/2019-06/29/2019 4.Current treatment: Adjuvant antiestrogen therapywith anastrozole started 05/26/2019 with abemaciclib to start after radiation (abemaciclib is for 2 years) ------------------------------------------------------------------------------------------------------------------------------------ Anastrozole toxicities:None Abemaciclib toxicities: Abdominal pain: Improved with reducing the dosage of abemaciclib to 150 mg daily.  Continue with the current dosage of abemaciclib. Blood work looks excellent.  Chest wall/implant discomfort: Patient saw plastic surgery at [facility] Dr. Hardin Negus.  She is contemplating on doing the D IEP flap in February 2022. Return to clinic in 3 months with labs and in 6 months with labs and follow-up to see me.  No orders of the defined types were placed in this encounter.  The patient has a good understanding of the overall plan. she agrees with it. she will call with any problems that may develop before the next visit here.  Total time spent: 20 mins including face to face time and time spent for planning, charting and coordination of care  Nicholas Lose, MD 01/22/2020  I, Cloyde Reams Dorshimer, am acting as scribe for Dr. Nicholas Lose.  I have reviewed the above documentation for accuracy and completeness, and I agree with the above.

## 2020-01-22 ENCOUNTER — Inpatient Hospital Stay: Payer: Medicare Other

## 2020-01-22 ENCOUNTER — Other Ambulatory Visit: Payer: Self-pay | Admitting: Hematology and Oncology

## 2020-01-22 ENCOUNTER — Inpatient Hospital Stay: Payer: Medicare Other | Attending: Hematology and Oncology

## 2020-01-22 ENCOUNTER — Inpatient Hospital Stay (HOSPITAL_BASED_OUTPATIENT_CLINIC_OR_DEPARTMENT_OTHER): Payer: Medicare Other | Admitting: Hematology and Oncology

## 2020-01-22 ENCOUNTER — Other Ambulatory Visit: Payer: Self-pay

## 2020-01-22 VITALS — BP 131/80 | HR 72 | Temp 98.3°F | Resp 18 | Ht 63.0 in | Wt 151.1 lb

## 2020-01-22 DIAGNOSIS — Z23 Encounter for immunization: Secondary | ICD-10-CM | POA: Insufficient documentation

## 2020-01-22 DIAGNOSIS — C50211 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 female breast: Secondary | ICD-10-CM

## 2020-01-22 DIAGNOSIS — Z170 Estrogen receptor positive status [ER+]: Secondary | ICD-10-CM

## 2020-01-22 LAB — CMP (CANCER CENTER ONLY)
ALT: 13 U/L (ref 0–44)
AST: 15 U/L (ref 15–41)
Albumin: 3.9 g/dL (ref 3.5–5.0)
Alkaline Phosphatase: 50 U/L (ref 38–126)
Anion gap: 8 (ref 5–15)
BUN: 22 mg/dL (ref 8–23)
CO2: 31 mmol/L (ref 22–32)
Calcium: 10.2 mg/dL (ref 8.9–10.3)
Chloride: 103 mmol/L (ref 98–111)
Creatinine: 0.97 mg/dL (ref 0.44–1.00)
GFR, Est AFR Am: >60 mL/min (ref >60)
GFR, Estimated: >60 mL/min (ref >60)
Glucose, Bld: 93 mg/dL (ref 70–99)
Potassium: 4.4 mmol/L (ref 3.5–5.1)
Sodium: 142 mmol/L (ref 135–145)
Total Bilirubin: 0.4 mg/dL (ref 0.3–1.2)
Total Protein: 6.7 g/dL (ref 6.5–8.1)

## 2020-01-22 LAB — CBC WITH DIFFERENTIAL (CANCER CENTER ONLY)
Abs Immature Granulocytes: 0.03 10*3/uL (ref 0.00–0.07)
Basophils Absolute: 0.1 10*3/uL (ref 0.0–0.1)
Basophils Relative: 1 %
Eosinophils Absolute: 0.2 10*3/uL (ref 0.0–0.5)
Eosinophils Relative: 4 %
HCT: 35.9 % — ABNORMAL LOW (ref 36.0–46.0)
Hemoglobin: 12.1 g/dL (ref 12.0–15.0)
Immature Granulocytes: 1 %
Lymphocytes Relative: 15 %
Lymphs Abs: 0.7 10*3/uL (ref 0.7–4.0)
MCH: 31.6 pg (ref 26.0–34.0)
MCHC: 33.7 g/dL (ref 30.0–36.0)
MCV: 93.7 fL (ref 80.0–100.0)
Monocytes Absolute: 0.4 10*3/uL (ref 0.1–1.0)
Monocytes Relative: 9 %
Neutro Abs: 3.4 10*3/uL (ref 1.7–7.7)
Neutrophils Relative %: 70 %
Platelet Count: 228 10*3/uL (ref 150–400)
RBC: 3.83 MIL/uL — ABNORMAL LOW (ref 3.87–5.11)
RDW: 13.3 % (ref 11.5–15.5)
WBC Count: 4.7 10*3/uL (ref 4.0–10.5)
nRBC: 0 % (ref 0.0–0.2)

## 2020-01-22 MED ORDER — INFLUENZA VAC A&B SA ADJ QUAD 0.5 ML IM PRSY
0.5000 mL | PREFILLED_SYRINGE | Freq: Once | INTRAMUSCULAR | Status: AC
  Administered 2020-01-22: 0.5 mL via INTRAMUSCULAR

## 2020-01-22 MED ORDER — INFLUENZA VAC A&B SA ADJ QUAD 0.5 ML IM PRSY
PREFILLED_SYRINGE | INTRAMUSCULAR | Status: AC
  Filled 2020-01-22: qty 0.5

## 2020-01-22 NOTE — Assessment & Plan Note (Signed)
Right breast biopsy 2:00 6 cm from nipple: Grade 2-3 invasive ductal cancer, ER 90%, PR 2%, Ki-67 80%, HER-2 positive ratio 2.07; right biopsy 2:00 4 cm from nipple: IDC with DCIS, ER 95%, PR 10%, Ki-67 90%, HER-2 negative ratio 1.27 Breast MRI08/30/2017: 3 enhancing masses in the upper inner quadrant right breast spanning an area 3.7 cm (1.7 cm, 1 cm, 1.4 cm) no lymph node enlargement Clinical stage: T2N0 (stage 2A)  Prior treatment summary: 1. Genetic counseling: Normal did not reveal any abnormal mutations 2. Neoadjuvant chemotherapy. [facility] Perjeta 6 cycles followed by Herceptin And Perjetamaintenance completed 10/20/2016) from 12/31/15 to 04/15/16 3. Right mastectomy with reconstruction: 05/19/2016:Right mastectomy: IDC grade 3, 1.3 cm, low-grade DCIS, LVIDS present, margins negative, 0/1 lymph node negative, ypT1CypN0 stage IA; repeat HER-2 negative ratio 1.41 4. Adjuvant antiestrogen therapy With letrozole 2.5 mg daily started March 2018 -------------------------------------------------------------------------------------------------------------------------------------------------- Relapsed breast cancer: Subcutaneous disease: 1.8 cm measured by ultrasound Biopsy revealed grade 2-3 IDC ER 90% PR 0% HER-2 negative Ki-67 40% 11/30/2018 right lumpectomy [name]): IDC, grade 3, 2.1cm, perineural invasion present, carcinoma present focally at the posterior margin and involving skeletal muscle. Oncotype score 52: Greater than 39% risk of distant recurrence with hormone therapy alone, chemo benefit greater than 15%  Recommendation: 1.Surgery to remove the tumor8/03/2019 2.Adjuvant chemotherapy withTaxotere and Cytoxan x6 cycles 12/30/2018 -04/07/2019 3.Adjuvant radiationstarted 05/19/2019-06/29/2019 4.Current treatment: Adjuvant antiestrogen therapywith anastrozole started 05/26/2019 with abemaciclib to start after  radiation ------------------------------------------------------------------------------------------------------------------------------------ Anastrozole toxicities:None Abemaciclib toxicities: Abdominal pain: Improved with reducing the dosage of abemaciclib to 150 mg daily.  Continue with the current dosage of abemaciclib.  Chest wall/implant discomfort: Patient is seeking a second opinion at [facility]. Return to clinic in 2 months with labs and follow-up

## 2020-01-23 ENCOUNTER — Ambulatory Visit (INDEPENDENT_AMBULATORY_CARE_PROVIDER_SITE_OTHER): Payer: Medicare Other | Admitting: Internal Medicine

## 2020-01-23 ENCOUNTER — Encounter: Payer: Self-pay | Admitting: Internal Medicine

## 2020-01-23 ENCOUNTER — Other Ambulatory Visit: Payer: Self-pay

## 2020-01-23 VITALS — BP 106/68 | HR 94 | Temp 97.7°F | Ht 63.0 in | Wt 150.4 lb

## 2020-01-23 DIAGNOSIS — R0683 Snoring: Secondary | ICD-10-CM | POA: Diagnosis not present

## 2020-01-23 DIAGNOSIS — F5101 Primary insomnia: Secondary | ICD-10-CM | POA: Diagnosis not present

## 2020-01-23 DIAGNOSIS — G4733 Obstructive sleep apnea (adult) (pediatric): Secondary | ICD-10-CM

## 2020-01-23 NOTE — Assessment & Plan Note (Signed)
She is entitled to stress and somatic discomfort related insomnia. I agree that continuing ambien for now is reasonable. We can reconsider as work-up proceeds. She knows to discuss OSA with anesthesiologist if she goes to surgery.

## 2020-01-23 NOTE — Progress Notes (Signed)
01/23/20- 64 yoF never smoker for sleep evaluation courtesy of Dr Yong Channel with concern of snoring, Insomnia Medical problem list includes GERD, Diverticulitis, Degenerative Disc Disease, Breast Cancer R/ Chemo XRT/ mastect,  Covid vax- 3 Phizeer Flu vax done Epworth score-5 Body weight today- 150 lbs Using ambien.5 mg, Trazodone caused side effects -----Patient is here for sleep consult. States she snores, wakes up gasping for air, feels "stopped up" when lying down. wakes up 1-2 times a night. Has TMJ. Feels dependent on ambien but not concerned for now.  Nasal congestion lying down- not much pn drip. Perennial. Denies environmental allergy. No ENT surgery.  Helps to prop up some w pillow. Has worked with Dr Augustina Mood, using bruxism guard for clenching and TMJ pain.  Denies EDS. 1 cup AM coffee. Denies heart/ lung problems. Brother with CPAP.  She is considering extensive breast reconstruction surgery at [facility] this winter.  Prior to Admission medications   Medication Sig Start Date End Date Taking? Authorizing Provider  abemaciclib (VERZENIO) 150 MG tablet Take 1 tablet (150 mg total) by mouth daily. Swallow tablets whole. Do not chew, crush, or split tablets before swallowing. 11/13/19  Yes Nicholas Lose, MD  anastrozole (ARIMIDEX) 1 MG tablet Take 1 tablet (1 mg total) by mouth daily. 05/26/19  Yes Nicholas Lose, MD  Biotin 1 MG CAPS Take by mouth.   Yes [provider]  calcium-vitamin D (OSCAL WITH D) 500-200 MG-UNIT tablet Take 1 tablet by mouth daily with breakfast.   Yes [provider]  denosumab (PROLIA) 60 MG/ML SOLN injection Inject 60 mg into the skin every 6 (six) months. Administer in upper arm, thigh, or abdomen   Yes [provider]  Lactobacillus (PROBIOTIC ACIDOPHILUS PO) Take 1 capsule by mouth daily.    Yes [provider]  omeprazole (PRILOSEC) 20 MG capsule Take 20 mg by mouth daily as needed (heartburn).   Yes [provider]   zolpidem (AMBIEN) 5 MG tablet TAKE 1 TABLET BY MOUTH EACH NIGHT AT BEDTIME 09/29/19  Yes Nicholas Lose, MD   Past Medical History:  Diagnosis Date  . Anemia    due to chemo  . Anxiety   . Breast cancer ([place]) dx'd 2017   right  . Cardiomyopathy ([place])    mild, per cardiology note; due to Herceptin  . Dental crowns present   . Family history of adverse reaction to anesthesia    pt's daughter has hx. of post-op nausea  . GERD (gastroesophageal reflux disease)   . Heart murmur   . History of breast cancer 2017   right  . History of chemotherapy    finished chemo 04/15/2016  . Personal history of chemotherapy   . Personal history of radiation therapy    Right Breast Cancer  . Rash 08/04/2016   under right arm - states recent tick bite; to see MD 08/05/2016  . Recurrent breast cancer, right ([place]) dx'd 10/2018  . Runny nose 06/06/2016   clear drainage, per pt.   Past Surgical History:  Procedure Laterality Date  . AUGMENTATION MAMMAPLASTY Left   . BREAST BIOPSY Right 11/30/2018   Procedure: RIGHT BREAST CHEST WALL RECURRENCE EXCISION;  Surgeon: Rolm Bookbinder, MD;  Location: [facility];  Service: General;  Laterality: Right;  . BREAST ENHANCEMENT SURGERY Bilateral 2005  . breast mass removal Right 11/30/2018  . BREAST RECONSTRUCTION WITH PLACEMENT OF TISSUE EXPANDER AND FLEX HD (ACELLULAR HYDRATED DERMIS) Right 05/19/2016   Procedure: RIGHT BREAST RECONSTRUCTION WITH PLACEMENT OF TISSUE  EXPANDER AND  ALLODERM.;  Surgeon: Irene Limbo, MD;  Location: [facility];  Service: Plastics;  Laterality: Right;  . COLONOSCOPY    . COLONOSCOPY WITH PROPOFOL  10/02/2014  . DEBRIDEMENT AND CLOSURE WOUND Right 06/08/2016   Procedure: DEBRIDEMENT RIGHT MASTECTOMY FLAP, TISSUE EXPANSION OF RIGHT CHEST;  Surgeon: Irene Limbo, MD;  Location: [facility];  Service: Plastics;  Laterality: Right;  . MASTECTOMY Right   . MASTOPEXY Left 08/11/2016   Procedure: LEFT BREAST MASTOPEXY;  Surgeon:  Irene Limbo, MD;  Location: [facility];  Service: Plastics;  Laterality: Left;  . NIPPLE SPARING MASTECTOMY/SENTINAL LYMPH NODE BIOPSY/RECONSTRUCTION/PLACEMENT OF TISSUE EXPANDER Right 05/19/2016   Procedure: RIGHT NIPPLE SPARING MASTECTOMY WITH SENTINAL LYMPH NODE BIOPSY WITH BLUE DYE INJECTION;  Surgeon: Rolm Bookbinder, MD;  Location: [facility];  Service: General;  Laterality: Right;  . PORT-A-CATH REMOVAL Right 04/26/2019   Procedure: REMOVAL PORT-A-CATH;  Surgeon: Rolm Bookbinder, MD;  Location: [facility];  Service: General;  Laterality: Right;  . PORTACATH PLACEMENT N/A 12/30/2015   Procedure: INSERTION PORT-A-CATH WITH [place];  Surgeon: Rolm Bookbinder, MD;  Location: [facility];  Service: General;  Laterality: N/A;  . PORTACATH PLACEMENT N/A 11/30/2018   Procedure: INSERTION PORT-A-CATH WITH ULTRASOUND;  Surgeon: Rolm Bookbinder, MD;  Location: [facility];  Service: General;  Laterality: N/A;  . REDUCTION MAMMAPLASTY    . REMOVAL OF TISSUE EXPANDER AND PLACEMENT OF IMPLANT Right 08/11/2016   Procedure: REMOVAL OF RIGHT  TISSUE EXPANDER AND PLACEMENT OF IMPLANT;  Surgeon: Irene Limbo, MD;  Location: [facility];  Service: Plastics;  Laterality: Right;   Family History  Problem Relation Age of Onset  . Breast cancer Maternal Aunt        dx in her 53s  . Stroke Father        late 16s  . Hypertension Father   . Pulmonary fibrosis Mother   . Breast cancer Paternal Aunt 78  . Cancer Paternal Grandmother        abdominal; cervical vs uterine dx in her 96s  . Stomach cancer Maternal Grandmother   . Parkinson's disease Maternal Grandfather   . Anesthesia problems Daughter        post-op nausea  . Colon cancer Neg Hx   . Esophageal cancer Neg Hx   . Rectal cancer Neg Hx    Social History   Socioeconomic History  . Marital status: Married    Spouse name: Not on file  . Number of children: 2  . Years of  education: Not on file  . Highest education level: Not on file  Occupational History  . Occupation: retired  Tobacco Use  . Smoking status: Never Smoker  . Smokeless tobacco: Never Used  Vaping Use  . Vaping Use: Never used  Substance and Sexual Activity  . Alcohol use: Yes    Comment: occasionally  . Drug use: No  . Sexual activity: Not on file  Other Topics Concern  . Not on file  Social History Narrative   Married 1978 (Al). 2 girls 33 and 36 in 2018. [name] lives in [place], [place] with 52 yo son (single right now). and [name] in [place], [place]--> now [place] with husband and 46 year old daughter (born with heart defect with missing valve and initially not closed VSD--still weighs heavily on family- likely will have to be repaired).       Al and her bought [employer] [place]-  prior to breast cancer. Had been doing event design on hold.    Graduated from [employer]. [employer] prior.       Hobbies: gardening, decorating, sewing, enjoys creating   Social Determinants of Health   Financial Resource Strain:   . Difficulty of Paying Living Expenses: Not on file  Food Insecurity:   . Worried About Charity fundraiser in the Last Year: Not on file  . Ran Out of Food in the Last Year: Not on file  Transportation Needs:   . Lack of Transportation (Medical): Not on file  . Lack of Transportation (Non-Medical): Not on file  Physical Activity:   . Days of Exercise per Week: Not on file  . Minutes of Exercise per Session: Not on file  Stress:   . Feeling of Stress : Not on file  Social Connections:   . Frequency of Communication with Friends and Family: Not on file  . Frequency of Social Gatherings with Friends and Family: Not on file  . Attends Religious Services: Not on file  . Active Member of Clubs or Organizations: Not on file  . Attends Archivist Meetings: Not on file  . Marital Status: Not on file  Intimate Partner Violence:   . Fear of  Current or Ex-Partner: Not on file  . Emotionally Abused: Not on file  . Physically Abused: Not on file  . Sexually Abused: Not on file   ROS-see HPI   + = positive Constitutional:    weight loss, night sweats, fevers, chills, fatigue, lassitude. HEENT:    headaches, difficulty swallowing, +tooth/dental problems, sore throat,       sneezing, itching, ear ache, +nasal congestion, post nasal drip, snoring CV:    chest pain, orthopnea, PND, swelling in lower extremities, anasarca,                                  dizziness, palpitations Resp:   shortness of breath with exertion or at rest.                +productive cough,   non-productive cough, coughing up of blood.              change in color of mucus.  wheezing.   Skin:    rash or lesions. GI:  + heartburn, indigestion, abdominal pain, nausea, vomiting, diarrhea,                 change in bowel habits, loss of appetite GU: dysuria, change in color of urine, no urgency or frequency.   flank pain. MS:   joint pain, stiffness, decreased range of motion, back pain. Neuro-     nothing unusual Psych:  change in mood or affect.  depression or +anxiety.   memory loss.  OBJ- Physical Exam General- Alert, Oriented, Affect-appropriate, Distress- none acute Skin- rash-none, lesions- none, excoriation- none Lymphadenopathy- none Head- atraumatic            Eyes- Gross vision intact, PERRLA, conjunctivae and secretions clear            Ears- Hearing, canals-normal            Nose- Clear, no-Septal dev, mucus, polyps, erosion, perforation             Throat- Mallampati IV , mucosa clear , drainage- none, tonsils- atrophic, + teeth Neck- flexible , trachea midline, no stridor , thyroid nl, carotid no  bruit Chest - symmetrical excursion , unlabored           Heart/CV- RRR , no murmur , no gallop  , no rub, nl s1 s2                           - JVD- none , edema- none, stasis changes- none, varices- none           Lung- clear to P&A, wheeze- none,  cough- none , dullness-none, rub- none           Chest wall-  Abd-  Br/ Gen/ Rectal- Not done, not indicated Extrem- cyanosis- none, clubbing, none, atrophy- none, strength- nl Neuro- grossly intact to observation

## 2020-01-23 NOTE — Assessment & Plan Note (Addendum)
Suspected OSA. Discussed medical concerns, testing, treatment options, driving safety and potential aggravation by sedatives/ post-op pain meds.  She has good resource with Dr Toy Cookey. If her AHI isn't too high, a well-fitted oral appliance might do well, if she doesn't chew it up. Otherwise, CPAP and a bruxism guard might be a good strategy. Plan- sleep study.  CC Dr Toy Cookey.

## 2020-01-23 NOTE — Patient Instructions (Signed)
Order- schedule home sleep test  Dx OSA  Please call us about 2 weeks after your sleep test to see if results and recommendations are ready yet. If appropriate, we may be able to start treatment before we see you next.

## 2020-01-24 ENCOUNTER — Ambulatory Visit (INDEPENDENT_AMBULATORY_CARE_PROVIDER_SITE_OTHER): Payer: Medicare Other | Admitting: Psychology

## 2020-01-24 DIAGNOSIS — F064 Anxiety disorder due to known physiological condition: Secondary | ICD-10-CM

## 2020-01-25 ENCOUNTER — Telehealth: Payer: Self-pay | Admitting: Hematology and Oncology

## 2020-01-25 NOTE — Telephone Encounter (Signed)
Scheduled per 10/4 los. Called and spoke with pt, confirmed added appts

## 2020-01-26 ENCOUNTER — Encounter (INDEPENDENT_AMBULATORY_CARE_PROVIDER_SITE_OTHER): Payer: Self-pay

## 2020-01-26 ENCOUNTER — Institutional Professional Consult (permissible substitution): Payer: Medicare Other | Admitting: Internal Medicine

## 2020-01-30 ENCOUNTER — Other Ambulatory Visit: Payer: Self-pay | Admitting: Hematology and Oncology

## 2020-01-31 DIAGNOSIS — L814 Other melanin hyperpigmentation: Secondary | ICD-10-CM | POA: Diagnosis not present

## 2020-01-31 DIAGNOSIS — L821 Other seborrheic keratosis: Secondary | ICD-10-CM | POA: Diagnosis not present

## 2020-01-31 DIAGNOSIS — D225 Melanocytic nevi of trunk: Secondary | ICD-10-CM | POA: Diagnosis not present

## 2020-01-31 DIAGNOSIS — L905 Scar conditions and fibrosis of skin: Secondary | ICD-10-CM | POA: Diagnosis not present

## 2020-01-31 DIAGNOSIS — D1801 Hemangioma of skin and subcutaneous tissue: Secondary | ICD-10-CM | POA: Diagnosis not present

## 2020-02-06 ENCOUNTER — Telehealth: Payer: Self-pay | Admitting: Internal Medicine

## 2020-02-06 NOTE — Telephone Encounter (Signed)
Scheduled pt for 10/28 @ 4:30. Nothing further needed at this time.

## 2020-02-13 DIAGNOSIS — C50211 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 female breast: Secondary | ICD-10-CM | POA: Diagnosis not present

## 2020-02-13 DIAGNOSIS — Z01818 Encounter for other preprocedural examination: Secondary | ICD-10-CM | POA: Diagnosis not present

## 2020-02-13 DIAGNOSIS — Z9011 Acquired absence of right breast and nipple: Secondary | ICD-10-CM | POA: Diagnosis not present

## 2020-02-15 ENCOUNTER — Other Ambulatory Visit: Payer: Self-pay

## 2020-02-15 ENCOUNTER — Ambulatory Visit: Payer: Medicare Other

## 2020-02-15 DIAGNOSIS — G4733 Obstructive sleep apnea (adult) (pediatric): Secondary | ICD-10-CM | POA: Diagnosis not present

## 2020-02-17 ENCOUNTER — Encounter (INDEPENDENT_AMBULATORY_CARE_PROVIDER_SITE_OTHER): Payer: Self-pay

## 2020-02-20 DIAGNOSIS — G4733 Obstructive sleep apnea (adult) (pediatric): Secondary | ICD-10-CM | POA: Diagnosis not present

## 2020-02-21 ENCOUNTER — Ambulatory Visit (INDEPENDENT_AMBULATORY_CARE_PROVIDER_SITE_OTHER): Payer: Medicare Other | Admitting: Psychology

## 2020-02-21 DIAGNOSIS — F064 Anxiety disorder due to known physiological condition: Secondary | ICD-10-CM | POA: Diagnosis not present

## 2020-02-22 ENCOUNTER — Ambulatory Visit: Payer: Medicare Other | Admitting: Psychology

## 2020-02-23 ENCOUNTER — Encounter (INDEPENDENT_AMBULATORY_CARE_PROVIDER_SITE_OTHER): Payer: Self-pay

## 2020-02-23 ENCOUNTER — Telehealth: Payer: Self-pay | Admitting: Internal Medicine

## 2020-02-23 DIAGNOSIS — G4733 Obstructive sleep apnea (adult) (pediatric): Secondary | ICD-10-CM

## 2020-02-23 NOTE — Telephone Encounter (Signed)
Patient is calling about HST results   Dr. Annamaria Boots please advise

## 2020-02-25 NOTE — Telephone Encounter (Signed)
Home Sleep Test showed mild obstructive sleep apnea, averaging 12 apneas/ hour, with drops in blood oxygen level. This in range for her to discuss a fitted oral appliance with her dentist, Dr Augustina Mood. Otherwise we will be able to set her up with CPAP if she wants.

## 2020-02-26 NOTE — Telephone Encounter (Signed)
Spoke to patient and relayed below message.  Patient stated that she will discuss oral appliance with Dr. Toy Cookey.  Referral has been placed to Dr. Toy Cookey.  Nothing further needed.

## 2020-02-29 ENCOUNTER — Telehealth: Payer: Self-pay | Admitting: Internal Medicine

## 2020-02-29 NOTE — Telephone Encounter (Signed)
Spoke with pt who stated she would like sleep study results sent to Dr. Toy Cookey DDS and Dr. Yong Channel PCP. Phone note which provided sleep study results was routed to these doctors. Nothing further needed at this time.

## 2020-03-01 ENCOUNTER — Encounter (INDEPENDENT_AMBULATORY_CARE_PROVIDER_SITE_OTHER): Payer: Self-pay

## 2020-03-06 ENCOUNTER — Ambulatory Visit: Payer: Medicare Other | Admitting: Psychology

## 2020-03-08 ENCOUNTER — Encounter (INDEPENDENT_AMBULATORY_CARE_PROVIDER_SITE_OTHER): Payer: Self-pay

## 2020-03-18 ENCOUNTER — Ambulatory Visit (INDEPENDENT_AMBULATORY_CARE_PROVIDER_SITE_OTHER): Payer: Medicare Other | Admitting: Psychology

## 2020-03-18 DIAGNOSIS — F064 Anxiety disorder due to known physiological condition: Secondary | ICD-10-CM | POA: Diagnosis not present

## 2020-03-22 ENCOUNTER — Encounter (INDEPENDENT_AMBULATORY_CARE_PROVIDER_SITE_OTHER): Payer: Self-pay

## 2020-04-01 ENCOUNTER — Ambulatory Visit (INDEPENDENT_AMBULATORY_CARE_PROVIDER_SITE_OTHER): Payer: Medicare Other | Admitting: Psychology

## 2020-04-01 ENCOUNTER — Other Ambulatory Visit: Payer: Self-pay | Admitting: *Deleted

## 2020-04-01 ENCOUNTER — Encounter: Payer: Self-pay | Admitting: *Deleted

## 2020-04-01 DIAGNOSIS — C50211 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 female breast: Secondary | ICD-10-CM

## 2020-04-01 DIAGNOSIS — F064 Anxiety disorder due to known physiological condition: Secondary | ICD-10-CM | POA: Diagnosis not present

## 2020-04-01 DIAGNOSIS — Z170 Estrogen receptor positive status [ER+]: Secondary | ICD-10-CM

## 2020-04-01 MED ORDER — ABEMACICLIB 150 MG PO TABS: 150.0000 mg | ORAL_TABLET | Freq: Every day | ORAL | 3 refills | Status: DC

## 2020-04-01 NOTE — Progress Notes (Signed)
RN successfully faxed Verzenio prescription to [employer] for manufacture assistance [phone]).

## 2020-04-02 ENCOUNTER — Telehealth: Payer: Self-pay | Admitting: Internal Medicine

## 2020-04-02 NOTE — Telephone Encounter (Signed)
Why don't we let her cancel with [place]. She asked about getting a future home sleep test once she is settled in with her oral appliance. She can call us to set up that follow up when she feels ready.

## 2020-04-02 NOTE — Telephone Encounter (Signed)
04/02/2020  Called and spoke with patient.  She reports that she will not receive the oral appliance until early January/2022 from Dr. Toy Cookey.  She also is having surgery in February/2022.  Plan: Patient to start using oral appliance Patient to contact our office once established with using oral appliance and not having issues and we can consider ordering a home sleep study at that point in time to ensure adequate treatment of obstructive sleep apnea Will route to Dr. Annamaria Boots as Juluis Rainier  Nothing further needed  Wyn Quaker, FNP

## 2020-04-02 NOTE — Telephone Encounter (Signed)
Agreed.  We have already canceled her scheduled follow-up with you in January/2022.  Patient is aware to contact her office when she has successfully started using her oral appliance.  Tentatively in February or March 2022.  And we can order home sleep study at that point in time if needed  Wyn Quaker, FNP

## 2020-04-17 ENCOUNTER — Ambulatory Visit: Payer: Medicare Other | Admitting: Psychology

## 2020-04-21 ENCOUNTER — Encounter: Payer: Self-pay | Admitting: Hematology and Oncology

## 2020-04-22 ENCOUNTER — Other Ambulatory Visit: Payer: Self-pay

## 2020-04-22 DIAGNOSIS — C50211 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 female breast: Secondary | ICD-10-CM

## 2020-04-22 DIAGNOSIS — Z170 Estrogen receptor positive status [ER+]: Secondary | ICD-10-CM

## 2020-04-23 ENCOUNTER — Other Ambulatory Visit: Payer: Self-pay

## 2020-04-23 ENCOUNTER — Inpatient Hospital Stay: Payer: Medicare Other | Attending: Hematology and Oncology

## 2020-04-23 DIAGNOSIS — C50211 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 female breast: Secondary | ICD-10-CM | POA: Diagnosis not present

## 2020-04-23 DIAGNOSIS — Z170 Estrogen receptor positive status [ER+]: Secondary | ICD-10-CM

## 2020-04-23 LAB — CMP (CANCER CENTER ONLY)
ALT: 12 U/L (ref 0–44)
AST: 16 U/L (ref 15–41)
Albumin: 4.2 g/dL (ref 3.5–5.0)
Alkaline Phosphatase: 50 U/L (ref 38–126)
Anion gap: 8 (ref 5–15)
BUN: 19 mg/dL (ref 8–23)
CO2: 25 mmol/L (ref 22–32)
Calcium: 9.9 mg/dL (ref 8.9–10.3)
Chloride: 103 mmol/L (ref 98–111)
Creatinine: 1.01 mg/dL — ABNORMAL HIGH (ref 0.44–1.00)
GFR, Estimated: >60 mL/min (ref >60)
Glucose, Bld: 101 mg/dL — ABNORMAL HIGH (ref 70–99)
Potassium: 3.8 mmol/L (ref 3.5–5.1)
Sodium: 136 mmol/L (ref 135–145)
Total Bilirubin: 0.5 mg/dL (ref 0.3–1.2)
Total Protein: 7.3 g/dL (ref 6.5–8.1)

## 2020-04-23 LAB — CBC WITH DIFFERENTIAL (CANCER CENTER ONLY)
Abs Immature Granulocytes: 0.03 10*3/uL (ref 0.00–0.07)
Basophils Absolute: 0.1 10*3/uL (ref 0.0–0.1)
Basophils Relative: 1 %
Eosinophils Absolute: 0.1 10*3/uL (ref 0.0–0.5)
Eosinophils Relative: 2 %
HCT: 40.5 % (ref 36.0–46.0)
Hemoglobin: 13.7 g/dL (ref 12.0–15.0)
Immature Granulocytes: 1 %
Lymphocytes Relative: 13 %
Lymphs Abs: 0.7 10*3/uL (ref 0.7–4.0)
MCH: 32.4 pg (ref 26.0–34.0)
MCHC: 33.8 g/dL (ref 30.0–36.0)
MCV: 95.7 fL (ref 80.0–100.0)
Monocytes Absolute: 0.4 10*3/uL (ref 0.1–1.0)
Monocytes Relative: 8 %
Neutro Abs: 4 10*3/uL (ref 1.7–7.7)
Neutrophils Relative %: 75 %
Platelet Count: 246 10*3/uL (ref 150–400)
RBC: 4.23 MIL/uL (ref 3.87–5.11)
RDW: 13 % (ref 11.5–15.5)
WBC Count: 5.4 10*3/uL (ref 4.0–10.5)
nRBC: 0 % (ref 0.0–0.2)

## 2020-04-24 ENCOUNTER — Ambulatory Visit: Payer: Medicare Other | Admitting: Internal Medicine

## 2020-04-26 ENCOUNTER — Encounter (INDEPENDENT_AMBULATORY_CARE_PROVIDER_SITE_OTHER): Payer: Self-pay

## 2020-04-29 ENCOUNTER — Telehealth: Payer: Self-pay

## 2020-04-29 NOTE — Telephone Encounter (Signed)
Oral Oncology Patient Advocate Encounter   Was successful in securing patient an $54 grant from Patient [place] [facility]) to provide copayment coverage for [employer].  This will keep the out of pocket expense at $0.     I have spoken with the patient.    The billing information is as follows and has been shared with [place].   Member ID: [id number] Group ID: [id number] RxBin: [id number] Dates of Eligibility: 01/27/20 through 04/25/21  Fund:  [place] Patient [place] Phone [phone] Fax [phone] 04/29/2020 1:11 PM

## 2020-04-29 NOTE — Telephone Encounter (Signed)
Oral Oncology Patient Advocate Encounter   Was successful in securing patient a $6000 grant from [employer] to provide copayment coverage for Verzenio.  This will keep the out of pocket expense at $0.        The billing information is as follows and has been shared with [place].   Member ID: [id number] Group ID: [id number] RxBin: [id number] PCN: [id number] Dates of Eligibility: 04/26/20 through 04/26/21  Fund name:  [employer].   [place] Patient [place] Phone [phone] Fax [phone] 04/29/2020 1:13 PM

## 2020-04-29 NOTE — Telephone Encounter (Signed)
Patient is approved for Verzenio at no charge from [place] 04/20/20-04/19/21.  Lilly uses Psychologist, occupational by [employer]. [place] Patient [place] Phone [phone] Fax [phone] 04/29/2020 8:58 AM

## 2020-04-29 NOTE — Telephone Encounter (Signed)
Oral Oncology Patient Advocate Encounter  Met patient in [facility] Lobby to complete a re-enrollment application for [employer] Patient Assistance Program in an effort to reduce the patient's out of pocket expense for Verzenio to $0.    Application completed and faxed to [phone].   [employer] patient assistance phone number for follow up is [phone].   This encounter will be updated until final determination.      

## 2020-05-03 ENCOUNTER — Encounter (INDEPENDENT_AMBULATORY_CARE_PROVIDER_SITE_OTHER): Payer: Self-pay

## 2020-05-06 ENCOUNTER — Ambulatory Visit (INDEPENDENT_AMBULATORY_CARE_PROVIDER_SITE_OTHER): Payer: Medicare Other | Admitting: Psychology

## 2020-05-06 DIAGNOSIS — F064 Anxiety disorder due to known physiological condition: Secondary | ICD-10-CM

## 2020-05-07 ENCOUNTER — Encounter: Payer: Self-pay | Admitting: Hematology and Oncology

## 2020-05-07 ENCOUNTER — Encounter: Payer: Self-pay | Admitting: Family Medicine

## 2020-05-07 DIAGNOSIS — M810 Age-related osteoporosis without current pathological fracture: Secondary | ICD-10-CM | POA: Insufficient documentation

## 2020-05-10 ENCOUNTER — Encounter: Payer: Self-pay | Admitting: Hematology and Oncology

## 2020-05-10 ENCOUNTER — Encounter (INDEPENDENT_AMBULATORY_CARE_PROVIDER_SITE_OTHER): Payer: Self-pay

## 2020-05-13 DIAGNOSIS — Z01818 Encounter for other preprocedural examination: Secondary | ICD-10-CM | POA: Diagnosis not present

## 2020-05-13 DIAGNOSIS — Z8719 Personal history of other diseases of the digestive system: Secondary | ICD-10-CM | POA: Diagnosis not present

## 2020-05-13 DIAGNOSIS — Z9011 Acquired absence of right breast and nipple: Secondary | ICD-10-CM | POA: Diagnosis not present

## 2020-05-13 DIAGNOSIS — Z923 Personal history of irradiation: Secondary | ICD-10-CM | POA: Diagnosis not present

## 2020-05-13 DIAGNOSIS — C50211 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 female breast: Secondary | ICD-10-CM | POA: Diagnosis not present

## 2020-05-13 DIAGNOSIS — Z86718 Personal history of other venous thrombosis and embolism: Secondary | ICD-10-CM | POA: Diagnosis not present

## 2020-05-13 DIAGNOSIS — K219 Gastro-esophageal reflux disease without esophagitis: Secondary | ICD-10-CM | POA: Diagnosis not present

## 2020-05-14 ENCOUNTER — Other Ambulatory Visit: Payer: Self-pay | Admitting: Hematology and Oncology

## 2020-05-16 ENCOUNTER — Ambulatory Visit (HOSPITAL_COMMUNITY)
Admission: RE | Admit: 2020-05-16 | Discharge: 2020-05-16 | Disposition: A | Discharge status: Home / Self Care | Payer: Medicare Other | Source: Ambulatory Visit | Attending: Hematology and Oncology | Admitting: Hematology and Oncology

## 2020-05-16 ENCOUNTER — Other Ambulatory Visit: Payer: Self-pay

## 2020-05-16 ENCOUNTER — Encounter (HOSPITAL_COMMUNITY): Payer: Self-pay

## 2020-05-16 DIAGNOSIS — Z9882 Breast implant status: Secondary | ICD-10-CM | POA: Diagnosis not present

## 2020-05-16 DIAGNOSIS — Z170 Estrogen receptor positive status [ER+]: Secondary | ICD-10-CM

## 2020-05-16 DIAGNOSIS — R600 Localized edema: Secondary | ICD-10-CM | POA: Diagnosis not present

## 2020-05-16 DIAGNOSIS — C50911 Malignant neoplasm of unspecified site of right female breast: Secondary | ICD-10-CM | POA: Diagnosis not present

## 2020-05-16 DIAGNOSIS — C50211 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 female breast: Secondary | ICD-10-CM

## 2020-05-16 DIAGNOSIS — Z9011 Acquired absence of right breast and nipple: Secondary | ICD-10-CM | POA: Diagnosis not present

## 2020-05-16 MED ORDER — GADOBUTROL 1 MMOL/ML IV SOLN
7.0000 mL | Freq: Once | INTRAVENOUS | Status: AC | PRN
  Administered 2020-05-16: 7 mL via INTRAVENOUS

## 2020-05-16 NOTE — Progress Notes (Signed)
HEMATOLOGY-ONCOLOGY MYCHART VIDEO VISIT PROGRESS NOTE  I connected with Patricia Chandler on 05/17/2020 at 11:15 AM EST by MyChart video conference and verified that I am speaking with the correct person using two identifiers.  I discussed the limitations, risks, security and privacy concerns of performing an evaluation and management service by MyChart and the availability of in person appointments.  I also discussed with the patient that there may be a patient responsible charge related to this service. The patient expressed understanding and agreed to proceed.  Patient's Location: Home Physician Location: Clinic  CHIEF COMPLIANT: Follow-up of right breast cancer on anastrozoleandabemaciclib  INTERVAL HISTORY: Patricia Chandler is a 65 y.o. female with above-mentioned history of recurrent right breast cancerwho underwent a lumpectomy,adjuvant chemotherapy,radiation, andis currently on anastrozoleandabemaciclib.Breast MRI on 05/16/20 showed no evidence of malignancy and stable edema and fluid collection in the right breast. She presents to the clinic todayfor follow-up.  Oncology History  Breast cancer of upper-inner quadrant of right female breast ([facility])  12/05/2015 Mammogram   Right breast mass 2:00 4 cm from nipple: 1.7 cm, T1c N0 stage IA; right breast 2:00 6 cm from nipple 1.3 cm mass, 7 mm satellite nodule between the 2 masses, T1 cN0 stage IA   12/06/2015 Initial Diagnosis   Right breast biopsy 2:00 6 cm from nipple: Grade 2-3 invasive ductal cancer, ER 90%, PR 2%, Ki-67 80%, HER-2 positive ratio 2.07; right biopsy 2:00 4 cm from nipple: IDC with DCIS, ER 95%, PR 10%, Ki-67 90%, HER-2 negative ratio 1.27   12/18/2015 Breast MRI   3 enhancing masses in the upper inner quadrant right breast spanning an area 3.7 cm (1.7 cm, 1 cm, 1.4 cm) no lymph node enlargement   12/31/2015 - 04/15/2016 Neo-Adjuvant Chemotherapy   [facility] Perjeta 6 cycles followed by Herceptin,  Perjeta maintenance for 1 year completed 10/20/2016   01/08/2016 - 01/12/2016 Hospital Admission   Neutropenic fever hospitalization (because patient did not receive Neulasta with cycle 1)   01/10/2016 Miscellaneous   Genetic testing was normal did not reveal any mutations   04/16/2016 Breast MRI   Near CR to therapy. Previous 2 biopsied massses are no longer seen. Third satellite lesion is smaller from 10 mm to 5.3 mm, no abnormal LN.   05/19/2016 Surgery   Right mastectomy: IDC grade 3, 1.3 cm, low-grade DCIS, LVIDS present, margins negative, 0/1 lymph node negative, ypT1CypN0 stage IA; repeat HER-2 negative ratio 1.41   05/19/2016 Surgery   Right breast reconstruction with tissue expander. Acellular dermis for breast reconstruction (Dr.Thimappa)    06/22/2016 -  Anti-estrogen oral therapy   Letrozole 2.5 mg daily   11/01/2018 Relapse/Recurrence   Patient palpated lump in the UIQ at surgical scar of reconstructed right breast. US of the right breast showed a 1.8cm mass suspicious for recurrent breast cancer. Biopsy showed grade 2 invasive ductal carcinoma, HER-2 negative (1+), ER 90%, PR negative, Ki67 40%.   11/11/2018 Oncotype testing   Oncotype score 52: Greater than 39% risk of distant recurrence with hormone therapy alone, chemo benefit greater than 15%   11/30/2018 Surgery   Right lumpectomy [name]): IDC, grade 3, 2.1cm, perineural invasion present, carcinoma present focally at the posterior margin and involving skeletal muscle.   11/30/2018 Cancer Staging   Staging form: Breast, AJCC 8th Edition - Pathologic stage from 11/30/2018: Stage IIA (pT2, pN0, cM0, G3, ER+, PR-, HER2-) - Signed by Gardenia Phlegm, NP on 12/14/2018   12/23/2018 - 04/07/2019 Chemotherapy   Taxotere and  Cytoxan x6 cycles   05/18/2019 - 06/29/2019 Radiation Therapy   Adjuvant radiation   10/09/2019 Miscellaneous   Abemaciclib 300 mg daily started, decrease to 150 mg daily 10/15/2019 due to abdominal  pain     Observations/Objective:  There were no vitals filed for this visit. There is no height or weight on file to calculate BMI.  I have reviewed the data as listed CMP Latest Ref Rng & Units 04/23/2020 01/22/2020 11/13/2019  Glucose 70 - 99 mg/dL 101(H) 93 88  BUN 8 - 23 mg/dL '19 22 16  ' Creatinine 0.44 - 1.00 mg/dL 1.01(H) 0.97 0.83  Sodium 135 - 145 mmol/L 136 142 141  Potassium 3.5 - 5.1 mmol/L 3.8 4.4 4.3  Chloride 98 - 111 mmol/L 103 103 107  CO2 22 - 32 mmol/L '25 31 24  ' Calcium 8.9 - 10.3 mg/dL 9.9 10.2 9.7  Total Protein 6.5 - 8.1 g/dL 7.3 6.7 6.7  Total Bilirubin 0.3 - 1.2 mg/dL 0.5 0.4 0.4  Alkaline Phos 38 - 126 U/L 50 50 48  AST 15 - 41 U/L '16 15 15  ' ALT 0 - 44 U/L '12 13 11    ' Lab Results  Component Value Date   WBC 5.4 04/23/2020   HGB 13.7 04/23/2020   HCT 40.5 04/23/2020   MCV 95.7 04/23/2020   PLT 246 04/23/2020   NEUTROABS 4.0 04/23/2020      Assessment Plan:  Breast cancer of upper-inner quadrant of right female breast ([place]) Right breast biopsy 2:00 6 cm from nipple: Grade 2-3 invasive ductal cancer, ER 90%, PR 2%, Ki-67 80%, HER-2 positive ratio 2.07; right biopsy 2:00 4 cm from nipple: IDC with DCIS, ER 95%, PR 10%, Ki-67 90%, HER-2 negative ratio 1.27 Breast MRI08/30/2017: 3 enhancing masses in the upper inner quadrant right breast spanning an area 3.7 cm (1.7 cm, 1 cm, 1.4 cm) no lymph node enlargement Clinical stage: T2N0 (stage 2A)  Prior treatment summary: 1. Genetic counseling: Normal did not reveal any abnormal mutations 2. Neoadjuvant chemotherapy. [facility] Perjeta 6 cycles followed by Herceptin And Perjetamaintenance completed 10/20/2016) from 12/31/15 to 04/15/16 3. Right mastectomy with reconstruction: 05/19/2016:Right mastectomy: IDC grade 3, 1.3 cm, low-grade DCIS, LVIDS present, margins negative, 0/1 lymph node negative, ypT1CypN0 stage IA; repeat HER-2 negative ratio 1.41 4. Adjuvant antiestrogen therapy With letrozole 2.5 mg daily started  March 2018 -------------------------------------------------------------------------------------------------------------------------------------------------- Relapsed breast cancer: Subcutaneous disease: 1.8 cm measured by ultrasound Biopsy revealed grade 2-3 IDC ER 90% PR 0% HER-2 negative Ki-67 40% 11/30/2018 right lumpectomy [name]): IDC, grade 3, 2.1cm, perineural invasion present, carcinoma present focally at the posterior margin and involving skeletal muscle. Oncotype score 52: Greater than 39% risk of distant recurrence with hormone therapy alone, chemo benefit greater than 15%  Recommendation: 1.Surgery to remove the tumor8/03/2019 2.Adjuvant chemotherapy withTaxotere and Cytoxan x6 cycles 12/30/2018 -04/07/2019 3.Adjuvant radiationstarted 05/19/2019-06/29/2019 4.Current treatment: Adjuvant antiestrogen therapywith anastrozole started 05/26/2019 with abemaciclib to start after radiation (abemaciclib is for 2 years) ------------------------------------------------------------------------------------------------------------------------------------ Anastrozole toxicities:None Abemaciclib toxicities: Abdominal pain: Improved with reducing the dosage of abemaciclib to 150 mg daily.  Continue with the current dosage of abemaciclib. Blood work looks excellent.  Chest wall/implant discomfort: Patient saw plastic surgery at [facility] Dr. Hardin Negus.  She is contemplating on doing the DIEP flap in February 2022.  This may be delayed because of COVID-19 short staffing at [facility].  Breast MRI 05/16/2020: No evidence of malignancy in either breast.  Periimplant edema and fluid collection right breast implant area are stable  This is  causing her significant discomfort and she is hoping that once her surgery is done she is going to have relief from the discomfort.  Return to clinic in 3 months with labs and follow-up to see me.    I discussed the assessment and treatment plan with the  patient. The patient was provided an opportunity to ask questions and all were answered. The patient agreed with the plan and demonstrated an understanding of the instructions. The patient was advised to call back or seek an in-person evaluation if the symptoms worsen or if the condition fails to improve as anticipated.   I provided 20 minutes of face-to-face MyChart video visit time during this encounter.    Rulon Eisenmenger, MD 05/17/2020   I, Molly Dorshimer, am acting as scribe for Nicholas Lose, MD.  I have reviewed the above documentation for accuracy and completeness, and I agree with the above.

## 2020-05-17 ENCOUNTER — Inpatient Hospital Stay (HOSPITAL_BASED_OUTPATIENT_CLINIC_OR_DEPARTMENT_OTHER): Payer: Medicare Other | Admitting: Hematology and Oncology

## 2020-05-17 ENCOUNTER — Telehealth: Payer: Self-pay

## 2020-05-17 ENCOUNTER — Encounter (INDEPENDENT_AMBULATORY_CARE_PROVIDER_SITE_OTHER): Payer: Self-pay

## 2020-05-17 DIAGNOSIS — C50211 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 female breast: Secondary | ICD-10-CM | POA: Diagnosis not present

## 2020-05-17 DIAGNOSIS — Z170 Estrogen receptor positive status [ER+]: Secondary | ICD-10-CM

## 2020-05-17 NOTE — Telephone Encounter (Signed)
Patient called today to see where we are in the Prolia process-she is a new start for Dr. Yong Channel but has had Prolia before-patient reported she is about a year behind in getting Prolia and would like to get it before she has her surgery on 06/12/20-patient would like a call back to discuss this as soon as possible

## 2020-05-17 NOTE — Assessment & Plan Note (Signed)
Right breast biopsy 2:00 6 cm from nipple: Grade 2-3 invasive ductal cancer, ER 90%, PR 2%, Ki-67 80%, HER-2 positive ratio 2.07; right biopsy 2:00 4 cm from nipple: IDC with DCIS, ER 95%, PR 10%, Ki-67 90%, HER-2 negative ratio 1.27 Breast MRI08/30/2017: 3 enhancing masses in the upper inner quadrant right breast spanning an area 3.7 cm (1.7 cm, 1 cm, 1.4 cm) no lymph node enlargement Clinical stage: T2N0 (stage 2A)  Prior treatment summary: 1. Genetic counseling: Normal did not reveal any abnormal mutations 2. Neoadjuvant chemotherapy. [facility] Perjeta 6 cycles followed by Herceptin And Perjetamaintenance completed 10/20/2016) from 12/31/15 to 04/15/16 3. Right mastectomy with reconstruction: 05/19/2016:Right mastectomy: IDC grade 3, 1.3 cm, low-grade DCIS, LVIDS present, margins negative, 0/1 lymph node negative, ypT1CypN0 stage IA; repeat HER-2 negative ratio 1.41 4. Adjuvant antiestrogen therapy With letrozole 2.5 mg daily started March 2018 -------------------------------------------------------------------------------------------------------------------------------------------------- Relapsed breast cancer: Subcutaneous disease: 1.8 cm measured by ultrasound Biopsy revealed grade 2-3 IDC ER 90% PR 0% HER-2 negative Ki-67 40% 11/30/2018 right lumpectomy [name]): IDC, grade 3, 2.1cm, perineural invasion present, carcinoma present focally at the posterior margin and involving skeletal muscle. Oncotype score 52: Greater than 39% risk of distant recurrence with hormone therapy alone, chemo benefit greater than 15%  Recommendation: 1.Surgery to remove the tumor8/03/2019 2.Adjuvant chemotherapy withTaxotere and Cytoxan x6 cycles 12/30/2018 -04/07/2019 3.Adjuvant radiationstarted 05/19/2019-06/29/2019 4.Current treatment: Adjuvant antiestrogen therapywith anastrozole started 05/26/2019 with abemaciclib to start after radiation (abemaciclib is for 2  years) ------------------------------------------------------------------------------------------------------------------------------------ Anastrozole toxicities:None Abemaciclib toxicities: Abdominal pain: Improved with reducing the dosage of abemaciclib to 150 mg daily.  Continue with the current dosage of abemaciclib. Blood work looks excellent.  Chest wall/implant discomfort: Patient saw plastic surgery at [facility] Dr. Hardin Negus.  She is contemplating on doing the D IEP flap in February 2022. Return to clinic in 3 months with labs and in 6 months with labs and follow-up to see me.

## 2020-05-20 ENCOUNTER — Telehealth: Payer: Self-pay | Admitting: Hematology and Oncology

## 2020-05-20 NOTE — Telephone Encounter (Signed)
Appointments already scheduled per 10/4. Called patient no answer. Left message to see if patient wanted to keep original appointment scheduled on 10/4 or if she wanted to reschedule to another date

## 2020-05-20 NOTE — Telephone Encounter (Signed)
I have attempted to enroll patient thru [employer] protal - portal is currently down. I will call customer service to enroll patient

## 2020-05-21 ENCOUNTER — Ambulatory Visit (INDEPENDENT_AMBULATORY_CARE_PROVIDER_SITE_OTHER): Payer: Medicare Other | Admitting: Psychology

## 2020-05-21 DIAGNOSIS — F064 Anxiety disorder due to known physiological condition: Secondary | ICD-10-CM | POA: Diagnosis not present

## 2020-05-22 NOTE — Telephone Encounter (Signed)
Pt called in checking on status. Per Joellen Jersey, told pt that Joellen Jersey had to manually enroll her, due to the portal being down, so this will take a little bit longer. Reassured pt that Joellen Jersey will call her as soon as she has an update. Pt verbalized understanding

## 2020-05-24 ENCOUNTER — Encounter (INDEPENDENT_AMBULATORY_CARE_PROVIDER_SITE_OTHER): Payer: Self-pay

## 2020-05-31 ENCOUNTER — Other Ambulatory Visit: Payer: Self-pay | Admitting: Hematology and Oncology

## 2020-06-03 NOTE — Telephone Encounter (Signed)
Refill request

## 2020-06-04 ENCOUNTER — Ambulatory Visit (INDEPENDENT_AMBULATORY_CARE_PROVIDER_SITE_OTHER): Payer: Medicare Other | Admitting: Psychology

## 2020-06-04 DIAGNOSIS — F064 Anxiety disorder due to known physiological condition: Secondary | ICD-10-CM | POA: Diagnosis not present

## 2020-06-05 NOTE — Telephone Encounter (Signed)
Patent has been scheduled

## 2020-06-06 ENCOUNTER — Other Ambulatory Visit: Payer: Self-pay

## 2020-06-06 ENCOUNTER — Ambulatory Visit (INDEPENDENT_AMBULATORY_CARE_PROVIDER_SITE_OTHER): Payer: Medicare Other

## 2020-06-06 DIAGNOSIS — M810 Age-related osteoporosis without current pathological fracture: Secondary | ICD-10-CM

## 2020-06-06 MED ORDER — DENOSUMAB 60 MG/ML ~~LOC~~ SOSY
60.0000 mg | PREFILLED_SYRINGE | Freq: Once | SUBCUTANEOUS | Status: AC
  Administered 2020-06-06: 60 mg via SUBCUTANEOUS

## 2020-06-06 NOTE — Progress Notes (Signed)
Patricia Chandler 65 yr old female presents to office today for 6 month prolia injection per Garret Reddish, MD. Administered DENOSUMAB 60 mg/mL subcu left arm. Patient tolerated well.

## 2020-06-11 DIAGNOSIS — C50211 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 female breast: Secondary | ICD-10-CM | POA: Diagnosis not present

## 2020-06-11 DIAGNOSIS — Z9882 Breast implant status: Secondary | ICD-10-CM | POA: Diagnosis not present

## 2020-06-11 DIAGNOSIS — Z79811 Long term (current) use of aromatase inhibitors: Secondary | ICD-10-CM | POA: Diagnosis not present

## 2020-06-11 DIAGNOSIS — Z20822 Contact with and (suspected) exposure to covid-19: Secondary | ICD-10-CM | POA: Diagnosis not present

## 2020-06-11 DIAGNOSIS — Z9011 Acquired absence of right breast and nipple: Secondary | ICD-10-CM | POA: Diagnosis not present

## 2020-06-11 DIAGNOSIS — Z923 Personal history of irradiation: Secondary | ICD-10-CM | POA: Diagnosis not present

## 2020-06-11 DIAGNOSIS — R6889 Other general symptoms and signs: Secondary | ICD-10-CM | POA: Diagnosis not present

## 2020-06-11 DIAGNOSIS — Z421 Encounter for breast reconstruction following mastectomy: Secondary | ICD-10-CM | POA: Diagnosis not present

## 2020-06-11 DIAGNOSIS — Z853 Personal history of malignant neoplasm of breast: Secondary | ICD-10-CM | POA: Diagnosis not present

## 2020-06-18 DIAGNOSIS — Z9011 Acquired absence of right breast and nipple: Secondary | ICD-10-CM | POA: Diagnosis not present

## 2020-06-18 DIAGNOSIS — Z9889 Other specified postprocedural states: Secondary | ICD-10-CM | POA: Diagnosis not present

## 2020-06-18 DIAGNOSIS — C50211 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 female breast: Secondary | ICD-10-CM | POA: Diagnosis not present

## 2020-06-20 ENCOUNTER — Ambulatory Visit: Payer: Medicare Other | Admitting: Psychology

## 2020-06-25 DIAGNOSIS — Z9889 Other specified postprocedural states: Secondary | ICD-10-CM | POA: Diagnosis not present

## 2020-06-25 DIAGNOSIS — C50211 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 female breast: Secondary | ICD-10-CM | POA: Diagnosis not present

## 2020-06-25 DIAGNOSIS — Z9011 Acquired absence of right breast and nipple: Secondary | ICD-10-CM | POA: Diagnosis not present

## 2020-07-02 DIAGNOSIS — Z9011 Acquired absence of right breast and nipple: Secondary | ICD-10-CM | POA: Diagnosis not present

## 2020-07-02 DIAGNOSIS — C50211 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 female breast: Secondary | ICD-10-CM | POA: Diagnosis not present

## 2020-07-02 DIAGNOSIS — Z923 Personal history of irradiation: Secondary | ICD-10-CM | POA: Diagnosis not present

## 2020-07-02 DIAGNOSIS — Z9889 Other specified postprocedural states: Secondary | ICD-10-CM | POA: Diagnosis not present

## 2020-07-10 ENCOUNTER — Encounter: Payer: Self-pay | Admitting: Hematology and Oncology

## 2020-07-22 ENCOUNTER — Other Ambulatory Visit: Payer: Medicare Other

## 2020-07-22 ENCOUNTER — Ambulatory Visit: Payer: Medicare Other | Admitting: Hematology and Oncology

## 2020-07-30 ENCOUNTER — Ambulatory Visit: Payer: Medicare Other | Admitting: Psychology

## 2020-07-30 DIAGNOSIS — Z9889 Other specified postprocedural states: Secondary | ICD-10-CM | POA: Diagnosis not present

## 2020-07-30 DIAGNOSIS — C50211 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 female breast: Secondary | ICD-10-CM | POA: Diagnosis not present

## 2020-07-30 DIAGNOSIS — Z9011 Acquired absence of right breast and nipple: Secondary | ICD-10-CM | POA: Diagnosis not present

## 2020-07-31 ENCOUNTER — Ambulatory Visit (INDEPENDENT_AMBULATORY_CARE_PROVIDER_SITE_OTHER): Payer: Medicare Other | Admitting: Psychology

## 2020-07-31 DIAGNOSIS — F064 Anxiety disorder due to known physiological condition: Secondary | ICD-10-CM | POA: Diagnosis not present

## 2020-08-01 ENCOUNTER — Other Ambulatory Visit: Payer: Self-pay | Admitting: Hematology and Oncology

## 2020-08-13 ENCOUNTER — Ambulatory Visit (INDEPENDENT_AMBULATORY_CARE_PROVIDER_SITE_OTHER): Payer: Medicare Other | Admitting: Psychology

## 2020-08-13 DIAGNOSIS — F064 Anxiety disorder due to known physiological condition: Secondary | ICD-10-CM

## 2020-08-18 NOTE — Assessment & Plan Note (Signed)
Right breast biopsy 2:00 6 cm from nipple: Grade 2-3 invasive ductal cancer, ER 90%, PR 2%, Ki-67 80%, HER-2 positive ratio 2.07; right biopsy 2:00 4 cm from nipple: IDC with DCIS, ER 95%, PR 10%, Ki-67 90%, HER-2 negative ratio 1.27 Breast MRI08/30/2017: 3 enhancing masses in the upper inner quadrant right breast spanning an area 3.7 cm (1.7 cm, 1 cm, 1.4 cm) no lymph node enlargement Clinical stage: T2N0 (stage 2A)  Prior treatment summary: 1. Genetic counseling: Normal did not reveal any abnormal mutations 2. Neoadjuvant chemotherapy. [facility] Perjeta 6 cycles followed by Herceptin And Perjetamaintenance completed 10/20/2016) from 12/31/15 to 04/15/16 3. Right mastectomy with reconstruction: 05/19/2016:Right mastectomy: IDC grade 3, 1.3 cm, low-grade DCIS, LVIDS present, margins negative, 0/1 lymph node negative, ypT1CypN0 stage IA; repeat HER-2 negative ratio 1.41 4. Adjuvant antiestrogen therapy With letrozole 2.5 mg daily started March 2018 -------------------------------------------------------------------------------------------------------------------------------------------------- Relapsed breast cancer: Subcutaneous disease: 1.8 cm measured by ultrasound Biopsy revealed grade 2-3 IDC ER 90% PR 0% HER-2 negative Ki-67 40% 11/30/2018 right lumpectomy [name]): IDC, grade 3, 2.1cm, perineural invasion present, carcinoma present focally at the posterior margin and involving skeletal muscle. Oncotype score 52: Greater than 39% risk of distant recurrence with hormone therapy alone, chemo benefit greater than 15%  Recommendation: 1.Surgery to remove the tumor8/03/2019 2.Adjuvant chemotherapy withTaxotere and Cytoxan x6 cycles 12/30/2018 -04/07/2019 3.Adjuvant radiationstarted 05/19/2019-06/29/2019 4.Current treatment: Adjuvant antiestrogen therapywith anastrozole started 05/26/2019 with abemaciclib to start after radiation(abemaciclib is for 2  years) ------------------------------------------------------------------------------------------------------------------------------------ Anastrozole toxicities:None Abemaciclib toxicities: Abdominal pain: Improved with reducing the dosage of abemaciclib to 150 mg daily.  Continue with the current dosage of abemaciclib. Blood work looks excellent.  Chest wall/implant discomfort:Patient saw plastic surgery at [facility] Dr. Hardin Negus. She is contemplating on doing the DIEP flap in February 2022.  This may be delayed because of COVID-19 short staffing at [facility].  Breast MRI 05/16/2020: No evidence of malignancy in either breast.  Periimplant edema and fluid collection right breast implant area are stable  This is causing her significant discomfort and she is hoping that once her surgery is done she is going to have relief from the discomfort.

## 2020-08-18 NOTE — Progress Notes (Signed)
Patient Care Team: [name], MD as PCP - General (Family Medicine) [name], RN as Oncology Nurse Navigator Mauro Kaufmann, RN as Oncology Nurse Navigator  DIAGNOSIS:    ICD-10-CM   1. 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 breast in female, estrogen receptor positive ([place])  C50.211 abemaciclib (VERZENIO) 100 MG tablet   Z17.0 CBC with Differential ([place] Only)    CMP ([place] only)    MM DIAG BREAST TOMO UNI LEFT    SUMMARY OF ONCOLOGIC HISTORY: Oncology History  Breast cancer of upper-inner quadrant of right female breast ([place])  12/05/2015 Mammogram   Right breast mass 2:00 4 cm from nipple: 1.7 cm, T1c N0 stage IA; right breast 2:00 6 cm from nipple 1.3 cm mass, 7 mm satellite nodule between the 2 masses, T1 cN0 stage IA   12/06/2015 Initial Diagnosis   Right breast biopsy 2:00 6 cm from nipple: Grade 2-3 invasive ductal cancer, ER 90%, PR 2%, Ki-67 80%, HER-2 positive ratio 2.07; right biopsy 2:00 4 cm from nipple: IDC with DCIS, ER 95%, PR 10%, Ki-67 90%, HER-2 negative ratio 1.27   12/18/2015 Breast MRI   3 enhancing masses in the upper inner quadrant right breast spanning an area 3.7 cm (1.7 cm, 1 cm, 1.4 cm) no lymph node enlargement   12/31/2015 - 04/15/2016 Neo-Adjuvant Chemotherapy   [facility] Perjeta 6 cycles followed by Herceptin, Perjeta maintenance for 1 year completed 10/20/2016   01/08/2016 - 01/12/2016 Hospital Admission   Neutropenic fever hospitalization (because patient did not receive Neulasta with cycle 1)   01/10/2016 Miscellaneous   Genetic testing was normal did not reveal any mutations   04/16/2016 Breast MRI   Near CR to therapy. Previous 2 biopsied massses are no longer seen. Third satellite lesion is smaller from 10 mm to 5.3 mm, no abnormal LN.   05/19/2016 Surgery   Right mastectomy: IDC grade 3, 1.3 cm, low-grade DCIS, LVIDS present, margins negative, 0/1 lymph node negative, ypT1CypN0 stage IA; repeat HER-2  negative ratio 1.41   05/19/2016 Surgery   Right breast reconstruction with tissue expander. Acellular dermis for breast reconstruction (Dr.Thimappa)    06/22/2016 -  Anti-estrogen oral therapy   Letrozole 2.5 mg daily   11/01/2018 Relapse/Recurrence   Patient palpated lump in the UIQ at surgical scar of reconstructed right breast. US of the right breast showed a 1.8cm mass suspicious for recurrent breast cancer. Biopsy showed grade 2 invasive ductal carcinoma, HER-2 negative (1+), ER 90%, PR negative, Ki67 40%.   11/11/2018 Oncotype testing   Oncotype score 52: Greater than 39% risk of distant recurrence with hormone therapy alone, chemo benefit greater than 15%   11/30/2018 Surgery   Right lumpectomy [name]): IDC, grade 3, 2.1cm, perineural invasion present, carcinoma present focally at the posterior margin and involving skeletal muscle.   11/30/2018 Cancer Staging   Staging form: Breast, AJCC 8th Edition - Pathologic stage from 11/30/2018: Stage IIA (pT2, pN0, cM0, G3, ER+, PR-, HER2-) - Signed by Gardenia Phlegm, NP on 12/14/2018   12/23/2018 - 04/07/2019 Chemotherapy   Taxotere and Cytoxan x6 cycles   05/18/2019 - 06/29/2019 Radiation Therapy   Adjuvant radiation   10/09/2019 Miscellaneous   Abemaciclib 300 mg daily started, decrease to 150 mg daily 10/15/2019 due to abdominal pain     CHIEF COMPLIANT: Follow-up of right breast cancer on anastrozoleandabemaciclib  INTERVAL HISTORY: Patricia Chandler is a 65 y.o. with above-mentioned history of recurrent right breast cancerwho underwent  a lumpectomy,adjuvant chemotherapy,radiation, andis currently on anastrozoleandabemaciclib.She presents to the clinic todayfor follow-up.  She completed breast reconstruction surgery with D IEP flap at [facility].  She has recovered from it.  She thinks she might need 1 more surgery for few areas that need to be worked on.  She is very happy with the result.  She is no longer in  pain. She is tolerating Verzenio extremely well.  She resumed it after she had surgery.  Denies any abdominal pain.  ALLERGIES:  has No Known Allergies.  MEDICATIONS:  Current Outpatient Medications  Medication Sig Dispense Refill  . abemaciclib (VERZENIO) 100 MG tablet Take 1 tablet (100 mg total) by mouth 2 (two) times daily. Swallow tablets whole. Do not chew, crush, or split tablets before swallowing. 60 tablet 6  . anastrozole (ARIMIDEX) 1 MG tablet TAKE 1 TABLET BY MOUTH DAILY 90 tablet 0  . Biotin 1 MG CAPS Take by mouth.    . calcium-vitamin D (OSCAL WITH D) 500-200 MG-UNIT tablet Take 1 tablet by mouth daily with breakfast.    . denosumab (PROLIA) 60 MG/ML SOLN injection Inject 60 mg into the skin every 6 (six) months. Administer in upper arm, thigh, or abdomen    . Lactobacillus (PROBIOTIC ACIDOPHILUS PO) Take 1 capsule by mouth daily.     Marland Kitchen omeprazole (PRILOSEC) 20 MG capsule Take 20 mg by mouth daily as needed (heartburn).    . zolpidem (AMBIEN) 5 MG tablet TAKE 1 TABLET BY MOUTH EACH NIGHT AT BEDTIME 30 tablet 3   No current facility-administered medications for this visit.    PHYSICAL EXAMINATION: ECOG PERFORMANCE STATUS: 1 - Symptomatic but completely ambulatory  Vitals:   08/19/20 1119  BP: 117/75  Pulse: 81  Resp: 17  Temp: 97.6 F (36.4 C)  SpO2: 99%   Filed Weights   08/19/20 1119  Weight: 155 lb 1.6 oz (70.4 kg)     LABORATORY DATA:  I have reviewed the data as listed CMP Latest Ref Rng & Units 08/19/2020 04/23/2020 01/22/2020  Glucose 70 - 99 mg/dL 69(L) 101(H) 93  BUN 8 - 23 mg/dL 24(H) 19 22  Creatinine 0.44 - 1.00 mg/dL 0.82 1.01(H) 0.97  Sodium 135 - 145 mmol/L 140 136 142  Potassium 3.5 - 5.1 mmol/L 3.9 3.8 4.4  Chloride 98 - 111 mmol/L 107 103 103  CO2 22 - 32 mmol/L '25 25 31  ' Calcium 8.9 - 10.3 mg/dL 9.0 9.9 10.2  Total Protein 6.5 - 8.1 g/dL 6.7 7.3 6.7  Total Bilirubin 0.3 - 1.2 mg/dL 0.3 0.5 0.4  Alkaline Phos 38 - 126 U/L 52 50 50  AST 15  - 41 U/L '17 16 15  ' ALT 0 - 44 U/L '9 12 13    ' Lab Results  Component Value Date   WBC 3.8 (L) 08/19/2020   HGB 12.4 08/19/2020   HCT 37.8 08/19/2020   MCV 95.5 08/19/2020   PLT 275 08/19/2020   NEUTROABS 2.8 08/19/2020    ASSESSMENT & PLAN:  Breast cancer of upper-inner quadrant of right female breast ([place]) Right breast biopsy 2:00 6 cm from nipple: Grade 2-3 invasive ductal cancer, ER 90%, PR 2%, Ki-67 80%, HER-2 positive ratio 2.07; right biopsy 2:00 4 cm from nipple: IDC with DCIS, ER 95%, PR 10%, Ki-67 90%, HER-2 negative ratio 1.27 Breast MRI08/30/2017: 3 enhancing masses in the upper inner quadrant right breast spanning an area 3.7 cm (1.7 cm, 1 cm, 1.4 cm) no lymph node enlargement Clinical stage: T2N0 (stage  2A)  Prior treatment summary: 1. Genetic counseling: Normal did not reveal any abnormal mutations 2. Neoadjuvant chemotherapy. [facility] Perjeta 6 cycles followed by Herceptin And Perjetamaintenance completed 10/20/2016) from 12/31/15 to 04/15/16 3. Right mastectomy with reconstruction: 05/19/2016:Right mastectomy: IDC grade 3, 1.3 cm, low-grade DCIS, LVIDS present, margins negative, 0/1 lymph node negative, ypT1CypN0 stage IA; repeat HER-2 negative ratio 1.41 4. Adjuvant antiestrogen therapy With letrozole 2.5 mg daily started March 2018 -------------------------------------------------------------------------------------------------------------------------------------------------- Relapsed breast cancer: Subcutaneous disease: 1.8 cm measured by ultrasound Biopsy revealed grade 2-3 IDC ER 90% PR 0% HER-2 negative Ki-67 40% 11/30/2018 right lumpectomy [name]): IDC, grade 3, 2.1cm, perineural invasion present, carcinoma present focally at the posterior margin and involving skeletal muscle. Oncotype score 52: Greater than 39% risk of distant recurrence with hormone therapy alone, chemo benefit greater than 15%  Recommendation: 1.Surgery to remove the  tumor8/03/2019 2.Adjuvant chemotherapy withTaxotere and Cytoxan x6 cycles 12/30/2018 -04/07/2019 3.Adjuvant radiationstarted 05/19/2019-06/29/2019 4.Current treatment: Adjuvant antiestrogen therapywith anastrozole started 05/26/2019 with abemaciclib (abemaciclib is for 2 years) ------------------------------------------------------------------------------------------------------------------------------------ Anastrozole toxicities:None Abemaciclib toxicities: Abdominal pain: Improved with reducing the dosage of abemaciclib to 150 mg daily.  Because of the pharmacokinetics, we decided to change her dosing to 100 mg twice a day from 08/19/2020    Blood work looks excellent.  Chest wall/implant discomfort: plastic surgery at [facility] Dr. Hardin Negus. DIEP flap in February 2022.    Breast MRI 05/16/2020: No evidence of malignancy in either breast.  Periimplant edema and fluid collection right breast implant area are stable Patient is also willing to provide counseling for any other patient who wants to undergo the D IEP flap.  Return to clinic in 4 weeks for labs and follow-up to see how she is tolerating the 100 mg twice a day dosing.     Orders Placed This Encounter  Procedures  . MM DIAG BREAST TOMO UNI LEFT    Standing Status:   Future    Standing Expiration Date:   08/19/2021    Order Specific Question:   Reason for Exam (SYMPTOM  OR DIAGNOSIS REQUIRED)    Answer:   Annual mammograms with H/O right breast cancer    Order Specific Question:   Preferred imaging location?    Answer:   [facility]    Order Specific Question:   Release to patient    Answer:   Immediate  . CBC with Differential (Cancer Center Only)    Standing Status:   Future    Standing Expiration Date:   08/19/2021  . CMP ([place] only)    Standing Status:   Future    Standing Expiration Date:   08/19/2021   The patient has a good understanding of the overall plan. she agrees with it. she will call with  any problems that may develop before the next visit here.  Total time spent: 30 mins including face to face time and time spent for planning, charting and coordination of care  Rulon Eisenmenger, MD, MPH 08/19/2020  I, Cloyde Reams Dorshimer, am acting as scribe for Dr. Nicholas Lose.  I have reviewed the above documentation for accuracy and completeness, and I agree with the above.

## 2020-08-19 ENCOUNTER — Other Ambulatory Visit: Payer: Self-pay | Admitting: *Deleted

## 2020-08-19 ENCOUNTER — Inpatient Hospital Stay: Payer: Medicare Other | Attending: Hematology and Oncology | Admitting: Hematology and Oncology

## 2020-08-19 ENCOUNTER — Other Ambulatory Visit: Payer: Self-pay

## 2020-08-19 ENCOUNTER — Inpatient Hospital Stay: Payer: Medicare Other

## 2020-08-19 DIAGNOSIS — C50211 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 female breast: Secondary | ICD-10-CM | POA: Insufficient documentation

## 2020-08-19 DIAGNOSIS — Z9011 Acquired absence of right breast and nipple: Secondary | ICD-10-CM | POA: Insufficient documentation

## 2020-08-19 DIAGNOSIS — Z9221 Personal history of antineoplastic chemotherapy: Secondary | ICD-10-CM | POA: Insufficient documentation

## 2020-08-19 DIAGNOSIS — Z170 Estrogen receptor positive status [ER+]: Secondary | ICD-10-CM

## 2020-08-19 DIAGNOSIS — Z923 Personal history of irradiation: Secondary | ICD-10-CM | POA: Diagnosis not present

## 2020-08-19 LAB — CMP (CANCER CENTER ONLY)
ALT: 9 U/L (ref 0–44)
AST: 17 U/L (ref 15–41)
Albumin: 3.9 g/dL (ref 3.5–5.0)
Alkaline Phosphatase: 52 U/L (ref 38–126)
Anion gap: 8 (ref 5–15)
BUN: 24 mg/dL — ABNORMAL HIGH (ref 8–23)
CO2: 25 mmol/L (ref 22–32)
Calcium: 9 mg/dL (ref 8.9–10.3)
Chloride: 107 mmol/L (ref 98–111)
Creatinine: 0.82 mg/dL (ref 0.44–1.00)
GFR, Estimated: >60 mL/min (ref >60)
Glucose, Bld: 69 mg/dL — ABNORMAL LOW (ref 70–99)
Potassium: 3.9 mmol/L (ref 3.5–5.1)
Sodium: 140 mmol/L (ref 135–145)
Total Bilirubin: 0.3 mg/dL (ref 0.3–1.2)
Total Protein: 6.7 g/dL (ref 6.5–8.1)

## 2020-08-19 LAB — CBC WITH DIFFERENTIAL (CANCER CENTER ONLY)
Abs Immature Granulocytes: 0.01 10*3/uL (ref 0.00–0.07)
Basophils Absolute: 0.1 10*3/uL (ref 0.0–0.1)
Basophils Relative: 1 %
Eosinophils Absolute: 0.1 10*3/uL (ref 0.0–0.5)
Eosinophils Relative: 2 %
HCT: 37.8 % (ref 36.0–46.0)
Hemoglobin: 12.4 g/dL (ref 12.0–15.0)
Immature Granulocytes: 0 %
Lymphocytes Relative: 16 %
Lymphs Abs: 0.6 10*3/uL — ABNORMAL LOW (ref 0.7–4.0)
MCH: 31.3 pg (ref 26.0–34.0)
MCHC: 32.8 g/dL (ref 30.0–36.0)
MCV: 95.5 fL (ref 80.0–100.0)
Monocytes Absolute: 0.3 10*3/uL (ref 0.1–1.0)
Monocytes Relative: 8 %
Neutro Abs: 2.8 10*3/uL (ref 1.7–7.7)
Neutrophils Relative %: 73 %
Platelet Count: 275 10*3/uL (ref 150–400)
RBC: 3.96 MIL/uL (ref 3.87–5.11)
RDW: 13.3 % (ref 11.5–15.5)
WBC Count: 3.8 10*3/uL — ABNORMAL LOW (ref 4.0–10.5)
nRBC: 0 % (ref 0.0–0.2)

## 2020-08-19 MED ORDER — ABEMACICLIB 100 MG PO TABS: 100.0000 mg | ORAL_TABLET | Freq: Two times a day (BID) | ORAL | 6 refills | Status: DC

## 2020-08-20 NOTE — Telephone Encounter (Signed)
See phone note

## 2020-08-21 ENCOUNTER — Ambulatory Visit (INDEPENDENT_AMBULATORY_CARE_PROVIDER_SITE_OTHER): Payer: Medicare Other | Admitting: Psychology

## 2020-08-21 DIAGNOSIS — F064 Anxiety disorder due to known physiological condition: Secondary | ICD-10-CM | POA: Diagnosis not present

## 2020-09-06 ENCOUNTER — Ambulatory Visit: Payer: Medicare Other | Admitting: Psychology

## 2020-09-16 NOTE — Progress Notes (Signed)
Patient Care Team: [name], MD as PCP - General (Family Medicine) [name], RN as Oncology Nurse Navigator Mauro Kaufmann, RN as Oncology Nurse Navigator  DIAGNOSIS:    ICD-10-CM   1. 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 breast in female, estrogen receptor positive ([place])  C50.211    Z17.0     SUMMARY OF ONCOLOGIC HISTORY: Oncology History  Breast cancer of upper-inner quadrant of right female breast ([place])  12/05/2015 Mammogram   Right breast mass 2:00 4 cm from nipple: 1.7 cm, T1c N0 stage IA; right breast 2:00 6 cm from nipple 1.3 cm mass, 7 mm satellite nodule between the 2 masses, T1 cN0 stage IA   12/06/2015 Initial Diagnosis   Right breast biopsy 2:00 6 cm from nipple: Grade 2-3 invasive ductal cancer, ER 90%, PR 2%, Ki-67 80%, HER-2 positive ratio 2.07; right biopsy 2:00 4 cm from nipple: IDC with DCIS, ER 95%, PR 10%, Ki-67 90%, HER-2 negative ratio 1.27   12/18/2015 Breast MRI   3 enhancing masses in the upper inner quadrant right breast spanning an area 3.7 cm (1.7 cm, 1 cm, 1.4 cm) no lymph node enlargement   12/31/2015 - 04/15/2016 Neo-Adjuvant Chemotherapy   [facility] Perjeta 6 cycles followed by Herceptin, Perjeta maintenance for 1 year completed 10/20/2016   01/08/2016 - 01/12/2016 Hospital Admission   Neutropenic fever hospitalization (because patient did not receive Neulasta with cycle 1)   01/10/2016 Miscellaneous   Genetic testing was normal did not reveal any mutations   04/16/2016 Breast MRI   Near CR to therapy. Previous 2 biopsied massses are no longer seen. Third satellite lesion is smaller from 10 mm to 5.3 mm, no abnormal LN.   05/19/2016 Surgery   Right mastectomy: IDC grade 3, 1.3 cm, low-grade DCIS, LVIDS present, margins negative, 0/1 lymph node negative, ypT1CypN0 stage IA; repeat HER-2 negative ratio 1.41   05/19/2016 Surgery   Right breast reconstruction with tissue expander. Acellular dermis for breast reconstruction  (Dr.Thimappa)    06/22/2016 -  Anti-estrogen oral therapy   Letrozole 2.5 mg daily   11/01/2018 Relapse/Recurrence   Patient palpated lump in the UIQ at surgical scar of reconstructed right breast. US of the right breast showed a 1.8cm mass suspicious for recurrent breast cancer. Biopsy showed grade 2 invasive ductal carcinoma, HER-2 negative (1+), ER 90%, PR negative, Ki67 40%.   11/11/2018 Oncotype testing   Oncotype score 52: Greater than 39% risk of distant recurrence with hormone therapy alone, chemo benefit greater than 15%   11/30/2018 Surgery   Right lumpectomy [name]): IDC, grade 3, 2.1cm, perineural invasion present, carcinoma present focally at the posterior margin and involving skeletal muscle.   11/30/2018 Cancer Staging   Staging form: Breast, AJCC 8th Edition - Pathologic stage from 11/30/2018: Stage IIA (pT2, pN0, cM0, G3, ER+, PR-, HER2-) - Signed by Gardenia Phlegm, NP on 12/14/2018   12/23/2018 - 04/07/2019 Chemotherapy   Taxotere and Cytoxan x6 cycles   05/18/2019 - 06/29/2019 Radiation Therapy   Adjuvant radiation   10/09/2019 Miscellaneous   Abemaciclib 300 mg daily started, decrease to 150 mg daily 10/15/2019 due to abdominal pain     CHIEF COMPLIANT: Follow-up of right breast cancer on anastrozoleandabemaciclib  INTERVAL HISTORY: Patricia Chandler is a 65 y.o. with above-mentioned history of recurrent right breast cancerwho underwent a lumpectomy,adjuvant chemotherapy,radiation, andis currently on anastrozoleandabemaciclib.She presents to the clinic todayfor follow-up. She is tolerating 100 twice daily dose much better.  She has  had occasional diarrhea but it is not affecting her.  Energy levels are excellent.  ALLERGIES:  has No Known Allergies.  MEDICATIONS:  Current Outpatient Medications  Medication Sig Dispense Refill  . abemaciclib (VERZENIO) 100 MG tablet Take 1 tablet (100 mg total) by mouth 2 (two) times daily. Swallow tablets  whole. Do not chew, crush, or split tablets before swallowing. 60 tablet 6  . anastrozole (ARIMIDEX) 1 MG tablet TAKE 1 TABLET BY MOUTH DAILY 90 tablet 0  . Biotin 1 MG CAPS Take by mouth.    . calcium-vitamin D (OSCAL WITH D) 500-200 MG-UNIT tablet Take 1 tablet by mouth daily with breakfast.    . denosumab (PROLIA) 60 MG/ML SOLN injection Inject 60 mg into the skin every 6 (six) months. Administer in upper arm, thigh, or abdomen    . Lactobacillus (PROBIOTIC ACIDOPHILUS PO) Take 1 capsule by mouth daily.     Marland Kitchen omeprazole (PRILOSEC) 20 MG capsule Take 20 mg by mouth daily as needed (heartburn).    . zolpidem (AMBIEN) 5 MG tablet TAKE 1 TABLET BY MOUTH EACH NIGHT AT BEDTIME 30 tablet 3   No current facility-administered medications for this visit.    PHYSICAL EXAMINATION: ECOG PERFORMANCE STATUS: 1 - Symptomatic but completely ambulatory  Vitals:   09/17/20 0920  BP: 128/83  Pulse: 76  Resp: 18  Temp: 97.8 F (36.6 C)  SpO2: 99%   Filed Weights   09/17/20 0920  Weight: 156 lb 0.2 oz (70.8 kg)    LABORATORY DATA:  I have reviewed the data as listed CMP Latest Ref Rng & Units 09/17/2020 08/19/2020 04/23/2020  Glucose 70 - 99 mg/dL 90 69(L) 101(H)  BUN 8 - 23 mg/dL 27(H) 24(H) 19  Creatinine 0.44 - 1.00 mg/dL 0.81 0.82 1.01(H)  Sodium 135 - 145 mmol/L 138 140 136  Potassium 3.5 - 5.1 mmol/L 3.8 3.9 3.8  Chloride 98 - 111 mmol/L 105 107 103  CO2 22 - 32 mmol/L '24 25 25  ' Calcium 8.9 - 10.3 mg/dL 8.8(L) 9.0 9.9  Total Protein 6.5 - 8.1 g/dL 6.3(L) 6.7 7.3  Total Bilirubin 0.3 - 1.2 mg/dL 0.5 0.3 0.5  Alkaline Phos 38 - 126 U/L 46 52 50  AST 15 - 41 U/L '15 17 16  ' ALT 0 - 44 U/L '9 9 12    ' Lab Results  Component Value Date   WBC 3.4 (L) 09/17/2020   HGB 12.3 09/17/2020   HCT 36.7 09/17/2020   MCV 94.3 09/17/2020   PLT 220 09/17/2020   NEUTROABS 2.4 09/17/2020    ASSESSMENT & PLAN:  Breast cancer of upper-inner quadrant of right female breast ([place]) Right breast biopsy 2:00  6 cm from nipple: Grade 2-3 invasive ductal cancer, ER 90%, PR 2%, Ki-67 80%, HER-2 positive ratio 2.07; right biopsy 2:00 4 cm from nipple: IDC with DCIS, ER 95%, PR 10%, Ki-67 90%, HER-2 negative ratio 1.27 Breast MRI08/30/2017: 3 enhancing masses in the upper inner quadrant right breast spanning an area 3.7 cm (1.7 cm, 1 cm, 1.4 cm) no lymph node enlargement Clinical stage: T2N0 (stage 2A)  Prior treatment summary: 1. Genetic counseling: Normal did not reveal any abnormal mutations 2. Neoadjuvant chemotherapy. [facility] Perjeta 6 cycles followed by Herceptin And Perjetamaintenance completed 10/20/2016) from 12/31/15 to 04/15/16 3. Right mastectomy with reconstruction: 05/19/2016:Right mastectomy: IDC grade 3, 1.3 cm, low-grade DCIS, LVIDS present, margins negative, 0/1 lymph node negative, ypT1CypN0 stage IA; repeat HER-2 negative ratio 1.41 4. Adjuvant antiestrogen therapy With letrozole 2.5  mg daily started March 2018 -------------------------------------------------------------------------------------------------------------------------------------------------- Relapsed breast cancer: Subcutaneous disease: 1.8 cm measured by ultrasound Biopsy revealed grade 2-3 IDC ER 90% PR 0% HER-2 negative Ki-67 40% 11/30/2018 right lumpectomy [name]): IDC, grade 3, 2.1cm, perineural invasion present, carcinoma present focally at the posterior margin and involving skeletal muscle. Oncotype score 52: Greater than 39% risk of distant recurrence with hormone therapy alone, chemo benefit greater than 15%  Recommendation: 1.Surgery to remove the tumor8/03/2019 2.Adjuvant chemotherapy withTaxotere and Cytoxan x6 cycles 12/30/2018 -04/07/2019 3.Adjuvant radiationstarted 05/19/2019-06/29/2019 4.Current treatment: Adjuvant antiestrogen therapywith anastrozole started 05/26/2019 with abemaciclib (abemaciclib is for 2  years) ------------------------------------------------------------------------------------------------------------------------------------ Anastrozole toxicities:None Abemaciclib toxicities: Abdominal pain: no further problems in spite of changing the dosage to 100 mg twice a day from 08/19/2020 Occasional loose stools Decreased WBC count: Monitoring closely. Instructed her to drink more water because it is summer and she spends a lot of time outdoors. We discussed the role of second COVID booster.  She will likely receive it.  Return to clinic in 6 weeks for labs only Return to clinic in 3 months for labs and follow-up    No orders of the defined types were placed in this encounter.  The patient has a good understanding of the overall plan. she agrees with it. she will call with any problems that may develop before the next visit here.  Total time spent: 30 mins including face to face time and time spent for planning, charting and coordination of care  Rulon Eisenmenger, MD, MPH 09/17/2020  I, Cloyde Reams Dorshimer, am acting as scribe for Dr. Nicholas Lose.  I have reviewed the above documentation for accuracy and completeness, and I agree with the above.

## 2020-09-17 ENCOUNTER — Other Ambulatory Visit: Payer: Self-pay

## 2020-09-17 ENCOUNTER — Inpatient Hospital Stay: Payer: Medicare Other | Admitting: Hematology and Oncology

## 2020-09-17 ENCOUNTER — Inpatient Hospital Stay: Payer: Medicare Other

## 2020-09-17 DIAGNOSIS — Z170 Estrogen receptor positive status [ER+]: Secondary | ICD-10-CM

## 2020-09-17 DIAGNOSIS — C50211 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 female breast: Secondary | ICD-10-CM

## 2020-09-17 DIAGNOSIS — Z9011 Acquired absence of right breast and nipple: Secondary | ICD-10-CM | POA: Diagnosis not present

## 2020-09-17 DIAGNOSIS — Z9221 Personal history of antineoplastic chemotherapy: Secondary | ICD-10-CM | POA: Diagnosis not present

## 2020-09-17 DIAGNOSIS — Z923 Personal history of irradiation: Secondary | ICD-10-CM | POA: Diagnosis not present

## 2020-09-17 LAB — CBC WITH DIFFERENTIAL (CANCER CENTER ONLY)
Abs Immature Granulocytes: 0.01 10*3/uL (ref 0.00–0.07)
Basophils Absolute: 0.1 10*3/uL (ref 0.0–0.1)
Basophils Relative: 2 %
Eosinophils Absolute: 0.1 10*3/uL (ref 0.0–0.5)
Eosinophils Relative: 3 %
HCT: 36.7 % (ref 36.0–46.0)
Hemoglobin: 12.3 g/dL (ref 12.0–15.0)
Immature Granulocytes: 0 %
Lymphocytes Relative: 16 %
Lymphs Abs: 0.5 10*3/uL — ABNORMAL LOW (ref 0.7–4.0)
MCH: 31.6 pg (ref 26.0–34.0)
MCHC: 33.5 g/dL (ref 30.0–36.0)
MCV: 94.3 fL (ref 80.0–100.0)
Monocytes Absolute: 0.3 10*3/uL (ref 0.1–1.0)
Monocytes Relative: 9 %
Neutro Abs: 2.4 10*3/uL (ref 1.7–7.7)
Neutrophils Relative %: 70 %
Platelet Count: 220 10*3/uL (ref 150–400)
RBC: 3.89 MIL/uL (ref 3.87–5.11)
RDW: 14.2 % (ref 11.5–15.5)
WBC Count: 3.4 10*3/uL — ABNORMAL LOW (ref 4.0–10.5)
nRBC: 0 % (ref 0.0–0.2)

## 2020-09-17 LAB — CMP (CANCER CENTER ONLY)
ALT: 9 U/L (ref 0–44)
AST: 15 U/L (ref 15–41)
Albumin: 3.7 g/dL (ref 3.5–5.0)
Alkaline Phosphatase: 46 U/L (ref 38–126)
Anion gap: 9 (ref 5–15)
BUN: 27 mg/dL — ABNORMAL HIGH (ref 8–23)
CO2: 24 mmol/L (ref 22–32)
Calcium: 8.8 mg/dL — ABNORMAL LOW (ref 8.9–10.3)
Chloride: 105 mmol/L (ref 98–111)
Creatinine: 0.81 mg/dL (ref 0.44–1.00)
GFR, Estimated: >60 mL/min (ref >60)
Glucose, Bld: 90 mg/dL (ref 70–99)
Potassium: 3.8 mmol/L (ref 3.5–5.1)
Sodium: 138 mmol/L (ref 135–145)
Total Bilirubin: 0.5 mg/dL (ref 0.3–1.2)
Total Protein: 6.3 g/dL — ABNORMAL LOW (ref 6.5–8.1)

## 2020-09-17 NOTE — Assessment & Plan Note (Signed)
Right breast biopsy 2:00 6 cm from nipple: Grade 2-3 invasive ductal cancer, ER 90%, PR 2%, Ki-67 80%, HER-2 positive ratio 2.07; right biopsy 2:00 4 cm from nipple: IDC with DCIS, ER 95%, PR 10%, Ki-67 90%, HER-2 negative ratio 1.27 Breast MRI08/30/2017: 3 enhancing masses in the upper inner quadrant right breast spanning an area 3.7 cm (1.7 cm, 1 cm, 1.4 cm) no lymph node enlargement Clinical stage: T2N0 (stage 2A)  Prior treatment summary: 1. Genetic counseling: Normal did not reveal any abnormal mutations 2. Neoadjuvant chemotherapy. [facility] Perjeta 6 cycles followed by Herceptin And Perjetamaintenance completed 10/20/2016) from 12/31/15 to 04/15/16 3. Right mastectomy with reconstruction: 05/19/2016:Right mastectomy: IDC grade 3, 1.3 cm, low-grade DCIS, LVIDS present, margins negative, 0/1 lymph node negative, ypT1CypN0 stage IA; repeat HER-2 negative ratio 1.41 4. Adjuvant antiestrogen therapy With letrozole 2.5 mg daily started March 2018 -------------------------------------------------------------------------------------------------------------------------------------------------- Relapsed breast cancer: Subcutaneous disease: 1.8 cm measured by ultrasound Biopsy revealed grade 2-3 IDC ER 90% PR 0% HER-2 negative Ki-67 40% 11/30/2018 right lumpectomy [name]): IDC, grade 3, 2.1cm, perineural invasion present, carcinoma present focally at the posterior margin and involving skeletal muscle. Oncotype score 52: Greater than 39% risk of distant recurrence with hormone therapy alone, chemo benefit greater than 15%  Recommendation: 1.Surgery to remove the tumor8/03/2019 2.Adjuvant chemotherapy withTaxotere and Cytoxan x6 cycles 12/30/2018 -04/07/2019 3.Adjuvant radiationstarted 05/19/2019-06/29/2019 4.Current treatment: Adjuvant antiestrogen therapywith anastrozole started 05/26/2019 with abemaciclib (abemaciclib is for 2  years) ------------------------------------------------------------------------------------------------------------------------------------ Anastrozole toxicities:None Abemaciclib toxicities: Abdominal pain: Improved with reducing the dosage of abemaciclib to 150 mg daily.  Because of the pharmacokinetics, we decided to change her dosing to 100 mg twice a day from 08/19/2020  Return to clinic in 3 months for labs and follow-up

## 2020-09-20 ENCOUNTER — Encounter: Payer: Self-pay | Admitting: Family Medicine

## 2020-09-20 ENCOUNTER — Ambulatory Visit (INDEPENDENT_AMBULATORY_CARE_PROVIDER_SITE_OTHER): Payer: Medicare Other | Admitting: Family Medicine

## 2020-09-20 ENCOUNTER — Other Ambulatory Visit: Payer: Self-pay

## 2020-09-20 VITALS — BP 118/80 | HR 100 | Temp 98.0°F | Ht 63.0 in | Wt 155.2 lb

## 2020-09-20 DIAGNOSIS — C50211 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 female breast: Secondary | ICD-10-CM

## 2020-09-20 DIAGNOSIS — Z170 Estrogen receptor positive status [ER+]: Secondary | ICD-10-CM

## 2020-09-20 DIAGNOSIS — Z13220 Encounter for screening for lipoid disorders: Secondary | ICD-10-CM | POA: Diagnosis not present

## 2020-09-20 DIAGNOSIS — Z0000 Encounter for general adult medical examination without abnormal findings: Secondary | ICD-10-CM | POA: Diagnosis not present

## 2020-09-20 DIAGNOSIS — K219 Gastro-esophageal reflux disease without esophagitis: Secondary | ICD-10-CM | POA: Diagnosis not present

## 2020-09-20 NOTE — Patient Instructions (Addendum)
Send [place] dates of covid vaccinations- we somehow have 4 logged- want to remove the one that is not correct.   Lets have you do covid 19 vaccine (at pharmacy since we do not have it)  first then 2-4 weeks later can do prevnar 20 (either here or pharmacy)  See if hematology can add the lipid panel I ordered- if not can schedule a lab visit here for that- doesn't have to be but would be more ideal fasting  Recommended follow up: Return in about 1 year (around 09/20/2021) for physical or sooner if needed.

## 2020-09-20 NOTE — Progress Notes (Signed)
Phone [phone]   Subjective:  Patient presents today for their annual physical. Chief complaint-noted.   See problem oriented charting- ROS- full  review of systems was completed and negative  The following were reviewed and entered/updated in epic: Past Medical History:  Diagnosis Date  . Anemia    due to chemo  . Anxiety   . Breast cancer ([place]) dx'd 2017   right  . Cardiomyopathy ([place])    mild, per cardiology note; due to Herceptin  . Dental crowns present   . Family history of adverse reaction to anesthesia    pt's daughter has hx. of post-op nausea  . GERD (gastroesophageal reflux disease)   . Heart murmur   . History of breast cancer 2017   right  . History of chemotherapy    finished chemo 04/15/2016  . Personal history of chemotherapy   . Personal history of radiation therapy    Right Breast Cancer  . Rash 08/04/2016   under right arm - states recent tick bite; to see MD 08/05/2016  . Recurrent breast cancer, right ([place]) dx'd 10/2018  . Runny nose 06/06/2016   clear drainage, per pt.   Patient Active Problem List   Diagnosis Date Noted  . Port catheter in place 01/08/2016    Priority: High  . Breast cancer of upper-inner quadrant of right female breast ([place]) 12/18/2015    Priority: High  . Acute diverticulitis 06/14/2018    Priority: Medium  . Hypokalemia 03/30/2016    Priority: Medium  . Chemotherapy-induced thrombocytopenia 03/30/2016    Priority: Medium  . GERD (gastroesophageal reflux disease) 01/08/2016    Priority: Medium  . Acute bursitis of right shoulder 06/08/2017    Priority: Low  . Acute shoulder bursitis, left 03/08/2017    Priority: Low  . DDD (degenerative disc disease), cervical 10/08/2016    Priority: Low  . Genetic testing 01/10/2016    Priority: Low  . History of sepsis 01/08/2016    Priority: Low  . Family history of breast cancer     Priority: Low  . Internal hemorrhoid 01/26/2011    Priority: Low  . Osteoporosis  05/07/2020  . Snoring 01/23/2020  . Insomnia 01/08/2020  . Liver lesion 06/21/2018  . Intra-abdominal abscess ([place]) 06/14/2018   Past Surgical History:  Procedure Laterality Date  . AUGMENTATION MAMMAPLASTY Left   . BREAST BIOPSY Right 11/30/2018   Procedure: RIGHT BREAST CHEST WALL RECURRENCE EXCISION;  Surgeon: Rolm Bookbinder, MD;  Location: [facility];  Service: General;  Laterality: Right;  . BREAST ENHANCEMENT SURGERY Bilateral 2005  . breast mass removal Right 11/30/2018  . BREAST RECONSTRUCTION WITH PLACEMENT OF TISSUE EXPANDER AND FLEX HD (ACELLULAR HYDRATED DERMIS) Right 05/19/2016   Procedure: RIGHT BREAST RECONSTRUCTION WITH PLACEMENT OF TISSUE EXPANDER AND  ALLODERM.;  Surgeon: Irene Limbo, MD;  Location: [facility];  Service: Plastics;  Laterality: Right;  . COLONOSCOPY    . COLONOSCOPY WITH PROPOFOL  10/02/2014  . DEBRIDEMENT AND CLOSURE WOUND Right 06/08/2016   Procedure: DEBRIDEMENT RIGHT MASTECTOMY FLAP, TISSUE EXPANSION OF RIGHT CHEST;  Surgeon: Irene Limbo, MD;  Location: [facility];  Service: Plastics;  Laterality: Right;  . MASTECTOMY Right   . MASTOPEXY Left 08/11/2016   Procedure: LEFT BREAST MASTOPEXY;  Surgeon: Irene Limbo, MD;  Location: [facility];  Service: Plastics;  Laterality: Left;  . NIPPLE SPARING MASTECTOMY/SENTINAL LYMPH NODE BIOPSY/RECONSTRUCTION/PLACEMENT OF TISSUE EXPANDER Right 05/19/2016   Procedure: RIGHT NIPPLE SPARING MASTECTOMY WITH SENTINAL LYMPH NODE BIOPSY WITH  BLUE DYE INJECTION;  Surgeon: Rolm Bookbinder, MD;  Location: [facility];  Service: General;  Laterality: Right;  . PORT-A-CATH REMOVAL Right 04/26/2019   Procedure: REMOVAL PORT-A-CATH;  Surgeon: Rolm Bookbinder, MD;  Location: [facility];  Service: General;  Laterality: Right;  . PORTACATH PLACEMENT N/A 12/30/2015   Procedure: INSERTION PORT-A-CATH WITH [place];  Surgeon: Rolm Bookbinder, MD;  Location: [facility];  Service: General;  Laterality: N/A;  . PORTACATH PLACEMENT N/A 11/30/2018   Procedure: INSERTION PORT-A-CATH WITH ULTRASOUND;  Surgeon: Rolm Bookbinder, MD;  Location: [facility];  Service: General;  Laterality: N/A;  . REDUCTION MAMMAPLASTY    . REMOVAL OF TISSUE EXPANDER AND PLACEMENT OF IMPLANT Right 08/11/2016   Procedure: REMOVAL OF RIGHT  TISSUE EXPANDER AND PLACEMENT OF IMPLANT;  Surgeon: Irene Limbo, MD;  Location: [facility];  Service: Plastics;  Laterality: Right;    Family History  Problem Relation Age of Onset  . Breast cancer Maternal Aunt        dx in her 21s  . Stroke Father        late 40s  . Hypertension Father   . Pulmonary fibrosis Mother   . Breast cancer Paternal Aunt 78  . Cancer Paternal Grandmother        abdominal; cervical vs uterine dx in her 70s  . Stomach cancer Maternal Grandmother   . Parkinson's disease Maternal Grandfather   . Anesthesia problems Daughter        post-op nausea  . Colon cancer Neg Hx   . Esophageal cancer Neg Hx   . Rectal cancer Neg Hx     Medications- reviewed and updated Current Outpatient Medications  Medication Sig Dispense Refill  . abemaciclib (VERZENIO) 100 MG tablet Take 1 tablet (100 mg total) by mouth 2 (two) times daily. Swallow tablets whole. Do not chew, crush, or split tablets before swallowing. 60 tablet 6  . anastrozole (ARIMIDEX) 1 MG tablet TAKE 1 TABLET BY MOUTH DAILY 90 tablet 0  . Biotin 1 MG CAPS Take by mouth.    . calcium-vitamin D (OSCAL WITH D) 500-200 MG-UNIT tablet Take 1 tablet by mouth daily with breakfast.    . denosumab (PROLIA) 60 MG/ML SOLN injection Inject 60 mg into the skin every 6 (six) months. Administer in upper arm, thigh, or abdomen    . Lactobacillus (PROBIOTIC ACIDOPHILUS PO) Take 1 capsule by mouth daily.     Marland Kitchen omeprazole (PRILOSEC) 20 MG capsule Take 20 mg by mouth daily as needed (heartburn).    . zolpidem (AMBIEN) 5 MG tablet TAKE 1 TABLET BY MOUTH EACH  NIGHT AT BEDTIME 30 tablet 3  . triamcinolone (NASACORT) 55 MCG/ACT AERO nasal inhaler Place into the nose.    . Turmeric POWD Take by mouth.     No current facility-administered medications for this visit.    Allergies-reviewed and updated No Known Allergies  Social History   Social History Narrative   Married 1978 (Al). 2 girls 33 and 36 in 2018. [name] lives in [place], [place] with 18 yo son (single right now). and [name] in [place], [place]--> now [place] with husband and 31 year old daughter (born with heart defect with missing valve and initially not closed VSD--still weighs heavily on family- likely will have to be repaired).       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    Graduated  from [employer]. [employer] prior.       Hobbies: gardening, decorating, sewing, enjoys creating   Objective  Objective:  BP 118/80   Pulse 100   Temp 98 F (36.7 C) (Temporal)   Ht 5\' 3"  (1.6 m)   Wt 155 lb 3.2 oz (70.4 kg)   SpO2 98%   BMI 27.49 kg/m  Gen: NAD, resting comfortably HEENT: Mucous membranes are moist. Oropharynx normal, TMs and canals normal, nasal turbinates normal  Neck: no thyromegaly, no lymphadenopathy  CV: RRR no murmurs rubs or gallops, no carotid bruit Lungs: CTAB no crackles, wheeze, rhonchi Abdomen: soft/nontender/nondistended/normal bowel sounds. No rebound or guarding.  Ext: no edema, +2 DT pulses  Skin: warm, dry Neuro: grossly normal, moves all extremities, PERRLA   Assessment and Plan   65 y.o. female presenting for annual physical.  Health Maintenance counseling: 1. Anticipatory guidance: Patient counseled regarding regular dental exams-q6 months, eye exams-yearly, avoiding smoking and second hand smoke, limiting alcohol to 1 beverage per day .   2. Risk factor reduction:  Advised patient of need for regular exercise and diet rich and fruits and vegetables to reduce risk of  heart attack and stroke. Exercise- Uses Elliptical 4-5x per week, gardens, and walks occasionally. Wants to complete more weightlifting exercises. After her FLAP procedure she gained weight due to being inactive but she is more active now. Diet- Eats a reasonably healthy diet(currently has her own vegetable garden). Cutting down on carbs.   Wt Readings from Last 3 Encounters:  09/20/20 155 lb 3.2 oz (70.4 kg)  09/17/20 156 lb 0.2 oz (70.8 kg)  08/19/20 155 lb 1.6 oz (70.4 kg)  3. Immunizations/screenings/ancillary studies-discussed new recommendations for Prevnar 20-holding off and plans to get at pharmacy on a later date-discussed COVID-19 vaccination- Lets have you do covid 19 vaccine (at pharmacy since we do not have it)  first then 2-4 weeks later can do prevnar 44 (either here or pharmacy) Immunization History  Administered Date(s) Administered  . Fluad Quad(high Dose 65+) 01/22/2020  . Influenza, High Dose Seasonal PF 12/10/2018  . Influenza,inj,Quad PF,6+ Mos 02/18/2016, 02/01/2017, 02/28/2018  . Influenza-Unspecified 01/19/2016, 02/19/2020  . PFIZER(Purple Top)SARS-COV-2 Vaccination 05/11/2019, 06/01/2019, 11/21/2019, 12/20/2019  . Pneumococcal Conjugate-13 11/02/2018  . Tdap 07/20/2011  . Zoster Recombinat (Shingrix) 06/09/2017, 08/12/2017  4. Cervical cancer screening-past age based screening recommendations 5. Breast cancer follow-up-patient with history of recurrent breast cancer August 2020 with surgery.  Adjuvant chemotherapy and radiation.  She remains on antiestrogen therapy with Arimidex as well as Verzenio-agrees to continue current medication.  Breast exam with Dr. Lindi Adie and mammogram 06/21/2019 one 3D mammogram with MRI of breast 05/16/2020. Alternating exams every 6 months. 6. Colon cancer screening -09/09/2018 with 7-year repeat due to polyp history 7. Skin cancer screening- Has been going every few years for dermatology visit. advised regular sunscreen use. Denies worrisome,  changing, or new skin lesions.  8. Birth control/STD check- postmenopausal/monogamous 9. Osteoporosis screening at 65-patient on Prolia through hematology- Most recent bone density 09/12/19 -Never smoker  Status of chronic or acute concerns   #FLAP surgery/breast reconstruction- S:After her FLAP procedure on February 23rd 2022 she was inactive for 3 months. On May 23rd, 2022 she started feeling like her normal self again.  A/P: Patient states this has helped with pain from prior radiation damage-she is overall pleased but this was still a challenging time  # Insomnia S:Taking Ambien 5 mg consistently-gets good sleep. Thinks that she developed insomnia secondary to chemotherapy. She tried  Trazadone in the past but she noted having delirium.  A/P: Good control on 5 mg but severe issue sleeping without medication.  She is able to tolerate half tablet-discussed reduce Ambien intake to 2.5 mg nightly if possible.  #History of diverticulitis- No recent reoccurrences-If another abscess appears, surgery will be necessary  # Low Bone density (formerly osteopenia) S: Last DEXA: 09/12/19 -First Prolia 2019 & Last Prolia 06/06/20. She had acid reflux on phosphamax Calcium: 1200mg  (through diet ok) recommended  Vitamin D: 1000 units a day recommended Last vitamin D Lab Results  Component Value Date   VD25OH 43.96 02/01/2017  A/P:  Stable. Continue current medications.  We discussed in several years whenever we transition her off Prolia using a bisphosphonate-she had reflux on Fosamax so would require Reclast  # History of liver lesion S:Potential 9 mm hemangioma on 06/14/2018 CT scan- on repeat imaging 07/01/2018 this was noted again-no further follow-up was suggested.  Repeat CT abdomen pelvis 11/11/2018 with lesion noted as "unchanged in the interval" A/P: No further follow-up suggested by radiology  #  Breast cancer-follows closely with oncologist Dr. Lindi Adie   #Screening for hyperlipidemia-no  recent lipid panel-recommended updating this today- she is in agreement  # GERD S: Omeprazole 20 mg-No recent reoccurrences  A/P:  Stable. Continue current medications.  Consider reducing to Pepcid in the future Recommended follow up: Return in about 1 year (around 09/20/2021) for physical or sooner if needed. Future Appointments  Date Time Provider [place]  09/23/2020  3:00 PM Natividad Brood, Emma [facility] None  10/15/2020 12:00 PM Natividad Brood, LCSW [facility] None  10/29/2020  9:30 AM [facility] None  12/10/2020 10:30 AM [facility] NURSE [facility]  12/18/2020  9:30 AM [facility] None  12/18/2020 10:00 AM Nicholas Lose, MD [facility] None  09/23/2021  1:20 PM [name], MD [facility]   Lab/Order associations: No labs today-we will try to do lipid panel fasting with hematology   ICD-10-CM   1. Preventative health care  Z00.00   2. Screening for hyperlipidemia  Z13.220 Lipid Panel w/reflex Direct LDL  3. 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 breast in female, estrogen receptor positive ([facility])  C50.211    Z17.0   4. Gastroesophageal reflux disease without esophagitis  K21.9    Return precautions advised.  Garret Reddish, MD

## 2020-09-21 NOTE — Addendum Note (Signed)
Addended by: [name] on: 09/21/2020 11:15 AM   Modules accepted: Level of Service

## 2020-09-23 ENCOUNTER — Ambulatory Visit (INDEPENDENT_AMBULATORY_CARE_PROVIDER_SITE_OTHER): Payer: Medicare Other | Admitting: Psychology

## 2020-09-23 DIAGNOSIS — F064 Anxiety disorder due to known physiological condition: Secondary | ICD-10-CM

## 2020-09-24 ENCOUNTER — Encounter: Payer: Self-pay | Admitting: Family Medicine

## 2020-10-01 ENCOUNTER — Other Ambulatory Visit: Payer: Self-pay | Admitting: Hematology and Oncology

## 2020-10-07 ENCOUNTER — Encounter: Payer: Self-pay | Admitting: Hematology and Oncology

## 2020-10-07 ENCOUNTER — Other Ambulatory Visit: Payer: Self-pay | Admitting: *Deleted

## 2020-10-07 DIAGNOSIS — C50211 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 female breast: Secondary | ICD-10-CM

## 2020-10-07 DIAGNOSIS — Z170 Estrogen receptor positive status [ER+]: Secondary | ICD-10-CM

## 2020-10-08 DIAGNOSIS — Z9011 Acquired absence of right breast and nipple: Secondary | ICD-10-CM | POA: Diagnosis not present

## 2020-10-08 DIAGNOSIS — C50211 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 female breast: Secondary | ICD-10-CM | POA: Diagnosis not present

## 2020-10-08 DIAGNOSIS — Z9889 Other specified postprocedural states: Secondary | ICD-10-CM | POA: Diagnosis not present

## 2020-10-15 ENCOUNTER — Ambulatory Visit: Payer: Medicare Other | Admitting: Psychology

## 2020-10-28 ENCOUNTER — Encounter: Payer: Self-pay | Admitting: Hematology and Oncology

## 2020-10-28 ENCOUNTER — Other Ambulatory Visit: Payer: Self-pay | Admitting: *Deleted

## 2020-10-28 DIAGNOSIS — C50211 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 female breast: Secondary | ICD-10-CM

## 2020-10-28 DIAGNOSIS — Z170 Estrogen receptor positive status [ER+]: Secondary | ICD-10-CM

## 2020-10-28 NOTE — Progress Notes (Signed)
No notes

## 2020-10-29 ENCOUNTER — Other Ambulatory Visit: Payer: Self-pay

## 2020-10-29 ENCOUNTER — Inpatient Hospital Stay: Payer: Medicare Other | Attending: Hematology and Oncology

## 2020-10-29 ENCOUNTER — Other Ambulatory Visit: Payer: Self-pay | Admitting: Family Medicine

## 2020-10-29 DIAGNOSIS — C50211 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 female breast: Secondary | ICD-10-CM

## 2020-10-29 DIAGNOSIS — Z7689 Persons encountering health services in other specified circumstances: Secondary | ICD-10-CM | POA: Diagnosis not present

## 2020-10-29 DIAGNOSIS — Z170 Estrogen receptor positive status [ER+]: Secondary | ICD-10-CM | POA: Insufficient documentation

## 2020-10-29 DIAGNOSIS — Z79899 Other long term (current) drug therapy: Secondary | ICD-10-CM | POA: Diagnosis not present

## 2020-10-29 LAB — CBC WITH DIFFERENTIAL (CANCER CENTER ONLY)
Abs Immature Granulocytes: 0.02 10*3/uL (ref 0.00–0.07)
Basophils Absolute: 0.1 10*3/uL (ref 0.0–0.1)
Basophils Relative: 2 %
Eosinophils Absolute: 0.2 10*3/uL (ref 0.0–0.5)
Eosinophils Relative: 4 %
HCT: 35.9 % — ABNORMAL LOW (ref 36.0–46.0)
Hemoglobin: 12.5 g/dL (ref 12.0–15.0)
Immature Granulocytes: 1 %
Lymphocytes Relative: 14 %
Lymphs Abs: 0.6 10*3/uL — ABNORMAL LOW (ref 0.7–4.0)
MCH: 32.1 pg (ref 26.0–34.0)
MCHC: 34.8 g/dL (ref 30.0–36.0)
MCV: 92.1 fL (ref 80.0–100.0)
Monocytes Absolute: 0.3 10*3/uL (ref 0.1–1.0)
Monocytes Relative: 7 %
Neutro Abs: 2.9 10*3/uL (ref 1.7–7.7)
Neutrophils Relative %: 72 %
Platelet Count: 226 10*3/uL (ref 150–400)
RBC: 3.9 MIL/uL (ref 3.87–5.11)
RDW: 14.1 % (ref 11.5–15.5)
WBC Count: 4 10*3/uL (ref 4.0–10.5)
nRBC: 0 % (ref 0.0–0.2)

## 2020-10-29 LAB — CMP (CANCER CENTER ONLY)
ALT: 13 U/L (ref 0–44)
AST: 18 U/L (ref 15–41)
Albumin: 3.8 g/dL (ref 3.5–5.0)
Alkaline Phosphatase: 50 U/L (ref 38–126)
Anion gap: 9 (ref 5–15)
BUN: 21 mg/dL (ref 8–23)
CO2: 26 mmol/L (ref 22–32)
Calcium: 9.9 mg/dL (ref 8.9–10.3)
Chloride: 103 mmol/L (ref 98–111)
Creatinine: 0.87 mg/dL (ref 0.44–1.00)
GFR, Estimated: >60 mL/min (ref >60)
Glucose, Bld: 71 mg/dL (ref 70–99)
Potassium: 4.1 mmol/L (ref 3.5–5.1)
Sodium: 138 mmol/L (ref 135–145)
Total Bilirubin: 0.5 mg/dL (ref 0.3–1.2)
Total Protein: 6.8 g/dL (ref 6.5–8.1)

## 2020-10-30 LAB — THYROID PANEL WITH TSH
Free Thyroxine Index: 2.2 (ref 1.2–4.9)
T3 Uptake Ratio: 26 % (ref 24–39)
T4, Total: 8.6 ug/dL (ref 4.5–12.0)
TSH: 1.48 u[IU]/mL (ref 0.450–4.500)

## 2020-11-01 ENCOUNTER — Encounter: Payer: Self-pay | Admitting: Hematology and Oncology

## 2020-11-01 ENCOUNTER — Other Ambulatory Visit: Payer: Self-pay

## 2020-11-01 DIAGNOSIS — C50211 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 female breast: Secondary | ICD-10-CM

## 2020-11-01 DIAGNOSIS — Z170 Estrogen receptor positive status [ER+]: Secondary | ICD-10-CM

## 2020-11-02 LAB — LP
Cholesterol, Total: 210 mg/dL — ABNORMAL HIGH (ref 100–199)
HDL: 90 mg/dL (ref >39)
LDL Chol Calc (NIH): 106 mg/dL — ABNORMAL HIGH (ref 0–99)
Triglycerides: 81 mg/dL (ref 0–149)
VLDL Cholesterol Cal: 14 mg/dL (ref 5–40)

## 2020-11-02 LAB — SPECIMEN STATUS REPORT

## 2020-11-05 ENCOUNTER — Encounter: Payer: Self-pay | Admitting: Family Medicine

## 2020-11-18 ENCOUNTER — Other Ambulatory Visit: Payer: Self-pay | Admitting: Hematology and Oncology

## 2020-12-03 DIAGNOSIS — Z9889 Other specified postprocedural states: Secondary | ICD-10-CM | POA: Diagnosis not present

## 2020-12-03 DIAGNOSIS — Z4889 Encounter for other specified surgical aftercare: Secondary | ICD-10-CM | POA: Diagnosis not present

## 2020-12-03 DIAGNOSIS — Z853 Personal history of malignant neoplasm of breast: Secondary | ICD-10-CM | POA: Diagnosis not present

## 2020-12-10 ENCOUNTER — Other Ambulatory Visit: Payer: Self-pay

## 2020-12-10 ENCOUNTER — Ambulatory Visit (INDEPENDENT_AMBULATORY_CARE_PROVIDER_SITE_OTHER): Payer: Medicare Other

## 2020-12-10 DIAGNOSIS — M810 Age-related osteoporosis without current pathological fracture: Secondary | ICD-10-CM

## 2020-12-10 DIAGNOSIS — Z23 Encounter for immunization: Secondary | ICD-10-CM

## 2020-12-10 MED ORDER — DENOSUMAB 60 MG/ML ~~LOC~~ SOSY
60.0000 mg | PREFILLED_SYRINGE | Freq: Once | SUBCUTANEOUS | Status: AC
  Administered 2020-12-10: 60 mg via SUBCUTANEOUS

## 2020-12-10 NOTE — Progress Notes (Signed)
Per orders of Dr. Yong Channel, injection of Prolia  given by Tobe Sos in left arm. Patient tolerated injection well. Patient will make appointment for 6 months.   She was also given Prevnar 20, she requested vaccine per Dr Ansel Bong requests on 09/20/2020. See last Office Visit for details.

## 2020-12-11 ENCOUNTER — Ambulatory Visit
Admission: RE | Admit: 2020-12-11 | Discharge: 2020-12-11 | Disposition: A | Discharge status: Home / Self Care | Payer: Medicare Other | Source: Ambulatory Visit | Attending: Hematology and Oncology | Admitting: Hematology and Oncology

## 2020-12-11 DIAGNOSIS — C50211 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 female breast: Secondary | ICD-10-CM

## 2020-12-11 DIAGNOSIS — Z1231 Encounter for screening mammogram for malignant neoplasm of breast: Secondary | ICD-10-CM | POA: Diagnosis not present

## 2020-12-11 DIAGNOSIS — Z170 Estrogen receptor positive status [ER+]: Secondary | ICD-10-CM

## 2020-12-15 IMAGING — MG DIGITAL SCREENING UNILAT LEFT IMPLANT  W/ TOMO W/ CAD
6 series · 6 of 14 positions shown · non-contrast
Comparison: Previous exam(s).

CLINICAL DATA: Screening.

EXAM:
DIGITAL SCREENING UNILATERAL LEFT MAMMOGRAM WITH IMPLANTS, CAD AND
TOMO
The patient has retropectoral implants. Standard and implant
displaced views were performed.

[L MLO]
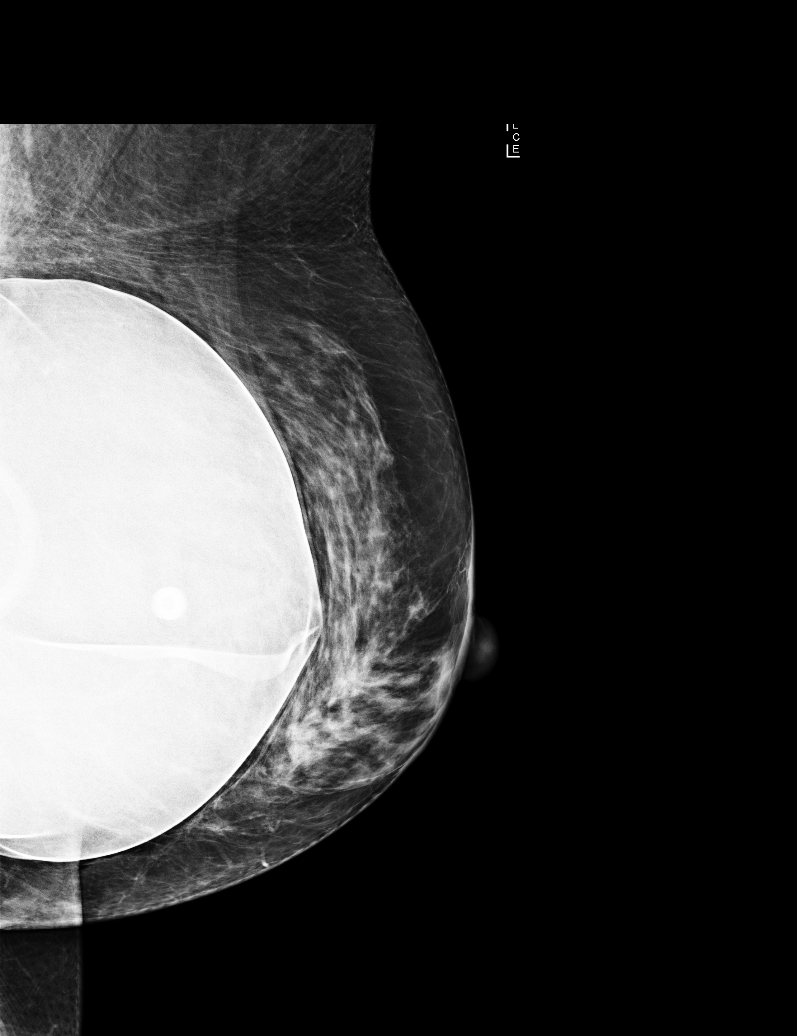

[L CC]
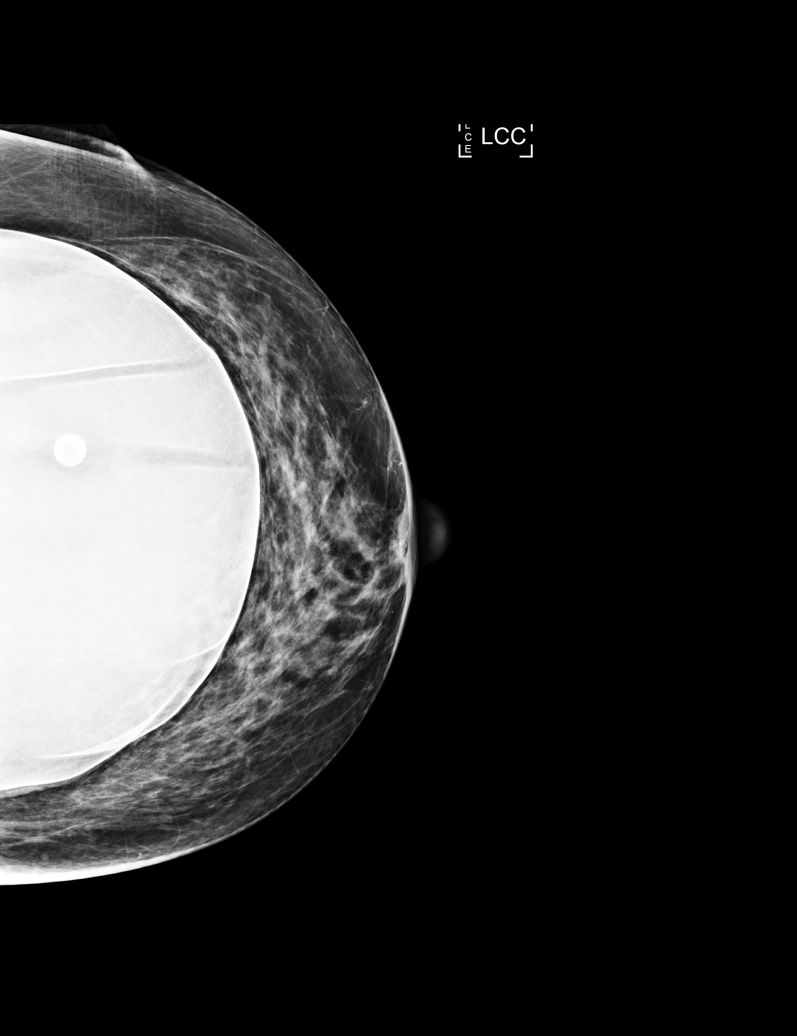

[L CC synth-2D]
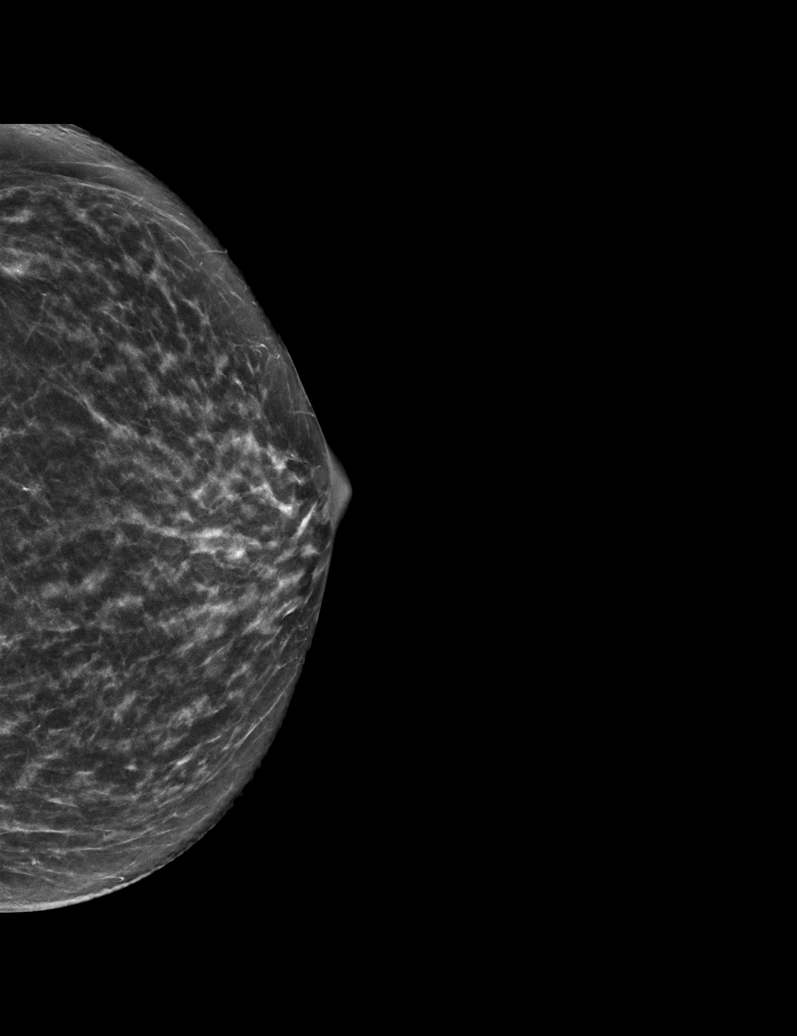

[L MLO synth-2D]
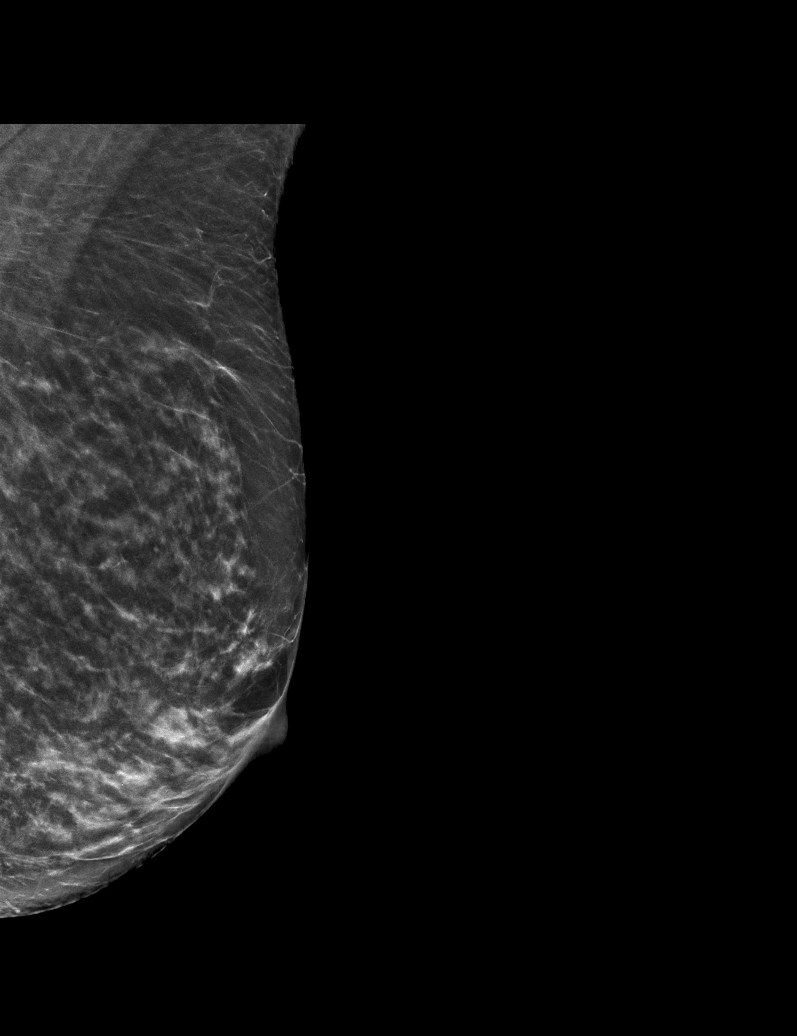

[L CCID BREAST TOMOSYNTHESIS IMAGE tomo · tomo slice 24/47.0]
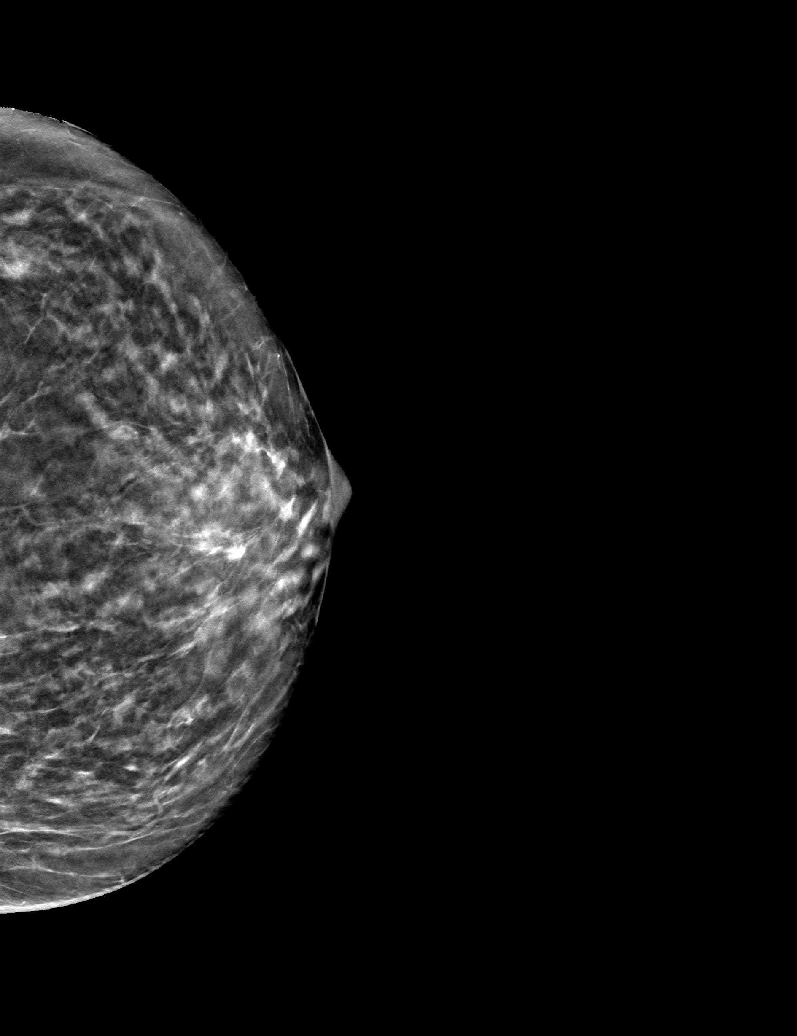

[L MLOID BREAST TOMOSYNTHESIS IMAGE tomo · tomo slice 27/52.0]
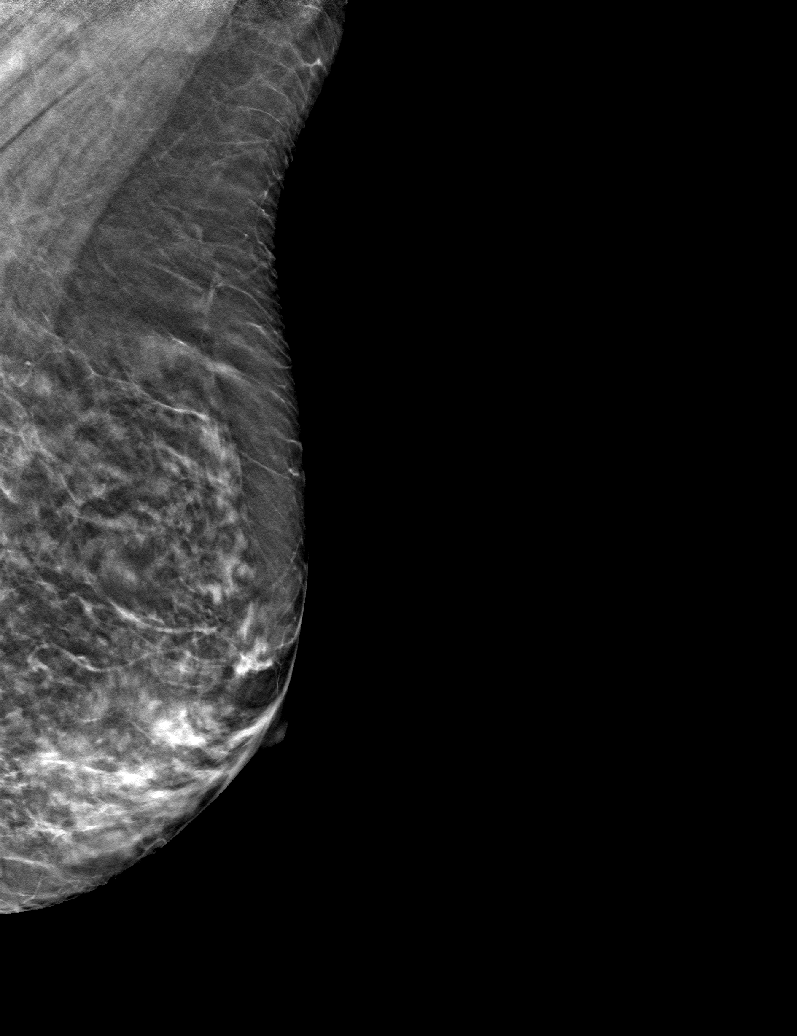

[6 of 14 positions shown; findings below may reference images not displayed]

ACR Breast Density Category c: The breast tissue is heterogeneously
dense, which may obscure small masses.
FINDINGS: There are no findings suspicious for malignancy. Images were
processed with CAD.
IMPRESSION: No mammographic evidence of malignancy. A result letter of this
screening mammogram will be mailed directly to the patient.

RECOMMENDATION:
Screening mammogram in one year. (Code:O8-1-9MA)

BI-RADS CATEGORY  1:  Negative.

## 2020-12-17 NOTE — Progress Notes (Signed)
Patient Care Team: [name], MD as PCP - General (Family Medicine) [name], RN as Oncology Nurse Navigator Mauro Kaufmann, RN as Oncology Nurse Navigator  DIAGNOSIS:    ICD-10-CM   1. 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 breast in female, estrogen receptor positive ([facility])  C50.211    Z17.0       SUMMARY OF ONCOLOGIC HISTORY: Oncology History  Breast cancer of upper-inner quadrant of right female breast ([place] [place])  12/05/2015 Mammogram   Right breast mass 2:00 4 cm from nipple: 1.7 cm, T1c N0 stage IA; right breast 2:00 6 cm from nipple 1.3 cm mass, 7 mm satellite nodule between the 2 masses, T1 cN0 stage IA   12/06/2015 Initial Diagnosis   Right breast biopsy 2:00 6 cm from nipple: Grade 2-3 invasive ductal cancer, ER 90%, PR 2%, Ki-67 80%, HER-2 positive ratio 2.07; right biopsy 2:00 4 cm from nipple: IDC with DCIS, ER 95%, PR 10%, Ki-67 90%, HER-2 negative ratio 1.27   12/18/2015 Breast MRI   3 enhancing masses in the upper inner quadrant right breast spanning an area 3.7 cm (1.7 cm, 1 cm, 1.4 cm) no lymph node enlargement   12/31/2015 - 04/15/2016 Neo-Adjuvant Chemotherapy   [facility] Perjeta 6 cycles followed by Herceptin, Perjeta maintenance for 1 year completed 10/20/2016   01/08/2016 - 01/12/2016 Hospital Admission   Neutropenic fever hospitalization (because patient did not receive Neulasta with cycle 1)   01/10/2016 Miscellaneous   Genetic testing was normal did not reveal any mutations   04/16/2016 Breast MRI   Near CR to therapy. Previous 2 biopsied massses are no longer seen. Third satellite lesion is smaller from 10 mm to 5.3 mm, no abnormal LN.   05/19/2016 Surgery   Right mastectomy: IDC grade 3, 1.3 cm, low-grade DCIS, LVIDS present, margins negative, 0/1 lymph node negative, ypT1CypN0 stage IA; repeat HER-2 negative ratio 1.41   05/19/2016 Surgery   Right breast reconstruction with tissue expander. Acellular dermis for breast reconstruction  (Dr.Thimappa)    06/22/2016 -  Anti-estrogen oral therapy   Letrozole 2.5 mg daily   11/01/2018 Relapse/Recurrence   Patient palpated lump in the UIQ at surgical scar of reconstructed right breast. US of the right breast showed a 1.8cm mass suspicious for recurrent breast cancer. Biopsy showed grade 2 invasive ductal carcinoma, HER-2 negative (1+), ER 90%, PR negative, Ki67 40%.   11/11/2018 Oncotype testing   Oncotype score 52: Greater than 39% risk of distant recurrence with hormone therapy alone, chemo benefit greater than 15%   11/30/2018 Surgery   Right lumpectomy [name]): IDC, grade 3, 2.1cm, perineural invasion present, carcinoma present focally at the posterior margin and involving skeletal muscle.   11/30/2018 Cancer Staging   Staging form: Breast, AJCC 8th Edition - Pathologic stage from 11/30/2018: Stage IIA (pT2, pN0, cM0, G3, ER+, PR-, HER2-) - Signed by Gardenia Phlegm, NP on 12/14/2018   12/23/2018 - 04/07/2019 Chemotherapy   Taxotere and Cytoxan x6 cycles   05/18/2019 - 06/29/2019 Radiation Therapy   Adjuvant radiation   10/09/2019 Miscellaneous   Abemaciclib 300 mg daily started, decrease to 150 mg daily 10/15/2019 due to abdominal pain     CHIEF COMPLIANT: Follow-up of right breast cancer on anastrozole and abemaciclib  INTERVAL HISTORY: Patricia Chandler is a 65 y.o. with above-mentioned history of recurrent right breast cancer who underwent a lumpectomy, adjuvant chemotherapy, radiation, and is currently on anastrozole and abemaciclib. Mammogram on 12/13/2020 showed no evidence of malignancy.  She presents to the clinic today for follow-up.  Patient is tolerating abemaciclib reasonably well.  She however has fatigue for the past week.  Diarrhea is under good control.  She is going to undergo breast additional surgery for reconstruction with potential fat grafting on September 15.  ALLERGIES:  has No Known Allergies.  MEDICATIONS:  Current Outpatient  Medications  Medication Sig Dispense Refill   abemaciclib (VERZENIO) 100 MG tablet Take 1 tablet (100 mg total) by mouth 2 (two) times daily. Swallow tablets whole. Do not chew, crush, or split tablets before swallowing. 60 tablet 6   anastrozole (ARIMIDEX) 1 MG tablet TAKE 1 TABLET BY MOUTH DAILY 90 tablet 0   Biotin 1 MG CAPS Take by mouth.     calcium-vitamin D (OSCAL WITH D) 500-200 MG-UNIT tablet Take 1 tablet by mouth daily with breakfast.     denosumab (PROLIA) 60 MG/ML SOLN injection Inject 60 mg into the skin every 6 (six) months. Administer in upper arm, thigh, or abdomen     Lactobacillus (PROBIOTIC ACIDOPHILUS PO) Take 1 capsule by mouth daily.      omeprazole (PRILOSEC) 20 MG capsule Take 20 mg by mouth daily as needed (heartburn).     triamcinolone (NASACORT) 55 MCG/ACT AERO nasal inhaler Place into the nose.     Turmeric POWD Take by mouth.     zolpidem (AMBIEN) 5 MG tablet TAKE 1 TABLET BY MOUTH EACH NIGHT AT BEDTIME 30 tablet 3   No current facility-administered medications for this visit.    PHYSICAL EXAMINATION: ECOG PERFORMANCE STATUS: 1 - Symptomatic but completely ambulatory  Vitals:   12/18/20 1006  BP: 115/76  Pulse: 88  Resp: 18  Temp: 97.9 F (36.6 C)  SpO2: 100%   Filed Weights   12/18/20 1006  Weight: 153 lb 4.8 oz (69.5 kg)      LABORATORY DATA:  I have reviewed the data as listed CMP Latest Ref Rng & Units 12/18/2020 10/29/2020 09/17/2020  Glucose 70 - 99 mg/dL 85 71 90  BUN 8 - 23 mg/dL 19 21 27(H)  Creatinine 0.44 - 1.00 mg/dL 0.89 0.87 0.81  Sodium 135 - 145 mmol/L 140 138 138  Potassium 3.5 - 5.1 mmol/L 4.5 4.1 3.8  Chloride 98 - 111 mmol/L 106 103 105  CO2 22 - 32 mmol/L _0 Calcium 8.9 - 10.3 mg/dL 9.9 9.9 8.8(L)  Total Protein 6.5 - 8.1 g/dL 6.6 6.8 6.3(L)  Total Bilirubin 0.3 - 1.2 mg/dL 0.7 0.5 0.5  Alkaline Phos 38 - 126 U/L 51 50 46  AST 15 - 41 U/L _1 ALT 0 - 44 U/L _2 Lab Results  Component Value Date    WBC 4.6 12/18/2020   HGB 12.4 12/18/2020   HCT 36.2 12/18/2020   MCV 97.8 12/18/2020   PLT 233 12/18/2020   NEUTROABS 3.4 12/18/2020    ASSESSMENT & PLAN:  Breast cancer of upper-inner quadrant of right female breast ([facility]) Right breast biopsy 2:00 6 cm from nipple: Grade 2-3 invasive ductal cancer, ER 90%, PR 2%, Ki-67 80%, HER-2 positive ratio 2.07; right biopsy 2:00 4 cm from nipple: IDC with DCIS, ER 95%, PR 10%, Ki-67 90%, HER-2 negative ratio 1.27 Breast MRI 12/18/2015: 3 enhancing masses in the upper inner quadrant right breast spanning an area 3.7 cm (1.7 cm, 1 cm, 1.4 cm) no lymph node enlargement Clinical stage: T2N0 (stage 2A)   Prior treatment summary: 1. Genetic counseling: Normal  did not reveal any abnormal mutations 2. Neoadjuvant chemotherapy. [facility] Perjeta 6 cycles followed by Herceptin And Perjeta maintenance completed 10/20/2016) from 12/31/15 to 04/15/16 3. Right mastectomy with reconstruction: 05/19/2016:Right mastectomy: IDC grade 3, 1.3 cm, low-grade DCIS, LVIDS present, margins negative, 0/1 lymph node negative, ypT1CypN0 stage IA; repeat HER-2 negative ratio 1.41 4. Adjuvant antiestrogen therapy With letrozole 2.5 mg daily started March 2018 -------------------------------------------------------------------------------------------------------------------------------------------------- Relapsed breast cancer: Subcutaneous disease: 1.8 cm measured by ultrasound Biopsy revealed grade 2-3 IDC ER 90% PR 0% HER-2 negative Ki-67 40% 11/30/2018 right lumpectomy [name]): IDC, grade 3, 2.1cm, perineural invasion present, carcinoma present focally at the posterior margin and involving skeletal muscle.   Oncotype score 52: Greater than 39% risk of distant recurrence with hormone therapy alone, chemo benefit greater than 15%   Recommendation: 1.  Surgery to remove the tumor 11/30/2018 2.  Adjuvant chemotherapy with Taxotere and Cytoxan x6 cycles  12/30/2018 -04/07/2019 3.   Adjuvant radiation started 05/19/2019-06/29/2019 4.  Current treatment: Adjuvant antiestrogen therapy with anastrozole started 05/26/2019 with abemaciclib (abemaciclib is for 2 years) ------------------------------------------------------------------------------------------------------------------------------------ Anastrozole toxicities: None Abemaciclib toxicities: 1. Abdominal pain: no further problems in spite of changing the dosage to 100 mg twice a day from 08/19/2020 2. Occasional loose stools 3.  Fatigue   She has breast reconstruction surgery September 15.  So therefore she will have to stop her Zetia for 2 weeks before that and possibly for 2 weeks after.   Return to clinic in 2 months for labs and follow-up.   No orders of the defined types were placed in this encounter.  The patient has a good understanding of the overall plan. she agrees with it. she will call with any problems that may develop before the next visit here.  Total time spent: 20 mins including face to face time and time spent for planning, charting and coordination of care  Rulon Eisenmenger, MD, MPH 12/18/2020  I, Thana Ates, am acting as scribe for Dr. Nicholas Lose.  I have reviewed the above documentation for accuracy and completeness, and I agree with the above.

## 2020-12-18 ENCOUNTER — Other Ambulatory Visit: Payer: Self-pay

## 2020-12-18 ENCOUNTER — Inpatient Hospital Stay: Payer: Medicare Other | Attending: Hematology and Oncology

## 2020-12-18 ENCOUNTER — Inpatient Hospital Stay: Payer: Medicare Other | Admitting: Hematology and Oncology

## 2020-12-18 DIAGNOSIS — R5383 Other fatigue: Secondary | ICD-10-CM | POA: Diagnosis not present

## 2020-12-18 DIAGNOSIS — Z9221 Personal history of antineoplastic chemotherapy: Secondary | ICD-10-CM | POA: Diagnosis not present

## 2020-12-18 DIAGNOSIS — R109 Unspecified abdominal pain: Secondary | ICD-10-CM | POA: Diagnosis not present

## 2020-12-18 DIAGNOSIS — R197 Diarrhea, unspecified: Secondary | ICD-10-CM | POA: Insufficient documentation

## 2020-12-18 DIAGNOSIS — C50211 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 female breast: Secondary | ICD-10-CM

## 2020-12-18 DIAGNOSIS — Z170 Estrogen receptor positive status [ER+]: Secondary | ICD-10-CM

## 2020-12-18 DIAGNOSIS — Z923 Personal history of irradiation: Secondary | ICD-10-CM | POA: Insufficient documentation

## 2020-12-18 DIAGNOSIS — Z79811 Long term (current) use of aromatase inhibitors: Secondary | ICD-10-CM | POA: Diagnosis not present

## 2020-12-18 DIAGNOSIS — E7800 Pure hypercholesterolemia, unspecified: Secondary | ICD-10-CM | POA: Diagnosis not present

## 2020-12-18 LAB — CMP (CANCER CENTER ONLY)
ALT: 12 U/L (ref 0–44)
AST: 15 U/L (ref 15–41)
Albumin: 3.9 g/dL (ref 3.5–5.0)
Alkaline Phosphatase: 51 U/L (ref 38–126)
Anion gap: 9 (ref 5–15)
BUN: 19 mg/dL (ref 8–23)
CO2: 25 mmol/L (ref 22–32)
Calcium: 9.9 mg/dL (ref 8.9–10.3)
Chloride: 106 mmol/L (ref 98–111)
Creatinine: 0.89 mg/dL (ref 0.44–1.00)
GFR, Estimated: >60 mL/min (ref >60)
Glucose, Bld: 85 mg/dL (ref 70–99)
Potassium: 4.5 mmol/L (ref 3.5–5.1)
Sodium: 140 mmol/L (ref 135–145)
Total Bilirubin: 0.7 mg/dL (ref 0.3–1.2)
Total Protein: 6.6 g/dL (ref 6.5–8.1)

## 2020-12-18 LAB — CBC WITH DIFFERENTIAL (CANCER CENTER ONLY)
Abs Immature Granulocytes: 0.02 10*3/uL (ref 0.00–0.07)
Basophils Absolute: 0 10*3/uL (ref 0.0–0.1)
Basophils Relative: 1 %
Eosinophils Absolute: 0.1 10*3/uL (ref 0.0–0.5)
Eosinophils Relative: 3 %
HCT: 36.2 % (ref 36.0–46.0)
Hemoglobin: 12.4 g/dL (ref 12.0–15.0)
Immature Granulocytes: 0 %
Lymphocytes Relative: 14 %
Lymphs Abs: 0.6 10*3/uL — ABNORMAL LOW (ref 0.7–4.0)
MCH: 33.5 pg (ref 26.0–34.0)
MCHC: 34.3 g/dL (ref 30.0–36.0)
MCV: 97.8 fL (ref 80.0–100.0)
Monocytes Absolute: 0.3 10*3/uL (ref 0.1–1.0)
Monocytes Relative: 7 %
Neutro Abs: 3.4 10*3/uL (ref 1.7–7.7)
Neutrophils Relative %: 75 %
Platelet Count: 233 10*3/uL (ref 150–400)
RBC: 3.7 MIL/uL — ABNORMAL LOW (ref 3.87–5.11)
RDW: 13.4 % (ref 11.5–15.5)
WBC Count: 4.6 10*3/uL (ref 4.0–10.5)
nRBC: 0 % (ref 0.0–0.2)

## 2020-12-18 NOTE — Assessment & Plan Note (Signed)
Right breast biopsy 2:00 6 cm from nipple: Grade 2-3 invasive ductal cancer, ER 90%, PR 2%, Ki-67 80%, HER-2 positive ratio 2.07; right biopsy 2:00 4 cm from nipple: IDC with DCIS, ER 95%, PR 10%, Ki-67 90%, HER-2 negative ratio 1.27 Breast MRI08/30/2017: 3 enhancing masses in the upper inner quadrant right breast spanning an area 3.7 cm (1.7 cm, 1 cm, 1.4 cm) no lymph node enlargement Clinical stage: T2N0 (stage 2A)  Prior treatment summary: 1. Genetic counseling: Normal did not reveal any abnormal mutations 2. Neoadjuvant chemotherapy. [facility] Perjeta 6 cycles followed by Herceptin And Perjetamaintenance completed 10/20/2016) from 12/31/15 to 04/15/16 3. Right mastectomy with reconstruction: 05/19/2016:Right mastectomy: IDC grade 3, 1.3 cm, low-grade DCIS, LVIDS present, margins negative, 0/1 lymph node negative, ypT1CypN0 stage IA; repeat HER-2 negative ratio 1.41 4. Adjuvant antiestrogen therapy With letrozole 2.5 mg daily started March 2018 -------------------------------------------------------------------------------------------------------------------------------------------------- Relapsed breast cancer: Subcutaneous disease: 1.8 cm measured by ultrasound Biopsy revealed grade 2-3 IDC ER 90% PR 0% HER-2 negative Ki-67 40% 11/30/2018 right lumpectomy [name]): IDC, grade 3, 2.1cm, perineural invasion present, carcinoma present focally at the posterior margin and involving skeletal muscle. Oncotype score 52: Greater than 39% risk of distant recurrence with hormone therapy alone, chemo benefit greater than 15%  Recommendation: 1.Surgery to remove the tumor8/03/2019 2.Adjuvant chemotherapy withTaxotere and Cytoxan x6 cycles 12/30/2018 -04/07/2019 3.Adjuvant radiationstarted 05/19/2019-06/29/2019 4.Current treatment: Adjuvant antiestrogen therapywith anastrozole started 05/26/2019 with abemaciclib (abemaciclib is for 2  years) ------------------------------------------------------------------------------------------------------------------------------------ Anastrozole toxicities:None Abemaciclib toxicities: 1. Abdominal pain: no further problems in spite of changing the dosage to 100 mg twice a day from 08/19/2020 2. Occasional loose stools 3. Decreased WBC count: Monitoring closely. Instructed her to drink more water because it is summer and she spends a lot of time outdoors. We discussed the role of second COVID booster.  She will likely receive it.  Return to clinic in 6 weeks for labs only

## 2020-12-19 LAB — LIPID PANEL
Cholesterol: 217 mg/dL — ABNORMAL HIGH (ref 0–200)
HDL: 104 mg/dL (ref >40)
LDL Cholesterol: 103 mg/dL — ABNORMAL HIGH (ref 0–99)
Total CHOL/HDL Ratio: 2.1 RATIO
Triglycerides: 51 mg/dL (ref <150)
VLDL: 10 mg/dL (ref 0–40)

## 2021-01-03 DIAGNOSIS — N641 Fat necrosis of breast: Secondary | ICD-10-CM | POA: Diagnosis not present

## 2021-01-03 DIAGNOSIS — L905 Scar conditions and fibrosis of skin: Secondary | ICD-10-CM | POA: Diagnosis not present

## 2021-01-03 DIAGNOSIS — M958 Other specified acquired deformities of musculoskeletal system: Secondary | ICD-10-CM | POA: Diagnosis not present

## 2021-01-03 DIAGNOSIS — N650 Deformity of reconstructed breast: Secondary | ICD-10-CM | POA: Diagnosis not present

## 2021-01-03 DIAGNOSIS — L598 Other specified disorders of the skin and subcutaneous tissue related to radiation: Secondary | ICD-10-CM | POA: Diagnosis not present

## 2021-01-03 DIAGNOSIS — Z9221 Personal history of antineoplastic chemotherapy: Secondary | ICD-10-CM | POA: Diagnosis not present

## 2021-01-03 DIAGNOSIS — Z86718 Personal history of other venous thrombosis and embolism: Secondary | ICD-10-CM | POA: Diagnosis not present

## 2021-01-03 DIAGNOSIS — Z853 Personal history of malignant neoplasm of breast: Secondary | ICD-10-CM | POA: Diagnosis not present

## 2021-01-03 DIAGNOSIS — Z9011 Acquired absence of right breast and nipple: Secondary | ICD-10-CM | POA: Diagnosis not present

## 2021-01-03 DIAGNOSIS — N651 Disproportion of reconstructed breast: Secondary | ICD-10-CM | POA: Diagnosis not present

## 2021-01-03 DIAGNOSIS — N6489 Other specified disorders of breast: Secondary | ICD-10-CM | POA: Diagnosis not present

## 2021-01-03 DIAGNOSIS — K219 Gastro-esophageal reflux disease without esophagitis: Secondary | ICD-10-CM | POA: Diagnosis not present

## 2021-01-03 DIAGNOSIS — Z79899 Other long term (current) drug therapy: Secondary | ICD-10-CM | POA: Diagnosis not present

## 2021-01-17 DIAGNOSIS — Z9011 Acquired absence of right breast and nipple: Secondary | ICD-10-CM | POA: Diagnosis not present

## 2021-01-17 DIAGNOSIS — Z9889 Other specified postprocedural states: Secondary | ICD-10-CM | POA: Diagnosis not present

## 2021-01-17 DIAGNOSIS — C50211 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 female breast: Secondary | ICD-10-CM | POA: Diagnosis not present

## 2021-01-24 DIAGNOSIS — C50211 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 female breast: Secondary | ICD-10-CM | POA: Diagnosis not present

## 2021-01-24 DIAGNOSIS — Z9889 Other specified postprocedural states: Secondary | ICD-10-CM | POA: Diagnosis not present

## 2021-01-24 DIAGNOSIS — Z9011 Acquired absence of right breast and nipple: Secondary | ICD-10-CM | POA: Diagnosis not present

## 2021-01-30 DIAGNOSIS — L813 Cafe au lait spots: Secondary | ICD-10-CM | POA: Diagnosis not present

## 2021-01-30 DIAGNOSIS — I788 Other diseases of capillaries: Secondary | ICD-10-CM | POA: Diagnosis not present

## 2021-01-30 DIAGNOSIS — D2261 Melanocytic nevi of right upper limb, including shoulder: Secondary | ICD-10-CM | POA: Diagnosis not present

## 2021-01-30 DIAGNOSIS — D1801 Hemangioma of skin and subcutaneous tissue: Secondary | ICD-10-CM | POA: Diagnosis not present

## 2021-01-30 DIAGNOSIS — D225 Melanocytic nevi of trunk: Secondary | ICD-10-CM | POA: Diagnosis not present

## 2021-01-30 DIAGNOSIS — L821 Other seborrheic keratosis: Secondary | ICD-10-CM | POA: Diagnosis not present

## 2021-01-31 ENCOUNTER — Other Ambulatory Visit: Payer: Self-pay | Admitting: *Deleted

## 2021-01-31 ENCOUNTER — Other Ambulatory Visit: Payer: Self-pay | Admitting: Hematology and Oncology

## 2021-01-31 DIAGNOSIS — Z170 Estrogen receptor positive status [ER+]: Secondary | ICD-10-CM

## 2021-01-31 DIAGNOSIS — C50211 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 female breast: Secondary | ICD-10-CM

## 2021-02-03 ENCOUNTER — Inpatient Hospital Stay: Payer: Medicare Other | Attending: Hematology and Oncology

## 2021-02-03 ENCOUNTER — Inpatient Hospital Stay: Payer: Medicare Other | Admitting: Hematology and Oncology

## 2021-02-03 NOTE — Assessment & Plan Note (Deleted)
Right breast biopsy 2:00 6 cm from nipple: Grade 2-3 invasive ductal cancer, ER 90%, PR 2%, Ki-67 80%, HER-2 positive ratio 2.07; right biopsy 2:00 4 cm from nipple: IDC with DCIS, ER 95%, PR 10%, Ki-67 90%, HER-2 negative ratio 1.27 Breast MRI08/30/2017: 3 enhancing masses in the upper inner quadrant right breast spanning an area 3.7 cm (1.7 cm, 1 cm, 1.4 cm) no lymph node enlargement Clinical stage: T2N0 (stage 2A)  Prior treatment summary: 1. Genetic counseling: Normal did not reveal any abnormal mutations 2. Neoadjuvant chemotherapy. [facility] Perjeta 6 cycles followed by Herceptin And Perjetamaintenance completed 10/20/2016) from 12/31/15 to 04/15/16 3. Right mastectomy with reconstruction: 05/19/2016:Right mastectomy: IDC grade 3, 1.3 cm, low-grade DCIS, LVIDS present, margins negative, 0/1 lymph node negative, ypT1CypN0 stage IA; repeat HER-2 negative ratio 1.41 4. Adjuvant antiestrogen therapy With letrozole 2.5 mg daily started March 2018 -------------------------------------------------------------------------------------------------------------------------------------------------- Relapsed breast cancer: Subcutaneous disease: 1.8 cm measured by ultrasound Biopsy revealed grade 2-3 IDC ER 90% PR 0% HER-2 negative Ki-67 40% 11/30/2018 right lumpectomy [name]): IDC, grade 3, 2.1cm, perineural invasion present, carcinoma present focally at the posterior margin and involving skeletal muscle. Oncotype score 52: Greater than 39% risk of distant recurrence with hormone therapy alone, chemo benefit greater than 15%  Recommendation: 1.Surgery to remove the tumor8/03/2019 2.Adjuvant chemotherapy withTaxotere and Cytoxan x6 cycles 12/30/2018 -04/07/2019 3.Adjuvant radiationstarted 05/19/2019-06/29/2019 4.Current treatment: Adjuvant antiestrogen therapywith anastrozole started 05/26/2019 with abemaciclib (abemaciclib is for 2  years) ------------------------------------------------------------------------------------------------------------------------------------ Anastrozole toxicities:None Abemaciclib toxicities: 1. Abdominal pain:no further problems in spite of changing the dosageto 100 mg twice a day from 08/19/2020 2. Occasional loose stools 3.  Fatigue   She had breast reconstruction surgery September 15.   Return to clinic in 2 months for labs and follow-up.

## 2021-02-04 ENCOUNTER — Encounter: Payer: Self-pay | Admitting: Hematology and Oncology

## 2021-02-12 DIAGNOSIS — R350 Frequency of micturition: Secondary | ICD-10-CM | POA: Diagnosis not present

## 2021-02-17 NOTE — Progress Notes (Signed)
Injury is  Patient Care Team: [name], MD as PCP - General (Family Medicine) [name], RN as Oncology Nurse Navigator Mauro Kaufmann, RN as Oncology Nurse Navigator  DIAGNOSIS:    ICD-10-CM   1. 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 breast in female, estrogen receptor positive ([place])  C50.211    Z17.0       SUMMARY OF ONCOLOGIC HISTORY: Oncology History  Breast cancer of upper-inner quadrant of right female breast ([place])  12/05/2015 Mammogram   Right breast mass 2:00 4 cm from nipple: 1.7 cm, T1c N0 stage IA; right breast 2:00 6 cm from nipple 1.3 cm mass, 7 mm satellite nodule between the 2 masses, T1 cN0 stage IA   12/06/2015 Initial Diagnosis   Right breast biopsy 2:00 6 cm from nipple: Grade 2-3 invasive ductal cancer, ER 90%, PR 2%, Ki-67 80%, HER-2 positive ratio 2.07; right biopsy 2:00 4 cm from nipple: IDC with DCIS, ER 95%, PR 10%, Ki-67 90%, HER-2 negative ratio 1.27   12/18/2015 Breast MRI   3 enhancing masses in the upper inner quadrant right breast spanning an area 3.7 cm (1.7 cm, 1 cm, 1.4 cm) no lymph node enlargement   12/31/2015 - 04/15/2016 Neo-Adjuvant Chemotherapy   [facility] Perjeta 6 cycles followed by Herceptin, Perjeta maintenance for 1 year completed 10/20/2016   01/08/2016 - 01/12/2016 Hospital Admission   Neutropenic fever hospitalization (because patient did not receive Neulasta with cycle 1)   01/10/2016 Miscellaneous   Genetic testing was normal did not reveal any mutations   04/16/2016 Breast MRI   Near CR to therapy. Previous 2 biopsied massses are no longer seen. Third satellite lesion is smaller from 10 mm to 5.3 mm, no abnormal LN.   05/19/2016 Surgery   Right mastectomy: IDC grade 3, 1.3 cm, low-grade DCIS, LVIDS present, margins negative, 0/1 lymph node negative, ypT1CypN0 stage IA; repeat HER-2 negative ratio 1.41   05/19/2016 Surgery   Right breast reconstruction with tissue expander. Acellular dermis for breast  reconstruction (Dr.Thimappa)    06/22/2016 -  Anti-estrogen oral therapy   Letrozole 2.5 mg daily   11/01/2018 Relapse/Recurrence   Patient palpated lump in the UIQ at surgical scar of reconstructed right breast. US of the right breast showed a 1.8cm mass suspicious for recurrent breast cancer. Biopsy showed grade 2 invasive ductal carcinoma, HER-2 negative (1+), ER 90%, PR negative, Ki67 40%.   11/11/2018 Oncotype testing   Oncotype score 52: Greater than 39% risk of distant recurrence with hormone therapy alone, chemo benefit greater than 15%   11/30/2018 Surgery   Right lumpectomy [name]): IDC, grade 3, 2.1cm, perineural invasion present, carcinoma present focally at the posterior margin and involving skeletal muscle.   11/30/2018 Cancer Staging   Staging form: Breast, AJCC 8th Edition - Pathologic stage from 11/30/2018: Stage IIA (pT2, pN0, cM0, G3, ER+, PR-, HER2-) - Signed by Gardenia Phlegm, NP on 12/14/2018    12/23/2018 - 04/07/2019 Chemotherapy   Taxotere and Cytoxan x6 cycles   05/18/2019 - 06/29/2019 Radiation Therapy   Adjuvant radiation   10/09/2019 Miscellaneous   Abemaciclib 300 mg daily started, decrease to 150 mg daily 10/15/2019 due to abdominal pain     CHIEF COMPLIANT: Follow-up of right breast cancer on anastrozole and abemaciclib  INTERVAL HISTORY: Patricia Chandler is a 65 y.o. with above-mentioned history of recurrent right breast cancer who underwent a lumpectomy, adjuvant chemotherapy, radiation, and is currently on anastrozole and abemaciclib. She presents to the clinic  today for follow-up.  She is completed phase 2 of her breast reconstruction and did very well from it.  She was off of Verzenio for 3 weeks because of that.  Her energy levels have recovered with the break.  Does not have any diarrhea.  ALLERGIES:  has No Known Allergies.  MEDICATIONS:  Current Outpatient Medications  Medication Sig Dispense Refill   abemaciclib (VERZENIO)  100 MG tablet Take 1 tablet (100 mg total) by mouth 2 (two) times daily. Swallow tablets whole. Do not chew, crush, or split tablets before swallowing. 60 tablet 6   anastrozole (ARIMIDEX) 1 MG tablet TAKE 1 TABLET BY MOUTH DAILY 90 tablet 0   Biotin 1 MG CAPS Take by mouth.     calcium-vitamin D (OSCAL WITH D) 500-200 MG-UNIT tablet Take 1 tablet by mouth daily with breakfast.     denosumab (PROLIA) 60 MG/ML SOLN injection Inject 60 mg into the skin every 6 (six) months. Administer in upper arm, thigh, or abdomen     Lactobacillus (PROBIOTIC ACIDOPHILUS PO) Take 1 capsule by mouth daily.      omeprazole (PRILOSEC) 20 MG capsule Take 20 mg by mouth daily as needed (heartburn).     triamcinolone (NASACORT) 55 MCG/ACT AERO nasal inhaler Place into the nose.     Turmeric POWD Take by mouth.     zolpidem (AMBIEN) 5 MG tablet TAKE 1 TABLET BY MOUTH EACH NIGHT AT BEDTIME 30 tablet 3   No current facility-administered medications for this visit.    PHYSICAL EXAMINATION: ECOG PERFORMANCE STATUS: 1 - Symptomatic but completely ambulatory  There were no vitals filed for this visit. There were no vitals filed for this visit.    LABORATORY DATA:  I have reviewed the data as listed CMP Latest Ref Rng & Units 12/18/2020 10/29/2020 09/17/2020  Glucose 70 - 99 mg/dL 85 71 90  BUN 8 - 23 mg/dL 19 21 27(H)  Creatinine 0.44 - 1.00 mg/dL 0.89 0.87 0.81  Sodium 135 - 145 mmol/L 140 138 138  Potassium 3.5 - 5.1 mmol/L 4.5 4.1 3.8  Chloride 98 - 111 mmol/L 106 103 105  CO2 22 - 32 mmol/L _0 Calcium 8.9 - 10.3 mg/dL 9.9 9.9 8.8(L)  Total Protein 6.5 - 8.1 g/dL 6.6 6.8 6.3(L)  Total Bilirubin 0.3 - 1.2 mg/dL 0.7 0.5 0.5  Alkaline Phos 38 - 126 U/L 51 50 46  AST 15 - 41 U/L _1 ALT 0 - 44 U/L _2 Lab Results  Component Value Date   WBC 4.6 12/18/2020   HGB 12.4 12/18/2020   HCT 36.2 12/18/2020   MCV 97.8 12/18/2020   PLT 233 12/18/2020   NEUTROABS 3.4 12/18/2020    ASSESSMENT  & PLAN:  Breast cancer of upper-inner quadrant of right female breast (HCC) Right breast biopsy 2:00 6 cm from nipple: Grade 2-3 invasive ductal cancer, ER 90%, PR 2%, Ki-67 80%, HER-2 positive ratio 2.07; right biopsy 2:00 4 cm from nipple: IDC with DCIS, ER 95%, PR 10%, Ki-67 90%, HER-2 negative ratio 1.27 Breast MRI 12/18/2015: 3 enhancing masses in the upper inner quadrant right breast spanning an area 3.7 cm (1.7 cm, 1 cm, 1.4 cm) no lymph node enlargement Clinical stage: T2N0 (stage 2A)   Prior treatment summary: 1. Genetic counseling: Normal did not reveal any abnormal mutations 2. Neoadjuvant chemotherapy. [facility] Perjeta 6 cycles followed by Herceptin And Perjeta maintenance completed 10/20/2016) from 12/31/15 to 04/15/16 3. Right  mastectomy with reconstruction: 05/19/2016:Right mastectomy: IDC grade 3, 1.3 cm, low-grade DCIS, LVIDS present, margins negative, 0/1 lymph node negative, ypT1CypN0 stage IA; repeat HER-2 negative ratio 1.41 4. Adjuvant antiestrogen therapy With letrozole 2.5 mg daily started March 2018 -------------------------------------------------------------------------------------------------------------------------------------------------- Relapsed breast cancer: Subcutaneous disease: 1.8 cm measured by ultrasound Biopsy revealed grade 2-3 IDC ER 90% PR 0% HER-2 negative Ki-67 40% 11/30/2018 right lumpectomy [name]): IDC, grade 3, 2.1cm, perineural invasion present, carcinoma present focally at the posterior margin and involving skeletal muscle.   Oncotype score 52: Greater than 39% risk of distant recurrence with hormone therapy alone, chemo benefit greater than 15%   Recommendation: 1.  Surgery to remove the tumor 11/30/2018 2.  Adjuvant chemotherapy with Taxotere and Cytoxan x6 cycles  12/30/2018 -04/07/2019 3.  Adjuvant radiation started 05/19/2019-06/29/2019 4.  Current treatment: Adjuvant antiestrogen therapy with anastrozole started 05/26/2019 with abemaciclib started  June 2021 (abemaciclib is for 2 years).  We might extend abemaciclib a few more months because of gaps in her treatment. ------------------------------------------------------------------------------------------------------------------------------------ Anastrozole toxicities: None Abemaciclib toxicities: 1. Abdominal pain: no further problems in spite of changing the dosage to 100 mg twice a day from 08/19/2020 2. Occasional loose stools 3.  Fatigue   We briefly discussed the role of circulating tumor DNA.  She had breast reconstruction surgery September 15.    Return to clinic in 2 months for labs and follow-up.    No orders of the defined types were placed in this encounter.  The patient has a good understanding of the overall plan. she agrees with it. she will call with any problems that may develop before the next visit here.  Total time spent: 30 mins including face to face time and time spent for planning, charting and coordination of care  Rulon Eisenmenger, MD, MPH 02/18/2021  I, Thana Ates, am acting as scribe for Dr. Nicholas Lose.  I have reviewed the above documentation for accuracy and completeness, and I agree with the above.

## 2021-02-17 NOTE — Assessment & Plan Note (Signed)
Right breast biopsy 2:00 6 cm from nipple: Grade 2-3 invasive ductal cancer, ER 90%, PR 2%, Ki-67 80%, HER-2 positive ratio 2.07; right biopsy 2:00 4 cm from nipple: IDC with DCIS, ER 95%, PR 10%, Ki-67 90%, HER-2 negative ratio 1.27 Breast MRI08/30/2017: 3 enhancing masses in the upper inner quadrant right breast spanning an area 3.7 cm (1.7 cm, 1 cm, 1.4 cm) no lymph node enlargement Clinical stage: T2N0 (stage 2A)  Prior treatment summary: 1. Genetic counseling: Normal did not reveal any abnormal mutations 2. Neoadjuvant chemotherapy. [facility] Perjeta 6 cycles followed by Herceptin And Perjetamaintenance completed 10/20/2016) from 12/31/15 to 04/15/16 3. Right mastectomy with reconstruction: 05/19/2016:Right mastectomy: IDC grade 3, 1.3 cm, low-grade DCIS, LVIDS present, margins negative, 0/1 lymph node negative, ypT1CypN0 stage IA; repeat HER-2 negative ratio 1.41 4. Adjuvant antiestrogen therapy With letrozole 2.5 mg daily started March 2018 -------------------------------------------------------------------------------------------------------------------------------------------------- Relapsed breast cancer: Subcutaneous disease: 1.8 cm measured by ultrasound Biopsy revealed grade 2-3 IDC ER 90% PR 0% HER-2 negative Ki-67 40% 11/30/2018 right lumpectomy [name]): IDC, grade 3, 2.1cm, perineural invasion present, carcinoma present focally at the posterior margin and involving skeletal muscle. Oncotype score 52: Greater than 39% risk of distant recurrence with hormone therapy alone, chemo benefit greater than 15%  Recommendation: 1.Surgery to remove the tumor8/03/2019 2.Adjuvant chemotherapy withTaxotere and Cytoxan x6 cycles 12/30/2018 -04/07/2019 3.Adjuvant radiationstarted 05/19/2019-06/29/2019 4.Current treatment: Adjuvant antiestrogen therapywith anastrozole started 05/26/2019 with abemaciclib (abemaciclib is for 2  years) ------------------------------------------------------------------------------------------------------------------------------------ Anastrozole toxicities:None Abemaciclib toxicities: 1. Abdominal pain:no further problems in spite of changing the dosageto 100 mg twice a day from 08/19/2020 2. Occasional loose stools 3.  Fatigue   She had breast reconstruction surgery September 15.   Return to clinic in 2 months for labs and follow-up.

## 2021-02-18 ENCOUNTER — Telehealth: Payer: Self-pay

## 2021-02-18 ENCOUNTER — Other Ambulatory Visit: Payer: Self-pay | Admitting: Hematology and Oncology

## 2021-02-18 ENCOUNTER — Inpatient Hospital Stay: Payer: Medicare Other | Admitting: Hematology and Oncology

## 2021-02-18 ENCOUNTER — Inpatient Hospital Stay: Payer: Medicare Other | Attending: Hematology and Oncology

## 2021-02-18 ENCOUNTER — Other Ambulatory Visit: Payer: Self-pay

## 2021-02-18 DIAGNOSIS — Z79811 Long term (current) use of aromatase inhibitors: Secondary | ICD-10-CM | POA: Insufficient documentation

## 2021-02-18 DIAGNOSIS — Z9221 Personal history of antineoplastic chemotherapy: Secondary | ICD-10-CM | POA: Diagnosis not present

## 2021-02-18 DIAGNOSIS — C50211 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 female breast: Secondary | ICD-10-CM | POA: Insufficient documentation

## 2021-02-18 DIAGNOSIS — Z923 Personal history of irradiation: Secondary | ICD-10-CM | POA: Diagnosis not present

## 2021-02-18 DIAGNOSIS — Z170 Estrogen receptor positive status [ER+]: Secondary | ICD-10-CM

## 2021-02-18 DIAGNOSIS — Z9011 Acquired absence of right breast and nipple: Secondary | ICD-10-CM | POA: Insufficient documentation

## 2021-02-18 LAB — CBC WITH DIFFERENTIAL (CANCER CENTER ONLY)
Abs Immature Granulocytes: 0.02 10*3/uL (ref 0.00–0.07)
Basophils Absolute: 0 10*3/uL (ref 0.0–0.1)
Basophils Relative: 1 %
Eosinophils Absolute: 0.1 10*3/uL (ref 0.0–0.5)
Eosinophils Relative: 1 %
HCT: 33.9 % — ABNORMAL LOW (ref 36.0–46.0)
Hemoglobin: 11.9 g/dL — ABNORMAL LOW (ref 12.0–15.0)
Immature Granulocytes: 1 %
Lymphocytes Relative: 20 %
Lymphs Abs: 0.8 10*3/uL (ref 0.7–4.0)
MCH: 32.8 pg (ref 26.0–34.0)
MCHC: 35.1 g/dL (ref 30.0–36.0)
MCV: 93.4 fL (ref 80.0–100.0)
Monocytes Absolute: 0.3 10*3/uL (ref 0.1–1.0)
Monocytes Relative: 8 %
Neutro Abs: 2.7 10*3/uL (ref 1.7–7.7)
Neutrophils Relative %: 69 %
Platelet Count: 229 10*3/uL (ref 150–400)
RBC: 3.63 MIL/uL — ABNORMAL LOW (ref 3.87–5.11)
RDW: 12.7 % (ref 11.5–15.5)
WBC Count: 3.9 10*3/uL — ABNORMAL LOW (ref 4.0–10.5)
nRBC: 0 % (ref 0.0–0.2)

## 2021-02-18 LAB — CMP (CANCER CENTER ONLY)
ALT: 10 U/L (ref 0–44)
AST: 14 U/L — ABNORMAL LOW (ref 15–41)
Albumin: 3.9 g/dL (ref 3.5–5.0)
Alkaline Phosphatase: 53 U/L (ref 38–126)
Anion gap: 6 (ref 5–15)
BUN: 26 mg/dL — ABNORMAL HIGH (ref 8–23)
CO2: 27 mmol/L (ref 22–32)
Calcium: 9.1 mg/dL (ref 8.9–10.3)
Chloride: 104 mmol/L (ref 98–111)
Creatinine: 0.86 mg/dL (ref 0.44–1.00)
GFR, Estimated: >60 mL/min (ref >60)
Glucose, Bld: 101 mg/dL — ABNORMAL HIGH (ref 70–99)
Potassium: 3.8 mmol/L (ref 3.5–5.1)
Sodium: 137 mmol/L (ref 135–145)
Total Bilirubin: 0.3 mg/dL (ref 0.3–1.2)
Total Protein: 6.6 g/dL (ref 6.5–8.1)

## 2021-02-18 MED ORDER — ABEMACICLIB 100 MG PO TABS: 100.0000 mg | ORAL_TABLET | Freq: Two times a day (BID) | ORAL | 11 refills | Status: DC

## 2021-02-18 NOTE — Telephone Encounter (Signed)
Oral Oncology Patient Advocate Encounter  Met patient in [place] to complete a re-enrollment  application for [employer] Patient Assistance Program in an effort to reduce the patient's out of pocket expense for Verzenio to $0.    Application completed and faxed to [phone].   [employer] patient assistance phone number for follow up is [phone].   This encounter will be updated until final determination.   [place] Patient [place] Phone [phone] Fax [phone] 02/18/2021 2:49 PM

## 2021-02-21 NOTE — Telephone Encounter (Signed)
Patient is approved for Verzenio at no cost from [employer] 04/20/21-04/19/22  [place] uses [place] [facility] [place] Phone [phone] Fax [phone] 02/21/2021 7:08 AM

## 2021-03-10 DIAGNOSIS — J209 Acute bronchitis, unspecified: Secondary | ICD-10-CM | POA: Diagnosis not present

## 2021-03-31 DIAGNOSIS — C50211 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 female breast: Secondary | ICD-10-CM | POA: Diagnosis not present

## 2021-03-31 DIAGNOSIS — Z9889 Other specified postprocedural states: Secondary | ICD-10-CM | POA: Diagnosis not present

## 2021-03-31 DIAGNOSIS — Z9011 Acquired absence of right breast and nipple: Secondary | ICD-10-CM | POA: Diagnosis not present

## 2021-04-21 ENCOUNTER — Encounter: Payer: Self-pay | Admitting: Family Medicine

## 2021-04-21 DIAGNOSIS — J0100 Acute maxillary sinusitis, unspecified: Secondary | ICD-10-CM | POA: Diagnosis not present

## 2021-04-22 ENCOUNTER — Ambulatory Visit (INDEPENDENT_AMBULATORY_CARE_PROVIDER_SITE_OTHER): Payer: Medicare Other | Admitting: Family Medicine

## 2021-04-22 ENCOUNTER — Encounter: Payer: Self-pay | Admitting: Family Medicine

## 2021-04-22 ENCOUNTER — Other Ambulatory Visit: Payer: Self-pay

## 2021-04-22 ENCOUNTER — Ambulatory Visit (INDEPENDENT_AMBULATORY_CARE_PROVIDER_SITE_OTHER)
Admission: RE | Admit: 2021-04-22 | Discharge: 2021-04-22 | Disposition: A | Discharge status: Home / Self Care | Payer: Medicare Other | Source: Ambulatory Visit | Attending: Family Medicine | Admitting: Family Medicine

## 2021-04-22 VITALS — BP 122/80 | HR 126 | Temp 97.5°F | Ht 63.0 in | Wt 150.4 lb

## 2021-04-22 DIAGNOSIS — R051 Acute cough: Secondary | ICD-10-CM | POA: Diagnosis not present

## 2021-04-22 DIAGNOSIS — R509 Fever, unspecified: Secondary | ICD-10-CM | POA: Diagnosis not present

## 2021-04-22 DIAGNOSIS — R059 Cough, unspecified: Secondary | ICD-10-CM | POA: Diagnosis not present

## 2021-04-22 NOTE — Progress Notes (Signed)
Subjective:     Patient ID: Patricia Chandler, female    DOB: Jul 24, 1955, 66 y.o.   MRN: [id number]  Chief Complaint  Patient presents with   Cough    Started 04/14/2021, started 8  days ago  Negative covid test    Fever    Low grade fever    Nasal Congestion   Chest congestion   Fatigue    HPI-here w/husband Sick since 12/26. Cough, congestion, fatigue, low grade fever-100.7 last pm and prev night. A little sob this am.  Some d/c L>R.  Using compresses/wipes. Light-headed this am-hasn't eaten yet.  Green mucus nose and some chest..  taking guifenisin "fast max" Negative covid test yest and 2 days ago No edema/cp/vomiting.  No new diarrhea Got covid boosters, flu, pneumonia  Teledoc rx clarithromycin, symbicort, flonase  Also, was sick w/bronchitis in Nov and got steroids Health Maintenance Due  Topic Date Due   INFLUENZA VACCINE  11/18/2020    Past Medical History:  Diagnosis Date   Anemia    due to chemo   Anxiety    Breast cancer ([place]) dx'd 2017   right   Cardiomyopathy ([place])    mild, per cardiology note; due to Herceptin   Dental crowns present    Family history of adverse reaction to anesthesia    pt's daughter has hx. of post-op nausea   GERD (gastroesophageal reflux disease)    Heart murmur    History of breast cancer 2017   right   History of chemotherapy    finished chemo 04/15/2016   Personal history of chemotherapy    Personal history of radiation therapy    Right Breast Cancer   Rash 08/04/2016   under right arm - states recent tick bite; to see MD 08/05/2016   Recurrent breast cancer, right ([place]) dx'd 10/2018   Runny nose 06/06/2016   clear drainage, per pt.    Past Surgical History:  Procedure Laterality Date   AUGMENTATION MAMMAPLASTY Left    BREAST BIOPSY Right 11/30/2018   Procedure: RIGHT BREAST CHEST WALL RECURRENCE EXCISION;  Surgeon: Rolm Bookbinder, MD;  Location: [place];  Service: General;  Laterality: Right;   BREAST  ENHANCEMENT SURGERY Bilateral 2005   breast mass removal Right 11/30/2018   BREAST RECONSTRUCTION WITH PLACEMENT OF TISSUE EXPANDER AND FLEX HD (ACELLULAR HYDRATED DERMIS) Right 05/19/2016   Procedure: RIGHT BREAST RECONSTRUCTION WITH PLACEMENT OF TISSUE EXPANDER AND  ALLODERM.;  Surgeon: Irene Limbo, MD;  Location: [place];  Service: Plastics;  Laterality: Right;   COLONOSCOPY     COLONOSCOPY WITH PROPOFOL  10/02/2014   DEBRIDEMENT AND CLOSURE WOUND Right 06/08/2016   Procedure: DEBRIDEMENT RIGHT MASTECTOMY FLAP, TISSUE EXPANSION OF RIGHT CHEST;  Surgeon: Irene Limbo, MD;  Location: [place];  Service: Plastics;  Laterality: Right;   MASTECTOMY Right    MASTOPEXY Left 08/11/2016   Procedure: LEFT BREAST MASTOPEXY;  Surgeon: Irene Limbo, MD;  Location: [place];  Service: Plastics;  Laterality: Left;   NIPPLE SPARING MASTECTOMY/SENTINAL LYMPH NODE BIOPSY/RECONSTRUCTION/PLACEMENT OF TISSUE EXPANDER Right 05/19/2016   Procedure: RIGHT NIPPLE SPARING MASTECTOMY WITH SENTINAL LYMPH NODE BIOPSY WITH BLUE DYE INJECTION;  Surgeon: Rolm Bookbinder, MD;  Location: [place];  Service: General;  Laterality: Right;   PORT-A-CATH REMOVAL Right 04/26/2019   Procedure: REMOVAL PORT-A-CATH;  Surgeon: Rolm Bookbinder, MD;  Location: [place];  Service: General;  Laterality: Right;   PORTACATH PLACEMENT N/A 12/30/2015   Procedure: INSERTION  PORT-A-CATH WITH [place];  Surgeon: Rolm Bookbinder, MD;  Location: [place];  Service: General;  Laterality: N/A;   PORTACATH PLACEMENT N/A 11/30/2018   Procedure: INSERTION PORT-A-CATH WITH ULTRASOUND;  Surgeon: Rolm Bookbinder, MD;  Location: [place];  Service: General;  Laterality: N/A;   REDUCTION MAMMAPLASTY     REMOVAL OF TISSUE EXPANDER AND PLACEMENT OF IMPLANT Right 08/11/2016   Procedure: REMOVAL OF RIGHT  TISSUE EXPANDER AND PLACEMENT OF IMPLANT;  Surgeon: Irene Limbo,  MD;  Location: [place];  Service: Plastics;  Laterality: Right;    Outpatient Medications Prior to Visit  Medication Sig Dispense Refill   abemaciclib (VERZENIO) 100 MG tablet Take 1 tablet (100 mg total) by mouth 2 (two) times daily. Swallow tablets whole. Do not chew, crush, or split tablets before swallowing. 60 tablet 11   anastrozole (ARIMIDEX) 1 MG tablet TAKE 1 TABLET BY MOUTH DAILY 90 tablet 0   Biotin 1 MG CAPS Take by mouth.     calcium-vitamin D (OSCAL WITH D) 500-200 MG-UNIT tablet Take 1 tablet by mouth daily with breakfast.     clarithromycin (BIAXIN) 500 MG tablet Take 500 mg by mouth 2 (two) times daily.     denosumab (PROLIA) 60 MG/ML SOLN injection Inject 60 mg into the skin every 6 (six) months. Administer in upper arm, thigh, or abdomen     Lactobacillus (PROBIOTIC ACIDOPHILUS PO) Take 1 capsule by mouth daily.      omeprazole (PRILOSEC) 20 MG capsule Take 20 mg by mouth daily as needed (heartburn).     Turmeric POWD Take by mouth.     zolpidem (AMBIEN) 5 MG tablet TAKE 1 TABLET BY MOUTH EACH NIGHT AT BEDTIME 30 tablet 3   fluticasone (FLONASE) 50 MCG/ACT nasal spray Place into both nostrils.     triamcinolone (NASACORT) 55 MCG/ACT AERO nasal inhaler Place into the nose.     No facility-administered medications prior to visit.    No Known Allergies YIR:[id number] except as noted in HPI      Objective:     BP 122/80    Pulse (!) 126    Temp (!) 97.5 F (36.4 C) (Temporal)    Ht 5\' 3"  (1.6 m)    Wt 150 lb 6 oz (68.2 kg)    SpO2 97%    BMI 26.64 kg/m  Wt Readings from Last 3 Encounters:  04/22/21 150 lb 6 oz (68.2 kg)  02/18/21 153 lb (69.4 kg)  12/18/20 153 lb 4.8 oz (69.5 kg)        Gen: WDWN NAD wf HEENT: NCAT, conjunctiva not injected, sclera nonicteric NECK:  supple, no thyromegaly, no nodes, no carotid bruits CARDIAC: tachyRRR, S1S2+, no murmur. DP 2+B LUNGS: CTAB. No wheezes ABDOMEN:  BS+, soft, NTND, No HSM, no  masses EXT:  no edema MSK: no gross abnormalities.  NEURO: A&O x3.  CN II-XII intact.  PSYCH: normal mood. Good eye contact  Assessment & Plan:   Problem List Items Addressed This Visit   None Visit Diagnoses     Acute cough    -  Primary      Cough-poss early pneumonia.  On appropriate tx as above. Advised no decongestant as HR high.  Also, no albuterol or PO steroids.  Will do CXR given new fever and illness <78mo ago.  Worse, etc, to ER.    No orders of the defined types were placed in this encounter.   Wellington Hampshire., MD

## 2021-04-22 NOTE — Patient Instructions (Signed)
Meds have been sent the the pharmacy You can take tylenol for pain/fevers If worsening symptoms, let us know or go to the Emergency room   Avoid decongestants  Go to [place] for chest x-ray

## 2021-04-22 NOTE — Telephone Encounter (Signed)
Patient seen by Dr. Cherlynn Kaiser on 04/22/2021.

## 2021-04-23 ENCOUNTER — Encounter: Payer: Self-pay | Admitting: Family Medicine

## 2021-04-27 ENCOUNTER — Encounter: Payer: Self-pay | Admitting: Hematology and Oncology

## 2021-04-27 ENCOUNTER — Encounter: Payer: Self-pay | Admitting: Family Medicine

## 2021-04-27 NOTE — Assessment & Plan Note (Signed)
Right breast biopsy 2:00 6 cm from nipple: Grade 2-3 invasive ductal cancer, ER 90%, PR 2%, Ki-67 80%, HER-2 positive ratio 2.07; right biopsy 2:00 4 cm from nipple: IDC with DCIS, ER 95%, PR 10%, Ki-67 90%, HER-2 negative ratio 1.27 Breast MRI08/30/2017: 3 enhancing masses in the upper inner quadrant right breast spanning an area 3.7 cm (1.7 cm, 1 cm, 1.4 cm) no lymph node enlargement Clinical stage: T2N0 (stage 2A)  Prior treatment summary: 1. Genetic counseling: Normal did not reveal any abnormal mutations 2. Neoadjuvant chemotherapy. [facility] Perjeta 6 cycles followed by Herceptin And Perjetamaintenance completed 10/20/2016) from 12/31/15 to 04/15/16 3. Right mastectomy with reconstruction: 05/19/2016:Right mastectomy: IDC grade 3, 1.3 cm, low-grade DCIS, LVIDS present, margins negative, 0/1 lymph node negative, ypT1CypN0 stage IA; repeat HER-2 negative ratio 1.41 4. Adjuvant antiestrogen therapy With letrozole 2.5 mg daily started March 2018 -------------------------------------------------------------------------------------------------------------------------------------------------- Relapsed breast cancer: Subcutaneous disease: 1.8 cm measured by ultrasound Biopsy revealed grade 2-3 IDC ER 90% PR 0% HER-2 negative Ki-67 40% 11/30/2018 right lumpectomy [name]): IDC, grade 3, 2.1cm, perineural invasion present, carcinoma present focally at the posterior margin and involving skeletal muscle. Oncotype score 52: Greater than 39% risk of distant recurrence with hormone therapy alone, chemo benefit greater than 15%  Recommendation: 1.Surgery to remove the tumor8/03/2019 2.Adjuvant chemotherapy withTaxotere and Cytoxan x6 cycles 12/30/2018 -04/07/2019 3.Adjuvant radiationstarted 05/19/2019-06/29/2019 4.Current treatment: Adjuvant antiestrogen therapywith anastrozole started 05/26/2019 with abemaciclib (abemaciclib is for 2  years) ------------------------------------------------------------------------------------------------------------------------------------ Anastrozole toxicities:None Abemaciclib toxicities: 1. Abdominal pain:no further problems in spite of changing the dosageto 100 mg twice a day from 08/19/2020 2. Occasional loose stools 3.  Fatigue   She had breast reconstruction surgery September 15.   Return to clinic in 2 months for labs and follow-up.

## 2021-04-28 ENCOUNTER — Inpatient Hospital Stay: Payer: Medicare Other | Admitting: Hematology and Oncology

## 2021-04-28 ENCOUNTER — Inpatient Hospital Stay: Payer: Medicare Other | Attending: Hematology and Oncology

## 2021-04-28 ENCOUNTER — Other Ambulatory Visit: Payer: Self-pay

## 2021-04-28 DIAGNOSIS — C50211 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 female breast: Secondary | ICD-10-CM | POA: Insufficient documentation

## 2021-04-28 DIAGNOSIS — Z170 Estrogen receptor positive status [ER+]: Secondary | ICD-10-CM | POA: Diagnosis not present

## 2021-04-28 DIAGNOSIS — Z9011 Acquired absence of right breast and nipple: Secondary | ICD-10-CM | POA: Diagnosis not present

## 2021-04-28 DIAGNOSIS — Z9221 Personal history of antineoplastic chemotherapy: Secondary | ICD-10-CM | POA: Diagnosis not present

## 2021-04-28 DIAGNOSIS — Z923 Personal history of irradiation: Secondary | ICD-10-CM | POA: Insufficient documentation

## 2021-04-28 LAB — CBC WITH DIFFERENTIAL (CANCER CENTER ONLY)
Abs Immature Granulocytes: 0.03 10*3/uL (ref 0.00–0.07)
Basophils Absolute: 0.1 10*3/uL (ref 0.0–0.1)
Basophils Relative: 1 %
Eosinophils Absolute: 0.1 10*3/uL (ref 0.0–0.5)
Eosinophils Relative: 1 %
HCT: 34.9 % — ABNORMAL LOW (ref 36.0–46.0)
Hemoglobin: 11.8 g/dL — ABNORMAL LOW (ref 12.0–15.0)
Immature Granulocytes: 0 %
Lymphocytes Relative: 10 %
Lymphs Abs: 0.7 10*3/uL (ref 0.7–4.0)
MCH: 31.8 pg (ref 26.0–34.0)
MCHC: 33.8 g/dL (ref 30.0–36.0)
MCV: 94.1 fL (ref 80.0–100.0)
Monocytes Absolute: 0.3 10*3/uL (ref 0.1–1.0)
Monocytes Relative: 4 %
Neutro Abs: 5.8 10*3/uL (ref 1.7–7.7)
Neutrophils Relative %: 84 %
Platelet Count: 357 10*3/uL (ref 150–400)
RBC: 3.71 MIL/uL — ABNORMAL LOW (ref 3.87–5.11)
RDW: 13.5 % (ref 11.5–15.5)
WBC Count: 7 10*3/uL (ref 4.0–10.5)
nRBC: 0 % (ref 0.0–0.2)

## 2021-04-28 LAB — CMP (CANCER CENTER ONLY)
ALT: 20 U/L (ref 0–44)
AST: 17 U/L (ref 15–41)
Albumin: 3.8 g/dL (ref 3.5–5.0)
Alkaline Phosphatase: 89 U/L (ref 38–126)
Anion gap: 8 (ref 5–15)
BUN: 29 mg/dL — ABNORMAL HIGH (ref 8–23)
CO2: 26 mmol/L (ref 22–32)
Calcium: 9.3 mg/dL (ref 8.9–10.3)
Chloride: 102 mmol/L (ref 98–111)
Creatinine: 0.9 mg/dL (ref 0.44–1.00)
GFR, Estimated: >60 mL/min (ref >60)
Glucose, Bld: 108 mg/dL — ABNORMAL HIGH (ref 70–99)
Potassium: 3.5 mmol/L (ref 3.5–5.1)
Sodium: 136 mmol/L (ref 135–145)
Total Bilirubin: 0.5 mg/dL (ref 0.3–1.2)
Total Protein: 6.9 g/dL (ref 6.5–8.1)

## 2021-04-28 NOTE — Progress Notes (Signed)
Patient Care Team: [name], MD as PCP - General (Family Medicine) [name], RN as Oncology Nurse Navigator Mauro Kaufmann, RN as Oncology Nurse Navigator  DIAGNOSIS:  Encounter Diagnosis  Name Primary?   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 breast in female, estrogen receptor positive ([place])     SUMMARY OF ONCOLOGIC HISTORY: Oncology History  Breast cancer of upper-inner quadrant of right female breast ([place])  12/05/2015 Mammogram   Right breast mass 2:00 4 cm from nipple: 1.7 cm, T1c N0 stage IA; right breast 2:00 6 cm from nipple 1.3 cm mass, 7 mm satellite nodule between the 2 masses, T1 cN0 stage IA   12/06/2015 Initial Diagnosis   Right breast biopsy 2:00 6 cm from nipple: Grade 2-3 invasive ductal cancer, ER 90%, PR 2%, Ki-67 80%, HER-2 positive ratio 2.07; right biopsy 2:00 4 cm from nipple: IDC with DCIS, ER 95%, PR 10%, Ki-67 90%, HER-2 negative ratio 1.27   12/18/2015 Breast MRI   3 enhancing masses in the upper inner quadrant right breast spanning an area 3.7 cm (1.7 cm, 1 cm, 1.4 cm) no lymph node enlargement   12/31/2015 - 04/15/2016 Neo-Adjuvant Chemotherapy   [facility] Perjeta 6 cycles followed by Herceptin, Perjeta maintenance for 1 year completed 10/20/2016   01/08/2016 - 01/12/2016 Hospital Admission   Neutropenic fever hospitalization (because patient did not receive Neulasta with cycle 1)   01/10/2016 Miscellaneous   Genetic testing was normal did not reveal any mutations   04/16/2016 Breast MRI   Near CR to therapy. Previous 2 biopsied massses are no longer seen. Third satellite lesion is smaller from 10 mm to 5.3 mm, no abnormal LN.   05/19/2016 Surgery   Right mastectomy: IDC grade 3, 1.3 cm, low-grade DCIS, LVIDS present, margins negative, 0/1 lymph node negative, ypT1CypN0 stage IA; repeat HER-2 negative ratio 1.41   05/19/2016 Surgery   Right breast reconstruction with tissue expander. Acellular dermis for breast reconstruction  (Dr.Thimappa)    06/22/2016 -  Anti-estrogen oral therapy   Letrozole 2.5 mg daily   11/01/2018 Relapse/Recurrence   Patient palpated lump in the UIQ at surgical scar of reconstructed right breast. US of the right breast showed a 1.8cm mass suspicious for recurrent breast cancer. Biopsy showed grade 2 invasive ductal carcinoma, HER-2 negative (1+), ER 90%, PR negative, Ki67 40%.   11/11/2018 Oncotype testing   Oncotype score 52: Greater than 39% risk of distant recurrence with hormone therapy alone, chemo benefit greater than 15%   11/30/2018 Surgery   Right lumpectomy [name]): IDC, grade 3, 2.1cm, perineural invasion present, carcinoma present focally at the posterior margin and involving skeletal muscle.   11/30/2018 Cancer Staging   Staging form: Breast, AJCC 8th Edition - Pathologic stage from 11/30/2018: Stage IIA (pT2, pN0, cM0, G3, ER+, PR-, HER2-) - Signed by Gardenia Phlegm, NP on 12/14/2018    12/23/2018 - 04/07/2019 Chemotherapy   Taxotere and Cytoxan x6 cycles   05/18/2019 - 06/29/2019 Radiation Therapy   Adjuvant radiation   10/09/2019 Miscellaneous   Abemaciclib 300 mg daily started, decrease to 150 mg daily 10/15/2019 due to abdominal pain     CHIEF COMPLIANT: Recent viral infections and bacterial pneumonia  INTERVAL HISTORY: Patricia Chandler is a 66 year old with above-mentioned history of right breast cancer treated with lumpectomy and adjuvant chemotherapy and is currently on oral antiestrogen therapy along with Verzenio.  She has been tolerating Verzenio fairly well without any major problems or concerns.  Recently last  month she was diagnosed with upper respiratory infection and was put on clarithromycin.  On clarithromycin she developed profound loss of taste and appetite and loss of energy.  She stopped clarithromycin and started to feel slightly better.  During this time she started taking only 1 tablet of Verzenio instead of 2.   ALLERGIES:  has No  Known Allergies.  MEDICATIONS:  Current Outpatient Medications  Medication Sig Dispense Refill   abemaciclib (VERZENIO) 100 MG tablet Take 1 tablet (100 mg total) by mouth 2 (two) times daily. Swallow tablets whole. Do not chew, crush, or split tablets before swallowing. 60 tablet 11   anastrozole (ARIMIDEX) 1 MG tablet TAKE 1 TABLET BY MOUTH DAILY 90 tablet 0   Biotin 1 MG CAPS Take by mouth.     calcium-vitamin D (OSCAL WITH D) 500-200 MG-UNIT tablet Take 1 tablet by mouth daily with breakfast.     clarithromycin (BIAXIN) 500 MG tablet Take 500 mg by mouth 2 (two) times daily.     denosumab (PROLIA) 60 MG/ML SOLN injection Inject 60 mg into the skin every 6 (six) months. Administer in upper arm, thigh, or abdomen     fluticasone (FLONASE) 50 MCG/ACT nasal spray Place into both nostrils.     Lactobacillus (PROBIOTIC ACIDOPHILUS PO) Take 1 capsule by mouth daily.      omeprazole (PRILOSEC) 20 MG capsule Take 20 mg by mouth daily as needed (heartburn).     Turmeric POWD Take by mouth.     zolpidem (AMBIEN) 5 MG tablet TAKE 1 TABLET BY MOUTH EACH NIGHT AT BEDTIME 30 tablet 3   No current facility-administered medications for this visit.    PHYSICAL EXAMINATION: ECOG PERFORMANCE STATUS: 1 - Symptomatic but completely ambulatory  Vitals:   04/28/21 1427  BP: 119/74  Pulse: 97  Resp: 18  Temp: (!) 97.3 F (36.3 C)  SpO2: 100%   Filed Weights   04/28/21 1427  Weight: 147 lb 14.4 oz (67.1 kg)      LABORATORY DATA:  I have reviewed the data as listed CMP Latest Ref Rng & Units 04/28/2021 02/18/2021 12/18/2020  Glucose 70 - 99 mg/dL 108(H) 101(H) 85  BUN 8 - 23 mg/dL 29(H) 26(H) 19  Creatinine 0.44 - 1.00 mg/dL 0.90 0.86 0.89  Sodium 135 - 145 mmol/L 136 137 140  Potassium 3.5 - 5.1 mmol/L 3.5 3.8 4.5  Chloride 98 - 111 mmol/L 102 104 106  CO2 22 - 32 mmol/L '26 27 25  ' Calcium 8.9 - 10.3 mg/dL 9.3 9.1 9.9  Total Protein 6.5 - 8.1 g/dL 6.9 6.6 6.6  Total Bilirubin 0.3 - 1.2 mg/dL  0.5 0.3 0.7  Alkaline Phos 38 - 126 U/L 89 53 51  AST 15 - 41 U/L 17 14(L) 15  ALT 0 - 44 U/L '20 10 12    ' Lab Results  Component Value Date   WBC 7.0 04/28/2021   HGB 11.8 (L) 04/28/2021   HCT 34.9 (L) 04/28/2021   MCV 94.1 04/28/2021   PLT 357 04/28/2021   NEUTROABS 5.8 04/28/2021    ASSESSMENT & PLAN:  Breast cancer of upper-inner quadrant of right female breast ([place]) Right breast biopsy 2:00 6 cm from nipple: Grade 2-3 invasive ductal cancer, ER 90%, PR 2%, Ki-67 80%, HER-2 positive ratio 2.07; right biopsy 2:00 4 cm from nipple: IDC with DCIS, ER 95%, PR 10%, Ki-67 90%, HER-2 negative ratio 1.27 Breast MRI 12/18/2015: 3 enhancing masses in the upper inner quadrant right breast spanning an area  3.7 cm (1.7 cm, 1 cm, 1.4 cm) no lymph node enlargement Clinical stage: T2N0 (stage 2A)   Prior treatment summary: 1. Genetic counseling: Normal did not reveal any abnormal mutations 2. Neoadjuvant chemotherapy. [facility] Perjeta 6 cycles followed by Herceptin And Perjeta maintenance completed 10/20/2016) from 12/31/15 to 04/15/16 3. Right mastectomy with reconstruction: 05/19/2016:Right mastectomy: IDC grade 3, 1.3 cm, low-grade DCIS, LVIDS present, margins negative, 0/1 lymph node negative, ypT1CypN0 stage IA; repeat HER-2 negative ratio 1.41 4. Adjuvant antiestrogen therapy With letrozole 2.5 mg daily started March 2018 -------------------------------------------------------------------------------------------------------------------------------------------------- Relapsed breast cancer: Subcutaneous disease: 1.8 cm measured by ultrasound Biopsy revealed grade 2-3 IDC ER 90% PR 0% HER-2 negative Ki-67 40% 11/30/2018 right lumpectomy [name]): IDC, grade 3, 2.1cm, perineural invasion present, carcinoma present focally at the posterior margin and involving skeletal muscle.   Oncotype score 52: Greater than 39% risk of distant recurrence with hormone therapy alone, chemo benefit greater than 15%    Recommendation: 1.  Surgery to remove the tumor 11/30/2018 2.  Adjuvant chemotherapy with Taxotere and Cytoxan x6 cycles  12/30/2018 -04/07/2019 3.  Adjuvant radiation started 05/19/2019-06/29/2019 4.  Current treatment: Adjuvant antiestrogen therapy with anastrozole started 05/26/2019 with abemaciclib (abemaciclib is for 2 years) ------------------------------------------------------------------------------------------------------------------------------------ Anastrozole toxicities: None Abemaciclib toxicities: 1. Abdominal pain: no further problems in spite of changing the dosage to 100 mg twice a day from 08/19/2020 2. Occasional loose stools 3.  Fatigue 4.  Upper respiratory infection December 2022: Antibiotics caused her profound loss of appetite.   She had breast reconstruction surgery September 15.    Return to clinic in 2 months for labs and follow-up.   Orders Placed This Encounter  Procedures   MR BREAST BILATERAL W WO CONTRAST INC CAD    Standing Status:   Future    Standing Expiration Date:   04/28/2022    Order Specific Question:   If indicated for the ordered procedure, I authorize the administration of contrast media per Radiology protocol    Answer:   Yes    Order Specific Question:   What is the patient's sedation requirement?    Answer:   No Sedation    Order Specific Question:   Does the patient have a pacemaker or implanted devices?    Answer:   No    Order Specific Question:   Preferred imaging location?    Answer:   [facility] (table limit-550lbs)    Order Specific Question:   Release to patient    Answer:   Immediate   CBC with Differential (Cancer Center Only)    Standing Status:   Future    Standing Expiration Date:   04/28/2022   CMP ([facility] only)    Standing Status:   Future    Standing Expiration Date:   04/28/2022   The patient has a good understanding of the overall plan. she agrees with it. she will call with any problems that may develop  before the next visit here. Total time spent: 30 mins including face to face time and time spent for planning, charting and co-ordination of care   Harriette Ohara, MD 04/28/21

## 2021-04-28 NOTE — Telephone Encounter (Signed)
D/w pt.  Feeling better as far as cough, etc.  All the other SE from claritromycin as per message.  Pt took 6 full days of tx.  Advised to stop and no more abx for now.  Seeing onc today-will ask to listen to lungs

## 2021-05-12 ENCOUNTER — Other Ambulatory Visit: Payer: Self-pay | Admitting: Hematology and Oncology

## 2021-05-19 DIAGNOSIS — Z9011 Acquired absence of right breast and nipple: Secondary | ICD-10-CM | POA: Diagnosis not present

## 2021-05-19 DIAGNOSIS — C50911 Malignant neoplasm of unspecified site of right female breast: Secondary | ICD-10-CM | POA: Diagnosis not present

## 2021-05-19 DIAGNOSIS — C50211 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 female breast: Secondary | ICD-10-CM | POA: Diagnosis not present

## 2021-05-19 DIAGNOSIS — Z9889 Other specified postprocedural states: Secondary | ICD-10-CM | POA: Diagnosis not present

## 2021-05-19 DIAGNOSIS — Z923 Personal history of irradiation: Secondary | ICD-10-CM | POA: Diagnosis not present

## 2021-06-02 ENCOUNTER — Other Ambulatory Visit: Payer: Self-pay | Admitting: Hematology and Oncology

## 2021-06-04 ENCOUNTER — Encounter: Payer: Self-pay | Admitting: Hematology and Oncology

## 2021-06-04 ENCOUNTER — Other Ambulatory Visit: Payer: Self-pay

## 2021-06-04 ENCOUNTER — Ambulatory Visit
Admission: RE | Admit: 2021-06-04 | Discharge: 2021-06-04 | Disposition: A | Discharge status: Home / Self Care | Payer: Medicare Other | Source: Ambulatory Visit | Attending: Hematology and Oncology | Admitting: Hematology and Oncology

## 2021-06-04 DIAGNOSIS — C50211 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 female breast: Secondary | ICD-10-CM

## 2021-06-04 DIAGNOSIS — Z853 Personal history of malignant neoplasm of breast: Secondary | ICD-10-CM | POA: Diagnosis not present

## 2021-06-04 DIAGNOSIS — Z9011 Acquired absence of right breast and nipple: Secondary | ICD-10-CM | POA: Diagnosis not present

## 2021-06-04 DIAGNOSIS — Z170 Estrogen receptor positive status [ER+]: Secondary | ICD-10-CM

## 2021-06-04 DIAGNOSIS — K7689 Other specified diseases of liver: Secondary | ICD-10-CM | POA: Diagnosis not present

## 2021-06-04 DIAGNOSIS — Z9882 Breast implant status: Secondary | ICD-10-CM | POA: Diagnosis not present

## 2021-06-04 MED ORDER — GADOBUTROL 1 MMOL/ML IV SOLN
7.0000 mL | Freq: Once | INTRAVENOUS | Status: AC | PRN
  Administered 2021-06-04: 7 mL via INTRAVENOUS

## 2021-06-05 ENCOUNTER — Other Ambulatory Visit: Payer: Self-pay | Admitting: *Deleted

## 2021-06-06 ENCOUNTER — Ambulatory Visit: Payer: Medicare Other | Admitting: Hematology and Oncology

## 2021-06-06 ENCOUNTER — Telehealth: Payer: Self-pay | Admitting: Hematology and Oncology

## 2021-06-06 ENCOUNTER — Other Ambulatory Visit: Payer: Medicare Other

## 2021-06-06 NOTE — Telephone Encounter (Signed)
Called patient to schedule appointment per 2/16 inbasket, patient is aware of date and time.   °

## 2021-06-09 ENCOUNTER — Inpatient Hospital Stay: Payer: Medicare Other | Admitting: Hematology and Oncology

## 2021-06-09 ENCOUNTER — Inpatient Hospital Stay: Payer: Medicare Other

## 2021-06-10 ENCOUNTER — Telehealth: Payer: Self-pay

## 2021-06-10 NOTE — Telephone Encounter (Signed)
Patient Approved for Prolia  $300 dollars  PA Done ,PA Case ID: [id number]  Ready to schedule

## 2021-06-17 ENCOUNTER — Other Ambulatory Visit: Payer: Self-pay

## 2021-06-17 ENCOUNTER — Ambulatory Visit: Payer: Medicare Other

## 2021-06-17 DIAGNOSIS — M810 Age-related osteoporosis without current pathological fracture: Secondary | ICD-10-CM

## 2021-06-17 MED ORDER — DENOSUMAB 60 MG/ML ~~LOC~~ SOSY
60.0000 mg | PREFILLED_SYRINGE | Freq: Once | SUBCUTANEOUS | Status: AC
  Administered 2021-06-17: 60 mg via SUBCUTANEOUS

## 2021-06-17 NOTE — Progress Notes (Signed)
Patricia Chandler 66 yr old female presents for 6 month prolia injection per Garret Reddish, Md. Administered DENOSUMAB 60 mg/mL subcutaneous left arm. Patient tolerated well.

## 2021-06-18 NOTE — Telephone Encounter (Signed)
Pt received her Prolia yesterday 2/28, paid the copay $300. ?

## 2021-06-20 DIAGNOSIS — Z9221 Personal history of antineoplastic chemotherapy: Secondary | ICD-10-CM | POA: Diagnosis not present

## 2021-06-20 DIAGNOSIS — N641 Fat necrosis of breast: Secondary | ICD-10-CM | POA: Diagnosis not present

## 2021-06-20 DIAGNOSIS — K219 Gastro-esophageal reflux disease without esophagitis: Secondary | ICD-10-CM | POA: Diagnosis not present

## 2021-06-20 DIAGNOSIS — Z79899 Other long term (current) drug therapy: Secondary | ICD-10-CM | POA: Diagnosis not present

## 2021-06-20 DIAGNOSIS — G4730 Sleep apnea, unspecified: Secondary | ICD-10-CM | POA: Diagnosis not present

## 2021-06-20 DIAGNOSIS — Z9011 Acquired absence of right breast and nipple: Secondary | ICD-10-CM | POA: Diagnosis not present

## 2021-06-20 DIAGNOSIS — T85898A Other specified complication of other internal prosthetic devices, implants and grafts, initial encounter: Secondary | ICD-10-CM | POA: Diagnosis not present

## 2021-06-20 DIAGNOSIS — Z853 Personal history of malignant neoplasm of breast: Secondary | ICD-10-CM | POA: Diagnosis not present

## 2021-06-20 DIAGNOSIS — Z923 Personal history of irradiation: Secondary | ICD-10-CM | POA: Diagnosis not present

## 2021-06-20 DIAGNOSIS — N651 Disproportion of reconstructed breast: Secondary | ICD-10-CM | POA: Diagnosis not present

## 2021-06-20 DIAGNOSIS — N6489 Other specified disorders of breast: Secondary | ICD-10-CM | POA: Diagnosis not present

## 2021-06-20 DIAGNOSIS — Z86718 Personal history of other venous thrombosis and embolism: Secondary | ICD-10-CM | POA: Diagnosis not present

## 2021-06-27 ENCOUNTER — Encounter: Payer: Self-pay | Admitting: Hematology and Oncology

## 2021-07-01 NOTE — Progress Notes (Signed)
? ?Patient Care Team: ?[name], MD as PCP - General (Family Medicine) ?[name], RN as Oncology Nurse Navigator ?Mauro Kaufmann, RN as Oncology Nurse Navigator ? ?DIAGNOSIS:  ?Encounter Diagnosis  ?Name Primary?  ? 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 breast in female, estrogen receptor positive ([place])   ? ? ?SUMMARY OF ONCOLOGIC HISTORY: ?Oncology History  ?Breast cancer of upper-inner quadrant of right female breast ([place])  ?12/05/2015 Mammogram  ? Right breast mass 2:00 4 cm from nipple: 1.7 cm, T1c N0 stage IA; right breast 2:00 6 cm from nipple 1.3 cm mass, 7 mm satellite nodule between the 2 masses, T1 cN0 stage IA ?  ?12/06/2015 Initial Diagnosis  ? Right breast biopsy 2:00 6 cm from nipple: Grade 2-3 invasive ductal cancer, ER 90%, PR 2%, Ki-67 80%, HER-2 positive ratio 2.07; right biopsy 2:00 4 cm from nipple: IDC with DCIS, ER 95%, PR 10%, Ki-67 90%, HER-2 negative ratio 1.27 ?  ?12/18/2015 Breast MRI  ? 3 enhancing masses in the upper inner quadrant right breast spanning an area 3.7 cm (1.7 cm, 1 cm, 1.4 cm) no lymph node enlargement ?  ?12/31/2015 - 04/15/2016 Neo-Adjuvant Chemotherapy  ? [place] Perjeta ?6 cycles followed by Herceptin, Perjeta maintenance for 1 year completed 10/20/2016 ?  ?01/08/2016 - 01/12/2016 Hospital Admission  ? Neutropenic fever hospitalization (because patient did not receive Neulasta with cycle 1) ?  ?01/10/2016 Miscellaneous  ? Genetic testing was normal did not reveal any mutations ?  ?04/16/2016 Breast MRI  ? Near CR to therapy. Previous 2 biopsied massses are no longer seen. Third satellite lesion is smaller from 10 mm to 5.3 mm, no abnormal LN. ?  ?05/19/2016 Surgery  ? Right mastectomy: IDC grade 3, 1.3 cm, low-grade DCIS, LVIDS present, margins negative, 0/1 lymph node negative, ypT1CypN0 stage IA; repeat HER-2 negative ratio 1.41 ?  ?05/19/2016 Surgery  ? Right breast reconstruction with tissue expander. Acellular dermis for breast reconstruction  (Dr.Thimappa) ? ?  ?06/22/2016 -  Anti-estrogen oral therapy  ? Letrozole 2.5 mg daily ?  ?11/01/2018 Relapse/Recurrence  ? Patient palpated lump in the UIQ at surgical scar of reconstructed right breast. US of the right breast showed a 1.8cm mass suspicious for recurrent breast cancer. Biopsy showed grade 2 invasive ductal carcinoma, HER-2 negative (1+), ER 90%, PR negative, Ki67 40%. ?  ?11/11/2018 Oncotype testing  ? Oncotype score 52: Greater than 39% risk of distant recurrence with hormone therapy alone, chemo benefit greater than 15% ?  ?11/30/2018 Surgery  ? Right lumpectomy [name]): IDC, grade 3, 2.1cm, perineural invasion present, carcinoma present focally at the posterior margin and involving skeletal muscle. ?  ?11/30/2018 Cancer Staging  ? Staging form: Breast, AJCC 8th Edition ?- Pathologic stage from 11/30/2018: Stage IIA (pT2, pN0, cM0, G3, ER+, PR-, HER2-) - Signed by Gardenia Phlegm, NP on 12/14/2018 ? ?  ?12/23/2018 - 04/07/2019 Chemotherapy  ? Taxotere and Cytoxan x6 cycles ?  ?05/18/2019 - 06/29/2019 Radiation Therapy  ? Adjuvant radiation ?  ?10/09/2019 Miscellaneous  ? Abemaciclib 300 mg daily started, decrease to 150 mg daily 10/15/2019 due to abdominal pain ?  ? ? ?CHIEF COMPLIANT:  right breast cancer treated with lumpectomy and adjuvant chemotherapy and is currently on oral antiestrogen therapy along with Verzenio.   ? ?INTERVAL HISTORY: Patricia Chandler is a 66 year old with above-mentioned history of right breast cancer treated with lumpectomy and adjuvant chemotherapy and is currently on oral antiestrogen therapy along with Verzenio.  She completed breast reconstruction surgeries and is recovering from it.  She underwent a mastopexy as well.  She is tolerating Verzenio extremely well. ? ? ?ALLERGIES:  has No Known Allergies. ? ?MEDICATIONS:  ?Current Outpatient Medications  ?Medication Sig Dispense Refill  ? abemaciclib (VERZENIO) 100 MG tablet Take 1 tablet (100 mg total) by  mouth 2 (two) times daily. Swallow tablets whole. Do not chew, crush, or split tablets before swallowing. 60 tablet 11  ? anastrozole (ARIMIDEX) 1 MG tablet TAKE 1 TABLET BY MOUTH DAILY 90 tablet 3  ? Biotin 1 MG CAPS Take by mouth.    ? calcium-vitamin D (OSCAL WITH D) 500-200 MG-UNIT tablet Take 1 tablet by mouth daily with breakfast.    ? clarithromycin (BIAXIN) 500 MG tablet Take 500 mg by mouth 2 (two) times daily.    ? denosumab (PROLIA) 60 MG/ML SOLN injection Inject 60 mg into the skin every 6 (six) months. Administer in upper arm, thigh, or abdomen    ? fluticasone (FLONASE) 50 MCG/ACT nasal spray Place into both nostrils.    ? Lactobacillus (PROBIOTIC ACIDOPHILUS PO) Take 1 capsule by mouth daily.     ? omeprazole (PRILOSEC) 20 MG capsule Take 20 mg by mouth daily as needed (heartburn).    ? Turmeric POWD Take by mouth.    ? zolpidem (AMBIEN) 5 MG tablet TAKE 1 TABLET BY MOUTH EACH NIGHT AT BEDTIME 30 tablet 3  ? ?No current facility-administered medications for this visit.  ? ? ?PHYSICAL EXAMINATION: ?ECOG PERFORMANCE STATUS: 1 - Symptomatic but completely ambulatory ? ?Vitals:  ? 07/03/21 1427  ?BP: 131/89  ?Pulse: (!) 101  ?Resp: 18  ?Temp: (!) 97.3 ?F (36.3 ?C)  ?SpO2: 100%  ? ?Filed Weights  ? 07/03/21 1427  ?Weight: 146 lb 11.2 oz (66.5 kg)  ? ? ?LABORATORY DATA:  ?I have reviewed the data as listed ?CMP Latest Ref Rng & Units 04/28/2021 02/18/2021 12/18/2020  ?Glucose 70 - 99 mg/dL 108(H) 101(H) 85  ?BUN 8 - 23 mg/dL 29(H) 26(H) 19  ?Creatinine 0.44 - 1.00 mg/dL 0.90 0.86 0.89  ?Sodium 135 - 145 mmol/L 136 137 140  ?Potassium 3.5 - 5.1 mmol/L 3.5 3.8 4.5  ?Chloride 98 - 111 mmol/L 102 104 106  ?CO2 22 - 32 mmol/L _0 ?Calcium 8.9 - 10.3 mg/dL 9.3 9.1 9.9  ?Total Protein 6.5 - 8.1 g/dL 6.9 6.6 6.6  ?Total Bilirubin 0.3 - 1.2 mg/dL 0.5 0.3 0.7  ?Alkaline Phos 38 - 126 U/L 89 53 51  ?AST 15 - 41 U/L 17 14(L) 15  ?ALT 0 - 44 U/L _1 ? ? ?Lab Results  ?Component Value Date  ? WBC 8.9 07/03/2021   ? HGB 12.8 07/03/2021  ? HCT 37.5 07/03/2021  ? MCV 94.0 07/03/2021  ? PLT 347 07/03/2021  ? NEUTROABS 6.8 07/03/2021  ? ? ?ASSESSMENT & PLAN:  ?Breast cancer of upper-inner quadrant of right female breast ([place]) ?Right breast biopsy 2:00 6 cm from nipple: Grade 2-3 invasive ductal cancer, ER 90%, PR 2%, Ki-67 80%, HER-2 positive ratio 2.07; right biopsy 2:00 4 cm from nipple: IDC with DCIS, ER 95%, PR 10%, Ki-67 90%, HER-2 negative ratio 1.27 ?Breast MRI 12/18/2015: 3 enhancing masses in the upper inner quadrant right breast spanning an area 3.7 cm (1.7 cm, 1 cm, 1.4 cm) no lymph node enlargement ?Clinical stage: T2N0 (stage 2A) ?  ?Prior treatment summary: ?1. Genetic counseling: Normal did not reveal any abnormal mutations ?  2. Neoadjuvant chemotherapy. ([place] Perjeta ?6 cycles followed by Herceptin And Perjeta maintenance completed 10/20/2016) from 12/31/15 to 04/15/16 ?3. Right mastectomy with reconstruction: 05/19/2016:Right mastectomy: IDC grade 3, 1.3 cm, low-grade DCIS, LVIDS present, margins negative, 0/1 lymph node negative, ypT1CypN0 stage IA; repeat HER-2 negative ratio 1.41 ?4. Adjuvant antiestrogen therapy With letrozole 2.5 mg daily started March 2018 ?-------------------------------------------------------------------------------------------------------------------------------------------------- ?Relapsed breast cancer: Subcutaneous disease: 1.8 cm measured by ultrasound ?Biopsy revealed grade 2-3 IDC ER 90% PR 0% HER-2 negative Ki-67 40% ?11/30/2018 right lumpectomy [name]): IDC, grade 3, 2.1cm, perineural invasion present, carcinoma present focally at the posterior margin and involving skeletal muscle.   ?Oncotype score 52: Greater than 39% risk of distant recurrence with hormone therapy alone, chemo benefit greater than 15% ?  ?Recommendation: ?1.  Surgery to remove the tumor 11/30/2018 ?2.  Adjuvant chemotherapy with Taxotere and Cytoxan x6 cycles  12/30/2018 -04/07/2019 ?3.  Adjuvant radiation  started 05/19/2019-06/29/2019 ?4.  Current treatment: Adjuvant antiestrogen therapy with anastrozole started 05/26/2019 with abemaciclib (abemaciclib is for 2 years) ?-------------------------------------------------

## 2021-07-03 ENCOUNTER — Inpatient Hospital Stay: Payer: Medicare Other | Attending: Hematology and Oncology

## 2021-07-03 ENCOUNTER — Other Ambulatory Visit: Payer: Self-pay

## 2021-07-03 ENCOUNTER — Inpatient Hospital Stay (HOSPITAL_BASED_OUTPATIENT_CLINIC_OR_DEPARTMENT_OTHER): Payer: Medicare Other | Admitting: Hematology and Oncology

## 2021-07-03 DIAGNOSIS — Z170 Estrogen receptor positive status [ER+]: Secondary | ICD-10-CM | POA: Insufficient documentation

## 2021-07-03 DIAGNOSIS — C50211 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 female breast: Secondary | ICD-10-CM | POA: Diagnosis not present

## 2021-07-03 DIAGNOSIS — Z79811 Long term (current) use of aromatase inhibitors: Secondary | ICD-10-CM | POA: Insufficient documentation

## 2021-07-03 DIAGNOSIS — Z9221 Personal history of antineoplastic chemotherapy: Secondary | ICD-10-CM | POA: Insufficient documentation

## 2021-07-03 LAB — CBC WITH DIFFERENTIAL (CANCER CENTER ONLY)
Abs Immature Granulocytes: 0.06 10*3/uL (ref 0.00–0.07)
Basophils Absolute: 0.1 10*3/uL (ref 0.0–0.1)
Basophils Relative: 1 %
Eosinophils Absolute: 0.2 10*3/uL (ref 0.0–0.5)
Eosinophils Relative: 3 %
HCT: 37.5 % (ref 36.0–46.0)
Hemoglobin: 12.8 g/dL (ref 12.0–15.0)
Immature Granulocytes: 1 %
Lymphocytes Relative: 10 %
Lymphs Abs: 0.9 10*3/uL (ref 0.7–4.0)
MCH: 32.1 pg (ref 26.0–34.0)
MCHC: 34.1 g/dL (ref 30.0–36.0)
MCV: 94 fL (ref 80.0–100.0)
Monocytes Absolute: 0.8 10*3/uL (ref 0.1–1.0)
Monocytes Relative: 9 %
Neutro Abs: 6.8 10*3/uL (ref 1.7–7.7)
Neutrophils Relative %: 76 %
Platelet Count: 347 10*3/uL (ref 150–400)
RBC: 3.99 MIL/uL (ref 3.87–5.11)
RDW: 12.8 % (ref 11.5–15.5)
WBC Count: 8.9 10*3/uL (ref 4.0–10.5)
nRBC: 0 % (ref 0.0–0.2)

## 2021-07-03 LAB — CMP (CANCER CENTER ONLY)
ALT: 21 U/L (ref 0–44)
AST: 24 U/L (ref 15–41)
Albumin: 4.4 g/dL (ref 3.5–5.0)
Alkaline Phosphatase: 64 U/L (ref 38–126)
Anion gap: 7 (ref 5–15)
BUN: 26 mg/dL — ABNORMAL HIGH (ref 8–23)
CO2: 28 mmol/L (ref 22–32)
Calcium: 9.9 mg/dL (ref 8.9–10.3)
Chloride: 103 mmol/L (ref 98–111)
Creatinine: 0.89 mg/dL (ref 0.44–1.00)
GFR, Estimated: >60 mL/min (ref >60)
Glucose, Bld: 97 mg/dL (ref 70–99)
Potassium: 3.7 mmol/L (ref 3.5–5.1)
Sodium: 138 mmol/L (ref 135–145)
Total Bilirubin: 0.3 mg/dL (ref 0.3–1.2)
Total Protein: 7 g/dL (ref 6.5–8.1)

## 2021-07-03 NOTE — Assessment & Plan Note (Signed)
Right breast biopsy 2:00 6 cm from nipple: Grade 2-3 invasive ductal cancer, ER 90%, PR 2%, Ki-67 80%, HER-2 positive ratio 2.07; right biopsy 2:00 4 cm from nipple: IDC with DCIS, ER 95%, PR 10%, Ki-67 90%, HER-2 negative ratio 1.27 ?Breast MRI?12/18/2015: 3 enhancing masses in the upper inner quadrant right breast spanning an area 3.7 cm (1.7 cm, 1 cm, 1.4 cm) no lymph node enlargement ?Clinical stage: T2N0 (stage 2A) ?? ?Prior treatment summary: ?1. Genetic counseling: Normal did not reveal any abnormal mutations ?2. Neoadjuvant chemotherapy. ([place] Perjeta ?6 cycles followed by Herceptin And Perjeta?maintenance completed 10/20/2016) from 12/31/15 to 04/15/16 ?3. Right mastectomy with reconstruction: 05/19/2016:Right mastectomy: IDC grade 3, 1.3 cm, low-grade DCIS, LVIDS present, margins negative, 0/1 lymph node negative, ypT1CypN0 stage IA; repeat HER-2 negative ratio 1.41 ?4. Adjuvant antiestrogen therapy With letrozole 2.5 mg daily started March 2018 ?-------------------------------------------------------------------------------------------------------------------------------------------------- ?Relapsed breast cancer: Subcutaneous disease: 1.8 cm measured by ultrasound ?Biopsy revealed grade 2-3 IDC ER 90% PR 0% HER-2 negative Ki-67 40% ?11/30/2018 right lumpectomy Patricia Chandler): IDC, grade 3, 2.1cm, perineural invasion present, carcinoma present focally at the posterior margin and involving skeletal muscle.?? ?Oncotype score 52: Greater than 39% risk of distant recurrence with hormone therapy alone, chemo benefit greater than 15% ?? ?Recommendation: ?1.??Surgery to remove the tumor?11/30/2018 ?2.??Adjuvant chemotherapy with?Taxotere and Cytoxan x6 cycles??12/30/2018 -04/07/2019 ?3.??Adjuvant radiation?started 05/19/2019-06/29/2019 ?4.??Current treatment: Adjuvant antiestrogen therapy?with anastrozole started 05/26/2019 with abemaciclib (abemaciclib is for 2  years) ?------------------------------------------------------------------------------------------------------------------------------------ ?Anastrozole toxicities:?None ?Abemaciclib toxicities: ?1.?Abdominal pain:?no further problems in spite of changing the dosage?to 100 mg twice a day from 08/19/2020 ?2.?Occasional loose stools ?3.??Fatigue ?4.  Upper respiratory infection December 2022: Antibiotics caused her profound loss of appetite. ?? ?She had breast reconstruction surgery September 15. ?? ?Return to clinic in?2 months for labs and follow-up. ?

## 2021-07-08 DIAGNOSIS — Z9889 Other specified postprocedural states: Secondary | ICD-10-CM | POA: Diagnosis not present

## 2021-07-08 DIAGNOSIS — C50211 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 female breast: Secondary | ICD-10-CM | POA: Diagnosis not present

## 2021-07-08 DIAGNOSIS — Z9011 Acquired absence of right breast and nipple: Secondary | ICD-10-CM | POA: Diagnosis not present

## 2021-08-01 NOTE — Telephone Encounter (Signed)
Last Prolia inj 06/17/21 ?Next Prolia inj due 12/16/21 ?

## 2021-08-05 ENCOUNTER — Ambulatory Visit (INDEPENDENT_AMBULATORY_CARE_PROVIDER_SITE_OTHER): Payer: Medicare Other

## 2021-08-05 DIAGNOSIS — Z0000 Encounter for general adult medical examination without abnormal findings: Secondary | ICD-10-CM | POA: Diagnosis not present

## 2021-08-05 NOTE — Progress Notes (Signed)
Virtual Visit via Telephone Note ? ?I connected with  Patricia Chandler on 08/05/21 at 10:00 AM EDT by telephone and verified that I am speaking with the correct person using two identifiers. ? ?Medicare Annual Wellness visit completed telephonically due to Covid-19 pandemic.  ? ?Persons participating in this call: This Health Coach and this patient.  ? ?Location: ?Patient: Home ?Provider: Office ?  ?I discussed the limitations, risks, security and privacy concerns of performing an evaluation and management service by telephone and the availability of in person appointments. The patient expressed understanding and agreed to proceed. ? ?Unable to perform video visit due to video visit attempted and failed and/or patient does not have video capability.  ? ?Some vital signs may be absent or patient reported.  ? ?Willette Brace, LPN ? ? ?Subjective:  ? Patricia Chandler is a 66 y.o. female who presents for an Initial Medicare Annual Wellness Visit. ? ?Review of Systems    ? ?Cardiac Risk Factors include: advanced age (>78mn, >>54women) ? ?   ?Objective:  ?  ?There were no vitals filed for this visit. ?There is no height or weight on file to calculate BMI. ? ? ?  08/05/2021  ? 10:04 AM 05/03/2019  ?  9:59 AM 04/12/2019  ?  1:12 PM 02/09/2019  ?  3:05 PM 01/26/2019  ? 12:22 PM 01/25/2019  ?  3:30 PM 11/18/2018  ?  9:08 AM  ?Advanced Directives  ?Does Patient Have a Medical Advance Directive? Yes Yes Yes No No No No  ?Type of [place]ive [place]      ?Does patient want to make changes to medical advance directive?   No - Patient declined      ?Copy of [place] Chart?  Yes - validated most recent copy scanned in chart (See row information) No - copy requested      ?Would patient like information on creating a medical advance directive?    No - Patient declined No - Patient declined No - Patient  declined   ? ? ?Current Medications (verified) ?Outpatient Encounter Medications as of 08/05/2021  ?Medication Sig  ? abemaciclib (VERZENIO) 100 MG tablet Take 1 tablet (100 mg total) by mouth 2 (two) times daily. Swallow tablets whole. Do not chew, crush, or split tablets before swallowing.  ? anastrozole (ARIMIDEX) 1 MG tablet TAKE 1 TABLET BY MOUTH DAILY  ? Biotin 1 MG CAPS Take by mouth.  ? calcium-vitamin D (OSCAL WITH D) 500-200 MG-UNIT tablet Take 1 tablet by mouth daily with breakfast.  ? denosumab (PROLIA) 60 MG/ML SOLN injection Inject 60 mg into the skin every 6 (six) months. Administer in upper arm, thigh, or abdomen  ? Lactobacillus (PROBIOTIC ACIDOPHILUS PO) Take 1 capsule by mouth daily.   ? omeprazole (PRILOSEC) 20 MG capsule Take 20 mg by mouth daily as needed (heartburn).  ? Turmeric POWD Take by mouth.  ? zolpidem (AMBIEN) 5 MG tablet TAKE 1 TABLET BY MOUTH EACH NIGHT AT BEDTIME  ? FLUAD QUADRIVALENT 0.5 ML injection   ? PFIZER COVID-19 VAC BIVALENT injection   ? [DISCONTINUED] clarithromycin (BIAXIN) 500 MG tablet Take 500 mg by mouth 2 (two) times daily.  ? [DISCONTINUED] fluticasone (FLONASE) 50 MCG/ACT nasal spray Place into both nostrils.  ? ?No facility-administered encounter medications on file as of 08/05/2021.  ? ? ?Allergies (verified) ?Patient has no known allergies.  ? ?History: ?Past Medical  History:  ?Diagnosis Date  ? Anemia   ? due to chemo  ? Anxiety   ? Breast cancer ([place]) dx'd 2017  ? right  ? Cardiomyopathy ([place])   ? mild, per cardiology note; due to Herceptin  ? Dental crowns present   ? Family history of adverse reaction to anesthesia   ? pt's daughter has hx. of post-op nausea  ? GERD (gastroesophageal reflux disease)   ? Heart murmur   ? History of breast cancer 2017  ? right  ? History of chemotherapy   ? finished chemo 04/15/2016  ? Personal history of chemotherapy   ? Personal history of radiation therapy   ? Right Breast Cancer  ? Rash 08/04/2016  ? under right arm -  states recent tick bite; to see MD 08/05/2016  ? Recurrent breast cancer, right ([place]) dx'd 10/2018  ? Runny nose 06/06/2016  ? clear drainage, per pt.  ? ?Past Surgical History:  ?Procedure Laterality Date  ? AUGMENTATION MAMMAPLASTY Left   ? BREAST BIOPSY Right 11/30/2018  ? Procedure: RIGHT BREAST CHEST WALL RECURRENCE EXCISION;  Surgeon: Rolm Bookbinder, MD;  Location: [facility];  Service: General;  Laterality: Right;  ? BREAST ENHANCEMENT SURGERY Bilateral 2005  ? breast mass removal Right 11/30/2018  ? BREAST RECONSTRUCTION WITH PLACEMENT OF TISSUE EXPANDER AND FLEX HD (ACELLULAR HYDRATED DERMIS) Right 05/19/2016  ? Procedure: RIGHT BREAST RECONSTRUCTION WITH PLACEMENT OF TISSUE EXPANDER AND  ALLODERM.;  Surgeon: Irene Limbo, MD;  Location: [facility];  Service: Plastics;  Laterality: Right;  ? COLONOSCOPY    ? COLONOSCOPY WITH PROPOFOL  10/02/2014  ? DEBRIDEMENT AND CLOSURE WOUND Right 06/08/2016  ? Procedure: DEBRIDEMENT RIGHT MASTECTOMY FLAP, TISSUE EXPANSION OF RIGHT CHEST;  Surgeon: Irene Limbo, MD;  Location: [facility];  Service: Plastics;  Laterality: Right;  ? MASTECTOMY Right   ? MASTOPEXY Left 08/11/2016  ? Procedure: LEFT BREAST MASTOPEXY;  Surgeon: Irene Limbo, MD;  Location: [facility];  Service: Plastics;  Laterality: Left;  ? NIPPLE SPARING MASTECTOMY/SENTINAL LYMPH NODE BIOPSY/RECONSTRUCTION/PLACEMENT OF TISSUE EXPANDER Right 05/19/2016  ? Procedure: RIGHT NIPPLE SPARING MASTECTOMY WITH SENTINAL LYMPH NODE BIOPSY WITH BLUE DYE INJECTION;  Surgeon: Rolm Bookbinder, MD;  Location: [facility];  Service: General;  Laterality: Right;  ? PORT-A-CATH REMOVAL Right 04/26/2019  ? Procedure: REMOVAL PORT-A-CATH;  Surgeon: Rolm Bookbinder, MD;  Location: [place];  Service: General;  Laterality: Right;  ? PORTACATH PLACEMENT N/A 12/30/2015  ? Procedure: INSERTION PORT-A-CATH WITH [place];  Surgeon: Rolm Bookbinder, MD;  Location: [facility];  Service: General;  Laterality: N/A;  ? PORTACATH PLACEMENT N/A 11/30/2018  ? Procedure: INSERTION PORT-A-CATH WITH ULTRASOUND;  Surgeon: Rolm Bookbinder, MD;  Location: [facility];  Service: General;  Laterality: N/A;  ? REDUCTION MAMMAPLASTY    ? REMOVAL OF TISSUE EXPANDER AND PLACEMENT OF IMPLANT Right 08/11/2016  ? Procedure: REMOVAL OF RIGHT  TISSUE EXPANDER AND PLACEMENT OF IMPLANT;  Surgeon: Irene Limbo, MD;  Location: [facility];  Service: Plastics;  Laterality: Right;  ? ?Family History  ?Problem Relation Age of Onset  ? Breast cancer Maternal Aunt   ?     dx in her 76s  ? Stroke Father   ?     late 63s  ? Hypertension Father   ? Pulmonary fibrosis Mother   ? Breast cancer Paternal Aunt 63  ? Cancer Paternal Grandmother   ?     abdominal; cervical vs uterine  dx in her 11s  ? Stomach cancer Maternal Grandmother   ? Parkinson's disease Maternal Grandfather   ? Anesthesia problems Daughter   ?     post-op nausea  ? Colon cancer Neg Hx   ? Esophageal cancer Neg Hx   ? Rectal cancer Neg Hx   ? ?Social History  ? ?Socioeconomic History  ? Marital status: Married  ?  Spouse name: Not on file  ? Number of children: 2  ? Years of education: Not on file  ? Highest education level: Not on file  ?Occupational History  ? Occupation: retired  ?Tobacco Use  ? Smoking status: Never  ? Smokeless tobacco: Never  ?Vaping Use  ? Vaping Use: Never used  ?Substance and Sexual Activity  ? Alcohol use: Yes  ?  Comment: occasionally  ? Drug use: No  ? Sexual activity: Not on file  ?Other Topics Concern  ? Not on file  ?Social History Narrative  ? Married 1978 (Al). 2 girls 33 and 36 in 2018. [name] lives in [place], [place] with 107 yo son (single right now). and [name] in [place], [place]--> now [place] with husband and 63 year old daughter (born with heart defect with missing valve and initially not closed VSD--still weighs heavily on family- likely will have to be repaired).   ?   ? Al and  her bought [employer] [place]- prior to breast cancer. Had been doing event design on hold.   ? Graduated from [employer]. [employer] prior.   ?   ? Hobbies: gardening, decorating, sewing, enjoys cr

## 2021-08-05 NOTE — Patient Instructions (Signed)
Ms. Deihl , ?Thank you for taking time to come for your Medicare Wellness Visit. I appreciate your ongoing commitment to your health goals. Please review the following plan we discussed and let me know if I can assist you in the future.  ? ?Screening recommendations/referrals: ?Colonoscopy: Done 09/09/18 repeat  every 7 years  ?Mammogram: Done 12/11/20 repeat every year  ?Bone Density: Done 09/12/19 repeat every 2 years  ?Recommended yearly ophthalmology/optometry visit for glaucoma screening and checkup ?Recommended yearly dental visit for hygiene and checkup ? ?Vaccinations: ?Influenza vaccine: Done 02/26/21 repeat every year  ?Pneumococcal vaccine: Up to date ?Tdap vaccine: Done 06/18/12 repeat every 10 years  ?Shingles vaccine: Completed 2/20, 08/12/17   ?Covid-19:Completed 1/22, 2/12, 12/20/19 & 7/27, & 02/26/21 ? ?Advanced directives: Please bring a copy of your health care power of attorney and living will to the office at your convenience. ? ?Conditions/risks identified: increase exercise and continue to eat healthy ? ?Next appointment: Follow up in one year for your annual wellness visit  ? ? ?Preventive Care 68 Years and Older, Female ?Preventive care refers to lifestyle choices and visits with your health care provider that can promote health and wellness. ?What does preventive care include? ?A yearly physical exam. This is also called an annual well check. ?Dental exams once or twice a year. ?Routine eye exams. Ask your health care provider how often you should have your eyes checked. ?Personal lifestyle choices, including: ?Daily care of your teeth and gums. ?Regular physical activity. ?Eating a healthy diet. ?Avoiding tobacco and drug use. ?Limiting alcohol use. ?Practicing safe sex. ?Taking low-dose aspirin every day. ?Taking vitamin and mineral supplements as recommended by your health care provider. ?What happens during an annual well check? ?The services and screenings done by your health care provider  during your annual well check will depend on your age, overall health, lifestyle risk factors, and family history of disease. ?Counseling  ?Your health care provider may ask you questions about your: ?Alcohol use. ?Tobacco use. ?Drug use. ?Emotional well-being. ?Home and relationship well-being. ?Sexual activity. ?Eating habits. ?History of falls. ?Memory and ability to understand (cognition). ?Work and work Statistician. ?Reproductive health. ?Screening  ?You may have the following tests or measurements: ?Height, weight, and BMI. ?Blood pressure. ?Lipid and cholesterol levels. These may be checked every 5 years, or more frequently if you are over 40 years old. ?Skin check. ?Lung cancer screening. You may have this screening every year starting at age 27 if you have a 30-pack-year history of smoking and currently smoke or have quit within the past 15 years. ?Fecal occult blood test (FOBT) of the stool. You may have this test every year starting at age 36. ?Flexible sigmoidoscopy or colonoscopy. You may have a sigmoidoscopy every 5 years or a colonoscopy every 10 years starting at age 68. ?Hepatitis C blood test. ?Hepatitis B blood test. ?Sexually transmitted disease (STD) testing. ?Diabetes screening. This is done by checking your blood sugar (glucose) after you have not eaten for a while (fasting). You may have this done every 1-3 years. ?Bone density scan. This is done to screen for osteoporosis. You may have this done starting at age 35. ?Mammogram. This may be done every 1-2 years. Talk to your health care provider about how often you should have regular mammograms. ?Talk with your health care provider about your test results, treatment options, and if necessary, the need for more tests. ?Vaccines  ?Your health care provider may recommend certain vaccines, such as: ?Influenza vaccine. This  is recommended every year. ?Tetanus, diphtheria, and acellular pertussis (Tdap, Td) vaccine. You may need a Td booster every  10 years. ?Zoster vaccine. You may need this after age 68. ?Pneumococcal 13-valent conjugate (PCV13) vaccine. One dose is recommended after age 68. ?Pneumococcal polysaccharide (PPSV23) vaccine. One dose is recommended after age 21. ?Talk to your health care provider about which screenings and vaccines you need and how often you need them. ?This information is not intended to replace advice given to you by your health care provider. Make sure you discuss any questions you have with your health care provider. ?Document Released: 05/03/2015 Document Revised: 12/25/2015 Document Reviewed: 02/05/2015 ?Elsevier Interactive Patient Education ? 2017 [place]. ? ?Fall Prevention in the Home ?Falls can cause injuries. They can happen to people of all ages. There are many things you can do to make your home safe and to help prevent falls. ?What can I do on the outside of my home? ?Regularly fix the edges of walkways and driveways and fix any cracks. ?Remove anything that might make you trip as you walk through a door, such as a raised step or threshold. ?Trim any bushes or trees on the path to your home. ?Use bright outdoor lighting. ?Clear any walking paths of anything that might make someone trip, such as rocks or tools. ?Regularly check to see if handrails are loose or broken. Make sure that both sides of any steps have handrails. ?Any raised decks and porches should have guardrails on the edges. ?Have any leaves, snow, or ice cleared regularly. ?Use sand or salt on walking paths during winter. ?Clean up any spills in your garage right away. This includes oil or grease spills. ?What can I do in the bathroom? ?Use night lights. ?Install grab bars by the toilet and in the tub and shower. Do not use towel bars as grab bars. ?Use non-skid mats or decals in the tub or shower. ?If you need to sit down in the shower, use a plastic, non-slip stool. ?Keep the floor dry. Clean up any water that spills on the floor as soon as it  happens. ?Remove soap buildup in the tub or shower regularly. ?Attach bath mats securely with double-sided non-slip rug tape. ?Do not have throw rugs and other things on the floor that can make you trip. ?What can I do in the bedroom? ?Use night lights. ?Make sure that you have a light by your bed that is easy to reach. ?Do not use any sheets or blankets that are too big for your bed. They should not hang down onto the floor. ?Have a firm chair that has side arms. You can use this for support while you get dressed. ?Do not have throw rugs and other things on the floor that can make you trip. ?What can I do in the kitchen? ?Clean up any spills right away. ?Avoid walking on wet floors. ?Keep items that you use a lot in easy-to-reach places. ?If you need to reach something above you, use a strong step stool that has a grab bar. ?Keep electrical cords out of the way. ?Do not use floor polish or wax that makes floors slippery. If you must use wax, use non-skid floor wax. ?Do not have throw rugs and other things on the floor that can make you trip. ?What can I do with my stairs? ?Do not leave any items on the stairs. ?Make sure that there are handrails on both sides of the stairs and use them. Fix handrails that  are broken or loose. Make sure that handrails are as long as the stairways. ?Check any carpeting to make sure that it is firmly attached to the stairs. Fix any carpet that is loose or worn. ?Avoid having throw rugs at the top or bottom of the stairs. If you do have throw rugs, attach them to the floor with carpet tape. ?Make sure that you have a light switch at the top of the stairs and the bottom of the stairs. If you do not have them, ask someone to add them for you. ?What else can I do to help prevent falls? ?Wear shoes that: ?Do not have high heels. ?Have rubber bottoms. ?Are comfortable and fit you well. ?Are closed at the toe. Do not wear sandals. ?If you use a stepladder: ?Make sure that it is fully opened.  Do not climb a closed stepladder. ?Make sure that both sides of the stepladder are locked into place. ?Ask someone to hold it for you, if possible. ?Clearly mark and make sure that you can see: ?Any gr

## 2021-08-18 NOTE — Progress Notes (Signed)
? ?Patient Care Team: ?[name], MD as PCP - General (Family Medicine) ?[name], RN as Oncology Nurse Navigator ?Mauro Kaufmann, RN as Oncology Nurse Navigator ? ?DIAGNOSIS:  ?Encounter Diagnoses  ?Name Primary?  ? 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 breast in female, estrogen receptor positive ([place])   ? Post-menopausal Yes  ? ? ?SUMMARY OF ONCOLOGIC HISTORY: ?Oncology History  ?Breast cancer of upper-inner quadrant of right female breast ([place])  ?12/05/2015 Mammogram  ? Right breast mass 2:00 4 cm from nipple: 1.7 cm, T1c N0 stage IA; right breast 2:00 6 cm from nipple 1.3 cm mass, 7 mm satellite nodule between the 2 masses, T1 cN0 stage IA ? ?  ?12/06/2015 Initial Diagnosis  ? Right breast biopsy 2:00 6 cm from nipple: Grade 2-3 invasive ductal cancer, ER 90%, PR 2%, Ki-67 80%, HER-2 positive ratio 2.07; right biopsy 2:00 4 cm from nipple: IDC with DCIS, ER 95%, PR 10%, Ki-67 90%, HER-2 negative ratio 1.27 ? ?  ?12/18/2015 Breast MRI  ? 3 enhancing masses in the upper inner quadrant right breast spanning an area 3.7 cm (1.7 cm, 1 cm, 1.4 cm) no lymph node enlargement ? ?  ?12/31/2015 - 04/15/2016 Neo-Adjuvant Chemotherapy  ? [place] Perjeta ?6 cycles followed by Herceptin, Perjeta maintenance for 1 year completed 10/20/2016 ? ?  ?01/08/2016 - 01/12/2016 Hospital Admission  ? Neutropenic fever hospitalization (because patient did not receive Neulasta with cycle 1) ? ?  ?01/10/2016 Miscellaneous  ? Genetic testing was normal did not reveal any mutations ? ?  ?04/16/2016 Breast MRI  ? Near CR to therapy. Previous 2 biopsied massses are no longer seen. Third satellite lesion is smaller from 10 mm to 5.3 mm, no abnormal LN. ? ?  ?05/19/2016 Surgery  ? Right mastectomy: IDC grade 3, 1.3 cm, low-grade DCIS, LVIDS present, margins negative, 0/1 lymph node negative, ypT1CypN0 stage IA; repeat HER-2 negative ratio 1.41 ? ?  ?05/19/2016 Surgery  ? Right breast reconstruction with tissue expander.  Acellular dermis for breast reconstruction (Dr.Thimappa) ? ? ?  ?06/22/2016 -  Anti-estrogen oral therapy  ? Letrozole 2.5 mg daily ? ?  ?11/01/2018 Relapse/Recurrence  ? Patient palpated lump in the UIQ at surgical scar of reconstructed right breast. US of the right breast showed a 1.8cm mass suspicious for recurrent breast cancer. Biopsy showed grade 2 invasive ductal carcinoma, HER-2 negative (1+), ER 90%, PR negative, Ki67 40%. ?  ?11/11/2018 Oncotype testing  ? Oncotype score 52: Greater than 39% risk of distant recurrence with hormone therapy alone, chemo benefit greater than 15% ?  ?11/30/2018 Surgery  ? Right lumpectomy [name]): IDC, grade 3, 2.1cm, perineural invasion present, carcinoma present focally at the posterior margin and involving skeletal muscle. ?  ?11/30/2018 Cancer Staging  ? Staging form: Breast, AJCC 8th Edition ?- Pathologic stage from 11/30/2018: Stage IIA (pT2, pN0, cM0, G3, ER+, PR-, HER2-) - Signed by Gardenia Phlegm, NP on 12/14/2018 ? ?  ?12/23/2018 - 04/07/2019 Chemotherapy  ? Taxotere and Cytoxan x6 cycles ?  ?05/18/2019 - 06/29/2019 Radiation Therapy  ? Adjuvant radiation ?  ?10/09/2019 Miscellaneous  ? Abemaciclib 300 mg daily started, decrease to 150 mg daily 10/15/2019 due to abdominal pain ?  ? ? ?CHIEF COMPLIANT: antiestrogen therapy along with Verzenio.   ?  ? ?INTERVAL HISTORY: Patricia Chandler is a 66 year old with above-mentioned history of right breast cancer treated with lumpectomy and adjuvant chemotherapy and is currently on oral antiestrogen therapy along with Verzenio.  She presents to the clinic today for a follow-up. She states that the diarrhea comes and goes. She state that thins are great. ? ? ?ALLERGIES:  has No Known Allergies. ? ?MEDICATIONS:  ?Current Outpatient Medications  ?Medication Sig Dispense Refill  ? abemaciclib (VERZENIO) 100 MG tablet Take 1 tablet (100 mg total) by mouth 2 (two) times daily. Swallow tablets whole. Do not chew, crush, or  split tablets before swallowing. 60 tablet 11  ? anastrozole (ARIMIDEX) 1 MG tablet TAKE 1 TABLET BY MOUTH DAILY 90 tablet 3  ? Biotin 1 MG CAPS Take by mouth.    ? calcium-vitamin D (OSCAL WITH D) 500-200 MG-UNIT tablet Take 1 tablet by mouth daily with breakfast.    ? denosumab (PROLIA) 60 MG/ML SOLN injection Inject 60 mg into the skin every 6 (six) months. Administer in upper arm, thigh, or abdomen    ? FLUAD QUADRIVALENT 0.5 ML injection     ? Lactobacillus (PROBIOTIC ACIDOPHILUS PO) Take 1 capsule by mouth daily.     ? omeprazole (PRILOSEC) 20 MG capsule Take 20 mg by mouth daily as needed (heartburn).    ? PFIZER COVID-19 VAC BIVALENT injection     ? Turmeric POWD Take by mouth.    ? zolpidem (AMBIEN) 5 MG tablet TAKE 1 TABLET BY MOUTH EACH NIGHT AT BEDTIME 30 tablet 3  ? ?No current facility-administered medications for this visit.  ? ? ?PHYSICAL EXAMINATION: ?ECOG PERFORMANCE STATUS: 1 - Symptomatic but completely ambulatory ? ?Vitals:  ? 09/01/21 1450  ?BP: 113/84  ?Pulse: 82  ?Resp: 17  ?Temp: (!) 97.2 ?F (36.2 ?C)  ?SpO2: 100%  ? ?Filed Weights  ? 09/01/21 1450  ?Weight: 148 lb 12.8 oz (67.5 kg)  ? ?  ? ?LABORATORY DATA:  ?I have reviewed the data as listed ? ?  Latest Ref Rng & Units 09/01/2021  ?  2:20 PM 07/03/2021  ?  2:15 PM 04/28/2021  ?  2:12 PM  ?CMP  ?Glucose 70 - 99 mg/dL 73   97   108    ?BUN 8 - 23 mg/dL _0 ?Creatinine 0.44 - 1.00 mg/dL 1.03   0.89   0.90    ?Sodium 135 - 145 mmol/L 138   138   136    ?Potassium 3.5 - 5.1 mmol/L 4.0   3.7   3.5    ?Chloride 98 - 111 mmol/L 106   103   102    ?CO2 22 - 32 mmol/L _1 ?Calcium 8.9 - 10.3 mg/dL 9.3   9.9   9.3    ?Total Protein 6.5 - 8.1 g/dL 6.8   7.0   6.9    ?Total Bilirubin 0.3 - 1.2 mg/dL 0.4   0.3   0.5    ?Alkaline Phos 38 - 126 U/L 51   64   89    ?AST 15 - 41 U/L _2 ?ALT 0 - 44 U/L _3 ? ? ?Lab Results  ?Component Value Date  ? WBC 4.5 09/01/2021  ? HGB 13.0 09/01/2021  ? HCT 38.0 09/01/2021   ? MCV 93.8 09/01/2021  ? PLT 247 09/01/2021  ? NEUTROABS 2.9 09/01/2021  ? ? ?ASSESSMENT & PLAN:  ?Breast cancer of upper-inner quadrant of right female breast ([place]) ?Right breast biopsy 2:00 6 cm  from nipple: Grade 2-3 invasive ductal cancer, ER 90%, PR 2%, Ki-67 80%, HER-2 positive ratio 2.07; right biopsy 2:00 4 cm from nipple: IDC with DCIS, ER 95%, PR 10%, Ki-67 90%, HER-2 negative ratio 1.27 ?Breast MRI 12/18/2015: 3 enhancing masses in the upper inner quadrant right breast spanning an area 3.7 cm (1.7 cm, 1 cm, 1.4 cm) no lymph node enlargement ?Clinical stage: T2N0 (stage 2A) ?  ?Prior treatment summary: ?1. Genetic counseling: Normal did not reveal any abnormal mutations ?2. Neoadjuvant chemotherapy. ([place] Perjeta ?6 cycles followed by Herceptin And Perjeta maintenance completed 10/20/2016) from 12/31/15 to 04/15/16 ?3. Right mastectomy with reconstruction: 05/19/2016:Right mastectomy: IDC grade 3, 1.3 cm, low-grade DCIS, LVIDS present, margins negative, 0/1 lymph node negative, ypT1CypN0 stage IA; repeat HER-2 negative ratio 1.41 ?4. Adjuvant antiestrogen therapy With letrozole 2.5 mg daily started March 2018 ?-------------------------------------------------------------------------------------------------------------------------------------------------- ?Relapsed breast cancer: Subcutaneous disease: 1.8 cm measured by ultrasound ?Biopsy revealed grade 2-3 IDC ER 90% PR 0% HER-2 negative Ki-67 40% ?11/30/2018 right lumpectomy [name]): IDC, grade 3, 2.1cm, perineural invasion present, carcinoma present focally at the posterior margin and involving skeletal muscle.   ?Oncotype score 52: Greater than 39% risk of distant recurrence with hormone therapy alone, chemo benefit greater than 15% ?  ?Recommendation: ?1.  Surgery to remove the tumor 11/30/2018 ?2.  Adjuvant chemotherapy with Taxotere and Cytoxan x6 cycles  12/30/2018 -04/07/2019 ?3.  Adjuvant radiation started 05/19/2019-06/29/2019 ?4.  Current treatment:  Adjuvant antiestrogen therapy with anastrozole started 05/26/2019 with abemaciclib (abemaciclib is for 2 years) started June 2021.  To be completed June 2023 ?5.  D IEP flap reconstruction January 02, 2021 ?--

## 2021-08-29 ENCOUNTER — Other Ambulatory Visit: Payer: Self-pay

## 2021-08-29 DIAGNOSIS — Z170 Estrogen receptor positive status [ER+]: Secondary | ICD-10-CM

## 2021-09-01 ENCOUNTER — Other Ambulatory Visit: Payer: Self-pay

## 2021-09-01 ENCOUNTER — Inpatient Hospital Stay: Payer: Medicare Other | Attending: Hematology and Oncology

## 2021-09-01 ENCOUNTER — Encounter: Payer: Self-pay | Admitting: Hematology and Oncology

## 2021-09-01 ENCOUNTER — Inpatient Hospital Stay (HOSPITAL_BASED_OUTPATIENT_CLINIC_OR_DEPARTMENT_OTHER): Payer: Medicare Other | Admitting: Hematology and Oncology

## 2021-09-01 VITALS — BP 113/84 | HR 82 | Temp 97.2°F | Resp 17 | Ht 63.0 in | Wt 148.8 lb

## 2021-09-01 DIAGNOSIS — Z923 Personal history of irradiation: Secondary | ICD-10-CM | POA: Diagnosis not present

## 2021-09-01 DIAGNOSIS — Z780 Asymptomatic menopausal state: Secondary | ICD-10-CM | POA: Diagnosis not present

## 2021-09-01 DIAGNOSIS — Z170 Estrogen receptor positive status [ER+]: Secondary | ICD-10-CM

## 2021-09-01 DIAGNOSIS — M810 Age-related osteoporosis without current pathological fracture: Secondary | ICD-10-CM | POA: Insufficient documentation

## 2021-09-01 DIAGNOSIS — C50211 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 female breast: Secondary | ICD-10-CM | POA: Insufficient documentation

## 2021-09-01 DIAGNOSIS — Z9221 Personal history of antineoplastic chemotherapy: Secondary | ICD-10-CM | POA: Diagnosis not present

## 2021-09-01 LAB — CBC WITH DIFFERENTIAL (CANCER CENTER ONLY)
Abs Immature Granulocytes: 0.01 10*3/uL (ref 0.00–0.07)
Basophils Absolute: 0.1 10*3/uL (ref 0.0–0.1)
Basophils Relative: 2 %
Eosinophils Absolute: 0.5 10*3/uL (ref 0.0–0.5)
Eosinophils Relative: 10 %
HCT: 38 % (ref 36.0–46.0)
Hemoglobin: 13 g/dL (ref 12.0–15.0)
Immature Granulocytes: 0 %
Lymphocytes Relative: 14 %
Lymphs Abs: 0.6 10*3/uL — ABNORMAL LOW (ref 0.7–4.0)
MCH: 32.1 pg (ref 26.0–34.0)
MCHC: 34.2 g/dL (ref 30.0–36.0)
MCV: 93.8 fL (ref 80.0–100.0)
Monocytes Absolute: 0.5 10*3/uL (ref 0.1–1.0)
Monocytes Relative: 11 %
Neutro Abs: 2.9 10*3/uL (ref 1.7–7.7)
Neutrophils Relative %: 63 %
Platelet Count: 247 10*3/uL (ref 150–400)
RBC: 4.05 MIL/uL (ref 3.87–5.11)
RDW: 13.7 % (ref 11.5–15.5)
WBC Count: 4.5 10*3/uL (ref 4.0–10.5)
nRBC: 0 % (ref 0.0–0.2)

## 2021-09-01 LAB — CMP (CANCER CENTER ONLY)
ALT: 12 U/L (ref 0–44)
AST: 15 U/L (ref 15–41)
Albumin: 4.2 g/dL (ref 3.5–5.0)
Alkaline Phosphatase: 51 U/L (ref 38–126)
Anion gap: 5 (ref 5–15)
BUN: 21 mg/dL (ref 8–23)
CO2: 27 mmol/L (ref 22–32)
Calcium: 9.3 mg/dL (ref 8.9–10.3)
Chloride: 106 mmol/L (ref 98–111)
Creatinine: 1.03 mg/dL — ABNORMAL HIGH (ref 0.44–1.00)
GFR, Estimated: 59 mL/min — ABNORMAL LOW (ref >60)
Glucose, Bld: 73 mg/dL (ref 70–99)
Potassium: 4 mmol/L (ref 3.5–5.1)
Sodium: 138 mmol/L (ref 135–145)
Total Bilirubin: 0.4 mg/dL (ref 0.3–1.2)
Total Protein: 6.8 g/dL (ref 6.5–8.1)

## 2021-09-01 NOTE — Assessment & Plan Note (Addendum)
Right breast biopsy 2:00 6 cm from nipple: Grade 2-3 invasive ductal cancer, ER 90%, PR 2%, Ki-67 80%, HER-2 positive ratio 2.07; right biopsy 2:00 4 cm from nipple: IDC with DCIS, ER 95%, PR 10%, Ki-67 90%, HER-2 negative ratio 1.27 ?Breast MRI?12/18/2015: 3 enhancing masses in the upper inner quadrant right breast spanning an area 3.7 cm (1.7 cm, 1 cm, 1.4 cm) no lymph node enlargement ?Clinical stage: T2N0 (stage 2A) ?? ?Prior treatment summary: ?1. Genetic counseling: Normal did not reveal any abnormal mutations ?2. Neoadjuvant chemotherapy. (Brandon Perjeta ?6 cycles followed by Herceptin And Perjeta?maintenance completed 10/20/2016) from 12/31/15 to 04/15/16 ?3. Right mastectomy with reconstruction: 05/19/2016:Right mastectomy: IDC grade 3, 1.3 cm, low-grade DCIS, LVIDS present, margins negative, 0/1 lymph node negative, [id number] stage IA; repeat HER-2 negative ratio 1.41 ?4. Adjuvant antiestrogen therapy With letrozole 2.5 mg daily started March 2018 ?-------------------------------------------------------------------------------------------------------------------------------------------------- ?Relapsed breast cancer: Subcutaneous disease: 1.8 cm measured by ultrasound ?Biopsy revealed grade 2-3 IDC ER 90% PR 0% HER-2 negative Ki-67 40% ?11/30/2018 right lumpectomy Patricia Chandler): IDC, grade 3, 2.1cm, perineural invasion present, carcinoma present focally at the posterior margin and involving skeletal muscle.?? ?Oncotype score 52: Greater than 39% risk of distant recurrence with hormone therapy alone, chemo benefit greater than 15% ?? ?Recommendation: ?1.??Surgery to remove the tumor?11/30/2018 ?2.??Adjuvant chemotherapy with?Taxotere and Cytoxan x6 cycles??12/30/2018 -04/07/2019 ?3.??Adjuvant radiation?started 05/19/2019-06/29/2019 ?4.??Current treatment: Adjuvant antiestrogen therapy?with anastrozole started 05/26/2019 with abemaciclib (abemaciclib is for 2 years) started June 2021.  To be completed June 2023 ?5.  D IEP  flap reconstruction January 02, 2021 ?------------------------------------------------------------------------------------------------------------------------------------ ?Anastrozole toxicities:?None ?Abemaciclib toxicities: ?1.?Abdominal pain:?no further problems in spite of changing the dosage?to 100 mg twice a day from 08/19/2020 ?2.?Occasional loose stools ?3.??Fatigue ?? ?She will complete abemaciclib in June. ?She will continue with annual mammograms and breast MRIs. ?Bone density will be ordered to be done in the next month or so. ? ?For osteoporosis she is currently off Prolia for this time but then she will resume Prolia when she comes back in 6 months. ?She will follow with Mendel Ryder for her long-term survivorship. ?

## 2021-09-02 ENCOUNTER — Other Ambulatory Visit: Payer: Self-pay | Admitting: *Deleted

## 2021-09-02 DIAGNOSIS — Z170 Estrogen receptor positive status [ER+]: Secondary | ICD-10-CM

## 2021-09-16 ENCOUNTER — Ambulatory Visit: Payer: Medicare Other | Admitting: Plastic Surgery

## 2021-09-16 ENCOUNTER — Encounter: Payer: Self-pay | Admitting: Plastic Surgery

## 2021-09-16 VITALS — BP 121/81 | HR 94 | Ht 63.0 in | Wt 148.6 lb

## 2021-09-16 DIAGNOSIS — Z9011 Acquired absence of right breast and nipple: Secondary | ICD-10-CM | POA: Diagnosis not present

## 2021-09-16 DIAGNOSIS — Z9010 Acquired absence of unspecified breast and nipple: Secondary | ICD-10-CM | POA: Insufficient documentation

## 2021-09-16 DIAGNOSIS — C50211 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 female breast: Secondary | ICD-10-CM

## 2021-09-16 DIAGNOSIS — Z170 Estrogen receptor positive status [ER+]: Secondary | ICD-10-CM | POA: Diagnosis not present

## 2021-09-16 NOTE — Progress Notes (Signed)
Patient ID: Patricia Chandler, female    DOB: [birth year], 66 y.o.   MRN: [id number]   Chief Complaint  Patient presents with   Advice Only    The patient is a 66 year old female here for evaluation of her breasts.  She had breast cancer about 7 years ago and underwent right-sided mastectomy.  She had a Diep flap reconstruction in February at [facility].  She then had a left breast mastopexy in March for symmetry.  She is pleased with her results and is ready for improved symmetry with tattooing.  She is aware that she would likely benefit from bilateral tattooing because she has very little color on the left breast.  She also has some skin telangiectasias that would do well with some laser.   Review of Systems  Constitutional: Negative.   Eyes: Negative.   Respiratory: Negative.    Cardiovascular: Negative.   Gastrointestinal: Negative.   Endocrine: Negative.   Genitourinary: Negative.   Musculoskeletal: Negative.   Skin: Negative.   Neurological:  Negative for facial asymmetry.   Past Medical History:  Diagnosis Date   Anemia    due to chemo   Anxiety    Breast cancer ([place]) dx'd 2017   right   Cardiomyopathy ([place])    mild, per cardiology note; due to Herceptin   Dental crowns present    Family history of adverse reaction to anesthesia    pt's daughter has hx. of post-op nausea   GERD (gastroesophageal reflux disease)    Heart murmur    History of breast cancer 2017   right   History of chemotherapy    finished chemo 04/15/2016   Personal history of chemotherapy    Personal history of radiation therapy    Right Breast Cancer   Rash 08/04/2016   under right arm - states recent tick bite; to see MD 08/05/2016   Recurrent breast cancer, right ([place]) dx'd 10/2018   Runny nose 06/06/2016   clear drainage, per pt.    Past Surgical History:  Procedure Laterality Date   AUGMENTATION MAMMAPLASTY Left    BREAST BIOPSY Right 11/30/2018   Procedure: RIGHT BREAST CHEST  WALL RECURRENCE EXCISION;  Surgeon: Rolm Bookbinder, MD;  Location: [place];  Service: General;  Laterality: Right;   BREAST ENHANCEMENT SURGERY Bilateral 2005   breast mass removal Right 11/30/2018   BREAST RECONSTRUCTION WITH PLACEMENT OF TISSUE EXPANDER AND FLEX HD (ACELLULAR HYDRATED DERMIS) Right 05/19/2016   Procedure: RIGHT BREAST RECONSTRUCTION WITH PLACEMENT OF TISSUE EXPANDER AND  ALLODERM.;  Surgeon: Irene Limbo, MD;  Location: [place];  Service: Plastics;  Laterality: Right;   COLONOSCOPY     COLONOSCOPY WITH PROPOFOL  10/02/2014   DEBRIDEMENT AND CLOSURE WOUND Right 06/08/2016   Procedure: DEBRIDEMENT RIGHT MASTECTOMY FLAP, TISSUE EXPANSION OF RIGHT CHEST;  Surgeon: Irene Limbo, MD;  Location: [place];  Service: Plastics;  Laterality: Right;   MASTECTOMY Right    MASTOPEXY Left 08/11/2016   Procedure: LEFT BREAST MASTOPEXY;  Surgeon: Irene Limbo, MD;  Location: [place];  Service: Plastics;  Laterality: Left;   NIPPLE SPARING MASTECTOMY/SENTINAL LYMPH NODE BIOPSY/RECONSTRUCTION/PLACEMENT OF TISSUE EXPANDER Right 05/19/2016   Procedure: RIGHT NIPPLE SPARING MASTECTOMY WITH SENTINAL LYMPH NODE BIOPSY WITH BLUE DYE INJECTION;  Surgeon: Rolm Bookbinder, MD;  Location: [place];  Service: General;  Laterality: Right;   PORT-A-CATH REMOVAL Right 04/26/2019   Procedure: REMOVAL PORT-A-CATH;  Surgeon: Rolm Bookbinder, MD;  Location: [facility];  Service: General;  Laterality: Right;   PORTACATH PLACEMENT N/A 12/30/2015   Procedure: INSERTION PORT-A-CATH WITH [place];  Surgeon: Rolm Bookbinder, MD;  Location: [place];  Service: General;  Laterality: N/A;   PORTACATH PLACEMENT N/A 11/30/2018   Procedure: INSERTION PORT-A-CATH WITH ULTRASOUND;  Surgeon: Rolm Bookbinder, MD;  Location: [place];  Service: General;  Laterality: N/A;   REDUCTION MAMMAPLASTY     REMOVAL OF TISSUE EXPANDER AND PLACEMENT OF  IMPLANT Right 08/11/2016   Procedure: REMOVAL OF RIGHT  TISSUE EXPANDER AND PLACEMENT OF IMPLANT;  Surgeon: Irene Limbo, MD;  Location: [place];  Service: Plastics;  Laterality: Right;      Current Outpatient Medications:    abemaciclib (VERZENIO) 100 MG tablet, Take 1 tablet (100 mg total) by mouth 2 (two) times daily. Swallow tablets whole. Do not chew, crush, or split tablets before swallowing., Disp: 60 tablet, Rfl: 11   anastrozole (ARIMIDEX) 1 MG tablet, TAKE 1 TABLET BY MOUTH DAILY, Disp: 90 tablet, Rfl: 3   Biotin 1 MG CAPS, Take by mouth., Disp: , Rfl:    calcium-vitamin D (OSCAL WITH D) 500-200 MG-UNIT tablet, Take 1 tablet by mouth daily with breakfast., Disp: , Rfl:    denosumab (PROLIA) 60 MG/ML SOLN injection, Inject 60 mg into the skin every 6 (six) months. Administer in upper arm, thigh, or abdomen, Disp: , Rfl:    FLUAD QUADRIVALENT 0.5 ML injection, , Disp: , Rfl:    Lactobacillus (PROBIOTIC ACIDOPHILUS PO), Take 1 capsule by mouth daily. , Disp: , Rfl:    omeprazole (PRILOSEC) 20 MG capsule, Take 20 mg by mouth daily as needed (heartburn)., Disp: , Rfl:    PFIZER COVID-19 VAC BIVALENT injection, , Disp: , Rfl:    Turmeric POWD, Take by mouth., Disp: , Rfl:    zolpidem (AMBIEN) 5 MG tablet, TAKE 1 TABLET BY MOUTH EACH NIGHT AT BEDTIME, Disp: 30 tablet, Rfl: 3   Objective:   Vitals:   09/16/21 1408  BP: 121/81  Pulse: 94  SpO2: 98%    Physical Exam Vitals reviewed.  Constitutional:      Appearance: Normal appearance.  HENT:     Head: Normocephalic and atraumatic.  Cardiovascular:     Rate and Rhythm: Normal rate.     Pulses: Normal pulses.  Pulmonary:     Effort: Pulmonary effort is normal.  Abdominal:     Palpations: Abdomen is soft.  Skin:    Capillary Refill: Capillary refill takes less than 2 seconds.     Coloration: Skin is not jaundiced.     Findings: No bruising.  Neurological:     Mental Status: She is alert and oriented to  person, place, and time.  Psychiatric:        Mood and Affect: Mood normal.        Behavior: Behavior normal.        Thought Content: Thought content normal.    Assessment & Plan:  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 breast in female, estrogen receptor positive ([place])  Acquired absence of right nipple  I will arrange for the patient to have tubing.  She will likely need tattooing on both sides for symmetry.  She would benefit from laser on her chest area for the telangiectasias.  We could do 3 treatments 1 month apart for $300.  Pictures were obtained of the patient and placed in the chart with the patient's or guardian's permission.   [place], DO

## 2021-09-17 DIAGNOSIS — Z9011 Acquired absence of right breast and nipple: Secondary | ICD-10-CM | POA: Diagnosis not present

## 2021-09-17 DIAGNOSIS — C50211 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 female breast: Secondary | ICD-10-CM | POA: Diagnosis not present

## 2021-09-17 DIAGNOSIS — Z9889 Other specified postprocedural states: Secondary | ICD-10-CM | POA: Diagnosis not present

## 2021-09-23 ENCOUNTER — Encounter: Payer: Self-pay | Admitting: Family Medicine

## 2021-09-23 ENCOUNTER — Ambulatory Visit (INDEPENDENT_AMBULATORY_CARE_PROVIDER_SITE_OTHER): Payer: Medicare Other | Admitting: Family Medicine

## 2021-09-23 VITALS — BP 100/64 | HR 84 | Temp 98.2°F | Ht 63.0 in | Wt 151.6 lb

## 2021-09-23 DIAGNOSIS — Z0000 Encounter for general adult medical examination without abnormal findings: Secondary | ICD-10-CM

## 2021-09-23 NOTE — Patient Instructions (Addendum)
Health Maintenance Due  Topic Date Due   TETANUS/TDAP  07/19/2021  Get at pharmacy and let us know please  You opted for labs next visit  Recommended follow up: Return in about 1 year (around 09/24/2022) for physical or sooner if needed.Schedule b4 you leave.

## 2021-09-23 NOTE — Progress Notes (Signed)
Phone [phone]   Subjective:  Patient presents today for their annual physical. Chief complaint-noted.   See problem oriented charting- Review of Systems  Constitutional:  Negative for chills and fever.  HENT:  Positive for congestion. Negative for sinus pain.        Daily runny nose or congestion with allergies  Eyes:  Negative for blurred vision and double vision.  Respiratory:  Negative for cough and shortness of breath.   Cardiovascular:  Negative for chest pain and palpitations.  Gastrointestinal:  Positive for heartburn. Negative for blood in stool, constipation, diarrhea, melena, nausea and vomiting.  Genitourinary:  Negative for dysuria and frequency.  Musculoskeletal:  Negative for back pain, myalgias and neck pain.  Skin:  Negative for itching and rash.  Neurological:  Negative for dizziness and headaches.  Endo/Heme/Allergies:  Negative for polydipsia. Does not bruise/bleed easily.  Psychiatric/Behavioral:  Negative for depression and suicidal ideas.    The following were reviewed and entered/updated in epic: Past Medical History:  Diagnosis Date   Anemia    due to chemo   Anxiety    Breast cancer ([place]) dx'd 2017   right   Cardiomyopathy ([place])    mild, per cardiology note; due to Herceptin   Dental crowns present    Family history of adverse reaction to anesthesia    pt's daughter has hx. of post-op nausea   GERD (gastroesophageal reflux disease)    Heart murmur    History of breast cancer 2017   right   History of chemotherapy    finished chemo 04/15/2016   Personal history of chemotherapy    Personal history of radiation therapy    Right Breast Cancer   Rash 08/04/2016   under right arm - states recent tick bite; to see MD 08/05/2016   Recurrent breast cancer, right ([place]) dx'd 10/2018   Runny nose 06/06/2016   clear drainage, per pt.   Patient Active Problem List   Diagnosis Date Noted   Port catheter in place 01/08/2016    Priority: High   Breast  cancer of upper-inner quadrant of right female breast ([place]) 12/18/2015    Priority: High   Osteoporosis 05/07/2020    Priority: Medium    Acute diverticulitis 06/14/2018    Priority: Medium    Hypokalemia 03/30/2016    Priority: Medium    Chemotherapy-induced thrombocytopenia 03/30/2016    Priority: Medium    GERD (gastroesophageal reflux disease) 01/08/2016    Priority: Medium    Acute bursitis of right shoulder 06/08/2017    Priority: Low   Acute shoulder bursitis, left 03/08/2017    Priority: Low   DDD (degenerative disc disease), cervical 10/08/2016    Priority: Low   Genetic testing 01/10/2016    Priority: Low   History of sepsis 01/08/2016    Priority: Low   Family history of breast cancer     Priority: Low   Internal hemorrhoid 01/26/2011    Priority: Low   Acquired absence of nipple 09/16/2021   Snoring 01/23/2020   Insomnia 01/08/2020   Liver lesion 06/21/2018   Intra-abdominal abscess ([place]) 06/14/2018   Past Surgical History:  Procedure Laterality Date   AUGMENTATION MAMMAPLASTY Left    BREAST BIOPSY Right 11/30/2018   Procedure: RIGHT BREAST CHEST WALL RECURRENCE EXCISION;  Surgeon: Rolm Bookbinder, MD;  Location: [place];  Service: General;  Laterality: Right;   BREAST ENHANCEMENT SURGERY Bilateral 2005   breast mass removal Right 11/30/2018   BREAST RECONSTRUCTION WITH PLACEMENT OF TISSUE EXPANDER  AND FLEX HD (ACELLULAR HYDRATED DERMIS) Right 05/19/2016   Procedure: RIGHT BREAST RECONSTRUCTION WITH PLACEMENT OF TISSUE EXPANDER AND  ALLODERM.;  Surgeon: Irene Limbo, MD;  Location: [place];  Service: Plastics;  Laterality: Right;   COLONOSCOPY     COLONOSCOPY WITH PROPOFOL  10/02/2014   DEBRIDEMENT AND CLOSURE WOUND Right 06/08/2016   Procedure: DEBRIDEMENT RIGHT MASTECTOMY FLAP, TISSUE EXPANSION OF RIGHT CHEST;  Surgeon: Irene Limbo, MD;  Location: [place];  Service: Plastics;  Laterality: Right;   MASTECTOMY Right    MASTOPEXY Left  08/11/2016   Procedure: LEFT BREAST MASTOPEXY;  Surgeon: Irene Limbo, MD;  Location: [place];  Service: Plastics;  Laterality: Left;   NIPPLE SPARING MASTECTOMY/SENTINAL LYMPH NODE BIOPSY/RECONSTRUCTION/PLACEMENT OF TISSUE EXPANDER Right 05/19/2016   Procedure: RIGHT NIPPLE SPARING MASTECTOMY WITH SENTINAL LYMPH NODE BIOPSY WITH BLUE DYE INJECTION;  Surgeon: Rolm Bookbinder, MD;  Location: [place];  Service: General;  Laterality: Right;   PORT-A-CATH REMOVAL Right 04/26/2019   Procedure: REMOVAL PORT-A-CATH;  Surgeon: Rolm Bookbinder, MD;  Location: [place];  Service: General;  Laterality: Right;   PORTACATH PLACEMENT N/A 12/30/2015   Procedure: INSERTION PORT-A-CATH WITH [place];  Surgeon: Rolm Bookbinder, MD;  Location: [place];  Service: General;  Laterality: N/A;   PORTACATH PLACEMENT N/A 11/30/2018   Procedure: INSERTION PORT-A-CATH WITH ULTRASOUND;  Surgeon: Rolm Bookbinder, MD;  Location: [place];  Service: General;  Laterality: N/A;   REDUCTION MAMMAPLASTY     REMOVAL OF TISSUE EXPANDER AND PLACEMENT OF IMPLANT Right 08/11/2016   Procedure: REMOVAL OF RIGHT  TISSUE EXPANDER AND PLACEMENT OF IMPLANT;  Surgeon: Irene Limbo, MD;  Location: [place];  Service: Plastics;  Laterality: Right;    Family History  Problem Relation Age of Onset   Breast cancer Maternal Aunt        dx in her 27s   Stroke Father        late 23s   Hypertension Father    Pulmonary fibrosis Mother    Breast cancer Paternal Aunt 51   Cancer Paternal Grandmother        abdominal; cervical vs uterine dx in her 40s   Stomach cancer Maternal Grandmother    Parkinson's disease Maternal Grandfather    Anesthesia problems Daughter        post-op nausea   Colon cancer Neg Hx    Esophageal cancer Neg Hx    Rectal cancer Neg Hx     Medications- reviewed and updated Current Outpatient Medications  Medication Sig Dispense  Refill   abemaciclib (VERZENIO) 100 MG tablet Take 1 tablet (100 mg total) by mouth 2 (two) times daily. Swallow tablets whole. Do not chew, crush, or split tablets before swallowing. 60 tablet 11   anastrozole (ARIMIDEX) 1 MG tablet TAKE 1 TABLET BY MOUTH DAILY 90 tablet 3   Biotin 1 MG CAPS Take by mouth.     calcium-vitamin D (OSCAL WITH D) 500-200 MG-UNIT tablet Take 1 tablet by mouth daily with breakfast.     denosumab (PROLIA) 60 MG/ML SOLN injection Inject 60 mg into the skin every 6 (six) months. Administer in upper arm, thigh, or abdomen     Lactobacillus (PROBIOTIC ACIDOPHILUS PO) Take 1 capsule by mouth daily.      omeprazole (PRILOSEC) 20 MG capsule Take 20 mg by mouth daily as needed (heartburn).     Turmeric POWD Take by mouth.     zolpidem (AMBIEN) 5 MG  tablet TAKE 1 TABLET BY MOUTH EACH NIGHT AT BEDTIME 30 tablet 3   No current facility-administered medications for this visit.    Allergies-reviewed and updated No Known Allergies  Social History   Social History Narrative   Married 1978 (Al). 2 girls 33 and 36 in 2018. [name] lives in [place], [place] with 70 yo son (single right now). and [name] in [place], [place]--> now [place] with husband and 26 year old daughter (born with heart defect with missing valve and initially not closed VSD--still weighs heavily on family- likely will have to be repaired).       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    Graduated from [employer]. [employer] prior.       Hobbies: gardening, decorating, sewing, enjoys creating   Objective  Objective:  BP 100/64   Pulse 84   Temp 98.2 F (36.8 C)   Ht '5\' 3"'$  (1.6 m)   Wt 151 lb 9.6 oz (68.8 kg)   SpO2 99%   BMI 26.85 kg/m  Gen: NAD, resting comfortably HEENT: Mucous membranes are moist. Oropharynx normal Neck: no thyromegaly CV: RRR no murmurs rubs or gallops Lungs: CTAB no crackles, wheeze,  rhonchi Abdomen: soft/nontender/nondistended/normal bowel sounds. No rebound or guarding.  Ext: no edema Skin: warm, dry Neuro: grossly normal, moves all extremities, PERRLA   Assessment and Plan   67 y.o. female presenting for annual physical.  Health Maintenance counseling: 1. Anticipatory guidance: Patient counseled regarding regular dental exams -q6 months, eye exams - every few years- no significant vision changes,  avoiding smoking and second hand smoke , limiting alcohol to 1 beverage per day , no illicit drugs .   2. Risk factor reduction:  Advised patient of need for regular exercise and diet rich and fruits and vegetables to reduce risk of heart attack and stroke.  Exercise- elliptical/walking/gardening and bought a jump rope wants to try.  Diet/weight management-Down 4 pounds from last year. Reasonably healthy diet- even grows own veggies Wt Readings from Last 3 Encounters:  09/23/21 151 lb 9.6 oz (68.8 kg)  09/16/21 148 lb 9.6 oz (67.4 kg)  09/01/21 148 lb 12.8 oz (67.5 kg)  3. Immunizations/screenings/ancillary studies- Tdap today. Husband had covid a month ago and she did not test positive!  Immunization History  Administered Date(s) Administered   Fluad Quad(high Dose 65+) 01/22/2020   Influenza, High Dose Seasonal PF 12/10/2018   Influenza,inj,Quad PF,6+ Mos 02/18/2016, 02/01/2017, 02/28/2018   Influenza-Unspecified 01/19/2016, 02/19/2020, 02/26/2021   PFIZER(Purple Top)SARS-COV-2 Vaccination 05/12/2019, 06/02/2019, 12/20/2019, 11/13/2020, 02/26/2021   PNEUMOCOCCAL CONJUGATE-20 12/10/2020   Pfizer Covid-19 Vaccine Bivalent Booster 13yr & up 02/26/2021   Pneumococcal Conjugate-13 11/02/2018   Tdap 07/20/2011   Zoster Recombinat (Shingrix) 06/09/2017, 08/12/2017  4. Cervical cancer screening- still sees GYN but gets q5 years tests cervical cancer screening. Technically past age based screening recommendations 5. Breast cancer screening-follows closely with Dr. GLindi Adie with recurrence history .  Remains on Arimidex and Verzenio (stopping this summer).  Last mammogram  12/11/20- scheduled for this year but had bilateral MRI in between mammogram- alternates every 6 months 6. Colon cancer screening - May 2020 with 7-year repeat due to polyp history 7. Skin cancer screening-sees dermatology at least every few years- just seen in sept for full body check. advised regular sunscreen use. Denies worrisome, changing, or new skin lesions.  8. Birth control/STD check- postmenopausal and monogamous 9. Osteoporosis screening  at 74- prior on Prolia for 4 years.  Oncology recommended potentially a 1 year break between doses for potential remodeling of bone did not suggest Reclast injection (she does not tolerate Fosamax).  Last dose 06/17/2021.  Last bone density on 09/12/2019 and she is scheduled for November with oncology 10. Smoking associated screening - Never smoker  Status of chronic or acute concerns   #Insomnia-had delirium with trazodone in the past.  Does well with Ambien 5 mg fortunately.  We paxlovid to continue due to significant sleep issues if does not take medication-discussed last year possible half dose 2.5 mg- she has tolerated thankfully   #History diverticulitis-no recent recurrences-had been with some consideration of surgery if Recurrent abscess   #Osteoporosis-see above  #History of liver lesion-stable over 3 different imaging test-no further follow-up suggested by radiology. Also unchangd on 06/04/21 mri breast at 12 mm  #hyperlipidemia S: Medication: None Lab Results  Component Value Date   CHOL 217 (H) 12/18/2020   HDL 104 12/18/2020   LDLCALC 103 (H) 12/18/2020   TRIG 51 12/18/2020   CHOLHDL 2.1 12/18/2020   A/P: Mildly elevated LDL but unable to calculate ASCVD risk due to high HDL-offered repeat at this time and she would like to do in the future- getting q2 month bloodwork with cancer meds- wants to get exercise completely rebuilt then  recheck  #GERD-doing well after on 20 mg omeprazole- goes on and off   Recommended follow up: Return in about 1 year (around 09/24/2022) for physical or sooner if needed.Schedule b4 you leave. Future Appointments  Date Time Provider [place]  09/26/2021 10:00 AM Corena Herter, PA-C [facility] None  12/02/2021  1:00 PM Dillingham, Loel Lofty, DO [facility] None  12/30/2021  1:45 PM Dillingham, Loel Lofty, DO [facility] None  02/06/2022  1:15 PM Dillingham, Loel Lofty, DO [facility] None  02/26/2022  3:30 PM [facility] DX DEXA 1 [facility] GI-BREAST CE   Lab/Order associations:NO LABS TODAY   ICD-10-CM   1. Preventative health care  Z00.00       No orders of the defined types were placed in this encounter.   Return precautions advised.  Garret Reddish, MD

## 2021-09-26 ENCOUNTER — Ambulatory Visit (INDEPENDENT_AMBULATORY_CARE_PROVIDER_SITE_OTHER): Payer: Medicare Other | Admitting: Physician Assistant

## 2021-09-26 DIAGNOSIS — Z9011 Acquired absence of right breast and nipple: Secondary | ICD-10-CM | POA: Diagnosis not present

## 2021-09-26 NOTE — Progress Notes (Signed)
Referring Provider [name], MD [place],  [place] [address]   CC:  Chief Complaint  Patient presents with   Breast Problem      Patricia Chandler is an 66 y.o. female.  HPI: Patient is a pleasant 66 year old female with PMH of right-sided breast cancer s/p cystectomy with Diep flap reconstruction and left-sided mastopexy for symmetry.  She was seen initially by Dr. Marla Roe 09/16/2021 and inquired about nipple tattooing and possible laser treatment for skin telangiectasias.  Pictures were obtained at that visit.  Patient is very much in proceeding with nipple tattoo.  She denies any allergies or history of fever blisters.  She is not on any blood thinner medications.  She states that ever since having her left-sided mastopexy's, her areola is slightly hypopigmented and she was hoping that it could be tattooed as well.   No Known Allergies  Outpatient Encounter Medications as of 09/26/2021  Medication Sig Note   abemaciclib (VERZENIO) 100 MG tablet Take 1 tablet (100 mg total) by mouth 2 (two) times daily. Swallow tablets whole. Do not chew, crush, or split tablets before swallowing.    anastrozole (ARIMIDEX) 1 MG tablet TAKE 1 TABLET BY MOUTH DAILY    Biotin 1 MG CAPS Take by mouth.    calcium-vitamin D (OSCAL WITH D) 500-200 MG-UNIT tablet Take 1 tablet by mouth daily with breakfast.    denosumab (PROLIA) 60 MG/ML SOLN injection Inject 60 mg into the skin every 6 (six) months. Administer in upper arm, thigh, or abdomen 08/05/2021: Pt will skip next dose per oncologist   Lactobacillus (PROBIOTIC ACIDOPHILUS PO) Take 1 capsule by mouth daily.     omeprazole (PRILOSEC) 20 MG capsule Take 20 mg by mouth daily as needed (heartburn).    Turmeric POWD Take by mouth.    zolpidem (AMBIEN) 5 MG tablet TAKE 1 TABLET BY MOUTH EACH NIGHT AT BEDTIME    No facility-administered encounter medications on file as of 09/26/2021.     Past Medical History:   Diagnosis Date   Anemia    due to chemo   Anxiety    Breast cancer ([facility]) dx'd 2017   right   Cardiomyopathy ([facility])    mild, per cardiology note; due to Herceptin   Dental crowns present    Family history of adverse reaction to anesthesia    pt's daughter has hx. of post-op nausea   GERD (gastroesophageal reflux disease)    Heart murmur    History of breast cancer 2017   right   History of chemotherapy    finished chemo 04/15/2016   Personal history of chemotherapy    Personal history of radiation therapy    Right Breast Cancer   Rash 08/04/2016   under right arm - states recent tick bite; to see MD 08/05/2016   Recurrent breast cancer, right ([facility]) dx'd 10/2018   Runny nose 06/06/2016   clear drainage, per pt.    Past Surgical History:  Procedure Laterality Date   AUGMENTATION MAMMAPLASTY Left    BREAST BIOPSY Right 11/30/2018   Procedure: RIGHT BREAST CHEST WALL RECURRENCE EXCISION;  Surgeon: Rolm Bookbinder, MD;  Location: [facility];  Service: General;  Laterality: Right;   BREAST ENHANCEMENT SURGERY Bilateral 2005   breast mass removal Right 11/30/2018   BREAST RECONSTRUCTION WITH PLACEMENT OF TISSUE EXPANDER AND FLEX HD (ACELLULAR HYDRATED DERMIS) Right 05/19/2016   Procedure: RIGHT BREAST RECONSTRUCTION WITH PLACEMENT OF TISSUE EXPANDER AND  ALLODERM.;  Surgeon: Irene Limbo,  MD;  Location: [facility];  Service: Plastics;  Laterality: Right;   COLONOSCOPY     COLONOSCOPY WITH PROPOFOL  10/02/2014   DEBRIDEMENT AND CLOSURE WOUND Right 06/08/2016   Procedure: DEBRIDEMENT RIGHT MASTECTOMY FLAP, TISSUE EXPANSION OF RIGHT CHEST;  Surgeon: Irene Limbo, MD;  Location: [facility];  Service: Plastics;  Laterality: Right;   MASTECTOMY Right    MASTOPEXY Left 08/11/2016   Procedure: LEFT BREAST MASTOPEXY;  Surgeon: Irene Limbo, MD;  Location: [facility];  Service: Plastics;  Laterality: Left;   NIPPLE SPARING MASTECTOMY/SENTINAL LYMPH NODE  BIOPSY/RECONSTRUCTION/PLACEMENT OF TISSUE EXPANDER Right 05/19/2016   Procedure: RIGHT NIPPLE SPARING MASTECTOMY WITH SENTINAL LYMPH NODE BIOPSY WITH BLUE DYE INJECTION;  Surgeon: Rolm Bookbinder, MD;  Location: [facility];  Service: General;  Laterality: Right;   PORT-A-CATH REMOVAL Right 04/26/2019   Procedure: REMOVAL PORT-A-CATH;  Surgeon: Rolm Bookbinder, MD;  Location: [facility];  Service: General;  Laterality: Right;   PORTACATH PLACEMENT N/A 12/30/2015   Procedure: INSERTION PORT-A-CATH WITH [place];  Surgeon: Rolm Bookbinder, MD;  Location: [facility];  Service: General;  Laterality: N/A;   PORTACATH PLACEMENT N/A 11/30/2018   Procedure: INSERTION PORT-A-CATH WITH ULTRASOUND;  Surgeon: Rolm Bookbinder, MD;  Location: [facility];  Service: General;  Laterality: N/A;   REDUCTION MAMMAPLASTY     REMOVAL OF TISSUE EXPANDER AND PLACEMENT OF IMPLANT Right 08/11/2016   Procedure: REMOVAL OF RIGHT  TISSUE EXPANDER AND PLACEMENT OF IMPLANT;  Surgeon: Irene Limbo, MD;  Location: [facility];  Service: Plastics;  Laterality: Right;    Family History  Problem Relation Age of Onset   Breast cancer Maternal Aunt        dx in her 48s   Stroke Father        late 50s   Hypertension Father    Pulmonary fibrosis Mother    Breast cancer Paternal Aunt 49   Cancer Paternal Grandmother        abdominal; cervical vs uterine dx in her 35s   Stomach cancer Maternal Grandmother    Parkinson's disease Maternal Grandfather    Anesthesia problems Daughter        post-op nausea   Colon cancer Neg Hx    Esophageal cancer Neg Hx    Rectal cancer Neg Hx     Social History   Social History Narrative   Married 1978 (Al). 2 girls 33 and 36 in 2018. [name] lives in [place], [place] with 61 yo son (single right now). and [name] in [place], [place]--> now [place] with husband and 51 year old daughter (born with heart defect with missing valve  and initially not closed VSD--still weighs heavily on family- likely will have to be repaired).       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    Graduated from [employer]. [employer] prior.       Hobbies: gardening, decorating, sewing, enjoys creating     Review of Systems General: Denies fevers or chills Cardio: Denies chest pain Pulmonary: Denies difficulty breathing  Physical Exam    09/23/2021    1:19 PM 09/16/2021    2:08 PM 09/01/2021    2:50 PM  Vitals with BMI  Height '5\' 3"'$  '5\' 3"'$  '5\' 3"'$   Weight 151 lbs 10 oz 148 lbs 10 oz 148 lbs 13 oz  BMI 26.86 75.64 33.29  Systolic 518 841  579  Diastolic 64 81 84  Pulse 84 94 82    General:  No acute distress, nontoxic appearing  Respiratory: No increased work of breathing Neuro: Alert and oriented Psychiatric: Normal mood and affect   Assessment/Plan  Applied ink testing here today in clinic to ensure there were no skin reactions.  She is leaning towards fair peach, warm honey, and fair honey as pigment colors.  Discussed what to expect in terms of tattooing as well as postprocedural recovery.  Krista Blue 09/26/2021, 10:44 AM

## 2021-09-30 ENCOUNTER — Other Ambulatory Visit: Payer: Self-pay | Admitting: Hematology and Oncology

## 2021-10-10 ENCOUNTER — Other Ambulatory Visit: Payer: Self-pay | Admitting: Hematology and Oncology

## 2021-10-10 DIAGNOSIS — Z1231 Encounter for screening mammogram for malignant neoplasm of breast: Secondary | ICD-10-CM

## 2021-10-18 NOTE — Telephone Encounter (Signed)
Prolia VOB initiated via [email or web address]  Last Prolia inj 06/17/21 Next Prolia inj due 12/16/21

## 2021-10-27 NOTE — Telephone Encounter (Signed)
Prior auth required for PROLIA  PA PROCESS DETAILS: Effective 04/20/2021 if the patient is new to Prolia, Prior authorization and Step Therapy are required & not on file. Please go to [email or web address] or call [phone] to initiate the prior authorization. For exception to the policy please visit [email or web address] and review Policy Number [id number] 

## 2021-11-03 ENCOUNTER — Ambulatory Visit: Payer: Medicare Other | Admitting: Physician Assistant

## 2021-11-17 ENCOUNTER — Ambulatory Visit: Payer: Medicare Other | Admitting: Physician Assistant

## 2021-11-20 ENCOUNTER — Telehealth: Payer: Self-pay

## 2021-11-20 NOTE — Telephone Encounter (Signed)
Patient is scheduled for the the 18th for nipple tattooing and is expected to go out of town afterwards. Patricia Chandler just wanted to know if she is going to be okay to travel afterwards, and if so is she expected to be in any pain/discomfort for any extended period of time.

## 2021-11-20 NOTE — Telephone Encounter (Signed)
Per [place], pt should be fine to travel just avoid swimming and getting area wet. Pt conveyed understanding.

## 2021-11-22 NOTE — Telephone Encounter (Signed)
PA# [id number] Valid: 11/22/21-11/23/22

## 2021-11-22 NOTE — Telephone Encounter (Signed)
Pt ready for scheduling on or after 12/16/21  Out-of-pocket cost due at time of visit: $396  Primary: [place] Medicare Adv Prolia co-insurance: 20% (approximately $276) Admin fee co-insurance: $20  Secondary: n/a Prolia co-insurance:  Admin fee co-insurance:   Deductible: does not apply  Prior Auth: APPROVED (buy and bill) PA# [id number] Valid: 11/22/21-11/23/22  ** This summary of benefits is an estimation of the patient's out-of-pocket cost. Exact cost may vary based on individual plan coverage.

## 2021-11-24 NOTE — Telephone Encounter (Signed)
Patient states her oncologist has asked for her to skip a dose.  States her next one will be due 2/29/23.  States she has discussed this with Dr. Yong Channel and he had agreed.  Please move reminder to end of Feb.

## 2021-11-25 ENCOUNTER — Ambulatory Visit: Payer: Medicare Other | Admitting: Plastic Surgery

## 2021-11-28 ENCOUNTER — Other Ambulatory Visit: Payer: Self-pay | Admitting: *Deleted

## 2021-11-28 ENCOUNTER — Encounter: Payer: Self-pay | Admitting: *Deleted

## 2021-11-28 NOTE — Patient Outreach (Signed)
  Care Coordination   Initial Visit Note   11/28/2021 Name: Patricia Chandler MRN: [id number] DOB: 1956/01/14  Patricia Chandler is a 66 y.o. year old female who sees Patricia Chandler, Patricia Mars, MD for primary care. I spoke with  Patricia Chandler by phone today  What matters to the patients health and wellness today?  No needs    Goals Addressed               This Visit's Progress     No needs (pt-stated)        Care Coordination Interventions: Reviewed medications with patient and discussed Purpose of all medications Reviewed scheduled/upcoming provider appointments including AWV 09/25/2022 verified by pt Screening for signs and symptoms of depression related to chronic disease state  Assessed social determinant of health barriers         SDOH assessments and interventions completed:  Yes  SDOH Interventions Today    Flowsheet Row Most Recent Value  SDOH Interventions   Food Insecurity Interventions Intervention Not Indicated  Housing Interventions Intervention Not Indicated  Transportation Interventions Intervention Not Indicated        Care Coordination Interventions Activated:  Yes  Care Coordination Interventions:  Yes, provided   Follow up plan: No further intervention required.   Encounter Outcome:  Pt. Visit Completed   Patricia Mina, RN Care Management Coordinator [place] Office [phone]

## 2021-11-28 NOTE — Patient Instructions (Signed)
Visit Information  Thank you for taking time to visit with me today. Please don't hesitate to contact me if I can be of assistance to you.   Following are the goals we discussed today:   Goals Addressed               This Visit's Progress     No needs (pt-stated)        Care Coordination Interventions: Reviewed medications with patient and discussed Purpose of all medications Reviewed scheduled/upcoming provider appointments including AWV 09/25/2022 verified by pt Screening for signs and symptoms of depression related to chronic disease state  Assessed social determinant of health barriers         Please call the care guide team at [phone] if you need to cancel or reschedule your appointment.   If you are experiencing a Mental Health or [place] or need someone to talk to, please call the Suicide and Crisis Lifeline: 988  Patient verbalizes understanding of instructions and care plan provided today and agrees to view in [place]. Active MyChart status and patient understanding of how to access instructions and care plan via MyChart confirmed with patient.     No further follow up required: No needs  Raina Mina, RN Care Management Coordinator [facility] Office [phone]

## 2021-12-02 ENCOUNTER — Encounter: Payer: Self-pay | Admitting: Plastic Surgery

## 2021-12-02 ENCOUNTER — Ambulatory Visit (INDEPENDENT_AMBULATORY_CARE_PROVIDER_SITE_OTHER): Payer: Self-pay | Admitting: Plastic Surgery

## 2021-12-02 DIAGNOSIS — C50211 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 female breast: Secondary | ICD-10-CM

## 2021-12-02 DIAGNOSIS — Z719 Counseling, unspecified: Secondary | ICD-10-CM

## 2021-12-02 DIAGNOSIS — Z170 Estrogen receptor positive status [ER+]: Secondary | ICD-10-CM

## 2021-12-02 NOTE — Progress Notes (Signed)
Preoperative Dx: Telangiectasia of the chest after radiation  Postoperative Dx:  same  Procedure: laser to chest  Anesthesia: none  Description of Procedure:  Risks and complications were explained to the patient. Consent was confirmed and signed. Eye protection was placed. Time out was called and all information was confirmed to be correct. The area  area was prepped with alcohol and wiped dry. The BBL laser was set on the body vascular setting at 560 nm 10 J/cm2. The chest was lasered. The patient tolerated the procedure well and there were no complications. The patient is to follow up in 4 weeks.

## 2021-12-05 ENCOUNTER — Ambulatory Visit: Payer: Medicare Other | Admitting: Physician Assistant

## 2021-12-05 ENCOUNTER — Other Ambulatory Visit: Payer: Medicare Other | Admitting: Plastic Surgery

## 2021-12-09 ENCOUNTER — Other Ambulatory Visit: Payer: Medicare Other | Admitting: Plastic Surgery

## 2021-12-10 ENCOUNTER — Ambulatory Visit: Payer: Medicare Other | Admitting: Physician Assistant

## 2021-12-12 ENCOUNTER — Other Ambulatory Visit: Payer: Self-pay | Admitting: Hematology and Oncology

## 2021-12-12 ENCOUNTER — Ambulatory Visit
Admission: RE | Admit: 2021-12-12 | Discharge: 2021-12-12 | Disposition: A | Discharge status: Home / Self Care | Payer: Medicare Other | Source: Ambulatory Visit | Attending: Hematology and Oncology | Admitting: Hematology and Oncology

## 2021-12-12 DIAGNOSIS — Z1231 Encounter for screening mammogram for malignant neoplasm of breast: Secondary | ICD-10-CM

## 2021-12-26 ENCOUNTER — Ambulatory Visit: Payer: Medicare Other | Admitting: Plastic Surgery

## 2021-12-26 ENCOUNTER — Ambulatory Visit: Payer: Medicare Other | Admitting: Physician Assistant

## 2021-12-30 ENCOUNTER — Encounter: Payer: Self-pay | Admitting: Plastic Surgery

## 2021-12-30 ENCOUNTER — Ambulatory Visit (INDEPENDENT_AMBULATORY_CARE_PROVIDER_SITE_OTHER): Payer: Self-pay | Admitting: Plastic Surgery

## 2021-12-30 DIAGNOSIS — Z719 Counseling, unspecified: Secondary | ICD-10-CM

## 2021-12-30 NOTE — Progress Notes (Signed)
Preoperative Dx: vascular on chest from radiation  Postoperative Dx:  same  Procedure: laser to chest   Anesthesia: none  Description of Procedure:  Risks and complications were explained to the patient. Consent was confirmed and signed. Eye protection was placed. Time out was called and all information was confirmed to be correct. The area  area was prepped with alcohol and wiped dry. The BBL laser was set at Vascular 560nm 7 J/cm2. The chest was lasered. The patient tolerated the procedure well and there were no complications. The patient is to follow up in 4 weeks.

## 2022-01-12 ENCOUNTER — Encounter: Payer: Self-pay | Admitting: *Deleted

## 2022-01-12 ENCOUNTER — Ambulatory Visit: Payer: Medicare Other | Admitting: Physician Assistant

## 2022-01-12 DIAGNOSIS — Z853 Personal history of malignant neoplasm of breast: Secondary | ICD-10-CM | POA: Diagnosis not present

## 2022-01-12 DIAGNOSIS — Z9011 Acquired absence of right breast and nipple: Secondary | ICD-10-CM | POA: Diagnosis not present

## 2022-01-12 NOTE — Progress Notes (Signed)
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  JUSTIFICATION FOR PROCEDURE:  Ms. Lanum is a 66 y.o. female with a history of breast cancer status post breast reconstruction. The patient presents for nipple areolar complex tattoo. Risks, benefits, indications, and alternatives of the above described procedures were discussed with the patient and all the patient's questions were answered.   She has a history of right-sided total mastectomy with Diep flap reconstruction.  She then had a left-sided mastopexy for symmetry.  Ever since her mastopexy, her left NAC has been hypopigmented and she is hoping that she can receive nipple areolar restoration bilaterally.  DESCRIPTION OF PROCEDURE: After written informed consent was obtained and proper identification of patient and surgical site was made, the patient was taken to the procedure room and placed supine on the operating room table. A time out was performed to confirm patient's identity and surgical site. The patient was prepped and draped in the usual sterile fashion. Alcohol swabs were used. Attention was turned to the selection of flesh colored permanent tattoo ink to produce the appropriate color for nipple areolar complex tattooing.  Nipple position was then chosen with patient.  Using a digital revo 7R tattoo head, pointillism pigment was instilled to the designed nipple areolar complex which was confirmed preoperatively with the patient using the Digital-Pop Deluxe machine by [employer]. Spiral technique was used to fill out the areola. Once adequate pigment had been applied to the nipple areolar complex, Xeroform dressing  was applied followed by 4 x 4 gauze and secured with Medipore tape. There were no complications and the procedure was tolerated without difficulty.    Areola-color: 2:4:8:3 ratio of: Warm Peach (2), Bright Peach (4), Fair Peach (8) , Portrait White (3).  Additional: Will do additional color for nipple and shading next encounter.  Colors EXP: 04/2024  Nipples are each approximately 4.5 cm diameter at conclusion of visit. Return in 8 weeks for 2 hour scheduled follow-up for areolar coloring, nipple shading, and [place] tubercles.

## 2022-01-31 ENCOUNTER — Other Ambulatory Visit: Payer: Self-pay | Admitting: Hematology and Oncology

## 2022-02-02 ENCOUNTER — Other Ambulatory Visit: Payer: Self-pay | Admitting: Hematology and Oncology

## 2022-02-02 MED ORDER — ZOLPIDEM TARTRATE 5 MG PO TABS: ORAL_TABLET | ORAL | 3 refills | Status: DC

## 2022-02-04 DIAGNOSIS — H40013 Open angle with borderline findings, low risk, bilateral: Secondary | ICD-10-CM | POA: Diagnosis not present

## 2022-02-06 ENCOUNTER — Ambulatory Visit (INDEPENDENT_AMBULATORY_CARE_PROVIDER_SITE_OTHER): Payer: Self-pay | Admitting: Plastic Surgery

## 2022-02-06 ENCOUNTER — Encounter: Payer: Self-pay | Admitting: Plastic Surgery

## 2022-02-06 DIAGNOSIS — Z719 Counseling, unspecified: Secondary | ICD-10-CM

## 2022-02-06 NOTE — Progress Notes (Signed)
Preoperative Dx: right chest vascularity  Postoperative Dx:  same  Procedure: laser to right chest   Anesthesia: none  Description of Procedure:  Risks and complications were explained to the patient. Consent was confirmed and signed. Eye protection was placed. Time out was called and all information was confirmed to be correct. The area  area was prepped with alcohol and wiped dry. The BBL laser was set at 560 nm at 8 J/cm2. The right chest was lasered. The patient tolerated the procedure well and there were no complications. The patient is to follow up in 4 weeks.

## 2022-02-17 ENCOUNTER — Other Ambulatory Visit: Payer: Self-pay | Admitting: Hematology and Oncology

## 2022-02-26 ENCOUNTER — Other Ambulatory Visit: Payer: Medicare Other

## 2022-03-02 ENCOUNTER — Ambulatory Visit (INDEPENDENT_AMBULATORY_CARE_PROVIDER_SITE_OTHER): Payer: Medicare Other | Admitting: Family Medicine

## 2022-03-02 ENCOUNTER — Encounter: Payer: Self-pay | Admitting: Family Medicine

## 2022-03-02 VITALS — BP 110/76 | HR 64 | Temp 98.4°F | Ht 63.0 in | Wt 153.0 lb

## 2022-03-02 DIAGNOSIS — R519 Headache, unspecified: Secondary | ICD-10-CM | POA: Diagnosis not present

## 2022-03-02 DIAGNOSIS — H938X3 Other specified disorders of ear, bilateral: Secondary | ICD-10-CM

## 2022-03-02 NOTE — Progress Notes (Signed)
Phone [phone] In person visit   Subjective:   Patricia Chandler is a 66 y.o. year old very pleasant female patient who presents for/with See problem oriented charting Chief Complaint  Patient presents with   Headache    Pt wants referral to ENT state she has issues with both ears and also has a headache x 2 weeks. Ibuprofen nasacort for symptoms   Past Medical History-  Patient Active Problem List   Diagnosis Date Noted   Port catheter in place 01/08/2016    Priority: High   Breast cancer of upper-inner quadrant of right female breast ([facility]) 12/18/2015    Priority: High   Osteoporosis 05/07/2020    Priority: Medium    Acute diverticulitis 06/14/2018    Priority: Medium    Hypokalemia 03/30/2016    Priority: Medium    Chemotherapy-induced thrombocytopenia 03/30/2016    Priority: Medium    GERD (gastroesophageal reflux disease) 01/08/2016    Priority: Medium    Acute bursitis of right shoulder 06/08/2017    Priority: Low   Acute shoulder bursitis, left 03/08/2017    Priority: Low   DDD (degenerative disc disease), cervical 10/08/2016    Priority: Low   Genetic testing 01/10/2016    Priority: Low   History of sepsis 01/08/2016    Priority: Low   Family history of breast cancer     Priority: Low   Internal hemorrhoid 01/26/2011    Priority: Low   Encounter for counseling 12/02/2021   Acquired absence of nipple 09/16/2021   Snoring 01/23/2020   Insomnia 01/08/2020   Liver lesion 06/21/2018   Intra-abdominal abscess ([facility]) 06/14/2018    Medications- reviewed and updated Current Outpatient Medications  Medication Sig Dispense Refill   anastrozole (ARIMIDEX) 1 MG tablet TAKE 1 TABLET BY MOUTH DAILY 90 tablet 3   Biotin 1 MG CAPS Take by mouth.     calcium-vitamin D (OSCAL WITH D) 500-200 MG-UNIT tablet Take 1 tablet by mouth daily with breakfast.     denosumab (PROLIA) 60 MG/ML SOLN injection Inject 60 mg into the skin every 6 (six) months. Administer in  upper arm, thigh, or abdomen     Lactobacillus (PROBIOTIC ACIDOPHILUS PO) Take 1 capsule by mouth daily.      omeprazole (PRILOSEC) 20 MG capsule Take 20 mg by mouth daily as needed (heartburn).     Turmeric POWD Take by mouth.     zolpidem (AMBIEN) 5 MG tablet TAKE 1 TABLET BY MOUTH EACH NIGHT AT BEDTIME 30 tablet 3   No current facility-administered medications for this visit.     Objective:  BP 110/76 (BP Location: Left Arm, Patient Position: Sitting, Cuff Size: Normal)   Pulse 64   Temp 98.4 F (36.9 C) (Temporal)   Ht '5\' 3"'$  (1.6 m)   Wt 153 lb (69.4 kg)   SpO2 99%   BMI 27.10 kg/m  Gen: NAD, resting comfortably TM normal (but cannot test for motion of drum with our equipment), pharynx largely normal, no sinus pressure, nasal turbinates slightly edematous  CV: RRR no murmurs rubs or gallops Lungs: CTAB no crackles, wheeze, rhonchi Ext: no edema Skin: warm, dry Neuro: CN II-XII intact, sensation and reflexes normal throughout, 5/5 muscle strength in bilateral upper and lower extremities. Normal finger to nose. Normal rapid alternating movements. No pronator drift. Normal romberg. Normal gait.      Assessment and Plan   #New onset headache x2 weeks. Ear issues bilaterally  S: Patient complains of new onset headache starting  about 2 weeks ago.  Ibuprofen has been somewhat helpful more recently but at first was not helpful at all- headaches would even wake her up from sleep- were severe. Thinking back wonders if had covid a few weeks ago- headache that is starting to get better now. Some neck pressure going down into shoulder blades now easing up. Tested negative at home on a few occasions but still wonders if had covid. No fever or chills. Has noted some fatigue as well- still bothering her. Feels better if walks but hard to motivate to take walk in first place due to fatigue. No chest pain  at all or shortness of breath above baseline. No fever/chills. No nausea or vomiting. No  history of headaches. Almost sensation of sinus fullness- when wearing her oral appliance- having checked in 2 days - Send [place] date of covid shot from september  Also has noted ear popping/fullness for 2 weeks- hearing slightly off- almost buzzing like sensation.  Has trialed Nasacort intermittently- not sure if helping  ROS-  No facial or extremity weakness. No slurred words or trouble swallowing. no blurry vision or double vision. No paresthesias. No confusion or word finding difficulties.  A/P: 66 year old female with recent headache/ear fullness/pressure after possible viral illness.  Reassuring neurological exam today.  She is a breast cancer survivor and over age 82 (also initially woke her up from sleep but that has resolved) and we discussed I had a lower threshold for neuroimaging.  Since she has had such substantial improvement we opted to monitor and if fails to continue to improve would plan on MRI of the brain. Ear fullness could be OME- we discussed trial nasacort for 2 weeks and can refer to ENT if not improving From [facility] "  I agree with you - possible covid or other viral illness likely triggered headaches and ear issues  I am thrilled you are improving. Lets try nasacort two sprays each nostril daily for 2 weeks. If no improvement or worsening or recurrence of symptoms we discussed MRI of the brain for headache portion or referral to ENT for ear portion -for fatigue portion could also have you back for cbc, cmp, tsh- labs to evaluate fatigue  Update me in 2 weeks from today regardless of how things go so I can weigh in on next steps  Send [place] date of covid shot from september  Recommended follow up: Return for as needed for new, worsening, persistent symptoms."  Recommended follow up: Return for as needed for new, worsening, persistent symptoms. Future Appointments  Date Time Provider [place]  03/09/2022  8:15 AM Corena Herter, PA-C [facility] None  04/30/2022 10:30 AM  [facility]BILE DEXA [facility]-BREAST CE  06/08/2022 10:10 AM GI-315 MR 2 GI-315MRI GI-315 [facility] [facility]  09/10/2022  9:30 AM [facility] None  09/10/2022 10:00 AM Causey, Charlestine Massed, NP [facility] None  09/25/2022 10:00 AM [name], MD [facility]   Lab/Order associations:   ICD-10-CM   1. New onset of headaches after age 66  R51.9     2. Sensation of fullness in both ears  H93.8X3      Time Spent: 28 minutes of total time (10:40 - 11:08 AM) was spent on the date of the encounter performing the following actions: chart review prior to seeing the patient, obtaining history, performing a medically necessary exam, counseling on the treatment plan and follow up instructions, and documenting in our EHR.    No orders of the defined types  were placed in this encounter.   Return precautions advised.  Garret Reddish, MD

## 2022-03-02 NOTE — Patient Instructions (Addendum)
I agree with you - possible covid or other viral illness likely triggered headaches and ear issues  I am thrilled you are improving. Lets try nasacort two sprays each nostril daily for 2 weeks. If no improvement or worsening or recurrence of symptoms we discussed MRI of the brain for headache portion or referral to ENT for ear portion -for fatigue portion could also have you back for cbc, cmp, tsh- labs to evaluate fatigue  Update me in 2 weeks from today regardless of how things go so I can weigh in on next steps  Send [place] date of covid shot from september  Recommended follow up: Return for as needed for new, worsening, persistent symptoms.

## 2022-03-05 NOTE — Telephone Encounter (Signed)
Patient requests to be scheduled for her next Prolia injection at end of February 2024 and wants to make sure any PA required is done prior to end of February 2024

## 2022-03-06 NOTE — Progress Notes (Unsigned)
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  JUSTIFICATION FOR PROCEDURE:  Ms. Devos is a 65 y.o. female with a history of breast cancer status post breast reconstruction. The patient presents for nipple areolar complex tattoo. Risks, benefits, indications, and alternatives of the above described procedures were discussed with the patient and all the patient's questions were answered.   She tells me that she would like to be less "peachy" and have more of a pink color to the areolas.  She tells me that she would like her right nipple to be dark, even though her left nipple will likely be lighter in comparison given its hypopigmentation.  Engaged in [place] with patient we will not tattoo the left nipple, only the areola.  She reports that she did well with keeping the tattoo moist after last treatment.  DESCRIPTION OF PROCEDURE: After written informed consent was obtained and proper identification of patient and surgical site was made, the patient was taken to the procedure room and placed supine on the operating room table. A time out was performed to confirm patient's identity and surgical site. The patient was prepped and draped in the usual sterile fashion. Alcohol swabs were used. Attention was turned to the selection of flesh colored permanent tattoo ink to produce the appropriate color for nipple areolar complex tattooing.  Nipple position was then chosen with patient.  Using a digital revo 7R tattoo head, pointillism pigment was instilled to the designed nipple areolar complex which was confirmed preoperatively with the patient using the Digital-Pop Deluxe machine by  [employer]. Spiral technique was used to fill out the areola. Once adequate pigment had been applied to the nipple areolar complex, Xeroform dressing was applied followed by 4 x 4 gauze and secured with Medipore tape. There were no complications and the procedure was tolerated without difficulty.    Areola-color: Tan Peach (1), Warm Peach (2), Fair Honey (3), and Portrait White (4) in 1:2:3:4 ratio respectively.   Nipple-color: Tan Peach (1), Warm Peach (2), Fair Honey (3) in 1:2:3 ratio respectively. Additional: Consideration of nipple shading to be Warm Mink (1) and Tan Peach (2) in 1:2 ratio respectively. Colors EXP: 04/2024  Nipples are each approximately 4.5 cm diameter at conclusion of visit. Return in 8-12 weeks for 1.5 hour scheduled touch-up for [place] tubercles, nipple shading, and possible nipple coloring.

## 2022-03-09 ENCOUNTER — Other Ambulatory Visit (HOSPITAL_COMMUNITY): Payer: Self-pay

## 2022-03-09 ENCOUNTER — Ambulatory Visit: Payer: Medicare Other | Admitting: Physician Assistant

## 2022-03-09 ENCOUNTER — Encounter: Payer: Self-pay | Admitting: Physician Assistant

## 2022-03-09 VITALS — BP 99/73 | HR 90

## 2022-03-09 DIAGNOSIS — Z853 Personal history of malignant neoplasm of breast: Secondary | ICD-10-CM

## 2022-03-09 DIAGNOSIS — Z9011 Acquired absence of right breast and nipple: Secondary | ICD-10-CM

## 2022-03-09 DIAGNOSIS — C50211 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 female breast: Secondary | ICD-10-CM

## 2022-03-09 NOTE — Telephone Encounter (Signed)
Last Prolia injection 06/17/21

## 2022-03-09 NOTE — Telephone Encounter (Signed)
Forwarding to Rx prior auth team.

## 2022-03-09 NOTE — Telephone Encounter (Signed)
Prolia VOB initiated via [email or web address] 

## 2022-03-09 NOTE — Telephone Encounter (Signed)
Pharmacy Patient Advocate Encounter  Insurance verification completed.    The patient is insured through [employer]   Ran test claims for: Prolia '60mg'$ .  Pharmacy benefit copay: $300.00

## 2022-03-15 ENCOUNTER — Encounter: Payer: Self-pay | Admitting: Family Medicine

## 2022-03-16 NOTE — Addendum Note (Signed)
Addended by: [name] on: 03/16/2022 08:36 PM   Modules accepted: Orders

## 2022-04-02 ENCOUNTER — Ambulatory Visit
Admission: RE | Admit: 2022-04-02 | Discharge: 2022-04-02 | Disposition: A | Discharge status: Home / Self Care | Payer: Medicare Other | Source: Ambulatory Visit | Attending: Family Medicine | Admitting: Family Medicine

## 2022-04-02 DIAGNOSIS — R519 Headache, unspecified: Secondary | ICD-10-CM | POA: Diagnosis not present

## 2022-04-22 ENCOUNTER — Other Ambulatory Visit (HOSPITAL_COMMUNITY): Payer: Self-pay

## 2022-04-22 NOTE — Telephone Encounter (Signed)
Prolia VOB initiated via [email or web address] 

## 2022-04-22 NOTE — Telephone Encounter (Signed)
Patient states: - She hadn't heard anything in regards to scheduling upcoming prolia injection - She was instructed to skip last prolia injection due in August for medical reasons   Patient requests: -A callback once information has been verified so she can schedule herself in February.

## 2022-04-25 ENCOUNTER — Telehealth: Payer: Medicare Other | Admitting: Nurse Practitioner

## 2022-04-25 DIAGNOSIS — J9801 Acute bronchospasm: Secondary | ICD-10-CM | POA: Diagnosis not present

## 2022-04-25 DIAGNOSIS — U071 COVID-19: Secondary | ICD-10-CM | POA: Diagnosis not present

## 2022-04-25 DIAGNOSIS — Z8701 Personal history of pneumonia (recurrent): Secondary | ICD-10-CM | POA: Diagnosis not present

## 2022-04-25 MED ORDER — AZITHROMYCIN 250 MG PO TABS: ORAL_TABLET | ORAL | 0 refills | Status: DC

## 2022-04-25 MED ORDER — PREDNISONE 20 MG PO TABS: 40.0000 mg | ORAL_TABLET | Freq: Every day | ORAL | 0 refills | Status: AC

## 2022-04-25 MED ORDER — PREDNISONE 20 MG PO TABS
40.0000 mg | ORAL_TABLET | Freq: Every day | ORAL | 0 refills | Status: DC
Start: 2022-04-25 — End: 2022-04-25

## 2022-04-25 NOTE — Progress Notes (Signed)
Virtual Visit Consent   Patricia Chandler, you are scheduled for a virtual visit with Patricia Hassell Done, FNP, a [facility] provider, today.     Just as with appointments in the office, your consent must be obtained to participate.  Your consent will be active for this visit and any virtual visit you may have with one of our providers in the next 365 days.     If you have a MyChart account, a copy of this consent can be sent to you electronically.  All virtual visits are billed to your insurance company just like a traditional visit in the office.    As this is a virtual visit, video technology does not allow for your provider to perform a traditional examination.  This may limit your provider's ability to fully assess your condition.  If your provider identifies any concerns that need to be evaluated in person or the need to arrange testing (such as labs, EKG, etc.), we will make arrangements to do so.     Although advances in technology are sophisticated, we cannot ensure that it will always work on either your end or our end.  If the connection with a video visit is poor, the visit may have to be switched to a telephone visit.  With either a video or telephone visit, we are not always able to ensure that we have a secure connection.     I need to obtain your verbal consent now.   Are you willing to proceed with your visit today? YES   Alijah Hyde has provided verbal consent on 04/25/2022 for a virtual visit (video or telephone).   Patricia Hassell Done, FNP   Date: 04/25/2022 4:01 PM   Virtual Visit via Video Note   I, Patricia Chandler, connected with Patricia Chandler ([id number], January 28, 1956) on 04/25/22 at  4:30 PM EST by a video-enabled telemedicine application and verified that I am speaking with the correct person using two identifiers.  Location: Patient: Virtual Visit Location Patient: Home Provider: Virtual Visit Location Provider: Mobile    I discussed the limitations of evaluation and management by telemedicine and the availability of in person appointments. The patient expressed understanding and agreed to proceed.    History of Present Illness: Patricia Chandler is a 67 y.o. who identifies as a female who was assigned female at birth, and is being seen today for covid positive.  HPI: Patient had positive covid results yesterday. Symptoms started 6 days ago and was feeling better. But developed a cough. Has history of bronchitis and pneumonia  URI  This is a new problem. The current episode started yesterday. The problem has been gradually worsening. There has been no fever. Associated symptoms include coughing and rhinorrhea. Pertinent negatives include no congestion. She has tried nothing for the symptoms. The treatment provided mild relief.    Review of Systems  HENT:  Positive for rhinorrhea. Negative for congestion.   Respiratory:  Positive for cough.     Problems:  Patient Active Problem List   Diagnosis Date Noted   Encounter for counseling 12/02/2021   Acquired absence of nipple 09/16/2021   Osteoporosis 05/07/2020   Snoring 01/23/2020   Insomnia 01/08/2020   Liver lesion 06/21/2018   Intra-abdominal abscess ([place]) 06/14/2018   Acute diverticulitis 06/14/2018   Acute bursitis of right shoulder 06/08/2017   Acute shoulder bursitis, left 03/08/2017   DDD (degenerative disc disease), cervical 10/08/2016   Hypokalemia 03/30/2016   Chemotherapy-induced thrombocytopenia 03/30/2016   Genetic  testing 01/10/2016   Port catheter in place 01/08/2016   History of sepsis 01/08/2016   GERD (gastroesophageal reflux disease) 01/08/2016   Breast cancer of upper-inner quadrant of right female breast ([place]) 12/18/2015   Family history of breast cancer    Internal hemorrhoid 01/26/2011    Allergies: No Known Allergies Medications:  Current Outpatient Medications:    anastrozole (ARIMIDEX) 1 MG tablet, TAKE 1  TABLET BY MOUTH DAILY, Disp: 90 tablet, Rfl: 3   Biotin 1 MG CAPS, Take by mouth., Disp: , Rfl:    calcium-vitamin D (OSCAL WITH D) 500-200 MG-UNIT tablet, Take 1 tablet by mouth daily with breakfast., Disp: , Rfl:    denosumab (PROLIA) 60 MG/ML SOLN injection, Inject 60 mg into the skin every 6 (six) months. Administer in upper arm, thigh, or abdomen, Disp: , Rfl:    Lactobacillus (PROBIOTIC ACIDOPHILUS PO), Take 1 capsule by mouth daily. , Disp: , Rfl:    omeprazole (PRILOSEC) 20 MG capsule, Take 20 mg by mouth daily as needed (heartburn)., Disp: , Rfl:    Turmeric POWD, Take by mouth., Disp: , Rfl:    zolpidem (AMBIEN) 5 MG tablet, TAKE 1 TABLET BY MOUTH EACH NIGHT AT BEDTIME, Disp: 30 tablet, Rfl: 3  Observations/Objective: Patient is well-developed, well-nourished in no acute distress.  Resting comfortably  at home.  Head is normocephalic, atraumatic.  No labored breathing.  Speech is clear and coherent with logical content.  Patient is alert and oriented at baseline.  Dry cough  Assessment and Plan:  Durwin Reges in today with chief complaint of Covid Positive   1. Cough due to bronchospasm  2. Positive self-administered antigen test for COVID-19  3. Hx: recurrent pneumonia 1. Take meds as prescribed 2. Use a cool mist humidifier especially during the winter months and when heat has been humid. 3. Use saline nose sprays frequently 4. Saline irrigations of the nose can be very helpful if Chandler frequently.  * 4X daily for 1 week*  * Use of a nettie pot can be helpful with this. Follow directions with this* 5. Drink plenty of fluids 6. Keep thermostat turn down low 7.For any cough or congestion- tessalon perles 8. For fever or aces or pains- take tylenol or ibuprofen appropriate for age and weight.  * for fevers greater than 101 orally you may alternate ibuprofen and tylenol every  3 hours.     Meds ordered this encounter  Medications   predniSONE  (DELTASONE) 20 MG tablet    Sig: Take 2 tablets (40 mg total) by mouth daily with breakfast for 5 days. 2 po daily for 5 days    Dispense:  10 tablet    Refill:  0    Order Specific Question:   Supervising Provider    Answer:   Chase Picket [[id number]]   azithromycin (ZITHROMAX Z-PAK) 250 MG tablet    Sig: As directed    Dispense:  6 tablet    Refill:  0    Order Specific Question:   Supervising Provider    Answer:   Chase Picket [id number]      Follow Up Instructions: I discussed the assessment and treatment plan with the patient. The patient was provided an opportunity to ask questions and all were answered. The patient agreed with the plan and demonstrated an understanding of the instructions.  A copy of instructions were sent to the patient via MyChart.  The patient was advised to call back or seek an in-person  evaluation if the symptoms worsen or if the condition fails to improve as anticipated.  Time:  I spent 6 minutes with the patient via telehealth technology discussing the above problems/concerns.    Patricia Hassell Done, FNP

## 2022-04-25 NOTE — Patient Instructions (Signed)
Patricia Chandler, thank you for joining Chevis Pretty, FNP for today's virtual visit.  While this provider is not your primary care provider (PCP), if your PCP is located in our provider database this encounter information will be shared with them immediately following your visit.   [place] account gives you access to today's visit and all your visits, tests, and labs performed at [facility] " click here if you don't have a [facility] account or go to [email or web address]  Consent: (Patient) Patricia Chandler provided verbal consent for this virtual visit at the beginning of the encounter.  Current Medications:  Current Outpatient Medications:    azithromycin (ZITHROMAX Z-PAK) 250 MG tablet, As directed, Disp: 6 tablet, Rfl: 0   predniSONE (DELTASONE) 20 MG tablet, Take 2 tablets (40 mg total) by mouth daily with breakfast for 5 days. 2 po daily for 5 days, Disp: 10 tablet, Rfl: 0   anastrozole (ARIMIDEX) 1 MG tablet, TAKE 1 TABLET BY MOUTH DAILY, Disp: 90 tablet, Rfl: 3   Biotin 1 MG CAPS, Take by mouth., Disp: , Rfl:    calcium-vitamin D (OSCAL WITH D) 500-200 MG-UNIT tablet, Take 1 tablet by mouth daily with breakfast., Disp: , Rfl:    denosumab (PROLIA) 60 MG/ML SOLN injection, Inject 60 mg into the skin every 6 (six) months. Administer in upper arm, thigh, or abdomen, Disp: , Rfl:    Lactobacillus (PROBIOTIC ACIDOPHILUS PO), Take 1 capsule by mouth daily. , Disp: , Rfl:    omeprazole (PRILOSEC) 20 MG capsule, Take 20 mg by mouth daily as needed (heartburn)., Disp: , Rfl:    Turmeric POWD, Take by mouth., Disp: , Rfl:    zolpidem (AMBIEN) 5 MG tablet, TAKE 1 TABLET BY MOUTH EACH NIGHT AT BEDTIME, Disp: 30 tablet, Rfl: 3   Medications ordered in this encounter:  Meds ordered this encounter  Medications   predniSONE (DELTASONE) 20 MG tablet    Sig: Take 2 tablets (40 mg total) by mouth daily with breakfast for 5 days. 2  po daily for 5 days    Dispense:  10 tablet    Refill:  0    Order Specific Question:   Supervising Provider    Answer:   Chase Picket [[id number]]   azithromycin (ZITHROMAX Z-PAK) 250 MG tablet    Sig: As directed    Dispense:  6 tablet    Refill:  0    Order Specific Question:   Supervising Provider    Answer:   Chase Picket [id number]     *If you need refills on other medications prior to your next appointment, please contact your pharmacy*  Follow-Up: Call back or seek an in-person evaluation if the symptoms worsen or if the condition fails to improve as anticipated.  [facility] [phone]  Other Instructions 1. Take meds as prescribed 2. Use a cool mist humidifier especially during the winter months and when heat has been humid. 3. Use saline nose sprays frequently 4. Saline irrigations of the nose can be very helpful if done frequently.  * 4X daily for 1 week*  * Use of a nettie pot can be helpful with this. Follow directions with this* 5. Drink plenty of fluids 6. Keep thermostat turn down low 7.For any cough or congestion- tessalon perles 8. For fever or aces or pains- take tylenol or ibuprofen appropriate for age and weight.  * for fevers greater than 101 orally you may alternate ibuprofen and  tylenol every  3 hours.      If you have been instructed to have an in-person evaluation today at a local Urgent Care facility, please use the link below. It will take you to a list of all of our available [facility], including address, phone number and hours of operation. Please do not delay care.  [facility]  If you or a family member do not have a primary care provider, use the link below to schedule a visit and establish care. When you choose a [facility] primary care physician or advanced practice provider, you gain a long-term partner in health. Find a Primary Care Provider  Learn more about [facility] in-office and  virtual care options: [facility]

## 2022-04-25 NOTE — Addendum Note (Signed)
Addended by: Chevis Pretty on: 04/25/2022 05:25 PM   Modules accepted: Orders

## 2022-04-29 ENCOUNTER — Telehealth: Payer: Self-pay | Admitting: Family Medicine

## 2022-04-29 ENCOUNTER — Encounter: Payer: Self-pay | Admitting: Family Medicine

## 2022-04-29 NOTE — Telephone Encounter (Signed)
Can you look at this? It wont allow me to re order.

## 2022-04-29 NOTE — Telephone Encounter (Signed)
Noted  

## 2022-04-29 NOTE — Telephone Encounter (Signed)
Message sent to pt via mychart

## 2022-04-29 NOTE — Telephone Encounter (Signed)
Called Patricia Chandler and explained that the cost thru her Medical coverage will be $396 and her insurance pharmacy coverage will be $300. I explained that we can do it either way she prefers. She stated that it hasn't been done that way in the past, but she understand that it may be changes in her insurance. Patricia Chandler decided to pick up Prolia from pharmacy and bring it to her nurse visit on 2/27. She wants [place] to hold on sending Rx till 2/27.  I ensured her that I will put a reminder to send it to her preferred pharmacy on 2/27. She thanks [place] for the call and the explanation.

## 2022-04-29 NOTE — Telephone Encounter (Signed)
  Encourage patient to contact the pharmacy for refills or they can request refills through [facility]:  Please schedule appointment if longer than 1 year  NEXT APPOINTMENT DATE:  MEDICATION:  denosumab (PROLIA) 60 MG/ML SOLN injection    Is the patient out of medication? YES   PHARMACY:  Let patient know to contact pharmacy at the end of the day to make sure medication is ready.  Please notify patient to allow 48-72 hours to process

## 2022-04-29 NOTE — Telephone Encounter (Signed)
Patient is scheduled for Prolia injection on 06/18/22-Patient does not want RX for Prolia sent to a Pharmacy (a misunderstanding.)

## 2022-04-30 ENCOUNTER — Ambulatory Visit
Admission: RE | Admit: 2022-04-30 | Discharge: 2022-04-30 | Disposition: A | Discharge status: Home / Self Care | Payer: Medicare Other | Source: Ambulatory Visit | Attending: Hematology and Oncology | Admitting: Hematology and Oncology

## 2022-04-30 DIAGNOSIS — Z780 Asymptomatic menopausal state: Secondary | ICD-10-CM | POA: Diagnosis not present

## 2022-04-30 DIAGNOSIS — M85852 Other specified disorders of bone density and structure, left thigh: Secondary | ICD-10-CM | POA: Diagnosis not present

## 2022-05-04 ENCOUNTER — Ambulatory Visit (INDEPENDENT_AMBULATORY_CARE_PROVIDER_SITE_OTHER): Payer: Medicare Other | Admitting: Physician Assistant

## 2022-05-04 DIAGNOSIS — Z853 Personal history of malignant neoplasm of breast: Secondary | ICD-10-CM | POA: Diagnosis not present

## 2022-05-04 DIAGNOSIS — Z170 Estrogen receptor positive status [ER+]: Secondary | ICD-10-CM

## 2022-05-04 DIAGNOSIS — Z9011 Acquired absence of right breast and nipple: Secondary | ICD-10-CM

## 2022-05-04 NOTE — Progress Notes (Signed)
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   JUSTIFICATION FOR PROCEDURE:  Patricia Chandler is a 67 y.o. female with a history of breast cancer status post breast reconstruction. The patient presents for nipple areolar complex tattoo. Risks, benefits, indications, and alternatives of the above described procedures were discussed with the patient and all the patient's questions were answered.    03/09/2022 She tells me that she would like to be less "peachy" and have more of a pink color to the areolas.  She tells me that she would like her right nipple to be dark, even though her left nipple will likely be lighter in comparison given its hypopigmentation.  Engaged in [place] with patient we will not tattoo the left nipple, only the areola.  She reports that she did well with keeping the tattoo moist after last treatment.  05/04/2022 Today, she tells me that she is happier, but still feels peachy.  Agreeable to a more tan areolar pigment at this time.  Otherwise pleased with size, shape, and location on breasts.  No issues since last tattoo.   DESCRIPTION OF PROCEDURE: After written informed consent was obtained and proper identification of patient and surgical site was made, the patient was taken to the procedure room and placed supine on the operating room table. A time out was performed to confirm patient's identity and surgical site. The patient was prepped and draped in the usual sterile fashion. Alcohol swabs were used. Attention was turned to the selection of flesh colored permanent tattoo ink to produce the appropriate color for nipple areolar complex tattooing.  Nipple  position was then chosen with patient.  Using a digital revo 7R tattoo head, pointillism pigment was instilled to the designed nipple areolar complex which was confirmed preoperatively with the patient using the Digital-Pop Deluxe machine by [employer]. Spiral technique was used to fill out the areola.  7R tattoo head was then used for tattooing of the right nipple.  1P tattoo head was then used for right-sided nipple shading.  Once adequate pigment had been applied to the nipple areolar complex, Xeroform dressing was applied followed by 4 x 4 gauze and secured with Medipore tape. There were no complications and the procedure was tolerated without difficulty.    Areola-color: Fair Honey (3), Fair Peach (1), Tan Peach (1), Cool Honey (1), and Portrait White (1) in 3:1:1:1:1 ratio. Nipple-color: Fair Honey (2), Fair Peach (1), Tan Peach (1), and Cool Honey (1) in 2:1:1:1 ratio. Additional: Nipple shading Warm Mink (1) and Tan Peach (2) in 1:2 ratio respectively. Colors EXP: 04/2024   Nipples are each approximately 4.5 cm diameter at conclusion of visit. Return in 8-12 weeks for 1.5 hour scheduled touch-up for [place] tubercles, nipple shading, and possible nipple coloring.

## 2022-05-05 ENCOUNTER — Other Ambulatory Visit (HOSPITAL_COMMUNITY): Payer: Self-pay

## 2022-05-05 NOTE — Telephone Encounter (Addendum)
Pt ready for scheduling on or after 05/05/22   Out-of-pocket cost due at time of visit: $296   Primary: [place] Medicare Adv Prolia co-insurance: 20% (approximately $276) Admin fee co-insurance: $20   Secondary: n/a Prolia co-insurance:  Admin fee co-insurance:    Deductible: does not apply   Prior Auth: APPROVED (buy and bill) PA# [id number] Valid: 11/22/21-11/23/22   ** This summary of benefits is an estimation of the patient's out-of-pocket cost. Exact cost may vary based on individual plan coverage.

## 2022-05-06 NOTE — Telephone Encounter (Signed)
Called pt back. Rescheduled her nurse visit to 05/20/22 and scheduled her next dose for 11/19/22.

## 2022-05-06 NOTE — Telephone Encounter (Signed)
Patient returned call. Requests Patricia Chandler call her at ph# [phone].

## 2022-05-06 NOTE — Telephone Encounter (Signed)
Pt scheduled for 05/20/22 and 11/19/22 for Prolia.

## 2022-05-20 ENCOUNTER — Ambulatory Visit (INDEPENDENT_AMBULATORY_CARE_PROVIDER_SITE_OTHER): Payer: Medicare Other

## 2022-05-20 DIAGNOSIS — M810 Age-related osteoporosis without current pathological fracture: Secondary | ICD-10-CM

## 2022-05-20 MED ORDER — DENOSUMAB 60 MG/ML ~~LOC~~ SOSY
60.0000 mg | PREFILLED_SYRINGE | Freq: Once | SUBCUTANEOUS | Status: AC
  Administered 2022-05-20: 60 mg via SUBCUTANEOUS

## 2022-05-26 ENCOUNTER — Other Ambulatory Visit: Payer: Self-pay | Admitting: Hematology and Oncology

## 2022-06-08 ENCOUNTER — Ambulatory Visit
Admission: RE | Admit: 2022-06-08 | Discharge: 2022-06-08 | Disposition: A | Discharge status: Home / Self Care | Payer: Medicare Other | Source: Ambulatory Visit | Attending: Hematology and Oncology | Admitting: Hematology and Oncology

## 2022-06-08 DIAGNOSIS — R928 Other abnormal and inconclusive findings on diagnostic imaging of breast: Secondary | ICD-10-CM | POA: Diagnosis not present

## 2022-06-08 DIAGNOSIS — C50211 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 female breast: Secondary | ICD-10-CM

## 2022-06-08 DIAGNOSIS — Z1239 Encounter for other screening for malignant neoplasm of breast: Secondary | ICD-10-CM | POA: Diagnosis not present

## 2022-06-08 MED ORDER — GADOPICLENOL 0.5 MMOL/ML IV SOLN
7.0000 mL | Freq: Once | INTRAVENOUS | Status: AC | PRN
  Administered 2022-06-08: 7 mL via INTRAVENOUS

## 2022-06-16 NOTE — Progress Notes (Unsigned)
Patricia Chandler [address] [place] Phone: [phone] Subjective:   Patricia Chandler, am serving as a scribe for Dr. Hulan Saas.  I'm seeing this patient by the request  of:  Patricia Olp, MD  CC: left shoulder   [id number]  Patricia Chandler is a 67 y.o. female coming in with complaint of L shoulder pain.  Patient states upper arm pain. Sharp pain when sleeping on that side. Dull ache when gardening and other times during the day. Pain will wake her at night. Feels similar to the bursitis pain she had in her right arm    Patient recently did have a MRI of the breast bilaterally with and without contrast.  The left breast did have an interval removal of the retropectoral saline implant likely no malignancy noted.  There was a note that they do recommend a left screening mammogram in August 2024.    Past Medical History:  Diagnosis Date   Anemia    due to chemo   Anxiety    Breast cancer ([facility]) dx'd 2017   right   Cardiomyopathy ([facility])    mild, per cardiology note; due to Herceptin   Dental crowns present    Family history of adverse reaction to anesthesia    pt's daughter has hx. of post-op nausea   GERD (gastroesophageal reflux disease)    Heart murmur    History of breast cancer 2017   right   History of chemotherapy    finished chemo 04/15/2016   Personal history of chemotherapy    Personal history of radiation therapy    Right Breast Cancer   Rash 08/04/2016   under right arm - states recent tick bite; to see MD 08/05/2016   Recurrent breast cancer, right ([facility]) dx'd 10/2018   Runny nose 06/06/2016   clear drainage, per pt.   Past Surgical History:  Procedure Laterality Date   AUGMENTATION MAMMAPLASTY Left    BREAST BIOPSY Right 11/30/2018   Procedure: RIGHT BREAST CHEST WALL RECURRENCE EXCISION;  Surgeon: Rolm Bookbinder, MD;  Location: [facility];  Service: General;  Laterality: Right;   BREAST  ENHANCEMENT SURGERY Bilateral 2005   breast mass removal Right 11/30/2018   BREAST RECONSTRUCTION WITH PLACEMENT OF TISSUE EXPANDER AND FLEX HD (ACELLULAR HYDRATED DERMIS) Right 05/19/2016   Procedure: RIGHT BREAST RECONSTRUCTION WITH PLACEMENT OF TISSUE EXPANDER AND  ALLODERM.;  Surgeon: Irene Limbo, MD;  Location: [facility];  Service: Plastics;  Laterality: Right;   COLONOSCOPY     COLONOSCOPY WITH PROPOFOL  10/02/2014   DEBRIDEMENT AND CLOSURE WOUND Right 06/08/2016   Procedure: DEBRIDEMENT RIGHT MASTECTOMY FLAP, TISSUE EXPANSION OF RIGHT CHEST;  Surgeon: Irene Limbo, MD;  Location: [facility];  Service: Plastics;  Laterality: Right;   MASTECTOMY Right    MASTOPEXY Left 08/11/2016   Procedure: LEFT BREAST MASTOPEXY;  Surgeon: Irene Limbo, MD;  Location: [facility];  Service: Plastics;  Laterality: Left;   NIPPLE SPARING MASTECTOMY/SENTINAL LYMPH NODE BIOPSY/RECONSTRUCTION/PLACEMENT OF TISSUE EXPANDER Right 05/19/2016   Procedure: RIGHT NIPPLE SPARING MASTECTOMY WITH SENTINAL LYMPH NODE BIOPSY WITH BLUE DYE INJECTION;  Surgeon: Rolm Bookbinder, MD;  Location: [facility];  Service: General;  Laterality: Right;   PORT-A-CATH REMOVAL Right 04/26/2019   Procedure: REMOVAL PORT-A-CATH;  Surgeon: Rolm Bookbinder, MD;  Location: [facility];  Service: General;  Laterality: Right;   PORTACATH PLACEMENT N/A 12/30/2015   Procedure: INSERTION PORT-A-CATH WITH [place];  Surgeon:  Rolm Bookbinder, MD;  Location: [facility];  Service: General;  Laterality: N/A;   PORTACATH PLACEMENT N/A 11/30/2018   Procedure: INSERTION PORT-A-CATH WITH ULTRASOUND;  Surgeon: Rolm Bookbinder, MD;  Location: [facility];  Service: General;  Laterality: N/A;   REDUCTION MAMMAPLASTY     REMOVAL OF TISSUE EXPANDER AND PLACEMENT OF IMPLANT Right 08/11/2016   Procedure: REMOVAL OF RIGHT  TISSUE EXPANDER AND PLACEMENT OF IMPLANT;  Surgeon: Irene Limbo,  MD;  Location: [facility];  Service: Plastics;  Laterality: Right;   Social History   Socioeconomic History   Marital status: Married    Spouse name: Not on file   Number of children: 2   Years of education: Not on file   Highest education level: Not on file  Occupational History   Occupation: retired  Tobacco Use   Smoking status: Never   Smokeless tobacco: Never  Vaping Use   Vaping Use: Never used  Substance and Sexual Activity   Alcohol use: Yes    Comment: occasionally   Drug use: No   Sexual activity: Not on file  Other Topics Concern   Not on file  Social History Narrative   Married 1978 (Patricia Chandler). 2 girls 33 and 36 in 2018. Patricia Mola (liz) lives in [place], [place] with 65 yo son (single right now). and Patricia Chandler, Patricia Chandler--> now [place] with husband and 48 year old daughter (born with heart defect with missing valve and initially not closed VSD--still weighs heavily on family- likely will have to be repaired).       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 Had been doing event design on hold.    Graduated from [employer]. [employer] prior.       Hobbies: gardening, decorating, sewing, enjoys creating   Social Determinants of Health   Financial Resource Strain: Low Risk  (08/05/2021)   Overall Financial Resource Strain (CARDIA)    Difficulty of Paying Living Expenses: Not hard at all  Food Insecurity: No Food Insecurity (11/28/2021)   Hunger Vital Sign    Worried About Running Out of Food in the Last Year: Never true    Ran Out of Food in the Last Year: Never true  Transportation Needs: No Transportation Needs (11/28/2021)   PRAPARE - Hydrologist (Medical): No    Lack of Transportation (Non-Medical): No  Physical Activity: Sufficiently Active (08/05/2021)   Exercise Vital Sign    Days of Exercise per Week: 5 days    Minutes of Exercise per Session: 60 min  Stress: No Stress Concern Present  (08/05/2021)   [place]    Feeling of Stress : Not at all  Social Connections: Moderately Isolated (08/05/2021)   Social Connection and Isolation Panel [NHANES]    Frequency of Communication with Friends and Family: More than three times a week    Frequency of Social Gatherings with Friends and Family: More than three times a week    Attends Religious Services: Never    Marine scientist or Organizations: No    Attends Music therapist: Never    Marital Status: Married   No Known Allergies Family History  Problem Relation Age of Onset   Breast cancer Maternal Aunt        dx in her 63s   Stroke Father        late 4s   Hypertension Father  Pulmonary fibrosis Mother    Breast cancer Paternal Aunt 66   Cancer Paternal Grandmother        abdominal; cervical vs uterine dx in her 71s   Stomach cancer Maternal Grandmother    Parkinson's disease Maternal Grandfather    Anesthesia problems Daughter        post-op nausea   Colon cancer Neg Hx    Esophageal cancer Neg Hx    Rectal cancer Neg Hx     Current Outpatient Medications (Endocrine & Metabolic):    denosumab (PROLIA) 60 MG/ML SOLN injection, Inject 60 mg into the skin every 6 (six) months. Administer in upper arm, thigh, or abdomen      Current Outpatient Medications (Other):    anastrozole (ARIMIDEX) 1 MG tablet, TAKE 1 TABLET BY MOUTH DAILY   Biotin 1 MG CAPS, Take by mouth.   calcium-vitamin D (OSCAL WITH D) 500-200 MG-UNIT tablet, Take 1 tablet by mouth daily with breakfast.   Lactobacillus (PROBIOTIC ACIDOPHILUS PO), Take 1 capsule by mouth daily.    omeprazole (PRILOSEC) 20 MG capsule, Take 20 mg by mouth daily as needed (heartburn).   Turmeric POWD, Take by mouth.   zolpidem (AMBIEN) 5 MG tablet, TAKE 1 TABLET BY MOUTH EACH NIGHT AT BEDTIME   Reviewed prior external information including notes and imaging from  primary care  provider As well as notes that were available from care everywhere and other healthcare systems.  Past medical history, social, surgical and family history all reviewed in electronic medical record.  No pertanent information unless stated regarding to the chief complaint.   Review of Systems:  No headache, visual changes, nausea, vomiting, diarrhea, constipation, dizziness, abdominal pain, skin rash, fevers, chills, night sweats, weight loss, swollen lymph nodes, body aches, joint swelling, chest pain, shortness of breath, mood changes. POSITIVE muscle aches  Objective  Blood pressure (!) 130/90, pulse 96, height '5\' 3"'$  (1.6 m), weight 154 lb (69.9 kg), SpO2 97 %.   General: No apparent distress alert and oriented x3 mood and affect normal, dressed appropriately.  HEENT: Pupils equal, extraocular movements intact  Respiratory: Patient's speak in full sentences and does not appear short of breath  Cardiovascular: No lower extremity edema, non tender, no erythema  Left shoulder exam shows the patient does have positive impingement noted.  Patient does have pain with Neer and Hawkins.  Patient does have positive crossover noted as well.  No masses appreciated.  Limited muscular skeletal ultrasound was performed and interpreted by Hulan Saas, M  Limited ultrasound shows the patient does have moderate narrowing of the acromioclavicular joint.  Rotator cuff appears to be intact but does have hypoechoic changes consistent with more of a bursitis.  Patient does have hypoechoic changes in the glenohumeral joint which is consistent with a small effusion. Impression: Acromioclavicular arthritis with small joint effusion and reactive bursitis  Procedure: Real-time Ultrasound Guided Injection of left glenohumeral joint Device: GE Logiq E  Ultrasound guided injection is preferred based studies that show increased duration, increased effect, greater accuracy, decreased procedural pain, increased response  rate with ultrasound guided versus blind injection.  Verbal informed consent obtained.  Time-out conducted.  Noted no overlying erythema, induration, or other signs of local infection.  Skin prepped in a sterile fashion.  Local anesthesia: Topical Ethyl chloride.  With sterile technique and under real time ultrasound guidance:  Joint visualized.  21g 2 inch needle inserted posterior approach. Pictures taken for needle placement. Patient did have injection of 2 cc of  0.5% Marcaine, and 1cc of Kenalog 40 mg/dL. Completed without difficulty  Pain immediately resolved suggesting accurate placement of the medication.  Advised to call if fevers/chills, erythema, induration, drainage, or persistent bleeding.  Images permanently stored and available for review in the ultrasound unit.  Impression: Technically successful ultrasound guided injection.  Procedure: Real-time Ultrasound Guided Injection of left acromioclavicular joint Device: GE Logiq Q7 Ultrasound guided injection is preferred based studies that show increased duration, increased effect, greater accuracy, decreased procedural pain, increased response rate, and decreased cost with ultrasound guided versus blind injection.  Verbal informed consent obtained.  Time-out conducted.  Noted no overlying erythema, induration, or other signs of local infection.  Skin prepped in a sterile fashion.  Local anesthesia: Topical Ethyl chloride.  With sterile technique and under real time ultrasound guidance: With a 25-gauge half inch needle injected with 0.5 cc of 0.5% Marcaine and 0.5 cc of Kenalog 40 mg. Completed without difficulty  Pain immediately resolved suggesting accurate placement of the medication.  Advised to call if fevers/chills, erythema, induration, drainage, or persistent bleeding.  Impression: Technically successful ultrasound guided injection.    Impression and Recommendations:     The above documentation has been reviewed and is  accurate and complete Lyndal Pulley, DO

## 2022-06-17 ENCOUNTER — Ambulatory Visit: Payer: Medicare Other | Admitting: Family Medicine

## 2022-06-17 ENCOUNTER — Ambulatory Visit (INDEPENDENT_AMBULATORY_CARE_PROVIDER_SITE_OTHER): Payer: Medicare Other

## 2022-06-17 ENCOUNTER — Ambulatory Visit: Payer: Self-pay

## 2022-06-17 VITALS — BP 130/90 | HR 96 | Ht 63.0 in | Wt 154.0 lb

## 2022-06-17 DIAGNOSIS — M25512 Pain in left shoulder: Secondary | ICD-10-CM | POA: Diagnosis not present

## 2022-06-17 DIAGNOSIS — M7552 Bursitis of left shoulder: Secondary | ICD-10-CM | POA: Diagnosis not present

## 2022-06-17 DIAGNOSIS — M19012 Primary osteoarthritis, left shoulder: Secondary | ICD-10-CM

## 2022-06-17 DIAGNOSIS — M19019 Primary osteoarthritis, unspecified shoulder: Secondary | ICD-10-CM | POA: Insufficient documentation

## 2022-06-17 NOTE — Assessment & Plan Note (Signed)
No chronic problem with worsening symptoms.  Given injection.  Tolerated the procedure well, discussed icing regimen and home exercises, discussed which activities to do and which ones to avoid.  Patient work with Product/process development scientist to learn these exercises in greater detail.  Follow-up with me again in 6 to 8 weeks

## 2022-06-17 NOTE — Patient Instructions (Signed)
Do prescribed exercises at least 3x a week Injection in shoulder See you again in 7-8 weeks

## 2022-06-17 NOTE — Assessment & Plan Note (Signed)
Patient given injection and improvement in range of motion almost immediately.  Discussed posture and ergonomics, discussed which activities would be helpful and which ones to avoid.  Increase activity slowly.  Due to patient's past medical history for breast cancer if significantly worsening symptoms will get advanced imaging but recent MRI of the breast was unremarkable.

## 2022-06-18 ENCOUNTER — Ambulatory Visit: Payer: Medicare Other

## 2022-06-19 NOTE — Telephone Encounter (Signed)
Forwarding to Rx Prior Auth Team 

## 2022-06-29 ENCOUNTER — Ambulatory Visit: Payer: Medicare Other | Admitting: Physician Assistant

## 2022-06-29 ENCOUNTER — Encounter: Payer: Self-pay | Admitting: Physician Assistant

## 2022-06-29 VITALS — BP 106/71 | HR 83

## 2022-06-29 DIAGNOSIS — C50211 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 female breast: Secondary | ICD-10-CM

## 2022-06-29 DIAGNOSIS — Z9011 Acquired absence of right breast and nipple: Secondary | ICD-10-CM

## 2022-06-29 NOTE — Progress Notes (Signed)
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   JUSTIFICATION FOR PROCEDURE:  Patricia Chandler is a 67 y.o. female with a history of breast cancer status post breast reconstruction. The patient presents for nipple areolar complex tattoo. Risks, benefits, indications, and alternatives of the above described procedures were discussed with the patient and all the patient's questions were answered.    03/09/2022 Patricia Chandler tells me that Patricia Chandler would like to be less "peachy" and have more of a pink color to the areolas.  Patricia Chandler tells me that Patricia Chandler would like her right nipple to be dark, even though her left nipple will likely be lighter in comparison given its hypopigmentation.  Engaged in [place] with patient we will not tattoo the left nipple, only the areola.  Patricia Chandler reports that Patricia Chandler did well with keeping the tattoo moist after last treatment.   05/04/2022 Today, Patricia Chandler tells me that Patricia Chandler is happier, but still feels peachy.  Agreeable to a more tan areolar pigment at this time.  Otherwise pleased with size, shape, and location on breasts.  No issues since last tattoo.  06/29/2022 Today, Patricia Chandler states that Patricia Chandler is pleased with the improved areolar colors.  Patricia Chandler wants to focus on the nipple as well as nipple and perinipple shading/lightening.  Patricia Chandler has seen some areolar restoration tattoos and inquired about introducing some lightening on and around the nipple.  Patricia Chandler is hoping to create additional 3D effect.  Patricia Chandler also feels as though the areolar edge could be softer as it blends with the rest of her skin.   DESCRIPTION OF PROCEDURE: After written informed consent was obtained and proper identification of  patient and surgical site was made, the patient was taken to the procedure room and placed supine on the operating room table. A time out was performed to confirm patient's identity and surgical site. The patient was prepped and draped in the usual sterile fashion. Alcohol swabs were used. Attention was turned to the selection of flesh colored permanent tattoo ink to produce the appropriate color for nipple areolar complex tattooing.  Using a Research scientist (medical) by [employer], 7R needle was placed and lightening was used on the nipple itself as well as circumferentially and attempts to create a halo effect around the nipple.  Additional lightening was performed inside the sheeting for the nipple to create additional depth and shooting.  The lightening was performed predominantly on the upper half of the nipple.  1P tattoo head was then used for additional shading around the nipple, predominantly on the inferior aspect.  Once adequate pigment had been applied to the nipple areolar complex, Xeroform dressing was applied followed by 4 x 4 gauze and secured with Medipore tape. Only performed today's tattooing on right side. There were no complications and the procedure was tolerated without difficulty.    Areola-color: Fair Honey (3), Fair Peach (1), Tan Peach (1), Cool Honey (1), and Portrait White (1) in 3:1:1:1:1 ratio. Nipple-color: Fair Honey (2), Fair Peach (1), Tan Peach (1), and Cool Honey (1) in 2:1:1:1 ratio. Additional:  Nipple shading: TP (2), CH (2), WM (1), FH (1), FP (1) in 2:2:1:1:1 ratio Nipple and halo lightening: PW (3), FH (1), FP (1) in 3:1:1 ratio  Colors EXP: 04/2024   Nipples are each approximately 4.5 cm diameter at conclusion of visit. Return in 8-12 weeks for 1.5 hour scheduled touch-up for [place] tubercles, nipple shading, and possible nipple coloring.

## 2022-06-30 ENCOUNTER — Encounter: Payer: Self-pay | Admitting: Family Medicine

## 2022-06-30 ENCOUNTER — Other Ambulatory Visit: Payer: Self-pay

## 2022-06-30 DIAGNOSIS — G8929 Other chronic pain: Secondary | ICD-10-CM

## 2022-07-06 ENCOUNTER — Encounter: Payer: Self-pay | Admitting: Family Medicine

## 2022-07-14 ENCOUNTER — Telehealth: Payer: Self-pay | Admitting: Nurse Practitioner

## 2022-07-14 NOTE — Telephone Encounter (Signed)
Inbound call from patient, requesting a call from a nurse. States she believes she is having a diverticulitis flare up.

## 2022-07-14 NOTE — Telephone Encounter (Signed)
PT is calling to speak to her nurse about symptoms she has been having

## 2022-07-15 ENCOUNTER — Other Ambulatory Visit: Payer: Self-pay

## 2022-07-15 DIAGNOSIS — K5792 Diverticulitis of intestine, part unspecified, without perforation or abscess without bleeding: Secondary | ICD-10-CM

## 2022-07-15 MED ORDER — AMOXICILLIN-POT CLAVULANATE 875-125 MG PO TABS: 1.0000 | ORAL_TABLET | Freq: Two times a day (BID) | ORAL | 0 refills | Status: AC

## 2022-07-15 MED ORDER — AMOXICILLIN-POT CLAVULANATE 875-125 MG PO TABS: 1.0000 | ORAL_TABLET | Freq: Two times a day (BID) | ORAL | 0 refills | Status: DC

## 2022-07-15 NOTE — Telephone Encounter (Signed)
Patient notified. She will remain on clear liquids in anticipation of the CT scan. Pharmacy confirmed and prescription transmitted.  Pre-certification team notified.

## 2022-07-15 NOTE — Telephone Encounter (Signed)
Patient is calling back to follow up on note below she said she really need a nurse to call her asap.

## 2022-07-15 NOTE — Telephone Encounter (Signed)
DOD Patient of Dr Havery Moros last seen by Tye Savoy, NP  01/06/2019.  Spoke with the patient. Complains of generalized lower abdominal tenderness which worsens after eating for "about 4 days." Patient states "I had diverticulitis with an abscess before and I worry that I am developing diverticulitis again. I don't have a fever. I have a slight headache like last time." When the abdominal pain began she "immediately went to a soft diet." She endorses increased intestinal gas and some indigestion. She took ibuprofen 4 times in one day and then switched to Tylenol. Reports daily soft bowel movements. No openings on schedules for several weeks. Please advise. Thanks

## 2022-07-16 ENCOUNTER — Ambulatory Visit (HOSPITAL_COMMUNITY)
Admission: RE | Admit: 2022-07-16 | Discharge: 2022-07-16 | Disposition: A | Discharge status: Home / Self Care | Payer: Medicare Other | Source: Ambulatory Visit | Attending: Internal Medicine | Admitting: Internal Medicine

## 2022-07-16 DIAGNOSIS — K5730 Diverticulosis of large intestine without perforation or abscess without bleeding: Secondary | ICD-10-CM | POA: Diagnosis not present

## 2022-07-16 DIAGNOSIS — K5792 Diverticulitis of intestine, part unspecified, without perforation or abscess without bleeding: Secondary | ICD-10-CM

## 2022-07-16 MED ORDER — IOHEXOL 9 MG/ML PO SOLN
500.0000 mL | ORAL | Status: AC
  Administered 2022-07-16 (×2): 500 mL via ORAL

## 2022-07-16 MED ORDER — IOHEXOL 9 MG/ML PO SOLN
ORAL | Status: AC
  Filled 2022-07-16: qty 1000

## 2022-07-16 MED ORDER — IOHEXOL 300 MG/ML  SOLN
100.0000 mL | Freq: Once | INTRAMUSCULAR | Status: AC | PRN
  Administered 2022-07-16: 100 mL via INTRAVENOUS

## 2022-07-16 NOTE — Telephone Encounter (Signed)
Spoke with the patient. She is aware the CT did not show acute diverticulitis. She reports she feels better today without tenderness. Her remaining symptoms is fatigue which she attributes to her anxiety and liquid diet. She is returning to a soft diet and will progress as tolerated to her regular diet. She has had 2 doses of the Augmentin. She will d/c this unless otherwise recommended.

## 2022-07-20 ENCOUNTER — Other Ambulatory Visit: Payer: Medicare Other

## 2022-07-21 DIAGNOSIS — H40053 Ocular hypertension, bilateral: Secondary | ICD-10-CM | POA: Diagnosis not present

## 2022-07-27 ENCOUNTER — Ambulatory Visit (INDEPENDENT_AMBULATORY_CARE_PROVIDER_SITE_OTHER): Payer: Medicare Other | Admitting: Physician Assistant

## 2022-07-27 ENCOUNTER — Telehealth: Payer: Self-pay | Admitting: Adult Health

## 2022-07-27 DIAGNOSIS — Z170 Estrogen receptor positive status [ER+]: Secondary | ICD-10-CM

## 2022-07-27 DIAGNOSIS — C50211 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 female breast: Secondary | ICD-10-CM

## 2022-07-27 DIAGNOSIS — Z9011 Acquired absence of right breast and nipple: Secondary | ICD-10-CM

## 2022-07-27 NOTE — Progress Notes (Signed)
Patient is a pleasant 67 year old female with PMH of acquired absence of right nipple areola as well as hyperpigmentation of left NAC who has been seeing our office for nipple areolar restoration tattooing who joins via telephone for follow-up.  The patient was at home and this provider was calling from their office.  A total of 10 minutes was spent speaking with the patient and reviewing chart.    The purpose of today's telephone encounter was to determine if she wanted another tattoo to meet for additional detailing or if she was pleased with current tattoos.  Today, she tells me that a lot of the lightening that occurred at most recent procedure visit has faded and does not demonstrate the contrast that she was hoping for.  She reports that overall she is pleased that she now has an areola on the right side, but still thinks that there could be improvement on the line.  She is heading into her busy season and would like to let her tattoos settle for the next 6 to 12 months and then schedule another tattoo visit with this provider.  Discussed likely new color selections available at that time.  Hopefully can transition her to a slightly pinker hue.  She understands that this provider's schedule is not yet open that far in advance, but she will call the office to schedule a 2 to 2.5-hour visit at that time.

## 2022-07-27 NOTE — Telephone Encounter (Signed)
Rescheduled appointment per provider template. Left voicemail. 

## 2022-08-04 NOTE — Progress Notes (Deleted)
[facility] [address] [place] [address] Phone: [phone] Subjective:    I'm seeing this patient by the request  of:  [name], MD  CC:   [id number]  06/17/2022 Patient given injection and improvement in range of motion almost immediately.  Discussed posture and ergonomics, discussed which activities would be helpful and which ones to avoid.  Increase activity slowly.  Due to patient's past medical history for breast cancer if significantly worsening symptoms will get advanced imaging but recent MRI of the breast was unremarkable.     No chronic problem with worsening symptoms. Given injection. Tolerated the procedure well, discussed icing regimen and home exercises, discussed which activities to do and which ones to avoid. Patient work with Event organiser to learn these exercises in greater detail. Follow-up with me again in 6 to 8 weeks   Update 08/05/2022 Patricia Chandler is a 67 y.o. female coming in with complaint of L shoulder pain. MRI scheduled 08/18/2022. Patient states        Past Medical History:  Diagnosis Date   Anemia    due to chemo   Anxiety    Breast cancer (HCC) dx'd 2017   right   Cardiomyopathy (HCC)    mild, per cardiology note; due to Herceptin   Dental crowns present    Family history of adverse reaction to anesthesia    pt's daughter has hx. of post-op nausea   GERD (gastroesophageal reflux disease)    Heart murmur    History of breast cancer 2017   right   History of chemotherapy    finished chemo 04/15/2016   Personal history of chemotherapy    Personal history of radiation therapy    Right Breast Cancer   Rash 08/04/2016   under right arm - states recent tick bite; to see MD 08/05/2016   Recurrent breast cancer, right (HCC) dx'd 10/2018   Runny nose 06/06/2016   clear drainage, per pt.   Past Surgical History:  Procedure Laterality Date   AUGMENTATION MAMMAPLASTY Left     BREAST BIOPSY Right 11/30/2018   Procedure: RIGHT BREAST CHEST WALL RECURRENCE EXCISION;  Surgeon: Emelia Loron, MD;  Location: [facility] OR;  Service: General;  Laterality: Right;   BREAST ENHANCEMENT SURGERY Bilateral 2005   breast mass removal Right 11/30/2018   BREAST RECONSTRUCTION WITH PLACEMENT OF TISSUE EXPANDER AND FLEX HD (ACELLULAR HYDRATED DERMIS) Right 05/19/2016   Procedure: RIGHT BREAST RECONSTRUCTION WITH PLACEMENT OF TISSUE EXPANDER AND  ALLODERM.;  Surgeon: Glenna Fellows, MD;  Location: [facility];  Service: Plastics;  Laterality: Right;   COLONOSCOPY     COLONOSCOPY WITH PROPOFOL  10/02/2014   DEBRIDEMENT AND CLOSURE WOUND Right 06/08/2016   Procedure: DEBRIDEMENT RIGHT MASTECTOMY FLAP, TISSUE EXPANSION OF RIGHT CHEST;  Surgeon: Glenna Fellows, MD;  Location: [facility] OR;  Service: Plastics;  Laterality: Right;   MASTECTOMY Right    MASTOPEXY Left 08/11/2016   Procedure: LEFT BREAST MASTOPEXY;  Surgeon: Glenna Fellows, MD;  Location: [facility];  Service: Plastics;  Laterality: Left;   NIPPLE SPARING MASTECTOMY/SENTINAL LYMPH NODE BIOPSY/RECONSTRUCTION/PLACEMENT OF TISSUE EXPANDER Right 05/19/2016   Procedure: RIGHT NIPPLE SPARING MASTECTOMY WITH SENTINAL LYMPH NODE BIOPSY WITH BLUE DYE INJECTION;  Surgeon: Emelia Loron, MD;  Location: [facility];  Service: General;  Laterality: Right;   PORT-A-CATH REMOVAL Right 04/26/2019   Procedure: REMOVAL PORT-A-CATH;  Surgeon: Emelia Loron, MD;  Location: [facility];  Service: General;  Laterality: Right;   PORTACATH PLACEMENT N/A 12/30/2015   Procedure: INSERTION PORT-A-CATH WITH [place];  Surgeon: Emelia Loron, MD;  Location: [facility];  Service: General;  Laterality: N/A;   PORTACATH PLACEMENT N/A 11/30/2018   Procedure: INSERTION PORT-A-CATH WITH ULTRASOUND;  Surgeon: Emelia Loron, MD;  Location: [facility] OR;  Service: General;  Laterality: N/A;    REDUCTION MAMMAPLASTY     REMOVAL OF TISSUE EXPANDER AND PLACEMENT OF IMPLANT Right 08/11/2016   Procedure: REMOVAL OF RIGHT  TISSUE EXPANDER AND PLACEMENT OF IMPLANT;  Surgeon: Glenna Fellows, MD;  Location: [facility];  Service: Plastics;  Laterality: Right;   Social History   Socioeconomic History   Marital status: Married    Spouse name: Not on file   Number of children: 2   Years of education: Not on file   Highest education level: Not on file  Occupational History   Occupation: retired  Tobacco Use   Smoking status: Never   Smokeless tobacco: Never  Vaping Use   Vaping Use: Never used  Substance and Sexual Activity   Alcohol use: Yes    Comment: occasionally   Drug use: No   Sexual activity: Not on file  Other Topics Concern   Not on file  Social History Narrative   Married 1978 (Al). 2 girls 33 and 36 in 2018. Lanora Manis (liz) lives in [place], [place] with 11 yo son (single right now). and [name] in [place], [place]--> now [place] with husband and 29 year old daughter (born with heart defect with missing valve and initially not closed VSD--still weighs heavily on family- likely will have to be repaired).       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    Graduated from [employer]. [employer] prior.       Hobbies: gardening, decorating, sewing, enjoys creating   Social Determinants of Health   Financial Resource Strain: Low Risk  (08/05/2021)   Overall Financial Resource Strain (CARDIA)    Difficulty of Paying Living Expenses: Not hard at all  Food Insecurity: No Food Insecurity (11/28/2021)   Hunger Vital Sign    Worried About Running Out of Food in the Last Year: Never true    Ran Out of Food in the Last Year: Never true  Transportation Needs: No Transportation Needs (11/28/2021)   PRAPARE - Administrator, Civil Service (Medical): No    Lack of Transportation (Non-Medical):  No  Physical Activity: Sufficiently Active (08/05/2021)   Exercise Vital Sign    Days of Exercise per Week: 5 days    Minutes of Exercise per Session: 60 min  Stress: No Stress Concern Present (08/05/2021)   [employer] of Occupational Health - Occupational Stress Questionnaire    Feeling of Stress : Not at all  Social Connections: Moderately Isolated (08/05/2021)   Social Connection and Isolation Panel [NHANES]    Frequency of Communication with Friends and Family: More than three times a week    Frequency of Social Gatherings with Friends and Family: More than three times a week    Attends Religious Services: Never    Database administrator or Organizations: No    Attends Banker Meetings: Never    Marital Status: Married   No Known Allergies Family History  Problem Relation Age of Onset   Breast cancer Maternal Aunt        dx in her 70s  Stroke Father        late 52s   Hypertension Father    Pulmonary fibrosis Mother    Breast cancer Paternal Aunt 63   Cancer Paternal Grandmother        abdominal; cervical vs uterine dx in her 6s   Stomach cancer Maternal Grandmother    Parkinson's disease Maternal Grandfather    Anesthesia problems Daughter        post-op nausea   Colon cancer Neg Hx    Esophageal cancer Neg Hx    Rectal cancer Neg Hx     Current Outpatient Medications (Endocrine & Metabolic):    denosumab (PROLIA) 60 MG/ML SOLN injection, Inject 60 mg into the skin every 6 (six) months. Administer in upper arm, thigh, or abdomen      Current Outpatient Medications (Other):    anastrozole (ARIMIDEX) 1 MG tablet, TAKE 1 TABLET BY MOUTH DAILY   Biotin 1 MG CAPS, Take by mouth.   calcium-vitamin D (OSCAL WITH D) 500-200 MG-UNIT tablet, Take 1 tablet by mouth daily with breakfast.   Lactobacillus (PROBIOTIC ACIDOPHILUS PO), Take 1 capsule by mouth daily.    omeprazole (PRILOSEC) 20 MG capsule, Take 20 mg by mouth daily as needed (heartburn).    Turmeric POWD, Take by mouth.   zolpidem (AMBIEN) 5 MG tablet, TAKE 1 TABLET BY MOUTH EACH NIGHT AT BEDTIME   Reviewed prior external information including notes and imaging from  primary care provider As well as notes that were available from care everywhere and other healthcare systems.  Past medical history, social, surgical and family history all reviewed in electronic medical record.  No pertanent information unless stated regarding to the chief complaint.   Review of Systems:  No headache, visual changes, nausea, vomiting, diarrhea, constipation, dizziness, abdominal pain, skin rash, fevers, chills, night sweats, weight loss, swollen lymph nodes, body aches, joint swelling, chest pain, shortness of breath, mood changes. POSITIVE muscle aches  Objective  There were no vitals taken for this visit.   General: No apparent distress alert and oriented x3 mood and affect normal, dressed appropriately.  HEENT: Pupils equal, extraocular movements intact  Respiratory: Patient's speak in full sentences and does not appear short of breath  Cardiovascular: No lower extremity edema, non tender, no erythema      Impression and Recommendations:

## 2022-08-05 ENCOUNTER — Ambulatory Visit: Payer: Medicare Other | Admitting: Family Medicine

## 2022-08-18 ENCOUNTER — Other Ambulatory Visit: Payer: Medicare Other

## 2022-08-19 DIAGNOSIS — H40053 Ocular hypertension, bilateral: Secondary | ICD-10-CM | POA: Diagnosis not present

## 2022-08-24 ENCOUNTER — Other Ambulatory Visit: Payer: Medicare Other | Admitting: Physician Assistant

## 2022-09-01 ENCOUNTER — Telehealth: Payer: Self-pay | Admitting: Hematology and Oncology

## 2022-09-01 ENCOUNTER — Telehealth: Payer: Self-pay | Admitting: Adult Health

## 2022-09-10 ENCOUNTER — Inpatient Hospital Stay: Payer: Medicare Other | Admitting: Adult Health

## 2022-09-10 ENCOUNTER — Other Ambulatory Visit: Payer: Medicare Other

## 2022-09-10 ENCOUNTER — Inpatient Hospital Stay: Payer: Medicare Other

## 2022-09-10 ENCOUNTER — Ambulatory Visit: Payer: Medicare Other | Admitting: Adult Health

## 2022-09-21 ENCOUNTER — Other Ambulatory Visit: Payer: Self-pay | Admitting: Hematology and Oncology

## 2022-09-25 ENCOUNTER — Encounter: Payer: Self-pay | Admitting: Family Medicine

## 2022-09-25 ENCOUNTER — Ambulatory Visit (INDEPENDENT_AMBULATORY_CARE_PROVIDER_SITE_OTHER): Payer: Medicare Other | Admitting: Family Medicine

## 2022-09-25 VITALS — BP 122/80 | HR 78 | Temp 97.6°F | Ht 63.0 in | Wt 150.6 lb

## 2022-09-25 DIAGNOSIS — Z79899 Other long term (current) drug therapy: Secondary | ICD-10-CM | POA: Diagnosis not present

## 2022-09-25 DIAGNOSIS — I700 Atherosclerosis of aorta: Secondary | ICD-10-CM | POA: Diagnosis not present

## 2022-09-25 DIAGNOSIS — Z0000 Encounter for general adult medical examination without abnormal findings: Secondary | ICD-10-CM

## 2022-09-25 DIAGNOSIS — E785 Hyperlipidemia, unspecified: Secondary | ICD-10-CM | POA: Diagnosis not present

## 2022-09-25 MED ORDER — BELSOMRA 5 MG PO TABS: 5.0000 mg | ORAL_TABLET | Freq: Every evening | ORAL | 5 refills | Status: DC | PRN

## 2022-09-25 NOTE — Patient Instructions (Addendum)
Let us know if you get any other COVID vaccines (would want until the fall)  or your TDap at the pharmacy.  See if they can do lipid panel  (printed for her- please give this)  and B12 at cancer center  Can trial belsomra instead of Ambien   Prolia will be due in late July/early August- see if they can scheulde you  Could trial famotidine/Pepcid before meals instead of omeprazole   Recommended follow up: Return in about 1 year (around 09/25/2023) for physical or sooner if needed.Schedule b4 you leave.

## 2022-09-25 NOTE — Progress Notes (Signed)
Phone [phone]   Subjective:  Patient presents today for their annual physical. Chief complaint-noted.   See problem oriented charting- ROS- full  review of systems was completed and negative Per full ROS sheet completed by patient  The following were reviewed and entered/updated in epic: Past Medical History:  Diagnosis Date   Anemia    due to chemo   Anxiety    Breast cancer (HCC) dx'd 2017   right   Cardiomyopathy (HCC)    mild, per cardiology note; due to Herceptin   Dental crowns present    Family history of adverse reaction to anesthesia    pt's daughter has hx. of post-op nausea   GERD (gastroesophageal reflux disease)    Heart murmur    History of breast cancer 2017   right   History of chemotherapy    finished chemo 04/15/2016   Personal history of chemotherapy    Personal history of radiation therapy    Right Breast Cancer   Rash 08/04/2016   under right arm - states recent tick bite; to see MD 08/05/2016   Recurrent breast cancer, right (HCC) dx'd 10/2018   Runny nose 06/06/2016   clear drainage, per pt.   Patient Active Problem List   Diagnosis Date Noted   Port catheter in place 01/08/2016    Priority: High   Breast cancer of upper-inner quadrant of right female breast (HCC) 12/18/2015    Priority: High   Aortic atherosclerosis (HCC) 09/25/2022    Priority: Medium    Osteoporosis 05/07/2020    Priority: Medium    Acute diverticulitis 06/14/2018    Priority: Medium    Hypokalemia 03/30/2016    Priority: Medium    Chemotherapy-induced thrombocytopenia 03/30/2016    Priority: Medium    GERD (gastroesophageal reflux disease) 01/08/2016    Priority: Medium    Acute bursitis of right shoulder 06/08/2017    Priority: Low   Acute shoulder bursitis, left 03/08/2017    Priority: Low   Genetic testing 01/10/2016    Priority: Low   History of sepsis 01/08/2016    Priority: Low   Family history of breast cancer     Priority: Low   Internal  hemorrhoid 01/26/2011    Priority: Low   AC (acromioclavicular) arthritis 06/17/2022    Priority: 1.   DDD (degenerative disc disease), cervical 10/08/2016    Priority: 1.   Encounter for counseling 12/02/2021   Acquired absence of nipple 09/16/2021   Snoring 01/23/2020   Insomnia 01/08/2020   Liver lesion 06/21/2018   Intra-abdominal abscess (HCC) 06/14/2018   Past Surgical History:  Procedure Laterality Date   AUGMENTATION MAMMAPLASTY Left    BREAST BIOPSY Right 11/30/2018   Procedure: RIGHT BREAST CHEST WALL RECURRENCE EXCISION;  Surgeon: Emelia Loron, MD;  Location: [facility] OR;  Service: General;  Laterality: Right;   BREAST ENHANCEMENT SURGERY Bilateral 2005   breast mass removal Right 11/30/2018   BREAST RECONSTRUCTION WITH PLACEMENT OF TISSUE EXPANDER AND FLEX HD (ACELLULAR HYDRATED DERMIS) Right 05/19/2016   Procedure: RIGHT BREAST RECONSTRUCTION WITH PLACEMENT OF TISSUE EXPANDER AND  ALLODERM.;  Surgeon: Glenna Fellows, MD;  Location:  SURGERY CENTER;  Service: Plastics;  Laterality: Right;   COLONOSCOPY     COLONOSCOPY WITH PROPOFOL  10/02/2014   DEBRIDEMENT AND CLOSURE WOUND Right 06/08/2016   Procedure: DEBRIDEMENT RIGHT MASTECTOMY FLAP, TISSUE EXPANSION OF RIGHT CHEST;  Surgeon: Glenna Fellows, MD;  Location: [facility] OR;  Service: Plastics;  Laterality: Right;   MASTECTOMY Right  MASTOPEXY Left 08/11/2016   Procedure: LEFT BREAST MASTOPEXY;  Surgeon: Glenna Fellows, MD;  Location: [facility];  Service: Plastics;  Laterality: Left;   NIPPLE SPARING MASTECTOMY/SENTINAL LYMPH NODE BIOPSY/RECONSTRUCTION/PLACEMENT OF TISSUE EXPANDER Right 05/19/2016   Procedure: RIGHT NIPPLE SPARING MASTECTOMY WITH SENTINAL LYMPH NODE BIOPSY WITH BLUE DYE INJECTION;  Surgeon: Emelia Loron, MD;  Location: [facility];  Service: General;  Laterality: Right;   PORT-A-CATH REMOVAL Right 04/26/2019   Procedure: REMOVAL PORT-A-CATH;  Surgeon: Emelia Loron, MD;  Location: [facility];  Service: General;  Laterality: Right;   PORTACATH PLACEMENT N/A 12/30/2015   Procedure: INSERTION PORT-A-CATH WITH [place];  Surgeon: Emelia Loron, MD;  Location: [facility];  Service: General;  Laterality: N/A;   PORTACATH PLACEMENT N/A 11/30/2018   Procedure: INSERTION PORT-A-CATH WITH ULTRASOUND;  Surgeon: Emelia Loron, MD;  Location: [facility] OR;  Service: General;  Laterality: N/A;   REDUCTION MAMMAPLASTY     REMOVAL OF TISSUE EXPANDER AND PLACEMENT OF IMPLANT Right 08/11/2016   Procedure: REMOVAL OF RIGHT  TISSUE EXPANDER AND PLACEMENT OF IMPLANT;  Surgeon: Glenna Fellows, MD;  Location:  SURGERY CENTER;  Service: Plastics;  Laterality: Right;    Family History  Problem Relation Age of Onset   Breast cancer Maternal Aunt        dx in her 70s   Cancer Maternal Aunt    Stroke Father        late 31s   Hypertension Father    Pulmonary fibrosis Mother    Vision loss Mother    Breast cancer Paternal Aunt 54   Cancer Paternal Grandmother        abdominal; cervical vs uterine dx in her 25s   Stomach cancer Maternal Grandmother    Parkinson's disease Maternal Grandfather    Anesthesia problems Daughter        post-op nausea   Colon cancer Neg Hx    Esophageal cancer Neg Hx    Rectal cancer Neg Hx     Medications- reviewed and updated Current Outpatient Medications  Medication Sig Dispense Refill   anastrozole (ARIMIDEX) 1 MG tablet TAKE 1 TABLET BY MOUTH DAILY 90 tablet 3   Biotin 1 MG CAPS Take by mouth.     calcium-vitamin D (OSCAL WITH D) 500-200 MG-UNIT tablet Take 1 tablet by mouth daily with breakfast.     denosumab (PROLIA) 60 MG/ML SOLN injection Inject 60 mg into the skin every 6 (six) months. Administer in upper arm, thigh, or abdomen     Lactobacillus (PROBIOTIC ACIDOPHILUS PO) Take 1 capsule by mouth daily.      omeprazole (PRILOSEC) 20 MG capsule Take 20 mg by mouth daily as needed  (heartburn).     Suvorexant (BELSOMRA) 5 MG TABS Take 1 tablet (5 mg total) by mouth at bedtime as needed. Do not take with Ambien- would be substitute 30 tablet 5   Turmeric POWD Take by mouth.     zolpidem (AMBIEN) 5 MG tablet TAKE 1 TABLET BY MOUTH EACH NIGHT AT BEDTIME (Patient not taking: Reported on 09/25/2022) 30 tablet 3   No current facility-administered medications for this visit.    Allergies-reviewed and updated No Known Allergies  Social History   Social History Narrative   Married 1978 (Al). 2 girls 33 and 36 in 2018. Lanora Manis (liz) lives in [place], [place] with 56 yo son (single right now). and [name] in [place], [place]--> now [place] with husband and 46 year old daughter (  born with heart defect with missing valve and initially not closed VSD--still weighs heavily on family- likely will have to be repaired).       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    Graduated from [employer]. [employer] prior.       Hobbies: gardening, decorating, sewing, enjoys creating   Objective  Objective:  BP 122/80   Pulse 78   Temp 97.6 F (36.4 C)   Ht 5\' 3"  (1.6 m)   Wt 150 lb 9.6 oz (68.3 kg)   SpO2 96%   BMI 26.68 kg/m  Gen: NAD, resting comfortably HEENT: Mucous membranes are moist. Oropharynx normal Neck: no thyromegaly CV: RRR no murmurs rubs or gallops Lungs: CTAB no crackles, wheeze, rhonchi Abdomen: soft/nontender/nondistended/normal bowel sounds. No rebound or guarding.  Ext: no edema Skin: warm, dry Neuro: grossly normal, moves all extremities, PERRLA   Assessment and Plan   67 y.o. female presenting for annual physical.  Health Maintenance counseling: 1. Anticipatory guidance: Patient counseled regarding regular dental exams -q6 months, eye exams -yearly,  avoiding smoking and second hand smoke , limiting alcohol to 1 beverage per day- sparing glass of wine 5pm , no illicit drugs .   2. Risk  factor reduction:  Advised patient of need for regular exercise and diet rich and fruits and vegetables to reduce risk of heart attack and stroke.  Exercise- doing great with this- gardening, walking, exercise machine, weights- 5 days a week.  Diet/weight management-weight down 1 pounds from last year - continues to eat rather clean/healthy Wt Readings from Last 3 Encounters:  09/25/22 150 lb 9.6 oz (68.3 kg)  06/17/22 154 lb (69.9 kg)  03/02/22 153 lb (69.4 kg)  3. Immunizations/screenings/ancillary studies- COVID - had one in 2023, planned Tetanus, Diphtheria, and Pertussis (Tdap) at pharmacy last year - still needs this Immunization History  Administered Date(s) Administered   Fluad Quad(high Dose 65+) 01/22/2020, 12/29/2021   Influenza, High Dose Seasonal PF 12/10/2018   Influenza,inj,Quad PF,6+ Mos 02/18/2016, 02/01/2017, 02/28/2018   Influenza-Unspecified 01/19/2016, 02/19/2020, 02/26/2021   PFIZER(Purple Top)SARS-COV-2 Vaccination 05/12/2019, 06/02/2019, 12/20/2019, 11/13/2020, 02/26/2021   PNEUMOCOCCAL CONJUGATE-20 12/10/2020   Pfizer Covid-19 Vaccine Bivalent Booster 23yrs & up 02/26/2021   Pneumococcal Conjugate-13 11/02/2018   Pneumococcal-Unspecified 12/10/2020   Tdap 07/20/2011   Zoster Recombinat (Shingrix) 06/09/2017, 08/12/2017  4. Cervical cancer screening- still sees GYN but released from paps after last one 5. Breast cancer screening-follows closely with Dr. Pamelia Hoit with recurrence history .  Remains on Arimidex.  MR breast 06/08/22 and mammogram 12/12/21-  alternates every 6 months 6. Colon cancer screening - May 2020 with 7-year repeat due to polyp history 7. Skin cancer screening-sees dermatology at least every few years.  advised regular sunscreen use. Denies worrisome, changing, or new skin lesions.  8. Birth control/STD check- postmenopausal and monogamous 9. Osteoporosis screening at 46- prior on Prolia for 4 years.  Oncology recommended potentially a 1 year break  between doses for potential remodeling of bone did not suggest Reclast injection (she does not tolerate Fosamax).  Last dose 06/17/2021 then again January 2024 after bone density updated- 6 month repeat 10. Smoking associated screening - Never smoker  Status of chronic or acute concerns   #insomnia- delirium with trazodone. Does well with Ambien 5 mg - can use half tablet sometimes- wants to trial belsomra with potential dementia risks with ambien  #history diverticulitis- thankfully no recent recurrence  #  osteoporosis - see above  #hyperlipidemia #aortic atherosclerosis  S: Medication:none  Lab Results  Component Value Date   CHOL 217 (H) 12/18/2020   HDL 104 12/18/2020   LDLCALC 103 (H) 12/18/2020   TRIG 51 12/18/2020   CHOLHDL 2.1 12/18/2020   A/P: mildly high LDL but HDL over 100 stops arteriosclerotic cardiovascular disease calculation- plans to get updated levels with cancer center and we can see if we can calculate - ordered today but they may have to order separately - already doing pretty heavily plant based diet but feels could make some further improvement- if goes more vegan then would need dietary supplement- she studies  Aortic atherosclerosis (presumed stable)- LDL goal ideally <70- she wants to work on lifestyle to get this down- may move even more plant based  #GERD- as needed omeprazole 20 mg - 5 days a week - does take B12 supplement  #Headache with ear fullness/pressure as well- see visit 03/02/22- largely reassuring MRI. -thankfully headache(s) resolved- thinks in reflection likely stress related  Recommended follow up: Return in about 1 year (around 09/25/2023) for physical or sooner if needed.Schedule b4 you leave. Future Appointments  Date Time Provider Department Center  10/06/2022  4:00 PM [facility] ANNUAL WELLNESS VISIT 1 [facility]  10/08/2022  9:45 AM [facility] LAB [facility] None  10/08/2022 10:15 AM Causey, Larna Daughters, NP [facility] None   11/19/2022 10:00 AM [facility] NURSE [facility]   Lab/Order associations: NO LABS today   ICD-10-CM   1. Routine general medical examination at a health care facility  Z00.00     2. Hyperlipidemia, unspecified hyperlipidemia type  E78.5 Lipid panel    3. Aortic atherosclerosis (HCC)  I70.0     4. High risk medication use  Z79.899 Vitamin B12      Meds ordered this encounter  Medications   Suvorexant (BELSOMRA) 5 MG TABS    Sig: Take 1 tablet (5 mg total) by mouth at bedtime as needed. Do not take with Ambien- would be substitute    Dispense:  30 tablet    Refill:  5    Return precautions advised.  Tana Conch, MD

## 2022-10-05 ENCOUNTER — Encounter: Payer: Self-pay | Admitting: Adult Health

## 2022-10-06 ENCOUNTER — Ambulatory Visit (INDEPENDENT_AMBULATORY_CARE_PROVIDER_SITE_OTHER): Payer: Medicare Other

## 2022-10-06 VITALS — Wt 150.0 lb

## 2022-10-06 DIAGNOSIS — Z0000 Encounter for general adult medical examination without abnormal findings: Secondary | ICD-10-CM

## 2022-10-06 NOTE — Patient Instructions (Signed)
Patricia Chandler , Thank you for taking time to come for your Medicare Wellness Visit. I appreciate your ongoing commitment to your health goals. Please review the following plan we discussed and let me know if I can assist you in the future.   These are the goals we discussed:  Goals       No needs (pt-stated)      Care Coordination Interventions: Reviewed medications with patient and discussed Purpose of all medications Reviewed scheduled/upcoming provider appointments including [facility] 09/25/2022 verified by pt Screening for signs and symptoms of depression related to chronic disease state  Assessed social determinant of health barriers       Patient Stated      Increase exercise and continue to eat better       Patient Stated      Improving overall health         This is a list of the screening recommended for you and due dates:  Health Maintenance  Topic Date Due   COVID-19 Vaccine (6 - 2023-24 season) 10/11/2022*   Flu Shot  11/19/2022   Medicare Annual Wellness Visit  10/06/2023   Mammogram  06/08/2024   Colon Cancer Screening  09/08/2025   Pneumonia Vaccine  Completed   DEXA scan (bone density measurement)  Completed   Hepatitis C Screening  Completed   Zoster (Shingles) Vaccine  Completed   HPV Vaccine  Aged Out   DTaP/Tdap/Td vaccine  Discontinued  *Topic was postponed. The date shown is not the original due date.    Advanced directives: Please bring a copy of your health care power of attorney and living will to the office at your convenience.   Conditions/risks identified: improving overall health   Next appointment: Follow up in one year for your annual wellness visit    Preventive Care 65 Years and Older, Female Preventive care refers to lifestyle choices and visits with your health care provider that can promote health and wellness. What does preventive care include? A yearly physical exam. This is also called an annual well check. Dental exams once or twice a  year. Routine eye exams. Ask your health care provider how often you should have your eyes checked. Personal lifestyle choices, including: Daily care of your teeth and gums. Regular physical activity. Eating a healthy diet. Avoiding tobacco and drug use. Limiting alcohol use. Practicing safe sex. Taking low-dose aspirin every day. Taking vitamin and mineral supplements as recommended by your health care provider. What happens during an annual well check? The services and screenings done by your health care provider during your annual well check will depend on your age, overall health, lifestyle risk factors, and family history of disease. Counseling  Your health care provider may ask you questions about your: Alcohol use. Tobacco use. Drug use. Emotional well-being. Home and relationship well-being. Sexual activity. Eating habits. History of falls. Memory and ability to understand (cognition). Work and work Astronomer. Reproductive health. Screening  You may have the following tests or measurements: Height, weight, and BMI. Blood pressure. Lipid and cholesterol levels. These may be checked every 5 years, or more frequently if you are over 22 years old. Skin check. Lung cancer screening. You may have this screening every year starting at age 53 if you have a 30-pack-year history of smoking and currently smoke or have quit within the past 15 years. Fecal occult blood test (FOBT) of the stool. You may have this test every year starting at age 70. Flexible sigmoidoscopy or colonoscopy.  You may have a sigmoidoscopy every 5 years or a colonoscopy every 10 years starting at age 34. Hepatitis C blood test. Hepatitis B blood test. Sexually transmitted disease (STD) testing. Diabetes screening. This is done by checking your blood sugar (glucose) after you have not eaten for a while (fasting). You may have this done every 1-3 years. Bone density scan. This is done to screen for  osteoporosis. You may have this done starting at age 44. Mammogram. This may be done every 1-2 years. Talk to your health care provider about how often you should have regular mammograms. Talk with your health care provider about your test results, treatment options, and if necessary, the need for more tests. Vaccines  Your health care provider may recommend certain vaccines, such as: Influenza vaccine. This is recommended every year. Tetanus, diphtheria, and acellular pertussis (Tdap, Td) vaccine. You may need a Td booster every 10 years. Zoster vaccine. You may need this after age 55. Pneumococcal 13-valent conjugate (PCV13) vaccine. One dose is recommended after age 57. Pneumococcal polysaccharide (PPSV23) vaccine. One dose is recommended after age 39. Talk to your health care provider about which screenings and vaccines you need and how often you need them. This information is not intended to replace advice given to you by your health care provider. Make sure you discuss any questions you have with your health care provider. Document Released: 05/03/2015 Document Revised: 12/25/2015 Document Reviewed: 02/05/2015 Elsevier Interactive Patient Education  2017 [employer].  Fall Prevention in the Home Falls can cause injuries. They can happen to people of all ages. There are many things you can do to make your home safe and to help prevent falls. What can I do on the outside of my home? Regularly fix the edges of walkways and driveways and fix any cracks. Remove anything that might make you trip as you walk through a door, such as a raised step or threshold. Trim any bushes or trees on the path to your home. Use bright outdoor lighting. Clear any walking paths of anything that might make someone trip, such as rocks or tools. Regularly check to see if handrails are loose or broken. Make sure that both sides of any steps have handrails. Any raised decks and porches should have guardrails on  the edges. Have any leaves, snow, or ice cleared regularly. Use sand or salt on walking paths during winter. Clean up any spills in your garage right away. This includes oil or grease spills. What can I do in the bathroom? Use night lights. Install grab bars by the toilet and in the tub and shower. Do not use towel bars as grab bars. Use non-skid mats or decals in the tub or shower. If you need to sit down in the shower, use a plastic, non-slip stool. Keep the floor dry. Clean up any water that spills on the floor as soon as it happens. Remove soap buildup in the tub or shower regularly. Attach bath mats securely with double-sided non-slip rug tape. Do not have throw rugs and other things on the floor that can make you trip. What can I do in the bedroom? Use night lights. Make sure that you have a light by your bed that is easy to reach. Do not use any sheets or blankets that are too big for your bed. They should not hang down onto the floor. Have a firm chair that has side arms. You can use this for support while you get dressed. Do not have  throw rugs and other things on the floor that can make you trip. What can I do in the kitchen? Clean up any spills right away. Avoid walking on wet floors. Keep items that you use a lot in easy-to-reach places. If you need to reach something above you, use a strong step stool that has a grab bar. Keep electrical cords out of the way. Do not use floor polish or wax that makes floors slippery. If you must use wax, use non-skid floor wax. Do not have throw rugs and other things on the floor that can make you trip. What can I do with my stairs? Do not leave any items on the stairs. Make sure that there are handrails on both sides of the stairs and use them. Fix handrails that are broken or loose. Make sure that handrails are as long as the stairways. Check any carpeting to make sure that it is firmly attached to the stairs. Fix any carpet that is loose  or worn. Avoid having throw rugs at the top or bottom of the stairs. If you do have throw rugs, attach them to the floor with carpet tape. Make sure that you have a light switch at the top of the stairs and the bottom of the stairs. If you do not have them, ask someone to add them for you. What else can I do to help prevent falls? Wear shoes that: Do not have high heels. Have rubber bottoms. Are comfortable and fit you well. Are closed at the toe. Do not wear sandals. If you use a stepladder: Make sure that it is fully opened. Do not climb a closed stepladder. Make sure that both sides of the stepladder are locked into place. Ask someone to hold it for you, if possible. Clearly mark and make sure that you can see: Any grab bars or handrails. First and last steps. Where the edge of each step is. Use tools that help you move around (mobility aids) if they are needed. These include: Canes. Walkers. Scooters. Crutches. Turn on the lights when you go into a dark area. Replace any light bulbs as soon as they burn out. Set up your furniture so you have a clear path. Avoid moving your furniture around. If any of your floors are uneven, fix them. If there are any pets around you, be aware of where they are. Review your medicines with your doctor. Some medicines can make you feel dizzy. This can increase your chance of falling. Ask your doctor what other things that you can do to help prevent falls. This information is not intended to replace advice given to you by your health care provider. Make sure you discuss any questions you have with your health care provider. Document Released: 01/31/2009 Document Revised: 09/12/2015 Document Reviewed: 05/11/2014 Elsevier Interactive Patient Education  2017 [employer].

## 2022-10-06 NOTE — Progress Notes (Signed)
Subjective:   Patricia Chandler is a 67 y.o. female who presents for Medicare Annual (Subsequent) preventive examination.  I connected with  Patricia Chandler on 10/06/22 by a audio enabled telemedicine application and verified that I am speaking with the correct person using two identifiers.  Patient Location: Home  Provider Location: Office/Clinic  I discussed the limitations of evaluation and management by telemedicine. The patient expressed understanding and agreed to proceed.  Patient Medicare AWV questionnaire was completed by the patient on 10/05/22; I have confirmed that all information answered by patient is correct and no changes since this date.  Review of Systems     Cardiac Risk Factors include: advanced age (>67men, >40 women)     Objective:    Today's Vitals   10/06/22 1019  Weight: 150 lb (68 kg)   Body mass index is 26.57 kg/m.     10/06/2022   10:25 AM 09/01/2021    3:12 PM 08/05/2021   10:04 AM 05/03/2019    9:59 AM 04/12/2019    1:12 PM 02/09/2019    3:05 PM 01/26/2019   12:22 PM  Advanced Directives  Does Patient Have a Medical Advance Directive? Yes Yes Yes Yes Yes No No  Type of Estate agent of [place];Living will Healthcare Power of [place];Living will Living will Healthcare Power of [place];Living will Healthcare Power of [place];Living will    Does patient want to make changes to medical advance directive?     No - Patient declined    Copy of Healthcare Power of Attorney in Chart? No - copy requested Yes - validated most recent copy scanned in chart (See row information)  Yes - validated most recent copy scanned in chart (See row information) No - copy requested    Would patient like information on creating a medical advance directive?  No - Patient declined    No - Patient declined No - Patient declined    Current Medications (verified) Outpatient Encounter Medications as of 10/06/2022  Medication Sig    anastrozole (ARIMIDEX) 1 MG tablet TAKE 1 TABLET BY MOUTH DAILY   Biotin 1 MG CAPS Take by mouth.   calcium-vitamin D (OSCAL WITH D) 500-200 MG-UNIT tablet Take 1 tablet by mouth daily with breakfast.   denosumab (PROLIA) 60 MG/ML SOLN injection Inject 60 mg into the skin every 6 (six) months. Administer in upper arm, thigh, or abdomen   Lactobacillus (PROBIOTIC ACIDOPHILUS PO) Take 1 capsule by mouth daily.    latanoprost (XALATAN) 0.005 % ophthalmic solution 1 drop at bedtime.   omeprazole (PRILOSEC) 20 MG capsule Take 20 mg by mouth daily as needed (heartburn).   Turmeric POWD Take by mouth.   zolpidem (AMBIEN) 5 MG tablet TAKE 1 TABLET BY MOUTH EACH NIGHT AT BEDTIME   Suvorexant (BELSOMRA) 5 MG TABS Take 1 tablet (5 mg total) by mouth at bedtime as needed. Do not take with Ambien- would be substitute (Patient not taking: Reported on 10/06/2022)   No facility-administered encounter medications on file as of 10/06/2022.    Allergies (verified) Patient has no known allergies.   History: Past Medical History:  Diagnosis Date   Anemia    due to chemo   Anxiety    Breast cancer (HCC) dx'd 2017   right   Cardiomyopathy (HCC)    mild, per cardiology note; due to Herceptin   Dental crowns present    Family history of adverse reaction to anesthesia    pt's daughter has hx. of  post-op nausea   GERD (gastroesophageal reflux disease)    Heart murmur    History of breast cancer 2017   right   History of chemotherapy    finished chemo 04/15/2016   Personal history of chemotherapy    Personal history of radiation therapy    Right Breast Cancer   Rash 08/04/2016   under right arm - states recent tick bite; to see MD 08/05/2016   Recurrent breast cancer, right (HCC) dx'd 10/2018   Runny nose 06/06/2016   clear drainage, per pt.   Past Surgical History:  Procedure Laterality Date   AUGMENTATION MAMMAPLASTY Left    BREAST BIOPSY Right 11/30/2018   Procedure: RIGHT BREAST CHEST WALL  RECURRENCE EXCISION;  Surgeon: Emelia Loron, MD;  Location: [facility] OR;  Service: General;  Laterality: Right;   BREAST ENHANCEMENT SURGERY Bilateral 2005   breast mass removal Right 11/30/2018   BREAST RECONSTRUCTION WITH PLACEMENT OF TISSUE EXPANDER AND FLEX HD (ACELLULAR HYDRATED DERMIS) Right 05/19/2016   Procedure: RIGHT BREAST RECONSTRUCTION WITH PLACEMENT OF TISSUE EXPANDER AND  ALLODERM.;  Surgeon: Glenna Fellows, MD;  Location: [facility];  Service: Plastics;  Laterality: Right;   COLONOSCOPY     COLONOSCOPY WITH PROPOFOL  10/02/2014   DEBRIDEMENT AND CLOSURE WOUND Right 06/08/2016   Procedure: DEBRIDEMENT RIGHT MASTECTOMY FLAP, TISSUE EXPANSION OF RIGHT CHEST;  Surgeon: Glenna Fellows, MD;  Location: [facility] OR;  Service: Plastics;  Laterality: Right;   MASTECTOMY Right    MASTOPEXY Left 08/11/2016   Procedure: LEFT BREAST MASTOPEXY;  Surgeon: Glenna Fellows, MD;  Location: [facility];  Service: Plastics;  Laterality: Left;   NIPPLE SPARING MASTECTOMY/SENTINAL LYMPH NODE BIOPSY/RECONSTRUCTION/PLACEMENT OF TISSUE EXPANDER Right 05/19/2016   Procedure: RIGHT NIPPLE SPARING MASTECTOMY WITH SENTINAL LYMPH NODE BIOPSY WITH BLUE DYE INJECTION;  Surgeon: Emelia Loron, MD;  Location: [facility];  Service: General;  Laterality: Right;   PORT-A-CATH REMOVAL Right 04/26/2019   Procedure: REMOVAL PORT-A-CATH;  Surgeon: Emelia Loron, MD;  Location: [facility];  Service: General;  Laterality: Right;   PORTACATH PLACEMENT N/A 12/30/2015   Procedure: INSERTION PORT-A-CATH WITH [place];  Surgeon: Emelia Loron, MD;  Location: [facility];  Service: General;  Laterality: N/A;   PORTACATH PLACEMENT N/A 11/30/2018   Procedure: INSERTION PORT-A-CATH WITH ULTRASOUND;  Surgeon: Emelia Loron, MD;  Location: [facility] OR;  Service: General;  Laterality: N/A;   REDUCTION MAMMAPLASTY     REMOVAL OF TISSUE EXPANDER AND PLACEMENT OF  IMPLANT Right 08/11/2016   Procedure: REMOVAL OF RIGHT  TISSUE EXPANDER AND PLACEMENT OF IMPLANT;  Surgeon: Glenna Fellows, MD;  Location: [facility];  Service: Plastics;  Laterality: Right;   Family History  Problem Relation Age of Onset   Breast cancer Maternal Aunt        dx in her 22s   Cancer Maternal Aunt    Stroke Father        late 62s   Hypertension Father    Pulmonary fibrosis Mother    Vision loss Mother    Breast cancer Paternal Aunt 38   Cancer Paternal Grandmother        abdominal; cervical vs uterine dx in her 56s   Stomach cancer Maternal Grandmother    Parkinson's disease Maternal Grandfather    Anesthesia problems Daughter        post-op nausea   Colon cancer Neg Hx    Esophageal cancer Neg Hx    Rectal cancer Neg Hx  Social History   Socioeconomic History   Marital status: Married    Spouse name: Not on file   Number of children: 2   Years of education: Not on file   Highest education level: Not on file  Occupational History   Occupation: retired  Tobacco Use   Smoking status: Never   Smokeless tobacco: Never  Vaping Use   Vaping Use: Never used  Substance and Sexual Activity   Alcohol use: Yes    Alcohol/week: 3.0 standard drinks of alcohol    Types: 3 Glasses of wine per week    Comment: occasionally   Drug use: No   Sexual activity: Not on file  Other Topics Concern   Not on file  Social History Narrative   Married 1978 (Al). 2 girls 33 and 36 in 2018. Lanora Manis (liz) lives in [place], [place] with 72 yo son (single right now). and [name] in [place], [place]--> now [place] with husband and 50 year old daughter (born with heart defect with missing valve and initially not closed VSD--still weighs heavily on family- likely will have to be repaired).       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    Graduated from [employer]. [employer] prior.       Hobbies:  gardening, decorating, sewing, enjoys creating   Social Determinants of Health   Financial Resource Strain: Low Risk  (10/05/2022)   Overall Financial Resource Strain (CARDIA)    Difficulty of Paying Living Expenses: Not hard at all  Food Insecurity: No Food Insecurity (10/05/2022)   Hunger Vital Sign    Worried About Running Out of Food in the Last Year: Never true    Ran Out of Food in the Last Year: Never true  Transportation Needs: No Transportation Needs (10/05/2022)   PRAPARE - Administrator, Civil Service (Medical): No    Lack of Transportation (Non-Medical): No  Physical Activity: Sufficiently Active (10/05/2022)   Exercise Vital Sign    Days of Exercise per Week: 6 days    Minutes of Exercise per Session: 50 min  Stress: No Stress Concern Present (10/05/2022)   [employer] of Occupational Health - Occupational Stress Questionnaire    Feeling of Stress : Not at all  Social Connections: Moderately Integrated (10/05/2022)   Social Connection and Isolation Panel [NHANES]    Frequency of Communication with Friends and Family: Twice a week    Frequency of Social Gatherings with Friends and Family: Three times a week    Attends Religious Services: Never    Active Member of Clubs or Organizations: Yes    Attends Engineer, structural: More than 4 times per year    Marital Status: Married    Tobacco Counseling Counseling given: Not Answered   Clinical Intake:  Pre-visit preparation completed: Yes  Pain : No/denies pain     BMI - recorded: 26.57 Nutritional Status: BMI 25 -29 Overweight Nutritional Risks: None Diabetes: No  How often do you need to have someone help you when you read instructions, pamphlets, or other written materials from your doctor or pharmacy?: 1 - Never  Interpreter Needed?: No  Information entered by :: Lanier Ensign, LPN   Activities of Daily Living    10/05/2022    8:17 AM  In your present state of health, do you  have any difficulty performing the following activities:  Hearing? 0  Vision? 0  Difficulty  concentrating or making decisions? 0  Walking or climbing stairs? 0  Dressing or bathing? 0  Doing errands, shopping? 0  Preparing Food and eating ? N  Using the Toilet? N  In the past six months, have you accidently leaked urine? N  Do you have problems with loss of bowel control? N  Managing your Medications? N  Managing your Finances? N  Housekeeping or managing your Housekeeping? N    Patient Care Team: [name], MD as PCP - General (Family Medicine) Donnelly Angelica, RN as Oncology Nurse Navigator Pershing Proud, RN as Oncology Nurse Navigator  Indicate any recent Medical Services you may have received from other than [facility] providers in the past year (date may be approximate).     Assessment:   This is a routine wellness examination for Alynda.  Hearing/Vision screen Hearing Screening - Comments:: Pt denies any hearing issues  Vision Screening - Comments:: Pt follows up with Dr Cletis [place] scott for annual eye exams   Dietary issues and exercise activities discussed:     Goals Addressed             This Visit's Progress    Patient Stated       Improving overall health        Depression Screen    10/06/2022   10:23 AM 03/02/2022   10:25 AM 11/28/2021    9:50 AM 08/05/2021   10:02 AM 09/20/2020    2:31 PM 06/13/2018    2:20 PM 12/19/2015   12:59 PM  PHQ 2/9 Scores  PHQ - 2 Score 0 0 0 0 0 0 0    Fall Risk    10/05/2022    8:17 AM 09/25/2022    9:54 AM 08/05/2021   10:05 AM 09/20/2020    2:31 PM 12/19/2015   12:59 PM  Fall Risk   Falls in the past year? 0 0 0 0 No  Number falls in past yr: 0 0 0    Injury with Fall? 0 0 0    Risk for fall due to : Impaired vision No Fall Risks Impaired vision No Fall Risks   Follow up Falls prevention discussed Falls evaluation completed Falls prevention discussed      MEDICARE RISK AT HOME:  Medicare Risk at Home - 10/06/22  1036     Any stairs in or around the home? Yes    If so, are there any without handrails? No    Home free of loose throw rugs in walkways, pet beds, electrical cords, etc? Yes    Adequate lighting in your home to reduce risk of falls? Yes    Life alert? No    Use of a cane, walker or w/c? No    Grab bars in the bathroom? No    Shower chair or bench in shower? No    Elevated toilet seat or a handicapped toilet? No             TIMED UP AND GO:  Was the test performed?  No    Cognitive Function:        08/05/2021   10:07 AM  6CIT Screen  What Year? 0 points  What month? 0 points  What time? 0 points  Count back from 20 0 points  Months in reverse 0 points  Repeat phrase 0 points  Total Score 0 points    Immunizations Immunization History  Administered Date(s) Administered   Fluad Quad(high Dose 65+) 01/22/2020, 12/29/2021  Influenza, High Dose Seasonal PF 12/10/2018   Influenza,inj,Quad PF,6+ Mos 02/18/2016, 02/01/2017, 02/28/2018   Influenza-Unspecified 01/19/2016, 02/19/2020, 02/26/2021   PFIZER(Purple Top)SARS-COV-2 Vaccination 05/12/2019, 06/02/2019, 12/20/2019, 11/13/2020, 02/26/2021   PNEUMOCOCCAL CONJUGATE-20 12/10/2020   Pfizer Covid-19 Vaccine Bivalent Booster 52yrs & up 02/26/2021   Pneumococcal Conjugate-13 11/02/2018   Pneumococcal-Unspecified 12/10/2020   Tdap 07/20/2011   Zoster Recombinat (Shingrix) 06/09/2017, 08/12/2017    TDAP status: Due, Education has been provided regarding the importance of this vaccine. Advised may receive this vaccine at local pharmacy or Health Dept. Aware to provide a copy of the vaccination record if obtained from local pharmacy or Health Dept. Verbalized acceptance and understanding.  Flu Vaccine status: Up to date  Pneumococcal vaccine status: Up to date  Covid-19 vaccine status: Completed vaccines  Qualifies for Shingles Vaccine? Yes   Zostavax completed Yes   Shingrix Completed?: Yes  Screening  Tests Health Maintenance  Topic Date Due   COVID-19 Vaccine (6 - 2023-24 season) 10/11/2022 (Originally 12/19/2021)   INFLUENZA VACCINE  11/19/2022   Medicare Annual Wellness (AWV)  10/06/2023   MAMMOGRAM  06/08/2024   Colonoscopy  09/08/2025   Pneumonia Vaccine 62+ Years old  Completed   DEXA SCAN  Completed   Hepatitis C Screening  Completed   Zoster Vaccines- Shingrix  Completed   HPV VACCINES  Aged Out   DTaP/Tdap/Td  Discontinued    Health Maintenance  There are no preventive care reminders to display for this patient.   Colorectal cancer screening: Type of screening: Colonoscopy. Completed 09/09/18. Repeat every 7 years  Mammogram status: Completed 06/08/22. Repeat every year  Bone Density status: Completed 04/30/22. Results reflect: Bone density results: OSTEOPOROSIS. Repeat every 2 years.   Additional Screening:  Hepatitis C Screening:  Completed 06/13/18  Vision Screening: Recommended annual ophthalmology exams for early detection of glaucoma and other disorders of the eye. Is the patient up to date with their annual eye exam?  Yes  Who is the provider or what is the name of the office in which the patient attends annual eye exams? Dr Fredrich Birks If pt is not established with a provider, would they like to be referred to a provider to establish care? No .   Dental Screening: Recommended annual dental exams for proper oral hygiene   Community Resource Referral / Chronic Care Management: CRR required this visit?  No   CCM required this visit?  No     Plan:     I have personally reviewed and noted the following in the patient's chart:   Medical and social history Use of alcohol, tobacco or illicit drugs  Current medications and supplements including opioid prescriptions. Patient is not currently taking opioid prescriptions. Functional ability and status Nutritional status Physical activity Advanced directives List of other physicians Hospitalizations,  surgeries, and ER visits in previous 12 months Vitals Screenings to include cognitive, depression, and falls Referrals and appointments  In addition, I have reviewed and discussed with patient certain preventive protocols, quality metrics, and best practice recommendations. A written personalized care plan for preventive services as well as general preventive health recommendations were provided to patient.     Marzella Schlein, LPN   01/02/7828   After Visit Summary: (MyChart) Due to this being a telephonic visit, the after visit summary with patients personalized plan was offered to patient via MyChart   Nurse Notes: none

## 2022-10-07 ENCOUNTER — Other Ambulatory Visit: Payer: Self-pay

## 2022-10-07 DIAGNOSIS — Z170 Estrogen receptor positive status [ER+]: Secondary | ICD-10-CM

## 2022-10-08 ENCOUNTER — Inpatient Hospital Stay: Payer: Medicare Other | Attending: Adult Health

## 2022-10-08 ENCOUNTER — Other Ambulatory Visit: Payer: Self-pay

## 2022-10-08 ENCOUNTER — Encounter: Payer: Self-pay | Admitting: Family Medicine

## 2022-10-08 ENCOUNTER — Inpatient Hospital Stay: Payer: Medicare Other | Admitting: Adult Health

## 2022-10-08 ENCOUNTER — Encounter: Payer: Self-pay | Admitting: Adult Health

## 2022-10-08 VITALS — BP 124/89 | HR 84 | Temp 97.7°F | Resp 18 | Ht 63.0 in | Wt 147.0 lb

## 2022-10-08 DIAGNOSIS — E559 Vitamin D deficiency, unspecified: Secondary | ICD-10-CM

## 2022-10-08 DIAGNOSIS — Z800 Family history of malignant neoplasm of digestive organs: Secondary | ICD-10-CM | POA: Diagnosis not present

## 2022-10-08 DIAGNOSIS — Z79899 Other long term (current) drug therapy: Secondary | ICD-10-CM | POA: Insufficient documentation

## 2022-10-08 DIAGNOSIS — Z170 Estrogen receptor positive status [ER+]: Secondary | ICD-10-CM

## 2022-10-08 DIAGNOSIS — Z809 Family history of malignant neoplasm, unspecified: Secondary | ICD-10-CM | POA: Diagnosis not present

## 2022-10-08 DIAGNOSIS — Z803 Family history of malignant neoplasm of breast: Secondary | ICD-10-CM | POA: Insufficient documentation

## 2022-10-08 DIAGNOSIS — Z1231 Encounter for screening mammogram for malignant neoplasm of breast: Secondary | ICD-10-CM

## 2022-10-08 DIAGNOSIS — Z79811 Long term (current) use of aromatase inhibitors: Secondary | ICD-10-CM | POA: Insufficient documentation

## 2022-10-08 DIAGNOSIS — C50211 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 female breast: Secondary | ICD-10-CM

## 2022-10-08 LAB — CBC WITH DIFFERENTIAL (CANCER CENTER ONLY)
Abs Immature Granulocytes: 0.04 10*3/uL (ref 0.00–0.07)
Basophils Absolute: 0.1 10*3/uL (ref 0.0–0.1)
Basophils Relative: 1 %
Eosinophils Absolute: 0.2 10*3/uL (ref 0.0–0.5)
Eosinophils Relative: 3 %
HCT: 39.1 % (ref 36.0–46.0)
Hemoglobin: 13.3 g/dL (ref 12.0–15.0)
Immature Granulocytes: 1 %
Lymphocytes Relative: 11 %
Lymphs Abs: 0.8 10*3/uL (ref 0.7–4.0)
MCH: 30.7 pg (ref 26.0–34.0)
MCHC: 34 g/dL (ref 30.0–36.0)
MCV: 90.3 fL (ref 80.0–100.0)
Monocytes Absolute: 0.6 10*3/uL (ref 0.1–1.0)
Monocytes Relative: 8 %
Neutro Abs: 5.5 10*3/uL (ref 1.7–7.7)
Neutrophils Relative %: 76 %
Platelet Count: 236 10*3/uL (ref 150–400)
RBC: 4.33 MIL/uL (ref 3.87–5.11)
RDW: 13.5 % (ref 11.5–15.5)
WBC Count: 7.2 10*3/uL (ref 4.0–10.5)
nRBC: 0 % (ref 0.0–0.2)

## 2022-10-08 LAB — LIPID PANEL
Cholesterol: 206 mg/dL — ABNORMAL HIGH (ref 0–200)
HDL: 91 mg/dL (ref >40)
LDL Cholesterol: 108 mg/dL — ABNORMAL HIGH (ref 0–99)
Total CHOL/HDL Ratio: 2.3 RATIO
Triglycerides: 35 mg/dL (ref <150)
VLDL: 7 mg/dL (ref 0–40)

## 2022-10-08 LAB — CMP (CANCER CENTER ONLY)
ALT: 19 U/L (ref 0–44)
AST: 20 U/L (ref 15–41)
Albumin: 4 g/dL (ref 3.5–5.0)
Alkaline Phosphatase: 52 U/L (ref 38–126)
Anion gap: 6 (ref 5–15)
BUN: 20 mg/dL (ref 8–23)
CO2: 26 mmol/L (ref 22–32)
Calcium: 9.2 mg/dL (ref 8.9–10.3)
Chloride: 105 mmol/L (ref 98–111)
Creatinine: 0.74 mg/dL (ref 0.44–1.00)
GFR, Estimated: >60 mL/min (ref >60)
Glucose, Bld: 97 mg/dL (ref 70–99)
Potassium: 3.8 mmol/L (ref 3.5–5.1)
Sodium: 137 mmol/L (ref 135–145)
Total Bilirubin: 0.6 mg/dL (ref 0.3–1.2)
Total Protein: 6.2 g/dL — ABNORMAL LOW (ref 6.5–8.1)

## 2022-10-08 LAB — VITAMIN B12: Vitamin B-12: 2891 pg/mL — ABNORMAL HIGH (ref 180–914)

## 2022-10-08 NOTE — Progress Notes (Signed)
Orders entered for signatera testing per MD. Requisition and all supporting documents faxed to [phone] with fax confirmation.   

## 2022-10-08 NOTE — Progress Notes (Signed)
[facility] Cancer Follow up:    Patricia Majestic, MD [address] [place] [address]   DIAGNOSIS:  Cancer Staging  Breast cancer of upper-inner quadrant of right female breast [facility]) Staging form: Breast, AJCC 8th Edition - Clinical stage from 11/01/2018: Stage IA (rcT1c, cN0, cM0, G2, ER+, PR-, HER2-) - Unsigned Stage prefix: Recurrence Histologic grading system: 3 grade system - Pathologic stage from 11/30/2018: Stage IIA (pT2, pN0, cM0, G3, ER+, PR-, HER2-) - Signed by Loa Socks, NP on 12/14/2018 Stage prefix: Initial diagnosis Histologic grading system: 3 grade system   SUMMARY OF ONCOLOGIC HISTORY: Oncology History  Breast cancer of upper-inner quadrant of right female breast (HCC)  12/05/2015 Mammogram   Right breast mass 2:00 4 cm from nipple: 1.7 cm, T1c N0 stage IA; right breast 2:00 6 cm from nipple 1.3 cm mass, 7 mm satellite nodule between the 2 masses, T1 cN0 stage IA   12/06/2015 Initial Diagnosis   Right breast biopsy 2:00 6 cm from nipple: Grade 2-3 invasive ductal cancer, ER 90%, PR 2%, Ki-67 80%, HER-2 positive ratio 2.07; right biopsy 2:00 4 cm from nipple: IDC with DCIS, ER 95%, PR 10%, Ki-67 90%, HER-2 negative ratio 1.27   12/18/2015 Breast MRI   3 enhancing masses in the upper inner quadrant right breast spanning an area 3.7 cm (1.7 cm, 1 cm, 1.4 cm) no lymph node enlargement   12/31/2015 - 04/15/2016 Neo-Adjuvant Chemotherapy   TCH Perjeta 6 cycles followed by Herceptin, Perjeta maintenance for 1 year completed 10/20/2016   01/08/2016 - 01/12/2016 Hospital Admission   Neutropenic fever hospitalization (because patient did not receive Neulasta with cycle 1)   01/10/2016 Miscellaneous   Genetic testing was normal did not reveal any mutations   04/16/2016 Breast MRI   Near CR to therapy. Previous 2 biopsied massses are no longer seen. Third satellite lesion is smaller from 10 mm to 5.3 mm, no abnormal LN.   05/19/2016 Surgery    Right mastectomy: IDC grade 3, 1.3 cm, low-grade DCIS, LVIDS present, margins negative, 0/1 lymph node negative, ypT1CypN0 stage IA; repeat HER-2 negative ratio 1.41   05/19/2016 Surgery   Right breast reconstruction with tissue expander. Acellular dermis for breast reconstruction (Dr.Thimappa)    06/22/2016 - 10/2018 Anti-estrogen oral therapy   Letrozole 2.5 mg daily   11/01/2018 Relapse/Recurrence   Patient palpated lump in the UIQ at surgical scar of reconstructed right breast. US of the right breast showed a 1.8cm mass suspicious for recurrent breast cancer. Biopsy showed grade 2 invasive ductal carcinoma, HER-2 negative (1+), ER 90%, PR negative, Ki67 40%.   11/11/2018 Oncotype testing   Oncotype score 52: Greater than 39% risk of distant recurrence with hormone therapy alone, chemo benefit greater than 15%   11/30/2018 Surgery   Right lumpectomy Dwain Sarna): IDC, grade 3, 2.1cm, perineural invasion present, carcinoma present focally at the posterior margin and involving skeletal muscle.   11/30/2018 Cancer Staging   Staging form: Breast, AJCC 8th Edition - Pathologic stage from 11/30/2018: Stage IIA (pT2, pN0, cM0, G3, ER+, PR-, HER2-) - Signed by Loa Socks, NP on 12/14/2018   12/23/2018 - 04/07/2019 Chemotherapy   Taxotere and Cytoxan x6 cycles   05/18/2019 - 06/29/2019 Radiation Therapy   Adjuvant radiation   07/2019 -  Anti-estrogen oral therapy   Anastrozole daily   09/2019 - 09/2021 Chemotherapy   Abemaciclib 300 mg daily started, decrease to 150 mg daily 10/15/2019 due to abdominal pain  CURRENT THERAPY: Anastrozole  INTERVAL HISTORY: Patricia Chandler 67 y.o. female returns for f/u of her h/o recurrent breast cancer.  She is doing well today.  She continues on anastrozole with good tolerance.  Her screening mammogram of her left breast is recommended to occur in August 2024 with repeat breast MRI recommended in February 2025.  She denies any breast  concerns today.  She continues on Prolia every 6 months and is due for this again in August.  She has this with her primary care provider however is hoping to switch to have this done at our office in February.  Patricia Chandler is an avid gardener and she enjoys being outdoors particularly during the warmer months.  Patient Active Problem List   Diagnosis Date Noted   Aortic atherosclerosis (HCC) 09/25/2022   AC (acromioclavicular) arthritis 06/17/2022   Encounter for counseling 12/02/2021   Acquired absence of nipple 09/16/2021   Osteoporosis 05/07/2020   Snoring 01/23/2020   Insomnia 01/08/2020   Liver lesion 06/21/2018   Intra-abdominal abscess (HCC) 06/14/2018   Acute diverticulitis 06/14/2018   Acute bursitis of right shoulder 06/08/2017   Acute shoulder bursitis, left 03/08/2017   DDD (degenerative disc disease), cervical 10/08/2016   Hypokalemia 03/30/2016   Chemotherapy-induced thrombocytopenia 03/30/2016   Genetic testing 01/10/2016   Port catheter in place 01/08/2016   History of sepsis 01/08/2016   GERD (gastroesophageal reflux disease) 01/08/2016   Breast cancer of upper-inner quadrant of right female breast (HCC) 12/18/2015   Family history of breast cancer    Internal hemorrhoid 01/26/2011    has No Known Allergies.  MEDICAL HISTORY: Past Medical History:  Diagnosis Date   Anemia    due to chemo   Anxiety    Breast cancer (HCC) dx'd 2017   right   Cardiomyopathy (HCC)    mild, per cardiology note; due to Herceptin   Dental crowns present    Family history of adverse reaction to anesthesia    pt's daughter has hx. of post-op nausea   GERD (gastroesophageal reflux disease)    Heart murmur    History of breast cancer 2017   right   History of chemotherapy    finished chemo 04/15/2016   Personal history of chemotherapy    Personal history of radiation therapy    Right Breast Cancer   Rash 08/04/2016   under right arm - states recent tick bite; to see MD  08/05/2016   Recurrent breast cancer, right (HCC) dx'd 10/2018   Runny nose 06/06/2016   clear drainage, per pt.    SURGICAL HISTORY: Past Surgical History:  Procedure Laterality Date   AUGMENTATION MAMMAPLASTY Left    BREAST BIOPSY Right 11/30/2018   Procedure: RIGHT BREAST CHEST WALL RECURRENCE EXCISION;  Surgeon: Emelia Loron, MD;  Location: [facility] OR;  Service: General;  Laterality: Right;   BREAST ENHANCEMENT SURGERY Bilateral 2005   breast mass removal Right 11/30/2018   BREAST RECONSTRUCTION WITH PLACEMENT OF TISSUE EXPANDER AND FLEX HD (ACELLULAR HYDRATED DERMIS) Right 05/19/2016   Procedure: RIGHT BREAST RECONSTRUCTION WITH PLACEMENT OF TISSUE EXPANDER AND  ALLODERM.;  Surgeon: Glenna Fellows, MD;  Location: [facility];  Service: Plastics;  Laterality: Right;   COLONOSCOPY     COLONOSCOPY WITH PROPOFOL  10/02/2014   DEBRIDEMENT AND CLOSURE WOUND Right 06/08/2016   Procedure: DEBRIDEMENT RIGHT MASTECTOMY FLAP, TISSUE EXPANSION OF RIGHT CHEST;  Surgeon: Glenna Fellows, MD;  Location: [facility] OR;  Service: Plastics;  Laterality: Right;   MASTECTOMY Right  MASTOPEXY Left 08/11/2016   Procedure: LEFT BREAST MASTOPEXY;  Surgeon: Glenna Fellows, MD;  Location: [facility];  Service: Plastics;  Laterality: Left;   NIPPLE SPARING MASTECTOMY/SENTINAL LYMPH NODE BIOPSY/RECONSTRUCTION/PLACEMENT OF TISSUE EXPANDER Right 05/19/2016   Procedure: RIGHT NIPPLE SPARING MASTECTOMY WITH SENTINAL LYMPH NODE BIOPSY WITH BLUE DYE INJECTION;  Surgeon: Emelia Loron, MD;  Location: [facility];  Service: General;  Laterality: Right;   PORT-A-CATH REMOVAL Right 04/26/2019   Procedure: REMOVAL PORT-A-CATH;  Surgeon: Emelia Loron, MD;  Location: [facility];  Service: General;  Laterality: Right;   PORTACATH PLACEMENT N/A 12/30/2015   Procedure: INSERTION PORT-A-CATH WITH [place];  Surgeon: Emelia Loron, MD;  Location: [facility];   Service: General;  Laterality: N/A;   PORTACATH PLACEMENT N/A 11/30/2018   Procedure: INSERTION PORT-A-CATH WITH ULTRASOUND;  Surgeon: Emelia Loron, MD;  Location: [facility] OR;  Service: General;  Laterality: N/A;   REDUCTION MAMMAPLASTY     REMOVAL OF TISSUE EXPANDER AND PLACEMENT OF IMPLANT Right 08/11/2016   Procedure: REMOVAL OF RIGHT  TISSUE EXPANDER AND PLACEMENT OF IMPLANT;  Surgeon: Glenna Fellows, MD;  Location: [facility];  Service: Plastics;  Laterality: Right;    SOCIAL HISTORY: Social History   Socioeconomic History   Marital status: Married    Spouse name: Not on file   Number of children: 2   Years of education: Not on file   Highest education level: Not on file  Occupational History   Occupation: retired  Tobacco Use   Smoking status: Never   Smokeless tobacco: Never  Vaping Use   Vaping Use: Never used  Substance and Sexual Activity   Alcohol use: Yes    Alcohol/week: 3.0 standard drinks of alcohol    Types: 3 Glasses of wine per week    Comment: occasionally   Drug use: No   Sexual activity: Not on file  Other Topics Concern   Not on file  Social History Narrative   Married 1978 (Al). 2 girls 33 and 36 in 2018. Lanora Manis (liz) lives in [place], [place] with 6 yo son (single right now). and [name] in [place], [place]--> now [place] with husband and 79 year old daughter (born with heart defect with missing valve and initially not closed VSD--still weighs heavily on family- likely will have to be repaired).       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    Graduated from [employer]. [employer] prior.       Hobbies: gardening, decorating, sewing, enjoys creating   Social Determinants of Health   Financial Resource Strain: Low Risk  (10/05/2022)   Overall Financial Resource Strain (CARDIA)    Difficulty of Paying Living Expenses: Not hard at all  Food Insecurity: No Food  Insecurity (10/05/2022)   Hunger Vital Sign    Worried About Running Out of Food in the Last Year: Never true    Ran Out of Food in the Last Year: Never true  Transportation Needs: No Transportation Needs (10/05/2022)   PRAPARE - Administrator, Civil Service (Medical): No    Lack of Transportation (Non-Medical): No  Physical Activity: Sufficiently Active (10/05/2022)   Exercise Vital Sign    Days of Exercise per Week: 6 days    Minutes of Exercise per Session: 50 min  Stress: No Stress Concern Present (10/05/2022)   [employer] of Occupational Health - Occupational Stress  Questionnaire    Feeling of Stress : Not at all  Social Connections: Moderately Integrated (10/05/2022)   Social Connection and Isolation Panel [NHANES]    Frequency of Communication with Friends and Family: Twice a week    Frequency of Social Gatherings with Friends and Family: Three times a week    Attends Religious Services: Never    Active Member of Clubs or Organizations: Yes    Attends Banker Meetings: More than 4 times per year    Marital Status: Married  Catering manager Violence: Not At Risk (10/06/2022)   Humiliation, Afraid, Rape, and Kick questionnaire    Fear of Current or Ex-Partner: No    Emotionally Abused: No    Physically Abused: No    Sexually Abused: No    FAMILY HISTORY: Family History  Problem Relation Age of Onset   Breast cancer Maternal Aunt        dx in her 27s   Cancer Maternal Aunt    Stroke Father        late 53s   Hypertension Father    Pulmonary fibrosis Mother    Vision loss Mother    Breast cancer Paternal Aunt 36   Cancer Paternal Grandmother        abdominal; cervical vs uterine dx in her 22s   Stomach cancer Maternal Grandmother    Parkinson's disease Maternal Grandfather    Anesthesia problems Daughter        post-op nausea   Colon cancer Neg Hx    Esophageal cancer Neg Hx    Rectal cancer Neg Hx     Review of Systems   Constitutional:  Negative for appetite change, chills, fatigue, fever and unexpected weight change.  HENT:   Negative for hearing loss, lump/mass and trouble swallowing.   Eyes:  Negative for eye problems and icterus.  Respiratory:  Negative for chest tightness, cough and shortness of breath.   Cardiovascular:  Negative for chest pain, leg swelling and palpitations.  Gastrointestinal:  Negative for abdominal distention, abdominal pain, constipation, diarrhea, nausea and vomiting.  Endocrine: Negative for hot flashes.  Genitourinary:  Negative for difficulty urinating.   Musculoskeletal:  Negative for arthralgias.  Skin:  Negative for itching and rash.  Neurological:  Negative for dizziness, extremity weakness, headaches and numbness.  Hematological:  Negative for adenopathy. Does not bruise/bleed easily.  Psychiatric/Behavioral:  Negative for depression. The patient is not nervous/anxious.       PHYSICAL EXAMINATION    Vitals:   10/08/22 1009  BP: 124/89  Pulse: 84  Resp: 18  Temp: 97.7 F (36.5 C)  SpO2: 100%    Physical Exam Constitutional:      General: She is not in acute distress.    Appearance: Normal appearance. She is not toxic-appearing.  HENT:     Head: Normocephalic and atraumatic.     Mouth/Throat:     Mouth: Mucous membranes are moist.     Pharynx: Oropharynx is clear. No oropharyngeal exudate or posterior oropharyngeal erythema.  Eyes:     General: No scleral icterus. Cardiovascular:     Rate and Rhythm: Normal rate and regular rhythm.     Pulses: Normal pulses.     Heart sounds: Normal heart sounds.  Pulmonary:     Effort: Pulmonary effort is normal.     Breath sounds: Normal breath sounds.  Chest:     Comments: Right breast status postmastectomy and reconstruction no sign of local recurrence left breast status post reduction, benign  Abdominal:     General: Abdomen is flat. Bowel sounds are normal. There is no distension.     Palpations: Abdomen is  soft.     Tenderness: There is no abdominal tenderness.  Musculoskeletal:        General: No swelling.     Cervical back: Neck supple.  Lymphadenopathy:     Cervical: No cervical adenopathy.  Skin:    General: Skin is warm and dry.     Findings: No rash.  Neurological:     General: No focal deficit present.     Mental Status: She is alert.  Psychiatric:        Mood and Affect: Mood normal.        Behavior: Behavior normal.     LABORATORY DATA:  CBC    Component Value Date/Time   WBC 7.2 10/08/2022 0949   WBC 9.6 11/18/2018 0940   RBC 4.33 10/08/2022 0949   HGB 13.3 10/08/2022 0949   HGB 11.3 (L) 10/20/2016 0757   HCT 39.1 10/08/2022 0949   HCT 32.9 (L) 10/20/2016 0757   PLT 236 10/08/2022 0949   PLT 186 10/20/2016 0757   MCV 90.3 10/08/2022 0949   MCV 89.2 10/20/2016 0757   MCH 30.7 10/08/2022 0949   MCHC 34.0 10/08/2022 0949   RDW 13.5 10/08/2022 0949   RDW 14.2 10/20/2016 0757   LYMPHSABS 0.8 10/08/2022 0949   LYMPHSABS 2.0 10/20/2016 0757   MONOABS 0.6 10/08/2022 0949   MONOABS 0.4 10/20/2016 0757   EOSABS 0.2 10/08/2022 0949   EOSABS 0.1 10/20/2016 0757   BASOSABS 0.1 10/08/2022 0949   BASOSABS 0.0 10/20/2016 0757    CMP     Component Value Date/Time   NA 137 10/08/2022 0949   NA 140 10/20/2016 0757   K 3.8 10/08/2022 0949   K 3.9 10/20/2016 0757   CL 105 10/08/2022 0949   CO2 26 10/08/2022 0949   CO2 24 10/20/2016 0757   GLUCOSE 97 10/08/2022 0949   GLUCOSE 111 10/20/2016 0757   BUN 20 10/08/2022 0949   BUN 22.7 10/20/2016 0757   CREATININE 0.74 10/08/2022 0949   CREATININE 0.8 10/20/2016 0757   CALCIUM 9.2 10/08/2022 0949   CALCIUM 9.3 10/20/2016 0757   PROT 6.2 (L) 10/08/2022 0949   PROT 5.9 (L) 10/20/2016 0757   ALBUMIN 4.0 10/08/2022 0949   ALBUMIN 3.6 10/20/2016 0757   AST 20 10/08/2022 0949   AST 16 10/20/2016 0757   ALT 19 10/08/2022 0949   ALT 16 10/20/2016 0757   ALKPHOS 52 10/08/2022 0949   ALKPHOS 54 10/20/2016 0757    BILITOT 0.6 10/08/2022 0949   BILITOT 0.39 10/20/2016 0757   GFRNONAA >60 10/08/2022 0949   GFRAA >60 01/22/2020 1528       ASSESSMENT and THERAPY PLAN:   Breast cancer of upper-inner quadrant of right female breast (HCC) Patricia Chandler is a 67 year old woman with recurrent right breast cancer who continues on anastrozole daily.  Recurrent breast cancer: She will continue on anastrozole daily.  She tolerates this well.  She is due for repeat left breast mammogram in August and breast MRI in February 2025.  She will also begin Signatera testing and I ordered this for her. Bone health: We discussed calcium, vitamin D, and weightbearing exercises.  She will continue on Prolia. Health maintenance: She continues to see her primary care provider regularly.     She knows to call for any questions or concerns before her next visit with [place] which will  be in February 2025 for labs, follow-up, and Prolia. (Receiving prolia in August with her PCP)   All questions were answered. The patient knows to call the clinic with any problems, questions or concerns. We can certainly see the patient much sooner if necessary.  Total encounter time:30 minutes*in face-to-face visit time, chart review, lab review, care coordination, order entry, and documentation of the encounter time.    Lillard Anes, NP 10/08/22 11:05 AM Medical Oncology and Hematology [facility] [address] [place], [place] [address] Tel. [phone]    Fax. [phone]  *Total Encounter Time as defined by the Centers for Medicare and Medicaid Services includes, in addition to the face-to-face time of a patient visit (documented in the note above) non-face-to-face time: obtaining and reviewing outside history, ordering and reviewing medications, tests or procedures, care coordination (communications with other health care professionals or caregivers) and documentation in the medical record.

## 2022-10-08 NOTE — Assessment & Plan Note (Signed)
Patricia Chandler is a 67 year old woman with recurrent right breast cancer who continues on anastrozole daily.  Recurrent breast cancer: She will continue on anastrozole daily.  She tolerates this well.  She is due for repeat left breast mammogram in August and breast MRI in February 2025.  She will also begin Signatera testing and I ordered this for her. Bone health: We discussed calcium, vitamin D, and weightbearing exercises.  She will continue on Prolia. Health maintenance: She continues to see her primary care provider regularly.     She knows to call for any questions or concerns before her next visit with [place] which will be in February 2025 for labs, follow-up, and Prolia. (Receiving prolia in August with her PCP)

## 2022-10-09 MED ORDER — ROSUVASTATIN CALCIUM 10 MG PO TABS: 10.0000 mg | ORAL_TABLET | ORAL | 3 refills | Status: DC

## 2022-10-10 ENCOUNTER — Telehealth: Payer: Self-pay | Admitting: Adult Health

## 2022-10-10 NOTE — Telephone Encounter (Signed)
Scheduled appointments per 6/20 los. Left voicemail.

## 2022-10-15 ENCOUNTER — Encounter: Payer: Self-pay | Admitting: Adult Health

## 2022-10-15 ENCOUNTER — Telehealth: Payer: Self-pay

## 2022-10-15 DIAGNOSIS — Z170 Estrogen receptor positive status [ER+]: Secondary | ICD-10-CM | POA: Diagnosis not present

## 2022-10-15 DIAGNOSIS — C50211 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 female breast: Secondary | ICD-10-CM | POA: Diagnosis not present

## 2022-10-15 NOTE — Telephone Encounter (Signed)
Called pt regarding MyChart message. We spoke extensively about Signatera testing, benefits, what negative, not detected, or positive means. Explained to pt if she has a positive result she will have a CT CAP and NM bone scan. She knows to call with any further questions or concerns.,

## 2022-10-19 ENCOUNTER — Other Ambulatory Visit (HOSPITAL_COMMUNITY): Payer: Self-pay

## 2022-10-19 ENCOUNTER — Other Ambulatory Visit: Payer: Self-pay | Admitting: Hematology and Oncology

## 2022-10-19 ENCOUNTER — Telehealth: Payer: Self-pay | Admitting: *Deleted

## 2022-10-19 NOTE — Telephone Encounter (Signed)
Ok to schedule.

## 2022-10-19 NOTE — Telephone Encounter (Signed)
Pt called inquiring about the out of pocket cost for her prolia. Per chart review and last documentation from the PA team, her out of pocket cost is $300. Informed her and she said that her Oncology recommended another medication started with "Z" and she was told that that's what her insurance covers. She wants to know if that's something we do here and if I know if it will be covered. Advised pt to call her insurance and ask them about the cost/coverage for that medication. She voiced understanding and agreed to call insurance.

## 2022-10-19 NOTE — Telephone Encounter (Signed)
Patient already scheduled for prolia on 8/1 @ 10 am. Pt is aware of out of pocket cost of $296.

## 2022-10-19 NOTE — Telephone Encounter (Signed)
Pt ready for scheduling for Prolia on or after : 11/19/22  Out-of-pocket cost due at time of visit: $296  Primary: [employer] - Medicare Prolia co-insurance: 20% Admin fee co-insurance: $20  Secondary: N/A Prolia co-insurance:  Admin fee co-insurance:   Medical Benefit Details: Date Benefits were checked: 10/19/22 Deductible: no/ Coinsurance: 20%/ Admin Fee: $20  Prior Auth: Approved PA# [id number] Expiration Date: 10/19/22 to 10/19/23   Pharmacy benefit: Copay $300 If patient wants fill through the pharmacy benefit please send prescription to:  [facility] , and include estimated need by date in rx notes. Pharmacy will ship medication directly to the office.  Patient not eligible for Prolia Copay Card. Copay Card can make patient's cost as little as $25. Link to apply: [email or web address]://www.amgensupportplus.[email or web address]/copay  ** This summary of benefits is an estimation of the patient's out-of-pocket cost. Exact cost may very based on individual plan coverage.

## 2022-10-20 ENCOUNTER — Telehealth: Payer: Self-pay | Admitting: Adult Health

## 2022-10-20 NOTE — Telephone Encounter (Signed)
Rescheduled appointment per staff message. Patient is aware.

## 2022-10-27 ENCOUNTER — Other Ambulatory Visit: Payer: Self-pay | Admitting: Adult Health

## 2022-11-09 LAB — SIGNATERA
SIGNATERA MTM READOUT: 0 MTM/ml
SIGNATERA TEST RESULT: NEGATIVE

## 2022-11-18 ENCOUNTER — Telehealth: Payer: Self-pay

## 2022-11-18 NOTE — Telephone Encounter (Signed)
Called pt per MD to advise Signatera testing was negative/not detected. Pt verbalized understanding of results and knows [employer]atera will be in touch to schedule 3 mo repeat lab.   

## 2022-11-19 ENCOUNTER — Ambulatory Visit: Payer: Medicare Other

## 2022-11-19 DIAGNOSIS — M810 Age-related osteoporosis without current pathological fracture: Secondary | ICD-10-CM

## 2022-11-19 MED ORDER — DENOSUMAB 60 MG/ML ~~LOC~~ SOSY
60.0000 mg | PREFILLED_SYRINGE | Freq: Once | SUBCUTANEOUS | Status: AC
  Administered 2022-11-19: 60 mg via SUBCUTANEOUS

## 2022-11-25 NOTE — Progress Notes (Signed)
Pt tolerated Prolia injection well.

## 2022-12-08 ENCOUNTER — Encounter: Payer: Self-pay | Admitting: Adult Health

## 2022-12-14 ENCOUNTER — Ambulatory Visit
Admission: RE | Admit: 2022-12-14 | Discharge: 2022-12-14 | Disposition: A | Discharge status: Home / Self Care | Payer: Medicare Other | Source: Ambulatory Visit | Attending: Adult Health | Admitting: Adult Health

## 2022-12-14 DIAGNOSIS — Z1231 Encounter for screening mammogram for malignant neoplasm of breast: Secondary | ICD-10-CM | POA: Diagnosis not present

## 2023-01-20 DIAGNOSIS — Z170 Estrogen receptor positive status [ER+]: Secondary | ICD-10-CM | POA: Diagnosis not present

## 2023-01-20 DIAGNOSIS — C50211 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 female breast: Secondary | ICD-10-CM | POA: Diagnosis not present

## 2023-01-21 DIAGNOSIS — H40053 Ocular hypertension, bilateral: Secondary | ICD-10-CM | POA: Diagnosis not present

## 2023-01-22 ENCOUNTER — Other Ambulatory Visit: Payer: Self-pay | Admitting: Hematology and Oncology

## 2023-02-03 ENCOUNTER — Telehealth: Payer: Self-pay

## 2023-02-03 ENCOUNTER — Other Ambulatory Visit: Payer: Self-pay | Admitting: Hematology and Oncology

## 2023-02-03 NOTE — Telephone Encounter (Signed)
Called pt per MD to advise Signatera testing was negative/not detected. Pt verbalized understanding of results and knows [employer]atera will be in touch to schedule 3 mo repeat lab.   

## 2023-02-04 ENCOUNTER — Encounter: Payer: Self-pay | Admitting: Hematology and Oncology

## 2023-02-18 ENCOUNTER — Encounter: Payer: Self-pay | Admitting: Adult Health

## 2023-02-19 ENCOUNTER — Other Ambulatory Visit: Payer: Self-pay | Admitting: Hematology and Oncology

## 2023-02-22 ENCOUNTER — Other Ambulatory Visit: Payer: Self-pay

## 2023-02-22 ENCOUNTER — Telehealth: Payer: Self-pay

## 2023-02-22 MED ORDER — ZOLPIDEM TARTRATE 1.75 MG SL SUBL: SUBLINGUAL_TABLET | SUBLINGUAL | 0 refills | Status: DC

## 2023-02-22 NOTE — Telephone Encounter (Signed)
Pt called regarding Zolpidem taper per MD conversation via MyChart.  Per MD, RX called in to [facility] with taper. Pt prefers 3.5 mg daily X 1 week 1.75 mg daily X 1 week 1.75 mg every other day X 1 week.   She understands instructions and knows to call with any further questions.

## 2023-02-23 ENCOUNTER — Other Ambulatory Visit: Payer: Self-pay | Admitting: *Deleted

## 2023-02-23 MED ORDER — ZOLPIDEM TARTRATE 5 MG PO TABS: 2.5000 mg | ORAL_TABLET | Freq: Every evening | ORAL | 0 refills | Status: DC | PRN

## 2023-02-23 MED ORDER — ZOLPIDEM TARTRATE 5 MG PO TABS: 5.0000 mg | ORAL_TABLET | Freq: Every evening | ORAL | 0 refills | Status: DC | PRN

## 2023-02-23 NOTE — Progress Notes (Signed)
Received call from pt stating the pharmacy has notified her that Ambien 1.75 mg tablet would be $300 a month and that pt can cut a 5 mg p.o tablet in half since it is not an extended release medication.  RN reviewed with MD and verbal orders received to proceed with Ambien 5 mg.

## 2023-02-23 NOTE — Progress Notes (Signed)
Prior authorization approval for Zolpidem 1.75 mg Sublingual tablets obtained. Medication is approved through 04/19/2024

## 2023-03-24 ENCOUNTER — Encounter: Payer: Self-pay | Admitting: Adult Health

## 2023-03-26 ENCOUNTER — Other Ambulatory Visit: Payer: Self-pay | Admitting: Hematology and Oncology

## 2023-03-26 MED ORDER — ZOLPIDEM TARTRATE 5 MG PO TABS: 2.5000 mg | ORAL_TABLET | Freq: Every evening | ORAL | 0 refills | Status: DC | PRN

## 2023-04-29 DIAGNOSIS — Z170 Estrogen receptor positive status [ER+]: Secondary | ICD-10-CM | POA: Diagnosis not present

## 2023-04-29 DIAGNOSIS — C50211 Malignant neoplasm of upper-inner quadrant of right female breast: Secondary | ICD-10-CM | POA: Diagnosis not present

## 2023-05-05 LAB — SIGNATERA
SIGNATERA MTM READOUT: 0 MTM/ml
SIGNATERA TEST RESULT: NEGATIVE

## 2023-05-10 ENCOUNTER — Encounter (HOSPITAL_COMMUNITY): Payer: Self-pay

## 2023-05-24 ENCOUNTER — Ambulatory Visit: Payer: Medicare Other

## 2023-05-24 ENCOUNTER — Inpatient Hospital Stay: Payer: Medicare Other | Attending: Adult Health

## 2023-05-24 ENCOUNTER — Inpatient Hospital Stay (HOSPITAL_BASED_OUTPATIENT_CLINIC_OR_DEPARTMENT_OTHER): Payer: Medicare Other | Admitting: Adult Health

## 2023-05-24 ENCOUNTER — Inpatient Hospital Stay: Payer: Medicare Other

## 2023-05-24 ENCOUNTER — Encounter: Payer: Self-pay | Admitting: Adult Health

## 2023-05-24 VITALS — BP 143/81 | HR 94 | Temp 98.6°F | Resp 20 | Ht 63.0 in | Wt 155.4 lb

## 2023-05-24 DIAGNOSIS — M810 Age-related osteoporosis without current pathological fracture: Secondary | ICD-10-CM | POA: Insufficient documentation

## 2023-05-24 DIAGNOSIS — Z803 Family history of malignant neoplasm of breast: Secondary | ICD-10-CM | POA: Diagnosis not present

## 2023-05-24 DIAGNOSIS — E559 Vitamin D deficiency, unspecified: Secondary | ICD-10-CM

## 2023-05-24 DIAGNOSIS — Z1239 Encounter for other screening for malignant neoplasm of breast: Secondary | ICD-10-CM

## 2023-05-24 DIAGNOSIS — M816 Localized osteoporosis [Lequesne]: Secondary | ICD-10-CM

## 2023-05-24 DIAGNOSIS — C50211 Malignant neoplasm of upper-inner quadrant of right female breast: Secondary | ICD-10-CM

## 2023-05-24 DIAGNOSIS — Z170 Estrogen receptor positive status [ER+]: Secondary | ICD-10-CM | POA: Diagnosis not present

## 2023-05-24 LAB — CBC WITH DIFFERENTIAL (CANCER CENTER ONLY)
Abs Immature Granulocytes: 0.02 10*3/uL (ref 0.00–0.07)
Basophils Absolute: 0 10*3/uL (ref 0.0–0.1)
Basophils Relative: 0 %
Eosinophils Absolute: 0.3 10*3/uL (ref 0.0–0.5)
Eosinophils Relative: 3 %
HCT: 42.2 % (ref 36.0–46.0)
Hemoglobin: 14.2 g/dL (ref 12.0–15.0)
Immature Granulocytes: 0 %
Lymphocytes Relative: 10 %
Lymphs Abs: 0.9 10*3/uL (ref 0.7–4.0)
MCH: 29.7 pg (ref 26.0–34.0)
MCHC: 33.6 g/dL (ref 30.0–36.0)
MCV: 88.3 fL (ref 80.0–100.0)
Monocytes Absolute: 0.5 10*3/uL (ref 0.1–1.0)
Monocytes Relative: 6 %
Neutro Abs: 6.8 10*3/uL (ref 1.7–7.7)
Neutrophils Relative %: 81 %
Platelet Count: 261 10*3/uL (ref 150–400)
RBC: 4.78 MIL/uL (ref 3.87–5.11)
RDW: 12.7 % (ref 11.5–15.5)
WBC Count: 8.5 10*3/uL (ref 4.0–10.5)
nRBC: 0 % (ref 0.0–0.2)

## 2023-05-24 LAB — CMP (CANCER CENTER ONLY)
ALT: 14 U/L (ref 0–44)
AST: 18 U/L (ref 15–41)
Albumin: 4.7 g/dL (ref 3.5–5.0)
Alkaline Phosphatase: 55 U/L (ref 38–126)
Anion gap: 8 (ref 5–15)
BUN: 26 mg/dL — ABNORMAL HIGH (ref 8–23)
CO2: 28 mmol/L (ref 22–32)
Calcium: 10.4 mg/dL — ABNORMAL HIGH (ref 8.9–10.3)
Chloride: 101 mmol/L (ref 98–111)
Creatinine: 0.82 mg/dL (ref 0.44–1.00)
GFR, Estimated: >60 mL/min (ref >60)
Glucose, Bld: 107 mg/dL — ABNORMAL HIGH (ref 70–99)
Potassium: 3.8 mmol/L (ref 3.5–5.1)
Sodium: 137 mmol/L (ref 135–145)
Total Bilirubin: 0.4 mg/dL (ref 0.0–1.2)
Total Protein: 7.2 g/dL (ref 6.5–8.1)

## 2023-05-24 LAB — VITAMIN D 25 HYDROXY (VIT D DEFICIENCY, FRACTURES): Vit D, 25-Hydroxy: 26.93 ng/mL — ABNORMAL LOW (ref 30–100)

## 2023-05-24 MED ORDER — DENOSUMAB 60 MG/ML ~~LOC~~ SOSY
60.0000 mg | PREFILLED_SYRINGE | Freq: Once | SUBCUTANEOUS | Status: AC
Start: 2023-05-24 — End: 2023-05-24
  Administered 2023-05-24: 60 mg via SUBCUTANEOUS
  Filled 2023-05-24: qty 1

## 2023-05-24 NOTE — Progress Notes (Signed)
[facility] Cancer Follow up:    [name], MD [address] [place] [address]   DIAGNOSIS:  Cancer Staging  Breast cancer of upper-inner quadrant of right female breast [facility]) Staging form: Breast, AJCC 8th Edition - Clinical stage from 11/01/2018: Stage IA (rcT1c, cN0, cM0, G2, ER+, PR-, HER2-) - Unsigned Stage prefix: Recurrence Histologic grading system: 3 grade system - Pathologic stage from 11/30/2018: Stage IIA (pT2, pN0, cM0, G3, ER+, PR-, HER2-) - Signed by Loa Socks, NP on 12/14/2018 Stage prefix: Initial diagnosis Histologic grading system: 3 grade system   SUMMARY OF ONCOLOGIC HISTORY: Oncology History  Breast cancer of upper-inner quadrant of right female breast (HCC)  12/05/2015 Mammogram   Right breast mass 2:00 4 cm from nipple: 1.7 cm, T1c N0 stage IA; right breast 2:00 6 cm from nipple 1.3 cm mass, 7 mm satellite nodule between the 2 masses, T1 cN0 stage IA   12/06/2015 Initial Diagnosis   Right breast biopsy 2:00 6 cm from nipple: Grade 2-3 invasive ductal cancer, ER 90%, PR 2%, Ki-67 80%, HER-2 positive ratio 2.07; right biopsy 2:00 4 cm from nipple: IDC with DCIS, ER 95%, PR 10%, Ki-67 90%, HER-2 negative ratio 1.27   12/18/2015 Breast MRI   3 enhancing masses in the upper inner quadrant right breast spanning an area 3.7 cm (1.7 cm, 1 cm, 1.4 cm) no lymph node enlargement   12/31/2015 - 04/15/2016 Neo-Adjuvant Chemotherapy   TCH Perjeta 6 cycles followed by Herceptin, Perjeta maintenance for 1 year completed 10/20/2016   01/08/2016 - 01/12/2016 Hospital Admission   Neutropenic fever hospitalization (because patient did not receive Neulasta with cycle 1)   01/10/2016 Miscellaneous   Genetic testing was normal did not reveal any mutations   04/16/2016 Breast MRI   Near CR to therapy. Previous 2 biopsied massses are no longer seen. Third satellite lesion is smaller from 10 mm to 5.3 mm, no abnormal LN.   05/19/2016 Surgery    Right mastectomy: IDC grade 3, 1.3 cm, low-grade DCIS, LVIDS present, margins negative, 0/1 lymph node negative, ypT1CypN0 stage IA; repeat HER-2 negative ratio 1.41   05/19/2016 Surgery   Right breast reconstruction with tissue expander. Acellular dermis for breast reconstruction (Dr.Thimappa)    06/22/2016 - 10/2018 Anti-estrogen oral therapy   Letrozole 2.5 mg daily   11/01/2018 Relapse/Recurrence   Patient palpated lump in the UIQ at surgical scar of reconstructed right breast. US of the right breast showed a 1.8cm mass suspicious for recurrent breast cancer. Biopsy showed grade 2 invasive ductal carcinoma, HER-2 negative (1+), ER 90%, PR negative, Ki67 40%.   11/11/2018 Oncotype testing   Oncotype score 52: Greater than 39% risk of distant recurrence with hormone therapy alone, chemo benefit greater than 15%   11/30/2018 Surgery   Right lumpectomy Dwain Sarna): IDC, grade 3, 2.1cm, perineural invasion present, carcinoma present focally at the posterior margin and involving skeletal muscle.   11/30/2018 Cancer Staging   Staging form: Breast, AJCC 8th Edition - Pathologic stage from 11/30/2018: Stage IIA (pT2, pN0, cM0, G3, ER+, PR-, HER2-) - Signed by Loa Socks, NP on 12/14/2018   12/23/2018 - 04/07/2019 Chemotherapy   Taxotere and Cytoxan x6 cycles   05/18/2019 - 06/29/2019 Radiation Therapy   Adjuvant radiation   07/2019 -  Anti-estrogen oral therapy   Anastrozole daily   09/2019 - 09/2021 Chemotherapy   Abemaciclib 300 mg daily started, decrease to 150 mg daily 10/15/2019 due to abdominal pain  CURRENT THERAPY: Anastrozole/Prolia  INTERVAL HISTORY:  Discussed the use of AI scribe software for clinical note transcription with the patient, who gave verbal consent to proceed.  Patricia Chandler 68 y.o. female returns for follow-up of her history of breast cancer.  She continues on anastrozole with good tolerance.  Her most recent mammogram in August showed no  evidence of malignancy and breast density category C.  She continues to receive Prolia every 6 months and tolerates this well.  Most recent bone density testing occurred in January 2024 and showed continued osteopenia with a T-score -2.0 in the left femoral neck.  Her bone density testing in 2018 showed osteoporosis with a T-score -2.7 in the right femoral neck and osteopenia in May 2021 with a T-score -2.1 in the right femoral neck.  She is also due for breast MRI later this month.  She is considered to be at high risk due to her family history of breast cancer and her breast cancer recurrence that was mammographically occult.   Patient Active Problem List   Diagnosis Date Noted   Aortic atherosclerosis (HCC) 09/25/2022   AC (acromioclavicular) arthritis 06/17/2022   Encounter for counseling 12/02/2021   Acquired absence of nipple 09/16/2021   Osteoporosis 05/07/2020   Snoring 01/23/2020   Insomnia 01/08/2020   Liver lesion 06/21/2018   Intra-abdominal abscess (HCC) 06/14/2018   Acute diverticulitis 06/14/2018   Acute bursitis of right shoulder 06/08/2017   Acute shoulder bursitis, left 03/08/2017   DDD (degenerative disc disease), cervical 10/08/2016   Hypokalemia 03/30/2016   Chemotherapy-induced thrombocytopenia 03/30/2016   Genetic testing 01/10/2016   History of sepsis 01/08/2016   GERD (gastroesophageal reflux disease) 01/08/2016   Breast cancer of upper-inner quadrant of right female breast (HCC) 12/18/2015   Family history of breast cancer    Internal hemorrhoid 01/26/2011    has no known allergies.  MEDICAL HISTORY: Past Medical History:  Diagnosis Date   Anemia    due to chemo   Anxiety    Breast cancer (HCC) dx'd 2017   right   Cardiomyopathy (HCC)    mild, per cardiology note; due to Herceptin   Dental crowns present    Family history of adverse reaction to anesthesia    pt's daughter has hx. of post-op nausea   GERD (gastroesophageal reflux disease)     Heart murmur    History of breast cancer 2017   right   History of chemotherapy    finished chemo 04/15/2016   Personal history of chemotherapy    Personal history of radiation therapy    Right Breast Cancer   Port catheter in place 01/08/2016   Ongoing herceptin and projeta until September 2018     Rash 08/04/2016   under right arm - states recent tick bite; to see MD 08/05/2016   Recurrent breast cancer, right (HCC) dx'd 10/2018   Runny nose 06/06/2016   clear drainage, per pt.    SURGICAL HISTORY: Past Surgical History:  Procedure Laterality Date   AUGMENTATION MAMMAPLASTY Left    BREAST BIOPSY Right 11/30/2018   Procedure: RIGHT BREAST CHEST WALL RECURRENCE EXCISION;  Surgeon: Emelia Loron, MD;  Location: [facility] OR;  Service: General;  Laterality: Right;   BREAST ENHANCEMENT SURGERY Bilateral 2005   breast mass removal Right 11/30/2018   BREAST RECONSTRUCTION WITH PLACEMENT OF TISSUE EXPANDER AND FLEX HD (ACELLULAR HYDRATED DERMIS) Right 05/19/2016   Procedure: RIGHT BREAST RECONSTRUCTION WITH PLACEMENT OF TISSUE EXPANDER AND  ALLODERM.;  Surgeon: Glenna Fellows, MD;  Location: [facility];  Service: Plastics;  Laterality: Right;   COLONOSCOPY     COLONOSCOPY WITH PROPOFOL  10/02/2014   DEBRIDEMENT AND CLOSURE WOUND Right 06/08/2016   Procedure: DEBRIDEMENT RIGHT MASTECTOMY FLAP, TISSUE EXPANSION OF RIGHT CHEST;  Surgeon: Glenna Fellows, MD;  Location: [facility] OR;  Service: Plastics;  Laterality: Right;   MASTECTOMY Right    MASTOPEXY Left 08/11/2016   Procedure: LEFT BREAST MASTOPEXY;  Surgeon: Glenna Fellows, MD;  Location: [facility];  Service: Plastics;  Laterality: Left;   NIPPLE SPARING MASTECTOMY/SENTINAL LYMPH NODE BIOPSY/RECONSTRUCTION/PLACEMENT OF TISSUE EXPANDER Right 05/19/2016   Procedure: RIGHT NIPPLE SPARING MASTECTOMY WITH SENTINAL LYMPH NODE BIOPSY WITH BLUE DYE INJECTION;  Surgeon: Emelia Loron, MD;  Location: [facility];  Service: General;  Laterality: Right;   PORT-A-CATH REMOVAL Right 04/26/2019   Procedure: REMOVAL PORT-A-CATH;  Surgeon: Emelia Loron, MD;  Location: [facility];  Service: General;  Laterality: Right;   PORTACATH PLACEMENT N/A 12/30/2015   Procedure: INSERTION PORT-A-CATH WITH [place];  Surgeon: Emelia Loron, MD;  Location: [facility];  Service: General;  Laterality: N/A;   PORTACATH PLACEMENT N/A 11/30/2018   Procedure: INSERTION PORT-A-CATH WITH ULTRASOUND;  Surgeon: Emelia Loron, MD;  Location: [facility] OR;  Service: General;  Laterality: N/A;   REDUCTION MAMMAPLASTY     REMOVAL OF TISSUE EXPANDER AND PLACEMENT OF IMPLANT Right 08/11/2016   Procedure: REMOVAL OF RIGHT  TISSUE EXPANDER AND PLACEMENT OF IMPLANT;  Surgeon: Glenna Fellows, MD;  Location: [facility];  Service: Plastics;  Laterality: Right;    SOCIAL HISTORY: Social History   Socioeconomic History   Marital status: Married    Spouse name: Not on file   Number of children: 2   Years of education: Not on file   Highest education level: Not on file  Occupational History   Occupation: retired  Tobacco Use   Smoking status: Never   Smokeless tobacco: Never  Vaping Use   Vaping status: Never Used  Substance and Sexual Activity   Alcohol use: Yes    Alcohol/week: 3.0 standard drinks of alcohol    Types: 3 Glasses of wine per week    Comment: occasionally   Drug use: No   Sexual activity: Not on file  Other Topics Concern   Not on file  Social History Narrative   Married 1978 (Al). 2 girls 33 and 36 in 2018. Lanora Manis (liz) lives in [place], [place] with 33 yo son (single right now). and [name] in [place], [place]--> now [place] with husband and 79 year old daughter (born with heart defect with missing valve and initially not closed VSD--still weighs heavily on family- likely will have to be repaired).       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    Graduated from [employer]. [employer] prior.       Hobbies: gardening, decorating, sewing, enjoys creating   Social Drivers of Health   Financial Resource Strain: Low Risk  (10/05/2022)   Overall Financial Resource Strain (CARDIA)    Difficulty of Paying Living Expenses: Not hard at all  Food Insecurity: No Food Insecurity (10/05/2022)   Hunger Vital Sign    Worried About Running Out of Food in the Last Year: Never true    Ran Out of Food in the Last Year: Never true  Transportation Needs: No Transportation Needs (10/05/2022)   PRAPARE -  Administrator, Civil Service (Medical): No    Lack of Transportation (Non-Medical): No  Physical Activity: Sufficiently Active (10/05/2022)   Exercise Vital Sign    Days of Exercise per Week: 6 days    Minutes of Exercise per Session: 50 min  Stress: No Stress Concern Present (10/05/2022)   [employer] of Occupational Health - Occupational Stress Questionnaire    Feeling of Stress : Not at all  Social Connections: Moderately Integrated (10/05/2022)   Social Connection and Isolation Panel [NHANES]    Frequency of Communication with Friends and Family: Twice a week    Frequency of Social Gatherings with Friends and Family: Three times a week    Attends Religious Services: Never    Active Member of Clubs or Organizations: Yes    Attends Banker Meetings: More than 4 times per year    Marital Status: Married  Catering manager Violence: Not At Risk (10/06/2022)   Humiliation, Afraid, Rape, and Kick questionnaire    Fear of Current or Ex-Partner: No    Emotionally Abused: No    Physically Abused: No    Sexually Abused: No    FAMILY HISTORY: Family History  Problem Relation Age of Onset   Breast cancer Maternal Aunt        dx in her 77s   Cancer Maternal Aunt    Stroke Father        late 23s   Hypertension Father    Pulmonary fibrosis Mother    Vision loss Mother     Breast cancer Paternal Aunt 99   Cancer Paternal Grandmother        abdominal; cervical vs uterine dx in her 56s   Stomach cancer Maternal Grandmother    Parkinson's disease Maternal Grandfather    Anesthesia problems Daughter        post-op nausea   Colon cancer Neg Hx    Esophageal cancer Neg Hx    Rectal cancer Neg Hx     Review of Systems  Constitutional:  Negative for appetite change, chills, fatigue, fever and unexpected weight change.  HENT:   Negative for hearing loss, lump/mass and trouble swallowing.   Eyes:  Negative for eye problems and icterus.  Respiratory:  Negative for chest tightness, cough and shortness of breath.   Cardiovascular:  Negative for chest pain, leg swelling and palpitations.  Gastrointestinal:  Negative for abdominal distention, abdominal pain, constipation, diarrhea, nausea and vomiting.  Endocrine: Negative for hot flashes.  Genitourinary:  Negative for difficulty urinating.   Musculoskeletal:  Negative for arthralgias.  Skin:  Negative for itching and rash.  Neurological:  Negative for dizziness, extremity weakness, headaches and numbness.  Hematological:  Negative for adenopathy. Does not bruise/bleed easily.  Psychiatric/Behavioral:  Negative for depression. The patient is not nervous/anxious.       PHYSICAL EXAMINATION    Vitals:   05/24/23 1331  BP: (!) 143/81  Pulse: 94  Resp: 20  Temp: 98.6 F (37 C)  SpO2: 100%    Physical Exam Constitutional:      General: She is not in acute distress.    Appearance: Normal appearance. She is not toxic-appearing.  HENT:     Head: Normocephalic and atraumatic.     Mouth/Throat:     Mouth: Mucous membranes are moist.     Pharynx: Oropharynx is clear. No oropharyngeal exudate or posterior oropharyngeal erythema.  Eyes:     General: No scleral icterus. Cardiovascular:     Rate and  Rhythm: Normal rate and regular rhythm.     Pulses: Normal pulses.     Heart sounds: Normal heart sounds.   Pulmonary:     Effort: Pulmonary effort is normal.     Breath sounds: Normal breath sounds.  Chest:     Comments: Right breast status postmastectomy, radiation, and reconstruction no sign of local recurrence, left breast benign Abdominal:     General: Abdomen is flat. Bowel sounds are normal. There is no distension.     Palpations: Abdomen is soft.     Tenderness: There is no abdominal tenderness.  Musculoskeletal:        General: No swelling.     Cervical back: Neck supple.  Lymphadenopathy:     Cervical: No cervical adenopathy.     Upper Body:     Right upper body: No axillary adenopathy.     Left upper body: No axillary adenopathy.  Skin:    General: Skin is warm and dry.     Findings: No rash.  Neurological:     General: No focal deficit present.     Mental Status: She is alert.  Psychiatric:        Mood and Affect: Mood normal.        Behavior: Behavior normal.     LABORATORY DATA:  CBC    Component Value Date/Time   WBC 8.5 05/24/2023 1247   WBC 9.6 11/18/2018 0940   RBC 4.78 05/24/2023 1247   HGB 14.2 05/24/2023 1247   HGB 11.3 (L) 10/20/2016 0757   HCT 42.2 05/24/2023 1247   HCT 32.9 (L) 10/20/2016 0757   PLT 261 05/24/2023 1247   PLT 186 10/20/2016 0757   MCV 88.3 05/24/2023 1247   MCV 89.2 10/20/2016 0757   MCH 29.7 05/24/2023 1247   MCHC 33.6 05/24/2023 1247   RDW 12.7 05/24/2023 1247   RDW 14.2 10/20/2016 0757   LYMPHSABS 0.9 05/24/2023 1247   LYMPHSABS 2.0 10/20/2016 0757   MONOABS 0.5 05/24/2023 1247   MONOABS 0.4 10/20/2016 0757   EOSABS 0.3 05/24/2023 1247   EOSABS 0.1 10/20/2016 0757   BASOSABS 0.0 05/24/2023 1247   BASOSABS 0.0 10/20/2016 0757    CMP     Component Value Date/Time   NA 137 05/24/2023 1247   NA 140 10/20/2016 0757   K 3.8 05/24/2023 1247   K 3.9 10/20/2016 0757   CL 101 05/24/2023 1247   CO2 28 05/24/2023 1247   CO2 24 10/20/2016 0757   GLUCOSE 107 (H) 05/24/2023 1247   GLUCOSE 111 10/20/2016 0757   BUN 26 (H)  05/24/2023 1247   BUN 22.7 10/20/2016 0757   CREATININE 0.82 05/24/2023 1247   CREATININE 0.8 10/20/2016 0757   CALCIUM 10.4 (H) 05/24/2023 1247   CALCIUM 9.3 10/20/2016 0757   PROT 7.2 05/24/2023 1247   PROT 5.9 (L) 10/20/2016 0757   ALBUMIN 4.7 05/24/2023 1247   ALBUMIN 3.6 10/20/2016 0757   AST 18 05/24/2023 1247   AST 16 10/20/2016 0757   ALT 14 05/24/2023 1247   ALT 16 10/20/2016 0757   ALKPHOS 55 05/24/2023 1247   ALKPHOS 54 10/20/2016 0757   BILITOT 0.4 05/24/2023 1247   BILITOT 0.39 10/20/2016 0757   GFRNONAA >60 05/24/2023 1247   GFRAA >60 01/22/2020 1528         ASSESSMENT and THERAPY PLAN:   Breast cancer of upper-inner quadrant of right female breast (HCC) Alexia Freestone is a 68 year old woman with recurrent right breast cancer who continues on anastrozole  daily.  Breast Cancer In remission, on Anastrozole with no reported side effects. Mammogram in August 2024 was negative. -Continue Anastrozole. -Order breast MRI for end of February 2025. -Plan for mammogram in August 2025.  Osteoporosis On Prolia, insurance approval confirmed. Calcium and Vitamin D levels pending to ensure adequate supplementation for bone health. -Administer Prolia injection today. -Check Calcium and Vitamin D levels. -Follow-up in 6 months for labs and next Prolia injection. -Bone density testing will be due to be repeated in January 2026.  She knows to call for any questions or concerns before her next visit with [place] which will be in 11/2023 for labs, f/u, and Prolia  All questions were answered. The patient knows to call the clinic with any problems, questions or concerns. We can certainly see the patient much sooner if necessary.  Total encounter time:30 minutes*in face-to-face visit time, chart review, lab review, care coordination, order entry, and documentation of the encounter time.    Lillard Anes, NP 05/24/23 2:13 PM Medical Oncology and Hematology [facility] [address] [place], [place] [address] Tel. [phone]    Fax. [phone]  *Total Encounter Time as defined by the Centers for Medicare and Medicaid Services includes, in addition to the face-to-face time of a patient visit (documented in the note above) non-face-to-face time: obtaining and reviewing outside history, ordering and reviewing medications, tests or procedures, care coordination (communications with other health care professionals or caregivers) and documentation in the medical record.

## 2023-05-24 NOTE — Assessment & Plan Note (Addendum)
Patricia Chandler is a 68 year old woman with recurrent right breast cancer who continues on anastrozole daily.  Breast Cancer In remission, on Anastrozole with no reported side effects. Mammogram in August 2024 was negative. -Continue Anastrozole. -Order breast MRI for end of February 2025. -Plan for mammogram in August 2025.  Osteoporosis On Prolia, insurance approval confirmed. Calcium and Vitamin D levels pending to ensure adequate supplementation for bone health. -Administer Prolia injection today. -Check Calcium and Vitamin D levels. -Follow-up in 6 months for labs and next Prolia injection. -Bone density testing will be due to be repeated in January 2026.  She knows to call for any questions or concerns before her next visit with [place] which will be in 11/2023 for labs, f/u, and Prolia

## 2023-05-25 ENCOUNTER — Telehealth: Payer: Self-pay

## 2023-05-25 NOTE — Telephone Encounter (Addendum)
 Called pt per Np message below. Pt states that she was taking 20 mcg of vitamin D . Advise her to take 1000 mcg to get levels up. Pt verbalized understanding.----- Message from Morna JAYSON Kendall sent at 05/25/2023  6:54 AM EST ----- Please let patti know that her vitamin d  level is low.  Will you verify whether she taking an OTC vitamin D  supplement? ----- Message ----- From: Interface, Lab In [place] Sent: 05/24/2023   1:29 PM EST To: Morna Dalton Kendall, NP

## 2023-06-06 ENCOUNTER — Other Ambulatory Visit: Payer: Self-pay | Admitting: Hematology and Oncology

## 2023-06-07 MED ORDER — ZOLPIDEM TARTRATE 5 MG PO TABS: 2.5000 mg | ORAL_TABLET | Freq: Every evening | ORAL | 0 refills | Status: DC | PRN

## 2023-06-14 ENCOUNTER — Encounter (INDEPENDENT_AMBULATORY_CARE_PROVIDER_SITE_OTHER): Payer: Self-pay

## 2023-06-21 ENCOUNTER — Encounter (INDEPENDENT_AMBULATORY_CARE_PROVIDER_SITE_OTHER): Payer: Self-pay

## 2023-06-27 ENCOUNTER — Ambulatory Visit
Admission: RE | Admit: 2023-06-27 | Discharge: 2023-06-27 | Disposition: A | Discharge status: Home / Self Care | Payer: Medicare Other | Source: Ambulatory Visit | Attending: Adult Health | Admitting: Adult Health

## 2023-06-27 DIAGNOSIS — Z803 Family history of malignant neoplasm of breast: Secondary | ICD-10-CM

## 2023-06-27 DIAGNOSIS — Z853 Personal history of malignant neoplasm of breast: Secondary | ICD-10-CM | POA: Diagnosis not present

## 2023-06-27 DIAGNOSIS — C50211 Malignant neoplasm of upper-inner quadrant of right female breast: Secondary | ICD-10-CM

## 2023-06-27 DIAGNOSIS — Z1239 Encounter for other screening for malignant neoplasm of breast: Secondary | ICD-10-CM | POA: Diagnosis not present

## 2023-06-27 DIAGNOSIS — N644 Mastodynia: Secondary | ICD-10-CM | POA: Diagnosis not present

## 2023-06-27 MED ORDER — GADOPICLENOL 0.5 MMOL/ML IV SOLN
7.0000 mL | Freq: Once | INTRAVENOUS | Status: AC | PRN
  Administered 2023-06-27: 7 mL via INTRAVENOUS

## 2023-06-28 ENCOUNTER — Telehealth: Payer: Self-pay | Admitting: *Deleted

## 2023-06-28 ENCOUNTER — Encounter (INDEPENDENT_AMBULATORY_CARE_PROVIDER_SITE_OTHER): Payer: Self-pay

## 2023-06-28 NOTE — Telephone Encounter (Signed)
Per Wilber Bihari, DNP, called pt with message below. Pt was very appreciative and verbalized understanding

## 2023-06-28 NOTE — Telephone Encounter (Signed)
-----   Message from Noreene Filbert sent at 06/28/2023  1:04 PM EDT ----- MRI looks good.  Please let patient know. ----- Message ----- From: Interface, Rad Results In Sent: 06/28/2023  10:34 AM EDT To: Loa Socks, NP

## 2023-07-21 DIAGNOSIS — Z170 Estrogen receptor positive status [ER+]: Secondary | ICD-10-CM | POA: Diagnosis not present

## 2023-07-21 DIAGNOSIS — C50211 Malignant neoplasm of upper-inner quadrant of right female breast: Secondary | ICD-10-CM | POA: Diagnosis not present

## 2023-07-22 DIAGNOSIS — H40053 Ocular hypertension, bilateral: Secondary | ICD-10-CM | POA: Diagnosis not present

## 2023-08-02 ENCOUNTER — Encounter (INDEPENDENT_AMBULATORY_CARE_PROVIDER_SITE_OTHER): Payer: Self-pay

## 2023-08-06 ENCOUNTER — Telehealth: Payer: Self-pay

## 2023-08-06 ENCOUNTER — Encounter: Payer: Self-pay | Admitting: Hematology and Oncology

## 2023-08-06 NOTE — Telephone Encounter (Signed)
 Attempted to call pt regarding signatera results lvm for pt to return call back. Results was negative and signatera will be back out in the next 6 months to repeat labs.

## 2023-08-09 ENCOUNTER — Encounter (INDEPENDENT_AMBULATORY_CARE_PROVIDER_SITE_OTHER): Payer: Self-pay

## 2023-08-18 ENCOUNTER — Telehealth: Admitting: Family Medicine

## 2023-08-18 DIAGNOSIS — B9689 Other specified bacterial agents as the cause of diseases classified elsewhere: Secondary | ICD-10-CM | POA: Diagnosis not present

## 2023-08-18 DIAGNOSIS — J019 Acute sinusitis, unspecified: Secondary | ICD-10-CM

## 2023-08-18 MED ORDER — AMOXICILLIN-POT CLAVULANATE 875-125 MG PO TABS: 1.0000 | ORAL_TABLET | Freq: Two times a day (BID) | ORAL | 0 refills | Status: AC

## 2023-08-18 NOTE — Patient Instructions (Signed)
 Patricia Chandler, thank you for joining [name], NP for today's virtual visit.  While this provider is not your primary care provider (PCP), if your PCP is located in our provider database this encounter information will be shared with them immediately following your visit.   A [facility] MyChart account gives you access to today's visit and all your visits, tests, and labs performed at [facility] " click here if you don't have a [facility] MyChart account or go to [email or web address]  Consent: (Patient) Patricia Chandler provided verbal consent for this virtual visit at the beginning of the encounter.  Current Medications:  Current Outpatient Medications:    amoxicillin -clavulanate (AUGMENTIN ) 875-125 MG tablet, Take 1 tablet by mouth 2 (two) times daily for 7 days., Disp: 14 tablet, Rfl: 0   anastrozole  (ARIMIDEX ) 1 MG tablet, TAKE 1 TABLET BY MOUTH DAILY, Disp: 90 tablet, Rfl: 3   Biotin 1 MG CAPS, Take by mouth., Disp: , Rfl:    calcium -vitamin D  (OSCAL WITH D) 500-200 MG-UNIT tablet, Take 1 tablet by mouth daily with breakfast., Disp: , Rfl:    denosumab  (PROLIA ) 60 MG/ML SOLN injection, Inject 60 mg into the skin every 6 (six) months. Administer in upper arm, thigh, or abdomen, Disp: , Rfl:    Lactobacillus (PROBIOTIC ACIDOPHILUS PO), Take 1 capsule by mouth daily. , Disp: , Rfl:    latanoprost (XALATAN) 0.005 % ophthalmic solution, 1 drop at bedtime., Disp: , Rfl:    rosuvastatin  (CRESTOR ) 10 MG tablet, Take 1 tablet (10 mg total) by mouth once a week., Disp: 13 tablet, Rfl: 3   Turmeric POWD, Take by mouth., Disp: , Rfl:    zolpidem  (AMBIEN ) 5 MG tablet, Take 0.5 tablets (2.5 mg total) by mouth at bedtime as needed for sleep., Disp: 30 tablet, Rfl: 0   Medications ordered in this encounter:  Meds ordered this encounter  Medications   amoxicillin -clavulanate (AUGMENTIN ) 875-125 MG tablet    Sig: Take 1 tablet by mouth 2 (two) times  daily for 7 days.    Dispense:  14 tablet    Refill:  0    Supervising Provider:   Corine Dice [[id number]]     *If you need refills on other medications prior to your next appointment, please contact your pharmacy*  Follow-Up: Call back or seek an in-person evaluation if the symptoms worsen or if the condition fails to improve as anticipated.  [facility] [phone]  Other Instructions  - Increased rest - Increasing Fluids - Acetaminophen  / ibuprofen  as needed for fever/pain.  - Salt water gargling, chloraseptic spray and throat lozenges - Mucinex  if mucus is present and increasing.  - Saline nasal spray if congestion or if nasal passages feel dry. - Humidifying the air.    If you have been instructed to have an in-person evaluation today at a local Urgent Care facility, please use the link below. It will take you to a list of all of our available [facility], including address, phone number and hours of operation. Please do not delay care.  [facility]  If you or a family member do not have a primary care provider, use the link below to schedule a visit and establish care. When you choose a [facility] primary care physician or advanced practice provider, you gain a long-term partner in health. Find a Primary Care Provider  Learn more about [facility]'s in-office and virtual care options: [facility] -  Get Care Now

## 2023-08-18 NOTE — Progress Notes (Signed)
 Virtual Visit Consent   [name], you are scheduled for a virtual visit with a [facility] provider today. Just as with appointments in the office, your consent must be obtained to participate. Your consent will be active for this visit and any virtual visit you may have with one of our providers in the next 365 days. If you have a MyChart account, a copy of this consent can be sent to you electronically.  As this is a virtual visit, video technology does not allow for your provider to perform a traditional examination. This may limit your provider's ability to fully assess your condition. If your provider identifies any concerns that need to be evaluated in person or the need to arrange testing (such as labs, EKG, etc.), we will make arrangements to do so. Although advances in technology are sophisticated, we cannot ensure that it will always work on either your end or our end. If the connection with a video visit is poor, the visit may have to be switched to a telephone visit. With either a video or telephone visit, we are not always able to ensure that we have a secure connection.  By engaging in this virtual visit, you consent to the provision of healthcare and authorize for your insurance to be billed (if applicable) for the services provided during this visit. Depending on your insurance coverage, you may receive a charge related to this service.  I need to obtain your verbal consent now. Are you willing to proceed with your visit today? [name] has provided verbal consent on 08/18/2023 for a virtual visit (video or telephone). [name], NP  Date: 08/18/2023 10:48 AM   Virtual Visit via Video Note   I, [name], connected with  Patricia Chandler  ([id number], Sep 03, 1955) on 08/18/23 at 10:45 AM EDT by a video-enabled telemedicine application and verified that I am speaking with the correct person using two  identifiers.  Location: Patient: Virtual Visit Location Patient: Home Provider: Virtual Visit Location Provider: Home Office   I discussed the limitations of evaluation and management by telemedicine and the availability of in person appointments. The patient expressed understanding and agreed to proceed.    History of Present Illness: Patricia Chandler is a 68 y.o. who identifies as a female who was assigned female at birth, and is being seen today for sinus congestion  Two weeks of congestion and sinus pressures. It got better, but then came back on a week ago and it is not clearing. Increased fatigued, post nasal drip that is getting into the chest (making it feel heavy sensation). Cough that is producing the light green mucus now. Has a history of bronchitis post colds. Going out of country to [place] in a week as well.   Problems:  Patient Active Problem List   Diagnosis Date Noted   Aortic atherosclerosis (HCC) 09/25/2022   AC (acromioclavicular) arthritis 06/17/2022   Encounter for counseling 12/02/2021   Acquired absence of nipple 09/16/2021   Osteoporosis 05/07/2020   Snoring 01/23/2020   Insomnia 01/08/2020   Liver lesion 06/21/2018   Intra-abdominal abscess (HCC) 06/14/2018   Acute diverticulitis 06/14/2018   Acute bursitis of right shoulder 06/08/2017   Acute shoulder bursitis, left 03/08/2017   DDD (degenerative disc disease), cervical 10/08/2016   Hypokalemia 03/30/2016   Chemotherapy-induced thrombocytopenia 03/30/2016   Genetic testing 01/10/2016   History of sepsis 01/08/2016   GERD (gastroesophageal reflux disease) 01/08/2016   Breast cancer of upper-inner quadrant  of right female breast (HCC) 12/18/2015   Family history of breast cancer    Internal hemorrhoid 01/26/2011    Allergies: No Known Allergies Medications:  Current Outpatient Medications:    anastrozole  (ARIMIDEX ) 1 MG tablet, TAKE 1 TABLET BY MOUTH DAILY, Disp: 90 tablet, Rfl: 3    Biotin 1 MG CAPS, Take by mouth., Disp: , Rfl:    calcium -vitamin D  (OSCAL WITH D) 500-200 MG-UNIT tablet, Take 1 tablet by mouth daily with breakfast., Disp: , Rfl:    denosumab  (PROLIA ) 60 MG/ML SOLN injection, Inject 60 mg into the skin every 6 (six) months. Administer in upper arm, thigh, or abdomen, Disp: , Rfl:    Lactobacillus (PROBIOTIC ACIDOPHILUS PO), Take 1 capsule by mouth daily. , Disp: , Rfl:    latanoprost (XALATAN) 0.005 % ophthalmic solution, 1 drop at bedtime., Disp: , Rfl:    rosuvastatin  (CRESTOR ) 10 MG tablet, Take 1 tablet (10 mg total) by mouth once a week., Disp: 13 tablet, Rfl: 3   Turmeric POWD, Take by mouth., Disp: , Rfl:    zolpidem  (AMBIEN ) 5 MG tablet, Take 0.5 tablets (2.5 mg total) by mouth at bedtime as needed for sleep., Disp: 30 tablet, Rfl: 0  Observations/Objective: Patient is well-developed, well-nourished in no acute distress.  Resting comfortably  at home.  Head is normocephalic, atraumatic.  No labored breathing.  Speech is clear and coherent with logical content.  Patient is alert and oriented at baseline.    Assessment and Plan:  1. Acute bacterial sinusitis (Primary)  - amoxicillin -clavulanate (AUGMENTIN ) 875-125 MG tablet; Take 1 tablet by mouth 2 (two) times daily for 7 days.  Dispense: 14 tablet; Refill: 0   - Increased rest - Increasing Fluids - Acetaminophen  / ibuprofen  as needed for fever/pain.  - Salt water gargling, chloraseptic spray and throat lozenges - Mucinex  if mucus is present and increasing.  - Saline nasal spray if congestion or if nasal passages feel dry. - Humidifying the air.     Reviewed side effects, risks and benefits of medication.    Patient acknowledged agreement and understanding of the plan.   Past Medical, Surgical, Social History, Allergies, and Medications have been Reviewed.     Follow Up Instructions: I discussed the assessment and treatment plan with the patient. The patient was provided an  opportunity to ask questions and all were answered. The patient agreed with the plan and demonstrated an understanding of the instructions.  A copy of instructions were sent to the patient via MyChart unless otherwise noted below.    The patient was advised to call back or seek an in-person evaluation if the symptoms worsen or if the condition fails to improve as anticipated.    [name], NP

## 2023-08-21 ENCOUNTER — Encounter (INDEPENDENT_AMBULATORY_CARE_PROVIDER_SITE_OTHER): Payer: Self-pay

## 2023-08-23 ENCOUNTER — Encounter (INDEPENDENT_AMBULATORY_CARE_PROVIDER_SITE_OTHER): Payer: Self-pay

## 2023-09-29 ENCOUNTER — Encounter: Payer: Self-pay | Admitting: Family Medicine

## 2023-09-29 ENCOUNTER — Ambulatory Visit: Payer: Medicare Other | Admitting: Family Medicine

## 2023-09-29 VITALS — BP 108/72 | HR 98 | Temp 97.5°F | Ht 63.0 in | Wt 152.6 lb

## 2023-09-29 DIAGNOSIS — Z0000 Encounter for general adult medical examination without abnormal findings: Secondary | ICD-10-CM | POA: Diagnosis not present

## 2023-09-29 DIAGNOSIS — M810 Age-related osteoporosis without current pathological fracture: Secondary | ICD-10-CM | POA: Diagnosis not present

## 2023-09-29 DIAGNOSIS — E785 Hyperlipidemia, unspecified: Secondary | ICD-10-CM

## 2023-09-29 DIAGNOSIS — E663 Overweight: Secondary | ICD-10-CM

## 2023-09-29 DIAGNOSIS — I700 Atherosclerosis of aorta: Secondary | ICD-10-CM | POA: Diagnosis not present

## 2023-09-29 DIAGNOSIS — Z131 Encounter for screening for diabetes mellitus: Secondary | ICD-10-CM | POA: Diagnosis not present

## 2023-09-29 MED ORDER — ZOLPIDEM TARTRATE 5 MG PO TABS: 2.5000 mg | ORAL_TABLET | Freq: Every evening | ORAL | 2 refills | Status: DC | PRN

## 2023-09-29 NOTE — Patient Instructions (Addendum)
 See if cancer center can add lipid and a1c- I ordered these as future so they have diagnosis codes if needed  Tetanus, Diphtheria, and Pertussis (Tdap) at pharmacy  Otherwise no changes other than perhaps pushing fiber/plant based nutrition  Recommended follow up: Return in 1 year (on 09/28/2024) for physical or sooner if needed.Schedule b4 you leave.

## 2023-09-29 NOTE — Progress Notes (Signed)
 Phone [phone]   Subjective:  Patient presents today for their annual physical. Chief complaint-noted.   See problem oriented charting- ROS- full  review of systems was completed and negative Per full ROS sheet completed by patient except for topics noted under acute/chronic concerns  The following were reviewed and entered/updated in epic: Past Medical History:  Diagnosis Date   Allergy 07/2023   chest  & nasal congestion   Anemia    due to chemo   Anxiety    Arthritis    Breast cancer (HCC) dx'd 2017   right   Cardiomyopathy (HCC)    mild, per cardiology note; due to Herceptin    Dental crowns present    Family history of adverse reaction to anesthesia    pt's daughter has hx. of post-op nausea   GERD (gastroesophageal reflux disease)    Heart murmur    History of breast cancer 2017   right   History of chemotherapy    finished chemo 04/15/2016   Personal history of chemotherapy    Personal history of radiation therapy    Right Breast Cancer   Port catheter in place 01/08/2016   Ongoing herceptin  and projeta until September 2018     Rash 08/04/2016   under right arm - states recent tick bite; to see MD 08/05/2016   Recurrent breast cancer, right (HCC) dx'd 10/2018   Runny nose 06/06/2016   clear drainage, per pt.   Patient Active Problem List   Diagnosis Date Noted   Breast cancer of upper-inner quadrant of right female breast (HCC) 12/18/2015    Priority: High   Aortic atherosclerosis (HCC) 09/25/2022    Priority: Medium    Osteoporosis 05/07/2020    Priority: Medium    Acute diverticulitis 06/14/2018    Priority: Medium    Hypokalemia 03/30/2016    Priority: Medium    Chemotherapy-induced thrombocytopenia 03/30/2016    Priority: Medium    GERD (gastroesophageal reflux disease) 01/08/2016    Priority: Medium    Acute bursitis of right shoulder 06/08/2017    Priority: Low   Acute shoulder bursitis, left 03/08/2017    Priority: Low   Genetic testing  01/10/2016    Priority: Low   History of sepsis 01/08/2016    Priority: Low   Family history of breast cancer     Priority: Low   Internal hemorrhoid 01/26/2011    Priority: Low   AC (acromioclavicular) arthritis 06/17/2022    Priority: 1.   DDD (degenerative disc disease), cervical 10/08/2016    Priority: 1.   Encounter for counseling 12/02/2021   Acquired absence of nipple 09/16/2021   Snoring 01/23/2020   Insomnia 01/08/2020   Liver lesion 06/21/2018   Intra-abdominal abscess (HCC) 06/14/2018   Past Surgical History:  Procedure Laterality Date   AUGMENTATION MAMMAPLASTY Left    BREAST BIOPSY Right 11/30/2018   Procedure: RIGHT BREAST CHEST WALL RECURRENCE EXCISION;  Surgeon: Enid Harry, MD;  Location[facility] OR;  Service: General;  Laterality: Right;   BREAST ENHANCEMENT SURGERY Bilateral 2005   breast mass removal Right 11/30/2018   BREAST RECONSTRUCTION WITH PLACEMENT OF TISSUE EXPANDER AND FLEX HD (ACELLULAR HYDRATED DERMIS) Right 05/19/2016   Procedure: RIGHT BREAST RECONSTRUCTION WITH PLACEMENT OF TISSUE EXPANDER AND  ALLODERM.;  Surgeon: Alger Infield, MD;  Location: [facility];  Service: Plastics;  Laterality: Right;   COLONOSCOPY     COLONOSCOPY WITH PROPOFOL   10/02/2014   DEBRIDEMENT AND CLOSURE WOUND Right 06/08/2016   Procedure: DEBRIDEMENT RIGHT  MASTECTOMY FLAP, TISSUE EXPANSION OF RIGHT CHEST;  Surgeon: Alger Infield, MD;  Location: [facility] OR;  Service: Plastics;  Laterality: Right;   MASTECTOMY Right    MASTOPEXY Left 08/11/2016   Procedure: LEFT BREAST MASTOPEXY;  Surgeon: Alger Infield, MD;  Location: [facility];  Service: Plastics;  Laterality: Left;   NIPPLE SPARING MASTECTOMY/SENTINAL LYMPH NODE BIOPSY/RECONSTRUCTION/PLACEMENT OF TISSUE EXPANDER Right 05/19/2016   Procedure: RIGHT NIPPLE SPARING MASTECTOMY WITH SENTINAL LYMPH NODE BIOPSY WITH BLUE DYE INJECTION;  Surgeon: Enid Harry, MD;  Location: [facility];  Service: General;  Laterality: Right;   PORT-A-CATH REMOVAL Right 04/26/2019   Procedure: REMOVAL PORT-A-CATH;  Surgeon: Enid Harry, MD;  Location: [facility];  Service: General;  Laterality: Right;   PORTACATH PLACEMENT N/A 12/30/2015   Procedure: INSERTION PORT-A-CATH WITH US ;  Surgeon: Enid Harry, MD;  Location: [facility];  Service: General;  Laterality: N/A;   PORTACATH PLACEMENT N/A 11/30/2018   Procedure: INSERTION PORT-A-CATH WITH ULTRASOUND;  Surgeon: Enid Harry, MD;  Location: [facility] OR;  Service: General;  Laterality: N/A;   REDUCTION MAMMAPLASTY     REMOVAL OF TISSUE EXPANDER AND PLACEMENT OF IMPLANT Right 08/11/2016   Procedure: REMOVAL OF RIGHT  TISSUE EXPANDER AND PLACEMENT OF IMPLANT;  Surgeon: Alger Infield, MD;  Location:  SURGERY CENTER;  Service: Plastics;  Laterality: Right;    Family History  Problem Relation Age of Onset   Breast cancer Maternal Aunt        dx in her 8s   Cancer Maternal Aunt    Stroke Father        late 54s   Hypertension Father    Pulmonary fibrosis Mother    Vision loss Mother    Breast cancer Paternal Aunt 65   Cancer Paternal Grandmother        abdominal; cervical vs uterine dx in her 61s   Stomach cancer Maternal Grandmother    Parkinson's disease Maternal Grandfather    Anesthesia problems Daughter        post-op nausea   Colon cancer Neg Hx    Esophageal cancer Neg Hx    Rectal cancer Neg Hx     Medications- reviewed and updated Current Outpatient Medications  Medication Sig Dispense Refill   anastrozole  (ARIMIDEX ) 1 MG tablet TAKE 1 TABLET BY MOUTH DAILY 90 tablet 3   Biotin 1 MG CAPS Take by mouth.     calcium -vitamin D  (OSCAL WITH D) 500-200 MG-UNIT tablet Take 1 tablet by mouth daily with breakfast.     denosumab  (PROLIA ) 60 MG/ML SOLN injection Inject 60 mg into the skin every 6 (six) months. Administer in upper arm, thigh, or abdomen     Lactobacillus  (PROBIOTIC ACIDOPHILUS PO) Take 1 capsule by mouth daily.      latanoprost (XALATAN) 0.005 % ophthalmic solution 1 drop at bedtime.     rosuvastatin  (CRESTOR ) 10 MG tablet Take 1 tablet (10 mg total) by mouth once a week. 13 tablet 3   Turmeric POWD Take by mouth.     zolpidem  (AMBIEN ) 5 MG tablet Take 0.5 tablets (2.5 mg total) by mouth at bedtime as needed for sleep. 30 tablet 2   No current facility-administered medications for this visit.    Allergies-reviewed and updated No Known Allergies  Social History   Social History Narrative   Married 1978 (Al). 2 girls 33 and 36 in 2018. Nellie Banas (liz) lives in [place], [place] with 7 yo son (single right  now). and [name] in [employer], [place]--> now [place] with husband and 58 year old daughter (born with heart defect with missing valve and initially not closed VSD--still weighs heavily on family- likely will have to be repaired).       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    Graduated from [place]  [employer]. [employer] prior.       Hobbies: gardening, decorating, sewing, enjoys creating   Objective  Objective:  BP 108/72   Pulse 98   Temp (!) 97.5 F (36.4 C)   Ht 5' 3 (1.6 m)   Wt 152 lb 9.6 oz (69.2 kg)   SpO2 99%   BMI 27.03 kg/m  Gen: NAD, resting comfortably HEENT: Mucous membranes are moist. Oropharynx normal Neck: no thyromegaly CV: RRR no murmurs rubs or gallops Lungs: CTAB no crackles, wheeze, rhonchi Abdomen: soft/nontender/nondistended/normal bowel sounds. No rebound or guarding.  Ext: no edema Skin: warm, dry Neuro: grossly normal, moves all extremities, PERRLA   Assessment and Plan   68 y.o. female presenting for annual physical.  Health Maintenance counseling: 1. Anticipatory guidance: Patient counseled regarding regular dental exams -q6 months, eye exams - yearly,  avoiding smoking and second hand smoke , limiting alcohol to 1 beverage per day-  sparing glass of wine 2-3 glasses max a week and other weeks not , no illicit drugs .   2. Risk factor reduction:  Advised patient of need for regular exercise and diet rich and fruits and vegetables to reduce risk of heart attack and stroke.  Exercise- still doing a fantastic job.-Gardening, walking, exercise machine, weights at least 5 days a week.  Diet/weight management-weight within 2 pounds of last year-continues eat rather clean/healthy diet.  Wt Readings from Last 3 Encounters:  09/29/23 152 lb 9.6 oz (69.2 kg)  05/24/23 155 lb 6.4 oz (70.5 kg)  10/08/22 147 lb (66.7 kg)  3. Immunizations/screenings/ancillary studies-recommended Tdap at pharmacy once again but otherwise up-to-date-typically gets COVID shot in the fall  Immunization History  Administered Date(s) Administered   Fluad Quad(high Dose 65+) 01/22/2020, 12/29/2021   Influenza, High Dose Seasonal PF 12/10/2018   Influenza,inj,Quad PF,6+ Mos 02/18/2016, 02/01/2017, 02/28/2018   Influenza-Unspecified 01/19/2016, 02/19/2020, 02/26/2021   Moderna Covid-19 Fall Seasonal Vaccine 77yrs & older 02/11/2023   PFIZER(Purple Top)SARS-COV-2 Vaccination 05/12/2019, 06/02/2019, 12/20/2019, 11/13/2020, 02/26/2021   PNEUMOCOCCAL CONJUGATE-20 12/10/2020   Pfizer Covid-19 Vaccine Bivalent Booster 38yrs & up 02/26/2021   Pneumococcal Conjugate-13 11/02/2018   Pneumococcal-Unspecified 12/10/2020   Tdap 07/20/2011   Zoster Recombinant(Shingrix) 06/09/2017, 08/12/2017   4. Cervical cancer screening- still sees GYN but released from [facility] after last one 5. Breast cancer history with recurrence-mammogram August 2024 reassuring with  breast MRI February 2025-also reassuring thankfully on the left breast.  Remains on anastrozole  6. Colon cancer screening - May 2020 with 7-year repeat due to polyp history but not yet due 7. Skin cancer screening-sees dermatology as needed at least every few years- considering update .advised regular sunscreen use.  Denies worrisome, changing, or new skin lesions.  8. Birth control/STD check-postmenopausal monogamous 9. Osteoporosis screening at 65-see discussion below 10. Smoking associated screening -never smoker  Status of chronic or acute concerns   #great trip to [place] recently- 3rd trip and majored in french- got to speak out  #allergies- has dealt with a lot of runny nose with travel to [place] and coughing up mucus- also wonders if reflux related. Got better coming back to  [place]- but they were going through spring. Nasacort - helps some. Has been doing famotidine but may switch back to proton pump inhibitor (PPI) stomach acid reducer like omeprazole or similar short term. If not improving will let me know- could do x-ray if needed   # History of breast cancer recurrence on the right -follows with Dr. Lambert Pillion 1.  Surgery to remove the tumor 11/30/2018 2.  Adjuvant chemotherapy with Taxotere  and Cytoxan  x6 cycles started 12/30/2018 3.  Adjuvant radiation 4.  Follow-up adjuvant antiestrogen therapy  #hyperlipidemia # Aortic atherosclerosis finding S: Medication: Rosuvastatin  10 mg weekly- had bene taking near gaviscon but stopped about 3 months ago Lab Results  Component Value Date   CHOL 206 (H) 10/08/2022   HDL 91 10/08/2022   LDLCALC 108 (H) 10/08/2022   TRIG 35 10/08/2022   CHOLHDL 2.3 10/08/2022  A/P: aortic atherosclerosis (presumed stable)- LDL goal ideally <70 - update lipids today- even if LDL under 100 likely tolerate this -she is going of the ask cancer center to do lipid panel and a1c as well  # Insomnia S:Medication: Ambien  2.5 mg as needed prescribed by oncology in past -belsomra  doesn't help A/P: reasonable control- continue current medications- I refilled   # Osteoporosis # Low vitamin D -noted February 2025 S: Last DEXA: 04/30/22  Medication (bisphosphonate or prolia ): 1st prolia  06/16/2017 and last February 2023 potentially per Dr. Lee Public. 1 year break for [place]- restart  January 2024 -reflux on fosamax - would need reclast if using bisphosphonate -Most recent Prolia  was 05/24/2023 with oncology  Calcium : 1200mg  (through diet ok) recommended  Vitamin D : 1000 units a day recommended at least- 2000 now A/P: bone density hopefully stable at least- continue current medications and we can take over Prolia  if needed again for convenience- she will reach out- recheck D with hopes at least over 30    Recommended follow up: Return in 1 year (on 09/28/2024) for physical or sooner if needed.Schedule b4 you leave. Future Appointments  Date Time Provider Department Center  10/12/2023  9:45 AM [facility] ANNUAL WELLNESS VISIT 1 [facility]  11/22/2023  9:45 AM [facility] LAB [facility] None  11/22/2023 10:15 AM Cameron Cea, MD [facility] None  11/22/2023 11:00 AM [facility] FLUSH [facility] None  05/24/2024 10:15 AM [facility] LAB [facility] None  05/24/2024 10:40 AM Causey, Laura Polio, NP [facility] None  05/24/2024 11:30 AM [facility] FLUSH [facility] None   Lab/Order associations:NOT fasting   ICD-10-CM   1. Routine general medical examination at a health care facility  Z00.00     2. Age-related osteoporosis without current pathological fracture  M81.0     3. Osteoporosis, unspecified osteoporosis type, unspecified pathological fracture presence  M81.0     4. Hyperlipidemia, unspecified hyperlipidemia type  E78.5 Lipid panel    5. Aortic atherosclerosis (HCC)  I70.0     6. Screening for diabetes mellitus  Z13.1 Hemoglobin A1c    7. Overweight  E66.3 Hemoglobin A1c      Meds ordered this encounter  Medications   zolpidem  (AMBIEN ) 5 MG tablet    Sig: Take 0.5 tablets (2.5 mg total) by mouth at bedtime as needed for sleep.    Dispense:  30 tablet    Refill:  2    Return precautions advised.  Clarisa Crooked, MD

## 2023-09-30 ENCOUNTER — Other Ambulatory Visit: Payer: Self-pay | Admitting: Family Medicine

## 2023-10-04 ENCOUNTER — Encounter (INDEPENDENT_AMBULATORY_CARE_PROVIDER_SITE_OTHER): Payer: Self-pay

## 2023-10-10 ENCOUNTER — Encounter: Payer: Self-pay | Admitting: Adult Health

## 2023-10-11 ENCOUNTER — Encounter (INDEPENDENT_AMBULATORY_CARE_PROVIDER_SITE_OTHER): Payer: Self-pay

## 2023-10-12 ENCOUNTER — Ambulatory Visit (INDEPENDENT_AMBULATORY_CARE_PROVIDER_SITE_OTHER): Payer: Medicare Other

## 2023-10-12 VITALS — Ht 63.0 in | Wt 152.0 lb

## 2023-10-12 DIAGNOSIS — Z0000 Encounter for general adult medical examination without abnormal findings: Secondary | ICD-10-CM | POA: Diagnosis not present

## 2023-10-12 NOTE — Patient Instructions (Signed)
 Patricia Chandler , Thank you for taking time out of your busy schedule to complete your Annual Wellness Visit with me. I enjoyed our conversation and look forward to speaking with you again next year. I, as well as your care team,  appreciate your ongoing commitment to your health goals. Please review the following plan we discussed and let me know if I can assist you in the future. Your Game plan/ To Do List    Referrals: If you haven't heard from the office you've been referred to, please reach out to them at the phone provided.   Follow up Visits: Next Medicare AWV with our clinical staff: 10/17/24   Have you seen your provider in the last 6 months (3 months if uncontrolled diabetes)? Yes Next Office Visit with your provider: 10/03/24  Clinician Recommendations:  Aim for 30 minutes of exercise or brisk walking, 6-8 glasses of water, and 5 servings of fruits and vegetables each day.       This is a list of the screening recommended for you and due dates:  Health Maintenance  Topic Date Due   COVID-19 Vaccine (7 - Pfizer risk 2024-25 season) 10/15/2023*   Flu Shot  11/19/2023   Medicare Annual Wellness Visit  10/11/2024   Mammogram  06/26/2025   Colon Cancer Screening  09/08/2025   Pneumococcal Vaccine for age over 59  Completed   DEXA scan (bone density measurement)  Completed   Hepatitis C Screening  Completed   Zoster (Shingles) Vaccine  Completed   Hepatitis B Vaccine  Aged Out   HPV Vaccine  Aged Out   Meningitis B Vaccine  Aged Out   DTaP/Tdap/Td vaccine  Discontinued  *Topic was postponed. The date shown is not the original due date.    Advanced directives: (Copy Requested) Please bring a copy of your health care power of attorney and living will to the office to be added to your chart at your convenience. You can mail to [facility] [address] [place], [place] [address] or email to [email or web address] Advance Care Planning is important because  it:  [x]  Makes sure you receive the medical care that is consistent with your values, goals, and preferences  [x]  It provides guidance to your family and loved ones and reduces their decisional burden about whether or not they are making the right decisions based on your wishes.  Follow the link provided in your after visit summary or read over the paperwork we have mailed to you to help you started getting your Advance Directives in place. If you need assistance in completing these, please reach out to us  so that we can help you!  See attachments for Preventive Care and Fall Prevention Tips.

## 2023-10-12 NOTE — Progress Notes (Signed)
 Subjective:   Patricia Chandler is a 68 y.o. who presents for a Medicare Wellness preventive visit.  As a reminder, Annual Wellness Visits don't include a physical exam, and some assessments may be limited, especially if this visit is performed virtually. We may recommend an in-person follow-up visit with your provider if needed.  Visit Complete: Virtual I connected with  [name] on 10/12/23 by a audio enabled telemedicine application and verified that I am speaking with the correct person using two identifiers.  Patient Location: Home  Provider Location: Office/Clinic  I discussed the limitations of evaluation and management by telemedicine. The patient expressed understanding and agreed to proceed.  Vital Signs: Because this visit was a virtual/telehealth visit, some criteria may be missing or patient reported. Any vitals not documented were not able to be obtained and vitals that have been documented are patient reported.  VideoDeclined- This patient declined Librarian, academic. Therefore the visit was completed with audio only.  Persons Participating in Visit: Patient.  AWV Questionnaire: Yes: Patient Medicare AWV questionnaire was completed by the patient on 10/08/23; I have confirmed that all information answered by patient is correct and no changes since this date.  Cardiac Risk Factors include: advanced age (>41men, >20 women);dyslipidemia     Objective:    Today's Vitals   10/12/23 0908  Weight: 152 lb (68.9 kg)  Height: 5' 3 (1.6 m)   Body mass index is 26.93 kg/m.     10/12/2023    9:13 AM 10/06/2022   10:25 AM 09/01/2021    3:12 PM 08/05/2021   10:04 AM 05/03/2019    9:59 AM 04/12/2019    1:12 PM 02/09/2019    3:05 PM  Advanced Directives  Does Patient Have a Medical Advance Directive? Yes Yes Yes Yes Yes Yes No  Type of Estate agent of [place];Living will Healthcare Power of  [place];Living will Healthcare Power of [place];Living will Living will Healthcare Power of [place];Living will Healthcare Power of [place];Living will   Does patient want to make changes to medical advance directive?      No - Patient declined   Copy of Healthcare Power of Attorney in Chart? No - copy requested No - copy requested Yes - validated most recent copy scanned in chart (See row information)  Yes - validated most recent copy scanned in chart (See row information) No - copy requested   Would patient like information on creating a medical advance directive?   No - Patient declined    No - Patient declined    Current Medications (verified) Outpatient Encounter Medications as of 10/12/2023  Medication Sig   anastrozole  (ARIMIDEX ) 1 MG tablet TAKE 1 TABLET BY MOUTH DAILY   Biotin 1 MG CAPS Take by mouth.   calcium -vitamin D  (OSCAL WITH D) 500-200 MG-UNIT tablet Take 1 tablet by mouth daily with breakfast.   COMIRNATY syringe    denosumab  (PROLIA ) 60 MG/ML SOLN injection Inject 60 mg into the skin every 6 (six) months. Administer in upper arm, thigh, or abdomen   FLUZONE  HIGH-DOSE 0.5 ML injection    Lactobacillus (PROBIOTIC ACIDOPHILUS PO) Take 1 capsule by mouth daily.    latanoprost (XALATAN) 0.005 % ophthalmic solution 1 drop at bedtime.   rosuvastatin  (CRESTOR ) 10 MG tablet TAKE 1 TABLET BY MOUTH ONCE WEEKLY   Turmeric POWD Take by mouth.   zolpidem  (AMBIEN ) 5 MG tablet Take 0.5 tablets (2.5 mg total) by mouth at bedtime as needed for sleep.  No facility-administered encounter medications on file as of 10/12/2023.    Allergies (verified) Patient has no known allergies.   History: Past Medical History:  Diagnosis Date   Allergy 07/2023   chest  & nasal congestion   Anemia    due to chemo   Anxiety    Arthritis    Breast cancer (HCC) dx'd 2017   right   Cardiomyopathy (HCC)    mild, per cardiology note; due to Herceptin    Dental crowns present    Family history of  adverse reaction to anesthesia    pt's daughter has hx. of post-op nausea   GERD (gastroesophageal reflux disease)    Heart murmur    History of breast cancer 2017   right   History of chemotherapy    finished chemo 04/15/2016   Personal history of chemotherapy    Personal history of radiation therapy    Right Breast Cancer   Port catheter in place 01/08/2016   Ongoing herceptin  and projeta until September 2018     Rash 08/04/2016   under right arm - states recent tick bite; to see MD 08/05/2016   Recurrent breast cancer, right (HCC) dx'd 10/2018   Runny nose 06/06/2016   clear drainage, per pt.   Past Surgical History:  Procedure Laterality Date   AUGMENTATION MAMMAPLASTY Left    BREAST BIOPSY Right 11/30/2018   Procedure: RIGHT BREAST CHEST WALL RECURRENCE EXCISION;  Surgeon: Ebbie Cough, MD;  Location[facility] OR;  Service: General;  Laterality: Right;   BREAST ENHANCEMENT SURGERY Bilateral 2005   breast mass removal Right 11/30/2018   BREAST RECONSTRUCTION WITH PLACEMENT OF TISSUE EXPANDER AND FLEX HD (ACELLULAR HYDRATED DERMIS) Right 05/19/2016   Procedure: RIGHT BREAST RECONSTRUCTION WITH PLACEMENT OF TISSUE EXPANDER AND  ALLODERM.;  Surgeon: Earlis Ranks, MD;  Location: [facility];  Service: Plastics;  Laterality: Right;   COLONOSCOPY     COLONOSCOPY WITH PROPOFOL   10/02/2014   DEBRIDEMENT AND CLOSURE WOUND Right 06/08/2016   Procedure: DEBRIDEMENT RIGHT MASTECTOMY FLAP, TISSUE EXPANSION OF RIGHT CHEST;  Surgeon: Earlis Ranks, MD;  Location: [facility] OR;  Service: Plastics;  Laterality: Right;   MASTECTOMY Right    MASTOPEXY Left 08/11/2016   Procedure: LEFT BREAST MASTOPEXY;  Surgeon: Earlis Ranks, MD;  Location: [facility];  Service: Plastics;  Laterality: Left;   NIPPLE SPARING MASTECTOMY/SENTINAL LYMPH NODE BIOPSY/RECONSTRUCTION/PLACEMENT OF TISSUE EXPANDER Right 05/19/2016   Procedure: RIGHT NIPPLE SPARING MASTECTOMY WITH SENTINAL LYMPH  NODE BIOPSY WITH BLUE DYE INJECTION;  Surgeon: Cough Ebbie, MD;  Location: [facility];  Service: General;  Laterality: Right;   PORT-A-CATH REMOVAL Right 04/26/2019   Procedure: REMOVAL PORT-A-CATH;  Surgeon: Ebbie Cough, MD;  Location: [facility];  Service: General;  Laterality: Right;   PORTACATH PLACEMENT N/A 12/30/2015   Procedure: INSERTION PORT-A-CATH WITH US ;  Surgeon: Cough Ebbie, MD;  Location: [facility];  Service: General;  Laterality: N/A;   PORTACATH PLACEMENT N/A 11/30/2018   Procedure: INSERTION PORT-A-CATH WITH ULTRASOUND;  Surgeon: Ebbie Cough, MD;  Location: [facility] OR;  Service: General;  Laterality: N/A;   REDUCTION MAMMAPLASTY     REMOVAL OF TISSUE EXPANDER AND PLACEMENT OF IMPLANT Right 08/11/2016   Procedure: REMOVAL OF RIGHT  TISSUE EXPANDER AND PLACEMENT OF IMPLANT;  Surgeon: Earlis Ranks, MD;  Location: [facility];  Service: Plastics;  Laterality: Right;   Family History  Problem Relation Age of Onset   Breast cancer Maternal Aunt  dx in her 89s   Cancer Maternal Aunt    Stroke Father        late 26s   Hypertension Father    Pulmonary fibrosis Mother    Vision loss Mother    Breast cancer Paternal Aunt 66   Cancer Paternal Grandmother        abdominal; cervical vs uterine dx in her 52s   Stomach cancer Maternal Grandmother    Parkinson's disease Maternal Grandfather    Anesthesia problems Daughter        post-op nausea   Colon cancer Neg Hx    Esophageal cancer Neg Hx    Rectal cancer Neg Hx    Social History   Socioeconomic History   Marital status: Married    Spouse name: Not on file   Number of children: 2   Years of education: Not on file   Highest education level: Bachelor's degree (e.g., BA, AB, BS)  Occupational History   Occupation: retired  Tobacco Use   Smoking status: Never   Smokeless tobacco: Never  Vaping Use   Vaping status: Never Used   Substance and Sexual Activity   Alcohol use: Yes    Alcohol/week: 3.0 standard drinks of alcohol    Types: 3 Glasses of wine per week    Comment: occasionally   Drug use: No   Sexual activity: Not on file  Other Topics Concern   Not on file  Social History Narrative   Married 1978 (Al). 2 girls 33 and 36 in 2018. Almarie (liz) lives in [place], [place] with 32 yo son (single right now). and [name] in [place], [place]--> now [place] with husband and 44 year old daughter (born with heart defect with missing valve and initially not closed VSD--still weighs heavily on family- likely will have to be repaired).       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    Graduated from [employer]. [employer] prior.       Hobbies: gardening, decorating, sewing, enjoys creating   Social Drivers of Health   Financial Resource Strain: Low Risk  (10/11/2023)   Overall Financial Resource Strain (CARDIA)    Difficulty of Paying Living Expenses: Not hard at all  Food Insecurity: No Food Insecurity (10/11/2023)   Hunger Vital Sign    Worried About Running Out of Food in the Last Year: Never true    Ran Out of Food in the Last Year: Never true  Transportation Needs: No Transportation Needs (10/11/2023)   PRAPARE - Administrator, Civil Service (Medical): No    Lack of Transportation (Non-Medical): No  Physical Activity: Sufficiently Active (10/11/2023)   Exercise Vital Sign    Days of Exercise per Week: 5 days    Minutes of Exercise per Session: 40 min  Stress: No Stress Concern Present (10/11/2023)   [employer] of Occupational Health - Occupational Stress Questionnaire    Feeling of Stress: Only a little  Social Connections: Moderately Integrated (10/11/2023)   Social Connection and Isolation Panel    Frequency of Communication with Friends and Family: Three times a week    Frequency of Social Gatherings with Friends and  Family: Once a week    Attends Religious Services: 1 to 4 times per year    Active Member of [employer] or Organizations: No    Attends Engineer, structural: Not on file    Marital Status: Married  Tobacco Counseling Counseling given: Not Answered    Clinical Intake:  Pre-visit preparation completed: Yes  Pain : No/denies pain     BMI - recorded: 26.93 Nutritional Status: BMI 25 -29 Overweight Nutritional Risks: None Diabetes: No  No results found for: HGBA1C   How often do you need to have someone help you when you read instructions, pamphlets, or other written materials from your doctor or pharmacy?: 1 - Never  Interpreter Needed?: No      Activities of Daily Living     10/08/2023    7:57 PM  In your present state of health, do you have any difficulty performing the following activities:  Hearing? 0  Vision? 0  Difficulty concentrating or making decisions? 0  Walking or climbing stairs? 0  Dressing or bathing? 0  Doing errands, shopping? 0  Preparing Food and eating ? N  Using the Toilet? N  In the past six months, have you accidently leaked urine? N  Do you have problems with loss of bowel control? N  Managing your Medications? N  Managing your Finances? N  Housekeeping or managing your Housekeeping? N    Patient Care Team: Katrinka Garnette KIDD, MD as PCP - General (Family Medicine) Tyree Nanetta SAILOR, RN as Oncology Nurse Navigator Gle[place], Stephane BROCKS, RN (Inactive) as Oncology Nurse Navigator I have updated your Care Teams any recent Medical Services you may have received from other providers in the past year.     Assessment:   This is a routine wellness examination for Patricia Chandler.  Hearing/Vision screen Hearing Screening - Comments:: Pt denies any hearing issues  Vision Screening - Comments:: Wears rx glasses - up to date with routine eye exams with Dr Thom Hamilton with [facility]     Goals Addressed             This Visit's Progress     Patient Stated       Maintain health and activity        Depression Screen     10/12/2023    9:11 AM 10/06/2022   10:23 AM 03/02/2022   10:25 AM 11/28/2021    9:50 AM 08/05/2021   10:02 AM 09/20/2020    2:31 PM 06/13/2018    2:20 PM  PHQ 2/9 Scores  PHQ - 2 Score 0 0 0 0 0 0 0    Fall Risk     10/08/2023    7:57 PM 10/05/2022    8:17 AM 09/25/2022    9:54 AM 08/05/2021   10:05 AM 09/20/2020    2:31 PM  Fall Risk   Falls in the past year? 0 0 0 0 0  Number falls in past yr:  0 0 0   Injury with Fall?  0 0 0   Risk for fall due to : No Fall Risks Impaired vision No Fall Risks Impaired vision No Fall Risks  Follow up Falls prevention discussed Falls prevention discussed Falls evaluation completed Falls prevention discussed       Data saved with a previous flowsheet row definition    MEDICARE RISK AT HOME:  Medicare Risk at Home Any stairs in or around the home?: (Patient-Rptd) Yes If so, are there any without handrails?: (Patient-Rptd) Yes Home free of loose throw rugs in walkways, pet beds, electrical cords, etc?: (Patient-Rptd) No Adequate lighting in your home to reduce risk of falls?: (Patient-Rptd) Yes Life alert?: (Patient-Rptd) No Use of a cane, walker or w/c?: (Patient-Rptd) No Grab bars in the  bathroom?: (Patient-Rptd) No Shower chair or bench in shower?: (Patient-Rptd) No Elevated toilet seat or a handicapped toilet?: (Patient-Rptd) No  TIMED UP AND GO:  Was the test performed?  No  Cognitive Function: 6CIT completed        10/12/2023    9:13 AM 08/05/2021   10:07 AM  6CIT Screen  What Year? 0 points 0 points  What month? 0 points 0 points  What time? 0 points 0 points  Count back from 20 0 points 0 points  Months in reverse 0 points 0 points  Repeat phrase 0 points 0 points  Total Score 0 points 0 points    Immunizations Immunization History  Administered Date(s) Administered   Fluad Quad(high Dose 65+) 01/22/2020, 12/29/2021   Influenza, High Dose  Seasonal PF 12/10/2018   Influenza,inj,Quad PF,6+ Mos 02/18/2016, 02/01/2017, 02/28/2018   Influenza-Unspecified 01/19/2016, 02/19/2020, 02/26/2021   Moderna Covid-19 Fall Seasonal Vaccine 63yrs & older 02/11/2023   PFIZER(Purple Top)SARS-COV-2 Vaccination 05/12/2019, 06/02/2019, 12/20/2019, 11/13/2020, 02/26/2021   PNEUMOCOCCAL CONJUGATE-20 12/10/2020   Pfizer Covid-19 Vaccine Bivalent Booster 18yrs & up 02/26/2021   Pneumococcal Conjugate-13 11/02/2018   Pneumococcal-Unspecified 12/10/2020   Tdap 07/20/2011   Zoster Recombinant(Shingrix) 06/09/2017, 08/12/2017    Screening Tests Health Maintenance  Topic Date Due   COVID-19 Vaccine (7 - Pfizer risk 2024-25 season) 10/15/2023 (Originally 08/12/2023)   INFLUENZA VACCINE  11/19/2023   Medicare Annual Wellness (AWV)  10/11/2024   MAMMOGRAM  06/26/2025   Colonoscopy  09/08/2025   Pneumococcal Vaccine: 50+ Years  Completed   DEXA SCAN  Completed   Hepatitis C Screening  Completed   Zoster Vaccines- Shingrix  Completed   Hepatitis B Vaccines  Aged Out   HPV VACCINES  Aged Out   Meningococcal B Vaccine  Aged Out   DTaP/Tdap/Td  Discontinued    Health Maintenance  There are no preventive care reminders to display for this patient.  Health Maintenance Items Addressed: See Nurse Notes at the end of this note  Additional Screening:  Vision Screening: Recommended annual ophthalmology exams for early detection of glaucoma and other disorders of the eye. Would you like a referral to an eye doctor? No    Dental Screening: Recommended annual dental exams for proper oral hygiene  Community Resource Referral / Chronic Care Management: CRR required this visit?  No   CCM required this visit?  No   Plan:    I have personally reviewed and noted the following in the patient's chart:   Medical and social history Use of alcohol, tobacco or illicit drugs  Current medications and supplements including opioid prescriptions. Patient is  not currently taking opioid prescriptions. Functional ability and status Nutritional status Physical activity Advanced directives List of other physicians Hospitalizations, surgeries, and ER visits in previous 12 months Vitals Screenings to include cognitive, depression, and falls Referrals and appointments  In addition, I have reviewed and discussed with patient certain preventive protocols, quality metrics, and best practice recommendations. A written personalized care plan for preventive services as well as general preventive health recommendations were provided to patient.   Ellouise VEAR Haws, LPN   [id number]/75/[id number]   After Visit Summary: (MyChart) Due to this being a telephonic visit, the after visit summary with patients personalized plan was offered to patient via MyChart   Notes: Nothing significant to report at this time.

## 2023-10-19 ENCOUNTER — Other Ambulatory Visit: Payer: Self-pay | Admitting: *Deleted

## 2023-10-19 DIAGNOSIS — Z1239 Encounter for other screening for malignant neoplasm of breast: Secondary | ICD-10-CM

## 2023-10-19 DIAGNOSIS — C50211 Malignant neoplasm of upper-inner quadrant of right female breast: Secondary | ICD-10-CM

## 2023-11-01 ENCOUNTER — Encounter (INDEPENDENT_AMBULATORY_CARE_PROVIDER_SITE_OTHER): Payer: Self-pay

## 2023-11-09 ENCOUNTER — Other Ambulatory Visit: Payer: Self-pay | Admitting: Adult Health

## 2023-11-09 DIAGNOSIS — Z1231 Encounter for screening mammogram for malignant neoplasm of breast: Secondary | ICD-10-CM

## 2023-11-11 DIAGNOSIS — Z853 Personal history of malignant neoplasm of breast: Secondary | ICD-10-CM | POA: Diagnosis not present

## 2023-11-15 ENCOUNTER — Encounter (INDEPENDENT_AMBULATORY_CARE_PROVIDER_SITE_OTHER): Payer: Self-pay

## 2023-11-17 ENCOUNTER — Encounter (INDEPENDENT_AMBULATORY_CARE_PROVIDER_SITE_OTHER): Payer: Self-pay

## 2023-11-22 ENCOUNTER — Inpatient Hospital Stay: Payer: Medicare Other

## 2023-11-22 ENCOUNTER — Encounter (INDEPENDENT_AMBULATORY_CARE_PROVIDER_SITE_OTHER): Payer: Self-pay

## 2023-11-22 ENCOUNTER — Inpatient Hospital Stay: Payer: Medicare Other | Admitting: Hematology and Oncology

## 2023-11-29 ENCOUNTER — Inpatient Hospital Stay

## 2023-11-29 ENCOUNTER — Inpatient Hospital Stay: Attending: Adult Health

## 2023-11-29 ENCOUNTER — Inpatient Hospital Stay: Admitting: Adult Health

## 2023-11-29 ENCOUNTER — Encounter: Payer: Self-pay | Admitting: Adult Health

## 2023-11-29 VITALS — BP 137/87 | HR 99 | Resp 18 | Ht 63.0 in | Wt 153.9 lb

## 2023-11-29 DIAGNOSIS — M816 Localized osteoporosis [Lequesne]: Secondary | ICD-10-CM

## 2023-11-29 DIAGNOSIS — Z170 Estrogen receptor positive status [ER+]: Secondary | ICD-10-CM | POA: Insufficient documentation

## 2023-11-29 DIAGNOSIS — Z1732 Human epidermal growth factor receptor 2 negative status: Secondary | ICD-10-CM | POA: Insufficient documentation

## 2023-11-29 DIAGNOSIS — Z1722 Progesterone receptor negative status: Secondary | ICD-10-CM | POA: Insufficient documentation

## 2023-11-29 DIAGNOSIS — Z1239 Encounter for other screening for malignant neoplasm of breast: Secondary | ICD-10-CM | POA: Diagnosis not present

## 2023-11-29 DIAGNOSIS — C50211 Malignant neoplasm of upper-inner quadrant of right female breast: Secondary | ICD-10-CM | POA: Diagnosis not present

## 2023-11-29 DIAGNOSIS — E559 Vitamin D deficiency, unspecified: Secondary | ICD-10-CM

## 2023-11-29 DIAGNOSIS — Z9189 Other specified personal risk factors, not elsewhere classified: Secondary | ICD-10-CM

## 2023-11-29 DIAGNOSIS — M810 Age-related osteoporosis without current pathological fracture: Secondary | ICD-10-CM | POA: Insufficient documentation

## 2023-11-29 DIAGNOSIS — Z79899 Other long term (current) drug therapy: Secondary | ICD-10-CM | POA: Insufficient documentation

## 2023-11-29 LAB — CBC WITH DIFFERENTIAL (CANCER CENTER ONLY)
Abs Immature Granulocytes: 0.04 K/uL (ref 0.00–0.07)
Basophils Absolute: 0 K/uL (ref 0.0–0.1)
Basophils Relative: 0 %
Eosinophils Absolute: 0.2 K/uL (ref 0.0–0.5)
Eosinophils Relative: 2 %
HCT: 41.6 % (ref 36.0–46.0)
Hemoglobin: 14.2 g/dL (ref 12.0–15.0)
Immature Granulocytes: 0 %
Lymphocytes Relative: 12 %
Lymphs Abs: 1 K/uL (ref 0.7–4.0)
MCH: 29.6 pg (ref 26.0–34.0)
MCHC: 34.1 g/dL (ref 30.0–36.0)
MCV: 86.7 fL (ref 80.0–100.0)
Monocytes Absolute: 0.5 K/uL (ref 0.1–1.0)
Monocytes Relative: 6 %
Neutro Abs: 7.2 K/uL (ref 1.7–7.7)
Neutrophils Relative %: 80 %
Platelet Count: 268 K/uL (ref 150–400)
RBC: 4.8 MIL/uL (ref 3.87–5.11)
RDW: 13.1 % (ref 11.5–15.5)
WBC Count: 9.1 K/uL (ref 4.0–10.5)
nRBC: 0 % (ref 0.0–0.2)

## 2023-11-29 LAB — CMP (CANCER CENTER ONLY)
ALT: 14 U/L (ref 0–44)
AST: 18 U/L (ref 15–41)
Albumin: 4.6 g/dL (ref 3.5–5.0)
Alkaline Phosphatase: 53 U/L (ref 38–126)
Anion gap: 6 (ref 5–15)
BUN: 24 mg/dL — ABNORMAL HIGH (ref 8–23)
CO2: 29 mmol/L (ref 22–32)
Calcium: 9.6 mg/dL (ref 8.9–10.3)
Chloride: 104 mmol/L (ref 98–111)
Creatinine: 0.71 mg/dL (ref 0.44–1.00)
GFR, Estimated: >60 mL/min (ref >60)
Glucose, Bld: 100 mg/dL — ABNORMAL HIGH (ref 70–99)
Potassium: 4 mmol/L (ref 3.5–5.1)
Sodium: 139 mmol/L (ref 135–145)
Total Bilirubin: 0.5 mg/dL (ref 0.0–1.2)
Total Protein: 7 g/dL (ref 6.5–8.1)

## 2023-11-29 LAB — VITAMIN D 25 HYDROXY (VIT D DEFICIENCY, FRACTURES): Vit D, 25-Hydroxy: 34.9 ng/mL (ref 30–100)

## 2023-11-29 LAB — GENETIC SCREENING ORDER

## 2023-11-29 MED ORDER — DENOSUMAB 60 MG/ML ~~LOC~~ SOSY
60.0000 mg | PREFILLED_SYRINGE | Freq: Once | SUBCUTANEOUS | Status: AC
Start: 2023-11-29 — End: 2023-11-29
  Administered 2023-11-29 (×2): 60 mg via SUBCUTANEOUS
  Filled 2023-11-29: qty 1

## 2023-11-29 MED ORDER — SODIUM CHLORIDE 0.9 % IV SOLN
Freq: Once | INTRAVENOUS | Status: DC
Start: 2023-11-29 — End: 2023-11-29

## 2023-11-29 NOTE — Progress Notes (Signed)
 [facility] Cancer Follow up:    Katrinka Garnette KIDD, MD [address] [place] [address]   DIAGNOSIS: Cancer Staging  Breast cancer of upper-inner quadrant of right female breast [facility]) Staging form: Breast, AJCC 8th Edition - Clinical stage from 11/01/2018: Stage IA (rcT1c, rcN0, rcM0, G2, ER+, PR-, HER2-) - Unsigned Stage prefix: Recurrence Histologic grading system: 3 grade system - Pathologic stage from 11/30/2018: Stage IIA (pT2, pN0, cM0, G3, ER+, PR-, HER2-) - Signed by Crawford Morna Pickle, NP on 12/14/2018 Stage prefix: Initial diagnosis Histologic grading system: 3 grade system    SUMMARY OF ONCOLOGIC HISTORY: Oncology History  Breast cancer of upper-inner quadrant of right female breast (HCC)  12/05/2015 Mammogram   Right breast mass 2:00 4 cm from nipple: 1.7 cm, T1c N0 stage IA; right breast 2:00 6 cm from nipple 1.3 cm mass, 7 mm satellite nodule between the 2 masses, T1 cN0 stage IA   12/06/2015 Initial Diagnosis   Right breast biopsy 2:00 6 cm from nipple: Grade 2-3 invasive ductal cancer, ER 90%, PR 2%, Ki-67 80%, HER-2 positive ratio 2.07; right biopsy 2:00 4 cm from nipple: IDC with DCIS, ER 95%, PR 10%, Ki-67 90%, HER-2 negative ratio 1.27   12/18/2015 Breast MRI   3 enhancing masses in the upper inner quadrant right breast spanning an area 3.7 cm (1.7 cm, 1 cm, 1.4 cm) no lymph node enlargement   12/31/2015 - 04/15/2016 Neo-Adjuvant Chemotherapy   TCH Perjeta  6 cycles followed by Herceptin , Perjeta  maintenance for 1 year completed 10/20/2016   01/08/2016 - 01/12/2016 Hospital Admission   Neutropenic fever hospitalization (because patient did not receive Neulasta  with cycle 1)   01/10/2016 Miscellaneous   Genetic testing was normal did not reveal any mutations   04/16/2016 Breast MRI   Near CR to therapy. Previous 2 biopsied massses are no longer seen. Third satellite lesion is smaller from 10 mm to 5.3 mm, no abnormal LN.   05/19/2016  Surgery   Right mastectomy: IDC grade 3, 1.3 cm, low-grade DCIS, LVIDS present, margins negative, 0/1 lymph node negative, ypT1CypN0 stage IA; repeat HER-2 negative ratio 1.41   05/19/2016 Surgery   Right breast reconstruction with tissue expander. Acellular dermis for breast reconstruction (Dr.Thimappa)    06/22/2016 - 10/2018 Anti-estrogen oral therapy   Letrozole  2.5 mg daily   11/01/2018 Relapse/Recurrence   Patient palpated lump in the UIQ at surgical scar of reconstructed right breast. US  of the right breast showed a 1.8cm mass suspicious for recurrent breast cancer. Biopsy showed grade 2 invasive ductal carcinoma, HER-2 negative (1+), ER 90%, PR negative, Ki67 40%.   11/11/2018 Oncotype testing   Oncotype score 52: Greater than 39% risk of distant recurrence with hormone therapy alone, chemo benefit greater than 15%   11/30/2018 Surgery   Right lumpectomy Viktoria): IDC, grade 3, 2.1cm, perineural invasion present, carcinoma present focally at the posterior margin and involving skeletal muscle.   11/30/2018 Cancer Staging   Staging form: Breast, AJCC 8th Edition - Pathologic stage from 11/30/2018: Stage IIA (pT2, pN0, cM0, G3, ER+, PR-, HER2-) - Signed by Crawford Morna Pickle, NP on 12/14/2018   12/23/2018 - 04/07/2019 Chemotherapy   Taxotere  and Cytoxan  x6 cycles   05/18/2019 - 06/29/2019 Radiation Therapy   Adjuvant radiation   07/2019 -  Anti-estrogen oral therapy   Anastrozole  daily   09/2019 - 09/2021 Chemotherapy   Abemaciclib  300 mg daily started, decrease to 150 mg daily 10/15/2019 due to abdominal pain  CURRENT THERAPY: anastrozole /prolia   INTERVAL HISTORY:  Discussed the use of AI scribe software for clinical note transcription with the patient, who gave verbal consent to proceed.  History of Present Illness Patricia Chandler is a 68 year old female with a history of breast cancer who presents for follow-up.  She continues on Anastrozole  with  good tolerance.    A recent breast MRI on June 28, 2023, shows no evidence of malignancy. She is scheduled for a screening mammogram on December 15, 2023. No new breast changes or concerns are present.   She expresses concerns about the MRI's ability to detect cancer in her pectoral muscle, as cancer cells were previously found there after her second bout with breast cancer. Her surgeon mentioned that not all cancerous tissue could be removed from the pectoral muscle, and radiation was used to address the remaining cells.  Her Signatera tests have been negative.     Patient Active Problem List   Diagnosis Date Noted   Aortic atherosclerosis (HCC) 09/25/2022   AC (acromioclavicular) arthritis 06/17/2022   Encounter for counseling 12/02/2021   Acquired absence of nipple 09/16/2021   Osteoporosis 05/07/2020   Snoring 01/23/2020   Insomnia 01/08/2020   Liver lesion 06/21/2018   Intra-abdominal abscess (HCC) 06/14/2018   Acute diverticulitis 06/14/2018   Acute bursitis of right shoulder 06/08/2017   Acute shoulder bursitis, left 03/08/2017   DDD (degenerative disc disease), cervical 10/08/2016   Hypokalemia 03/30/2016   Chemotherapy-induced thrombocytopenia 03/30/2016   Genetic testing 01/10/2016   History of sepsis 01/08/2016   GERD (gastroesophageal reflux disease) 01/08/2016   Breast cancer of upper-inner quadrant of right female breast (HCC) 12/18/2015   Family history of breast cancer    Internal hemorrhoid 01/26/2011    has no known allergies.  MEDICAL HISTORY: Past Medical History:  Diagnosis Date   Allergy 07/2023   chest  & nasal congestion   Anemia    due to chemo   Anxiety    Arthritis    Breast cancer (HCC) dx'd 2017   right   Cardiomyopathy (HCC)    mild, per cardiology note; due to Herceptin    Dental crowns present    Family history of adverse reaction to anesthesia    pt's daughter has hx. of post-op nausea   GERD (gastroesophageal reflux disease)     Heart murmur    History of breast cancer 2017   right   History of chemotherapy    finished chemo 04/15/2016   Personal history of chemotherapy    Personal history of radiation therapy    Right Breast Cancer   Port catheter in place 01/08/2016   Ongoing herceptin  and projeta until September 2018     Rash 08/04/2016   under right arm - states recent tick bite; to see MD 08/05/2016   Recurrent breast cancer, right (HCC) dx'd 10/2018   Runny nose 06/06/2016   clear drainage, per pt.    SURGICAL HISTORY: Past Surgical History:  Procedure Laterality Date   AUGMENTATION MAMMAPLASTY Left    BREAST BIOPSY Right 11/30/2018   Procedure: RIGHT BREAST CHEST WALL RECURRENCE EXCISION;  Surgeon: Ebbie Cough, MD;  Location: [facility] OR;  Service: General;  Laterality: Right;   BREAST ENHANCEMENT SURGERY Bilateral 2005   breast mass removal Right 11/30/2018   BREAST RECONSTRUCTION WITH PLACEMENT OF TISSUE EXPANDER AND FLEX HD (ACELLULAR HYDRATED DERMIS) Right 05/19/2016   Procedure: RIGHT BREAST RECONSTRUCTION WITH PLACEMENT OF TISSUE EXPANDER AND  ALLODERM.;  Surgeon: Earlis Ranks, MD;  Location: [facility];  Service: Plastics;  Laterality: Right;   COLONOSCOPY     COLONOSCOPY WITH PROPOFOL   10/02/2014   DEBRIDEMENT AND CLOSURE WOUND Right 06/08/2016   Procedure: DEBRIDEMENT RIGHT MASTECTOMY FLAP, TISSUE EXPANSION OF RIGHT CHEST;  Surgeon: Earlis Ranks, MD;  Location: [facility] OR;  Service: Plastics;  Laterality: Right;   MASTECTOMY Right    MASTOPEXY Left 08/11/2016   Procedure: LEFT BREAST MASTOPEXY;  Surgeon: Earlis Ranks, MD;  Location: [facility];  Service: Plastics;  Laterality: Left;   NIPPLE SPARING MASTECTOMY/SENTINAL LYMPH NODE BIOPSY/RECONSTRUCTION/PLACEMENT OF TISSUE EXPANDER Right 05/19/2016   Procedure: RIGHT NIPPLE SPARING MASTECTOMY WITH SENTINAL LYMPH NODE BIOPSY WITH BLUE DYE INJECTION;  Surgeon: Donnice Bury, MD;  Location: [facility];  Service: General;  Laterality: Right;   PORT-A-CATH REMOVAL Right 04/26/2019   Procedure: REMOVAL PORT-A-CATH;  Surgeon: Bury Donnice, MD;  Location: [facility];  Service: General;  Laterality: Right;   PORTACATH PLACEMENT N/A 12/30/2015   Procedure: INSERTION PORT-A-CATH WITH US ;  Surgeon: Donnice Bury, MD;  Location: [facility];  Service: General;  Laterality: N/A;   PORTACATH PLACEMENT N/A 11/30/2018   Procedure: INSERTION PORT-A-CATH WITH ULTRASOUND;  Surgeon: Bury Donnice, MD;  Location: [facility] OR;  Service: General;  Laterality: N/A;   REDUCTION MAMMAPLASTY     REMOVAL OF TISSUE EXPANDER AND PLACEMENT OF IMPLANT Right 08/11/2016   Procedure: REMOVAL OF RIGHT  TISSUE EXPANDER AND PLACEMENT OF IMPLANT;  Surgeon: Earlis Ranks, MD;  Location: [facility];  Service: Plastics;  Laterality: Right;    SOCIAL HISTORY: Social History   Socioeconomic History   Marital status: Married    Spouse name: Not on file   Number of children: 2   Years of education: Not on file   Highest education level: Bachelor's degree (e.g., BA, AB, BS)  Occupational History   Occupation: retired  Tobacco Use   Smoking status: Never   Smokeless tobacco: Never  Vaping Use   Vaping status: Never Used  Substance and Sexual Activity   Alcohol use: Yes    Alcohol/week: 3.0 standard drinks of alcohol    Types: 3 Glasses of wine per week    Comment: occasionally   Drug use: No   Sexual activity: Not on file  Other Topics Concern   Not on file  Social History Narrative   Married 1978 (Al). 2 girls 33 and 36 in 2018. Almarie (liz) lives in [place], [place] with 73 yo son (single right now). and [name] in [place], [place]--> now [place] with husband and 70 year old daughter (born with heart defect with missing valve and initially not closed VSD--still weighs heavily on family- likely will have to be repaired).       Al and her bought [employer] [place]- prior to breast cancer. Had been doing event design on hold.    Graduated from [place]  [employer]. [employer] prior.       Hobbies: gardening, decorating, sewing, enjoys creating   Social Drivers of Health   Financial Resource Strain: Low Risk  (10/11/2023)   Overall Financial Resource Strain (CARDIA)    Difficulty of Paying Living Expenses: Not hard at all  Food Insecurity: No Food Insecurity (10/11/2023)   Hunger Vital Sign    Worried About Running Out of Food in the Last Year: Never true    Ran Out of Food in the Last Year: Never true  Transportation Needs: No Transportation Needs (10/11/2023)  PRAPARE - Administrator, Civil Service (Medical): No    Lack of Transportation (Non-Medical): No  Physical Activity: Sufficiently Active (10/11/2023)   Exercise Vital Sign    Days of Exercise per Week: 5 days    Minutes of Exercise per Session: 40 min  Stress: No Stress Concern Present (10/11/2023)   [employer] of Occupational Health - Occupational Stress Questionnaire    Feeling of Stress: Only a little  Social Connections: Moderately Integrated (10/11/2023)   Social Connection and Isolation Panel    Frequency of Communication with Friends and Family: Three times a week    Frequency of Social Gatherings with Friends and Family: Once a week    Attends Religious Services: 1 to 4 times per year    Active Member of [employer] or Organizations: No    Attends Engineer, structural: Not on file    Marital Status: Married  Catering manager Violence: Not At Risk (10/12/2023)   Humiliation, Afraid, Rape, and Kick questionnaire    Fear of Current or Ex-Partner: No    Emotionally Abused: No    Physically Abused: No    Sexually Abused: No    FAMILY HISTORY: Family History  Problem Relation Age of Onset   Breast cancer Maternal Aunt        dx in her 80s   Cancer Maternal Aunt    Stroke Father        late 54s   Hypertension Father    Pulmonary fibrosis Mother     Vision loss Mother    Breast cancer Paternal Aunt 88   Cancer Paternal Grandmother        abdominal; cervical vs uterine dx in her 73s   Stomach cancer Maternal Grandmother    Parkinson's disease Maternal Grandfather    Anesthesia problems Daughter        post-op nausea   Colon cancer Neg Hx    Esophageal cancer Neg Hx    Rectal cancer Neg Hx     Review of Systems  Constitutional:  Negative for appetite change, chills, fatigue, fever and unexpected weight change.  HENT:   Negative for hearing loss, lump/mass and trouble swallowing.   Eyes:  Negative for eye problems and icterus.  Respiratory:  Negative for chest tightness, cough and shortness of breath.   Cardiovascular:  Negative for chest pain, leg swelling and palpitations.  Gastrointestinal:  Negative for abdominal distention, abdominal pain, constipation, diarrhea, nausea and vomiting.  Endocrine: Negative for hot flashes.  Genitourinary:  Negative for difficulty urinating.   Musculoskeletal:  Negative for arthralgias.  Skin:  Negative for itching and rash.  Neurological:  Negative for dizziness, extremity weakness, headaches and numbness.  Hematological:  Negative for adenopathy. Does not bruise/bleed easily.  Psychiatric/Behavioral:  Negative for depression. The patient is not nervous/anxious.       PHYSICAL EXAMINATION    Vitals:   11/29/23 1402  BP: 137/87  Pulse: 99  Resp: 18  SpO2: 100%    Physical Exam Constitutional:      General: She is not in acute distress.    Appearance: Normal appearance. She is not toxic-appearing.  HENT:     Head: Normocephalic and atraumatic.     Mouth/Throat:     Mouth: Mucous membranes are moist.     Pharynx: Oropharynx is clear. No oropharyngeal exudate or posterior oropharyngeal erythema.  Eyes:     General: No scleral icterus. Cardiovascular:     Rate and Rhythm: Normal rate and regular  rhythm.     Pulses: Normal pulses.     Heart sounds: Normal heart sounds.   Pulmonary:     Effort: Pulmonary effort is normal.     Breath sounds: Normal breath sounds.  Chest:     Comments: Right breast s/p mastectomy and reconstruction, no sign of local recurrence, left breast benign Abdominal:     General: Abdomen is flat. Bowel sounds are normal. There is no distension.     Palpations: Abdomen is soft.     Tenderness: There is no abdominal tenderness.  Musculoskeletal:        General: No swelling.     Cervical back: Neck supple.  Lymphadenopathy:     Cervical: No cervical adenopathy.     Upper Body:     Right upper body: No supraclavicular or axillary adenopathy.     Left upper body: No supraclavicular or axillary adenopathy.  Skin:    General: Skin is warm and dry.     Findings: No rash.  Neurological:     General: No focal deficit present.     Mental Status: She is alert.  Psychiatric:        Mood and Affect: Mood normal.        Behavior: Behavior normal.     LABORATORY DATA:  CBC    Component Value Date/Time   WBC 9.1 11/29/2023 1341   WBC 9.6 11/18/2018 0940   RBC 4.80 11/29/2023 1341   HGB 14.2 11/29/2023 1341   HGB 11.3 (L) 10/20/2016 0757   HCT 41.6 11/29/2023 1341   HCT 32.9 (L) 10/20/2016 0757   PLT 268 11/29/2023 1341   PLT 186 10/20/2016 0757   MCV 86.7 11/29/2023 1341   MCV 89.2 10/20/2016 0757   MCH 29.6 11/29/2023 1341   MCHC 34.1 11/29/2023 1341   RDW 13.1 11/29/2023 1341   RDW 14.2 10/20/2016 0757   LYMPHSABS 1.0 11/29/2023 1341   LYMPHSABS 2.0 10/20/2016 0757   MONOABS 0.5 11/29/2023 1341   MONOABS 0.4 10/20/2016 0757   EOSABS 0.2 11/29/2023 1341   EOSABS 0.1 10/20/2016 0757   BASOSABS 0.0 11/29/2023 1341   BASOSABS 0.0 10/20/2016 0757    CMP     Component Value Date/Time   NA 137 05/24/2023 1247   NA 140 10/20/2016 0757   K 3.8 05/24/2023 1247   K 3.9 10/20/2016 0757   CL 101 05/24/2023 1247   CO2 28 05/24/2023 1247   CO2 24 10/20/2016 0757   GLUCOSE 107 (H) 05/24/2023 1247   GLUCOSE 111 10/20/2016  0757   BUN 26 (H) 05/24/2023 1247   BUN 22.7 10/20/2016 0757   CREATININE 0.82 05/24/2023 1247   CREATININE 0.8 10/20/2016 0757   CALCIUM  10.4 (H) 05/24/2023 1247   CALCIUM  9.3 10/20/2016 0757   PROT 7.2 05/24/2023 1247   PROT 5.9 (L) 10/20/2016 0757   ALBUMIN  4.7 05/24/2023 1247   ALBUMIN  3.6 10/20/2016 0757   AST 18 05/24/2023 1247   AST 16 10/20/2016 0757   ALT 14 05/24/2023 1247   ALT 16 10/20/2016 0757   ALKPHOS 55 05/24/2023 1247   ALKPHOS 54 10/20/2016 0757   BILITOT 0.4 05/24/2023 1247   BILITOT 0.39 10/20/2016 0757   GFRNONAA >60 05/24/2023 1247   GFRAA >60 01/22/2020 1528     ASSESSMENT and THERAPY PLAN:   Assessment and Plan Assessment & Plan Recurrent stage 2A right breast invasive ductal carcinoma in 2020, status post lumpectomy, adjuvant chemotherapy, and adjuvant radiation. Recent breast MRI on June 28, 2023, showed no evidence of malignancy. Continues on anastrozole  therapy. Signatera testing has been negative, providing reassurance against recurrence. - Continue anastrozole  therapy. - Proceed with scheduled screening mammogram on December 15, 2023. - Ensure MRI orders include attention to pectoral muscle area. - Continue Signatera testing. - Schedule next breast MRI due in March 2026.  Osteoporosis, receiving Prolia  therapy Osteoporosis managed with Prolia  therapy. Previous vitamin D  levels were low, but she is supplementing and spending time outdoors. Bone density testing is due in January, with [place] as a suitable location for testing. - Administer Prolia  injection today. - Order bone density test at [facility] location in January. - Monitor vitamin D  levels.  RTC in 6 months for labs, f/u, and next injection.   All questions were answered. The patient knows to call the clinic with any problems, questions or concerns. We can certainly see the patient much sooner if necessary.  Total encounter time:30 minutes*in face-to-face visit time, chart review, lab  review, care coordination, order entry, and documentation of the encounter time.    Morna Kendall, NP 11/29/23 2:23 PM Medical Oncology and Hematology [facility] [address] [place], [place] [address] Tel. [phone]    Fax. [phone]  *Total Encounter Time as defined by the Centers for Medicare and Medicaid Services includes, in addition to the face-to-face time of a patient visit (documented in the note above) non-face-to-face time: obtaining and reviewing outside history, ordering and reviewing medications, tests or procedures, care coordination (communications with other health care professionals or caregivers) and documentation in the medical record.

## 2023-11-30 ENCOUNTER — Ambulatory Visit: Payer: Self-pay | Admitting: Family Medicine

## 2023-11-30 ENCOUNTER — Encounter: Payer: Self-pay | Admitting: Adult Health

## 2023-11-30 ENCOUNTER — Other Ambulatory Visit: Payer: Self-pay | Admitting: *Deleted

## 2023-11-30 DIAGNOSIS — Z170 Estrogen receptor positive status [ER+]: Secondary | ICD-10-CM

## 2023-11-30 LAB — LIPID PANEL
Cholesterol: 179 mg/dL (ref 0–200)
HDL: 92 mg/dL (ref >40)
LDL Cholesterol: 72 mg/dL (ref 0–99)
Total CHOL/HDL Ratio: 1.9 ratio
Triglycerides: 73 mg/dL (ref <150)
VLDL: 15 mg/dL (ref 0–40)

## 2023-12-01 LAB — HEMOGLOBIN A1C
Hgb A1c MFr Bld: 5.6 % (ref 4.8–5.6)
Mean Plasma Glucose: 114 mg/dL

## 2023-12-10 LAB — SIGNATERA
SIGNATERA MTM READOUT: 0 MTM/ml
SIGNATERA TEST RESULT: NEGATIVE

## 2023-12-15 ENCOUNTER — Ambulatory Visit
Admission: RE | Admit: 2023-12-15 | Discharge: 2023-12-15 | Disposition: A | Discharge status: Home / Self Care | Source: Ambulatory Visit | Attending: Adult Health | Admitting: Adult Health

## 2023-12-15 DIAGNOSIS — Z1231 Encounter for screening mammogram for malignant neoplasm of breast: Secondary | ICD-10-CM

## 2024-01-21 DIAGNOSIS — H52223 Regular astigmatism, bilateral: Secondary | ICD-10-CM | POA: Diagnosis not present

## 2024-01-25 ENCOUNTER — Other Ambulatory Visit: Payer: Self-pay | Admitting: Hematology and Oncology

## 2024-02-09 ENCOUNTER — Encounter: Payer: Self-pay | Admitting: Adult Health

## 2024-03-24 ENCOUNTER — Other Ambulatory Visit: Payer: Self-pay

## 2024-03-24 ENCOUNTER — Other Ambulatory Visit: Payer: Self-pay | Admitting: Family Medicine

## 2024-03-24 MED ORDER — ZOLPIDEM TARTRATE 5 MG PO TABS: 2.5000 mg | ORAL_TABLET | Freq: Every evening | ORAL | 2 refills | Status: AC | PRN

## 2024-03-24 NOTE — Telephone Encounter (Signed)
 Last OV: 09/29/2023  Next OV: 10/03/2024  Last Filled: 09/29/2023  Quaniuty: 30/2

## 2024-04-25 ENCOUNTER — Encounter: Payer: Self-pay | Admitting: Adult Health

## 2024-05-04 ENCOUNTER — Telehealth: Payer: Self-pay

## 2024-05-04 ENCOUNTER — Encounter: Payer: Self-pay | Admitting: Adult Health

## 2024-05-04 ENCOUNTER — Other Ambulatory Visit: Payer: Self-pay

## 2024-05-04 NOTE — Telephone Encounter (Signed)
 Canceled DEXA scan scheduled for 1/27  Patient scheduled for an earlier appointment with [facility] instead

## 2024-05-05 LAB — HM DEXA SCAN

## 2024-05-08 ENCOUNTER — Ambulatory Visit: Payer: Self-pay | Admitting: Family Medicine

## 2024-05-08 ENCOUNTER — Encounter: Payer: Self-pay | Admitting: Family Medicine

## 2024-05-12 ENCOUNTER — Telehealth: Payer: Self-pay | Admitting: Family Medicine

## 2024-05-12 NOTE — Telephone Encounter (Signed)
 LVM to reschedule pt's 10/03/24 physical as the provider will no longer be in office that day

## 2024-05-16 ENCOUNTER — Other Ambulatory Visit

## 2024-05-24 ENCOUNTER — Encounter: Payer: Self-pay | Admitting: Adult Health

## 2024-05-24 ENCOUNTER — Inpatient Hospital Stay: Payer: Medicare Other

## 2024-05-24 ENCOUNTER — Other Ambulatory Visit: Payer: Self-pay | Admitting: *Deleted

## 2024-05-24 ENCOUNTER — Inpatient Hospital Stay: Payer: Medicare Other | Admitting: Adult Health

## 2024-05-24 VITALS — BP 126/86 | HR 98 | Temp 97.7°F | Resp 17 | Wt 147.5 lb

## 2024-05-24 DIAGNOSIS — Z1239 Encounter for other screening for malignant neoplasm of breast: Secondary | ICD-10-CM

## 2024-05-24 DIAGNOSIS — M816 Localized osteoporosis [Lequesne]: Secondary | ICD-10-CM

## 2024-05-24 DIAGNOSIS — C50211 Malignant neoplasm of upper-inner quadrant of right female breast: Secondary | ICD-10-CM | POA: Diagnosis not present

## 2024-05-24 DIAGNOSIS — Z170 Estrogen receptor positive status [ER+]: Secondary | ICD-10-CM

## 2024-05-24 LAB — CBC WITH DIFFERENTIAL (CANCER CENTER ONLY)
Abs Immature Granulocytes: 0.04 10*3/uL (ref 0.00–0.07)
Basophils Absolute: 0 10*3/uL (ref 0.0–0.1)
Basophils Relative: 1 %
Eosinophils Absolute: 0.2 10*3/uL (ref 0.0–0.5)
Eosinophils Relative: 3 %
HCT: 41.6 % (ref 36.0–46.0)
Hemoglobin: 14.1 g/dL (ref 12.0–15.0)
Immature Granulocytes: 1 %
Lymphocytes Relative: 11 %
Lymphs Abs: 1 10*3/uL (ref 0.7–4.0)
MCH: 29.1 pg (ref 26.0–34.0)
MCHC: 33.9 g/dL (ref 30.0–36.0)
MCV: 86 fL (ref 80.0–100.0)
Monocytes Absolute: 0.6 10*3/uL (ref 0.1–1.0)
Monocytes Relative: 7 %
Neutro Abs: 6.5 10*3/uL (ref 1.7–7.7)
Neutrophils Relative %: 77 %
Platelet Count: 263 10*3/uL (ref 150–400)
RBC: 4.84 MIL/uL (ref 3.87–5.11)
RDW: 13.2 % (ref 11.5–15.5)
WBC Count: 8.4 10*3/uL (ref 4.0–10.5)
nRBC: 0 % (ref 0.0–0.2)

## 2024-05-24 LAB — CMP (CANCER CENTER ONLY)
ALT: 19 U/L (ref 0–44)
AST: 25 U/L (ref 15–41)
Albumin: 4.7 g/dL (ref 3.5–5.0)
Alkaline Phosphatase: 58 U/L (ref 38–126)
Anion gap: 12 (ref 5–15)
BUN: 22 mg/dL (ref 8–23)
CO2: 24 mmol/L (ref 22–32)
Calcium: 9.7 mg/dL (ref 8.9–10.3)
Chloride: 103 mmol/L (ref 98–111)
Creatinine: 0.75 mg/dL (ref 0.44–1.00)
GFR, Estimated: >60 mL/min
Glucose, Bld: 78 mg/dL (ref 70–99)
Potassium: 3.6 mmol/L (ref 3.5–5.1)
Sodium: 139 mmol/L (ref 135–145)
Total Bilirubin: 0.4 mg/dL (ref 0.0–1.2)
Total Protein: 7.3 g/dL (ref 6.5–8.1)

## 2024-05-24 LAB — GENETIC SCREENING ORDER

## 2024-05-24 LAB — VITAMIN D 25 HYDROXY (VIT D DEFICIENCY, FRACTURES): Vit D, 25-Hydroxy: 41.2 ng/mL (ref 30–100)

## 2024-05-24 MED ORDER — DENOSUMAB 60 MG/ML ~~LOC~~ SOSY
60.0000 mg | PREFILLED_SYRINGE | Freq: Once | SUBCUTANEOUS | Status: DC
  Administered 2024-05-24: 60 mg via SUBCUTANEOUS

## 2024-06-27 ENCOUNTER — Other Ambulatory Visit

## 2024-10-03 ENCOUNTER — Encounter: Admitting: Family Medicine

## 2024-10-16 ENCOUNTER — Encounter: Admitting: Family Medicine

## 2024-10-17 ENCOUNTER — Ambulatory Visit

## 2024-11-22 ENCOUNTER — Inpatient Hospital Stay: Admitting: Adult Health

## 2024-11-22 ENCOUNTER — Inpatient Hospital Stay
